# Patient Record
Sex: Male | Born: 1950 | Race: White | Hispanic: No | Smoking: Former smoker
Health system: Southern US, Community
[De-identification: ages and names within clinical notes are randomized; demographics above are authoritative.]

## PROBLEM LIST (undated history)

## (undated) DIAGNOSIS — I441 Atrioventricular block, second degree: Secondary | ICD-10-CM

## (undated) DIAGNOSIS — I779 Disorder of arteries and arterioles, unspecified: Secondary | ICD-10-CM

## (undated) DIAGNOSIS — E785 Hyperlipidemia, unspecified: Secondary | ICD-10-CM

## (undated) DIAGNOSIS — I739 Peripheral vascular disease, unspecified: Secondary | ICD-10-CM

## (undated) DIAGNOSIS — F419 Anxiety disorder, unspecified: Secondary | ICD-10-CM

## (undated) DIAGNOSIS — H269 Unspecified cataract: Secondary | ICD-10-CM

## (undated) DIAGNOSIS — R0602 Shortness of breath: Secondary | ICD-10-CM

## (undated) DIAGNOSIS — I10 Essential (primary) hypertension: Secondary | ICD-10-CM

## (undated) DIAGNOSIS — Z9289 Personal history of other medical treatment: Secondary | ICD-10-CM

## (undated) DIAGNOSIS — M545 Low back pain, unspecified: Secondary | ICD-10-CM

## (undated) DIAGNOSIS — M1990 Unspecified osteoarthritis, unspecified site: Secondary | ICD-10-CM

## (undated) DIAGNOSIS — G5712 Meralgia paresthetica, left lower limb: Secondary | ICD-10-CM

## (undated) DIAGNOSIS — I771 Stricture of artery: Secondary | ICD-10-CM

## (undated) DIAGNOSIS — K219 Gastro-esophageal reflux disease without esophagitis: Secondary | ICD-10-CM

## (undated) DIAGNOSIS — Z860100 Personal history of colon polyps, unspecified: Secondary | ICD-10-CM

## (undated) DIAGNOSIS — I2510 Atherosclerotic heart disease of native coronary artery without angina pectoris: Secondary | ICD-10-CM

## (undated) DIAGNOSIS — G4730 Sleep apnea, unspecified: Secondary | ICD-10-CM

## (undated) DIAGNOSIS — J449 Chronic obstructive pulmonary disease, unspecified: Secondary | ICD-10-CM

## (undated) HISTORY — DX: Personal history of colonic polyps: Z86.010

## (undated) HISTORY — DX: Low back pain, unspecified: M54.50

## (undated) HISTORY — DX: Low back pain: M54.5

## (undated) HISTORY — DX: Meralgia paresthetica, left lower limb: G57.12

## (undated) HISTORY — DX: Personal history of colon polyps, unspecified: Z86.0100

## (undated) HISTORY — DX: Hyperlipidemia, unspecified: E78.5

## (undated) HISTORY — DX: Atherosclerotic heart disease of native coronary artery without angina pectoris: I25.10

## (undated) HISTORY — DX: Unspecified cataract: H26.9

## (undated) HISTORY — DX: Chronic obstructive pulmonary disease, unspecified: J44.9

## (undated) HISTORY — DX: Sleep apnea, unspecified: G47.30

## (undated) HISTORY — DX: Peripheral vascular disease, unspecified: I73.9

## (undated) HISTORY — DX: Disorder of arteries and arterioles, unspecified: I77.9

## (undated) HISTORY — DX: Personal history of other medical treatment: Z92.89

## (undated) HISTORY — PX: TONSILLECTOMY: SUR1361

## (undated) HISTORY — DX: Gastro-esophageal reflux disease without esophagitis: K21.9

## (undated) HISTORY — PX: OTHER SURGICAL HISTORY: SHX169

## (undated) HISTORY — DX: Essential (primary) hypertension: I10

## (undated) HISTORY — DX: Stricture of artery: I77.1

---

## 1998-03-11 ENCOUNTER — Inpatient Hospital Stay (HOSPITAL_COMMUNITY): Admission: AD | Admit: 1998-03-11 | Discharge: 1998-03-13 | Payer: Self-pay | Admitting: *Deleted

## 1998-05-14 ENCOUNTER — Observation Stay (HOSPITAL_COMMUNITY): Admission: EM | Admit: 1998-05-14 | Discharge: 1998-05-15 | Payer: Self-pay | Admitting: *Deleted

## 1999-06-01 ENCOUNTER — Emergency Department (HOSPITAL_COMMUNITY): Admission: EM | Admit: 1999-06-01 | Discharge: 1999-06-01 | Payer: Self-pay | Admitting: Emergency Medicine

## 2000-08-15 ENCOUNTER — Encounter (INDEPENDENT_AMBULATORY_CARE_PROVIDER_SITE_OTHER): Payer: Self-pay | Admitting: Specialist

## 2000-08-15 ENCOUNTER — Other Ambulatory Visit: Admission: RE | Admit: 2000-08-15 | Discharge: 2000-08-15 | Payer: Self-pay | Admitting: Gastroenterology

## 2001-04-03 ENCOUNTER — Inpatient Hospital Stay (HOSPITAL_COMMUNITY): Admission: EM | Admit: 2001-04-03 | Discharge: 2001-04-05 | Payer: Self-pay | Admitting: Emergency Medicine

## 2001-04-03 ENCOUNTER — Encounter: Payer: Self-pay | Admitting: Emergency Medicine

## 2001-04-05 ENCOUNTER — Encounter: Payer: Self-pay | Admitting: Internal Medicine

## 2003-05-05 ENCOUNTER — Emergency Department (HOSPITAL_COMMUNITY): Admission: EM | Admit: 2003-05-05 | Discharge: 2003-05-05 | Payer: Self-pay | Admitting: Emergency Medicine

## 2003-05-09 ENCOUNTER — Ambulatory Visit (HOSPITAL_COMMUNITY): Admission: RE | Admit: 2003-05-09 | Discharge: 2003-05-09 | Payer: Self-pay

## 2004-10-29 ENCOUNTER — Ambulatory Visit: Payer: Self-pay | Admitting: Internal Medicine

## 2005-02-15 ENCOUNTER — Ambulatory Visit: Payer: Self-pay | Admitting: Internal Medicine

## 2005-09-16 ENCOUNTER — Emergency Department (HOSPITAL_COMMUNITY): Admission: EM | Admit: 2005-09-16 | Discharge: 2005-09-17 | Payer: Self-pay | Admitting: Emergency Medicine

## 2005-11-16 ENCOUNTER — Ambulatory Visit: Payer: Self-pay | Admitting: Internal Medicine

## 2006-03-04 ENCOUNTER — Emergency Department (HOSPITAL_COMMUNITY): Admission: EM | Admit: 2006-03-04 | Discharge: 2006-03-04 | Payer: Self-pay | Admitting: Emergency Medicine

## 2006-03-06 ENCOUNTER — Ambulatory Visit: Payer: Self-pay | Admitting: Internal Medicine

## 2006-04-24 ENCOUNTER — Emergency Department (HOSPITAL_COMMUNITY): Admission: EM | Admit: 2006-04-24 | Discharge: 2006-04-24 | Payer: Self-pay | Admitting: Emergency Medicine

## 2006-09-25 ENCOUNTER — Ambulatory Visit: Payer: Self-pay | Admitting: Internal Medicine

## 2006-09-25 LAB — CONVERTED CEMR LAB
ALT: 27 units/L (ref 0–40)
BUN: 13 mg/dL (ref 6–23)
Basophils Absolute: 0 10*3/uL (ref 0.0–0.1)
Basophils Relative: 0.2 % (ref 0.0–1.0)
CO2: 28 meq/L (ref 19–32)
Cholesterol: 166 mg/dL (ref 0–200)
Creatinine, Ser: 0.9 mg/dL (ref 0.4–1.5)
Epithelial cells, urine: NEGATIVE /lpf
Glomerular Filtration Rate, Af Am: 113 mL/min/{1.73_m2}
Glucose, Bld: 101 mg/dL — ABNORMAL HIGH (ref 70–99)
HCT: 51.3 % (ref 39.0–52.0)
Hemoglobin, Urine: NEGATIVE
Hemoglobin: 17.4 g/dL — ABNORMAL HIGH (ref 13.0–17.0)
LDL Cholesterol: 111 mg/dL — ABNORMAL HIGH (ref 0–99)
Lymphocytes Relative: 29.1 % (ref 12.0–46.0)
MCV: 99.9 fL (ref 78.0–100.0)
Monocytes Absolute: 0.5 10*3/uL (ref 0.2–0.7)
Mucus, UA: NEGATIVE
Neutro Abs: 4.9 10*3/uL (ref 1.4–7.7)
Potassium: 4.6 meq/L (ref 3.5–5.1)
RBC / HPF: NONE SEEN
RDW: 12.7 % (ref 11.5–14.6)
Sodium: 140 meq/L (ref 135–145)
Specific Gravity, Urine: 1.02 (ref 1.000–1.03)
TSH: 1.47 microintl units/mL (ref 0.35–5.50)
Total Bilirubin: 1.4 mg/dL — ABNORMAL HIGH (ref 0.3–1.2)
Total Protein, Urine: NEGATIVE mg/dL
Total Protein: 7.2 g/dL (ref 6.0–8.3)
Triglyceride fasting, serum: 118 mg/dL (ref 0–149)
Urobilinogen, UA: 0.2 (ref 0.0–1.0)

## 2006-09-29 ENCOUNTER — Ambulatory Visit: Payer: Self-pay | Admitting: Internal Medicine

## 2006-10-02 ENCOUNTER — Ambulatory Visit (HOSPITAL_COMMUNITY): Admission: RE | Admit: 2006-10-02 | Discharge: 2006-10-02 | Payer: Self-pay | Admitting: Internal Medicine

## 2006-12-08 ENCOUNTER — Ambulatory Visit: Payer: Self-pay | Admitting: Internal Medicine

## 2007-01-11 ENCOUNTER — Ambulatory Visit: Payer: Self-pay | Admitting: Internal Medicine

## 2007-01-17 ENCOUNTER — Ambulatory Visit: Payer: Self-pay | Admitting: Internal Medicine

## 2007-01-22 ENCOUNTER — Ambulatory Visit: Payer: Self-pay | Admitting: Internal Medicine

## 2007-01-25 ENCOUNTER — Ambulatory Visit: Payer: Self-pay | Admitting: Gastroenterology

## 2007-02-08 ENCOUNTER — Encounter (INDEPENDENT_AMBULATORY_CARE_PROVIDER_SITE_OTHER): Payer: Self-pay | Admitting: *Deleted

## 2007-02-08 ENCOUNTER — Ambulatory Visit: Payer: Self-pay | Admitting: Gastroenterology

## 2007-02-08 LAB — HM COLONOSCOPY

## 2007-02-13 ENCOUNTER — Ambulatory Visit: Payer: Self-pay | Admitting: Internal Medicine

## 2007-02-23 ENCOUNTER — Ambulatory Visit: Payer: Self-pay

## 2007-03-16 ENCOUNTER — Ambulatory Visit: Payer: Self-pay | Admitting: Cardiology

## 2007-03-16 LAB — CONVERTED CEMR LAB
BUN: 16 mg/dL (ref 6–23)
Calcium: 9.6 mg/dL (ref 8.4–10.5)
Eosinophils Absolute: 0.3 10*3/uL (ref 0.0–0.6)
Eosinophils Relative: 2.9 % (ref 0.0–5.0)
GFR calc Af Amer: 150 mL/min
GFR calc non Af Amer: 124 mL/min
Lymphocytes Relative: 33.6 % (ref 12.0–46.0)
MCHC: 35.5 g/dL (ref 30.0–36.0)
Monocytes Absolute: 0.8 10*3/uL — ABNORMAL HIGH (ref 0.2–0.7)
Monocytes Relative: 8.8 % (ref 3.0–11.0)
Neutrophils Relative %: 53.8 % (ref 43.0–77.0)
Platelets: 307 10*3/uL (ref 150–400)
Potassium: 4.1 meq/L (ref 3.5–5.1)
Prothrombin Time: 11.7 s (ref 10.0–14.0)
RBC: 4.86 M/uL (ref 4.22–5.81)
RDW: 13.8 % (ref 11.5–14.6)
Sodium: 144 meq/L (ref 135–145)
aPTT: 29.1 s (ref 26.5–36.5)

## 2007-03-21 ENCOUNTER — Ambulatory Visit: Payer: Self-pay | Admitting: Cardiology

## 2007-03-21 ENCOUNTER — Observation Stay (HOSPITAL_COMMUNITY): Admission: RE | Admit: 2007-03-21 | Discharge: 2007-03-22 | Payer: Self-pay | Admitting: Cardiology

## 2007-03-22 ENCOUNTER — Ambulatory Visit: Payer: Self-pay | Admitting: Vascular Surgery

## 2007-03-26 ENCOUNTER — Ambulatory Visit: Payer: Self-pay

## 2007-03-26 ENCOUNTER — Ambulatory Visit: Payer: Self-pay | Admitting: Cardiology

## 2007-07-03 ENCOUNTER — Encounter: Payer: Self-pay | Admitting: Internal Medicine

## 2007-07-03 DIAGNOSIS — Z860100 Personal history of colon polyps, unspecified: Secondary | ICD-10-CM | POA: Insufficient documentation

## 2007-07-03 DIAGNOSIS — F528 Other sexual dysfunction not due to a substance or known physiological condition: Secondary | ICD-10-CM | POA: Insufficient documentation

## 2007-07-03 DIAGNOSIS — R0600 Dyspnea, unspecified: Secondary | ICD-10-CM | POA: Insufficient documentation

## 2007-07-03 DIAGNOSIS — I129 Hypertensive chronic kidney disease with stage 1 through stage 4 chronic kidney disease, or unspecified chronic kidney disease: Secondary | ICD-10-CM | POA: Insufficient documentation

## 2007-07-03 DIAGNOSIS — I2510 Atherosclerotic heart disease of native coronary artery without angina pectoris: Secondary | ICD-10-CM | POA: Insufficient documentation

## 2007-07-03 DIAGNOSIS — I70219 Atherosclerosis of native arteries of extremities with intermittent claudication, unspecified extremity: Secondary | ICD-10-CM | POA: Insufficient documentation

## 2007-07-03 DIAGNOSIS — I10 Essential (primary) hypertension: Secondary | ICD-10-CM | POA: Insufficient documentation

## 2007-09-26 ENCOUNTER — Ambulatory Visit: Payer: Self-pay

## 2008-02-12 ENCOUNTER — Emergency Department (HOSPITAL_COMMUNITY): Admission: EM | Admit: 2008-02-12 | Discharge: 2008-02-12 | Payer: Self-pay | Admitting: Emergency Medicine

## 2008-03-10 ENCOUNTER — Ambulatory Visit: Payer: Self-pay | Admitting: Internal Medicine

## 2008-03-10 DIAGNOSIS — F17200 Nicotine dependence, unspecified, uncomplicated: Secondary | ICD-10-CM | POA: Insufficient documentation

## 2008-03-10 DIAGNOSIS — R5382 Chronic fatigue, unspecified: Secondary | ICD-10-CM | POA: Insufficient documentation

## 2008-03-10 DIAGNOSIS — R5383 Other fatigue: Secondary | ICD-10-CM | POA: Insufficient documentation

## 2008-04-18 ENCOUNTER — Telehealth: Payer: Self-pay | Admitting: Internal Medicine

## 2008-05-01 ENCOUNTER — Ambulatory Visit: Payer: Self-pay | Admitting: Internal Medicine

## 2008-05-01 LAB — CONVERTED CEMR LAB
ALT: 40 units/L (ref 0–53)
AST: 34 units/L (ref 0–37)
BUN: 9 mg/dL (ref 6–23)
Basophils Absolute: 0.1 10*3/uL (ref 0.0–0.1)
Basophils Relative: 0.7 % (ref 0.0–1.0)
Bilirubin Urine: NEGATIVE
CO2: 26 meq/L (ref 19–32)
Chloride: 103 meq/L (ref 96–112)
Cholesterol: 243 mg/dL (ref 0–200)
Creatinine, Ser: 0.8 mg/dL (ref 0.4–1.5)
Eosinophils Absolute: 0.3 10*3/uL (ref 0.0–0.7)
Eosinophils Relative: 3.6 % (ref 0.0–5.0)
Glucose, Bld: 89 mg/dL (ref 70–99)
HCT: 51.5 % (ref 39.0–52.0)
HDL: 29.1 mg/dL — ABNORMAL LOW (ref >39.0)
Hgb A1c MFr Bld: 6 % (ref 4.6–6.0)
Ketones, ur: NEGATIVE mg/dL
Lymphocytes Relative: 33 % (ref 12.0–46.0)
MCHC: 35 g/dL (ref 30.0–36.0)
Neutro Abs: 4.4 10*3/uL (ref 1.4–7.7)
Nitrite: NEGATIVE
Total Bilirubin: 0.9 mg/dL (ref 0.3–1.2)
Total Protein, Urine: NEGATIVE mg/dL
Total Protein: 7.2 g/dL (ref 6.0–8.3)
Urine Glucose: NEGATIVE mg/dL
Urobilinogen, UA: 0.2 (ref 0.0–1.0)
VLDL: 86 mg/dL — ABNORMAL HIGH (ref 0–40)
WBC: 8 10*3/uL (ref 4.5–10.5)
pH: 5.5 (ref 5.0–8.0)

## 2008-05-05 LAB — CONVERTED CEMR LAB
HCV Ab: NEGATIVE
Hep B S Ab: NEGATIVE
Vit D, 1,25-Dihydroxy: 24 — ABNORMAL LOW (ref 30–89)

## 2008-06-24 ENCOUNTER — Ambulatory Visit: Payer: Self-pay | Admitting: Internal Medicine

## 2008-06-24 DIAGNOSIS — B351 Tinea unguium: Secondary | ICD-10-CM | POA: Insufficient documentation

## 2008-06-24 DIAGNOSIS — E559 Vitamin D deficiency, unspecified: Secondary | ICD-10-CM | POA: Insufficient documentation

## 2008-12-08 ENCOUNTER — Telehealth: Payer: Self-pay | Admitting: Internal Medicine

## 2009-11-28 DIAGNOSIS — G5712 Meralgia paresthetica, left lower limb: Secondary | ICD-10-CM

## 2009-11-28 HISTORY — DX: Meralgia paresthetica, left lower limb: G57.12

## 2010-01-06 ENCOUNTER — Encounter (INDEPENDENT_AMBULATORY_CARE_PROVIDER_SITE_OTHER): Payer: Self-pay | Admitting: *Deleted

## 2010-01-12 ENCOUNTER — Encounter: Payer: Self-pay | Admitting: Internal Medicine

## 2010-01-12 ENCOUNTER — Telehealth: Payer: Self-pay | Admitting: Internal Medicine

## 2010-01-13 ENCOUNTER — Ambulatory Visit: Payer: Self-pay | Admitting: Internal Medicine

## 2010-01-13 DIAGNOSIS — R42 Dizziness and giddiness: Secondary | ICD-10-CM | POA: Insufficient documentation

## 2010-02-08 ENCOUNTER — Encounter: Payer: Self-pay | Admitting: Internal Medicine

## 2010-02-11 ENCOUNTER — Ambulatory Visit: Payer: Self-pay | Admitting: Internal Medicine

## 2010-02-11 LAB — CONVERTED CEMR LAB
AST: 24 units/L (ref 0–37)
Albumin: 3.8 g/dL (ref 3.5–5.2)
Basophils Relative: 0.6 % (ref 0.0–3.0)
Bilirubin Urine: NEGATIVE
Bilirubin, Direct: 0.2 mg/dL (ref 0.0–0.3)
Calcium: 9.4 mg/dL (ref 8.4–10.5)
Creatinine, Ser: 0.8 mg/dL (ref 0.4–1.5)
Direct LDL: 138.8 mg/dL
Eosinophils Absolute: 0.2 10*3/uL (ref 0.0–0.7)
Eosinophils Relative: 2.6 % (ref 0.0–5.0)
GFR calc non Af Amer: 105.18 mL/min (ref >60)
Glucose, Bld: 85 mg/dL (ref 70–99)
HCT: 46.3 % (ref 39.0–52.0)
Hemoglobin, Urine: NEGATIVE
Hemoglobin: 16.4 g/dL (ref 13.0–17.0)
Lymphs Abs: 2.7 10*3/uL (ref 0.7–4.0)
Monocytes Absolute: 0.7 10*3/uL (ref 0.1–1.0)
Monocytes Relative: 8.6 % (ref 3.0–12.0)
Nitrite: NEGATIVE
RBC: 4.6 M/uL (ref 4.22–5.81)
TSH: 2.28 microintl units/mL (ref 0.35–5.50)
Total Bilirubin: 0.6 mg/dL (ref 0.3–1.2)
Total CHOL/HDL Ratio: 5
Triglycerides: 241 mg/dL — ABNORMAL HIGH (ref 0.0–149.0)
Urine Glucose: NEGATIVE mg/dL
Urobilinogen, UA: 0.2 (ref 0.0–1.0)
VLDL: 48.2 mg/dL — ABNORMAL HIGH (ref 0.0–40.0)
WBC: 8.3 10*3/uL (ref 4.5–10.5)

## 2010-02-15 ENCOUNTER — Ambulatory Visit: Payer: Self-pay | Admitting: Internal Medicine

## 2010-02-15 DIAGNOSIS — R209 Unspecified disturbances of skin sensation: Secondary | ICD-10-CM | POA: Insufficient documentation

## 2010-05-11 ENCOUNTER — Ambulatory Visit: Payer: Self-pay | Admitting: Internal Medicine

## 2010-09-13 ENCOUNTER — Telehealth: Payer: Self-pay | Admitting: Gastroenterology

## 2010-12-15 ENCOUNTER — Ambulatory Visit
Admission: RE | Admit: 2010-12-15 | Discharge: 2010-12-15 | Payer: Self-pay | Source: Home / Self Care | Attending: Internal Medicine | Admitting: Internal Medicine

## 2010-12-15 ENCOUNTER — Other Ambulatory Visit: Payer: Self-pay | Admitting: Internal Medicine

## 2010-12-15 DIAGNOSIS — E538 Deficiency of other specified B group vitamins: Secondary | ICD-10-CM | POA: Insufficient documentation

## 2010-12-15 DIAGNOSIS — M545 Low back pain, unspecified: Secondary | ICD-10-CM | POA: Insufficient documentation

## 2010-12-15 DIAGNOSIS — G5710 Meralgia paresthetica, unspecified lower limb: Secondary | ICD-10-CM | POA: Insufficient documentation

## 2010-12-15 LAB — CBC WITH DIFFERENTIAL/PLATELET
Basophils Absolute: 0 10*3/uL (ref 0.0–0.1)
Basophils Relative: 0.4 % (ref 0.0–3.0)
Eosinophils Absolute: 0.4 10*3/uL (ref 0.0–0.7)
Eosinophils Relative: 3.6 % (ref 0.0–5.0)
HCT: 47.3 % (ref 39.0–52.0)
Hemoglobin: 16.6 g/dL (ref 13.0–17.0)
Lymphocytes Relative: 29.8 % (ref 12.0–46.0)
Lymphs Abs: 3 10*3/uL (ref 0.7–4.0)
MCHC: 35.1 g/dL (ref 30.0–36.0)
MCV: 101.4 fl — ABNORMAL HIGH (ref 78.0–100.0)
Monocytes Absolute: 0.8 10*3/uL (ref 0.1–1.0)
Monocytes Relative: 8 % (ref 3.0–12.0)
Neutro Abs: 5.9 10*3/uL (ref 1.4–7.7)
Neutrophils Relative %: 58.2 % (ref 43.0–77.0)
Platelets: 232 10*3/uL (ref 150.0–400.0)
RBC: 4.66 Mil/uL (ref 4.22–5.81)
RDW: 13.6 % (ref 11.5–14.6)
WBC: 10.2 10*3/uL (ref 4.5–10.5)

## 2010-12-15 LAB — LIPID PANEL
Cholesterol: 226 mg/dL — ABNORMAL HIGH (ref 0–200)
HDL: 37.7 mg/dL — ABNORMAL LOW (ref >39.00)
Total CHOL/HDL Ratio: 6
Triglycerides: 181 mg/dL — ABNORMAL HIGH (ref 0.0–149.0)
VLDL: 36.2 mg/dL (ref 0.0–40.0)

## 2010-12-15 LAB — HEPATIC FUNCTION PANEL
ALT: 22 U/L (ref 0–53)
AST: 24 U/L (ref 0–37)
Albumin: 3.9 g/dL (ref 3.5–5.2)
Alkaline Phosphatase: 59 U/L (ref 39–117)
Bilirubin, Direct: 0.1 mg/dL (ref 0.0–0.3)
Total Bilirubin: 0.9 mg/dL (ref 0.3–1.2)
Total Protein: 6.4 g/dL (ref 6.0–8.3)

## 2010-12-15 LAB — BASIC METABOLIC PANEL
BUN: 16 mg/dL (ref 6–23)
CO2: 28 mEq/L (ref 19–32)
Calcium: 9.6 mg/dL (ref 8.4–10.5)
Chloride: 104 mEq/L (ref 96–112)
Creatinine, Ser: 1 mg/dL (ref 0.4–1.5)
GFR: 86.01 mL/min (ref >60.00)
Glucose, Bld: 83 mg/dL (ref 70–99)
Potassium: 4.6 mEq/L (ref 3.5–5.1)
Sodium: 139 mEq/L (ref 135–145)

## 2010-12-15 LAB — URINALYSIS
Bilirubin Urine: NEGATIVE
Hemoglobin, Urine: NEGATIVE
Ketones, ur: NEGATIVE
Leukocytes, UA: NEGATIVE
Nitrite: NEGATIVE
Specific Gravity, Urine: 1.025 (ref 1.000–1.030)
Total Protein, Urine: NEGATIVE
Urine Glucose: NEGATIVE
Urobilinogen, UA: 0.2 (ref 0.0–1.0)
pH: 5.5 (ref 5.0–8.0)

## 2010-12-15 LAB — VITAMIN B12: Vitamin B-12: 232 pg/mL (ref 211–911)

## 2010-12-15 LAB — PSA: PSA: 1.02 ng/mL (ref 0.10–4.00)

## 2010-12-15 LAB — LDL CHOLESTEROL, DIRECT: Direct LDL: 160.9 mg/dL

## 2010-12-15 LAB — SEDIMENTATION RATE: Sed Rate: 9 mm/hr (ref 0–22)

## 2010-12-15 LAB — TSH: TSH: 1.81 u[IU]/mL (ref 0.35–5.50)

## 2010-12-28 NOTE — Progress Notes (Signed)
Summary: Schedule Colonoscopy   Phone Note Outgoing Call Call back at Home Phone [phone]   Call placed by: Bernita Buffy CMA Deborra Medina),  September 13, 2010 1:00 PM Call placed to: Patient Summary of Call: patients wife aware he is due and will have him call back she states that they have the number.  Initial call taken by: Bernita Buffy CMA (AAMA),  September 13, 2010 1:00 PM

## 2010-12-28 NOTE — Progress Notes (Signed)
Summary: UC TODAY  Phone Note Call from Patient   Summary of Call: Pt was seen by urgent care for elevated bp. He dropped off office note and EKG. He wants to know if he needs f/u with [facility].  Initial call taken by: Charlsie Quest, [facility],  January 12, 2010 3:04 PM  Follow-up for Phone Call        Yes pls - this wk Follow-up by: Cassandria Anger MD,  January 12, 2010 5:43 PM  Additional Follow-up for Phone Call Additional follow up Details #1::        Pt requests ov today, please let me know. Additional Follow-up by: Denice Paradise,  January 13, 2010 8:41 AM    Additional Follow-up for Phone Call Additional follow up Details #2::    Patient coming in today. Follow-up by: Ernestene Mention,  January 13, 2010 8:48 AM

## 2010-12-28 NOTE — Assessment & Plan Note (Signed)
Summary: CPX/ NWS  #     Vital Signs:  Patient profile:   60 year old male Weight:      165 pounds Temp:     98.7 degrees F oral Pulse rate:   64 / minute BP sitting:   122 / 70  (left arm)  Vitals Entered By: Doralee Albino (February 15, 2010 3:13 PM) CC: cpx Is Patient Diabetic? No   CC:  cpx.  History of Present Illness: The patient presents for a wellness examination  C/o L thigh numbness  Preventive Screening-Counseling & Management  Alcohol-Tobacco     Smoking Status: current  Current Medications (verified): 1)  Benazepril-Hydrochlorothiazide 20-25 Mg Tabs (Benazepril-Hydrochlorothiazide) .Marland Kitchen.. 1 By Mouth Qam 2)  Atenolol 50 Mg  Tabs (Atenolol) .Marland Kitchen.. 1 Once Daily 3)  Viagra 100 Mg Tabs (Sildenafil Citrate) .Marland Kitchen.. 1 By Mouth Once Daily Prn 4)  Aspirin 325 Mg Tabs (Aspirin) .... Take 1 Tab By Mouth Every Day 5)  Multivitamins  Tabs (Multiple Vitamin) .... Once Daily  Allergies (verified): No Known Drug Allergies  Past History:  Past Surgical History: Last updated: 07/03/2007 Stenting of Left superficial artery Stenting of Right superficial femoral artery  Family History: Last updated: 03/10/2008 Family History of CAD Male 1st degree relative <50 Family History Hypertension  Past Medical History: Colonic polyps, hx of COPD Coronary artery disease GERD Hyperlipidemia Hypertension Vit D def M paresthetica L 2011  Family History: Reviewed history from 03/10/2008 and no changes required. Family History of CAD Male 1st degree relative <50 Family History Hypertension  Social History: Occupation: guard and mowing yards Single Current Smoker 1/2 ppd Alcohol use-yes burbon  Review of Systems       The patient complains of dyspnea on exertion.  The patient denies anorexia, fever, weight loss, weight gain, vision loss, decreased hearing, hoarseness, chest pain, syncope, peripheral edema, prolonged cough, headaches, hemoptysis, abdominal pain, melena,  hematochezia, severe indigestion/heartburn, hematuria, incontinence, genital sores, muscle weakness, suspicious skin lesions, transient blindness, difficulty walking, depression, unusual weight change, abnormal bleeding, enlarged lymph nodes, angioedema, and testicular masses.    Physical Exam  General:  Well-developed,well-nourished,in no acute distress; alert,appropriate and cooperative throughout examination Head:  Normocephalic and atraumatic without obvious abnormalities. No apparent alopecia or balding. Eyes:  No corneal or conjunctival inflammation noted. EOMI. Perrla. Ears:  External ear exam shows no significant lesions or deformities.  Otoscopic examination reveals clear canals, tympanic membranes are intact bilaterally without bulging, retraction, inflammation or discharge. Hearing is grossly normal bilaterally. Nose:  Erythematous throat mucosa and intranasal erythema.  Mouth:  as above Neck:  No deformities, masses, or tenderness noted. Chest Wall:  No deformities, masses, tenderness or gynecomastia noted. Lungs:  Normal respiratory effort, chest expands symmetrically. Lungs are clear to auscultation, no crackles or wheezes. Heart:  Normal rate and regular rhythm. S1 and S2 normal without gallop, murmur, click, rub or other extra sounds. Abdomen:  Bowel sounds positive,abdomen soft and non-tender without masses, organomegaly or hernias noted. Rectal:  No external abnormalities noted. Normal sphincter tone. No rectal masses or tenderness. Genitalia:  Testes bilaterally descended without nodularity, tenderness or masses. No scrotal masses or lesions. No penis lesions or urethral discharge. Prostate:  Prostate gland firm and smooth, No nodularity, tenderness, mass, asymmetry or induration.1+ enlarged.   Msk:  No deformity or scoliosis noted of thoracic or lumbar spine.  Stiff Pulses:  R and L carotid,radial,femoral,dorsalis pedis and posterior tibial pulses are full and equal  bilaterally Extremities:  No clubbing, cyanosis, edema,  or deformity noted with normal full range of motion of all joints.   Neurologic:  No cranial nerve deficits noted. Station and gait are normal. Plantar reflexes are down-going bilaterally. DTRs are symmetrical throughout. Sensory, motor and coordinative functions appear intact. There is an oval 30x15 cm area of L lat. thigh w/decreased pinprick sensation Skin:  Tanned. Intact without suspicious lesions or rashes Cervical Nodes:  No lymphadenopathy noted Psych:  Cognition and judgment appear intact. Alert and cooperative with normal attention span and concentration. No apparent delusions, illusions, hallucinations   Impression & Recommendations:  Problem # 1:  WELL ADULT EXAM (ICD-V70.0) Colon is pending  Orders: EKG w/ Interpretation (93000) nl Health and age related issues were discussed. Available screening tests and vaccinations were discussed as well. Healthy life style including good diet and execise was discussed.  The labs were reviewed with the patient.   Problem # 2:  PARESTHESIA (ICD-782.0) L thigh due to M. paresthetica Assessment: New Rx discussed  Problem # 3:  TOBACCO USE DISORDER/SMOKER-SMOKING CESSATION DISCUSSED (ICD-305.1) Assessment: Improved  Encouraged smoking cessation and discussed different methods for smoking cessation.   Problem # 4:  CORONARY ARTERY DISEASE (ICD-414.00) Assessment: Unchanged  His updated medication list for this problem includes:    Benazepril-hydrochlorothiazide 20-25 Mg Tabs (Benazepril-hydrochlorothiazide) .Marland Kitchen... 1 by mouth qam    Atenolol 50 Mg Tabs (Atenolol) .Marland Kitchen... 1 once daily    Aspirin 325 Mg Tabs (Aspirin) .Marland Kitchen... Take 1 tab by mouth every day  Labs Reviewed: Chol: 214 (02/11/2010)   HDL: 39.40 (02/11/2010)   LDL: DEL (05/01/2008)   TG: 241.0 (02/11/2010)  Complete Medication List: 1)  Benazepril-hydrochlorothiazide 20-25 Mg Tabs (Benazepril-hydrochlorothiazide) .Marland Kitchen.. 1 by  mouth qam 2)  Atenolol 50 Mg Tabs (Atenolol) .Marland Kitchen.. 1 once daily 3)  Viagra 100 Mg Tabs (Sildenafil citrate) .Marland Kitchen.. 1 by mouth once daily prn 4)  Aspirin 325 Mg Tabs (Aspirin) .... Take 1 tab by mouth every day 5)  Multivitamins Tabs (Multiple vitamin) .... Once daily 6)  Lipitor 10 Mg Tabs (Atorvastatin calcium) .Marland Kitchen.. 1 by mouth once daily for cholesterol 7)  Vitamin D 1000 Unit Tabs (Cholecalciferol) .Marland Kitchen.. 1 by mouth qd  Patient Instructions: 1)  Can try Bystolic 10 or 20 mg once daily instead of Atenolol 2)  Please schedule a follow-up appointment in 3 months. 3)  BMP prior to visit, ICD-9: 4)  Hepatic Panel prior to visit, ICD-9: 5)  Lipid Panel prior to visit, ICD-9:272.0 6)  Vit B12 782.0 Prescriptions: LIPITOR 10 MG TABS (ATORVASTATIN CALCIUM) 1 by mouth once daily for cholesterol  #90 x 3   Entered and Authorized by:   Cassandria Anger MD   Signed by:   Cassandria Anger MD on 02/15/2010   Method used:   Print then Give to Patient   RxID:   [id number] LIPITOR 10 MG TABS (ATORVASTATIN CALCIUM) 1 by mouth once daily for cholesterol  #30 x 12   Entered and Authorized by:   Cassandria Anger MD   Signed by:   Cassandria Anger MD on 02/15/2010   Method used:   Print then Give to Patient   RxID:   [id number]

## 2010-12-28 NOTE — Letter (Signed)
Summary: [facility] [place] By: Phillis Knack 01/18/2010 07:22:13  _____________________________________________________________________  External Attachment:    Type:   Image     Comment:   External Document

## 2010-12-28 NOTE — Letter (Signed)
Summary: Colonoscopy Letter  [facility]  [place], [place] [address]   Phone: [phone]  Fax: [phone]      January 06, 2010 MRN: [id number]   WYLAND GANTT [address] [place], [place]  [address]   Dear Mr. SOARES,   According to your medical record, it is time for you to schedule a Colonoscopy. The American Cancer Society recommends this procedure as a method to detect early colon cancer. Patients with a family history of colon cancer, or a personal history of colon polyps or inflammatory bowel disease are at increased risk.  This letter has beeen generated based on the recommendations made at the time of your procedure. If you feel that in your particular situation this may no longer apply, please contact our office.  Please call our office at [phone] to schedule this appointment or to update your records at your earliest convenience.  Thank you for cooperating with [place] to provide you with the very best care possible.   Sincerely,  Norberto Sorenson T. Fuller Plan, M.D.  [facility] Gastroenterology Division [phone]

## 2010-12-28 NOTE — Assessment & Plan Note (Signed)
Summary: 2:15 APPT/ELEVATED BP PER MD/KB   Vital Signs:  Patient profile:   60 year old male Height:      64 inches Weight:      168 pounds BMI:     28.94 Temp:     98.5 degrees F oral Pulse rate:   111 / minute BP sitting:   116 / 80  (left arm)  Vitals Entered By: Doralee Albino (January 13, 2010 2:06 PM) CC: elevated bp Is Patient Diabetic? No Comments pt states he is not taking his meds: atenolol, plavix, and zocor.   CC:  elevated bp.  History of Present Illness: C/o HTN, dizziness; felt bad. 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    Physical Exam  General:  Well-developed,well-nourished,in no acute distress; alert,appropriate and cooperative throughout examination Mouth:  Erythematous throat mucosa and intranasal erythema.  Lungs:  Normal respiratory effort, chest expands symmetrically. Lungs are clear to auscultation, no crackles or wheezes. Heart:  Normal rate and regular rhythm. S1 and S2 normal without gallop, click, rub or other extra sounds. 2/6 systolic heart murmur  Abdomen:  Bowel sounds positive,abdomen soft and non-tender without masses, organomegaly or hernias noted. Msk:  Lumbar-sacral spine is tender to palpation over paraspinal muscles and painfull with the ROM  Neurologic:  No cranial nerve deficits noted. Station and gait are normal. Plantar reflexes are  down-going bilaterally. DTRs are symmetrical throughout. Sensory, motor and coordinative functions appear intact. Skin:  Intact without suspicious lesions or rashes Onycho all toenails Psych:  Cognition and judgment appear intact. Alert and cooperative with normal attention span and concentration. No apparent delusions, illusions, hallucinations   Impression & Recommendations:  Problem # 1:  HYPERTENSION (ICD-401.9) Assessment Deteriorated  The following medications were removed from the medication list:    Lotensin Hct 10-12.5 Mg Tabs (Benazepril-hydrochlorothiazide) ..... Once daily His updated medication list for this problem includes:    Benazepril-hydrochlorothiazide 20-25 Mg Tabs (Benazepril-hydrochlorothiazide) .Marland Kitchen... 1 by mouth qam - increased    Atenolol 50 Mg Tabs (Atenolol) .Marland Kitchen... 1 once daily - restart  Problem # 2:  CORONARY ARTERY DISEASE (ICD-414.00) Assessment: Unchanged  The following medications were removed from the medication list:    Lotensin Hct 10-12.5 Mg Tabs (Benazepril-hydrochlorothiazide) ..... Once daily    Plavix 75 Mg Tabs (Clopidogrel bisulfate) ..... Once daily His updated medication list for this problem includes:    Benazepril-hydrochlorothiazide 20-25 Mg Tabs (Benazepril-hydrochlorothiazide) .Marland Kitchen... 1 by mouth qam    Atenolol 50 Mg Tabs (Atenolol) .Marland Kitchen... 1 once daily    Aspirin 325 Mg Tabs (Aspirin) .Marland Kitchen... Take 1 tab by mouth every day  Problem # 3:  HYPERLIPIDEMIA (ICD-272.4) Assessment: Unchanged  His updated medication list for this problem includes:    Zocor 80 Mg Tabs (Simvastatin) ..... Once daily  Problem # 4:  DIZZINESS (ICD-780.4) Assessment: Improved Treat HTN  Complete Medication List: 1)  Benazepril-hydrochlorothiazide 20-25 Mg Tabs (Benazepril-hydrochlorothiazide) .Marland Kitchen.. 1 by mouth qam 2)  Atenolol 50 Mg Tabs (Atenolol) .Marland Kitchen.. 1 once daily 3)  Zocor 80 Mg Tabs (Simvastatin) .... Once daily 4)  Viagra 100 Mg Tabs (Sildenafil citrate) .Marland Kitchen..  1 by mouth once daily prn 5)  Aspirin 325 Mg Tabs (Aspirin) .... Take 1 tab by mouth every day 6)  Vitamin D3 1000 Unit Tabs (Cholecalciferol) .Marland Kitchen.. 1 by mouth daily  Patient Instructions: 1)  Please schedule a follow-up appointment in 1 month well w/labs. Prescriptions: VIAGRA 100 MG TABS (SILDENAFIL CITRATE) 1 by mouth once daily prn  #12 x 6   Entered and Authorized by:   Cassandria Anger MD   Signed by:   Cassandria Anger MD on 01/13/2010   Method used:   Print then Give to Patient   RxID:   [id number] ATENOLOL 50 MG  TABS (ATENOLOL) 1 once daily  #90 x 3   Entered and Authorized by:   Cassandria Anger MD   Signed by:   Cassandria Anger MD on 01/13/2010   Method used:   Print then Give to Patient   RxID:   [id number] BENAZEPRIL-HYDROCHLOROTHIAZIDE 20-25 MG TABS (BENAZEPRIL-HYDROCHLOROTHIAZIDE) 1 by mouth qam  #90 x 3   Entered and Authorized by:   Cassandria Anger MD   Signed by:   Cassandria Anger MD on 01/13/2010   Method used:   Print then Give to Patient   RxID:   [id number]

## 2010-12-30 NOTE — Assessment & Plan Note (Signed)
Summary: FU---STC   Vital Signs:  Patient profile:   60 year old male Height:      64 inches Weight:      170 pounds BMI:     29.29 Temp:     98.5 degrees F oral Pulse rate:   92 / minute Pulse rhythm:   regular Resp:     16 per minute BP sitting:   140 / 80  (left arm) Cuff size:   regular  Vitals Entered By: Jonathon Resides, CMA(AAMA) (December 15, 2010 9:57 AM) CC: LBP and Lt leg numbness and multiple moles to be examined Is Patient Diabetic? No   CC:  LBP and Lt leg numbness and multiple moles to be examined.  History of Present Illness: C/o LBP and numbness on L thigh C/o moles F/u HTN, CAD     Current Medications (verified): 1)  Benazepril-Hydrochlorothiazide 20-25 Mg Tabs (Benazepril-Hydrochlorothiazide) .Marland Kitchen.. 1 By Mouth Qam 2)  Atenolol 50 Mg  Tabs (Atenolol) .Marland Kitchen.. 1 Once Daily 3)  Viagra 100 Mg Tabs (Sildenafil Citrate) .Marland Kitchen.. 1 By Mouth Once Daily Prn 4)  Aspirin 325 Mg Tabs (Aspirin) .... Take 1 Tab By Mouth Every Day 5)  Multivitamins  Tabs (Multiple Vitamin) .... Once Daily 6)  Lipitor 10 Mg Tabs (Atorvastatin Calcium) .Marland Kitchen.. 1 By Mouth Once Daily For Cholesterol 7)  Vitamin D 1000 Unit Tabs (Cholecalciferol) .Marland Kitchen.. 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  The patient denies fever, chest pain, dyspnea on exertion, abdominal pain,  melena, and hematochezia.    Physical Exam  General:  Well-developed,well-nourished,in no acute distress; alert,appropriate and cooperative throughout examination Ears:  External ear exam shows no significant lesions or deformities.  Otoscopic examination reveals clear canals, tympanic membranes are intact bilaterally without bulging, retraction, inflammation or discharge. Hearing is grossly normal bilaterally. Nose:  Erythematous throat mucosa and intranasal erythema.  Mouth:  as above Lungs:  Normal respiratory effort, chest expands symmetrically. Lungs are clear to auscultation, no crackles or wheezes. Heart:  Normal rate and regular rhythm. S1 and S2 normal without gallop, murmur, click, rub or other extra sounds. Abdomen:  Bowel sounds positive,abdomen soft and non-tender without masses, organomegaly or hernias noted. Msk:  No deformity or scoliosis noted of thoracic or lumbar spine.  Stiff Neurologic:  No cranial nerve deficits noted. Station and gait are normal. Plantar reflexes are down-going bilaterally. DTRs are symmetrical throughout. Sensory, motor and coordinative functions appear intact. There is an oval 30x15 cm area of L lat. thigh w/decreased pinprick sensation Skin:  Tanned. Intact without suspicious lesions or rashes Psych:  Cognition and judgment appear intact. Alert and cooperative with normal attention span and concentration. 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 1 by mouth qam    Atenolol 50 Mg Tabs (Atenolol) .Marland Kitchen... 1 once daily    Aspirin 325 Mg Tabs (Aspirin) .Marland Kitchen... Take 1 tab by mouth every day  Problem # 4:  COPD (ICD-496) Assessment: Unchanged  Problem # 5:  LOW BACK PAIN (ICD-724.2) MSK Assessment: Deteriorated  His updated medication list for this problem includes:    Aspirin 325 Mg Tabs (Aspirin) .Marland Kitchen... Take 1 tab by mouth every day  Orders: T-Lumbar Spine 2 Views (72100TC) TLB-B12, Serum-Total ONLY [id number]) TLB-BMP (Basic Metabolic Panel-BMET) (99991111) TLB-CBC Platelet - w/Differential (85025-CBCD) TLB-Hepatic/Liver Function Pnl (80076-HEPATIC) TLB-Lipid Panel (80061-LIPID) TLB-Sedimentation Rate (ESR) (85652-ESR) TLB-TSH (Thyroid Stimulating Hormone) (84443-TSH) TLB-Udip ONLY (81003-UDIP) TLB-PSA (Prostate Specific Antigen) (84153-PSA) Assessment: New  Problem # 7:  TOBACCO USE DISORDER/SMOKER-SMOKING CESSATION DISCUSSED (ICD-305.1) Assessment: New  Encouraged smoking cessation and discussed different methods for smoking cessation.   Complete Medication List: 1)  Benazepril-hydrochlorothiazide 20-25 Mg Tabs (Benazepril-hydrochlorothiazide) .Marland Kitchen.. 1 by mouth qam 2)  Atenolol 50 Mg Tabs (Atenolol) .Marland Kitchen.. 1 once daily 3)  Viagra 100 Mg Tabs (Sildenafil citrate) .Marland Kitchen.. 1 by mouth once daily prn 4)  Aspirin 325 Mg Tabs (Aspirin) .... Take 1 tab by mouth every day 5)  Multivitamins Tabs (Multiple vitamin) .... Once daily 6)  Lipitor 10 Mg Tabs (Atorvastatin calcium) .Marland Kitchen.. 1 by mouth once daily for cholesterol 7)  Vitamin D 1000 Unit Tabs (Cholecalciferol) .Marland Kitchen.. 1 by mouth qd 8)  Prednisone 10  Mg Tabs (Prednisone) .... Take 40mg  qd for 3 days, then 20 mg qd for 3 days, then 10mg  qd for 6 days, then stop. take pc. 9)  Vitamin B-12 1000 Mcg Subl (Cyanocobalamin) .Marland Kitchen.. 1 by mouth qd  Patient Instructions: 1)  Please schedule a follow-up appointment in 3 months well. 2)  Hepatic Panel prior to visit, ICD-9:272.0  414.8 3)  Lipid Panel prior to visit, ICD-9: Prescriptions: VITAMIN B-12 1000 MCG SUBL (CYANOCOBALAMIN) 1 by mouth qd  #100 x 3   Entered and Authorized by:   Cassandria Anger MD   Signed by:   Cassandria Anger MD on 12/15/2010   Method used:   Print then Give to Patient   RxID:   [id number] PREDNISONE 10 MG TABS (PREDNISONE) Take 40mg  qd for 3 days, then 20 mg qd for 3 days, then 10mg  qd for 6 days, then stop. Take pc.  #24 x 1   Entered and Authorized by:   Cassandria Anger MD   Signed by:   Cassandria Anger MD on 12/15/2010   Method used:   Print then Give to Patient   RxID:   [id number] LIPITOR 10 MG TABS (ATORVASTATIN CALCIUM) 1 by mouth once daily for cholesterol  #90 x 3   Entered and Authorized by:   Cassandria Anger MD   Signed by:   Cassandria Anger MD on 12/15/2010   Method used:   Print then Give to Patient   RxID:   [id number] VIAGRA 100 MG TABS (SILDENAFIL CITRATE) 1 by mouth once daily prn  #12 x 6   Entered and Authorized by:   Evie Lacks Plotnikov  MD   Signed by:   Cassandria Anger MD on 12/15/2010   Method used:   Print then Give to Patient   RxID:   [id number] ATENOLOL 50 MG  TABS (ATENOLOL) 1 once daily  #90 x 3   Entered and Authorized by:   Cassandria Anger MD   Signed by:   Cassandria Anger MD on 12/15/2010   Method used:   Print then Give to Patient   RxID:   [id number] BENAZEPRIL-HYDROCHLOROTHIAZIDE 20-25 MG TABS (BENAZEPRIL-HYDROCHLOROTHIAZIDE) 1 by mouth qam  #90 x 3   Entered and Authorized by:   Cassandria Anger MD   Signed by:   Cassandria Anger MD on 12/15/2010   Method  used:   Print then Give to Patient   RxID:   [id number]    Orders Added: 1)  T-Lumbar Spine 2 Views [72100TC] 2)  TLB-B12, Serum-Total ONLY [82607-B12] 3)  TLB-BMP (Basic Metabolic Panel-BMET) 123456 4)  TLB-CBC Platelet - w/Differential [85025-CBCD] 5)  TLB-Hepatic/Liver Function Pnl [80076-HEPATIC] 6)  TLB-Lipid Panel [80061-LIPID] 7)  TLB-Sedimentation Rate (ESR) [85652-ESR] 8)  TLB-TSH (Thyroid Stimulating Hormone) [84443-TSH] 9)  TLB-Udip ONLY [81003-UDIP] 10)  TLB-PSA (Prostate Specific Antigen) [84153-PSA] 11)  Est. Patient Level V [id number]   Immunization History:  Influenza Immunization History:    Influenza:  historical (08/18/2010)   Immunization History:  Influenza Immunization History:    Influenza:  Historical (08/18/2010)

## 2011-01-24 ENCOUNTER — Telehealth: Payer: Self-pay | Admitting: Internal Medicine

## 2011-01-27 ENCOUNTER — Encounter: Payer: Self-pay | Admitting: Internal Medicine

## 2011-01-27 ENCOUNTER — Ambulatory Visit (INDEPENDENT_AMBULATORY_CARE_PROVIDER_SITE_OTHER): Payer: 59 | Admitting: Internal Medicine

## 2011-01-27 DIAGNOSIS — I739 Peripheral vascular disease, unspecified: Secondary | ICD-10-CM

## 2011-01-27 DIAGNOSIS — IMO0002 Reserved for concepts with insufficient information to code with codable children: Secondary | ICD-10-CM

## 2011-01-27 DIAGNOSIS — I10 Essential (primary) hypertension: Secondary | ICD-10-CM

## 2011-01-27 DIAGNOSIS — M5416 Radiculopathy, lumbar region: Secondary | ICD-10-CM | POA: Insufficient documentation

## 2011-02-03 ENCOUNTER — Other Ambulatory Visit: Payer: Self-pay | Admitting: Neurosurgery

## 2011-02-03 DIAGNOSIS — M545 Low back pain, unspecified: Secondary | ICD-10-CM

## 2011-02-03 NOTE — Assessment & Plan Note (Signed)
Summary: LOWER BACK PAIN/ NWS   Vital Signs:  Patient profile:   60 year old male Height:      65 inches Weight:      169.50 pounds BMI:     28.31 O2 Sat:      95 % on Room air Temp:     99.2 degrees F oral Pulse rate:   64 / minute BP sitting:   118 / 62  (left arm) Cuff size:   regular  Vitals Entered By: Shirlean Mylar Ewing CMA ([place]) (January 27, 2011 3:12 PM)  O2 Flow:  Room air CC: Back pain/RE   CC:  Back pain/RE.  History of Present Illness: here to f/u with LBP - gradually worse since last vist despite the prednisone;  no with mod to severe pain, constant, left lower back and midline, radiates to the left foot assoc with numbness, but only ? mild weakness as it seems to affect his walking; nothing seems to make better or worse such as sitting, standing;  no bowel or bladder change;  no fever, wt loss and no prior hx of similar back problems.   Recent plain films neg for acute, and prednisone course no help.  Pt denies CP, worsening sob, doe, wheezing, orthopnea, pnd, worsening LE edema, palps, dizziness or syncope  Pt denies new neuro symptoms such as headache, facial or extremity weakness  Pt denies polydipsia, polyuria   Overall good compliance with meds, trying to follow low chol diet, wt stable, little excercise however  No claudication type symptoms to LE relative to his LE vascular dz and metal stents  Preventive Screening-Counseling & Management      Drug Use:  no.    Problems Prior to Update: 1)  Lumbar Radiculopathy, Left  (ICD-724.4) 2)  Vitamin B12 Deficiency  (ICD-266.2) 3)  Low Back Pain  (ICD-724.2) 4)  Meralgia Paresthetica  (ICD-355.1) 5)  Paresthesia  (ICD-782.0) 6)  Dizziness  (ICD-780.4) 7)  Onychomycosis  (ICD-110.1) 8)  Well Adult Exam  (ICD-V70.0) 9)  Coronary Artery Disease  (ICD-414.00) 10)  Vitamin D Deficiency  (ICD-268.9) 11)  Fatigue  (ICD-780.79) 12)  Arterial Stenosis  (ICD-447.1) 13)  Erectile Dysfunction  (ICD-302.72) 14)  Hypertension   (ICD-401.9) 15)  Hyperlipidemia  (ICD-272.4) 16)  Gerd  (ICD-530.81) 17)  COPD  (ICD-496) 18)  Colonic Polyps, Hx of  (ICD-V12.72) 19)  Tobacco Use Disorder/smoker-smoking Cessation Discussed  (ICD-305.1) 20)  Contact/exposure To Other Communicable Diseases  (ICD-V01.89) 21)  Family History of Cad Male 1st Degree Relative <50  (ICD-V17.3)  Medications Prior to Update: 1)  Benazepril-Hydrochlorothiazide 20-25 Mg Tabs (Benazepril-Hydrochlorothiazide) .Marland Kitchen.. 1 By Mouth Qam 2)  Atenolol 50 Mg  Tabs (Atenolol) .Marland Kitchen.. 1 Once Daily 3)  Viagra 100 Mg Tabs (Sildenafil Citrate) .Marland Kitchen.. 1 By Mouth Once Daily Prn 4)  Aspirin 325 Mg Tabs (Aspirin) .... Take 1 Tab By Mouth Every Day 5)  Multivitamins  Tabs (Multiple Vitamin) .... Once Daily 6)  Lipitor 10 Mg Tabs (Atorvastatin Calcium) .Marland Kitchen.. 1 By Mouth Once Daily For Cholesterol 7)  Vitamin D 1000 Unit Tabs (Cholecalciferol) .Marland Kitchen.. 1 By Mouth Qd 8)  Prednisone 10 Mg Tabs (Prednisone) .... Take 40mg  Qd For 3 Days, Then 20 Mg Qd For 3 Days, Then 10mg  Qd For 6 Days, Then Stop. Take Pc. 9)  Vitamin B-12 1000 Mcg Subl (Cyanocobalamin) .Marland Kitchen.. 1 By Mouth Qd  Current Medications (verified): 1)  Benazepril-Hydrochlorothiazide 20-25 Mg Tabs (Benazepril-Hydrochlorothiazide) .Marland Kitchen.. 1 By Mouth Qam 2)  Atenolol 50 Mg  Tabs (Atenolol) .Marland Kitchen.. 1 Once Daily 3)  Viagra 100 Mg Tabs (Sildenafil Citrate) .Marland Kitchen.. 1 By Mouth Once Daily Prn 4)  Aspirin 325 Mg Tabs (Aspirin) .... Take 1 Tab By Mouth Every Day 5)  Multivitamins  Tabs (Multiple Vitamin) .... Once Daily 6)  Lipitor 10 Mg Tabs (Atorvastatin Calcium) .Marland Kitchen.. 1 By Mouth Once Daily For Cholesterol 7)  Vitamin D 1000 Unit Tabs (Cholecalciferol) .Marland Kitchen.. 1 By Mouth Qd 8)  Vitamin B-12 1000 Mcg Subl (Cyanocobalamin) .Marland Kitchen.. 1 By Mouth Qd 9)  Tramadol Hcl 100 Mg Xr24h-Tab (Tramadol Hcl) .Marland Kitchen.. 1 - 2 By Mouth Once Daily As Needed Pain  Allergies (verified): No Known Drug Allergies  Past History:  Past Surgical History: Last updated:  07/03/2007 Stenting of Left superficial artery Stenting of Right superficial femoral artery  Social History: Last updated: 01/27/2011 Occupation: guard and mowing yards Current Smoker 1/2 ppd Alcohol use-yes burbon Widow/Widower 2010 Drug use-no  Risk Factors: Smoking Status: current (02/15/2010) Packs/Day: less than 1 ppd (07/03/2007)  Past Medical History: Colonic polyps, hx of COPD Coronary artery disease GERD Hyperlipidemia Hypertension Vit D def M paresthetica L 2011 Low back pain Peripheral vascular disease  Social History: Occupation: guard and mowing yards Current Smoker 1/2 ppd Alcohol use-yes burbon Widow/Widower 2010 Drug use-no Drug Use:  no  Review of Systems       all otherwise negative per pt -    Physical Exam  General:  Well-developed,well-nourished,in no acute distress; alert,appropriate and cooperative throughout examination Head:  Normocephalic and atraumatic without obvious abnormalities. No apparent alopecia or balding. Eyes:  No corneal or conjunctival inflammation noted. EOMI. Perrla. Ears:  R ear normal and L ear normal.   Nose:  no external deformity and no nasal discharge.   Mouth:  no gingival abnormalities and pharynx pink and moist.   Neck:  supple and no masses.   Lungs:  Normal respiratory effort, chest expands symmetrically. Lungs are clear to auscultation, no crackles or wheezes. Heart:  Normal rate and regular rhythm. S1 and S2 normal without gallop, murmur, click, rub or other extra sounds. Abdomen:  soft, non-tender, and normal bowel sounds.   Msk:  no joint tenderness and no joint swelling.  and no spine tender or paravertebral tender Pulses:  1+ bilat dorsalis pedis Extremities:  No clubbing, cyanosis, edema, or deformity noted with normal full range of motion of all joints.   Neurologic:  No cranial nerve deficits noted. Station and gait are normal. Plantar reflexes are down-going bilaterally. DTRs are symmetrical  throughout. Sensory, motor and coordinative functions appear intact.   Impression & Recommendations:  Problem # 1:  LUMBAR RADICULOPATHY, LEFT (ICD-724.4)  His updated medication list for this problem includes:    Aspirin 325 Mg Tabs (Aspirin) .Marland Kitchen... Take 1 tab by mouth every day    Tramadol Hcl 100 Mg Xr24h-tab (Tramadol hcl) .Marland Kitchen... 1 - 2 by mouth once daily as needed pain  Orders: Neurosurgeon Referral (Neurosurgeon)  symptomatically worse, exam benign but pain severe and unresponsive to conservatvie tx so far;  pt with vascular metal stents - not candidate for MRI, will refer NS fo further eval and tx   Problem # 2:  HYPERTENSION (ICD-401.9)  His updated medication list for this problem includes:    Benazepril-hydrochlorothiazide 20-25 Mg Tabs (Benazepril-hydrochlorothiazide) .Marland Kitchen... 1 by mouth qam    Atenolol 50 Mg Tabs (Atenolol) .Marland Kitchen... 1 once daily  BP today: 118/62 Prior BP: 140/80 (12/15/2010)  Labs Reviewed: K+: 4.6 (12/15/2010) Creat: : 1.0 (12/15/2010)  Chol: 226 (12/15/2010)   HDL: 37.70 (12/15/2010)   LDL: DEL (05/01/2008)   TG: 181.0 (12/15/2010) stable overall by hx and exam, ok to continue meds/tx as is   Problem # 3:  PERIPHERAL VASCULAR DISEASE (ICD-443.9) stable overall by hx and exam, ok to continue meds/tx as is   Complete Medication List: 1)  Benazepril-hydrochlorothiazide 20-25 Mg Tabs (Benazepril-hydrochlorothiazide) .Marland Kitchen.. 1 by mouth qam 2)  Atenolol 50 Mg Tabs (Atenolol) .Marland Kitchen.. 1 once daily 3)  Viagra 100 Mg Tabs (Sildenafil citrate) .Marland Kitchen.. 1 by mouth once daily prn 4)  Aspirin 325 Mg Tabs (Aspirin) .... Take 1 tab by mouth every day 5)  Multivitamins Tabs (Multiple vitamin) .... Once daily 6)  Lipitor 10 Mg Tabs (Atorvastatin calcium) .Marland Kitchen.. 1 by mouth once daily for cholesterol 7)  Vitamin D 1000 Unit Tabs (Cholecalciferol) .Marland Kitchen.. 1 by mouth qd 8)  Vitamin B-12 1000 Mcg Subl (Cyanocobalamin) .Marland Kitchen.. 1 by mouth qd 9)  Tramadol Hcl 100 Mg Xr24h-tab (Tramadol hcl)  .Marland Kitchen.. 1 - 2 by mouth once daily as needed pain  Patient Instructions: 1)  Please take all new medications as prescribed - the pain medication 2)  You will be contacted about the referral(s) to: Neurosurgury asap 3)  Continue all previous medications as before this visit  4)  please follow lower cholesterol diet 5)  Please schedule an appointment with your primary doctor as needed Prescriptions: TRAMADOL HCL 100 MG XR24H-TAB (TRAMADOL HCL) 1 - 2 by mouth once daily as needed pain  #60 x 1   Entered and Authorized by:   Biagio Borg MD   Signed by:   Biagio Borg MD on 01/27/2011   Method used:   Electronically to        [facility] # [phone]* (retail)       [address]       [place], [place]  [address]       Ph: [phone]       Fax: [phone]   RxID:   [id number]    Orders Added: 1)  Neurosurgeon Referral [Neurosurgeon] 2)  Est. Patient Level IV [id number]

## 2011-02-03 NOTE — Progress Notes (Signed)
Summary: LBP  Phone Note Call from Patient Call back at Work Phone [phone]   Summary of Call: Pt was given prednisone for lbp. This and otc med have given him no relief in his pain. Patient is requesting further advisement from MD.  Initial call taken by: Charlsie Quest, [place],  January 24, 2011 9:54 AM  Follow-up for Phone Call        ov with any MD Follow-up by: Cassandria Anger MD,  January 24, 2011 6:34 PM  Additional Follow-up for Phone Call Additional follow up Details #1::        Left detailed vm on pt's hm # Additional Follow-up by: Charlsie Quest, [place],  January 24, 2011 6:39 PM

## 2011-02-09 ENCOUNTER — Ambulatory Visit
Admission: RE | Admit: 2011-02-09 | Discharge: 2011-02-09 | Disposition: A | Discharge status: Home / Self Care | Payer: 59 | Source: Ambulatory Visit | Attending: Neurosurgery | Admitting: Neurosurgery

## 2011-02-09 DIAGNOSIS — M545 Low back pain, unspecified: Secondary | ICD-10-CM

## 2011-02-18 ENCOUNTER — Other Ambulatory Visit: Payer: Self-pay | Admitting: Internal Medicine

## 2011-03-10 ENCOUNTER — Other Ambulatory Visit: Payer: Self-pay | Admitting: Internal Medicine

## 2011-03-10 ENCOUNTER — Other Ambulatory Visit: Payer: Self-pay

## 2011-03-10 DIAGNOSIS — E7800 Pure hypercholesterolemia, unspecified: Secondary | ICD-10-CM

## 2011-03-10 DIAGNOSIS — I2589 Other forms of chronic ischemic heart disease: Secondary | ICD-10-CM

## 2011-03-16 ENCOUNTER — Ambulatory Visit: Payer: Self-pay | Admitting: Internal Medicine

## 2011-04-04 ENCOUNTER — Emergency Department (HOSPITAL_COMMUNITY): Payer: 59

## 2011-04-04 ENCOUNTER — Observation Stay (HOSPITAL_COMMUNITY)
Admission: EM | Admit: 2011-04-04 | Discharge: 2011-04-05 | Disposition: A | Discharge status: Home / Self Care | Payer: 59 | Attending: Emergency Medicine | Admitting: Emergency Medicine

## 2011-04-04 DIAGNOSIS — R079 Chest pain, unspecified: Principal | ICD-10-CM | POA: Insufficient documentation

## 2011-04-04 LAB — CBC
Hemoglobin: 16.7 g/dL (ref 13.0–17.0)
Platelets: 192 10*3/uL (ref 150–400)

## 2011-04-04 LAB — BASIC METABOLIC PANEL
BUN: 23 mg/dL (ref 6–23)
Chloride: 100 mEq/L (ref 96–112)
GFR calc non Af Amer: >60 mL/min (ref >60)
Sodium: 134 mEq/L — ABNORMAL LOW (ref 135–145)

## 2011-04-04 LAB — DIFFERENTIAL
Lymphs Abs: 2.9 10*3/uL (ref 0.7–4.0)
Monocytes Absolute: 0.8 10*3/uL (ref 0.1–1.0)
Neutro Abs: 5.1 10*3/uL (ref 1.7–7.7)
Neutrophils Relative %: 56 % (ref 43–77)

## 2011-04-05 DIAGNOSIS — R079 Chest pain, unspecified: Secondary | ICD-10-CM

## 2011-04-05 DIAGNOSIS — R0602 Shortness of breath: Secondary | ICD-10-CM

## 2011-04-05 LAB — CK TOTAL AND CKMB (NOT AT ARMC)
Relative Index: INVALID (ref 0.0–2.5)
Total CK: 73 U/L (ref 7–232)

## 2011-04-05 LAB — POCT CARDIAC MARKERS
CKMB, poc: <1 ng/mL — ABNORMAL LOW (ref 1.0–8.0)
CKMB, poc: <1 ng/mL — ABNORMAL LOW (ref 1.0–8.0)
Myoglobin, poc: 42.4 ng/mL (ref 12–200)
Troponin i, poc: <0.05 ng/mL (ref 0.00–0.09)

## 2011-04-06 NOTE — Consult Note (Addendum)
[name], Douglas Christensen                   ACCOUNT NO.:  [id number]  MEDICAL RECORD NO.:  [id number]           PATIENT TYPE:  O  LOCATION:  [place]                         FACILITY:  [facility]  PHYSICIAN:  Damone Fancher C. Kaitland Lewellyn, MD, FACCDATE OF BIRTH:  1951/03/18  DATE OF CONSULTATION:  04/04/2011 DATE OF DISCHARGE:  04/05/2011                                CONSULTATION   PRIMARY CARDIOLOGIST:  New.  PRIMARY MEDICAL DOCTOR:  Biagio Borg, MD  CHIEF COMPLAINT:  Dyspnea and left shoulder pain.  HISTORY OF PRESENT ILLNESS:  Douglas Christensen is a 60 year old gentleman with history of PAD, dyslipidemia, hypertension, and tobacco abuse who presented to [facility] with acute onset of shortness of breath and dull left shoulder pain while lying down this morning.  It approximately lasted 30 minutes and was relieved shortly after arriving in the ED.  He had taken an aspirin prior to leaving home, which may have been an alleviating factor.  His blood pressure was on the lower side, so he did not get nitroglycerin in the ER.  He denies any dizziness, nausea, vomiting.  He has no history of similar symptoms and no exertional symptoms, with exception of mild dyspnea on exertion.  Cardiac enzymes were negative x3 and EKG is without acute changes.  PAST MEDICAL HISTORY: 1. Peripheral arterial disease status post stenting left common iliac     artery and right superficial femoral artery in 2008. 2. Dyslipidemia. 3. Hypertension. 4. Tobacco abuse. 5. Left lumbar radiculopathy. 6. Erectile dysfunction. 7. COPD. 8. History of syncope. 9. Vertigo. 10.Prior catheterization at [facility], unknown date, mild stenosis     per patient.  MEDICATIONS: 1. Benazepril/hydrochlorothiazide 20/25 mg daily. 2. Atenolol 50 mg daily. 3. Lipitor 10 mg daily. 4. Aspirin. 5. Multivitamin. 6. Vitamin B12 1000 mcg daily.  ALLERGIES:  No known drug allergies.  SOCIAL HISTORY:  Douglas Christensen lives in [place] by himself.  He  is a Fish farm manager.  He is single with 2 children.  He has 60-pack-year history of tobacco abuse and drinks 1-2 glasses of bourbon and coke a day.  He denies illicit drug use.  He does not exercise regularly.  FAMILY HISTORY:  No history of CAD or stroke in the siblings.  He has a history of hypertension in his mother and dementia in his father who died at 66.  REVIEW OF SYSTEMS:  Positive for night sweats, no headaches, rash, chest pain with exertion, edema, palpitations, syncope, hematuria, frequency, dysuria, nausea, vomiting, diarrhea.  He does get left leg numbness occasionally.  All other systems reviewed and otherwise negative.  LABORATORY DATA:  WBC is 9.2, hemoglobin 16.7, hematocrit 45.2, platelet count 192.  Sodium 134, potassium 3.2, chloride 100, CO2 of 20, glucose 131, BUN 23, creatinine 0.73.  Cardiac enzymes negative x3.  Chest x-ray showed stable bronchial thickening bilaterally.  EKG normal sinus rhythm with right axis deviation, no acute findings.  PHYSICAL EXAMINATION:  VITAL SIGNS:  Temperature 97.5, pulse 73, respirations 18, blood pressure 129/66, pulse ox 99% on room air. GENERAL:  This is an obese well-appearing male  in no acute distress. HEENT:  Normocephalic and atraumatic.  Extraocular movements intact. Clear sclerae.  Nares without discharge. NECK:  Supple without carotid bruit or JVD. HEART:  Auscultation of the heart reveals regular rate and rhythm with S1 and S2 without murmurs, rubs, or gallops. LUNGS:  Clear to auscultation bilaterally without wheezes, rales, or rhonchi.  He has a 1-cm nevus on his back. ABDOMEN:  Soft, nontender, nondistended.  Positive bowel sounds.  No rebound or guarding. EXTREMITIES:  Warm and dry without edema.  2+ pedal pulses bilaterally. NEUROLOGIC:  He is alert and oriented x3.  Responds to questions appropriately.  Has a normal affect.  ASSESSMENT/PLAN:  The patient was seen and examined by Dr.  Verl Blalock and myself in the ED.  This is a 60 year old gentleman with a history of peripheral artery disease, dyslipidemia, hypertension, tobacco abuse who presents with an episode of chest discomfort today, without objective evidence for ischemia at this time.  He does not get any exertional symptoms and cardiac enzymes have been negative x3.  At this time, we would like to focus on risk-factor modification, including tobacco cessation under continuing care of his primary care doctor.  He has expressed interest in Chantix and was given a prescription with instructions to make sure he takes it under the monitoring/advice of his PCP. His blood sugar is somewhat elevated in ED and he should be followed for that.  He was educated on risk-factor modification, as well as his high risk of developing CAD along with warning signs to watch out for and how to report them.  He will also be given 40 mEq of potassium prior to discharge as well.  He will follow up with Dr. Verl Blalock as needed.    Dayna Dunn, P.A.C.   ______________________________ Marijo Conception Verl Blalock, MD, [facility]   DD/MEDQ  D:  04/05/2011  T:  04/06/2011  Job:  [id number]  cc:   Biagio Borg, MD  Electronically Signed by Melina Copa  on 04/06/2011 04:26:05 PM Electronically Signed by Jenell Milliner MD [facility] on 04/19/2011 07:59:02 AM

## 2011-04-15 NOTE — Progress Notes (Signed)
[place]                        PERIPHERAL VASCULAR OFFICE NOTE   NAME:JONESCainan, Esteves                          MRN:          [id number]  DATE:03/26/2007                            DOB:          [birth year]    PRIMARY CARE PHYSICIAN:  Dr. Cathlean Cower.   HISTORY OF PRESENT ILLNESS:  Mr. Bartow is a 60 year old gentleman with  hypertension and tobacco abuse who presented with bilateral calf  claudication. I took him to angiography a week ago and found him to have  severe left common iliac artery stenosis and long segment occlusion of  the right superficial femoral artery. I treated both the left iliac and  the right SFA with stenting. He has had substantial pain in his right  thigh after the stenting procedure. We confirmed patency of the profunda  while he was in the hospital by duplex. He has continued to complain. He  has also complained of some mild edema in that leg. His claudication is  completely resolved bilaterally. I obtained a duplex ultrasound of his  leg today in our office which shows no evidence of deep vein or  superficial vein thrombus. ABIs have improved from 0.67 on the right and  0.83 on the left to 1.1 bilaterally.   CURRENT MEDICATIONS:  1. Aspirin 325 mg daily.  2. Lotensin HCT 10/12.5.  3. Atenolol 50 mg daily.  4. Zocor 80 mg daily.  5. Plavix 75 mg daily.   PHYSICAL EXAMINATION:  GENERAL:  He is generally well-appearing in no  distress.  VITAL SIGNS:  Heart rate 60, blood pressure 120/70, weight 159 pounds.  NECK:  He has no jugular venous distention, thyromegaly or  lymphadenopathy.  LUNGS:  Clear to auscultation.  CARDIAC:  He has a nondisplaced point of maximal cardiac impulse. There  is a regular rate and rhythm without murmurs, rubs or gallops.  ABDOMEN:  Soft, nondistended, nontender. There is no hepatosplenomegaly.  Bowel sounds are normal.  EXTREMITIES:  Warm without clubbing, cyanosis, edema or ulceration.  Carotid pulses 2+ bilaterally without bruit. Femoral pulses 2+  bilaterally without bruit. Bilateral DP pulses are 2+, left PT is 2+.   IMPRESSION/RECOMMENDATIONS:  1. Leg pain:  Appears to be due to some stretch of the vessel. No      evidence of arterial insufficiency and no evidence of deep venous      thrombosis. Continue nocturnal narcotics. I told him it is fine to      return to work.  2. Tobacco abuse:  Strongly advised him to go ahead and quit. He says      he aims to do so soon. He is willing to try Chantix which we have      prescribed.  3. Will need very close surveillance of the right superficial femoral      artery stent due to severe substantial recoil within the stented      segment.  4. 50% left renal artery stenosis:  Follow renal function and blood      pressure over time.   I will plan on seeing him back  in a month.     Ethelle Lyon, MD  Electronically Signed    WED/MedQ  DD: 03/26/2007  DT: 03/26/2007  Job #: [id number]   cc:   Biagio Borg, MD

## 2011-04-15 NOTE — Assessment & Plan Note (Signed)
[facility]                             PRIMARY CARE OFFICE NOTE   NAME:Douglas Christensen, Douglas Christensen                          MRN:          [id number]  DATE:09/29/2006                            DOB:          Jul 24, 1951    The patient is a 60 year old male who presents for wellness examination.   ALLERGIES:  None.   Past medical history, family history, social history as per October 17, 2003 notes.  He continues employment as a Corporate treasurer.  Current  medicines reviewed.   REVIEW OF SYSTEMS:  Occasional dizziness, very episodic and mild.  Continues  to smoke some daily, trying to quit.  Denies chest pain.  Complains of a lot  of back pain and arthritis especially after a day up on his feet.  The rest  is negative.   PHYSICAL EXAMINATION:  Blood pressure 116/74, pulse 72, temperature 98.8,  weight 113 pounds.  HEENT:  Erythematous mucosa.  Neck supple.  No thyromegaly or bruit.  LUNGS:  Clear.  No wheezes.  HEART:  S1-S2.  No gallop.  ABDOMEN:  Soft and nontender.  No organomegaly, no masses felt.  EXTREMITIES:  Lower extremities without edema.  RECTAL:  Slightly enlarged prostate.  No nodules, no masses.  Stool guaiac  negative.  NEUROLOGICAL:  He is alert, oriented and cooperative.  Denies being  depressed.   LS spine without deformities, tender with range of motion.  Labs on September 25, 2006:  CBC with hemoglobin 17.4, LDL 111.  Other labs are reviewed with  the patient.  EKG today with heart rate 69, normal sinus rhythm unchanged  from previous assessment and plan.   ASSESSMENT/PLAN:  Normal wellness examination.  Age/health-related issues  discussed.  Healthy lifestyle discussed.  Needs to stop smoking completely.  Pneumovax given.  Schedule colonoscopy with Dr. Fuller Plan to follow up on his  polyps.    ______________________________  Evie Lacks. Plotnikov, MD    AVP/MedQ  DD: 10/03/2006  DT: 10/03/2006  Job #: [id number]

## 2011-04-15 NOTE — Op Note (Signed)
[name], Christensen                   ACCOUNT NO.:  [id number]   MEDICAL RECORD NO.:  [id number]          PATIENT TYPE:  INP   LOCATION:  6529                         FACILITY:  [facility]   PHYSICIAN:  Douglas Lyon, MD  DATE OF BIRTH:  March 04, 1951   DATE OF PROCEDURE:  03/21/2007  DATE OF DISCHARGE:                               OPERATIVE REPORT   PROCEDURE:  Abdominal aortography with bilateral lower extremity runoff,  stenting of the left common iliac artery via ipsilateral approach,  stenting of the right superficial femoral artery via contralateral  approach.   INDICATION:  [name] is a 60 year old gentleman with atherosclerotic  risk factors of long standing tobacco abuse and hypertension.  He  presented with three years of lifestyle limiting claudication.  He is  limited to walking less than 200 yards of level ground.  He has never  had any tissue loss.  ABIs are 0.67 on the right and 0.83 on the left.  Duplex was suggestive of long segment occlusion of the right SFA and  severe stenosis versus occlusion of the left iliac artery.  Due to his  lifestyle limiting claudication which has failed conservative therapy,  he presents for angiography with an eye to percutaneous  revascularization.   PROCEDURE TECHNIQUE:  Informed consent was obtained.  Under 1% lidocaine  local anesthesia, a 5-French sheath was placed in the right common  femoral artery using the modified Seldinger technique.  An injection via  the sheath demonstrated a 95% stenosis in the left common iliac artery.  A Wholey wire was manipulated across this without difficulty.  I then  advanced a pigtail catheter into the suprarenal abdominal aorta.  Abdominal aortography was performed by power injection.  The catheter  was then pulled back to the infrarenal abdominal aorta.  Abdominal  aortography with bilateral lower extremity runoff was performed using  power injection and step table technique.  These images  confirmed severe  stenosis of the left common iliac artery and a long segment occlusion of  the right superficial femoral artery.  We decided proceed with  percutaneous revascularization of both.   Anticoagulation was initiated with heparin to initially achieve an ACT  of greater than 250 seconds.  We turned attention first to the left  common iliac artery stenosis.  I exchanged over wire for a 6-French  Terumo glide sheath.  I then directly stented the lesion using a an 8 x  24 mm Genesis stent deployed at 10 atmospheres.  I post dilated the  stent using a 7 x 20 mm Powerflex balloon deployed at 16 atmospheres.  Final angiography demonstrated improvement in the stenosis from 95% to  10%.  There was no dissection and normal flow distally.   Attention was then turned to the right leg.  A crossover catheter was  used to engage the right common iliac artery.  A Wholey wire was  advanced into the common femoral.  I then advance the Terumo glide  sheath over its dilator into the left common femoral artery.  I then  advanced an angled Glidewire  with an end-hole catheter behind it across  the occlusion of the proximal superficial femoral artery.  The catheter  would not advance across the full segment of occlusion and so we  exchanged for a microguide catheter.  With this in place, we were able  to traverse the segment.  After being unable to enter the true lumen  with the angled Glidewire, I used a Confienza wire.  With this, I was  able to cross into the true lumen.  I advanced the microguide catheter  over it into the proximal popliteal artery.  I confirmed intraluminal  position first by aspiration of blood and second by gentle injection of  contrast via the catheter.   We then proceeded to balloon angioplasty.  I exchanged for a Rosen wire.  With the [facility] wire positioned in the distal popliteal, I then began  dilating the segment of occlusion using a 5 x 100 mm Agiltrack balloon  for a  total of three inflations all at 6 atmospheres.  I then began  stenting in the proximal popliteal using an 8 x 120 mm Xceed stent.  I  then overlap an 8 x 100 mm Xceed stent on the proximal end and then a  third 8 x 60 mm Xceed stent in the most proximal end.  The proximal edge  of the stent ends approximately 5 mm below the origin of the SFA and did  not impinge on the profunda.  I then proceeded to post dilation of the  entirety of the stented segment using first a 7 x 20 mm Powerflex  balloon to assess size.  I then proceeded to further post dilation of  the entire segment using a 7 x 100 mm Agiltrack balloon at 10  atmospheres for multiple inflations.  He had substantial pain with these  inflations.  However, there was severe recoil, particularly in the  proximal half of the stent.  I then proceeded to additional post  dilation at higher pressure using a 6 x 40 mm Powerflex throughout the  proximal half of the stented segment for three inflations, all at 15  atmospheres.  Though these balloons expanded nicely, there was  substantial recoil.  I then removed the wire.  We performed a repeat  runoff of the leg which demonstrated residual stenosis of 30-40% in some  of the stented segments.  There was normal flow distally and no evidence  of distal embolization.  The patient was then transferred to the holding  room in stable condition having tolerated the procedure well.   COMPLICATIONS:  None.   FINDINGS:  1. Abdominal aorta:  Diffuse disease without evidence of aneurysm      formation and no stenosis.  2. Renal arteries:  The right appears normal.  The left appears to      have approximately a 50% stenosis.  3. Right leg:  The common iliac, internal iliac, external iliac, and      common femoral arteries have diffuse mild disease without      significant stenosis.  The profunda is widely patent.  The SFA was     occluded from approximately 10 mm distal to its origin to the       adductor canal.  This occlusion was successfully stented, reducing      stenosis from 100% to approximately 30%.  The popliteal is      relatively normal appearing with no significant stenosis.  There is      two vessel runoff  to the foot via the anterior tibial and peroneal.      The posterior tibial occludes proximally but then reconstitutes in      the peroneal just above the ankle.  4. Left leg:  95% stenosis of the common iliac artery extending into      the proximal external iliac.  The internal iliac is patent.  The      stenosis was stented to 10% residual.  The external iliac, common      femoral, and profunda have only minimal disease.  The SFA has      diffuse but very mild disease with a maximal stenosis of 20%.  The      profunda is without significant stenosis.  There is three vessel      runoff to the foot.   IMPRESSION/RECOMMENDATION:  Successful percutaneous revascularization of  both the left common iliac artery and the right superficial femoral  artery.  He will be maintained on aspirin indefinitely and Plavix for 30  days.  Given the substantial recoil of the right superficial femoral  artery, he will need very close surveillance with duplex  ultrasonography.  We need to watch his renal function and blood pressure  carefully given his 50% left renal artery stenosis.  As a risk factor  reduction, the critical points of smoking cessation was again stressed.      Douglas Lyon, MD  Electronically Signed     WED/MEDQ  D:  03/21/2007  T:  03/21/2007  Job:  [id number]   cc:   Biagio Borg, MD

## 2011-04-15 NOTE — Progress Notes (Signed)
[facility]                        PERIPHERAL VASCULAR OFFICE NOTE   NAME:JONESTeryn, Warzecha                          MRN:          [id number]  DATE:03/16/2007                            DOB:          10-Mar-1951    REASON FOR CONSULTATION:  Patient referred by Dr. Jenny Reichmann for consultation  regarding claudication.   HISTORY OF PRESENT ILLNESS:  Mr. Freerksen is a 60 year old gentleman with  atherosclerotic risk factors of ongoing tobacco abuse and hypertension.  For three years, he has had bilateral calf discomfort with walking  approximately 200 yards of level ground.  It is worse with walking up an  incline.  It is always relieved with rest.  If he continues to walk, he  gets numbness in his right calf and the entirety of his left leg.  He  has never had any rest pain or tissue loss.   PAST MEDICAL HISTORY:  Hypertension.   CURRENT MEDICATIONS:  1. Enteric-coated aspirin 325 mg daily.  2. Lotensin/HCTZ 10/12.5 1 daily.  3. Atenolol 50 mg daily.  4. Zocor 80 mg daily.   He has tried cilostazol with no benefit.   ALLERGIES:  NKDA.   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  He is married with two grown children.  He enjoys golf.  He  mows 28 yards.  He denies alcohol and illicit drug use.  He smokes about  a half pack of cigarettes a day, down from two packs a day.   FAMILY HISTORY:  Father is alive at 39 with dementia.  Mother is alive  at 83 with renal disease and coronary disease.  His two siblings are  alive and well.  He has two children.  A daughter had TTP and is  recovering from that.  His son is alive and well.   REVIEW OF SYSTEMS:  Occasional dizziness.  Wears reading glasses.  Occasional chest pain with prior normal evaluation, including cardiac  catheterization.  Review of systems is otherwise negative in detail  except as above.   PHYSICAL EXAMINATION:  GENERAL:  He is generally well-appearing in  no  distress.  VITAL SIGNS:  Heart rate 63, blood pressure 122/72 on the right and  114/70 on the left.  He is 5 feet 5 inches tall and weighs 160 pounds.  SKIN:  Normal.  MUSCULOSKELETAL:  Normal.  HEENT:  Normal.  NECK:  He has no jugular venous distention, thyromegaly, or  lymphadenopathy.  LUNGS:  Clear to auscultation.  Respiratory effort is normal.  CARDIAC:  He has a nondisplaced point of maximal cardiac impulse.  There  is a regular rate and rhythm without murmur, rub or gallop.  ABDOMEN:  Soft, nontender, nondistended.  There is no  hepatosplenomegaly.  Bowel sounds are normal  EXTREMITIES:  Warm without clubbing, cyanosis, edema, or ulceration.  VASCULAR: Carotid pulses are 2+ bilaterally without bruit.  Femoral  pulses are 2+ on the right and trace on the left without bruit.  Popliteal, DP/PT pulses absent bilaterally.   Lower extremity duplex evaluation performed on March 26, 2007.  Demonstrated ABI of 0.67 on the right and 0.83 on the left.  On the  right, he has triphasic flow into the common femoral.  The SFA is  occluded just beyond the ostium with reconstitution in the mid thigh.  On the left, there is broadened, biphasic flow in the common femoral,  suggesting severe proximal stenosis.  No evidence of stenosis below  that.   IMPRESSION/RECOMMENDATIONS:  1. Claudication:  Both legs are roughly equally effected from a      symptomatic viewpoint.  He appears to have a severe iliac stenosis      on the left and a moderate length superficial femoral artery      occlusion on the right.  He has already tried and failed      conservative management, including exercise therapy and cilostazol.      He finds it extremely lifestyle limiting.  We therefore discussed      revascularization options.  Will plan angiography and then probable      attempt at percutaneous revascularization.  Will plan access on the      left.  Will continue his aspirin and initiate Plavix today.   2. Tobacco abuse:  Discussed the critical importance of complete      cessation.  He is willing to try Chantix.  We will prescribe this.      I instructed him in side effects and directions for use.     Ethelle Lyon, MD  Electronically Signed    WED/MedQ  DD: 03/16/2007  DT: 03/17/2007  Job #: [id number]

## 2011-04-15 NOTE — Discharge Summary (Signed)
Douglas Christensen, GILLS NO.:  [id number]   MEDICAL RECORD NO.:  [id number]          PATIENT TYPE:  INP   LOCATION:  6529                         FACILITY:  [facility]   PHYSICIAN:  Ethelle Lyon, MD  DATE OF BIRTH:  10/26/1951   DATE OF ADMISSION:  03/21/2007  DATE OF DISCHARGE:  03/22/2007                               DISCHARGE SUMMARY   PRIMARY CARE PHYSICIAN:  Dr. Jenny Reichmann.   PRIMARY CARDIOLOGIST:  Dr. Sabino Snipes.   PROCEDURES PERFORMED DURING HOSPITALIZATION:  1. Abdominal aortography with bilateral lower extremity run off.      a.     Stenting of the left common iliac artery via ipsilateral       approach.      b.     Stenting of the left superficial artery.      c.     Stenting of the right superficial femoral artery via       contralateral approach.   DISCHARGE DIAGNOSES:  1. Peripheral arterial disease.  2. Hypertension.  3. Ongoing tobacco abuse.  4. Hypercholesterolemia.   HISTORY OF PRESENT ILLNESS:  Douglas Christensen is a 60 year old Caucasian male  with a history of hypertension and bilateral calf discomfort with  walking approximately 200 yards on level ground.  The patient complained  of worsening with walking up an incline, which is always relieved with  rest.  The patient was seen by Dr. Albertine Patricia in his office on March 16, 2007 on consultation at the request of Dr. Jenny Reichmann secondary to complaints  of claudication.  It was found, by Dr. Albertine Patricia, that the patient appeared  to have severe iliac stenosis on the left and moderate superficial  femoral artery occlusion on the right secondary to a lower extremity  duplex evaluation performed on March 26, 2007.  His ABI was demonstrated  to be 0.67 on the right and 0.83 on the left.  The patient was admitted  for above-mentioned procedure per Dr. Albertine Patricia.  The patient tolerated the  procedure well with excellent results.  The patient had several amount  of pain at the groin site after procedure and did require  morphine  secondary to this.  The patient did have a followup Doppler ultrasound  to evaluate the femoral artery site profunda for patency.  Results of  that are pending at this time of dictation.  Otherwise, the patient did  well without any further complaints of bleeding, hematoma or signs of  infection at the insertion site.   LABS ON DISCHARGE:  Hemoglobin 14.2, hematocrit 40.3, white blood cell  is 10.3, platelets 256.  PT 11.7, INR 0.9, PTT 29.1.  Sodium 140,  potassium 3.7, chloride 106, CO2 25, glucose 111, BUN 11, creatinine  0.70.   VITAL SIGNS ON DISCHARGE:  Blood pressure 138/49.  Pulse 61.  Respirations 16.  Temperature 98.4.  O2 saturation 93% on room air.   DISCHARGE MEDICATIONS:  1. Aspirin 325 daily.  2. Lotensin 10 mg daily.  3. HCTZ 12.5 mg daily.  4. Atenolol 50 mg daily.  5. Simvastatin 80 mg  daily.  6. Pletal 100 mg t.i.d.   ALLERGIES:  NO KNOWN DRUG ALLERGIES.   FOLLOWUP PLANS & APPOINTMENTS:  1. The patient is scheduled to have ankle-brachial indices on May 8 at      1 p.m.  2. The patient is to have a follow up with Dr. Albertine Patricia on May 8 at 2:45      p.m. for postprocedure followup.  3. The patient is to follow with his primary care physician, Dr. Jenny Reichmann,      for further medical management.  4. The patient has been given discharge instructions concerning      postprocedure care of the groin site, with particular emphasis      noting bleeding, hematoma, signs of infection or discharge.  5. The patient has been advised to stop smoking during a smoking      cessation evaluation.   Time spent with the patient, to include physician time, 30 minutes.   Please see addendum to this dictation for results of Doppler ultrasound.      Douglas Myron. Purcell Nails, NP      Ethelle Lyon, MD  Electronically Signed    KML/MEDQ  D:  03/22/2007  T:  03/22/2007  Job:  [id number]   cc:   Jenny Reichmann, M.D.

## 2011-04-15 NOTE — Discharge Summary (Signed)
[name], Douglas Christensen NO.:  [id number]   MEDICAL RECORD NO.:  [id number]           PATIENT TYPE:   LOCATION:                                 FACILITY:   PHYSICIAN:  Phill Myron. Purcell Nails, NP     DATE OF BIRTH:   DATE OF ADMISSION:  DATE OF DISCHARGE:                               DISCHARGE SUMMARY   ADDENDUM:  On [name], discharge summary.   The patient did undergo right groin ultrasound for evaluation for  pseudoaneurysm.  This was found to be negative.  The patient was also to  have his Pletal discontinued after phone consultation with Dr. Albertine Patricia  and has been advised not to continue this.  The patient will be placed  on Plavix 75 mg once a day added to medication regimen, as listed on  prior discharge summary.  However, Pletal will not be continued.  The  patient has been found to be stable for discharge home and will follow  up with Dr. Albertine Patricia.      Phill Myron. Purcell Nails, NP     KML/MEDQ  D:  03/22/2007  T:  03/22/2007  Job:  [id number]

## 2011-04-15 NOTE — Discharge Summary (Signed)
[place]  Patient:    Douglas Christensen, Douglas Christensen                          MRN: [id number] Adm. Date:  [id number] Disc. Date: [id number] Attending:  Tera Helper Dictator:   Helayne Seminole, P.A. CC:         [name], M.D. [facility]   Discharge Summary  DISCHARGE DIAGNOSES: 1. Viral gastroenteritis. 2. Atypical chest pain.  HISTORY OF PRESENT ILLNESS:  Douglas Christensen is a 60 year old white male who developed headache, runny nose and diarrhea after eating steak.  He took some Imodium with relief.  Later, while driving to work, he felt momentary pain in his left upper chest.  PAST MEDICAL HISTORY: 1. History of cardiac catheterization in April 1999, with 30% segmental left    main and 70-90% LAD after the diagonal.  RCA was dominant. 2. He had a treadmill in 2001, that was negative for ischemia. 3. Cardiolite in 1999, that was negative. 4. Hypertension. 5. Hyperlipidemia.  HOSPITAL COURSE:  #1 - PROBABLE VIRAL GASTROENTERITIS:  His diarrhea resolved with supportive care.  #2 - CARDIOVASCULAR:  Atypical chest pain with known mild coronary artery disease by catheterization in 1999.  The patient was scheduled for a Cardiolite, however, it could not be done on Apr 04, 2001, and was rescheduled for Apr 05, 2001.  The patients hospitalization was held until his treadmill Cardiolite was completed.  The Cardiolite was negative for ischemia with an EF of 60%.  No further cardiac workup was pursued.  DISCHARGE MEDICATIONS: 1. Atenolol 25 mg q.d. 2. Lipitor 10 mg q.d. 3. Aspirin 325 mg q.d.  FOLLOWUP:  The patient is to follow up with Dr. Alain Marion in two to three weeks.  DD:  04/11/01 TD:  04/11/01 Job: [id number]

## 2011-04-15 NOTE — Assessment & Plan Note (Signed)
[facility]                             PRIMARY CARE OFFICE NOTE   NAME:JONESJakeith, Demont                          MRN:          [id number]  DATE:09/29/2006                            DOB:          [birth year]    Need to address problems with colon polyps, low back pain, vertigo and  elevated cholesterol.   PHYSICAL EXAMINATION:  Blood pressure 116/74, pulse 72, temperature 98.8.  Copy and paste my physical from the previous page.   LABORATORY DATA:  On September 25, 2006 reviewed.   ASSESSMENT/PLAN:  1. Coronary artery disease:  He needs to stop smoking as soon as possible.      Continue current therapy.  Follow up with cardiology.  2. Dyslipidemia:  We will need to optimize his LDL level.  Increase Zocor      to 80 mg daily.  3. 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  Obtain x-ray      of the lumbosacral spine.  Darvocet-N 100 q.i.d. p.r.n., Relafen 500 mg      p.o. b.i.d. with meals.  Exercise range of motion, heating pad,      massage.  Follow up with me in three months.  4. Vertigo, likely benign positional vertigo:  Given __________ exercise      to use when needed.    ______________________________  Evie Lacks Plotnikov, MD    AVP/MedQ  DD: 10/03/2006  DT: 10/03/2006  Job #: [id number]

## 2011-06-02 ENCOUNTER — Telehealth: Payer: Self-pay | Admitting: *Deleted

## 2011-06-02 NOTE — Telephone Encounter (Signed)
Pt c/o low BP and fatigue. BP this am 146/92 before meds. After meds this afternoon BP was 95/51. Scheduled pt for f/u Monday. He is currently taking benaz/hctz 20/25 qAM and atenolol 50 mg qAM. Should he reduce dose while waiting on apt Monday?

## 2011-06-03 NOTE — Telephone Encounter (Signed)
Patient informed, Med list updated.

## 2011-06-03 NOTE — Telephone Encounter (Signed)
Take Benaz/hct in am and Atenolol in pm Thx

## 2011-06-06 ENCOUNTER — Ambulatory Visit (INDEPENDENT_AMBULATORY_CARE_PROVIDER_SITE_OTHER): Payer: 59 | Admitting: Internal Medicine

## 2011-06-06 ENCOUNTER — Other Ambulatory Visit: Payer: Self-pay | Admitting: Internal Medicine

## 2011-06-06 ENCOUNTER — Encounter: Payer: Self-pay | Admitting: Internal Medicine

## 2011-06-06 ENCOUNTER — Telehealth: Payer: Self-pay | Admitting: Internal Medicine

## 2011-06-06 ENCOUNTER — Other Ambulatory Visit (INDEPENDENT_AMBULATORY_CARE_PROVIDER_SITE_OTHER): Payer: 59

## 2011-06-06 DIAGNOSIS — R197 Diarrhea, unspecified: Secondary | ICD-10-CM

## 2011-06-06 DIAGNOSIS — E7800 Pure hypercholesterolemia, unspecified: Secondary | ICD-10-CM

## 2011-06-06 DIAGNOSIS — E559 Vitamin D deficiency, unspecified: Secondary | ICD-10-CM

## 2011-06-06 DIAGNOSIS — I10 Essential (primary) hypertension: Secondary | ICD-10-CM

## 2011-06-06 DIAGNOSIS — I2589 Other forms of chronic ischemic heart disease: Secondary | ICD-10-CM

## 2011-06-06 DIAGNOSIS — E538 Deficiency of other specified B group vitamins: Secondary | ICD-10-CM

## 2011-06-06 DIAGNOSIS — D485 Neoplasm of uncertain behavior of skin: Secondary | ICD-10-CM

## 2011-06-06 LAB — HEPATIC FUNCTION PANEL
ALT: 25 U/L (ref 0–53)
Bilirubin, Direct: 0.1 mg/dL (ref 0.0–0.3)
Total Bilirubin: 0.5 mg/dL (ref 0.3–1.2)

## 2011-06-06 LAB — LDL CHOLESTEROL, DIRECT: Direct LDL: 103.6 mg/dL

## 2011-06-06 LAB — COMPREHENSIVE METABOLIC PANEL
ALT: 25 U/L (ref 0–53)
AST: 19 U/L (ref 0–37)
Calcium: 8.9 mg/dL (ref 8.4–10.5)
Chloride: 105 mEq/L (ref 96–112)
Creatinine, Ser: 2 mg/dL — ABNORMAL HIGH (ref 0.4–1.5)
Sodium: 136 mEq/L (ref 135–145)
Total Bilirubin: 0.5 mg/dL (ref 0.3–1.2)
Total Protein: 7.1 g/dL (ref 6.0–8.3)

## 2011-06-06 LAB — LIPID PANEL
Cholesterol: 168 mg/dL (ref 0–200)
HDL: 32.5 mg/dL — ABNORMAL LOW (ref >39.00)
Total CHOL/HDL Ratio: 5
Triglycerides: 365 mg/dL — ABNORMAL HIGH (ref 0.0–149.0)

## 2011-06-06 MED ORDER — DIPHENOXYLATE-ATROPINE 2.5-0.025 MG PO TABS: 1.0000 | ORAL_TABLET | Freq: Four times a day (QID) | ORAL | Status: AC | PRN

## 2011-06-06 NOTE — Progress Notes (Signed)
  Subjective:    Patient ID: Douglas Christensen, male    DOB: 23-Apr-1951, 59 y.o.   MRN: [id number]  HPI  The patient presents for a follow-up of  chronic hypertension, chronic dyslipidemia, CAD controlled with medicines C/o low BP  episodes several times. F/u LBP. C/o loose watery stools x 1 wk 6-7 a day    Review of Systems  Constitutional: Positive for fatigue. Negative for appetite change and unexpected weight change.  HENT: Negative for nosebleeds, congestion, sore throat, sneezing, trouble swallowing and neck pain.   Eyes: Negative for itching and visual disturbance.  Respiratory: Positive for chest tightness and shortness of breath. Negative for cough.   Cardiovascular: Positive for chest pain. Negative for palpitations and leg swelling.  Gastrointestinal: Negative for nausea, diarrhea, blood in stool and abdominal distention.  Genitourinary: Negative for frequency and hematuria.  Musculoskeletal: Negative for back pain, joint swelling and gait problem.  Skin: Negative for color change and rash.  Neurological: Negative for dizziness, tremors, speech difficulty and weakness.  Psychiatric/Behavioral: Negative for sleep disturbance, dysphoric mood and agitation. The patient is not nervous/anxious.        Objective:   Physical Exam  Constitutional: He is oriented to person, place, and time. He appears well-developed. No distress.  HENT:  Mouth/Throat: Oropharynx is clear and moist.  Eyes: Conjunctivae are normal. Pupils are equal, round, and reactive to light.  Neck: Normal range of motion. No JVD present. No thyromegaly present.  Cardiovascular: Normal rate, regular rhythm, normal heart sounds and intact distal pulses.  Exam reveals no gallop and no friction rub.   No murmur heard. Pulmonary/Chest: Effort normal and breath sounds normal. No respiratory distress. He has no wheezes. He has no rales. He exhibits no tenderness.  Abdominal: Soft. Bowel sounds are normal. He exhibits no  distension and no mass. There is no tenderness. There is no rebound and no guarding.  Musculoskeletal: Normal range of motion. He exhibits no edema and no tenderness.  Lymphadenopathy:    He has no cervical adenopathy.  Neurological: He is alert and oriented to person, place, and time. He has normal reflexes. No cranial nerve deficit. He exhibits normal muscle tone. Coordination normal.  Skin: Skin is warm and dry. No rash noted.       Very tanned  Psychiatric: He has a normal mood and affect. His behavior is normal. Judgment and thought content normal.          Assessment & Plan:

## 2011-06-06 NOTE — Assessment & Plan Note (Signed)
Lotensin HCT in am Atenolol in pm

## 2011-06-06 NOTE — Assessment & Plan Note (Signed)
Take in winter

## 2011-06-06 NOTE — Assessment & Plan Note (Signed)
Will bx

## 2011-06-06 NOTE — Assessment & Plan Note (Signed)
Start Lomotil

## 2011-06-06 NOTE — Telephone Encounter (Signed)
Douglas Christensen , please, inform the patient: labs are OK   Please, keep  next office visit appointment.   Thank you !

## 2011-06-06 NOTE — Assessment & Plan Note (Signed)
On Rx 

## 2011-06-07 NOTE — Telephone Encounter (Signed)
Pt informed

## 2011-06-16 ENCOUNTER — Telehealth: Payer: Self-pay | Admitting: *Deleted

## 2011-06-16 NOTE — Telephone Encounter (Signed)
Pt c/o BP reading 150/83 and pulse range 102-105 Pt is out of town at [employer] on vacation at this time.  LMOM that if any of the following symptoms were being experienced and/or were to develop to seek medical attention for assessment & evaluation at an Urgent Care or ED nearest to his location at this time: headache, dizziness, blurred vision, nausea & vomiting, chest pain, SOB  Will call again tomorrow morning for update.

## 2011-06-17 NOTE — Telephone Encounter (Signed)
Noted Keep ROV Thx 

## 2011-07-18 ENCOUNTER — Ambulatory Visit: Payer: 59 | Admitting: Internal Medicine

## 2011-07-18 DIAGNOSIS — Z0289 Encounter for other administrative examinations: Secondary | ICD-10-CM

## 2011-08-10 ENCOUNTER — Ambulatory Visit (INDEPENDENT_AMBULATORY_CARE_PROVIDER_SITE_OTHER): Payer: 59 | Admitting: Internal Medicine

## 2011-08-10 ENCOUNTER — Encounter: Payer: Self-pay | Admitting: Internal Medicine

## 2011-08-10 DIAGNOSIS — I10 Essential (primary) hypertension: Secondary | ICD-10-CM

## 2011-08-10 DIAGNOSIS — T148XXA Other injury of unspecified body region, initial encounter: Secondary | ICD-10-CM

## 2011-08-10 DIAGNOSIS — E538 Deficiency of other specified B group vitamins: Secondary | ICD-10-CM

## 2011-08-10 DIAGNOSIS — I2510 Atherosclerotic heart disease of native coronary artery without angina pectoris: Secondary | ICD-10-CM

## 2011-08-10 MED ORDER — CYANOCOBALAMIN 1000 MCG/ML IJ SOLN
1000.0000 ug | Freq: Once | INTRAMUSCULAR | Status: AC
  Administered 2011-08-10: 1000 ug via INTRAMUSCULAR

## 2011-08-10 MED ORDER — VITAMIN B-12 1000 MCG SL SUBL: 1.0000 | SUBLINGUAL_TABLET | Freq: Every day | SUBLINGUAL | Status: DC

## 2011-08-10 MED ORDER — ASPIRIN EC 81 MG PO TBEC: 81.0000 mg | DELAYED_RELEASE_TABLET | Freq: Every day | ORAL | Status: DC

## 2011-08-10 NOTE — Assessment & Plan Note (Signed)
Vit C Reduce ASA to 81 mg

## 2011-08-10 NOTE — Assessment & Plan Note (Signed)
Give 1000 mcg IM today Cont Rx

## 2011-08-10 NOTE — Patient Instructions (Addendum)
You can try Vit C 500 mg a day to help bruising Call if problems

## 2011-08-14 NOTE — Assessment & Plan Note (Signed)
Continue with current prescription therapy as reflected on the Med list. BP Readings from Last 3 Encounters:  08/10/11 130/72  06/06/11 90/50  01/27/11 118/62

## 2011-08-14 NOTE — Assessment & Plan Note (Signed)
Chronic No angina

## 2011-08-14 NOTE — Progress Notes (Signed)
  Subjective:    Patient ID: Douglas Christensen, male    DOB: [birth year], 60 y.o.   MRN: [id number]  HPI  C/o bruising on B forearms F/u HTN, CAD  Review of Systems  Constitutional: Negative for appetite change, fatigue and unexpected weight change.  HENT: Negative for nosebleeds, congestion, sore throat, sneezing, trouble swallowing and neck pain.   Eyes: Negative for itching and visual disturbance.  Respiratory: Negative for cough.   Cardiovascular: Negative for chest pain, palpitations and leg swelling.  Gastrointestinal: Negative for nausea, vomiting, diarrhea, blood in stool and abdominal distention.  Genitourinary: Negative for frequency and hematuria.  Musculoskeletal: Negative for back pain, joint swelling and gait problem.  Skin: Negative for rash.  Neurological: Negative for dizziness, tremors, speech difficulty and weakness.  Hematological: Negative for adenopathy. Bruises/bleeds easily.  Psychiatric/Behavioral: Negative for sleep disturbance, dysphoric mood and agitation. The patient is not nervous/anxious.        Objective:   Physical Exam  Constitutional: He is oriented to person, place, and time. He appears well-developed.  HENT:  Mouth/Throat: Oropharynx is clear and moist.  Eyes: Conjunctivae are normal. Pupils are equal, round, and reactive to light.  Neck: Normal range of motion. No JVD present. No thyromegaly present.  Cardiovascular: Normal rate, regular rhythm, normal heart sounds and intact distal pulses.  Exam reveals no gallop and no friction rub.   No murmur heard. Pulmonary/Chest: Effort normal and breath sounds normal. No respiratory distress. He has no wheezes. He has no rales. He exhibits no tenderness.  Abdominal: Soft. Bowel sounds are normal. He exhibits no distension and no mass. There is no tenderness. There is no rebound and no guarding.  Musculoskeletal: Normal range of motion. He exhibits no edema and no tenderness.  Lymphadenopathy:    He has no  cervical adenopathy.  Neurological: He is alert and oriented to person, place, and time. He has normal reflexes. No cranial nerve deficit. He exhibits normal muscle tone. Coordination normal.       Bruised forearms  Skin: Skin is warm and dry. No rash noted.  Psychiatric: He has a normal mood and affect. His behavior is normal. Judgment and thought content normal.     Lab Results  Component Value Date   WBC 9.2 04/04/2011   HGB 16.7 04/04/2011   HCT 45.2 04/04/2011   PLT 192 04/04/2011   CHOL 168 06/06/2011   TRIG 365.0* 06/06/2011   HDL 32.50* 06/06/2011   LDLDIRECT 103.6 06/06/2011   ALT 25 06/06/2011   ALT 25 06/06/2011   AST 19 06/06/2011   AST 19 06/06/2011   NA 136 06/06/2011   K 3.7 06/06/2011   CL 105 06/06/2011   CREATININE 2.0* 06/06/2011   BUN 46* 06/06/2011   CO2 20 06/06/2011   TSH 1.81 12/15/2010   PSA 1.02 12/15/2010   INR 0.9 RATIO 03/16/2007   HGBA1C 6.0 05/01/2008        Assessment & Plan:

## 2011-08-22 LAB — COMPREHENSIVE METABOLIC PANEL
Albumin: 3.8
Alkaline Phosphatase: 63
BUN: 9
Creatinine, Ser: 0.77
Glucose, Bld: 104 — ABNORMAL HIGH
Potassium: 3.8
Total Bilirubin: 0.7
Total Protein: 6.6

## 2011-08-22 LAB — HEPATITIS C ANTIBODY: HCV Ab: NEGATIVE

## 2011-10-05 ENCOUNTER — Other Ambulatory Visit: Payer: Self-pay

## 2011-10-05 ENCOUNTER — Observation Stay (HOSPITAL_COMMUNITY)
Admission: EM | Admit: 2011-10-05 | Discharge: 2011-10-06 | DRG: 918 | Disposition: A | Discharge status: Home / Self Care | Payer: 59 | Attending: Family Medicine | Admitting: Family Medicine

## 2011-10-05 ENCOUNTER — Emergency Department (HOSPITAL_COMMUNITY): Payer: 59

## 2011-10-05 ENCOUNTER — Encounter (HOSPITAL_COMMUNITY): Payer: Self-pay

## 2011-10-05 DIAGNOSIS — I771 Stricture of artery: Secondary | ICD-10-CM

## 2011-10-05 DIAGNOSIS — I739 Peripheral vascular disease, unspecified: Secondary | ICD-10-CM

## 2011-10-05 DIAGNOSIS — M545 Low back pain, unspecified: Secondary | ICD-10-CM

## 2011-10-05 DIAGNOSIS — N529 Male erectile dysfunction, unspecified: Secondary | ICD-10-CM | POA: Diagnosis present

## 2011-10-05 DIAGNOSIS — J449 Chronic obstructive pulmonary disease, unspecified: Secondary | ICD-10-CM | POA: Diagnosis present

## 2011-10-05 DIAGNOSIS — R55 Syncope and collapse: Secondary | ICD-10-CM

## 2011-10-05 DIAGNOSIS — E559 Vitamin D deficiency, unspecified: Secondary | ICD-10-CM

## 2011-10-05 DIAGNOSIS — J4489 Other specified chronic obstructive pulmonary disease: Secondary | ICD-10-CM | POA: Diagnosis present

## 2011-10-05 DIAGNOSIS — E785 Hyperlipidemia, unspecified: Secondary | ICD-10-CM

## 2011-10-05 DIAGNOSIS — I9589 Other hypotension: Secondary | ICD-10-CM | POA: Diagnosis present

## 2011-10-05 DIAGNOSIS — G5710 Meralgia paresthetica, unspecified lower limb: Secondary | ICD-10-CM

## 2011-10-05 DIAGNOSIS — IMO0002 Reserved for concepts with insufficient information to code with codable children: Secondary | ICD-10-CM

## 2011-10-05 DIAGNOSIS — R197 Diarrhea, unspecified: Secondary | ICD-10-CM

## 2011-10-05 DIAGNOSIS — F17200 Nicotine dependence, unspecified, uncomplicated: Secondary | ICD-10-CM | POA: Diagnosis present

## 2011-10-05 DIAGNOSIS — F528 Other sexual dysfunction not due to a substance or known physiological condition: Secondary | ICD-10-CM

## 2011-10-05 DIAGNOSIS — R42 Dizziness and giddiness: Secondary | ICD-10-CM

## 2011-10-05 DIAGNOSIS — Z86010 Personal history of colonic polyps: Secondary | ICD-10-CM

## 2011-10-05 DIAGNOSIS — D485 Neoplasm of uncertain behavior of skin: Secondary | ICD-10-CM

## 2011-10-05 DIAGNOSIS — B351 Tinea unguium: Secondary | ICD-10-CM

## 2011-10-05 DIAGNOSIS — I10 Essential (primary) hypertension: Secondary | ICD-10-CM

## 2011-10-05 DIAGNOSIS — T448X1A Poisoning by centrally-acting and adrenergic-neuron-blocking agents, accidental (unintentional), initial encounter: Principal | ICD-10-CM | POA: Diagnosis present

## 2011-10-05 DIAGNOSIS — R5381 Other malaise: Secondary | ICD-10-CM

## 2011-10-05 DIAGNOSIS — T148XXA Other injury of unspecified body region, initial encounter: Secondary | ICD-10-CM

## 2011-10-05 DIAGNOSIS — K219 Gastro-esophageal reflux disease without esophagitis: Secondary | ICD-10-CM

## 2011-10-05 DIAGNOSIS — E538 Deficiency of other specified B group vitamins: Secondary | ICD-10-CM

## 2011-10-05 DIAGNOSIS — R209 Unspecified disturbances of skin sensation: Secondary | ICD-10-CM

## 2011-10-05 DIAGNOSIS — I2510 Atherosclerotic heart disease of native coronary artery without angina pectoris: Secondary | ICD-10-CM | POA: Diagnosis present

## 2011-10-05 LAB — DIFFERENTIAL
Basophils Relative: 0 % (ref 0–1)
Eosinophils Absolute: 0.1 10*3/uL (ref 0.0–0.7)
Eosinophils Relative: 0 % (ref 0–5)
Lymphs Abs: 2.1 10*3/uL (ref 0.7–4.0)
Monocytes Absolute: 0.9 10*3/uL (ref 0.1–1.0)
Monocytes Relative: 5 % (ref 3–12)
Neutrophils Relative %: 80 % — ABNORMAL HIGH (ref 43–77)

## 2011-10-05 LAB — BASIC METABOLIC PANEL
BUN: 17 mg/dL (ref 6–23)
CO2: 23 mEq/L (ref 19–32)
GFR calc non Af Amer: 64 mL/min — ABNORMAL LOW (ref >90)
Glucose, Bld: 110 mg/dL — ABNORMAL HIGH (ref 70–99)
Potassium: 3.8 mEq/L (ref 3.5–5.1)

## 2011-10-05 LAB — CBC
Hemoglobin: 13.8 g/dL (ref 13.0–17.0)
MCH: 35.8 pg — ABNORMAL HIGH (ref 26.0–34.0)
MCHC: 35.2 g/dL (ref 30.0–36.0)
MCV: 101.6 fL — ABNORMAL HIGH (ref 78.0–100.0)
RBC: 3.86 MIL/uL — ABNORMAL LOW (ref 4.22–5.81)

## 2011-10-05 LAB — POCT I-STAT TROPONIN I: Troponin i, poc: 0.02 ng/mL (ref 0.00–0.08)

## 2011-10-05 MED ORDER — SODIUM CHLORIDE 0.9 % IV BOLUS (SEPSIS)
500.0000 mL | Freq: Once | INTRAVENOUS | Status: AC
  Administered 2011-10-05: 1000 mL via INTRAVENOUS

## 2011-10-05 MED ORDER — SODIUM CHLORIDE 0.9 % IV BOLUS (SEPSIS)
500.0000 mL | Freq: Once | INTRAVENOUS | Status: AC
  Administered 2011-10-05: 500 mL via INTRAVENOUS

## 2011-10-05 NOTE — ED Provider Notes (Signed)
Scribed for Douglas Cable, MD, the patient was seen in room APA02/APA02. This chart was scribed by [employer]. The patient's care started at 20:16  CSN: [id number] Arrival date & time: 10/05/2011  8:15 PM   First MD Initiated Contact with Patient 10/05/11 2016      Chief Complaint  Patient presents with  . Hypotension  . Medication Reaction   Patient is a 60 y.o. male presenting with weakness.  Weakness The primary symptoms include dizziness. The symptoms began 1 to 2 hours ago. The symptoms are unchanged. The neurological symptoms are diffuse.  Dizziness also occurs with weakness.  Additional symptoms include weakness.    Douglas Christensen is a 60 y.o. male who presents to the Emergency Department complaining of Hypotension and Medical Reaction onset this evening. Patient reports that he took Viagra and his other medications as usual this morning, then took one atenolol at 6pm, just before going for dinner at a restaurant. He states that he started feeling queazzy and felt like he was going to pass out. He denies loss of consciousness or trauma during event. Patient denies shortness of breath, vomiting, diarrhea, fever, nausea blood in stool or melena. Patient reports a history of similar reactions to atenolol several years ago. He states that he usually doesn't take atenolol, but did and reacted to it this evening. Patient is currently feeling better and states he feels "a lot better" than he did a couple of hours ago.  Past Medical History  Diagnosis Date  . History of colonic polyps   . COPD (chronic obstructive pulmonary disease)   . CAD (coronary artery disease)   . GERD (gastroesophageal reflux disease)   . Hyperlipidemia   . Hypertension   . Vitamin D deficiency   . Meralgia paresthetica of left side 2011  . LBP (low back pain)   . PVD (peripheral vascular disease)     Past Surgical History  Procedure Date  . Other surgical history     Left superficial artery- Stent    . Femoral artery stent     right superficial    Family History  Problem Relation Age of Onset  . Coronary artery disease Other   . Hypertension Other   . Hypertension Mother   . Cancer Mother     ? colon ca  . Cancer Father     brain tumor  . Hypertension Father   . Mental retardation Father     alzheimer's    History  Substance Use Topics  . Smoking status: Current Everyday Smoker -- 0.5 packs/day  . Smokeless tobacco: Not on file  . Alcohol Use: Yes      Review of Systems  Neurological: Positive for dizziness and weakness.  All other systems reviewed and are negative.    Allergies  Review of patient's allergies indicates no known allergies.  Home Medications   Current Outpatient Rx  Name Route Sig Dispense Refill  . ASPIRIN EC 81 MG PO TBEC Oral Take 1 tablet (81 mg total) by mouth daily. 100 tablet 3  . ATENOLOL 50 MG PO TABS Oral Take 50 mg by mouth at bedtime.      . ATORVASTATIN CALCIUM 10 MG PO TABS Oral Take 10 mg by mouth daily.      Marland Kitchen BENAZEPRIL-HYDROCHLOROTHIAZIDE 20-25 MG PO TABS Oral Take 1 tablet by mouth every morning.      Marland Kitchen VITAMIN D 1000 UNITS PO TABS Oral Take 1,000 Units by mouth daily.      Marland Kitchen  VITAMIN B-12 1000 MCG SL SUBL Sublingual Place 1 tablet (1,000 mcg total) under the tongue daily. 100 tablet 3  . TRAMADOL HCL 100 MG PO TB24 Oral Take 100 mg by mouth daily.      Marland Kitchen VIAGRA 100 MG PO TABS  TAKE 1 TABLET BY MOUTH DAILY AS NEEDED 12 tablet 0    BP 102/55  Pulse 77  Temp(Src) 97.5 F (36.4 C) (Oral)  Resp 21  SpO2 97%  Physical Exam CONSTITUTIONAL: Well developed/well nourished HEAD AND FACE: Normocephalic/atraumatic EYES: EOMI/PERRL ENMT: Mucous membranes moist NECK: supple no meningeal signs CV: S1/S2 noted, no murmurs/rubs/gallops noted LUNGS: Lungs are clear to auscultation bilaterally, no apparent distress ABDOMEN: soft, nontender, no rebound or guarding GU:no cva tenderness NEURO: Pt is awake/alert, moves all  extremitiesx4 EXTREMITIES: pulses normal, full ROM SKIN: warm, color normal PSYCH: no abnormalities of mood noted   ED Course  Procedures   CRITICAL CARE Performed by: Douglas Christensen   Total critical care time: 40  Critical care time was exclusive of separately billable procedures and treating other patients.  Critical care was necessary to treat or prevent imminent or life-threatening deterioration.  Critical care was time spent personally by me on the following activities: development of treatment plan with patient and/or surrogate as well as nursing, discussions with consultants, evaluation of patient's response to treatment, examination of patient, obtaining history from patient or surrogate, ordering and performing treatments and interventions, ordering and review of laboratory studies, ordering and review of radiographic studies, pulse oximetry and re-evaluation of patient's condition.  DIAGNOSTIC STUDIES: Oxygen Saturation is 97% on room air, normal by my interpretation.    COORDINATION OF CARE: 20:25 - EDP examined patient at bedside and ordered the following Orders Placed This Encounter  Procedures  . ED EKG      10:36 PM Pt had felt well on arrival, and still feels well though BP has not normalized, will continue fluids and check labs  12:33 AM Pt denies any new complaints, denies cp/sob/abd pain/back pain/fever/HA No recent vomiting/diarrhea He insists this from one dose of atenolol and took all of his other meds as prescribed Continue IV fluids Repeat EKG unchanged  1:43 AM BP still in 90s Pt feels well, but still unclear cause of hypotension after 3LNS He does not think he took any other meds Will admit for obs D/w dr Shanon Brow  MDM: Nursing notes reviewed and considered in documentation All labs/vitals reviewed and considered xrays reviewed and considered     Date: 10/05/2011  Rate: 72  Rhythm: normal sinus rhythm  QRS Axis: normal  Intervals: PR  prolonged  ST/T Wave abnormalities: nonspecific ST changes  Conduction Disutrbances:first-degree A-V block   Narrative Interpretation:   Old EKG Reviewed: unchanged    Scribe Attestation I personally performed the services described in this documentation, which was scribed in my presence. The recorded information has been reviewed and considered.        Douglas Cable, MD 10/06/11 [phone]

## 2011-10-05 NOTE — ED Notes (Signed)
Just prior to going out to eat tonight, pt states he took his atenolol and also viagra, now with hypotension, weakness

## 2011-10-05 NOTE — ED Notes (Signed)
Pt's bp remains low after fluid bolus, heart dropped into 40's for approx 30 seconds, pt alert, stated he is feeling much better.  md aware, additional labs ordered.

## 2011-10-05 NOTE — ED Notes (Signed)
Woke from sleep, resp even ,stated feeling much better.

## 2011-10-05 NOTE — ED Notes (Signed)
Pt stated he was feeling much better, wife at bedside.  resp even

## 2011-10-06 ENCOUNTER — Encounter (HOSPITAL_COMMUNITY): Payer: Self-pay | Admitting: *Deleted

## 2011-10-06 LAB — BASIC METABOLIC PANEL
BUN: 15 mg/dL (ref 6–23)
Chloride: 105 mEq/L (ref 96–112)
Creatinine, Ser: 1.03 mg/dL (ref 0.50–1.35)
GFR calc Af Amer: 89 mL/min — ABNORMAL LOW (ref >90)
Glucose, Bld: 89 mg/dL (ref 70–99)
Potassium: 3.7 mEq/L (ref 3.5–5.1)

## 2011-10-06 LAB — CBC
HCT: 37.3 % — ABNORMAL LOW (ref 39.0–52.0)
Hemoglobin: 13.1 g/dL (ref 13.0–17.0)
MCH: 35.9 pg — ABNORMAL HIGH (ref 26.0–34.0)
MCHC: 35.1 g/dL (ref 30.0–36.0)
RDW: 14 % (ref 11.5–15.5)

## 2011-10-06 LAB — URINALYSIS, ROUTINE W REFLEX MICROSCOPIC
Glucose, UA: NEGATIVE mg/dL
Hgb urine dipstick: NEGATIVE
Specific Gravity, Urine: 1.025 (ref 1.005–1.030)
Urobilinogen, UA: 1 mg/dL (ref 0.0–1.0)
pH: 5.5 (ref 5.0–8.0)

## 2011-10-06 LAB — CARDIAC PANEL(CRET KIN+CKTOT+MB+TROPI)
CK, MB: 2.6 ng/mL (ref 0.3–4.0)
Troponin I: <0.3 ng/mL (ref <0.30)

## 2011-10-06 MED ORDER — SODIUM CHLORIDE 0.9 % IV BOLUS (SEPSIS)
500.0000 mL | Freq: Once | INTRAVENOUS | Status: AC
  Administered 2011-10-06: 500 mL via INTRAVENOUS

## 2011-10-06 MED ORDER — SODIUM CHLORIDE 0.9 % IV SOLN
INTRAVENOUS | Status: DC
  Administered 2011-10-06: 04:00:00 via INTRAVENOUS

## 2011-10-06 MED ORDER — SIMVASTATIN 20 MG PO TABS
20.0000 mg | ORAL_TABLET | Freq: Every day | ORAL | Status: DC
  Administered 2011-10-06: 20 mg via ORAL
  Filled 2011-10-06: qty 1

## 2011-10-06 MED ORDER — ASPIRIN EC 81 MG PO TBEC
81.0000 mg | DELAYED_RELEASE_TABLET | Freq: Every day | ORAL | Status: DC
  Administered 2011-10-06: 81 mg via ORAL
  Filled 2011-10-06: qty 1

## 2011-10-06 NOTE — Discharge Summary (Signed)
  Physician Discharge Summary  Patient ID: Douglas Christensen MRN: [id number] DOB/AGE: May 24, 1951 60 y.o.  Admit date: 10/05/2011 Discharge date: 10/06/2011  Primary Care Physician:  [name], MD, MD   Discharge Diagnoses:   1.beta blocker-induced hypotension, resolved  2.tobacco abuse 3.hypertension 4.coronary artery disease 5.COPD 6.erectile dysfunction   Current Discharge Medication List    CONTINUE these medications which have NOT CHANGED   Details  aspirin EC 81 MG tablet Take 1 tablet (81 mg total) by mouth daily. Qty: 100 tablet, Refills: 3    atorvastatin (LIPITOR) 10 MG tablet Take 10 mg by mouth every morning.     benazepril-hydrochlorthiazide (LOTENSIN HCT) 20-25 MG per tablet Take 1 tablet by mouth every morning.      cholecalciferol (VITAMIN D) 1000 UNITS tablet Take 1,000 Units by mouth daily.      Cyanocobalamin (VITAMIN B-12) 1000 MCG SUBL Place 1 tablet (1,000 mcg total) under the tongue daily. Qty: 100 tablet, Refills: 3    ranitidine (ZANTAC) 150 MG tablet Take 150 mg by mouth daily as needed. For heartburn       STOP taking these medications     atenolol (TENORMIN) 50 MG tablet      sildenafil (VIAGRA) 100 MG tablet          Disposition and Follow-up: Patient will be discharged home today in stable and improved condition. He has been instructed to secure hospital followup appointment with his primary care physician Dr. Alain Marion  in  4 weeks.  Consults:  none    Significant Diagnostic Studies:  Dg Chest Portable 1 View  10/05/2011  *RADIOLOGY REPORT*  Clinical Data: Weakness  PORTABLE CHEST - 1 VIEW  Comparison: 04/04/2011  Findings: Low lung volumes.  Bibasilar atelectasis.  Normal heart size.  No pneumothorax.  IMPRESSION: Bibasilar atelectasis.  Original Report Authenticated By: Jamas Lav, M.D.    Brief H and P: For complete details please refer to admission H and P, but in brief patient is a 60 year old man with a history of  hypertension who apparently had recently been taken off his atenolol by his primary care physician do 2 episodes of low blood pressure. He presented to the hospital after taking a dose of atenolol after he "felt like his blood pressure was high". He subsequently became very dizzy and had a presyncopal event, upon EMS arrival he was found to be very hypotensive which quickly resolved with fluids and he was brought to the hospital for further evaluation. We are asked to admit him for observation for further evaluation and management.    Hospital Course:  #1 beta blocker-induced hypotension: Patient had a presyncopal event associated with low blood pressures related to taking extra doses of beta blocker. He promises he will not do this again. I have asked him to discard his bottle of atenolol especially given the fact that his PCP had discontinued it previously. His blood pressures over the past 12 hours have been over [id number] systolic and he is not bradycardic, because of this I believe he is stable for discharge home today.  All rest of chronic conditions have been stable this hospitalization. None of his home medications haven't been altered.   Time spent on Discharge: Greater than 30 minutes.  SignedLelon Frohlich 10/06/2011, 1:59 PM

## 2011-10-06 NOTE — ED Notes (Signed)
Pt sitting up on side of bed, denies any complaints, bp remains low.  md notified.

## 2011-10-06 NOTE — Progress Notes (Signed)
UR Chart Review Completed  

## 2011-10-06 NOTE — ED Notes (Signed)
Pt repos for comfort warm blanket given

## 2011-10-06 NOTE — ED Notes (Signed)
md notified of current bp, will hang another liter of fluid.

## 2011-10-06 NOTE — H&P (Signed)
PCP:   [name], MD, MD   Chief Complaint:  dizzy  HPI: 60 yo male h/o htn who thought his bp was elevated today so he took an atenolol 50mg  pill once before going out to eat.  Went out to eat and got very dizzy and almost passed out.  Hypotensive with ems and arrival to ED.  He was previously on atenolol but his pcp recently stopped it b/c his bp was low.  He has been taking his bp at home and over the last week his sbp runs 120 range.  For some reason he "felt" his bp was high but did not check it and took the atenolol pill.  His bp has responded to ivf in ED.  O/w normal state of health before this.  No cp, or sob.  No seizure activity.  He has gotten up and moved around and does not get dizzy anymore.  Review of Systems:  O/w neg  Past Medical History: Past Medical History  Diagnosis Date  . History of colonic polyps   . COPD (chronic obstructive pulmonary disease)   . CAD (coronary artery disease)   . GERD (gastroesophageal reflux disease)   . Hyperlipidemia   . Hypertension   . Vitamin D deficiency   . Meralgia paresthetica of left side 2011  . LBP (low back pain)   . PVD (peripheral vascular disease)    Past Surgical History  Procedure Date  . Other surgical history     Left superficial artery- Stent  . Femoral artery stent     right superficial    Medications: Prior to Admission medications   Medication Sig Start Date End Date Taking? Authorizing Provider  aspirin EC 81 MG tablet Take 1 tablet (81 mg total) by mouth daily. 08/10/11 08/09/12 Yes Alex Plotnikov, MD  atenolol (TENORMIN) 50 MG tablet Take 50 mg by mouth every evening.    Yes Historical Provider, MD  atorvastatin (LIPITOR) 10 MG tablet Take 10 mg by mouth every morning.    Yes Historical Provider, MD  benazepril-hydrochlorthiazide (LOTENSIN HCT) 20-25 MG per tablet Take 1 tablet by mouth every morning.     Yes Historical Provider, MD  cholecalciferol (VITAMIN D) 1000 UNITS tablet Take 1,000 Units by  mouth daily.     Yes Historical Provider, MD  Cyanocobalamin (VITAMIN B-12) 1000 MCG SUBL Place 1 tablet (1,000 mcg total) under the tongue daily. 08/10/11  Yes Alex Plotnikov, MD  ranitidine (ZANTAC) 150 MG tablet Take 150 mg by mouth daily as needed. For heartburn    Yes Historical Provider, MD  sildenafil (VIAGRA) 100 MG tablet   02/18/11  Yes [name], MD    Allergies:  No Known Allergies  Social History:  reports that he has been smoking Cigarettes.  He has been smoking about 2 packs per day. He does not have any smokeless tobacco history on file. He reports that he drinks about 1.2 ounces of alcohol per week. He reports that he does not use illicit drugs.  Family History: Family History  Problem Relation Age of Onset  . Coronary artery disease Other   . Hypertension Other   . Hypertension Mother   . Cancer Mother     ? colon ca  . Cancer Father     brain tumor  . Hypertension Father   . Mental retardation Father     alzheimer's    Physical Exam: Filed Vitals:   10/06/11 0037 10/06/11 0109 10/06/11 0129 10/06/11 0253  BP: 83/44  104/86 90/52 75/38   Pulse: 72 75 67 63  Temp:    98 F (36.7 C)  TempSrc:    Oral  Resp:  20  18  Height:    5\' 5"  (1.651 m)  Weight:    75.8 kg (167 lb 1.7 oz)  SpO2: 100% 96%  97%   General appearance: alert and cooperative Neck: no adenopathy, no carotid bruit, no JVD, supple, symmetrical, trachea midline and thyroid not enlarged, symmetric, no tenderness/mass/nodules Resp: clear to auscultation bilaterally Cardio: regular rate and rhythm, S1, S2 normal, no murmur, click, rub or gallop GI: soft, non-tender; bowel sounds normal; no masses,  no organomegaly Extremities: extremities normal, atraumatic, no cyanosis or edema Pulses: 2+ and symmetric Skin: Skin color, texture, turgor normal. No rashes or lesions Neurologic: Grossly normal   Labs on Admission:   [facility] 10/05/11 2251  NA 135  K 3.8  CL 103  CO2 23  GLUCOSE 110*    BUN 17  CREATININE 1.20  CALCIUM 8.5  MG --  PHOS --   No results found for this basename: AST:2,ALT:2,ALKPHOS:2,BILITOT:2,PROT:2,ALBUMIN:2 in the last 72 hours No results found for this basename: LIPASE:2,AMYLASE:2 in the last 72 hours  Basename 10/05/11 2251  WBC 15.6*  NEUTROABS 12.5*  HGB 13.8  HCT 39.2  MCV 101.6*  PLT 221   No results found for this basename: CKTOTAL:3,CKMB:3,CKMBINDEX:3,TROPONINI:3 in the last 72 hours No results found for this basename: TSH,T4TOTAL,FREET3,T3FREE,THYROIDAB in the last 72 hours No results found for this basename: VITAMINB12:2,FOLATE:2,FERRITIN:2,TIBC:2,IRON:2,RETICCTPCT:2 in the last 72 hours  Radiological Exams on Admission: Dg Chest Portable 1 View  10/05/2011  *RADIOLOGY REPORT*  Clinical Data: Weakness  PORTABLE CHEST - 1 VIEW  Comparison: 04/04/2011  Findings: Low lung volumes.  Bibasilar atelectasis.  Normal heart size.  No pneumothorax.  IMPRESSION: Bibasilar atelectasis.  Original Report Authenticated By: Jamas Lav, M.D.    Assessment/Plan Present on Admission: 60 yo male hypotensive and dizzy secondary to mild bblocker overdose. 1.  bblocker overdose accidental with hypotension resolving.  Obviously hold bblocker and other bp meds.  Cont ivf.  obs overnight.  Will serial cardiac enzymes.  Ck orthostatics in the am.  Possible d/c late in the day later on. 2.  htn   Hold all meds. 3.  Presyncope due to the above.  No further w/u.    Karlina Suares A 10/06/2011, 4:05 AM

## 2011-10-06 NOTE — ED Notes (Signed)
Asleep in bed, resp even , wife at bedside.

## 2011-10-06 NOTE — ED Notes (Signed)
Pt ambulated around nursing unit, denies any complaints.

## 2011-10-10 ENCOUNTER — Ambulatory Visit (INDEPENDENT_AMBULATORY_CARE_PROVIDER_SITE_OTHER): Payer: 59 | Admitting: Cardiology

## 2011-10-10 ENCOUNTER — Encounter: Payer: Self-pay | Admitting: Cardiology

## 2011-10-10 DIAGNOSIS — M79609 Pain in unspecified limb: Secondary | ICD-10-CM

## 2011-10-10 DIAGNOSIS — I10 Essential (primary) hypertension: Secondary | ICD-10-CM

## 2011-10-10 DIAGNOSIS — E785 Hyperlipidemia, unspecified: Secondary | ICD-10-CM

## 2011-10-10 DIAGNOSIS — M79606 Pain in leg, unspecified: Secondary | ICD-10-CM

## 2011-10-10 DIAGNOSIS — F17200 Nicotine dependence, unspecified, uncomplicated: Secondary | ICD-10-CM

## 2011-10-10 DIAGNOSIS — I739 Peripheral vascular disease, unspecified: Secondary | ICD-10-CM

## 2011-10-10 MED ORDER — VARENICLINE TARTRATE 1 MG PO TABS: 1.0000 mg | ORAL_TABLET | Freq: Two times a day (BID) | ORAL | Status: DC

## 2011-10-10 NOTE — Assessment & Plan Note (Signed)
I will start with ABIs. We also discussed at length risk reduction. He will need PV referral and likely angiography.

## 2011-10-10 NOTE — Assessment & Plan Note (Signed)
His lipids are not perfect but much better than previous.  He will continue with meds as listed with particular attention to diet.

## 2011-10-10 NOTE — Patient Instructions (Signed)
Please start Chantix as directed Continue all other medications as listed  Your physician has requested that you have a lower extremity arterial duplex. This test is an ultrasound of the arteries in the legs. It looks at arterial blood flow in the legs. Allow one hour for Lower and Upper Arterial scans. There are no restrictions or special instructions  You have been referred to a PV specialists to follow up after testing.

## 2011-10-10 NOTE — Assessment & Plan Note (Signed)
(  Greater than 40 minutes reviewing all data with greater than 50% face to face with the patient).  He does want to try Chantix.  We discussed all of the potential side effects and in particular I reviewed the [employer].  He has no depression.  He understands and wishes to try this medication.

## 2011-10-10 NOTE — Assessment & Plan Note (Signed)
His blood pressure is controlled.  He will continue with meds as listed.

## 2011-10-10 NOTE — Progress Notes (Signed)
HPI The patient presents for evaluation of lower extremity pain. He has a history of nonobstructive coronary disease with catheterization years ago. He has a history of peripheral vascular disease as described below. He had stenting of both lower extremities by Dr. Albertine Patricia.  He now presents with recurrent leg pain. This has been progressive over many months. He has leg pain walking greater than 25 yards on level ground. It is a pain from his hip down to his foot. It is similar to previous claudication. He may also have this with standing. He has seen a neurosurgeon and was told this is not related to a back or spinal problem. He's not describing any discomfort when he is at rest and off his feet. He does still work as a Curator. He denies any chest pressure, neck or arm discomfort. He has no shortness of breath, PND or orthopnea.  He has no weight gain or edema.  No Known Allergies  Current Outpatient Prescriptions  Medication Sig Dispense Refill  . aspirin EC 81 MG tablet Take 1 tablet (81 mg total) by mouth daily.  100 tablet  3  . atorvastatin (LIPITOR) 10 MG tablet Take 10 mg by mouth every morning.       . benazepril-hydrochlorthiazide (LOTENSIN HCT) 20-25 MG per tablet Take 1 tablet by mouth every morning.        . cholecalciferol (VITAMIN D) 1000 UNITS tablet Take 1,000 Units by mouth daily.        . Cyanocobalamin (VITAMIN B-12) 1000 MCG SUBL Place 1 tablet (1,000 mcg total) under the tongue daily.  100 tablet  3  . ranitidine (ZANTAC) 150 MG tablet Take 150 mg by mouth daily as needed. For heartburn         Past Medical History  Diagnosis Date  . History of colonic polyps   . COPD (chronic obstructive pulmonary disease)   . CAD (coronary artery disease)     Mild plaque (cath "years ago")  . GERD (gastroesophageal reflux disease)   . Hyperlipidemia   . Hypertension   . Vitamin D deficiency   . Meralgia paresthetica of left side 2011  . LBP (low back pain)   . PVD  (peripheral vascular disease)     Stent to left common femoral and right superficial femoral.  2008.  50%  left renal     Past Surgical History  Procedure Date  . None     Family History  Problem Relation Age of Onset  . Heart disease Mother     No clear CAD  . Hypertension Mother   . Cancer Mother     ? colon ca  . Cancer Father     brain tumor  . Alzheimer's disease Father     History   Social History  . Marital Status: Married    Spouse Name: N/A    Number of Children: N/A  . Years of Education: N/A   Occupational History  . Not on file.   Social History Main Topics  . Smoking status: Current Everyday Smoker -- 2.0 packs/day for 45 years    Types: Cigarettes  . Smokeless tobacco: Not on file  . Alcohol Use: 1.2 oz/week    2 Cans of beer per week  . Drug Use: No  . Sexually Active: Yes   Other Topics Concern  . Not on file   Social History Narrative   Widower    ROS:  Positive for occasional dizziness (recent low BP and ER  visit related to beta blocker).  Otherwise as stated in the HPI and negative for all other systems.  PHYSICAL EXAM BP 142/82  Pulse 96  Ht 5\' 4"  (1.626 m)  Wt 166 lb (75.297 kg)  BMI 28.49 kg/m2 GENERAL:  Well appearing HEENT:  Pupils equal round and reactive, fundi not visualized, oral mucosa unremarkable NECK:  No jugular venous distention, waveform within normal limits, carotid upstroke brisk and symmetric, no bruits, no thyromegaly LYMPHATICS:  No cervical, inguinal adenopathy LUNGS:  Clear to auscultation bilaterally BACK:  No CVA tenderness CHEST:  Unremarkable HEART:  PMI not displaced or sustained,S1 and S2 within normal limits, no S3, no S4, no clicks, no rubs, no murmurs ABD:  Flat, positive bowel sounds normal in frequency in pitch, no bruits, no rebound, no guarding, no midline pulsatile mass, no hepatomegaly, no splenomegaly EXT:  2 plus pulses upper, absent DP/PT, no edema, no cyanosis no clubbing SKIN:  No rashes no  nodules NEURO:  Cranial nerves II through XII grossly intact, motor grossly intact throughout PSYCH:  Cognitively intact, oriented to person place and time  EKG sinus rhythm, rate 96, rightward axis, baseline artifact, no acute ST-T wave changes. 10/10/2011     ASSESSMENT AND PLAN

## 2011-11-09 ENCOUNTER — Other Ambulatory Visit: Payer: Self-pay | Admitting: Cardiology

## 2011-11-09 DIAGNOSIS — I739 Peripheral vascular disease, unspecified: Secondary | ICD-10-CM

## 2011-11-10 ENCOUNTER — Encounter: Payer: Self-pay | Admitting: Cardiovascular Disease

## 2011-11-10 ENCOUNTER — Encounter: Payer: Self-pay | Admitting: *Deleted

## 2011-11-10 ENCOUNTER — Ambulatory Visit (INDEPENDENT_AMBULATORY_CARE_PROVIDER_SITE_OTHER): Payer: 59 | Admitting: Cardiovascular Disease

## 2011-11-10 ENCOUNTER — Encounter (INDEPENDENT_AMBULATORY_CARE_PROVIDER_SITE_OTHER): Payer: 59 | Admitting: Cardiology

## 2011-11-10 VITALS — BP 120/76 | HR 96 | Resp 18 | Ht 65.0 in | Wt 167.1 lb

## 2011-11-10 DIAGNOSIS — M79606 Pain in leg, unspecified: Secondary | ICD-10-CM

## 2011-11-10 DIAGNOSIS — I70219 Atherosclerosis of native arteries of extremities with intermittent claudication, unspecified extremity: Secondary | ICD-10-CM

## 2011-11-10 DIAGNOSIS — I739 Peripheral vascular disease, unspecified: Secondary | ICD-10-CM

## 2011-11-10 MED ORDER — CLOPIDOGREL BISULFATE 75 MG PO TABS: 75.0000 mg | ORAL_TABLET | Freq: Every day | ORAL | Status: DC

## 2011-11-10 NOTE — Patient Instructions (Signed)
Your physician has requested that you have a peripheral vascular angiogram. This exam is performed at the hospital. During this exam IV contrast is used to look at arterial blood flow. Please review the information sheet given for details.

## 2011-11-12 NOTE — Assessment & Plan Note (Signed)
The patient has developed severe lifestyle limiting claudication of the left leg. He has known peripheral arterial disease and is noninvasive study is suggestive of significant inflow disease involving the left leg. Will plan on a lower extremity angiogram with possible PTA and stenting. Risks were reviewed, including but not limited to bleeding, infection, arterial injury, emergency surgery, and death. Major risks were estimated at 1% or less. The patient understands and agrees to proceed. He will be started on Plavix 75 mg daily in anticipation of intervention. He otherwise will continue his current medical program.

## 2011-11-12 NOTE — Progress Notes (Signed)
Patient ID: Douglas Christensen, male   DOB: 1951-09-13, 60 y.o.   MRN: [id number] HPI:  This is a 60 year old gentleman presenting for initial evaluation of peripheral arterial disease.  The patient has developed severe and progressive left leg pain over the past year. He describes numbness with standing and walking. He also has calf tightness and pain. He can walk less than one block without stopping. The patient also describes some dyspnea with exertion. He denies chest pain or pressure. He has no rest pain in his legs. He denies ulceration or nonhealing wounds. He does not have pain in the right leg.  He has known peripheral arterial disease. In 2008 he underwent stenting of the left common iliac artery and the right superficial femoral artery. His left common iliac was treated with an 8 x 24 mm balloon expandable stent. His right superficial femoral artery was treated with multiple long 8 mm self-expanding stents. There was significant recoil noted thereafter right SFA balloon post dilatation. He has clinically done well since his percutaneous interventions until this past year. The patient is a long-standing smoker. He is determined to quit.  Noninvasive studies were done and they show ABIs of 60% on the right at 60% on the left. There is suggestion of severe common femoral artery stenosis on the right and severe inflow disease on the left.  Outpatient Encounter Prescriptions as of 11/10/2011  Medication Sig Dispense Refill  . aspirin EC 81 MG tablet Take 1 tablet (81 mg total) by mouth daily.  100 tablet  3  . atorvastatin (LIPITOR) 10 MG tablet Take 10 mg by mouth every morning.       . benazepril-hydrochlorthiazide (LOTENSIN HCT) 20-25 MG per tablet Take 1 tablet by mouth every morning.        . cholecalciferol (VITAMIN D) 1000 UNITS tablet Take 1,000 Units by mouth daily.        . Cyanocobalamin (VITAMIN B-12) 1000 MCG SUBL Place 1 tablet (1,000 mcg total) under the tongue daily.  100 tablet  3  .  ranitidine (ZANTAC) 150 MG tablet Take 150 mg by mouth daily as needed. For heartburn       . clopidogrel (PLAVIX) 75 MG tablet Take 1 tablet (75 mg total) by mouth daily.  30 tablet  11  . varenicline (CHANTIX CONTINUING MONTH PAK) 1 MG tablet Take 1 tablet (1 mg total) by mouth 2 (two) times daily.  64 tablet  6    Review of patient's allergies indicates no known allergies.  Past Medical History  Diagnosis Date  . History of colonic polyps   . COPD (chronic obstructive pulmonary disease)   . CAD (coronary artery disease)     Mild plaque (cath "years ago")  . GERD (gastroesophageal reflux disease)   . Hyperlipidemia   . Hypertension   . Vitamin D deficiency   . Meralgia paresthetica of left side 2011  . LBP (low back pain)   . PVD (peripheral vascular disease)     Stent to left common femoral and right superficial femoral.  2008.  50%  left renal     Past Surgical History  Procedure Date  . None     History   Social History  . Marital Status: Married    Spouse Name: N/A    Number of Children: N/A  . Years of Education: N/A   Occupational History  . Not on file.   Social History Main Topics  . Smoking status: Current Everyday Smoker -- 2.0  packs/day for 45 years    Types: Cigarettes  . Smokeless tobacco: Not on file  . Alcohol Use: 1.2 oz/week    2 Cans of beer per week  . Drug Use: No  . Sexually Active: Yes   Other Topics Concern  . Not on file   Social History Narrative   Widower    Family History  Problem Relation Age of Onset  . Heart disease Mother     No clear CAD  . Hypertension Mother   . Cancer Mother     ? colon ca  . Cancer Father     brain tumor  . Alzheimer's disease Father     ROS: General: no fevers/chills/night sweats Eyes: no blurry vision, diplopia, or amaurosis ENT: no sore throat or hearing loss Resp: occasional cough, no wheezing or hemoptysis CV: no edema or palpitations GI: no abdominal pain, nausea, vomiting, diarrhea,  or constipation GU: no dysuria, frequency, or hematuria Skin: no rash Neuro: no headache, numbness, tingling, or weakness of extremities Musculoskeletal: no joint pain or swelling Heme: no bleeding, DVT, or easy bruising Endo: no polydipsia or polyuria  BP 120/76  Pulse 96  Resp 18  Ht 5\' 5"  (1.651 m)  Wt 75.805 kg (167 lb 1.9 oz)  BMI 27.81 kg/m2  PHYSICAL EXAM: Pt is alert and oriented, WD, WN, in no distress. HEENT: normal Neck: JVP normal. Carotid upstrokes normal without bruits. No thyromegaly. Lungs: equal expansion, clear bilaterally CV: Apex is discrete and nondisplaced, RRR without murmur or gallop Abd: soft, NT, +BS, no bruit, no hepatosplenomegaly Back: no CVA tenderness Ext: no C/C/E        Femoral pulses 2+ on the right and trace on the left        DP/PT pulses diminished bilaterally Skin: warm and dry without rash Neuro: CNII-XII intact             Strength intact = bilaterally  EKG:  Normal sinus rhythm 96 beats per minute, rightward axis, age indeterminate inferior infarct, age indeterminate anterior infarct.  ASSESSMENT AND PLAN:

## 2011-11-16 ENCOUNTER — Encounter (HOSPITAL_COMMUNITY)
Admission: RE | Disposition: A | Discharge status: Home / Self Care | Payer: Self-pay | Source: Ambulatory Visit | Attending: Cardiovascular Disease

## 2011-11-16 ENCOUNTER — Ambulatory Visit (HOSPITAL_COMMUNITY)
Admission: RE | Admit: 2011-11-16 | Discharge: 2011-11-16 | Disposition: A | Discharge status: Home / Self Care | Payer: 59 | Source: Ambulatory Visit | Attending: Cardiovascular Disease | Admitting: Cardiovascular Disease

## 2011-11-16 ENCOUNTER — Ambulatory Visit (HOSPITAL_COMMUNITY): Admit: 2011-11-16 | Payer: Self-pay | Admitting: Cardiovascular Disease

## 2011-11-16 ENCOUNTER — Other Ambulatory Visit: Payer: Self-pay

## 2011-11-16 DIAGNOSIS — J4489 Other specified chronic obstructive pulmonary disease: Secondary | ICD-10-CM | POA: Insufficient documentation

## 2011-11-16 DIAGNOSIS — K219 Gastro-esophageal reflux disease without esophagitis: Secondary | ICD-10-CM | POA: Insufficient documentation

## 2011-11-16 DIAGNOSIS — I2510 Atherosclerotic heart disease of native coronary artery without angina pectoris: Secondary | ICD-10-CM | POA: Insufficient documentation

## 2011-11-16 DIAGNOSIS — J449 Chronic obstructive pulmonary disease, unspecified: Secondary | ICD-10-CM | POA: Insufficient documentation

## 2011-11-16 DIAGNOSIS — I70219 Atherosclerosis of native arteries of extremities with intermittent claudication, unspecified extremity: Secondary | ICD-10-CM | POA: Insufficient documentation

## 2011-11-16 DIAGNOSIS — I10 Essential (primary) hypertension: Secondary | ICD-10-CM | POA: Insufficient documentation

## 2011-11-16 HISTORY — PX: PERCUTANEOUS STENT INTERVENTION: SHX5500

## 2011-11-16 HISTORY — PX: ABDOMINAL AORTAGRAM: SHX5454

## 2011-11-16 HISTORY — PX: LOWER EXTREMITY ANGIOGRAM: SHX5508

## 2011-11-16 LAB — PROTIME-INR
INR: 0.93 (ref 0.00–1.49)
Prothrombin Time: 12.7 seconds (ref 11.6–15.2)

## 2011-11-16 LAB — CBC
MCH: 36 pg — ABNORMAL HIGH (ref 26.0–34.0)
MCHC: 36.6 g/dL — ABNORMAL HIGH (ref 30.0–36.0)
MCV: 98 fL (ref 78.0–100.0)
Platelets: 209 10*3/uL (ref 150–400)
Platelets: 228 10*3/uL (ref 150–400)
RDW: 12.6 % (ref 11.5–15.5)
RDW: 12.4 % (ref 11.5–15.5)
WBC: 8.1 10*3/uL (ref 4.0–10.5)

## 2011-11-16 LAB — POCT I-STAT, CHEM 8
Calcium, Ion: 1.09 mmol/L — ABNORMAL LOW (ref 1.12–1.32)
HCT: 50 % (ref 39.0–52.0)
Hemoglobin: 17 g/dL (ref 13.0–17.0)
TCO2: 25 mmol/L (ref 0–100)

## 2011-11-16 SURGERY — ABDOMINAL AORTAGRAM
Anesthesia: LOCAL

## 2011-11-16 SURGERY — ANGIOGRAM, LOWER EXTREMITY
Anesthesia: LOCAL

## 2011-11-16 MED ORDER — SODIUM CHLORIDE 0.9 % IJ SOLN: 3.0000 mL | Freq: Two times a day (BID) | INTRAMUSCULAR | Status: DC

## 2011-11-16 MED ORDER — ACETAMINOPHEN 325 MG RE SUPP
325.0000 mg | RECTAL | Status: DC | PRN
  Filled 2011-11-16: qty 2

## 2011-11-16 MED ORDER — LABETALOL HCL 5 MG/ML IV SOLN: 10.0000 mg | INTRAVENOUS | Status: DC | PRN

## 2011-11-16 MED ORDER — SODIUM CHLORIDE 0.9 % IV SOLN: 500.0000 mL | INTRAVENOUS | Status: DC

## 2011-11-16 MED ORDER — ASPIRIN 81 MG PO CHEW
324.0000 mg | CHEWABLE_TABLET | ORAL | Status: AC
  Administered 2011-11-16: 324 mg via ORAL
  Filled 2011-11-16: qty 4

## 2011-11-16 MED ORDER — METOPROLOL TARTRATE 1 MG/ML IV SOLN
2.0000 mg | INTRAVENOUS | Status: DC | PRN
Start: 2011-11-16 — End: 2011-11-16

## 2011-11-16 MED ORDER — DOCUSATE SODIUM 100 MG PO CAPS: 100.0000 mg | ORAL_CAPSULE | Freq: Every day | ORAL | Status: DC

## 2011-11-16 MED ORDER — ACETAMINOPHEN 325 MG PO TABS: 325.0000 mg | ORAL_TABLET | ORAL | Status: DC | PRN

## 2011-11-16 MED ORDER — GUAIFENESIN-DM 100-10 MG/5ML PO SYRP
15.0000 mL | ORAL_SOLUTION | ORAL | Status: DC | PRN
  Filled 2011-11-16: qty 15

## 2011-11-16 MED ORDER — PHENOL 1.4 % MT LIQD: 1.0000 | OROMUCOSAL | Status: DC | PRN

## 2011-11-16 MED ORDER — MIDAZOLAM HCL 2 MG/2ML IJ SOLN
INTRAMUSCULAR | Status: AC
  Filled 2011-11-16: qty 2

## 2011-11-16 MED ORDER — FENTANYL CITRATE 0.05 MG/ML IJ SOLN
INTRAMUSCULAR | Status: AC
  Filled 2011-11-16: qty 2

## 2011-11-16 MED ORDER — HEPARIN (PORCINE) IN NACL 2-0.9 UNIT/ML-% IJ SOLN
INTRAMUSCULAR | Status: AC
  Filled 2011-11-16: qty 1000

## 2011-11-16 MED ORDER — SODIUM CHLORIDE 0.9 % IV SOLN: 250.0000 mL | INTRAVENOUS | Status: DC | PRN

## 2011-11-16 MED ORDER — HEPARIN SODIUM (PORCINE) 1000 UNIT/ML IJ SOLN
INTRAMUSCULAR | Status: AC
  Filled 2011-11-16: qty 1

## 2011-11-16 MED ORDER — SODIUM CHLORIDE 0.9 % IJ SOLN: 3.0000 mL | INTRAMUSCULAR | Status: DC | PRN

## 2011-11-16 MED ORDER — HYDRALAZINE HCL 20 MG/ML IJ SOLN
10.0000 mg | INTRAMUSCULAR | Status: DC | PRN
  Filled 2011-11-16: qty 0.5

## 2011-11-16 MED ORDER — CLOPIDOGREL BISULFATE 75 MG PO TABS: 75.0000 mg | ORAL_TABLET | ORAL | Status: DC

## 2011-11-16 MED ORDER — SODIUM CHLORIDE 0.9 % IV SOLN
INTRAVENOUS | Status: DC
  Administered 2011-11-16: 09:00:00 via INTRAVENOUS

## 2011-11-16 MED ORDER — ONDANSETRON HCL 4 MG/2ML IJ SOLN: 4.0000 mg | Freq: Four times a day (QID) | INTRAMUSCULAR | Status: DC | PRN

## 2011-11-16 MED ORDER — LIDOCAINE HCL (PF) 1 % IJ SOLN
INTRAMUSCULAR | Status: AC
  Filled 2011-11-16: qty 30

## 2011-11-16 NOTE — Interval H&P Note (Signed)
History and Physical Interval Note:  11/16/2011 10:14 AM  [name]  has presented today for surgery, with the diagnosis of chest pain  The various methods of treatment have been discussed with the patient and family. After consideration of risks, benefits and other options for treatment, the patient has consented to  Procedure(s): Abdominal Aortogram with lower extremity runoff and possible PTA/stenting as a surgical intervention .  The patients' history has been reviewed, patient examined, no change in status, stable for surgery.  I have reviewed the patients' chart and labs.  Questions were answered to the patient's satisfaction.     Sherren Mocha

## 2011-11-16 NOTE — H&P (View-Only) (Signed)
Patient ID: Douglas Christensen, male   DOB: 10-26-1951, 60 y.o.   MRN: [id number] HPI:  This is a 60 year old gentleman presenting for initial evaluation of peripheral arterial disease.  The patient has developed severe and progressive left leg pain over the past year. He describes numbness with standing and walking. He also has calf tightness and pain. He can walk less than one block without stopping. The patient also describes some dyspnea with exertion. He denies chest pain or pressure. He has no rest pain in his legs. He denies ulceration or nonhealing wounds. He does not have pain in the right leg.  He has known peripheral arterial disease. In 2008 he underwent stenting of the left common iliac artery and the right superficial femoral artery. His left common iliac was treated with an 8 x 24 mm balloon expandable stent. His right superficial femoral artery was treated with multiple long 8 mm self-expanding stents. There was significant recoil noted thereafter right SFA balloon post dilatation. He has clinically done well since his percutaneous interventions until this past year. The patient is a long-standing smoker. He is determined to quit.  Noninvasive studies were done and they show ABIs of 68% on the right at 62% on the left. There is suggestion of severe common femoral artery stenosis on the right and severe inflow disease on the left.  Outpatient Encounter Prescriptions as of 11/10/2011  Medication Sig Dispense Refill  . aspirin EC 81 MG tablet Take 1 tablet (81 mg total) by mouth daily.  100 tablet  3  . atorvastatin (LIPITOR) 10 MG tablet Take 10 mg by mouth every morning.       . benazepril-hydrochlorthiazide (LOTENSIN HCT) 20-25 MG per tablet Take 1 tablet by mouth every morning.        . cholecalciferol (VITAMIN D) 1000 UNITS tablet Take 1,000 Units by mouth daily.        . Cyanocobalamin (VITAMIN B-12) 1000 MCG SUBL Place 1 tablet (1,000 mcg total) under the tongue daily.  100 tablet  3  .  ranitidine (ZANTAC) 150 MG tablet Take 150 mg by mouth daily as needed. For heartburn       . clopidogrel (PLAVIX) 75 MG tablet Take 1 tablet (75 mg total) by mouth daily.  30 tablet  11  . varenicline (CHANTIX CONTINUING MONTH PAK) 1 MG tablet Take 1 tablet (1 mg total) by mouth 2 (two) times daily.  64 tablet  6    Review of patient's allergies indicates no known allergies.  Past Medical History  Diagnosis Date  . History of colonic polyps   . COPD (chronic obstructive pulmonary disease)   . CAD (coronary artery disease)     Mild plaque (cath "years ago")  . GERD (gastroesophageal reflux disease)   . Hyperlipidemia   . Hypertension   . Vitamin D deficiency   . Meralgia paresthetica of left side 2011  . LBP (low back pain)   . PVD (peripheral vascular disease)     Stent to left common femoral and right superficial femoral.  2008.  50%  left renal     Past Surgical History  Procedure Date  . None     History   Social History  . Marital Status: Married    Spouse Name: N/A    Number of Children: N/A  . Years of Education: N/A   Occupational History  . Not on file.   Social History Main Topics  . Smoking status: Current Everyday Smoker -- 2.0  packs/day for 45 years    Types: Cigarettes  . Smokeless tobacco: Not on file  . Alcohol Use: 1.2 oz/week    2 Cans of beer per week  . Drug Use: No  . Sexually Active: Yes   Other Topics Concern  . Not on file   Social History Narrative   Widower    Family History  Problem Relation Age of Onset  . Heart disease Mother     No clear CAD  . Hypertension Mother   . Cancer Mother     ? colon ca  . Cancer Father     brain tumor  . Alzheimer's disease Father     ROS: General: no fevers/chills/night sweats Eyes: no blurry vision, diplopia, or amaurosis ENT: no sore throat or hearing loss Resp: occasional cough, no wheezing or hemoptysis CV: no edema or palpitations GI: no abdominal pain, nausea, vomiting, diarrhea,  or constipation GU: no dysuria, frequency, or hematuria Skin: no rash Neuro: no headache, numbness, tingling, or weakness of extremities Musculoskeletal: no joint pain or swelling Heme: no bleeding, DVT, or easy bruising Endo: no polydipsia or polyuria  BP 120/76  Pulse 96  Resp 18  Ht 5\' 5"  (1.651 m)  Wt 75.805 kg (167 lb 1.9 oz)  BMI 27.81 kg/m2  PHYSICAL EXAM: Pt is alert and oriented, WD, WN, in no distress. HEENT: normal Neck: JVP normal. Carotid upstrokes normal without bruits. No thyromegaly. Lungs: equal expansion, clear bilaterally CV: Apex is discrete and nondisplaced, RRR without murmur or gallop Abd: soft, NT, +BS, no bruit, no hepatosplenomegaly Back: no CVA tenderness Ext: no C/C/E        Femoral pulses 2+ on the right and trace on the left        DP/PT pulses diminished bilaterally Skin: warm and dry without rash Neuro: CNII-XII intact             Strength intact = bilaterally  EKG:  Normal sinus rhythm 96 beats per minute, rightward axis, age indeterminate inferior infarct, age indeterminate anterior infarct.  ASSESSMENT AND PLAN:

## 2011-11-16 NOTE — Op Note (Signed)
Procedure: Abdominal aortic angiogram, left external iliac angiogram with runoff, left external iliac PTA and stenting, right external iliac angiogram with runoff.  Procedural indication: Douglas Christensen is a 60 year old gentleman with known peripheral arterial disease. He presents with a decrease in his ABIs and severe left leg claudication. He was referred for peripheral angiography with possible PTA and stenting. He has previously been treated with a left common iliac stent and multiple overlapping right SFA stents.  Procedural details: Risks and indication of the procedure were reviewed with the patient. Informed consent was obtained. The right groin was prepped, draped, and anesthetized with 1% lidocaine. Using the modified Seldinger technique a 5 French sheath was placed in the right femoral artery. A pigtail catheter was advanced into the suprarenal aorta and an abdominal aortic angiogram was performed under digital subtraction. The pigtail catheter was brought back to the distal aorta where aortoiliac angiography was performed both in the AP view and with RAO angulation. The pigtail catheter was changed out for an endhole catheter after the left common iliac artery had been selectively engaged. The endhole catheter was advanced into the external iliac artery and external iliac angiogram was performed with runoff to the left foot. There was a severe lesion in the distal left external iliac artery of at least 95% stenosis. At that point, a versacore wire was advanced across the lesion and a dock wire was placed. The 5 French sheath was changed out for a 7 [place] destination sheath which was advanced into the left common iliac artery. Weight-based heparin was administered. A therapeutic ACT was achieved. The lesion was predilated with a 5 x 40 mm balloon to 10 atmospheres. There was a long segment of disease in the area was treated in overlapping fashion with the previously placed common iliac stent. The stent  extended from the distal external iliac back to the common iliac artery. A 7 x 60 mm protg ever Flex stent was placed. The stent was then post dilated with a 7 x 40 mustang balloon which was taken to a maximum pressure of 16 atmospheres. There was an excellent angiographic result with no residual stenosis at the completion of the procedure. The sheath was then pulled back using the dilator and a wire into the right external iliac artery. A right external iliac angiogram with runoff to the right foot was performed under digital subtraction.  The patient tolerated the entire procedure well. There were no immediate procedural complications.  Procedural findings: The abdominal aorta is patent. The renal arteries are patent bilaterally. The right common, internal, and extranodal iliac arteries are patent with diffuse calcific disease. The common femoral artery on the right has moderate calcific disease. There is a long segment of overlapping stents throughout the entire superficial femoral artery and the stents are patent throughout. There are areas of focal stenosis up to 70%. The popliteal artery is patent. There is two-vessel runoff to the right foot.  Left leg: The left common iliac stent is patent. The left external iliac has diffuse plaquing with a 95% stenosis in the distal external iliac artery. The common femoral artery is patent. The superficial femoral artery is patent with diffuse calcific nonobstructive disease. The popliteal artery is patent. The deep femoral artery is patent. There is two-vessel runoff to the left foot via the posterior tibial and peroneal arteries.  Final conclusion: Successful stenting of the left external iliac artery using a self-expanding stent.  Recommendations: The patient will be discharged home later today after his bedrest  is complete. He should continue on dual antiplatelet therapy with aspirin and Plavix for at least 30 days.

## 2011-11-17 ENCOUNTER — Other Ambulatory Visit: Payer: Self-pay

## 2011-11-17 DIAGNOSIS — I739 Peripheral vascular disease, unspecified: Secondary | ICD-10-CM

## 2011-12-01 ENCOUNTER — Encounter (INDEPENDENT_AMBULATORY_CARE_PROVIDER_SITE_OTHER): Payer: 59 | Admitting: *Deleted

## 2011-12-01 DIAGNOSIS — I739 Peripheral vascular disease, unspecified: Secondary | ICD-10-CM

## 2011-12-06 ENCOUNTER — Ambulatory Visit: Payer: 59 | Admitting: Internal Medicine

## 2011-12-15 ENCOUNTER — Telehealth: Payer: Self-pay | Admitting: Cardiovascular Disease

## 2011-12-15 ENCOUNTER — Encounter: Payer: Self-pay | Admitting: Cardiovascular Disease

## 2011-12-15 ENCOUNTER — Ambulatory Visit (INDEPENDENT_AMBULATORY_CARE_PROVIDER_SITE_OTHER): Payer: 59 | Admitting: Cardiovascular Disease

## 2011-12-15 VITALS — BP 142/80 | HR 84 | Ht 65.0 in | Wt 169.8 lb

## 2011-12-15 DIAGNOSIS — I70219 Atherosclerosis of native arteries of extremities with intermittent claudication, unspecified extremity: Secondary | ICD-10-CM

## 2011-12-15 NOTE — Assessment & Plan Note (Signed)
I reinforced the importance of tobacco cessation with the patient today as it relates to his peripheral arterial disease and need for repeat interventions.

## 2011-12-15 NOTE — Assessment & Plan Note (Addendum)
I am pleased that the patient's clinical response to left iliac stenting as he now is able to walk without symptoms. He will return in 6 months with ABIs at that time. We are talking to him about the EUCLID study which randomizes patients with PAD to monotherapy with aspirin versus brilinta.

## 2011-12-15 NOTE — Telephone Encounter (Signed)
Douglas Christensen w/ cardiology research at [facility] calling wanting to inform us that pt would like ABI scheduled on Monday afternoon. Please return call to discuss further if necessary.

## 2011-12-15 NOTE — Patient Instructions (Addendum)
Your physician wants you to follow-up in: 6 MONTHS.  You will receive a reminder letter in the mail two months in advance. If you don't receive a letter, please call our office to schedule the follow-up appointment.  Your physician has requested that you have an ankle brachial index (ABI) in 6 MONTHS. During this test an ultrasound and blood pressure cuff are used to evaluate the arteries that supply the arms and legs with blood. Allow thirty minutes for this exam. There are no restrictions or special instructions.  Your physician has recommended you make the following change in your medication: STOP Plavix, Continue Aspirin 81mg  once a day

## 2011-12-15 NOTE — Progress Notes (Signed)
Addended by: Barkley Boards on: 12/15/2011 02:09 PM   Modules accepted: Orders

## 2011-12-15 NOTE — Progress Notes (Signed)
HPI:  61 year old gentleman with lower extremity peripheral arterial disease presented for followup evaluation. He presented last month with severe lifestyle limiting intermittent claudication. He underwent angiography and stenting of the left external iliac artery.  The patient reports complete resolution of his leg pain with walking. He is no longer limited and he feels like even the right leg is better because he does not have to favor it is much. He has been compliant with his medical therapy. He had ABIs done about 2 weeks ago and they showed an increase in his left ABI now up to 0.9. The right ABI was stable at 0.77.  His dyspnea is stable. He denies chest pain or other complaints.  He is going to start Chantix this weekend and is hoping to quit smoking next week.  Outpatient Encounter Prescriptions as of 12/15/2011  Medication Sig Dispense Refill  . aspirin EC 81 MG tablet Take 1 tablet (81 mg total) by mouth daily.  100 tablet  3  . atorvastatin (LIPITOR) 10 MG tablet Take 10 mg by mouth every morning.       . benazepril-hydrochlorthiazide (LOTENSIN HCT) 20-25 MG per tablet Take 1 tablet by mouth every morning.        . cholecalciferol (VITAMIN D) 1000 UNITS tablet Take 1,000 Units by mouth daily.        . clopidogrel (PLAVIX) 75 MG tablet Take 1 tablet (75 mg total) by mouth daily.  30 tablet  11  . Cyanocobalamin (VITAMIN B-12) 1000 MCG SUBL Place 1 tablet (1,000 mcg total) under the tongue daily.  100 tablet  3  . ranitidine (ZANTAC) 150 MG tablet Take 150 mg by mouth daily as needed. For heartburn       . sildenafil (VIAGRA) 50 MG tablet Take 50 mg by mouth daily as needed.      . varenicline (CHANTIX CONTINUING MONTH PAK) 1 MG tablet Take 1 tablet (1 mg total) by mouth 2 (two) times daily.  64 tablet  6    No Known Allergies  Past Medical History  Diagnosis Date  . History of colonic polyps   . COPD (chronic obstructive pulmonary disease)   . CAD (coronary artery disease)     Mild  plaque (cath "years ago")  . GERD (gastroesophageal reflux disease)   . Hyperlipidemia   . Hypertension   . Vitamin D deficiency   . Meralgia paresthetica of left side 2011  . LBP (low back pain)   . PVD (peripheral vascular disease)     Stent to left common femoral and right superficial femoral.  2008.  50%  left renal    ROS: Negative except as per HPI  Ht 5\' 5"  (1.651 m)  Wt 77.021 kg (169 lb 12.8 oz)  BMI 28.26 kg/m2  PHYSICAL EXAM: Pt is alert and oriented, NAD HEENT: normal Neck: JVP - normal, carotids 2+= without bruits Lungs: CTA bilaterally CV: RRR without murmur or gallop Abd: soft, NT, Positive BS, no hepatomegaly Ext: no C/C/E, distal pulses intact and equal Skin: warm/dry no rash  ASSESSMENT AND PLAN:

## 2011-12-15 NOTE — Telephone Encounter (Signed)
I spoke with [place] and made her aware of pt needing repeat ABI.  She will contact the patient.

## 2012-01-02 ENCOUNTER — Encounter: Payer: 59 | Admitting: Cardiology

## 2012-01-02 ENCOUNTER — Encounter (INDEPENDENT_AMBULATORY_CARE_PROVIDER_SITE_OTHER): Payer: 59 | Admitting: Cardiology

## 2012-01-02 DIAGNOSIS — R0989 Other specified symptoms and signs involving the circulatory and respiratory systems: Secondary | ICD-10-CM

## 2012-01-02 DIAGNOSIS — I70219 Atherosclerosis of native arteries of extremities with intermittent claudication, unspecified extremity: Secondary | ICD-10-CM

## 2012-01-11 ENCOUNTER — Other Ambulatory Visit: Payer: Self-pay | Admitting: *Deleted

## 2012-01-11 DIAGNOSIS — Z006 Encounter for examination for normal comparison and control in clinical research program: Secondary | ICD-10-CM | POA: Insufficient documentation

## 2012-01-23 ENCOUNTER — Other Ambulatory Visit: Payer: Self-pay | Admitting: Internal Medicine

## 2012-02-08 ENCOUNTER — Other Ambulatory Visit: Payer: Self-pay | Admitting: *Deleted

## 2012-02-08 DIAGNOSIS — Z006 Encounter for examination for normal comparison and control in clinical research program: Secondary | ICD-10-CM

## 2012-02-08 MED ORDER — AMBULATORY NON FORMULARY MEDICATION: Status: DC

## 2012-02-23 ENCOUNTER — Other Ambulatory Visit: Payer: Self-pay

## 2012-02-23 ENCOUNTER — Telehealth: Payer: Self-pay | Admitting: Cardiology

## 2012-02-23 DIAGNOSIS — F17200 Nicotine dependence, unspecified, uncomplicated: Secondary | ICD-10-CM

## 2012-02-23 MED ORDER — VARENICLINE TARTRATE 1 MG PO TABS: 1.0000 mg | ORAL_TABLET | Freq: Two times a day (BID) | ORAL | Status: AC

## 2012-02-23 NOTE — Telephone Encounter (Signed)
Pt needs a starter pack for Chantix

## 2012-02-23 NOTE — Telephone Encounter (Signed)
Chantix rx sent to pharmacy electronically

## 2012-02-27 ENCOUNTER — Encounter: Payer: Self-pay | Admitting: Gastroenterology

## 2012-02-28 ENCOUNTER — Other Ambulatory Visit: Payer: Self-pay | Admitting: Internal Medicine

## 2012-05-06 ENCOUNTER — Other Ambulatory Visit: Payer: Self-pay | Admitting: Internal Medicine

## 2012-06-06 ENCOUNTER — Other Ambulatory Visit: Payer: Self-pay | Admitting: Internal Medicine

## 2012-06-18 ENCOUNTER — Encounter (INDEPENDENT_AMBULATORY_CARE_PROVIDER_SITE_OTHER): Payer: 59

## 2012-06-18 ENCOUNTER — Encounter: Payer: Self-pay | Admitting: Cardiovascular Disease

## 2012-06-18 ENCOUNTER — Ambulatory Visit (INDEPENDENT_AMBULATORY_CARE_PROVIDER_SITE_OTHER): Payer: 59 | Admitting: Cardiovascular Disease

## 2012-06-18 VITALS — BP 115/68 | HR 57 | Ht 65.0 in | Wt 164.0 lb

## 2012-06-18 DIAGNOSIS — I70219 Atherosclerosis of native arteries of extremities with intermittent claudication, unspecified extremity: Secondary | ICD-10-CM

## 2012-06-18 DIAGNOSIS — I739 Peripheral vascular disease, unspecified: Secondary | ICD-10-CM

## 2012-06-18 DIAGNOSIS — I2510 Atherosclerotic heart disease of native coronary artery without angina pectoris: Secondary | ICD-10-CM

## 2012-06-18 NOTE — Assessment & Plan Note (Addendum)
The patient had ABIs performed today. These were reviewed and they are 64% on the right and 93% on the left. This is compared to a study from February 2013 when his right ABI was 0.66 and the left was 0.98. We will continue with his current medical management as his ABIs are stable and his symptoms are minimal. He will return in 6 months for followup ABI and duplex scan and then will be seen back in the office in 12 months. I reviewed the critical importance of tobacco cessation with the patient. The patient is receiving antiplatelet Rx through the [facility] trial (clopidogrel versus ticagrelor).

## 2012-06-18 NOTE — Patient Instructions (Signed)
Your physician wants you to follow-up in: 12 monthsYou will receive a reminder letter in the mail two months in advance. If you don't receive a letter, please call our office to schedule the follow-up appointment.  Your physician has requested that you have an ankle brachial index (ABI). During this test an ultrasound and blood pressure cuff are used to evaluate the arteries that supply the arms and legs with blood. Allow thirty minutes for this exam. There are no restrictions or special instructions.  Call backs in 6 months for ABI's.

## 2012-06-18 NOTE — Progress Notes (Signed)
   HPI:  61 year old gentleman presenting for followup of lower extremity peripheral arterial disease. The patient is followed for lower extremity peripheral arterial disease. He has had remote stenting of the right superficial femoral artery. In December 2012 he underwent stenting of the left external iliac artery. He complains of occasional numbness in the left leg with walking, but this occurs at variable distances. He denies right leg pain. He's had no rest pain or ulceration. He denies chest pain or dyspnea. He continues to smoke and he is trying to quit. He complains of low back and hip pain. This is unchanged over time the  Outpatient Encounter Prescriptions as of 06/18/2012  Medication Sig Dispense Refill  . AMBULATORY NON FORMULARY MEDICATION EUCLID RESEARCH STUDY. Plavix vs Brilinta.      Marland Kitchen atenolol (TENORMIN) 50 MG tablet TAKE 1 TABLET BY MOUTH ONCE DAILY  90 tablet  0  . atorvastatin (LIPITOR) 10 MG tablet TAKE 1 TABLET BY MOUTH ONCE DAILY FOR CHOLESTEROL  90 tablet  0  . benazepril-hydrochlorthiazide (LOTENSIN HCT) 20-25 MG per tablet TAKE 1 TABLET BY MOUTH EVERY MORNING  90 tablet  0  . cholecalciferol (VITAMIN D) 1000 UNITS tablet Take 1,000 Units by mouth daily.        . Cyanocobalamin (VITAMIN B-12) 1000 MCG SUBL Place 1 tablet (1,000 mcg total) under the tongue daily.  100 tablet  3  . ranitidine (ZANTAC) 150 MG tablet Take 150 mg by mouth daily as needed. For heartburn       . sildenafil (VIAGRA) 50 MG tablet Take 50 mg by mouth daily as needed.      Marland Kitchen VIAGRA 100 MG tablet TAKE 1 TABLET BY MOUTH ONCE DAILY AS NEEDED  12 tablet  0    No Known Allergies  Past Medical History  Diagnosis Date  . History of colonic polyps   . COPD (chronic obstructive pulmonary disease)   . CAD (coronary artery disease)     Mild plaque (cath "years ago")  . GERD (gastroesophageal reflux disease)   . Hyperlipidemia   . Hypertension   . Vitamin d deficiency   . Meralgia paresthetica of left  side 2011  . LBP (low back pain)   . PVD (peripheral vascular disease)     Stent to left common femoral and right superficial femoral.  2008.  50%  left renal     ROS: Negative except as per HPI  BP 115/68  Pulse 57  Ht 5\' 5"  (1.651 m)  Wt 74.39 kg (164 lb)  BMI 27.29 kg/m2  PHYSICAL EXAM: Pt is alert and oriented, NAD HEENT: normal Neck: JVP - normal, carotids 2+= without bruits Lungs: CTA bilaterally CV: RRR without murmur or gallop Abd: soft, NT, Positive BS, no hepatomegaly Ext: no C/C/E, 1+ on the left DP and PT pulses and trace DP and PT pulses on the right Skin: warm/dry no rash  EKG:  Sinus bradycardia 58 beats per minute, incomplete right bundle branch block, inferior infarct age undetermined.  ASSESSMENT AND PLAN:

## 2012-06-20 ENCOUNTER — Emergency Department (HOSPITAL_BASED_OUTPATIENT_CLINIC_OR_DEPARTMENT_OTHER): Payer: 59

## 2012-06-20 ENCOUNTER — Encounter (HOSPITAL_BASED_OUTPATIENT_CLINIC_OR_DEPARTMENT_OTHER): Payer: Self-pay | Admitting: *Deleted

## 2012-06-20 ENCOUNTER — Emergency Department (HOSPITAL_BASED_OUTPATIENT_CLINIC_OR_DEPARTMENT_OTHER)
Admission: EM | Admit: 2012-06-20 | Discharge: 2012-06-20 | Disposition: A | Discharge status: Home / Self Care | Payer: 59 | Attending: Emergency Medicine | Admitting: Emergency Medicine

## 2012-06-20 DIAGNOSIS — J4489 Other specified chronic obstructive pulmonary disease: Secondary | ICD-10-CM | POA: Insufficient documentation

## 2012-06-20 DIAGNOSIS — J449 Chronic obstructive pulmonary disease, unspecified: Secondary | ICD-10-CM | POA: Insufficient documentation

## 2012-06-20 DIAGNOSIS — F17200 Nicotine dependence, unspecified, uncomplicated: Secondary | ICD-10-CM | POA: Insufficient documentation

## 2012-06-20 DIAGNOSIS — K219 Gastro-esophageal reflux disease without esophagitis: Secondary | ICD-10-CM | POA: Insufficient documentation

## 2012-06-20 DIAGNOSIS — Z79899 Other long term (current) drug therapy: Secondary | ICD-10-CM | POA: Insufficient documentation

## 2012-06-20 DIAGNOSIS — M549 Dorsalgia, unspecified: Secondary | ICD-10-CM

## 2012-06-20 DIAGNOSIS — N390 Urinary tract infection, site not specified: Secondary | ICD-10-CM

## 2012-06-20 DIAGNOSIS — I10 Essential (primary) hypertension: Secondary | ICD-10-CM | POA: Insufficient documentation

## 2012-06-20 DIAGNOSIS — I2510 Atherosclerotic heart disease of native coronary artery without angina pectoris: Secondary | ICD-10-CM | POA: Insufficient documentation

## 2012-06-20 LAB — URINE MICROSCOPIC-ADD ON

## 2012-06-20 LAB — URINALYSIS, ROUTINE W REFLEX MICROSCOPIC
Glucose, UA: NEGATIVE mg/dL
Hgb urine dipstick: NEGATIVE
Specific Gravity, Urine: 1.028 (ref 1.005–1.030)
Urobilinogen, UA: 1 mg/dL (ref 0.0–1.0)

## 2012-06-20 MED ORDER — CEPHALEXIN 500 MG PO CAPS: 500.0000 mg | ORAL_CAPSULE | Freq: Four times a day (QID) | ORAL | Status: AC

## 2012-06-20 MED ORDER — KETOROLAC TROMETHAMINE 60 MG/2ML IM SOLN
60.0000 mg | Freq: Once | INTRAMUSCULAR | Status: AC
  Administered 2012-06-20: 60 mg via INTRAMUSCULAR
  Filled 2012-06-20: qty 2

## 2012-06-20 MED ORDER — CEPHALEXIN 250 MG PO CAPS
500.0000 mg | ORAL_CAPSULE | Freq: Once | ORAL | Status: AC
  Administered 2012-06-20: 500 mg via ORAL
  Filled 2012-06-20: qty 2

## 2012-06-20 MED ORDER — OXYCODONE-ACETAMINOPHEN 5-325 MG PO TABS: 2.0000 | ORAL_TABLET | ORAL | Status: AC | PRN

## 2012-06-20 NOTE — ED Notes (Signed)
Patient states he developed sudden left lower back pain yesterday at approximately 1630 pm.  No known injury.  No history.

## 2012-06-21 NOTE — ED Provider Notes (Signed)
History     CSN: [id number]  Arrival date & time 06/20/12  [id number]   First MD Initiated Contact with Patient 06/20/12 1200      Chief Complaint  Patient presents with  . Back Pain    left    (Consider location/radiation/quality/duration/timing/severity/associated sxs/prior treatment) HPI  Left lower back pain began yesterday.  No known injury.  Continues with movement.  No uti symptoms, no history of same.  Patient without redness, swelling, dyspnea, cough, or abdominal pain.  Patient taking tylenol without relief.  Past Medical History  Diagnosis Date  . History of colonic polyps   . COPD (chronic obstructive pulmonary disease)   . CAD (coronary artery disease)     Mild plaque (cath "years ago")  . GERD (gastroesophageal reflux disease)   . Hyperlipidemia   . Hypertension   . Vitamin d deficiency   . Meralgia paresthetica of left side 2011  . LBP (low back pain)   . PVD (peripheral vascular disease)     Stent to left common femoral and right superficial femoral.  2008.  50%  left renal     Past Surgical History  Procedure Date  . None   . Lower extremity stents     bilateral lower extremities    Family History  Problem Relation Age of Onset  . Heart disease Mother     No clear CAD  . Hypertension Mother   . Cancer Mother     ? colon ca  . Cancer Father     brain tumor  . Alzheimer's disease Father     History  Substance Use Topics  . Smoking status: Current Everyday Smoker -- 1.0 packs/day for 45 years    Types: Cigarettes  . Smokeless tobacco: Not on file  . Alcohol Use: 1.2 oz/week    2 Cans of beer per week      Review of Systems  Allergies  Review of patient's allergies indicates no known allergies.  Home Medications   Current Outpatient Rx  Name Route Sig Dispense Refill  . AMBULATORY NON FORMULARY MEDICATION  EUCLID RESEARCH STUDY. Plavix vs Brilinta.    . ATENOLOL 50 MG PO TABS  TAKE 1 TABLET BY MOUTH ONCE DAILY 90 tablet 0    Due for  yearly follow-up in September no additiona ...  . ATORVASTATIN CALCIUM 10 MG PO TABS  TAKE 1 TABLET BY MOUTH ONCE DAILY FOR CHOLESTEROL 90 tablet 0  . BENAZEPRIL-HYDROCHLOROTHIAZIDE 20-25 MG PO TABS  TAKE 1 TABLET BY MOUTH EVERY MORNING 90 tablet 0  . CEPHALEXIN 500 MG PO CAPS Oral Take 1 capsule (500 mg total) by mouth 4 (four) times daily. 28 capsule 0  . VITAMIN D 1000 UNITS PO TABS Oral Take 1,000 Units by mouth daily.      Marland Kitchen VITAMIN B-12 1000 MCG SL SUBL Sublingual Place 1 tablet (1,000 mcg total) under the tongue daily. 100 tablet 3  . OXYCODONE-ACETAMINOPHEN 5-325 MG PO TABS Oral Take 2 tablets by mouth every 4 (four) hours as needed for pain. 6 tablet 0  . RANITIDINE HCL 150 MG PO TABS Oral Take 150 mg by mouth daily as needed. For heartburn     . SILDENAFIL CITRATE 50 MG PO TABS Oral Take 50 mg by mouth daily as needed.    Marland Kitchen VIAGRA 100 MG PO TABS  TAKE 1 TABLET BY MOUTH ONCE DAILY AS NEEDED 12 tablet 0    Needs OV for future refills.    BP 145/51  Pulse 71  Temp 98.2 F (36.8 C) (Oral)  Resp 20  Ht 5\' 5"  (1.651 m)  Wt 164 lb 4 oz (74.503 kg)  BMI 27.33 kg/m2  SpO2 98%  Physical Exam  Nursing note and vitals reviewed. Constitutional: He is oriented to person, place, and time. He appears well-developed and well-nourished.  HENT:  Head: Normocephalic and atraumatic.  Right Ear: External ear normal.  Left Ear: External ear normal.  Nose: Nose normal.  Mouth/Throat: Oropharynx is clear and moist.  Eyes: Conjunctivae and EOM are normal. Pupils are equal, round, and reactive to light.  Neck: Normal range of motion. Neck supple.  Cardiovascular: Normal rate, regular rhythm, normal heart sounds and intact distal pulses.   Pulmonary/Chest: Effort normal and breath sounds normal.  Abdominal: Soft. Bowel sounds are normal.  Musculoskeletal: Normal range of motion.       Left paralumbar ttp. Full arom back.  Neurological: He is alert and oriented to person, place, and time. He has  normal reflexes.  Skin: Skin is warm and dry.  Psychiatric: He has a normal mood and affect. His behavior is normal. Thought content normal.    ED Course  Procedures (including critical care time)  Labs Reviewed  URINALYSIS, ROUTINE W REFLEX MICROSCOPIC - Abnormal; Notable for the following:    Color, Urine AMBER (*)  BIOCHEMICALS MAY BE AFFECTED BY COLOR   Bilirubin Urine SMALL (*)     Ketones, ur 15 (*)     Leukocytes, UA SMALL (*)     All other components within normal limits  URINE MICROSCOPIC-ADD ON - Abnormal; Notable for the following:    Bacteria, UA MANY (*)     All other components within normal limits  URINE CULTURE   Ct Abdomen Pelvis Wo Contrast  06/20/2012  *RADIOLOGY REPORT*  Clinical Data: Left back and flank pain.  Hematuria.  CT ABDOMEN AND PELVIS WITHOUT CONTRAST  Technique:  Multidetector CT imaging of the abdomen and pelvis was performed following the standard protocol without intravenous contrast.  Comparison: None.  Findings: No renal or ureteral calculi or findings to suggest renal collecting system obstruction.  Within the right kidney there are three low density structures measuring up to 3.6 cm.  The larger two are clearly cysts.  Smaller one too small to adequately characterized although statistically likely a cyst.  Mild hazy infiltration of fat planes perirenal region bilaterally may reflect of prior inflammation or obstruction.  The urinary bladder is under distended.  No gross abnormality noted.  If the patient has persistent unexplained hematuria, further evaluation will be necessary.  Coarse calcifications within normal sized slightly lobulated prostate gland.  Prominent coronary artery calcifications.  Prominent calcification and ectasia of the abdominal aorta.  Aortic bulge measures up to 2.6 cm.  Atherosclerotic type changes with narrowing of the celiac artery, superior mesenteric artery and renal arteries.  Prominent calcification with mild ectasia of the  iliac arteries and the femoral arteries.  Left common iliac artery measures up to 1.7 cm.  Right common iliac artery measures up to 1.5 cm. Calcification causes significant narrowing of the left common iliac artery, left external iliac artery and at the junction of the right external iliac artery/femoral artery.  Evaluation of solid abdominal viscera is limited by lack of IV contrast.  Taking this limitation into account no focal splenic, hepatic, pancreatic or adrenal lesion.  Dense appearance of the gallbladder may represent sludge without calcified gallstone.  Fatty containing inguinal hernias.  Small bowel / appendix  extends into the upper aspect of the right inguinal hernia.  No extraluminal bowel inflammatory process, free fluid or free air.  No bony destructive lesion.  IMPRESSION: No renal or ureteral calculi or findings to suggest renal collecting system obstruction.  Significant atherosclerotic type changes as detailed above.  Original Report Authenticated By: Doug Sou, M.D.     1. UTI (lower urinary tract infection)   2. Back pain       MDM        Shaune Pollack, MD 06/21/12 [phone]

## 2012-06-22 LAB — URINE CULTURE: Culture: NO GROWTH

## 2012-06-25 ENCOUNTER — Other Ambulatory Visit: Payer: Self-pay | Admitting: Internal Medicine

## 2012-06-29 ENCOUNTER — Telehealth: Payer: Self-pay | Admitting: *Deleted

## 2012-06-29 DIAGNOSIS — Z0389 Encounter for observation for other suspected diseases and conditions ruled out: Secondary | ICD-10-CM

## 2012-06-29 DIAGNOSIS — Z0000 Encounter for general adult medical examination without abnormal findings: Secondary | ICD-10-CM

## 2012-06-29 NOTE — Telephone Encounter (Signed)
Labs entered.

## 2012-06-29 NOTE — Telephone Encounter (Signed)
Message copied by Cresenciano Lick on Fri Jun 29, 2012  4:16 PM ------      Message from: Sherral Hammers      Created: Mon Jun 25, 2012  1:20 PM      Regarding: LABS       He couldn't come the week of aug 12 so I made him a physical appt on Oct. 4.  He should have meds to last that long.  Could you put in the labs, please?

## 2012-08-22 ENCOUNTER — Ambulatory Visit (INDEPENDENT_AMBULATORY_CARE_PROVIDER_SITE_OTHER): Payer: 59 | Admitting: Internal Medicine

## 2012-08-22 ENCOUNTER — Encounter: Payer: Self-pay | Admitting: Internal Medicine

## 2012-08-22 VITALS — BP 118/70 | HR 82 | Temp 97.9°F | Ht 65.0 in | Wt 163.1 lb

## 2012-08-22 DIAGNOSIS — I10 Essential (primary) hypertension: Secondary | ICD-10-CM

## 2012-08-22 DIAGNOSIS — J449 Chronic obstructive pulmonary disease, unspecified: Secondary | ICD-10-CM

## 2012-08-22 DIAGNOSIS — J209 Acute bronchitis, unspecified: Secondary | ICD-10-CM

## 2012-08-22 MED ORDER — AZITHROMYCIN 250 MG PO TABS: ORAL_TABLET | ORAL | Status: DC

## 2012-08-22 MED ORDER — PROMETHAZINE HCL 25 MG PO TABS: 25.0000 mg | ORAL_TABLET | Freq: Four times a day (QID) | ORAL | Status: DC | PRN

## 2012-08-22 MED ORDER — HYDROCODONE-HOMATROPINE 5-1.5 MG/5ML PO SYRP: 5.0000 mL | ORAL_SOLUTION | Freq: Four times a day (QID) | ORAL | Status: DC | PRN

## 2012-08-22 NOTE — Progress Notes (Signed)
Subjective:    Patient ID: Douglas Christensen, male    DOB: 14-Apr-1951, 61 y.o.   MRN: [id number]  HPI Here with acute onset mild to mod 2-3 days ST, HA, general weakness and malaise, with prod cough greenish sputum, but Pt denies chest pain, increased sob or doe, wheezing, orthopnea, PND, increased LE swelling, palpitations, dizziness or syncope. Has some loose stool as well, no blood, abd pain, but has had some nausea as well.  Pt denies new neurological symptoms such as new headache, or facial or extremity weakness or numbness   Pt denies polydipsia, polyuria, Girlfriend was ill with similar last wk now improved.  Past Medical History  Diagnosis Date  . History of colonic polyps   . COPD (chronic obstructive pulmonary disease)   . CAD (coronary artery disease)     Mild plaque (cath "years ago")  . GERD (gastroesophageal reflux disease)   . Hyperlipidemia   . Hypertension   . Vitamin d deficiency   . Meralgia paresthetica of left side 2011  . LBP (low back pain)   . PVD (peripheral vascular disease)     Stent to left common femoral and right superficial femoral.  2008.  50%  left renal    Past Surgical History  Procedure Date  . None   . Lower extremity stents     bilateral lower extremities    reports that he has been smoking Cigarettes.  He has a 45 pack-year smoking history. He does not have any smokeless tobacco history on file. He reports that he drinks about 1.2 ounces of alcohol per week. He reports that he does not use illicit drugs. family history includes Alzheimer's disease in his father; Cancer in his father and mother; Heart disease in his mother; and Hypertension in his mother. No Known Allergies Current Outpatient Prescriptions on File Prior to Visit  Medication Sig Dispense Refill  . AMBULATORY NON FORMULARY MEDICATION EUCLID RESEARCH STUDY. Plavix vs Brilinta.      Marland Kitchen atenolol (TENORMIN) 50 MG tablet TAKE 1 TABLET BY MOUTH ONCE DAILY  90 tablet  0  . atorvastatin (LIPITOR)  10 MG tablet TAKE 1 TABLET BY MOUTH ONCE DAILY FOR CHOLESTEROL  90 tablet  0  . benazepril-hydrochlorthiazide (LOTENSIN HCT) 20-25 MG per tablet TAKE 1 TABLET BY MOUTH EVERY MORNING  90 tablet  0  . cholecalciferol (VITAMIN D) 1000 UNITS tablet Take 1,000 Units by mouth daily.        . Cyanocobalamin (VITAMIN B-12) 1000 MCG SUBL Place 1 tablet (1,000 mcg total) under the tongue daily.  100 tablet  3  . ranitidine (ZANTAC) 150 MG tablet Take 150 mg by mouth daily as needed. For heartburn       . sildenafil (VIAGRA) 50 MG tablet Take 50 mg by mouth daily as needed.      Marland Kitchen VIAGRA 100 MG tablet TAKE 1 TABLET BY MOUTH ONCE DAILY AS NEEDED  12 tablet  0  . promethazine (PHENERGAN) 25 MG tablet Take 1 tablet (25 mg total) by mouth every 6 (six) hours as needed for nausea.  30 tablet  0   Review of Systems  Constitutional: Negative for diaphoresis and unexpected weight change.  HENT: Negative for tinnitus.   Eyes: Negative for photophobia and visual disturbance.  Respiratory: Negative for choking and stridor.   Gastrointestinal: Negative for  blood in stool; did vomit x 1 yesterday am.  Genitourinary: Negative for hematuria and decreased urine volume.  Musculoskeletal: Negative for gait problem.  Skin: Negative for color change and wound.  Neurological: Negative for tremors and numbness.  Psychiatric/Behavioral: Negative for decreased concentration. The patient is not hyperactive.       Objective:   Physical Exam BP 118/70  Pulse 82  Temp 97.9 F (36.6 C) (Oral)  Ht 5\' 5"  (1.651 m)  Wt 163 lb 2 oz (73.993 kg)  BMI 27.15 kg/m2  SpO2 97% Physical Exam  VS noted Constitutional: Pt appears well-developed and well-nourished.  HENT: Head: Normocephalic.  Right Ear: External ear normal.  Left Ear: External ear normal.  Bilat tm's mild erythema.  Sinus nontender.  Pharynx mild erythema Eyes: Conjunctivae and EOM are normal. Pupils are equal, round, and reactive to light.  Neck: Normal range of  motion. Neck supple.  Cardiovascular: Normal rate and regular rhythm.   Pulmonary/Chest: Effort normal and breath sounds mld decreased, no rales or wheezing.  Abd:  Soft, NT, non-distended, + BS Neurological: Pt is alert. Not confused  Skin: Skin is warm. No erythema.  Psychiatric: Pt behavior is normal. Thought content normal.     Assessment & Plan:

## 2012-08-22 NOTE — Assessment & Plan Note (Signed)
Mild to mod, for antibx course,  to f/u any worsening symptoms or concerns 

## 2012-08-22 NOTE — Assessment & Plan Note (Signed)
stable overall by hx and exam, most recent data reviewed with pt, and pt to continue medical treatment as before BP Readings from Last 3 Encounters:  08/22/12 118/70  06/20/12 145/51  06/18/12 115/68

## 2012-08-22 NOTE — Assessment & Plan Note (Signed)
stable overall by hx and exam, most recent data reviewed with pt, and pt to continue medical treatment as before SpO2 Readings from Last 3 Encounters:  08/22/12 97%  06/20/12 98%  11/16/11 100%

## 2012-08-22 NOTE — Patient Instructions (Addendum)
Take all new medications as prescribed Continue all other medications as before  

## 2012-08-28 ENCOUNTER — Other Ambulatory Visit (INDEPENDENT_AMBULATORY_CARE_PROVIDER_SITE_OTHER): Payer: 59

## 2012-08-28 DIAGNOSIS — Z0000 Encounter for general adult medical examination without abnormal findings: Secondary | ICD-10-CM

## 2012-08-28 DIAGNOSIS — Z0389 Encounter for observation for other suspected diseases and conditions ruled out: Secondary | ICD-10-CM

## 2012-08-28 LAB — CBC WITH DIFFERENTIAL/PLATELET
Basophils Relative: 0.7 % (ref 0.0–3.0)
Eosinophils Relative: 3.9 % (ref 0.0–5.0)
Lymphocytes Relative: 40.6 % (ref 12.0–46.0)
MCV: 102.1 fl — ABNORMAL HIGH (ref 78.0–100.0)
Monocytes Absolute: 0.6 10*3/uL (ref 0.1–1.0)
Neutrophils Relative %: 46.2 % (ref 43.0–77.0)
RBC: 5.3 Mil/uL (ref 4.22–5.81)
WBC: 6.5 10*3/uL (ref 4.5–10.5)

## 2012-08-28 LAB — LIPID PANEL
HDL: 36.5 mg/dL — ABNORMAL LOW (ref >39.00)
Total CHOL/HDL Ratio: 6
Triglycerides: 291 mg/dL — ABNORMAL HIGH (ref 0.0–149.0)
VLDL: 58.2 mg/dL — ABNORMAL HIGH (ref 0.0–40.0)

## 2012-08-28 LAB — HEPATIC FUNCTION PANEL
Alkaline Phosphatase: 61 U/L (ref 39–117)
Bilirubin, Direct: 0.2 mg/dL (ref 0.0–0.3)

## 2012-08-28 LAB — URINALYSIS, ROUTINE W REFLEX MICROSCOPIC
Nitrite: NEGATIVE
Specific Gravity, Urine: 1.01 (ref 1.000–1.030)
Total Protein, Urine: NEGATIVE
Urine Glucose: NEGATIVE
Urobilinogen, UA: 0.2 (ref 0.0–1.0)

## 2012-08-28 LAB — BASIC METABOLIC PANEL
Calcium: 9.5 mg/dL (ref 8.4–10.5)
GFR: 89.88 mL/min (ref >60.00)
Sodium: 132 mEq/L — ABNORMAL LOW (ref 135–145)

## 2012-08-31 ENCOUNTER — Encounter: Payer: Self-pay | Admitting: Internal Medicine

## 2012-08-31 ENCOUNTER — Ambulatory Visit (INDEPENDENT_AMBULATORY_CARE_PROVIDER_SITE_OTHER): Payer: 59 | Admitting: Internal Medicine

## 2012-08-31 ENCOUNTER — Other Ambulatory Visit: Payer: 59

## 2012-08-31 ENCOUNTER — Other Ambulatory Visit: Payer: Self-pay | Admitting: *Deleted

## 2012-08-31 VITALS — BP 120/74 | HR 84 | Temp 98.3°F | Resp 16 | Ht 65.0 in | Wt 164.0 lb

## 2012-08-31 DIAGNOSIS — I10 Essential (primary) hypertension: Secondary | ICD-10-CM

## 2012-08-31 DIAGNOSIS — D45 Polycythemia vera: Secondary | ICD-10-CM

## 2012-08-31 DIAGNOSIS — I739 Peripheral vascular disease, unspecified: Secondary | ICD-10-CM

## 2012-08-31 DIAGNOSIS — I2510 Atherosclerotic heart disease of native coronary artery without angina pectoris: Secondary | ICD-10-CM

## 2012-08-31 DIAGNOSIS — M545 Low back pain, unspecified: Secondary | ICD-10-CM

## 2012-08-31 DIAGNOSIS — D751 Secondary polycythemia: Secondary | ICD-10-CM

## 2012-08-31 DIAGNOSIS — E785 Hyperlipidemia, unspecified: Secondary | ICD-10-CM

## 2012-08-31 DIAGNOSIS — R5381 Other malaise: Secondary | ICD-10-CM

## 2012-08-31 DIAGNOSIS — Z0000 Encounter for general adult medical examination without abnormal findings: Secondary | ICD-10-CM

## 2012-08-31 DIAGNOSIS — E538 Deficiency of other specified B group vitamins: Secondary | ICD-10-CM

## 2012-08-31 MED ORDER — LOSARTAN POTASSIUM-HCTZ 100-25 MG PO TABS: 1.0000 | ORAL_TABLET | Freq: Every day | ORAL | Status: DC

## 2012-08-31 MED ORDER — FLUTICASONE-SALMETEROL 100-50 MCG/DOSE IN AEPB: 1.0000 | INHALATION_SPRAY | Freq: Two times a day (BID) | RESPIRATORY_TRACT | Status: DC

## 2012-08-31 MED ORDER — AMOXICILLIN-POT CLAVULANATE 875-125 MG PO TABS: 1.0000 | ORAL_TABLET | Freq: Two times a day (BID) | ORAL | Status: DC

## 2012-08-31 MED ORDER — ATORVASTATIN CALCIUM 20 MG PO TABS: 20.0000 mg | ORAL_TABLET | Freq: Every day | ORAL | Status: DC

## 2012-08-31 NOTE — Progress Notes (Signed)
Subjective:    Patient ID: Douglas Christensen, male    DOB: 1951/04/06, 61 y.o.   MRN: [id number]  HPI  The patient is here for a wellness exam. The patient has been doing well overall without major physical or psychological issues going on lately, except for a cold sx's.  The patient presents for a follow-up of  chronic hypertension, chronic dyslipidemia, CAD controlled with medicines.   F/u LBP, moles  BP Readings from Last 3 Encounters:  08/31/12 120/74  08/22/12 118/70  06/20/12 145/51    Wt Readings from Last 3 Encounters:  08/31/12 164 lb (74.39 kg)  08/22/12 163 lb 2 oz (73.993 kg)  06/20/12 164 lb 4 oz (74.503 kg)      Review of Systems  Constitutional: Positive for fatigue. Negative for appetite change and unexpected weight change.  HENT: Negative for nosebleeds, congestion, sore throat, sneezing, trouble swallowing and neck pain.   Eyes: Negative for itching and visual disturbance.  Respiratory: Positive for chest tightness and shortness of breath. Negative for cough.   Cardiovascular: Positive for chest pain. Negative for palpitations and leg swelling.  Gastrointestinal: Negative for nausea, diarrhea, blood in stool and abdominal distention.  Genitourinary: Negative for frequency and hematuria.  Musculoskeletal: Positive for back pain, arthralgias and gait problem. Negative for joint swelling.  Skin: Negative for color change and rash.  Neurological: Negative for dizziness, tremors, speech difficulty and weakness.  Psychiatric/Behavioral: Negative for suicidal ideas, confusion, disturbed wake/sleep cycle, dysphoric mood and agitation. The patient is not nervous/anxious.        Objective:   Physical Exam  Constitutional: He is oriented to person, place, and time. He appears well-developed. No distress.  HENT:  Mouth/Throat: Oropharynx is clear and moist.  Eyes: Conjunctivae normal are normal. Pupils are equal, round, and reactive to light.  Neck: Normal range of  motion. No JVD present. No thyromegaly present.  Cardiovascular: Normal rate, regular rhythm, normal heart sounds and intact distal pulses.  Exam reveals no gallop and no friction rub.   No murmur heard. Pulmonary/Chest: Effort normal and breath sounds normal. No respiratory distress. He has no wheezes. He has no rales. He exhibits no tenderness.  Abdominal: Soft. Bowel sounds are normal. He exhibits no distension and no mass. There is no tenderness. There is no rebound and no guarding.  Musculoskeletal: Normal range of motion. He exhibits no edema and no tenderness.  Lymphadenopathy:    He has no cervical adenopathy.  Neurological: He is alert and oriented to person, place, and time. He has normal reflexes. No cranial nerve deficit. He exhibits normal muscle tone. Coordination normal.  Skin: Skin is warm and dry. No rash noted.       Very tanned  Psychiatric: He has a normal mood and affect. His behavior is normal. Judgment and thought content normal.  Moles   Lab Results  Component Value Date   WBC 6.5 08/28/2012   HGB 18.5 Repeated and verified X2.* 08/28/2012   HCT 54.1* 08/28/2012   PLT 234.0 08/28/2012   GLUCOSE 86 08/28/2012   CHOL 236* 08/28/2012   TRIG 291.0* 08/28/2012   HDL 36.50* 08/28/2012   LDLDIRECT 159.6 08/28/2012   LDLCALC 111* 09/25/2006   ALT 34 08/28/2012   AST 32 08/28/2012   NA 132* 08/28/2012   K 3.6 08/28/2012   CL 94* 08/28/2012   CREATININE 0.9 08/28/2012   BUN 13 08/28/2012   CO2 27 08/28/2012   TSH 1.93 08/28/2012   PSA 0.65 08/28/2012  INR 0.93 11/16/2011   HGBA1C 6.0 05/01/2008          Assessment & Plan:

## 2012-08-31 NOTE — Assessment & Plan Note (Signed)
We discussed age appropriate health related issues, including available/recomended screening tests and vaccinations. We discussed a need for adhering to healthy diet and exercise. Labs/EKG were reviewed/ordered. All questions were answered.   

## 2012-09-02 NOTE — Assessment & Plan Note (Signed)
Continue with current prescription therapy as reflected on the Med list.  

## 2012-09-02 NOTE — Assessment & Plan Note (Signed)
Phlebotomy today.

## 2012-09-02 NOTE — Assessment & Plan Note (Signed)
Re-start Rx 

## 2012-09-02 NOTE — Assessment & Plan Note (Signed)
Labs incl B12 OSA sx's discussed

## 2012-10-08 ENCOUNTER — Other Ambulatory Visit: Payer: Self-pay | Admitting: *Deleted

## 2012-10-08 MED ORDER — ATENOLOL 50 MG PO TABS: 50.0000 mg | ORAL_TABLET | Freq: Every day | ORAL | Status: DC

## 2012-10-16 ENCOUNTER — Encounter: Payer: Self-pay | Admitting: Internal Medicine

## 2012-10-16 ENCOUNTER — Encounter: Payer: Self-pay | Admitting: Gastroenterology

## 2012-10-16 ENCOUNTER — Ambulatory Visit (INDEPENDENT_AMBULATORY_CARE_PROVIDER_SITE_OTHER): Payer: 59 | Admitting: Internal Medicine

## 2012-10-16 VITALS — BP 116/80 | HR 61 | Temp 97.4°F | Wt 166.0 lb

## 2012-10-16 DIAGNOSIS — D45 Polycythemia vera: Secondary | ICD-10-CM

## 2012-10-16 DIAGNOSIS — I739 Peripheral vascular disease, unspecified: Secondary | ICD-10-CM

## 2012-10-16 DIAGNOSIS — M545 Low back pain, unspecified: Secondary | ICD-10-CM

## 2012-10-16 DIAGNOSIS — J4489 Other specified chronic obstructive pulmonary disease: Secondary | ICD-10-CM

## 2012-10-16 DIAGNOSIS — IMO0002 Reserved for concepts with insufficient information to code with codable children: Secondary | ICD-10-CM

## 2012-10-16 DIAGNOSIS — J449 Chronic obstructive pulmonary disease, unspecified: Secondary | ICD-10-CM

## 2012-10-16 DIAGNOSIS — D751 Secondary polycythemia: Secondary | ICD-10-CM

## 2012-10-16 DIAGNOSIS — E538 Deficiency of other specified B group vitamins: Secondary | ICD-10-CM

## 2012-10-16 DIAGNOSIS — M5416 Radiculopathy, lumbar region: Secondary | ICD-10-CM | POA: Insufficient documentation

## 2012-10-16 MED ORDER — FLUTICASONE-SALMETEROL 100-50 MCG/DOSE IN AEPB: 1.0000 | INHALATION_SPRAY | Freq: Two times a day (BID) | RESPIRATORY_TRACT | Status: DC

## 2012-10-16 MED ORDER — TRAMADOL HCL 50 MG PO TABS: 50.0000 mg | ORAL_TABLET | Freq: Two times a day (BID) | ORAL | Status: DC | PRN

## 2012-10-16 MED ORDER — PREDNISONE 10 MG PO TABS: ORAL_TABLET | ORAL | Status: DC

## 2012-10-16 NOTE — Assessment & Plan Note (Signed)
Chronic, severe 2013 - trying to quit smoking Advair bid

## 2012-10-16 NOTE — Assessment & Plan Note (Signed)
Continue with current prescription therapy as reflected on the Med list.  

## 2012-10-16 NOTE — Assessment & Plan Note (Signed)
Chronic  2012 - s/p NS eval; he had a CT myelo

## 2012-10-16 NOTE — Assessment & Plan Note (Signed)
11/13 CT myelo 3/13: MYELOGRAM INJECTION  Technique: Informed consent was obtained from the patient prior to  the procedure, including potential complications of headache,  allergy, infection and pain. Specific instructions were given  regarding 24 hour bedrest post procedure to prevent post-LP  headache. A timeout procedure was performed. With the patient  prone, the lower back was prepped with Betadine. 1% Lidocaine was  used for local anesthesia. Lumbar puncture was performed by the  radiologist at the L5-S1 level using a 22 gauge needle with return  of clear CSF. 15 cc of Omnipaque 180 was injected into the  subarachnoid space .  IMPRESSION:  Successful injection of intrathecal contrast for myelography.  MYELOGRAM LUMBAR  Clinical Data: Low back pain. Left leg pain.  Comparison: None.  Findings: Good opacification lumbar subarachnoid space. Mild  effacement of both L5 nerve roots is seen with the patient prone  for myelography. There is no spinal stenosis observed. With the  patient standing, the effacement of both L5 nerve roots slightly  increases, more so on the left. 1 mm of facet mediated slip at L4-  L5 noted with the patient prone increases to 2 mm in the upright  neutral and extension views. 3 mm anterolisthesis L4 on L5 is  noted in flexion. There is advanced vascular calcification.  Fluoroscopy Time: 1.26 minutes  IMPRESSION:  As above.  CT MYELOGRAPHY LUMBAR SPINE  Technique: Multidetector CT imaging of the lumbar spine was  performed following myelography. Multiplanar CT image  reconstructions were also generated.  Findings: No prevertebral or paraspinous masses. Normal conus.  Nonaneurysmal, but fairly extensive, atherosclerotic calcification  of the aorta.  L1-2: Normal.  L2-3: Normal.  L3-4: Mild facet arthropathy. No stenosis or disc protrusion.  L4-5: Moderate facet arthropathy. Minimal 1 mm anterolisthesis  without pars defects. Mild annular bulging is  slightly greater on  the right, but does not affect the L4 or L5 nerve roots.  L5-S1: Mild annular bulging. No stenosis or disc protrusion.  The CT findings are discordant with myelography. There is  asymmetric left greater than right L5 nerve root encroachment with  the patient standing as seen on the AP erect film. There is no  significant compressive lesion identified with the patient  recumbent for CT.  IMPRESSION:  Approximately 2 mm of facet mediated slip at L4-5 with the patient  upright is associated with relatively mild myelographic defects  which are unimpressive at CT. Mild annular bulging at L4-5, but no  clear-cut lateralizing focal protrusion.  Fairly advanced atheromatous calcification of the aorta is  nonaneurysmal.  Original Report Authenticated By: Staci Righter, M.D.  Prednisone 10 mg: take 4 tabs a day x 3 days; then 3 tabs a day x 4 days; then 2 tabs a day x 4 days, then 1 tab a day x 6 days, then stop. Take pc.

## 2012-10-16 NOTE — Assessment & Plan Note (Addendum)
Continue with current prescription therapy as reflected on the Med list.  Chronic  Dr Burt Knack STENTs in B LEs

## 2012-10-16 NOTE — Assessment & Plan Note (Signed)
Better  

## 2012-10-16 NOTE — Progress Notes (Signed)
Patient ID: Douglas Christensen, male   DOB: May 30, 1951, 61 y.o.   MRN: [id number]  Subjective:    Patient ID: Douglas Christensen, male    DOB: 1951-11-07, 61 y.o.   MRN: [id number]  Leg Pain  Incident location: no accident. There was no injury mechanism. The pain is present in the right ankle, right heel, right foot, right thigh, right knee and right hip. The quality of the pain is described as shooting. The pain is at a severity of 8/10. The pain is severe. The pain has been fluctuating since onset. Associated symptoms include numbness. He has tried acetaminophen and NSAIDs for the symptoms. The treatment provided mild relief.   He saw Dr Annette Stable and had a CT myelogram a few months ago   The patient presents for a follow-up of  chronic hypertension, chronic dyslipidemia, CAD controlled with medicines.   F/u LBP, moles  BP Readings from Last 3 Encounters:  10/16/12 116/80  08/31/12 120/74  08/22/12 118/70    Wt Readings from Last 3 Encounters:  10/16/12 166 lb (75.297 kg)  08/31/12 164 lb (74.39 kg)  08/22/12 163 lb 2 oz (73.993 kg)      Review of Systems  Constitutional: Positive for fatigue. Negative for appetite change and unexpected weight change.  HENT: Negative for nosebleeds, congestion, sore throat, sneezing, trouble swallowing and neck pain.   Eyes: Negative for itching and visual disturbance.  Respiratory: Positive for shortness of breath. Negative for cough and chest tightness.   Cardiovascular: Positive for chest pain. Negative for palpitations and leg swelling.  Gastrointestinal: Negative for nausea, diarrhea, blood in stool and abdominal distention.  Genitourinary: Negative for frequency and hematuria.  Musculoskeletal: Positive for back pain, arthralgias and gait problem. Negative for joint swelling.  Skin: Negative for color change and rash.  Neurological: Positive for numbness. Negative for dizziness, tremors, speech difficulty and weakness.  Psychiatric/Behavioral: Negative for  suicidal ideas, confusion, sleep disturbance, dysphoric mood and agitation. The patient is not nervous/anxious.        Objective:   Physical Exam  Constitutional: He is oriented to person, place, and time. He appears well-developed. No distress.  HENT:  Mouth/Throat: Oropharynx is clear and moist.  Eyes: Conjunctivae normal are normal. Pupils are equal, round, and reactive to light.  Neck: Normal range of motion. No JVD present. No thyromegaly present.  Cardiovascular: Normal rate, regular rhythm, normal heart sounds and intact distal pulses.  Exam reveals no gallop and no friction rub.   No murmur heard. Pulmonary/Chest: Effort normal and breath sounds normal. No respiratory distress. He has no wheezes. He has no rales. He exhibits no tenderness.  Abdominal: Soft. Bowel sounds are normal. He exhibits no distension and no mass. There is no tenderness. There is no rebound and no guarding.  Musculoskeletal: Normal range of motion. He exhibits tenderness (R buttock, R hip, R leg, foot). He exhibits no edema.  Lymphadenopathy:    He has no cervical adenopathy.  Neurological: He is alert and oriented to person, place, and time. He has normal reflexes. No cranial nerve deficit. He exhibits normal muscle tone. Coordination normal.  Skin: Skin is warm and dry. No rash noted.       Very tanned  Psychiatric: He has a normal mood and affect. His behavior is normal. Judgment and thought content normal.  Moles   Lab Results  Component Value Date   WBC 6.5 08/28/2012   HGB 18.5 Repeated and verified X2.* 08/28/2012   HCT 54.1* 08/28/2012  PLT 234.0 08/28/2012   GLUCOSE 86 08/28/2012   CHOL 236* 08/28/2012   TRIG 291.0* 08/28/2012   HDL 36.50* 08/28/2012   LDLDIRECT 159.6 08/28/2012   LDLCALC 111* 09/25/2006   ALT 34 08/28/2012   AST 32 08/28/2012   NA 132* 08/28/2012   K 3.6 08/28/2012   CL 94* 08/28/2012   CREATININE 0.9 08/28/2012   BUN 13 08/28/2012   CO2 27 08/28/2012   TSH 1.93 08/28/2012   PSA  0.65 08/28/2012   INR 0.93 11/16/2011   HGBA1C 6.0 05/01/2008          Assessment & Plan:

## 2012-10-16 NOTE — Assessment & Plan Note (Signed)
CBC

## 2012-10-30 ENCOUNTER — Other Ambulatory Visit: Payer: Self-pay | Admitting: *Deleted

## 2012-10-30 MED ORDER — SILDENAFIL CITRATE 100 MG PO TABS: 100.0000 mg | ORAL_TABLET | Freq: Every day | ORAL | Status: DC | PRN

## 2012-11-02 ENCOUNTER — Other Ambulatory Visit (INDEPENDENT_AMBULATORY_CARE_PROVIDER_SITE_OTHER): Payer: 59

## 2012-11-02 ENCOUNTER — Encounter: Payer: Self-pay | Admitting: Internal Medicine

## 2012-11-02 ENCOUNTER — Ambulatory Visit (INDEPENDENT_AMBULATORY_CARE_PROVIDER_SITE_OTHER): Payer: 59 | Admitting: Internal Medicine

## 2012-11-02 VITALS — BP 132/60 | HR 80 | Temp 98.2°F | Resp 16 | Wt 171.0 lb

## 2012-11-02 DIAGNOSIS — J4489 Other specified chronic obstructive pulmonary disease: Secondary | ICD-10-CM

## 2012-11-02 DIAGNOSIS — J449 Chronic obstructive pulmonary disease, unspecified: Secondary | ICD-10-CM

## 2012-11-02 DIAGNOSIS — D751 Secondary polycythemia: Secondary | ICD-10-CM

## 2012-11-02 DIAGNOSIS — D45 Polycythemia vera: Secondary | ICD-10-CM

## 2012-11-02 DIAGNOSIS — I70219 Atherosclerosis of native arteries of extremities with intermittent claudication, unspecified extremity: Secondary | ICD-10-CM

## 2012-11-02 DIAGNOSIS — F17200 Nicotine dependence, unspecified, uncomplicated: Secondary | ICD-10-CM

## 2012-11-02 DIAGNOSIS — R0609 Other forms of dyspnea: Secondary | ICD-10-CM

## 2012-11-02 DIAGNOSIS — R0989 Other specified symptoms and signs involving the circulatory and respiratory systems: Secondary | ICD-10-CM

## 2012-11-02 DIAGNOSIS — M545 Low back pain, unspecified: Secondary | ICD-10-CM

## 2012-11-02 DIAGNOSIS — R0683 Snoring: Secondary | ICD-10-CM

## 2012-11-02 DIAGNOSIS — E538 Deficiency of other specified B group vitamins: Secondary | ICD-10-CM

## 2012-11-02 LAB — HEMOGLOBIN AND HEMATOCRIT, BLOOD
HCT: 41.8 % (ref 39.0–52.0)
Hemoglobin: 14.2 g/dL (ref 13.0–17.0)

## 2012-11-02 MED ORDER — SILDENAFIL CITRATE 100 MG PO TABS: 100.0000 mg | ORAL_TABLET | Freq: Every day | ORAL | Status: DC | PRN

## 2012-11-02 NOTE — Assessment & Plan Note (Signed)
Continue with current prescription therapy as reflected on the Med list.  

## 2012-11-02 NOTE — Assessment & Plan Note (Signed)
He is trying to stop

## 2012-11-02 NOTE — Assessment & Plan Note (Signed)
Worse R>L. He is on a "study blood thinner" He will need to see Dr Burt Knack - his sx's sound vascular in nature. Dr Annette Stable and Arnoldo Morale said no need for surgery

## 2012-11-02 NOTE — Progress Notes (Signed)
Subjective:    Patient ID: Douglas Christensen, male    DOB: 01/29/1951, 61 y.o.   MRN: [id number]  Leg Pain  Incident location: no accident. There was no injury mechanism. The pain is present in the right ankle, right heel, right foot, right thigh, right knee and right hip. The quality of the pain is described as shooting. The pain is at a severity of 8/10. The pain is severe. The pain has been fluctuating since onset. Associated symptoms include numbness. He has tried acetaminophen and NSAIDs for the symptoms. The treatment provided mild relief.  C/o bad leg cramps when walking R>L -n calves and hips - he has to stop after 30-40 ft He saw Dr Annette Stable and had a CT myelogram a few months ago - no need for surgery   The patient presents for a follow-up of  chronic hypertension, chronic dyslipidemia, CAD controlled with medicines. He is still smoking...   F/u LBP, moles  BP Readings from Last 3 Encounters:  11/02/12 132/60  10/16/12 116/80  08/31/12 120/74    Wt Readings from Last 3 Encounters:  11/02/12 171 lb (77.565 kg)  10/16/12 166 lb (75.297 kg)  08/31/12 164 lb (74.39 kg)      Review of Systems  Constitutional: Positive for fatigue. Negative for appetite change and unexpected weight change.  HENT: Negative for nosebleeds, congestion, sore throat, sneezing, trouble swallowing and neck pain.   Eyes: Negative for itching and visual disturbance.  Respiratory: Positive for shortness of breath. Negative for cough and chest tightness.   Cardiovascular: Positive for chest pain. Negative for palpitations and leg swelling.  Gastrointestinal: Negative for nausea, diarrhea, blood in stool and abdominal distention.  Genitourinary: Negative for frequency and hematuria.  Musculoskeletal: Positive for back pain, arthralgias and gait problem. Negative for joint swelling.  Skin: Negative for color change and rash.  Neurological: Positive for numbness. Negative for dizziness, tremors, speech difficulty  and weakness.  Psychiatric/Behavioral: Negative for suicidal ideas, confusion, sleep disturbance, dysphoric mood and agitation. The patient is not nervous/anxious.        Objective:   Physical Exam  Constitutional: He is oriented to person, place, and time. He appears well-developed. No distress.  HENT:  Mouth/Throat: Oropharynx is clear and moist.  Eyes: Conjunctivae normal are normal. Pupils are equal, round, and reactive to light.  Neck: Normal range of motion. No JVD present. No thyromegaly present.  Cardiovascular: Normal rate, regular rhythm, normal heart sounds and intact distal pulses.  Exam reveals no gallop and no friction rub.   No murmur heard. Pulmonary/Chest: Effort normal and breath sounds normal. No respiratory distress. He has no wheezes. He has no rales. He exhibits no tenderness.  Abdominal: Soft. Bowel sounds are normal. He exhibits no distension and no mass. There is no tenderness. There is no rebound and no guarding.  Musculoskeletal: Normal range of motion. He exhibits tenderness (R buttock, R hip, R leg, foot). He exhibits no edema.  Lymphadenopathy:    He has no cervical adenopathy.  Neurological: He is alert and oriented to person, place, and time. He has normal reflexes. No cranial nerve deficit. He exhibits normal muscle tone. Coordination normal.  Skin: Skin is warm and dry. No rash noted.       Very tanned  Psychiatric: He has a normal mood and affect. His behavior is normal. Judgment and thought content normal.  Moles   Lab Results  Component Value Date   WBC 6.5 08/28/2012   HGB 18.5 Repeated and  verified X2.* 08/28/2012   HCT 54.1* 08/28/2012   PLT 234.0 08/28/2012   GLUCOSE 86 08/28/2012   CHOL 236* 08/28/2012   TRIG 291.0* 08/28/2012   HDL 36.50* 08/28/2012   LDLDIRECT 159.6 08/28/2012   LDLCALC 111* 09/25/2006   ALT 34 08/28/2012   AST 32 08/28/2012   NA 132* 08/28/2012   K 3.6 08/28/2012   CL 94* 08/28/2012   CREATININE 0.9 08/28/2012   BUN 13  08/28/2012   CO2 27 08/28/2012   TSH 1.93 08/28/2012   PSA 0.65 08/28/2012   INR 0.93 11/16/2011   HGBA1C 6.0 05/01/2008          Assessment & Plan:

## 2012-11-07 ENCOUNTER — Encounter (HOSPITAL_COMMUNITY): Payer: Self-pay | Admitting: Respiratory Therapy

## 2012-11-07 ENCOUNTER — Encounter: Payer: Self-pay | Admitting: Internal Medicine

## 2012-11-07 ENCOUNTER — Encounter: Payer: Self-pay | Admitting: Cardiovascular Disease

## 2012-11-07 ENCOUNTER — Ambulatory Visit (INDEPENDENT_AMBULATORY_CARE_PROVIDER_SITE_OTHER): Payer: 59 | Admitting: Cardiovascular Disease

## 2012-11-07 VITALS — BP 120/60 | HR 62 | Ht 65.0 in | Wt 168.6 lb

## 2012-11-07 DIAGNOSIS — I70219 Atherosclerosis of native arteries of extremities with intermittent claudication, unspecified extremity: Secondary | ICD-10-CM

## 2012-11-07 DIAGNOSIS — I2510 Atherosclerotic heart disease of native coronary artery without angina pectoris: Secondary | ICD-10-CM

## 2012-11-07 LAB — CBC WITH DIFFERENTIAL/PLATELET
Basophils Absolute: 0 10*3/uL (ref 0.0–0.1)
Basophils Relative: 0.6 % (ref 0.0–3.0)
Eosinophils Absolute: 0.2 10*3/uL (ref 0.0–0.7)
Lymphocytes Relative: 31.2 % (ref 12.0–46.0)
MCHC: 34.8 g/dL (ref 30.0–36.0)
Monocytes Absolute: 0.5 10*3/uL (ref 0.1–1.0)
Neutrophils Relative %: 59.4 % (ref 43.0–77.0)
Platelets: 284 10*3/uL (ref 150.0–400.0)
RBC: 3.91 Mil/uL — ABNORMAL LOW (ref 4.22–5.81)

## 2012-11-07 LAB — BASIC METABOLIC PANEL
BUN: 22 mg/dL (ref 6–23)
CO2: 25 mEq/L (ref 19–32)
Calcium: 9.1 mg/dL (ref 8.4–10.5)
Creatinine, Ser: 1 mg/dL (ref 0.4–1.5)

## 2012-11-07 LAB — PROTIME-INR
INR: 1.1 ratio — ABNORMAL HIGH (ref 0.8–1.0)
Prothrombin Time: 11.2 s (ref 10.2–12.4)

## 2012-11-07 NOTE — Progress Notes (Signed)
HPI:  61 year old gentleman presenting for followup evaluation. He has lower extremity peripheral arterial disease. In 2008 he underwent right SFA stenting and left common iliac stenting. He presented last year with severe intermittent claudication of the left leg and underwent left external iliac stenting for critical stenosis. He was noted to have moderate in-stent restenosis in the right SFA and was managed medically.  Over the past 2 months, he reports progressive and severe intermittent claudication of the right calf. He also has left calf pain with walking but this is not as bad as the right side. Symptoms resolved after about 5 minutes but he has some residual soreness for a long time. His feet get known, especially on the right with walking. He has not had ulceration. He is still trying to quit smoking. He denies chest pain or shortness of breath.  Last ABIs in July 2013 were 0.64 on the right and greater than 0.9 on the left.  Outpatient Encounter Prescriptions as of 11/07/2012  Medication Sig Dispense Refill  . atenolol (TENORMIN) 50 MG tablet Take 1 tablet (50 mg total) by mouth daily.  90 tablet  1  . atorvastatin (LIPITOR) 20 MG tablet Take 1 tablet (20 mg total) by mouth daily.  90 tablet  3  . cholecalciferol (VITAMIN D) 1000 UNITS tablet Take 1,000 Units by mouth daily.        . Cyanocobalamin (VITAMIN B-12) 1000 MCG SUBL Place 1 tablet (1,000 mcg total) under the tongue daily.  100 tablet  3  . Fluticasone-Salmeterol (ADVAIR DISKUS) 100-50 MCG/DOSE AEPB Inhale 1 puff into the lungs 2 (two) times daily.  1 each  5  . losartan-hydrochlorothiazide (HYZAAR) 100-25 MG per tablet Take 1 tablet by mouth daily.  90 tablet  3  . sildenafil (VIAGRA) 100 MG tablet Take 1 tablet (100 mg total) by mouth daily as needed for erectile dysfunction.  12 tablet  5  . traMADol (ULTRAM) 50 MG tablet Take 1-2 tablets (50-100 mg total) by mouth 2 (two) times daily as needed for pain.  60 tablet  3  .  AMBULATORY NON FORMULARY MEDICATION EUCLID RESEARCH STUDY. Plavix vs Brilinta.      . promethazine (PHENERGAN) 25 MG tablet Take 1 tablet (25 mg total) by mouth every 6 (six) hours as needed for nausea.  30 tablet  0  . ranitidine (ZANTAC) 150 MG tablet Take 150 mg by mouth daily as needed. For heartburn         Allergies  Allergen Reactions  . Benazepril     cough    Past Medical History  Diagnosis Date  . History of colonic polyps   . COPD (chronic obstructive pulmonary disease)   . CAD (coronary artery disease)     Mild plaque (cath "years ago")  . GERD (gastroesophageal reflux disease)   . Hyperlipidemia   . Hypertension   . Vitamin D deficiency   . Meralgia paresthetica of left side 2011  . LBP (low back pain)   . PVD (peripheral vascular disease)     Stent to left common femoral and right superficial femoral.  2008.  50%  left renal     ROS: Negative except as per HPI  BP 120/60  Pulse 62  Ht 5\' 5"  (1.651 m)  Wt 76.476 kg (168 lb 9.6 oz)  BMI 28.06 kg/m2  SpO2 98%  PHYSICAL EXAM: Pt is alert and oriented, NAD HEENT: normal Neck: JVP - normal, carotids 2+= without bruits Lungs: CTA bilaterally CV:  RRR without murmur or gallop Abd: soft, NT, Positive BS, no hepatomegaly Ext: no C/C/E, femoral pulses are 1+ and equal with a left femoral bruit. There is a quiet right femoral bruit. Pedal pulses are nonpalpable bilaterally  Skin: warm/dry no rash  EKG:  Sinus rhythm with first-degree AV block 62 beats per minute, cannot rule out age indeterminate inferior infarct  ASSESSMENT AND PLAN: 1. lower extremity peripheral arterial disease with intermittent claudication. The patient has progressive and now severe right leg claudication. There's been a clear change in his symptoms over the last few months and he has known SFA disease on the right. I have recommended angiography with an eye toward PTA and stenting. I reviewed the risks, indications, and alternatives with the  patient and he understands and agrees to proceed. Will plan left femoral artery access for focus on the right leg. He also has left-sided symptoms and he may have developed progressive disease of the left leg. The patient is in the [place] trial (plavix versus brilinta) and he should continue on study drug.  2. Tobacco abuse. The importance of complete cessation was advised and he understands and he will continue to have recurrent problems with his arterial circulation with continued tobacco.  Sherren Mocha 11/07/2012 12:20 PM

## 2012-11-07 NOTE — Patient Instructions (Signed)
Your physician has requested that you have a peripheral vascular angiogram. This exam is performed at the hospital. During this exam IV contrast is used to look at arterial blood flow. Please review the information sheet given for details.  Your physician recommends that you continue on your current medications as directed. Please refer to the Current Medication list given to you today.  Your physician recommends that you schedule a follow-up appointment in: 1 MONTH with Dr Burt Knack

## 2012-11-08 ENCOUNTER — Encounter: Payer: Self-pay | Admitting: Cardiovascular Disease

## 2012-11-09 ENCOUNTER — Encounter (HOSPITAL_BASED_OUTPATIENT_CLINIC_OR_DEPARTMENT_OTHER): Payer: 59 | Admitting: Radiology

## 2012-11-11 ENCOUNTER — Other Ambulatory Visit: Payer: Self-pay | Admitting: Cardiovascular Disease

## 2012-11-14 ENCOUNTER — Encounter (HOSPITAL_COMMUNITY)
Admission: RE | Disposition: A | Discharge status: Home / Self Care | Payer: Self-pay | Source: Ambulatory Visit | Attending: Cardiovascular Disease

## 2012-11-14 ENCOUNTER — Ambulatory Visit (HOSPITAL_COMMUNITY)
Admission: RE | Admit: 2012-11-14 | Discharge: 2012-11-14 | Disposition: A | Discharge status: Home / Self Care | Payer: 59 | Source: Ambulatory Visit | Attending: Cardiovascular Disease | Admitting: Cardiovascular Disease

## 2012-11-14 DIAGNOSIS — K219 Gastro-esophageal reflux disease without esophagitis: Secondary | ICD-10-CM | POA: Insufficient documentation

## 2012-11-14 DIAGNOSIS — I70219 Atherosclerosis of native arteries of extremities with intermittent claudication, unspecified extremity: Secondary | ICD-10-CM | POA: Insufficient documentation

## 2012-11-14 DIAGNOSIS — F17200 Nicotine dependence, unspecified, uncomplicated: Secondary | ICD-10-CM | POA: Insufficient documentation

## 2012-11-14 DIAGNOSIS — E785 Hyperlipidemia, unspecified: Secondary | ICD-10-CM | POA: Insufficient documentation

## 2012-11-14 DIAGNOSIS — J449 Chronic obstructive pulmonary disease, unspecified: Secondary | ICD-10-CM | POA: Insufficient documentation

## 2012-11-14 DIAGNOSIS — I10 Essential (primary) hypertension: Secondary | ICD-10-CM | POA: Insufficient documentation

## 2012-11-14 DIAGNOSIS — Z7902 Long term (current) use of antithrombotics/antiplatelets: Secondary | ICD-10-CM | POA: Insufficient documentation

## 2012-11-14 DIAGNOSIS — J4489 Other specified chronic obstructive pulmonary disease: Secondary | ICD-10-CM | POA: Insufficient documentation

## 2012-11-14 DIAGNOSIS — Z79899 Other long term (current) drug therapy: Secondary | ICD-10-CM | POA: Insufficient documentation

## 2012-11-14 HISTORY — PX: ABDOMINAL AORTAGRAM: SHX5454

## 2012-11-14 SURGERY — ABDOMINAL AORTAGRAM
Anesthesia: LOCAL

## 2012-11-14 MED ORDER — ONDANSETRON HCL 4 MG/2ML IJ SOLN: 4.0000 mg | Freq: Four times a day (QID) | INTRAMUSCULAR | Status: DC | PRN

## 2012-11-14 MED ORDER — ONDANSETRON HCL 4 MG/2ML IJ SOLN
INTRAMUSCULAR | Status: AC
  Filled 2012-11-14: qty 2

## 2012-11-14 MED ORDER — DIAZEPAM 5 MG PO TABS
5.0000 mg | ORAL_TABLET | ORAL | Status: AC
  Administered 2012-11-14: 5 mg via ORAL
  Filled 2012-11-14: qty 1

## 2012-11-14 MED ORDER — ACETAMINOPHEN 325 MG PO TABS: 650.0000 mg | ORAL_TABLET | ORAL | Status: DC | PRN

## 2012-11-14 MED ORDER — MIDAZOLAM HCL 2 MG/2ML IJ SOLN
INTRAMUSCULAR | Status: AC
  Filled 2012-11-14: qty 2

## 2012-11-14 MED ORDER — SODIUM CHLORIDE 0.9 % IJ SOLN: 3.0000 mL | Freq: Two times a day (BID) | INTRAMUSCULAR | Status: DC

## 2012-11-14 MED ORDER — SODIUM CHLORIDE 0.9 % IV SOLN: 250.0000 mL | INTRAVENOUS | Status: DC | PRN

## 2012-11-14 MED ORDER — SODIUM CHLORIDE 0.9 % IV SOLN: 1.0000 mL/kg/h | INTRAVENOUS | Status: DC

## 2012-11-14 MED ORDER — FENTANYL CITRATE 0.05 MG/ML IJ SOLN
INTRAMUSCULAR | Status: AC
  Filled 2012-11-14: qty 2

## 2012-11-14 MED ORDER — ASPIRIN 81 MG PO CHEW
81.0000 mg | CHEWABLE_TABLET | ORAL | Status: AC
  Administered 2012-11-14: 81 mg via ORAL
  Filled 2012-11-14: qty 1

## 2012-11-14 MED ORDER — SODIUM CHLORIDE 0.9 % IJ SOLN: 3.0000 mL | INTRAMUSCULAR | Status: DC | PRN

## 2012-11-14 MED ORDER — SODIUM CHLORIDE 0.9 % IV SOLN
INTRAVENOUS | Status: DC
  Administered 2012-11-14: 1000 mL via INTRAVENOUS

## 2012-11-14 NOTE — H&P (View-Only) (Signed)
HPI:  61 year old gentleman presenting for followup evaluation. He has lower extremity peripheral arterial disease. In 2008 he underwent right SFA stenting and left common iliac stenting. He presented last year with severe intermittent claudication of the left leg and underwent left external iliac stenting for critical stenosis. He was noted to have moderate in-stent restenosis in the right SFA and was managed medically.  Over the past 2 months, he reports progressive and severe intermittent claudication of the right calf. He also has left calf pain with walking but this is not as bad as the right side. Symptoms resolved after about 5 minutes but he has some residual soreness for a long time. His feet get known, especially on the right with walking. He has not had ulceration. He is still trying to quit smoking. He denies chest pain or shortness of breath.  Last ABIs in July 2013 were 0.64 on the right and greater than 0.9 on the left.  Outpatient Encounter Prescriptions as of 11/07/2012  Medication Sig Dispense Refill  . atenolol (TENORMIN) 50 MG tablet Take 1 tablet (50 mg total) by mouth daily.  90 tablet  1  . atorvastatin (LIPITOR) 20 MG tablet Take 1 tablet (20 mg total) by mouth daily.  90 tablet  3  . cholecalciferol (VITAMIN D) 1000 UNITS tablet Take 1,000 Units by mouth daily.        . Cyanocobalamin (VITAMIN B-12) 1000 MCG SUBL Place 1 tablet (1,000 mcg total) under the tongue daily.  100 tablet  3  . Fluticasone-Salmeterol (ADVAIR DISKUS) 100-50 MCG/DOSE AEPB Inhale 1 puff into the lungs 2 (two) times daily.  1 each  5  . losartan-hydrochlorothiazide (HYZAAR) 100-25 MG per tablet Take 1 tablet by mouth daily.  90 tablet  3  . sildenafil (VIAGRA) 100 MG tablet Take 1 tablet (100 mg total) by mouth daily as needed for erectile dysfunction.  12 tablet  5  . traMADol (ULTRAM) 50 MG tablet Take 1-2 tablets (50-100 mg total) by mouth 2 (two) times daily as needed for pain.  60 tablet  3  .  AMBULATORY NON FORMULARY MEDICATION EUCLID RESEARCH STUDY. Plavix vs Brilinta.      . promethazine (PHENERGAN) 25 MG tablet Take 1 tablet (25 mg total) by mouth every 6 (six) hours as needed for nausea.  30 tablet  0  . ranitidine (ZANTAC) 150 MG tablet Take 150 mg by mouth daily as needed. For heartburn         Allergies  Allergen Reactions  . Benazepril     cough    Past Medical History  Diagnosis Date  . History of colonic polyps   . COPD (chronic obstructive pulmonary disease)   . CAD (coronary artery disease)     Mild plaque (cath "years ago")  . GERD (gastroesophageal reflux disease)   . Hyperlipidemia   . Hypertension   . Vitamin D deficiency   . Meralgia paresthetica of left side 2011  . LBP (low back pain)   . PVD (peripheral vascular disease)     Stent to left common femoral and right superficial femoral.  2008.  50%  left renal     ROS: Negative except as per HPI  BP 120/60  Pulse 62  Ht 5\' 5"  (1.651 m)  Wt 76.476 kg (168 lb 9.6 oz)  BMI 28.06 kg/m2  SpO2 98%  PHYSICAL EXAM: Pt is alert and oriented, NAD HEENT: normal Neck: JVP - normal, carotids 2+= without bruits Lungs: CTA bilaterally CV:  RRR without murmur or gallop Abd: soft, NT, Positive BS, no hepatomegaly Ext: no C/C/E, femoral pulses are 1+ and equal with a left femoral bruit. There is a quiet right femoral bruit. Pedal pulses are nonpalpable bilaterally  Skin: warm/dry no rash  EKG:  Sinus rhythm with first-degree AV block 62 beats per minute, cannot rule out age indeterminate inferior infarct  ASSESSMENT AND PLAN: 1. lower extremity peripheral arterial disease with intermittent claudication. The patient has progressive and now severe right leg claudication. There's been a clear change in his symptoms over the last few months and he has known SFA disease on the right. I have recommended angiography with an eye toward PTA and stenting. I reviewed the risks, indications, and alternatives with the  patient and he understands and agrees to proceed. Will plan left femoral artery access for focus on the right leg. He also has left-sided symptoms and he may have developed progressive disease of the left leg. The patient is in the [place] trial (plavix versus brilinta) and he should continue on study drug.  2. Tobacco abuse. The importance of complete cessation was advised and he understands and he will continue to have recurrent problems with his arterial circulation with continued tobacco.  Sherren Mocha 11/07/2012 12:20 PM

## 2012-11-14 NOTE — Progress Notes (Signed)
UP AND WALKED AND TOL WELL;LEFT GROIN WITHOUT BLEEDING OR HEMATOMA

## 2012-11-14 NOTE — CV Procedure (Signed)
   Cardiac Catheterization Procedure Note  Name: Douglas Christensen MRN: [id number] DOB: 1951/11/06  Procedure: Abdominal aortogram, right external iliac angiogram with runoff, left external iliac angiogram with runoff  Indication: Severe intermittent claudication. The patient has bilateral claudication, right greater than left. His symptoms of been progressive over the last few months.  Procedural details: The left groin was prepped, draped, and anesthetized with 1% lidocaine. Using modified Seldinger technique, a 5 French sheath was introduced into the right femoral artery. A pigtail catheter was advanced into the suprarenal abdominal aorta where digital subtraction angiography was performed. A crossover catheter was then used to access the right iliac artery and this was changed out for a straight tip catheter which was advanced into the right external iliac artery. External iliac angiography with runoff to the right foot was performed. The catheter was removed and a left external iliac angiogram with runoff to the left foot was performed through the sheath. Femoral hemostasis was achieved with a Mynx device successfully. There were no immediate procedural complications. The patient was transferred to the post catheterization recovery area for further monitoring.  Procedural findings:  Right leg: The common iliac is patent with calcified nonobstructive disease. The internal and external iliac arteries are patent with calcified nonobstructive disease. The common femoral has severe 95% stenosis. The deep femoral artery is patent. The superficial femoral artery is totally occluded just beyond its origin. It reconstitutes distally via collaterals. There is a long stented segment it is totally occluded. The popliteal artery is patent. There is 2 vessel runoff to the right foot via the anterior tibial and posterior tibial.  Left leg: The common iliac is patent. The stented segments through the left external  iliac are patent. The internal iliac is occluded. The common femoral has severe calcified 90% stenosis just distal to the sheath entry site. The SFA and deep femoral arteries are patent. The popliteal artery is patent. There is three-vessel runoff to the left foot.  Final Conclusions:   1. Severe right common femoral artery stenosis. 2. Total occlusion of the right SFA with collaterals supplied by the deep femoral artery. 3. Severe left common femoral artery stenosis.  Recommendations: Vascular surgery referral for consideration of right femoral-popliteal bypass and left femoral endarterectomy.  Sherren Mocha 11/14/2012, 3:24 PM

## 2012-11-14 NOTE — Interval H&P Note (Signed)
History and Physical Interval Note:  11/14/2012 2:22 PM  Douglas Christensen  has presented today for surgery, with the diagnosis of PVD  The various methods of treatment have been discussed with the patient and family. After consideration of risks, benefits and other options for treatment, the patient has consented to  Procedure(s) (LRB) with comments: ABDOMINAL AORTAGRAM (N/A) as a surgical intervention .  The patient's history has been reviewed, patient examined, no change in status, stable for surgery.  I have reviewed the patient's chart and labs.  Questions were answered to the patient's satisfaction.     Sherren Mocha

## 2012-11-16 ENCOUNTER — Encounter (HOSPITAL_BASED_OUTPATIENT_CLINIC_OR_DEPARTMENT_OTHER): Payer: 59

## 2012-11-22 ENCOUNTER — Ambulatory Visit (HOSPITAL_BASED_OUTPATIENT_CLINIC_OR_DEPARTMENT_OTHER): Payer: 59 | Attending: Internal Medicine | Admitting: Radiology

## 2012-11-22 DIAGNOSIS — G4733 Obstructive sleep apnea (adult) (pediatric): Secondary | ICD-10-CM | POA: Insufficient documentation

## 2012-11-22 DIAGNOSIS — D751 Secondary polycythemia: Secondary | ICD-10-CM

## 2012-11-22 DIAGNOSIS — R0683 Snoring: Secondary | ICD-10-CM

## 2012-11-23 ENCOUNTER — Encounter: Payer: Self-pay | Admitting: *Deleted

## 2012-11-23 ENCOUNTER — Encounter (HOSPITAL_BASED_OUTPATIENT_CLINIC_OR_DEPARTMENT_OTHER): Payer: 59

## 2012-11-23 ENCOUNTER — Ambulatory Visit (AMBULATORY_SURGERY_CENTER): Payer: 59

## 2012-11-23 VITALS — Ht 64.5 in | Wt 167.0 lb

## 2012-11-23 DIAGNOSIS — Z860100 Personal history of colon polyps, unspecified: Secondary | ICD-10-CM

## 2012-11-23 MED ORDER — MOVIPREP 100 G PO SOLR: 1.0000 | Freq: Once | ORAL | Status: DC

## 2012-11-29 ENCOUNTER — Encounter: Payer: Self-pay | Admitting: Surgery

## 2012-11-30 ENCOUNTER — Encounter (INDEPENDENT_AMBULATORY_CARE_PROVIDER_SITE_OTHER): Payer: 59 | Admitting: *Deleted

## 2012-11-30 ENCOUNTER — Encounter: Payer: Self-pay | Admitting: Surgery

## 2012-11-30 ENCOUNTER — Ambulatory Visit (INDEPENDENT_AMBULATORY_CARE_PROVIDER_SITE_OTHER): Payer: 59 | Admitting: Surgery

## 2012-11-30 ENCOUNTER — Other Ambulatory Visit: Payer: Self-pay | Admitting: *Deleted

## 2012-11-30 ENCOUNTER — Other Ambulatory Visit (INDEPENDENT_AMBULATORY_CARE_PROVIDER_SITE_OTHER): Payer: 59 | Admitting: *Deleted

## 2012-11-30 ENCOUNTER — Encounter: Payer: Self-pay | Admitting: *Deleted

## 2012-11-30 VITALS — BP 178/88 | HR 92 | Ht 64.5 in | Wt 178.7 lb

## 2012-11-30 DIAGNOSIS — Z01810 Encounter for preprocedural cardiovascular examination: Secondary | ICD-10-CM

## 2012-11-30 DIAGNOSIS — I70219 Atherosclerosis of native arteries of extremities with intermittent claudication, unspecified extremity: Secondary | ICD-10-CM

## 2012-11-30 DIAGNOSIS — I6529 Occlusion and stenosis of unspecified carotid artery: Secondary | ICD-10-CM

## 2012-11-30 DIAGNOSIS — I739 Peripheral vascular disease, unspecified: Secondary | ICD-10-CM

## 2012-11-30 NOTE — Progress Notes (Signed)
[facility] and [facility] Specialist of [facility]   Patient name: Douglas Christensen MRN: [id number] DOB: [birth year] Sex: male   Referred by: Dr. Burt Knack  Reason for referral:  Chief Complaint  Patient presents with  . New Evaluation    severe claudication - poss. rt fem-pop bp/ Dr. Sherren Mocha-  pt c/o of intermittent pain and claudication in both legs moreso in right    HISTORY OF PRESENT ILLNESS: This is a very pleasant 62 year old gentleman that is referred to me by Dr. Burt Knack for evaluation of surgical treatment of peripheral vascular disease. He has recently undergone angiography which reveals an occluded right superficial femoral artery as well as a high-grade right common femoral stenosis. He has a history of right superficial femoral artery stenting in 2008. These have occluded. Also, one year ago he had stenting of his left iliac system. He currently has a high-grade left common femoral stenosis. The patient states that he can walk approximately 50-100 feet before he gets cramps. His cramps are relieved with rest. He denies ulceration or rest pain. He will occasionally get numbness in both feet.  The patient has a history of hypercholesterolemia. He is currently taking a statin. He has a 45 year history of tobacco abuse. He is medically managed for his hypertension. His blood pressure usually runs in the 120s. The patient is also involved in the Euclid study.  Past Medical History  Diagnosis Date  . History of colonic polyps   . COPD (chronic obstructive pulmonary disease)   . CAD (coronary artery disease)     Mild plaque (cath "years ago")  . GERD (gastroesophageal reflux disease)   . Hyperlipidemia   . Hypertension   . Vitamin D deficiency   . Meralgia paresthetica of left side 2011  . LBP (low back pain)   . PVD (peripheral vascular disease)     Stent to left common femoral and right superficial femoral.  2008.  50%  left renal     Past Surgical History  Procedure Date  . None     . Lower extremity stents     bilateral lower extremities    History   Social History  . Marital Status: Widowed    Spouse Name: N/A    Number of Children: N/A  . Years of Education: N/A   Occupational History  . Not on file.   Social History Main Topics  . Smoking status: Current Every Day Smoker -- 1.0 packs/day for 45 years    Types: Cigarettes  . Smokeless tobacco: Never Used     Comment: pt is going to register for the you can quit classes at [facility] starting on 12/14/2012  . Alcohol Use: 3.6 - 4.2 oz/week    2 Cans of beer, 4-5 Shots of liquor per week  . Drug Use: No  . Sexually Active: Yes   Other Topics Concern  . Not on file   Social History Narrative   Widower    Family History  Problem Relation Age of Onset  . Heart disease Mother     No clear CAD  . Hypertension Mother   . Cancer Mother     ? colon ca  . Hyperlipidemia Mother   . Cancer Father     brain tumor  . Alzheimer's disease Father   . Colon cancer Neg Hx     Allergies as of 11/30/2012 - Review Complete 11/30/2012  Allergen Reaction Noted  . Benazepril  08/31/2012    Current Outpatient Prescriptions on  File Prior to Visit  Medication Sig Dispense Refill  . AMBULATORY NON FORMULARY MEDICATION EUCLID RESEARCH STUDY. Plavix vs Brilinta.      Marland Kitchen atenolol (TENORMIN) 50 MG tablet Take 1 tablet (50 mg total) by mouth daily.  90 tablet  1  . atorvastatin (LIPITOR) 20 MG tablet Take 1 tablet (20 mg total) by mouth daily.  90 tablet  3  . cholecalciferol (VITAMIN D) 1000 UNITS tablet Take 1,000 Units by mouth daily.        . Fluticasone-Salmeterol (ADVAIR DISKUS) 100-50 MCG/DOSE AEPB Inhale 1 puff into the lungs 2 (two) times daily.  1 each  5  . MOVIPREP 100 G SOLR Take 1 kit (100 g total) by mouth once.  1 kit  0  . ranitidine (ZANTAC) 150 MG tablet Take 150 mg by mouth daily as needed. For heartburn       . sildenafil (VIAGRA) 100 MG tablet Take 1 tablet (100 mg total) by mouth daily as needed for  erectile dysfunction.  12 tablet  5  . traMADol (ULTRAM) 50 MG tablet Take 1-2 tablets (50-100 mg total) by mouth 2 (two) times daily as needed for pain.  60 tablet  3  . vitamin B-12 (CYANOCOBALAMIN) 1000 MCG tablet Take 1,000 mcg by mouth daily.      Marland Kitchen losartan-hydrochlorothiazide (HYZAAR) 100-25 MG per tablet Take 1 tablet by mouth daily.  90 tablet  3     REVIEW OF SYSTEMS: Cardiovascular: No chest pain, chest pressure, palpitations, orthopnea, or dyspnea on exertion. Positive for pain in legs with walking, right greater than left. No history of DVT or phlebitis. Pulmonary: No productive cough, asthma or wheezing. Neurologic: No weakness, paresthesias, aphasia, or amaurosis. No dizziness. Hematologic: No bleeding problems or clotting disorders. Musculoskeletal: No joint pain or joint swelling. Gastrointestinal: No blood in stool or hematemesis Genitourinary: No dysuria or hematuria. Psychiatric:: No history of major depression. Integumentary: No rashes or ulcers. Constitutional: No fever or chills.  PHYSICAL EXAMINATION: General: The patient appears their stated age.  Vital signs are BP 178/88  Pulse 92  Ht 5' 4.5" (1.638 m)  Wt 178 lb 11.2 oz (81.058 kg)  BMI 30.20 kg/m2  SpO2 99% HEENT:  No gross abnormalities Pulmonary: Respirations are non-labored Abdomen: Soft and non-tender  Musculoskeletal: There are no major deformities.   Neurologic: No focal weakness or paresthesias are detected, Skin: There are no ulcer or rashes noted. Psychiatric: The patient has normal affect. Cardiovascular: There is a regular rate and rhythm without significant murmur appreciated. Faintly palpable femoral pulse on the right. Pedal pulses are not palpable. No carotid bruits.  Diagnostic Studies: Carotid duplex was ordered and reviewed today: This shows 1-39% bilateral stenosis. Lower extremity vein mapping was ordered today. This shows an excellent saphenous vein bilaterally.  Outside  Studies/Documentation Historical records were reviewed.  They showed occluded right superficial femoral artery. High-grade bilateral common femoral artery stenosis  Medication Changes: The patient will need to be off of the Euclid study protocol for 5 days prior to his operation. While he is off this medicine he will take 81 mg aspirin  Assessment:  Bilateral claudication, right greater than left  Plan: I had an extensive discussion with the patient, outlining the extent of his disease. I have strongly encouraged him to stop smoking as this will have an impact on the patency of his bypass graft. Based on his vein mapping, he is a candidate for a femoral to above-knee popliteal artery bypass graft with vein.  We discussed the risks and benefits of the operation as well as the hospital stay. He understands the need for future surveillance to prevent graft failure. He would like to get this done as soon as possible. I have scheduled his operation for Tuesday, January 14. We will address his left leg based on the significance of his symptoms once he has recovered from his right leg surgery.      Eldridge Abrahams, M.D. [facility] [place] Office: [phone] Pager:  [phone]

## 2012-12-03 ENCOUNTER — Encounter (HOSPITAL_COMMUNITY): Payer: Self-pay | Admitting: Pharmacy Technician

## 2012-12-03 ENCOUNTER — Telehealth: Payer: Self-pay | Admitting: Gastroenterology

## 2012-12-03 NOTE — Telephone Encounter (Signed)
Diet instructions reviewed with pt.

## 2012-12-05 ENCOUNTER — Encounter: Payer: Self-pay | Admitting: Gastroenterology

## 2012-12-05 ENCOUNTER — Ambulatory Visit (AMBULATORY_SURGERY_CENTER): Payer: 59 | Admitting: Gastroenterology

## 2012-12-05 VITALS — BP 145/78 | HR 60 | Temp 98.3°F | Resp 17 | Ht 64.0 in | Wt 167.0 lb

## 2012-12-05 DIAGNOSIS — Z86010 Personal history of colonic polyps: Secondary | ICD-10-CM

## 2012-12-05 DIAGNOSIS — D126 Benign neoplasm of colon, unspecified: Secondary | ICD-10-CM

## 2012-12-05 DIAGNOSIS — Z1211 Encounter for screening for malignant neoplasm of colon: Secondary | ICD-10-CM

## 2012-12-05 MED ORDER — SODIUM CHLORIDE 0.9 % IV SOLN: 500.0000 mL | INTRAVENOUS | Status: DC

## 2012-12-05 NOTE — Progress Notes (Signed)
Patient did not experience any of the following events: a burn prior to discharge; a fall within the facility; wrong site/side/patient/procedure/implant event; or a hospital transfer or hospital admission upon discharge from the facility. (G8907) Patient did not have preoperative order for IV antibiotic SSI prophylaxis. (G8918)  

## 2012-12-05 NOTE — Op Note (Signed)
[place]  [address] [place], [address]   COLONOSCOPY PROCEDURE REPORT  PATIENT: Douglas Christensen, Douglas Christensen  MR#: [id number] BIRTHDATE: March 06, 1951 , 61  yrs. old GENDER: Male ENDOSCOPIST: Ladene Artist, MD, [facility] PROCEDURE DATE:  12/05/2012 PROCEDURE:   Colonoscopy with snare polypectomy ASA CLASS:   Class II INDICATIONS:Patient's personal history of adenomatous colon polyps: 2001, 2008. MEDICATIONS: MAC sedation, administered by CRNA and propofol (Diprivan) 200mg  IV DESCRIPTION OF PROCEDURE:   After the risks benefits and alternatives of the procedure were thoroughly explained, informed consent was obtained.  A digital rectal exam revealed no abnormalities of the rectum.   The LB [id number]  endoscope was introduced through the anus and advanced to the cecum, which was identified by both the appendix and ileocecal valve. No adverse events experienced.   The quality of the prep was excellent, using MoviPrep  The instrument was then slowly withdrawn as the colon was fully examined.  COLON FINDINGS: Two sessile polyps measuring 5-6 mm in size were found in the transverse colon.  A polypectomy was performed with a cold snare.  The resection was complete and the polyp tissue was completely retrieved.   A sessile polyp measuring 5 mm in size was found in the rectum.  A polypectomy was performed with a cold snare.  The resection was complete and the polyp tissue was completely retrieved.  Moderate diverticulosis in the sigmoid colon. The colon was otherwise normal.  There was no diverticulosis, inflammation, polyps or cancers unless previously stated.  Retroflexed views revealed small internal hemorrhoids. The time to cecum=1 minutes 46 seconds.  Withdrawal time=10 minutes 22 seconds.  The scope was withdrawn and the procedure completed.  COMPLICATIONS: There were no complications.  ENDOSCOPIC IMPRESSION: 1.   Two sessile polyps measuring 5-6 mm in the transverse  colon; polypectomy performed with a cold snare 2.   Sessile polyp measuring 5 mm in the rectum; polypectomy performed with a cold snare 3.   Moderate sigmoid diverticulosis 4.   Small internal hemorrhoids  RECOMMENDATIONS: 1.  Await pathology results 2.  Repeat Colonoscopy in 5 years.   eSigned:  Ladene Artist, MD, [facility] 12/05/2012 9:29 AM

## 2012-12-05 NOTE — Progress Notes (Signed)
Lidocaine-40mg IV prior to Propofol InductionPropofol given over incremental dosages 

## 2012-12-05 NOTE — Patient Instructions (Addendum)
Impressions/recommendations:  Polyps (handout given) Diverticulosis (handout given) High Fiber Diet (handout given)  Repeat colonoscopy in 5 years.  YOU HAD AN ENDOSCOPIC PROCEDURE TODAY AT [facility]: Refer to the procedure report that was given to you for any specific questions about what was found during the examination.  If the procedure report does not answer your questions, please call your gastroenterologist to clarify.  If you requested that your care partner not be given the details of your procedure findings, then the procedure report has been included in a sealed envelope for you to review at your convenience later.  YOU SHOULD EXPECT: Some feelings of bloating in the abdomen. Passage of more gas than usual.  Walking can help get rid of the air that was put into your GI tract during the procedure and reduce the bloating. If you had a lower endoscopy (such as a colonoscopy or flexible sigmoidoscopy) you may notice spotting of blood in your stool or on the toilet paper. If you underwent a bowel prep for your procedure, then you may not have a normal bowel movement for a few days.  DIET: Your first meal following the procedure should be a light meal and then it is ok to progress to your normal diet.  A half-sandwich or bowl of soup is an example of a good first meal.  Heavy or fried foods are harder to digest and may make you feel nauseous or bloated.  Likewise meals heavy in dairy and vegetables can cause extra gas to form and this can also increase the bloating.  Drink plenty of fluids but you should avoid alcoholic beverages for 24 hours.  ACTIVITY: Your care partner should take you home directly after the procedure.  You should plan to take it easy, moving slowly for the rest of the day.  You can resume normal activity the day after the procedure however you should NOT DRIVE or use heavy machinery for 24 hours (because of the sedation medicines used during the test).     SYMPTOMS TO REPORT IMMEDIATELY: A gastroenterologist can be reached at any hour.  During normal business hours, 8:30 AM to 5:00 PM Monday through Friday, call [phone].  After hours and on weekends, please call the GI answering service at [phone] who will take a message and have the physician on call contact you.   Following lower endoscopy (colonoscopy or flexible sigmoidoscopy):  Excessive amounts of blood in the stool  Significant tenderness or worsening of abdominal pains  Swelling of the abdomen that is new, acute  Fever of 100F or higher   FOLLOW UP: If any biopsies were taken you will be contacted by phone or by letter within the next 1-3 weeks.  Call your gastroenterologist if you have not heard about the biopsies in 3 weeks.  Our staff will call the home number listed on your records the next business day following your procedure to check on you and address any questions or concerns that you may have at that time regarding the information given to you following your procedure. This is a courtesy call and so if there is no answer at the home number and we have not heard from you through the emergency physician on call, we will assume that you have returned to your regular daily activities without incident.  SIGNATURES/CONFIDENTIALITY: You and/or your care partner have signed paperwork which will be entered into your electronic medical record.  These signatures attest to the fact that that the  information above on your After Visit Summary has been reviewed and is understood.  Full responsibility of the confidentiality of this discharge information lies with you and/or your care-partner.

## 2012-12-05 NOTE — Progress Notes (Signed)
Called to room to assist during endoscopic procedure.  Patient ID and intended procedure confirmed with present staff. Received instructions for my participation in the procedure from the performing physician.Called to room to assist during endoscopic procedure.  Patient ID and intended procedure confirmed with present staff. Received instructions for my participation in the procedure from the performing physician. 

## 2012-12-06 ENCOUNTER — Telehealth: Payer: Self-pay | Admitting: *Deleted

## 2012-12-06 NOTE — Telephone Encounter (Signed)
  Follow up Call-  Call back number 12/05/2012  Post procedure Call Back phone  # [phone]  Permission to leave phone message Yes     Patient questions:  Do you have a fever, pain , or abdominal swelling? no Pain Score  0 *  Have you tolerated food without any problems? yes  Have you been able to return to your normal activities? yes  Do you have any questions about your discharge instructions: Diet   no Medications  no Follow up visit  no  Do you have questions or concerns about your Care? no  Actions: * If pain score is 4 or above: No action needed, pain <4.

## 2012-12-07 ENCOUNTER — Encounter (HOSPITAL_COMMUNITY)
Admission: RE | Admit: 2012-12-07 | Discharge: 2012-12-07 | Disposition: A | Discharge status: Home / Self Care | Payer: 59 | Source: Ambulatory Visit | Attending: Surgery | Admitting: Surgery

## 2012-12-07 ENCOUNTER — Encounter (HOSPITAL_COMMUNITY): Payer: Self-pay

## 2012-12-07 ENCOUNTER — Encounter (HOSPITAL_COMMUNITY)
Admission: RE | Admit: 2012-12-07 | Discharge: 2012-12-07 | Disposition: A | Discharge status: Home / Self Care | Payer: 59 | Source: Ambulatory Visit | Attending: Anesthesiology | Admitting: Anesthesiology

## 2012-12-07 DIAGNOSIS — Z01812 Encounter for preprocedural laboratory examination: Secondary | ICD-10-CM | POA: Insufficient documentation

## 2012-12-07 DIAGNOSIS — I517 Cardiomegaly: Secondary | ICD-10-CM | POA: Insufficient documentation

## 2012-12-07 DIAGNOSIS — I10 Essential (primary) hypertension: Secondary | ICD-10-CM | POA: Insufficient documentation

## 2012-12-07 DIAGNOSIS — Z87891 Personal history of nicotine dependence: Secondary | ICD-10-CM | POA: Insufficient documentation

## 2012-12-07 DIAGNOSIS — Z01818 Encounter for other preprocedural examination: Secondary | ICD-10-CM | POA: Insufficient documentation

## 2012-12-07 LAB — URINALYSIS, ROUTINE W REFLEX MICROSCOPIC
Glucose, UA: NEGATIVE mg/dL
Leukocytes, UA: NEGATIVE
pH: 5.5 (ref 5.0–8.0)

## 2012-12-07 LAB — COMPREHENSIVE METABOLIC PANEL
ALT: 33 U/L (ref 0–53)
AST: 22 U/L (ref 0–37)
Albumin: 3.5 g/dL (ref 3.5–5.2)
Alkaline Phosphatase: 62 U/L (ref 39–117)
GFR calc Af Amer: >90 mL/min (ref >90)
Potassium: 3.9 mEq/L (ref 3.5–5.1)
Sodium: 143 mEq/L (ref 135–145)
Total Protein: 7.2 g/dL (ref 6.0–8.3)

## 2012-12-07 LAB — CBC
Hemoglobin: 14.3 g/dL (ref 13.0–17.0)
MCH: 35 pg — ABNORMAL HIGH (ref 26.0–34.0)
MCHC: 34.7 g/dL (ref 30.0–36.0)
MCV: 100.7 fL — ABNORMAL HIGH (ref 78.0–100.0)
Platelets: 265 10*3/uL (ref 150–400)
RBC: 4.09 MIL/uL — ABNORMAL LOW (ref 4.22–5.81)

## 2012-12-07 LAB — PROTIME-INR
INR: 1 (ref 0.00–1.49)
Prothrombin Time: 13.1 seconds (ref 11.6–15.2)

## 2012-12-07 NOTE — Pre-Procedure Instructions (Addendum)
Douglas Christensen  12/07/2012   Your procedure is scheduled on:  Tuesday, January 14th  Report to [facility] at  8:30 AM.  Call this number if you have problems the morning of surgery: [phone]   Remember:   Do not eat food or drink liquids after midnight.   Take these medicines the morning of surgery with A SIP OF WATER: Atenolol (Tenormin), Rantidine (Zantac). May take Tramadol (Ultram) if needed.    Stop Euclid 5 days prior to surgery and take Aspirin 81mg .    Do not wear jewelry, make-up or nail polish.  Do not wear lotions, powders, or perfumes. You may wear deodorant.  Do not shave 48 hours prior to surgery. Men may shave face and neck.  Do not bring valuables to the hospital.  Contacts, dentures or bridgework may not be worn into surgery.  Leave suitcase in the car. After surgery it may be brought to your room.  For patients admitted to the hospital, checkout time is 11:00 AM the day of  discharge.   Patients discharged the day of surgery will not be allowed to drive  home.  Name and phone number of your driver: NA    Special Instructions: Shower using CHG 2 nights before surgery and the night before surgery.  If you shower the day of surgery use CHG.  Use special wash - you have one bottle of CHG for all showers.  You should use approximately 1/3 of the bottle for each shower.   Please read over the following fact sheets that you were given: Pain Booklet, Coughing and Deep Breathing, Blood Transfusion Information and Surgical Site Infection Prevention

## 2012-12-07 NOTE — Progress Notes (Signed)
Mr Douglas Christensen is on a Research medication that Dr Trula Slade said to stop prior to surgery- pt not sure when he is to stop it.  I reviewed Dr Stephens Shire office notes and saw that patient is to discontinue Research medication 5 days prior to surgery and t ostart taking Aspirin 81mg .  Pt took research medication today.  I notified Arbie Cookey at Dr Stephens Shire office of this and she will notify Dr Trula Slade on Monday.  Pt understands to stop Research medication as of now and to take 81mg  of Aspirin daily.

## 2012-12-08 DIAGNOSIS — G4737 Central sleep apnea in conditions classified elsewhere: Secondary | ICD-10-CM

## 2012-12-08 DIAGNOSIS — R0609 Other forms of dyspnea: Secondary | ICD-10-CM

## 2012-12-08 DIAGNOSIS — R0989 Other specified symptoms and signs involving the circulatory and respiratory systems: Secondary | ICD-10-CM

## 2012-12-08 DIAGNOSIS — G4733 Obstructive sleep apnea (adult) (pediatric): Secondary | ICD-10-CM

## 2012-12-08 NOTE — Procedures (Cosign Needed)
NAME:  [name], Douglas Christensen                   ACCOUNT NO.:  [id number]  MEDICAL RECORD NO.:  [id number]          PATIENT TYPE:  OUT  LOCATION:  SLEEP CENTER                 FACILITY:  [facility]  PHYSICIAN:  Clinton D. Annamaria Boots, MD, FCCP, FACPDATE OF BIRTH:  06[birth year]  DATE OF STUDY:  11/22/2012                           NOCTURNAL POLYSOMNOGRAM  REFERRING PHYSICIAN:  Evie Lacks. Plotnikov, MD  REFERRING PHYSICIAN:  Evie Lacks. Plotnikov, MD  INDICATION FOR STUDY:  Hypersomnia with sleep apnea.  This is an unattended home sleep study.  The data section is recorded within the notes section of Epic.  EPWORTH SLEEPINESS SCORE:  15/24.  BMI 28.5, weight 171 pounds, height 5 feet 5 inches, neck 16 inches.  HOME MEDICATION:  Charted and reviewed.  IMPRESSION: 1. 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 2. Snoring with oxygen desaturation to a nadir of 83% and a mean     saturation through the study of 92% on room air. 3. Regular cardiac rhythm with mean heart rate 69 per minute. 4. This is the 3rd attempt to complete an overnight sleep study.  Each     time the patient would remove the leads and terminate the study     early despite counseling.  This is the only study which he allowed     to progress long enough for evaluation, ending recording at 2:02     a.m.  RECOMMENDATION:  Recommend treating his obstructive apnea with CPAP, the preferred initial treatment.  Other options may be assessed individually.  The central component is less likely to respond and may indicate reduced CNS perfusion.  Consider the possibility of nocturnal confusion.     Clinton D. Annamaria Boots, MD, [facility] [facility], [place], [place] [employer]    CDY/MEDQ  D:  12/08/2012 09:52:43  T:  12/08/2012 12:19:19  Job:  [id number]

## 2012-12-10 ENCOUNTER — Encounter: Payer: Self-pay | Admitting: Gastroenterology

## 2012-12-10 ENCOUNTER — Telehealth: Payer: Self-pay

## 2012-12-10 ENCOUNTER — Other Ambulatory Visit: Payer: Self-pay

## 2012-12-10 DIAGNOSIS — Z0279 Encounter for issue of other medical certificate: Secondary | ICD-10-CM

## 2012-12-10 NOTE — Telephone Encounter (Signed)
Pt. notified to reschedule his surgery form 12/11/12 to 12/27/12, due to not stopping his Gertie Gowda Drug at least 5 days prior to surgery; per recommendation of Dr. Trula Slade.  Pt. Made aware to take last dose of his study drug on 12/21/12,and begin holding this on 12/22/12, then, start ASA 81 mg on 12/22/12, and continue the ASA, daily, until told to resume the study drug following surgery.  Pt. verb. understanding of instructions.

## 2012-12-18 ENCOUNTER — Encounter: Payer: Self-pay | Admitting: Internal Medicine

## 2012-12-18 ENCOUNTER — Other Ambulatory Visit: Payer: Self-pay

## 2012-12-18 ENCOUNTER — Encounter (HOSPITAL_COMMUNITY)
Admission: RE | Admit: 2012-12-18 | Discharge: 2012-12-18 | Disposition: A | Discharge status: Home / Self Care | Payer: 59 | Source: Ambulatory Visit | Attending: Surgery | Admitting: Surgery

## 2012-12-18 LAB — COMPREHENSIVE METABOLIC PANEL
ALT: 36 U/L (ref 0–53)
AST: 30 U/L (ref 0–37)
CO2: 22 mEq/L (ref 19–32)
Calcium: 9.5 mg/dL (ref 8.4–10.5)
Creatinine, Ser: 0.87 mg/dL (ref 0.50–1.35)
GFR calc Af Amer: >90 mL/min (ref >90)
GFR calc non Af Amer: >90 mL/min (ref >90)
Glucose, Bld: 80 mg/dL (ref 70–99)
Sodium: 139 mEq/L (ref 135–145)
Total Protein: 7 g/dL (ref 6.0–8.3)

## 2012-12-18 LAB — CBC
MCH: 35.5 pg — ABNORMAL HIGH (ref 26.0–34.0)
MCHC: 35.2 g/dL (ref 30.0–36.0)
MCV: 100.9 fL — ABNORMAL HIGH (ref 78.0–100.0)
Platelets: 213 10*3/uL (ref 150–400)
RBC: 4.45 MIL/uL (ref 4.22–5.81)

## 2012-12-18 LAB — TYPE AND SCREEN: Antibody Screen: NEGATIVE

## 2012-12-18 LAB — URINALYSIS, ROUTINE W REFLEX MICROSCOPIC
Bilirubin Urine: NEGATIVE
Ketones, ur: NEGATIVE mg/dL
Leukocytes, UA: NEGATIVE
Nitrite: NEGATIVE
Urobilinogen, UA: 0.2 mg/dL (ref 0.0–1.0)

## 2012-12-18 LAB — PROTIME-INR
INR: 0.93 (ref 0.00–1.49)
Prothrombin Time: 12.4 seconds (ref 11.6–15.2)

## 2012-12-18 NOTE — Pre-Procedure Instructions (Signed)
[facility]  12/18/2012   Your procedure is scheduled on:  Thursday December 27, 2012  Report to [facility] at [facility].  Call this number if you have problems the morning of surgery: [phone]   Remember:   Do not eat food or drink liquids after midnight.   Take these medicines the morning of surgery with A SIP OF WATER: Atenolol, Zantac, And Tramadol if needed. Bring inhaler ADVAir   Do not wear jewelry.   You may wear deodorant. No lotions or perfumes.   Men may shave face and neck.  Do not bring valuables to the hospital.  Contacts, dentures or bridgework may not be worn into surgery.  Leave suitcase in the car. After surgery it may be brought to your room.  For patients admitted to the hospital, checkout time is 11:00 AM the day of  discharge.   Patients discharged the day of surgery will not be allowed to drive  home.    Special Instructions: Shower using CHG 2 nights before surgery and the night before surgery.  If you shower the day of surgery use CHG.  Use special wash - you have one bottle of CHG for all showers.  You should use approximately 1/3 of the bottle for each shower.   Please read over the following fact sheets that you were given: Pain Booklet, Coughing and Deep Breathing, Blood Transfusion Information, MRSA Information and Surgical Site Infection Prevention

## 2012-12-19 ENCOUNTER — Encounter (HOSPITAL_COMMUNITY): Payer: Self-pay | Admitting: Anesthesiology

## 2012-12-19 NOTE — Consult Note (Signed)
Anesthesia Chart Review:  Patient is a 62 year old male scheduled for right FPBG on 12/27/12 by Dr. Trula Slade.  History includes smoking, COPD, HTN, CAD (remote history of cath with 10/10/11 notes indicating non-obstructive CAD at that time; no ischemia by 2002 stress test), PAD s/p right SFA stent '08 (now occluded) and left EIA stent '12, GERD, HLD, meralgia paresthetica of the left side '11. He was diagnosed with moderate obstructive and central sleep apnea/hypopnea syndrome, AHI 15.8 per hour by sleep study on 11/22/12.  He is involved in the Euclid study that compares CV effects of Ticagrelor and Clopidogrel in PAD patients, and was instructed by Dr. Trula Slade to hold his study drug for 5 days pre-operatively and start a baby ASA.  PCP is listed as Dr. Alain Marion.  Cardiologist is Dr. Burt Knack. He is the one who referred patient to [facility] for consideration of FPBG. Notes by Dr. Percival Spanish from 10/10/11 indicate a cath "years ago" showed non-obstructive CAD, but no report is currently available.  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  He has had findings of possible inferior infarct on EKGs dating back to at least 04/03/01.  Overall, his EKG appears stable since his last EKG with Dr. Burt Knack on 11/07/12.  He has a remote history of cardiac cath (> 10 years ago--no copy found in Epic or e-chart).  His last stress test was also > 10 years ago (04/05/01) and showed no evidence of exercise induced ischemia, submaximal achieved HR, EF 60%.  Carotid duplex on 11/30/12 showed 1-39% bilateral ICA stenosis.  CXR on 12/07/12 showed moderate cardiomegaly, no active lung disease.  Pre-operative labs noted.  Patient was referred for FPBG by his cardiologist.  No additional testing was ordered at that time.  I think he EKG is overall stable.  There is no documented history of recent chest pain.  If no significant change in his status  then would anticipate he could proceed as planned.  Myra Gianotti, PA-C 12/19/12 1416

## 2012-12-26 MED ORDER — CEFUROXIME SODIUM 1.5 G IJ SOLR
1.5000 g | INTRAMUSCULAR | Status: AC
  Administered 2012-12-27: 1.5 g via INTRAVENOUS
  Filled 2012-12-26: qty 1.5

## 2012-12-27 ENCOUNTER — Encounter (HOSPITAL_COMMUNITY)
Admission: RE | Disposition: A | Discharge status: Home / Self Care | Payer: Self-pay | Source: Ambulatory Visit | Attending: Surgery

## 2012-12-27 ENCOUNTER — Inpatient Hospital Stay (HOSPITAL_COMMUNITY): Payer: 59 | Admitting: Anesthesiology

## 2012-12-27 ENCOUNTER — Inpatient Hospital Stay (HOSPITAL_COMMUNITY)
Admission: RE | Admit: 2012-12-27 | Discharge: 2012-12-29 | DRG: 254 | Disposition: A | Discharge status: Home / Self Care | Payer: 59 | Source: Ambulatory Visit | Attending: Surgery | Admitting: Surgery

## 2012-12-27 ENCOUNTER — Encounter (HOSPITAL_COMMUNITY): Payer: Self-pay | Admitting: Anesthesiology

## 2012-12-27 ENCOUNTER — Encounter (HOSPITAL_COMMUNITY): Payer: Self-pay | Admitting: *Deleted

## 2012-12-27 DIAGNOSIS — G5710 Meralgia paresthetica, unspecified lower limb: Secondary | ICD-10-CM | POA: Diagnosis present

## 2012-12-27 DIAGNOSIS — Z01812 Encounter for preprocedural laboratory examination: Secondary | ICD-10-CM

## 2012-12-27 DIAGNOSIS — Z8711 Personal history of peptic ulcer disease: Secondary | ICD-10-CM

## 2012-12-27 DIAGNOSIS — I743 Embolism and thrombosis of arteries of the lower extremities: Secondary | ICD-10-CM

## 2012-12-27 DIAGNOSIS — I739 Peripheral vascular disease, unspecified: Secondary | ICD-10-CM

## 2012-12-27 DIAGNOSIS — J4489 Other specified chronic obstructive pulmonary disease: Secondary | ICD-10-CM | POA: Diagnosis present

## 2012-12-27 DIAGNOSIS — I70219 Atherosclerosis of native arteries of extremities with intermittent claudication, unspecified extremity: Secondary | ICD-10-CM

## 2012-12-27 DIAGNOSIS — E559 Vitamin D deficiency, unspecified: Secondary | ICD-10-CM | POA: Diagnosis present

## 2012-12-27 DIAGNOSIS — E785 Hyperlipidemia, unspecified: Secondary | ICD-10-CM | POA: Diagnosis present

## 2012-12-27 DIAGNOSIS — I2510 Atherosclerotic heart disease of native coronary artery without angina pectoris: Secondary | ICD-10-CM | POA: Diagnosis present

## 2012-12-27 DIAGNOSIS — I10 Essential (primary) hypertension: Secondary | ICD-10-CM | POA: Diagnosis present

## 2012-12-27 DIAGNOSIS — J449 Chronic obstructive pulmonary disease, unspecified: Secondary | ICD-10-CM | POA: Diagnosis present

## 2012-12-27 DIAGNOSIS — G4733 Obstructive sleep apnea (adult) (pediatric): Secondary | ICD-10-CM | POA: Diagnosis present

## 2012-12-27 DIAGNOSIS — K219 Gastro-esophageal reflux disease without esophagitis: Secondary | ICD-10-CM | POA: Diagnosis present

## 2012-12-27 HISTORY — PX: FEMORAL-POPLITEAL BYPASS GRAFT: SHX937

## 2012-12-27 SURGERY — BYPASS GRAFT FEMORAL-POPLITEAL ARTERY
Anesthesia: General | Site: Leg Upper | Laterality: Right | Wound class: Clean

## 2012-12-27 MED ORDER — ONDANSETRON HCL 4 MG/2ML IJ SOLN: 4.0000 mg | Freq: Four times a day (QID) | INTRAMUSCULAR | Status: DC | PRN

## 2012-12-27 MED ORDER — STUDY - INVESTIGATIONAL MEDICATION
1.0000 | Freq: Every day | Status: DC
  Administered 2012-12-28 – 2012-12-29 (×2): 1 via ORAL
  Filled 2012-12-27 (×8): qty 1

## 2012-12-27 MED ORDER — FENTANYL CITRATE 0.05 MG/ML IJ SOLN
INTRAMUSCULAR | Status: DC | PRN
  Administered 2012-12-27 (×2): 100 ug via INTRAVENOUS
  Administered 2012-12-27: 50 ug via INTRAVENOUS
  Administered 2012-12-27: 100 ug via INTRAVENOUS
  Administered 2012-12-27: 50 ug via INTRAVENOUS

## 2012-12-27 MED ORDER — ATENOLOL 50 MG PO TABS
50.0000 mg | ORAL_TABLET | Freq: Every day | ORAL | Status: DC
  Administered 2012-12-28 – 2012-12-29 (×2): 50 mg via ORAL
  Filled 2012-12-27 (×2): qty 1

## 2012-12-27 MED ORDER — ACETAMINOPHEN 325 MG PO TABS: 325.0000 mg | ORAL_TABLET | ORAL | Status: DC | PRN

## 2012-12-27 MED ORDER — SENNOSIDES-DOCUSATE SODIUM 8.6-50 MG PO TABS
1.0000 | ORAL_TABLET | Freq: Every evening | ORAL | Status: DC | PRN
  Filled 2012-12-27: qty 1

## 2012-12-27 MED ORDER — DEXTROSE 5 % IV SOLN
INTRAVENOUS | Status: AC
  Filled 2012-12-27: qty 50

## 2012-12-27 MED ORDER — ROCURONIUM BROMIDE 100 MG/10ML IV SOLN
INTRAVENOUS | Status: DC | PRN
  Administered 2012-12-27: 50 mg via INTRAVENOUS
  Administered 2012-12-27 (×2): 10 mg via INTRAVENOUS
  Administered 2012-12-27 (×2): 20 mg via INTRAVENOUS
  Administered 2012-12-27 (×2): 10 mg via INTRAVENOUS

## 2012-12-27 MED ORDER — EPHEDRINE SULFATE 50 MG/ML IJ SOLN
INTRAMUSCULAR | Status: DC | PRN
  Administered 2012-12-27: 10 mg via INTRAVENOUS

## 2012-12-27 MED ORDER — OXYCODONE HCL 5 MG PO TABS
5.0000 mg | ORAL_TABLET | Freq: Once | ORAL | Status: DC | PRN
Start: 2012-12-27 — End: 2012-12-27

## 2012-12-27 MED ORDER — PANTOPRAZOLE SODIUM 40 MG PO TBEC
40.0000 mg | DELAYED_RELEASE_TABLET | Freq: Every day | ORAL | Status: DC
  Administered 2012-12-27 – 2012-12-28 (×2): 40 mg via ORAL
  Filled 2012-12-27 (×2): qty 1

## 2012-12-27 MED ORDER — STUDY - INVESTIGATIONAL MEDICATION
1.0000 | Freq: Two times a day (BID) | Status: DC
  Administered 2012-12-28 – 2012-12-29 (×3): 1 via ORAL
  Filled 2012-12-27 (×6): qty 1

## 2012-12-27 MED ORDER — OXYCODONE HCL 5 MG PO TABS
ORAL_TABLET | ORAL | Status: AC
  Filled 2012-12-27: qty 2

## 2012-12-27 MED ORDER — LOSARTAN POTASSIUM-HCTZ 100-25 MG PO TABS: 1.0000 | ORAL_TABLET | Freq: Every day | ORAL | Status: DC

## 2012-12-27 MED ORDER — CEFUROXIME SODIUM 1.5 G IJ SOLR
INTRAMUSCULAR | Status: AC
  Filled 2012-12-27: qty 1.5

## 2012-12-27 MED ORDER — MORPHINE SULFATE 2 MG/ML IJ SOLN
INTRAMUSCULAR | Status: AC
  Administered 2012-12-27: 2 mg via INTRAVENOUS
  Filled 2012-12-27: qty 1

## 2012-12-27 MED ORDER — LOSARTAN POTASSIUM 50 MG PO TABS
100.0000 mg | ORAL_TABLET | Freq: Every day | ORAL | Status: DC
  Filled 2012-12-27 (×2): qty 2

## 2012-12-27 MED ORDER — HYDROMORPHONE HCL PF 1 MG/ML IJ SOLN: 0.2500 mg | INTRAMUSCULAR | Status: DC | PRN

## 2012-12-27 MED ORDER — FENTANYL CITRATE 0.05 MG/ML IJ SOLN: 50.0000 ug | Freq: Once | INTRAMUSCULAR | Status: DC

## 2012-12-27 MED ORDER — GLYCOPYRROLATE 0.2 MG/ML IJ SOLN
INTRAMUSCULAR | Status: DC | PRN
  Administered 2012-12-27: .4 mg via INTRAVENOUS
  Administered 2012-12-27: .2 mg via INTRAVENOUS

## 2012-12-27 MED ORDER — HEPARIN SODIUM (PORCINE) 1000 UNIT/ML IJ SOLN
INTRAMUSCULAR | Status: DC | PRN
  Administered 2012-12-27: 3000 [IU] via INTRAVENOUS
  Administered 2012-12-27: 6000 [IU] via INTRAVENOUS
  Administered 2012-12-27: 3000 [IU] via INTRAVENOUS

## 2012-12-27 MED ORDER — SODIUM CHLORIDE 0.9 % IR SOLN
Status: DC | PRN
  Administered 2012-12-27: 09:00:00

## 2012-12-27 MED ORDER — DOPAMINE-DEXTROSE 3.2-5 MG/ML-% IV SOLN: 3.0000 ug/kg/min | INTRAVENOUS | Status: DC

## 2012-12-27 MED ORDER — PHENYLEPHRINE HCL 10 MG/ML IJ SOLN
INTRAMUSCULAR | Status: DC | PRN
  Administered 2012-12-27: 80 ug via INTRAVENOUS
  Administered 2012-12-27 (×2): 40 ug via INTRAVENOUS
  Administered 2012-12-27 (×2): 80 ug via INTRAVENOUS
  Administered 2012-12-27: 40 ug via INTRAVENOUS
  Administered 2012-12-27: 80 ug via INTRAVENOUS
  Administered 2012-12-27 (×2): 40 ug via INTRAVENOUS

## 2012-12-27 MED ORDER — VITAMIN D3 25 MCG (1000 UNIT) PO TABS
1000.0000 [IU] | ORAL_TABLET | Freq: Every day | ORAL | Status: DC
  Administered 2012-12-27 – 2012-12-29 (×3): 1000 [IU] via ORAL
  Filled 2012-12-27 (×3): qty 1

## 2012-12-27 MED ORDER — MORPHINE SULFATE 2 MG/ML IJ SOLN
2.0000 mg | INTRAMUSCULAR | Status: DC | PRN
  Administered 2012-12-27 – 2012-12-28 (×4): 2 mg via INTRAVENOUS
  Filled 2012-12-27: qty 1
  Filled 2012-12-27: qty 2
  Filled 2012-12-27 (×3): qty 1

## 2012-12-27 MED ORDER — SODIUM CHLORIDE 0.9 % IV SOLN: INTRAVENOUS | Status: DC

## 2012-12-27 MED ORDER — PROMETHAZINE HCL 25 MG/ML IJ SOLN: 6.2500 mg | INTRAMUSCULAR | Status: DC | PRN

## 2012-12-27 MED ORDER — MIDAZOLAM HCL 2 MG/2ML IJ SOLN: 1.0000 mg | INTRAMUSCULAR | Status: DC | PRN

## 2012-12-27 MED ORDER — GUAIFENESIN-DM 100-10 MG/5ML PO SYRP: 15.0000 mL | ORAL_SOLUTION | ORAL | Status: DC | PRN

## 2012-12-27 MED ORDER — NEOSTIGMINE METHYLSULFATE 1 MG/ML IJ SOLN
INTRAMUSCULAR | Status: DC | PRN
  Administered 2012-12-27: 3 mg via INTRAVENOUS

## 2012-12-27 MED ORDER — HYDRALAZINE HCL 20 MG/ML IJ SOLN
10.0000 mg | INTRAMUSCULAR | Status: DC | PRN
  Filled 2012-12-27: qty 0.5

## 2012-12-27 MED ORDER — LACTATED RINGERS IV SOLN
INTRAVENOUS | Status: DC | PRN
  Administered 2012-12-27 (×4): via INTRAVENOUS

## 2012-12-27 MED ORDER — INFLUENZA VIRUS VACC SPLIT PF IM SUSP
0.5000 mL | INTRAMUSCULAR | Status: AC
  Administered 2012-12-28: 0.5 mL via INTRAMUSCULAR
  Filled 2012-12-27: qty 0.5

## 2012-12-27 MED ORDER — ONDANSETRON HCL 4 MG/2ML IJ SOLN
INTRAMUSCULAR | Status: DC | PRN
  Administered 2012-12-27: 4 mg via INTRAVENOUS

## 2012-12-27 MED ORDER — METOPROLOL TARTRATE 1 MG/ML IV SOLN: 2.0000 mg | INTRAVENOUS | Status: DC | PRN

## 2012-12-27 MED ORDER — LIDOCAINE HCL (CARDIAC) 20 MG/ML IV SOLN
INTRAVENOUS | Status: DC | PRN
  Administered 2012-12-27: 100 mg via INTRAVENOUS

## 2012-12-27 MED ORDER — DOCUSATE SODIUM 100 MG PO CAPS
100.0000 mg | ORAL_CAPSULE | Freq: Every day | ORAL | Status: DC
  Administered 2012-12-28 – 2012-12-29 (×2): 100 mg via ORAL
  Filled 2012-12-27 (×2): qty 1

## 2012-12-27 MED ORDER — MAGNESIUM SULFATE 40 MG/ML IJ SOLN
2.0000 g | Freq: Once | INTRAMUSCULAR | Status: AC | PRN
  Filled 2012-12-27: qty 50

## 2012-12-27 MED ORDER — HEMOSTATIC AGENTS (NO CHARGE) OPTIME
TOPICAL | Status: DC | PRN
  Administered 2012-12-27 (×2): 1 via TOPICAL

## 2012-12-27 MED ORDER — OXYCODONE HCL 5 MG PO TABS
5.0000 mg | ORAL_TABLET | ORAL | Status: DC | PRN
  Administered 2012-12-27 – 2012-12-28 (×4): 10 mg via ORAL
  Administered 2012-12-28: 5 mg via ORAL
  Administered 2012-12-28 – 2012-12-29 (×2): 10 mg via ORAL
  Filled 2012-12-27 (×6): qty 2

## 2012-12-27 MED ORDER — NICOTINE 21 MG/24HR TD PT24
21.0000 mg | MEDICATED_PATCH | Freq: Every day | TRANSDERMAL | Status: DC
  Administered 2012-12-27: 21 mg via TRANSDERMAL
  Filled 2012-12-27 (×2): qty 1

## 2012-12-27 MED ORDER — DEXAMETHASONE SODIUM PHOSPHATE 4 MG/ML IJ SOLN
INTRAMUSCULAR | Status: DC | PRN
  Administered 2012-12-27: 8 mg via INTRAVENOUS

## 2012-12-27 MED ORDER — MOMETASONE FURO-FORMOTEROL FUM 100-5 MCG/ACT IN AERO
2.0000 | INHALATION_SPRAY | Freq: Two times a day (BID) | RESPIRATORY_TRACT | Status: DC
  Administered 2012-12-27 – 2012-12-29 (×4): 2 via RESPIRATORY_TRACT
  Filled 2012-12-27: qty 8.8

## 2012-12-27 MED ORDER — LABETALOL HCL 5 MG/ML IV SOLN
10.0000 mg | INTRAVENOUS | Status: DC | PRN
  Filled 2012-12-27: qty 4

## 2012-12-27 MED ORDER — 0.9 % SODIUM CHLORIDE (POUR BTL) OPTIME
TOPICAL | Status: DC | PRN
  Administered 2012-12-27: 1000 mL

## 2012-12-27 MED ORDER — PHENOL 1.4 % MT LIQD
1.0000 | OROMUCOSAL | Status: DC | PRN
  Administered 2012-12-28: 1 via OROMUCOSAL
  Filled 2012-12-27: qty 177

## 2012-12-27 MED ORDER — ACETAMINOPHEN 650 MG RE SUPP: 325.0000 mg | RECTAL | Status: DC | PRN

## 2012-12-27 MED ORDER — ARTIFICIAL TEARS OP OINT
TOPICAL_OINTMENT | OPHTHALMIC | Status: DC | PRN
  Administered 2012-12-27: 1 via OPHTHALMIC

## 2012-12-27 MED ORDER — DEXTROSE 5 % IV SOLN
1.5000 g | Freq: Two times a day (BID) | INTRAVENOUS | Status: AC
  Administered 2012-12-27 – 2012-12-28 (×2): 1.5 g via INTRAVENOUS
  Filled 2012-12-27 (×2): qty 1.5

## 2012-12-27 MED ORDER — SODIUM CHLORIDE 0.9 % IV SOLN
INTRAVENOUS | Status: DC
  Administered 2012-12-27: 125 mL/h via INTRAVENOUS
  Administered 2012-12-27: 22:00:00 via INTRAVENOUS

## 2012-12-27 MED ORDER — BISACODYL 10 MG RE SUPP: 10.0000 mg | Freq: Every day | RECTAL | Status: DC | PRN

## 2012-12-27 MED ORDER — FAMOTIDINE 20 MG PO TABS
20.0000 mg | ORAL_TABLET | Freq: Every day | ORAL | Status: DC
  Administered 2012-12-28: 20 mg via ORAL
  Filled 2012-12-27 (×3): qty 1

## 2012-12-27 MED ORDER — OXYCODONE HCL 5 MG/5ML PO SOLN: 5.0000 mg | Freq: Once | ORAL | Status: DC | PRN

## 2012-12-27 MED ORDER — POTASSIUM CHLORIDE CRYS ER 20 MEQ PO TBCR: 20.0000 meq | EXTENDED_RELEASE_TABLET | Freq: Once | ORAL | Status: AC | PRN

## 2012-12-27 MED ORDER — MIDAZOLAM HCL 5 MG/5ML IJ SOLN
INTRAMUSCULAR | Status: DC | PRN
  Administered 2012-12-27: 2 mg via INTRAVENOUS

## 2012-12-27 MED ORDER — VITAMIN B-12 1000 MCG PO TABS
1000.0000 ug | ORAL_TABLET | Freq: Every day | ORAL | Status: DC
  Administered 2012-12-28 – 2012-12-29 (×2): 1000 ug via ORAL
  Filled 2012-12-27 (×2): qty 1

## 2012-12-27 MED ORDER — ALUM & MAG HYDROXIDE-SIMETH 200-200-20 MG/5ML PO SUSP: 15.0000 mL | ORAL | Status: DC | PRN

## 2012-12-27 MED ORDER — PROTAMINE SULFATE 10 MG/ML IV SOLN
INTRAVENOUS | Status: DC | PRN
  Administered 2012-12-27: 20 mg via INTRAVENOUS
  Administered 2012-12-27: 10 mg via INTRAVENOUS
  Administered 2012-12-27: 20 mg via INTRAVENOUS

## 2012-12-27 MED ORDER — HYDROCHLOROTHIAZIDE 25 MG PO TABS
25.0000 mg | ORAL_TABLET | Freq: Every day | ORAL | Status: DC
  Filled 2012-12-27 (×2): qty 1

## 2012-12-27 MED ORDER — PROPOFOL 10 MG/ML IV BOLUS
INTRAVENOUS | Status: DC | PRN
  Administered 2012-12-27: 50 mg via INTRAVENOUS
  Administered 2012-12-27: 150 mg via INTRAVENOUS

## 2012-12-27 MED ORDER — SODIUM CHLORIDE 0.9 % IV SOLN: 500.0000 mL | Freq: Once | INTRAVENOUS | Status: AC | PRN

## 2012-12-27 MED ORDER — MUPIROCIN 2 % EX OINT: TOPICAL_OINTMENT | Freq: Two times a day (BID) | CUTANEOUS | Status: DC

## 2012-12-27 SURGICAL SUPPLY — 70 items
BANDAGE ELASTIC 4 VELCRO ST LF (GAUZE/BANDAGES/DRESSINGS) IMPLANT
BANDAGE ESMARK 6X9 LF (GAUZE/BANDAGES/DRESSINGS) ×1 IMPLANT
BNDG ESMARK 6X9 LF (GAUZE/BANDAGES/DRESSINGS) ×2
CANISTER SUCTION 2500CC (MISCELLANEOUS) ×2 IMPLANT
CATH EMB 5FR 80CM (CATHETERS) ×4 IMPLANT
CLIP TI MEDIUM 24 (CLIP) ×2 IMPLANT
CLIP TI WIDE RED SMALL 24 (CLIP) ×4 IMPLANT
CLOTH BEACON ORANGE TIMEOUT ST (SAFETY) ×2 IMPLANT
COVER PROBE W GEL 5X96 (DRAPES) ×2 IMPLANT
COVER SURGICAL LIGHT HANDLE (MISCELLANEOUS) ×2 IMPLANT
CUFF TOURNIQUET SINGLE 24IN (TOURNIQUET CUFF) ×2 IMPLANT
CUFF TOURNIQUET SINGLE 34IN LL (TOURNIQUET CUFF) IMPLANT
CUFF TOURNIQUET SINGLE 44IN (TOURNIQUET CUFF) IMPLANT
DERMABOND ADVANCED (GAUZE/BANDAGES/DRESSINGS) ×3
DERMABOND ADVANCED .7 DNX12 (GAUZE/BANDAGES/DRESSINGS) ×3 IMPLANT
DRAIN CHANNEL 15F RND FF W/TCR (WOUND CARE) IMPLANT
DRAPE WARM FLUID 44X44 (DRAPE) ×2 IMPLANT
DRAPE X-RAY CASS 24X20 (DRAPES) IMPLANT
DRSG COVADERM 4X10 (GAUZE/BANDAGES/DRESSINGS) IMPLANT
DRSG COVADERM 4X8 (GAUZE/BANDAGES/DRESSINGS) IMPLANT
ELECT REM PT RETURN 9FT ADLT (ELECTROSURGICAL) ×2
ELECTRODE REM PT RTRN 9FT ADLT (ELECTROSURGICAL) ×1 IMPLANT
EVACUATOR SILICONE 100CC (DRAIN) IMPLANT
GLOVE BIO SURGEON STRL SZ7.5 (GLOVE) ×2 IMPLANT
GLOVE BIOGEL PI IND STRL 7.0 (GLOVE) ×1 IMPLANT
GLOVE BIOGEL PI IND STRL 7.5 (GLOVE) ×5 IMPLANT
GLOVE BIOGEL PI IND STRL 8 (GLOVE) ×1 IMPLANT
GLOVE BIOGEL PI IND STRL 8.5 (GLOVE) ×1 IMPLANT
GLOVE BIOGEL PI INDICATOR 7.0 (GLOVE) ×1
GLOVE BIOGEL PI INDICATOR 7.5 (GLOVE) ×5
GLOVE BIOGEL PI INDICATOR 8 (GLOVE) ×1
GLOVE BIOGEL PI INDICATOR 8.5 (GLOVE) ×1
GLOVE ECLIPSE 6.5 STRL STRAW (GLOVE) ×2 IMPLANT
GLOVE ECLIPSE 7.0 STRL STRAW (GLOVE) ×2 IMPLANT
GLOVE SURG SS PI 7.5 STRL IVOR (GLOVE) ×10 IMPLANT
GLOVE SURG SS PI 8.0 STRL IVOR (GLOVE) ×2 IMPLANT
GOWN PREVENTION PLUS XLARGE (GOWN DISPOSABLE) ×8 IMPLANT
GOWN PREVENTION PLUS XXLARGE (GOWN DISPOSABLE) ×2 IMPLANT
GOWN STRL NON-REIN LRG LVL3 (GOWN DISPOSABLE) ×8 IMPLANT
HEMOSTAT SNOW SURGICEL 2X4 (HEMOSTASIS) ×4 IMPLANT
HEMOSTAT SURGICEL 2X14 (HEMOSTASIS) IMPLANT
KIT BASIN OR (CUSTOM PROCEDURE TRAY) ×2 IMPLANT
KIT ROOM TURNOVER OR (KITS) ×2 IMPLANT
LOOP VESSEL MINI RED (MISCELLANEOUS) ×2 IMPLANT
MARKER GRAFT CORONARY BYPASS (MISCELLANEOUS) IMPLANT
NS IRRIG 1000ML POUR BTL (IV SOLUTION) ×4 IMPLANT
PACK PERIPHERAL VASCULAR (CUSTOM PROCEDURE TRAY) ×2 IMPLANT
PAD ARMBOARD 7.5X6 YLW CONV (MISCELLANEOUS) ×4 IMPLANT
PADDING CAST COTTON 6X4 STRL (CAST SUPPLIES) IMPLANT
SET COLLECT BLD 21X3/4 12 (NEEDLE) IMPLANT
STAPLER VISISTAT 35W (STAPLE) IMPLANT
STOPCOCK 4 WAY LG BORE MALE ST (IV SETS) ×2 IMPLANT
SUT ETHILON 3 0 PS 1 (SUTURE) IMPLANT
SUT PROLENE 5 0 C 1 24 (SUTURE) ×6 IMPLANT
SUT PROLENE 6 0 BV (SUTURE) ×10 IMPLANT
SUT PROLENE 7 0 BV 1 (SUTURE) IMPLANT
SUT SILK 3 0 (SUTURE) ×3
SUT SILK 3-0 18XBRD TIE 12 (SUTURE) ×3 IMPLANT
SUT VIC AB 2-0 CT1 27 (SUTURE) ×3
SUT VIC AB 2-0 CT1 TAPERPNT 27 (SUTURE) ×3 IMPLANT
SUT VIC AB 3-0 SH 27 (SUTURE) ×2
SUT VIC AB 3-0 SH 27X BRD (SUTURE) ×2 IMPLANT
SUT VICRYL 4-0 PS2 18IN ABS (SUTURE) ×6 IMPLANT
SYR 3ML LL SCALE MARK (SYRINGE) ×2 IMPLANT
TOWEL OR 17X24 6PK STRL BLUE (TOWEL DISPOSABLE) ×4 IMPLANT
TOWEL OR 17X26 10 PK STRL BLUE (TOWEL DISPOSABLE) ×4 IMPLANT
TRAY FOLEY CATH 14FRSI W/METER (CATHETERS) ×2 IMPLANT
TUBING EXTENTION W/L.L. (IV SETS) IMPLANT
UNDERPAD 30X30 INCONTINENT (UNDERPADS AND DIAPERS) ×2 IMPLANT
WATER STERILE IRR 1000ML POUR (IV SOLUTION) ×2 IMPLANT

## 2012-12-27 NOTE — Anesthesia Procedure Notes (Signed)
Procedure Name: Intubation Date/Time: 12/27/2012 7:38 AM Performed by: Mariea Clonts Pre-anesthesia Checklist: Patient identified, Timeout performed, Emergency Drugs available, Suction available and Patient being monitored Patient Re-evaluated:Patient Re-evaluated prior to inductionOxygen Delivery Method: Circle system utilized Preoxygenation: Pre-oxygenation with 100% oxygen Intubation Type: IV induction Ventilation: Mask ventilation without difficulty and Oral airway inserted - appropriate to patient size Laryngoscope Size: Sabra Heck and 2 Grade View: Grade III Tube type: Oral Tube size: 7.5 mm Number of attempts: 1 Placement Confirmation: ETT inserted through vocal cords under direct vision,  positive ETCO2 and breath sounds checked- equal and bilateral Secured at: 22 cm Tube secured with: Tape Dental Injury: Teeth and Oropharynx as per pre-operative assessment

## 2012-12-27 NOTE — Progress Notes (Signed)
Utilization review completed.  

## 2012-12-27 NOTE — H&P (View-Only) (Signed)
[facility] and [facility]in Specialist of [facility]   Patient name: Douglas Christensen MRN: [id number] DOB: [birth year] Sex: male   Referred by: Dr. Burt Christensen  Reason for referral:  Chief Complaint  Patient presents with  . New Evaluation    severe claudication - poss. rt fem-pop bp/ Dr. Sherren Christensen-  pt c/o of intermittent pain and claudication in both legs moreso in right    HISTORY OF PRESENT ILLNESS: This is a very pleasant 62 year old gentleman that is referred to me by Dr. Burt Christensen for evaluation of surgical treatment of peripheral vascular disease. He has recently undergone angiography which reveals an occluded right superficial femoral artery as well as a high-grade right common femoral stenosis. He has a history of right superficial femoral artery stenting in 2008. These have occluded. Also, one year ago he had stenting of his left iliac system. He currently has a high-grade left common femoral stenosis. The patient states that he can walk approximately 50-100 feet before he gets cramps. His cramps are relieved with rest. He denies ulceration or rest pain. He will occasionally get numbness in both feet.  The patient has a history of hypercholesterolemia. He is currently taking a statin. He has a 45 year history of tobacco abuse. He is medically managed for his hypertension. His blood pressure usually runs in the 120s. The patient is also involved in the Euclid study.  Past Medical History  Diagnosis Date  . History of colonic polyps   . COPD (chronic obstructive pulmonary disease)   . CAD (coronary artery disease)     Mild plaque (cath "years ago")  . GERD (gastroesophageal reflux disease)   . Hyperlipidemia   . Hypertension   . Vitamin D deficiency   . Meralgia paresthetica of left side 2011  . LBP (low back pain)   . PVD (peripheral vascular disease)     Stent to left common femoral and right superficial femoral.  2008.  50%  left renal     Past Surgical History  Procedure Date  . None    . Lower extremity stents     bilateral lower extremities    History   Social History  . Marital Status: Widowed    Spouse Name: N/A    Number of Children: N/A  . Years of Education: N/A   Occupational History  . Not on file.   Social History Main Topics  . Smoking status: Current Every Day Smoker -- 1.0 packs/day for 45 years    Types: Cigarettes  . Smokeless tobacco: Never Used     Comment: pt is going to register for the you can quit classes at [facility] starting on 12/14/2012  . Alcohol Use: 3.6 - 4.2 oz/week    2 Cans of beer, 4-5 Shots of liquor per week  . Drug Use: No  . Sexually Active: Yes   Other Topics Concern  . Not on file   Social History Narrative   Widower    Family History  Problem Relation Age of Onset  . Heart disease Mother     No clear CAD  . Hypertension Mother   . Cancer Mother     ? colon ca  . Hyperlipidemia Mother   . Cancer Father     brain tumor  . Alzheimer's disease Father   . Colon cancer Neg Hx     Allergies as of 11/30/2012 - Review Complete 11/30/2012  Allergen Reaction Noted  . Benazepril  08/31/2012    Current Outpatient Prescriptions on File  Prior to Visit  Medication Sig Dispense Refill  . AMBULATORY NON FORMULARY MEDICATION EUCLID RESEARCH STUDY. Plavix vs Brilinta.      Douglas Christensen atenolol (TENORMIN) 50 MG tablet Take 1 tablet (50 mg total) by mouth daily.  90 tablet  1  . atorvastatin (LIPITOR) 20 MG tablet Take 1 tablet (20 mg total) by mouth daily.  90 tablet  3  . cholecalciferol (VITAMIN D) 1000 UNITS tablet Take 1,000 Units by mouth daily.        . Fluticasone-Salmeterol (ADVAIR DISKUS) 100-50 MCG/DOSE AEPB Inhale 1 puff into the lungs 2 (two) times daily.  1 each  5  . MOVIPREP 100 G SOLR Take 1 kit (100 g total) by mouth once.  1 kit  0  . ranitidine (ZANTAC) 150 MG tablet Take 150 mg by mouth daily as needed. For heartburn       . sildenafil (VIAGRA) 100 MG tablet Take 1 tablet (100 mg total) by mouth daily as needed for  erectile dysfunction.  12 tablet  5  . traMADol (ULTRAM) 50 MG tablet Take 1-2 tablets (50-100 mg total) by mouth 2 (two) times daily as needed for pain.  60 tablet  3  . vitamin B-12 (CYANOCOBALAMIN) 1000 MCG tablet Take 1,000 mcg by mouth daily.      Douglas Christensen losartan-hydrochlorothiazide (HYZAAR) 100-25 MG per tablet Take 1 tablet by mouth daily.  90 tablet  3     REVIEW OF SYSTEMS: Cardiovascular: No chest pain, chest pressure, palpitations, orthopnea, or dyspnea on exertion. Positive for pain in legs with walking, right greater than left. No history of DVT or phlebitis. Pulmonary: No productive cough, asthma or wheezing. Neurologic: No weakness, paresthesias, aphasia, or amaurosis. No dizziness. Hematologic: No bleeding problems or clotting disorders. Musculoskeletal: No joint pain or joint swelling. Gastrointestinal: No blood in stool or hematemesis Genitourinary: No dysuria or hematuria. Psychiatric:: No history of major depression. Integumentary: No rashes or ulcers. Constitutional: No fever or chills.  PHYSICAL EXAMINATION: General: The patient appears their stated age.  Vital signs are BP 178/88  Pulse 92  Ht 5' 4.5" (1.638 m)  Wt 178 lb 11.2 oz (81.058 kg)  BMI 30.20 kg/m2  SpO2 99% HEENT:  No gross abnormalities Pulmonary: Respirations are non-labored Abdomen: Soft and non-tender  Musculoskeletal: There are no major deformities.   Neurologic: No focal weakness or paresthesias are detected, Skin: There are no ulcer or rashes noted. Psychiatric: The patient has normal affect. Cardiovascular: There is a regular rate and rhythm without significant murmur appreciated. Faintly palpable femoral pulse on the right. Pedal pulses are not palpable. No carotid bruits.  Diagnostic Studies: Carotid duplex was ordered and reviewed today: This shows 1-39% bilateral stenosis. Lower extremity vein mapping was ordered today. This shows an excellent saphenous vein bilaterally.  Outside  Studies/Documentation Historical records were reviewed.  They showed occluded right superficial femoral artery. High-grade bilateral common femoral artery stenosis  Medication Changes: The patient will need to be off of the Euclid study protocol for 5 days prior to his operation. While he is off this medicine he will take 81 mg aspirin  Assessment:  Bilateral claudication, right greater than left  Plan: I had an extensive discussion with the patient, outlining the extent of his disease. I have strongly encouraged him to stop smoking as this will have an impact on the patency of his bypass graft. Based on his vein mapping, he is a candidate for a femoral to above-knee popliteal artery bypass graft with vein. We  discussed the risks and benefits of the operation as well as the hospital stay. He understands the need for future surveillance to prevent graft failure. He would like to get this done as soon as possible. I have scheduled his operation for Tuesday, January 14. We will address his left leg based on the significance of his symptoms once he has recovered from his right leg surgery.      Eldridge Abrahams, M.D. [facility] [employer] Office: [phone] Pager:  [phone]

## 2012-12-27 NOTE — Anesthesia Preprocedure Evaluation (Signed)
Anesthesia Evaluation  Patient identified by MRN, date of birth, ID band Patient awake    Reviewed: Allergy & Precautions, H&P , NPO status , Patient's Chart, lab work & pertinent test results  Airway Mallampati: I TM Distance: >3 FB Neck ROM: Full    Dental   Pulmonary sleep apnea , COPDCurrent Smoker,  + rhonchi         Cardiovascular hypertension, + CAD and + Peripheral Vascular Disease Rhythm:Regular Rate:Normal     Neuro/Psych  Neuromuscular disease    GI/Hepatic GERD-  ,  Endo/Other    Renal/GU      Musculoskeletal   Abdominal   Peds  Hematology   Anesthesia Other Findings   Reproductive/Obstetrics                           Anesthesia Physical Anesthesia Plan  ASA: III  Anesthesia Plan: General   Post-op Pain Management:    Induction: Intravenous  Airway Management Planned: Oral ETT  Additional Equipment:   Intra-op Plan:   Post-operative Plan: Extubation in OR  Informed Consent: I have reviewed the patients History and Physical, chart, labs and discussed the procedure including the risks, benefits and alternatives for the proposed anesthesia with the patient or authorized representative who has indicated his/her understanding and acceptance.     Plan Discussed with: CRNA and Surgeon  Anesthesia Plan Comments:         Anesthesia Quick Evaluation

## 2012-12-27 NOTE — Anesthesia Postprocedure Evaluation (Signed)
  Anesthesia Post-op Note  Patient: Douglas Christensen  Procedure(s) Performed: Procedure(s) (LRB) with comments: BYPASS GRAFT FEMORAL-POPLITEAL ARTERY (Right) - using non-reversed sapphenous vein.  Patient Location: PACU  Anesthesia Type:General  Level of Consciousness: awake  Airway and Oxygen Therapy: Patient Spontanous Breathing  Post-op Pain: mild  Post-op Assessment: Post-op Vital signs reviewed, Patient's Cardiovascular Status Stable, Respiratory Function Stable, Patent Airway, No signs of Nausea or vomiting and Pain level controlled  Post-op Vital Signs: stable  Complications: No apparent anesthesia complications

## 2012-12-27 NOTE — Interval H&P Note (Signed)
History and Physical Interval Note:  12/27/2012 7:27 AM  Douglas Christensen  has presented today for surgery, with the diagnosis of PVD  The various methods of treatment have been discussed with the patient and family. After consideration of risks, benefits and other options for treatment, the patient has consented to  Procedure(s) (LRB) with comments: BYPASS GRAFT FEMORAL-POPLITEAL ARTERY (Right) as a surgical intervention .  The patient's history has been reviewed, patient examined, no change in status, stable for surgery.  I have reviewed the patient's chart and labs.  Questions were answered to the patient's satisfaction.     Maudie Shingledecker IV, V. WELLS

## 2012-12-27 NOTE — Progress Notes (Signed)
Went to give patient medicine at pm, while reviewing med's. Patient informed me that cozaar and HCTZ was not familiar to him. He states that he took his ASA and atenolol this morning. Patient didn't want to take the other medicine. Also he didn't take Pepcid(zantac) because he stated he only takes it as needed. Patient  Received protonix.

## 2012-12-27 NOTE — Progress Notes (Signed)
Pt admitted from PACU, oriented to unit and routines, pain management and safety discussed, pt verbalizes understanding, c/o pain 8 of 10, med given

## 2012-12-27 NOTE — Transfer of Care (Signed)
Immediate Anesthesia Transfer of Care Note  Patient: Douglas Christensen  Procedure(s) Performed: Procedure(s) (LRB) with comments: BYPASS GRAFT FEMORAL-POPLITEAL ARTERY (Right) - using non-reversed sapphenous vein.  Patient Location: PACU  Anesthesia Type:General  Level of Consciousness: awake, alert  and oriented  Airway & Oxygen Therapy: Patient Spontanous Breathing and Patient connected to nasal cannula oxygen  Post-op Assessment: Report given to PACU RN, Post -op Vital signs reviewed and stable and Patient moving all extremities X 4  Post vital signs: Reviewed and stable  Complications: No apparent anesthesia complications

## 2012-12-27 NOTE — Preoperative (Signed)
Beta Blockers   Reason not to administer Beta Blockers:Not Applicable 

## 2012-12-28 ENCOUNTER — Ambulatory Visit: Payer: 59 | Admitting: Cardiovascular Disease

## 2012-12-28 ENCOUNTER — Encounter (HOSPITAL_COMMUNITY): Payer: Self-pay | Admitting: Surgery

## 2012-12-28 LAB — CBC
HCT: 37.6 % — ABNORMAL LOW (ref 39.0–52.0)
Hemoglobin: 12.9 g/dL — ABNORMAL LOW (ref 13.0–17.0)
MCH: 34.9 pg — ABNORMAL HIGH (ref 26.0–34.0)
MCHC: 34.3 g/dL (ref 30.0–36.0)
RBC: 3.7 MIL/uL — ABNORMAL LOW (ref 4.22–5.81)

## 2012-12-28 LAB — BASIC METABOLIC PANEL
BUN: 14 mg/dL (ref 6–23)
CO2: 22 mEq/L (ref 19–32)
Calcium: 8.5 mg/dL (ref 8.4–10.5)
GFR calc non Af Amer: >90 mL/min (ref >90)
Glucose, Bld: 207 mg/dL — ABNORMAL HIGH (ref 70–99)
Potassium: 3.9 mEq/L (ref 3.5–5.1)
Sodium: 137 mEq/L (ref 135–145)

## 2012-12-28 MED ORDER — OXYCODONE HCL 5 MG PO TABS: 5.0000 mg | ORAL_TABLET | ORAL | Status: DC | PRN

## 2012-12-28 MED ORDER — NICOTINE 21 MG/24HR TD PT24
42.0000 mg | MEDICATED_PATCH | Freq: Every day | TRANSDERMAL | Status: DC
  Administered 2012-12-28 – 2012-12-29 (×2): 42 mg via TRANSDERMAL
  Filled 2012-12-28 (×2): qty 2

## 2012-12-28 NOTE — Progress Notes (Signed)
Patient has been transferred to [id number] via wheelchair after PT ambulated the patient in the hall. Phone report was called to Jeneen Rinks, Therapist, sports. Patient aware of the transfer.

## 2012-12-28 NOTE — Evaluation (Signed)
Occupational Therapy Evaluation Patient Details Name: Douglas Christensen MRN: [id number] DOB: 1951/11/24 Today's Date: 12/28/2012 Time: [id number] OT Time Calculation (min): 13 min  OT Assessment / Plan / Recommendation Clinical Impression  62 yo male admitted for #1: Right common femoral endarterectomy. #2: Right femoral to above-knee popliteal artery bypass graft with non-reversed translocated ipsilateral greater saphenous vein. Ot to follow acutely . Recommend RW and 3n 63for d/c planning.     OT Assessment  Patient needs continued OT Services    Follow Up Recommendations  No OT follow up    Barriers to Discharge      Equipment Recommendations  3 in 1 bedside comode (RW)    Recommendations for Other Services    Frequency  Min 2X/week    Precautions / Restrictions Precautions Precautions: Fall   Pertinent Vitals/Pain Reports 8 out 1o pain  Pt tolerating well Pt states "oh that's not as bad as I thought it would be"    ADL  Eating/Feeding: Independent Where Assessed - Eating/Feeding: Chair Grooming: Wash/dry hands;Wash/dry face;Supervision/safety Where Assessed - Grooming: Unsupported standing Lower Body Dressing: Supervision/safety Where Assessed - Lower Body Dressing: Supported sitting Toilet Transfer: Min Psychiatric nurse Method: Sit to Loss adjuster, chartered: Raised toilet seat with arms (or 3-in-1 over toilet) Toileting - Clothing Manipulation and Hygiene: Modified independent Where Assessed - Toileting Clothing Manipulation and Hygiene: Sit to stand from 3-in-1 or toilet Equipment Used: Gait belt (pushing W/c ) Transfers/Ambulation Related to ADLs: Pt ambulated pushing w/c for balance. Recommend RW for ambulation at d/c initially to incr independence ADL Comments: Pt is able to touch midfoot supported sitting in chair. Pt should be able to achieve LB dressing without AE within a few days . Pt will have girlfriend (A) at d/c per patient. Pt reports "that went  much better than I expected"    OT Diagnosis: Generalized weakness;Acute pain  OT Problem List: Decreased strength;Decreased activity tolerance;Impaired balance (sitting and/or standing);Decreased safety awareness;Decreased knowledge of use of DME or AE;Decreased knowledge of precautions;Pain OT Treatment Interventions: Self-care/ADL training;DME and/or AE instruction;Therapeutic activities;Patient/family education;Balance training   OT Goals Acute Rehab OT Goals OT Goal Formulation: With patient Time For Goal Achievement: 01/11/13 Potential to Achieve Goals: Good ADL Goals Pt Will Transfer to Toilet: with modified independence;Ambulation;Regular height toilet ADL Goal: Toilet Transfer - Progress: Goal set today Pt Will Perform Tub/Shower Transfer: with modified independence;with DME ADL Goal: Tub/Shower Transfer - Progress: Goal set today  Visit Information  Last OT Received On: 12/28/12 Assistance Needed: +1    Subjective Data  Subjective: my girlfriend stay there with me " Patient Stated Goal: to go home tomorrow   Prior Functioning     Home Living Lives With: Alone (girlfriend will stay temporarily ) Available Help at Discharge: Family;Friend(s) Type of Home: House Home Access: Stairs to enter [employer] of Steps: 4 Entrance Stairs-Rails: Right;Left;Can reach both Home Layout: One level Bathroom Shower/Tub: Product/process development scientist: Standard Bathroom Accessibility: Yes How Accessible: Accessible via walker [place]: Straight cane Prior Function Level of Independence: Independent Able to Take Stairs?: Yes Driving: Yes 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[employer]) Communication Communication: No difficulties Dominant Hand: Right         Vision/Perception     Cognition  Overall Cognitive Status: Appears within functional limits for tasks assessed/performed Arousal/Alertness:  Awake/alert Orientation Level: Appears intact for tasks assessed Behavior During Session: [facility] for tasks performed    Extremity/Trunk Assessment Right Upper Extremity Assessment  RUE ROM/Strength/Tone: Within functional levels RUE Sensation: WFL - Light Touch RUE Coordination: WFL - gross/fine motor Left Upper Extremity Assessment LUE ROM/Strength/Tone: Within functional levels LUE Sensation: WFL - Light Touch LUE Coordination: WFL - gross/fine motor Trunk Assessment Trunk Assessment: Normal     Mobility Bed Mobility Bed Mobility: Not assessed Transfers Transfers: Sit to Stand;Stand to Sit Sit to Stand: 4: Min guard;With upper extremity assist;From chair/3-in-1 Stand to Sit: With upper extremity assist;To chair/3-in-1;4: Min guard Details for Transfer Assistance: next session to address RW safety with transfers.     Shoulder Instructions     Exercise     Balance Static Standing Balance Static Standing - Balance Support: Bilateral upper extremity supported;During functional activity Static Standing - Level of Assistance: 5: Stand by assistance   End of Session OT - End of Session Activity Tolerance: Patient tolerated treatment well Patient left: in chair;with call bell/phone within reach Nurse Communication: Mobility status;Precautions  GO     Veneda Melter 12/28/2012, 11:47 AM Pager: [phone]

## 2012-12-28 NOTE — Progress Notes (Addendum)
[facility] [place]  Progress Note Bypass Surgery  Date of Surgery: 12/27/2012  Procedure(s): BYPASS GRAFT FEMORAL-POPLITEAL ARTERY Surgeon: Surgeon(s): Serafina Mitchell, MD Angelia Mould, MD  1 Day Post-Op  History of Present Illness  Douglas Christensen is a 62 y.o. male who is S/P Procedure(s): BYPASS GRAFT FEMORAL-POPLITEAL ARTERY right.  The patient's pre-op symptoms of pain are Improved . Patients pain is well controlled.    VASC. LAB Studies:        pending   Imaging: No results found.  Significant Diagnostic Studies: CBC Lab Results  Component Value Date   WBC 11.9* 12/28/2012   HGB 12.9* 12/28/2012   HCT 37.6* 12/28/2012   MCV 101.6* 12/28/2012   PLT 172 12/28/2012    BMET     Component Value Date/Time   NA 137 12/28/2012 0440   K 3.9 12/28/2012 0440   CL 103 12/28/2012 0440   CO2 22 12/28/2012 0440   GLUCOSE 207* 12/28/2012 0440   GLUCOSE 101* 09/25/2006 1002   BUN 14 12/28/2012 0440   CREATININE 0.70 12/28/2012 0440   CALCIUM 8.5 12/28/2012 0440   GFRNONAA >90 12/28/2012 0440   GFRAA >90 12/28/2012 0440    COAG Lab Results  Component Value Date   INR 0.93 12/18/2012   INR 1.00 12/07/2012   INR 1.1* 11/07/2012   No results found for this basename: PTT    Physical Examination  BP Readings from Last 3 Encounters:  12/28/12 116/65  12/28/12 116/65  12/18/12 172/85   Temp Readings from Last 3 Encounters:  12/28/12 97.4 F (36.3 C) Oral  12/28/12 97.4 F (36.3 C) Oral  12/18/12 97.5 F (36.4 C)    SpO2 Readings from Last 3 Encounters:  12/28/12 99%  12/28/12 99%  12/18/12 97%   Pulse Readings from Last 3 Encounters:  12/28/12 87  12/28/12 87  12/18/12 57    Pt is A&O x 3 right lower extremity: Incision/s is/are clean,dry.intact, and  healing without hematoma, erythema or drainage Limb is warm; with good color  Doppler DP/PT  Assessment/Plan: Pt. Doing well Post-op pain is controlled Wounds are healing  well PT/OT for ambulation I discussed smoking and the effects on his vascular system.  He wants to try and stop.  I have order 42 mg nicotine patches. Transfer to [facility] [id number] 12/28/2012 7:34 AM   I agree with the above. The patient has been seen and examined. All of the incisions are healing nicely. There is a small amount of serous drainage from the distal incision. There is no erythema. The patient has a palpable dorsalis pedis pulse.  I again stressed the importance of smoking cessation. The patient wishes to be discharged home tomorrow.  [name]

## 2012-12-28 NOTE — Op Note (Signed)
[facility]  Patient name: Douglas Christensen MRN: [id number] DOB: 10-01-1951 Sex: male  12/27/2012 Pre-operative Diagnosis: Right leg claudication Post-operative diagnosis:  Same Surgeon:  Eldridge Abrahams Assistants:  Gae Gallop Procedure:   #1: Right common femoral endarterectomy. #2: Right femoral to above-knee popliteal artery bypass graft with non-reversed translocated ipsilateral greater saphenous vein Anesthesia:  Gen. Blood Loss:  See anesthesia record Specimens:  None  Findings:  The patient had inadequate vein ranging from 3-3-1/2 mm. There was extensive disease within the distal right external, common femoral and profunda femoral artery. An extensive endarterectomy was required. The proximal portion of the vein was used as a patch for the optimal anastomosis. The distal anastomosis was to the above-knee popliteal artery. The above-knee popliteal artery upon exposure was somewhat ectatic. When I opened it up there was chronic thrombus as if this were a small aneurysm. A Fogarty catheter was passed and all the thrombus was removed. It was a good distal target. A completion arteriogram was not performed because of the patient's low urine output and dark appearance of his urine. There was a good anterior tibial signal at the end of the case which was Doppler dependent  Indications:  The patient has significant lifestyle limiting claudication in both lower extremities. He has previously undergone stenting of the right leg which improved his symptoms. His stents have subsequently occluded and he is having lifestyle limiting symptoms. He is a candidate for femoral-popliteal bypass grafting.  Procedure:  The patient was identified in the holding area and taken to [place] [facility]  The patient was then placed supine on the table. general anesthesia was administered.  The patient was prepped and draped in the usual sterile fashion.  A time out was called and antibiotics  were administered.  Ultrasound was used to map the course of the saphenous vein in the leg. There was a large anterior branch that appeared to be of adequate diameter in addition to the saphenous vein. A longitudinal incision was made in the groin. The distal external iliac, common femoral, superficial femoral and profunda femoral artery were all individually isolated. There was nearly circumferential calcium throughout the entire exposed artery. I then proceeded with vein harvest. There was a large anterior branch which by ultrasound appeared to be adequate down to the knee. I elected initially to dissect out the anterior branch. When it was ultimately done, I did not feel that it had enough length to be the conduit for the bypass and therefore through the same skip incisions I harvested the main saphenous vein. Through the distal vein harvest incision, the above-knee popliteal artery was exposed. This was somewhat ectatic but appeared to be an adequate distal target. Next, a subsartorial tunnel was created. The patient was fully heparinized. A Cooley J. clamp was placed at the saphenofemoral junction. The vein was transected and the venotomy was closed with running 5-0 Prolene in 2 layers. The distal limb was ligated with a 2-0 silk tie. The vein was then prepared and placed in the back table. It distended to approximately 3-3-1/2 mm and was relatively uniform in caliber The distal external iliac, profunda, and superficial femoral arteries were occluded. A #11 blade was used to make an arteriotomy which was extended longitudinally with Potts scissors. There was extensive plaque within the common femoral and proximal profunda femoral artery. An elevator was used to remove the plaque. I was able to get a good distal endpoint in the profunda femoral  artery. I then passed a #5 Fogarty into the iliac system and used a hemostat to remove as much plaque as I could. There was excellent inflow after completion of this  maneuver. I then used the vein in a non-reversed fashion. The proximal end was spatulated to fit the size of the arteriotomy in the common femoral artery which was approximately 2-1/2 cm long. A running anastomosis was performed with 6-0 Prolene. Prior to completion, the appropriate flushing maneuvers were performed. The anastomosis was completed, the clamps were released. A valvulotome was passed multiple times to lyse the valves within the vein. Ultimately there was excellent pulsatile flow through the vein. The vein was brought through the previously created tunnel, making sure that it maintained its proper orientation. A tourniquet was then placed on the upper thigh. The leg was exsanguinated with an Esmarch. The tourniquet was inflated to 250 mm of pressure. I then used a #11 blade to make an arteriotomy in the above-knee popliteal artery. There was laminated mural thrombus for I opened the artery. The artery was somewhat ectatic in this area. I was easily able to remove all the thrombus. I passed a #4 Fogarty distally without resistance and without removing any debris. The vein was then cut to the appropriate length and spatulated to fit the size of the arteriotomy. A running anastomosis was created with 6-0 Prolene. Prior to completion the tourniquet was let down. Appropriate flushing was performed. The anastomosis was completed. The patient had Doppler dependent blood flow in the anterior tibial and posterior tibial artery at the ankle. The profunda femoral artery had a multiphasic signal. Protamine was administered. I elected not to see that arteriogram because the patient had had marginal urine output which was very amber in color it once hemostasis was achieved, the vein harvest incisions were closed with 30 and 4-0 Vicryl. The above-knee incision was closed with a layer of 2-0 Vicryl on the fascia and then a 30 and 4-0 Vicryl. The groin was closed by reapproximating the femoral sheath with 2-0 Vicryl.  Ultimately is a 3-0 Vicryl to close the subcutaneous tissue and 4 was used on the skin. Dermabond placed. The patient tolerated the procedure well there no complications.   Disposition:  To PACU in stable condition.   Theotis Burrow, M.D. [facility] [place] Office: [phone] Pager:  [phone]

## 2012-12-28 NOTE — Discharge Summary (Signed)
[facility]ists Discharge Summary   Patient ID:  Douglas Christensen MRN: [id number] DOB/AGE: 01-23-1951 62 y.o.  Admit date: 12/27/2012 Discharge date:12/30/2012 Date of Surgery: 12/27/2012 Surgeon: Surgeon(s): Serafina Mitchell, MD Angelia Mould, MD  Admission Diagnosis: PVD  Discharge Diagnoses:  PVD  Secondary Diagnoses: Past Medical History  Diagnosis Date  . History of colonic polyps   . COPD (chronic obstructive pulmonary disease)   . CAD (coronary artery disease)     Mild plaque (cath "years ago")  . GERD (gastroesophageal reflux disease)   . Hyperlipidemia   . Hypertension   . Vitamin D deficiency   . Meralgia paresthetica of left side 2011  . LBP (low back pain)   . PVD (peripheral vascular disease)     Stent to left common femoral and right superficial femoral.  2008.  50%  left renal   . Sleep apnea     mod OSA, central sleep apnea/hypoapnea syndrome 11/22/12    Procedure(s): BYPASS GRAFT FEMORAL-POPLITEAL ARTERY  Discharged Condition: good  HPI:  This is a very pleasant 62 year old gentleman that is referred to me by Dr. Burt Knack for evaluation of surgical treatment of peripheral vascular disease. He has recently undergone angiography which reveals an occluded right superficial femoral artery as well as a high-grade right common femoral stenosis. He has a history of right superficial femoral artery stenting in 2008. These have occluded. Also, one year ago he had stenting of his left iliac system. He currently has a high-grade left common femoral stenosis. The patient states that he can walk approximately 50-100 feet before he gets cramps. His cramps are relieved with rest. He denies ulceration or rest pain. He will occasionally get numbness in both feet.  The patient has a history of hypercholesterolemia. He is currently taking a statin. He has a 45 year history of tobacco abuse. He is medically managed for his hypertension. His blood pressure usually  runs in the 120s. The patient is also involved in the [place] study.    Hospital Course:  Douglas Christensen is a 62 y.o. male is S/P Right Procedure(s): BYPASS GRAFT FEMORAL-POPLITEAL ARTERY Extubated: POD # 0 Post-op wounds clean, dry, intact or healing well Pt. Ambulating, voiding and taking PO diet without difficulty. Pt pain controlled with PO pain meds. Labs as below Complications: minimal drainage from thigh wounds  Consults:     Significant Diagnostic Studies: CBC Lab Results  Component Value Date   WBC 11.9* 12/28/2012   HGB 12.9* 12/28/2012   HCT 37.6* 12/28/2012   MCV 101.6* 12/28/2012   PLT 172 12/28/2012    BMET    Component Value Date/Time   NA 137 12/28/2012 0440   K 3.9 12/28/2012 0440   CL 103 12/28/2012 0440   CO2 22 12/28/2012 0440   GLUCOSE 207* 12/28/2012 0440   GLUCOSE 101* 09/25/2006 1002   BUN 14 12/28/2012 0440   CREATININE 0.70 12/28/2012 0440   CALCIUM 8.5 12/28/2012 0440   GFRNONAA >90 12/28/2012 0440   GFRAA >90 12/28/2012 0440   COAG Lab Results  Component Value Date   INR 0.93 12/18/2012   INR 1.00 12/07/2012   INR 1.1* 11/07/2012     Disposition:  Discharge to :Home Discharge Orders    Future Appointments: Provider: Department: Dept Phone: Center:   02/01/2013 2:15 PM Cassandria Anger, MD [facility] [phone] [facility]     Future Orders Please Complete By Expires   Resume previous diet  Driving Restrictions      Comments:   No driving for 2 weeks   Lifting restrictions      Comments:   No lifting for 6 weeks   Call MD for:  temperature >100.5      Call MD for:  redness, tenderness, or signs of infection (pain, swelling, bleeding, redness, odor or green/yellow discharge around incision site)      Call MD for:  severe or increased pain, loss or decreased feeling  in affected limb(s)      Increase activity slowly      Comments:   Walk with assistance use walker or cane as needed   May shower           [place], [place]  Home Medication Instructions [id number]   Printed on:12/28/12 1449  Medication Information                    cholecalciferol (VITAMIN D) 1000 UNITS tablet Take 1,000 Units by mouth daily.             ranitidine (ZANTAC) 150 MG tablet Take 150 mg by mouth daily as needed. For heartburn            losartan-hydrochlorothiazide (HYZAAR) 100-25 MG per tablet Take 1 tablet by mouth daily.           atenolol (TENORMIN) 50 MG tablet Take 1 tablet (50 mg total) by mouth daily.           Fluticasone-Salmeterol (ADVAIR DISKUS) 100-50 MCG/DOSE AEPB Inhale 1 puff into the lungs 2 (two) times daily.           vitamin B-12 (CYANOCOBALAMIN) 1000 MCG tablet Take 1,000 mcg by mouth daily.           STUDY MEDICATION Take 1 tablet by mouth daily. "[place] Research Blind Study"           STUDY MEDICATION Take 1 tablet by mouth 2 (two) times daily. "[place] Research Blind Study"           traMADol (ULTRAM) 50 MG tablet Take 50-100 mg by mouth 2 (two) times daily as needed. For pain           sildenafil (VIAGRA) 100 MG tablet Take 100 mg by mouth daily as needed. For erectile dysfunction           oxyCODONE (OXY IR/ROXICODONE) 5 MG immediate release tablet Take 1-2 tablets (5-10 mg total) by mouth every 4 (four) hours as needed.            Verbal and written Discharge instructions given to the patient. Wound care per Discharge AVS  F/U Dr Trula Slade 2-3 weeks Signed: Laurence Slate [facility] 12/28/2012, 2:49 PM

## 2012-12-28 NOTE — Progress Notes (Signed)
Inpatient Diabetes Program Recommendations  AACE/ADA: New Consensus Statement on Inpatient Glycemic Control  Target Ranges:  Prepandial:   less than 140 mg/dL      Peak postprandial:   less than 180 mg/dL (1-2 hours)      Critically ill patients:  140 - 180 mg/dL  Pager:  [phone] Hours:  8 am-10pm   Reason for Visit: Elevated glucose: 207 mg/dL  Inpatient Diabetes Program Recommendations Correction (SSI): Re-check glucose, if remains elevated add Novolog correction HgbA1C: Check HgbA1C to assess glycemic control  Courtney Heys PhD, RN, BC-ADM Diabetes Coordinator  Office:  [phone] Team Pager:  [phone]

## 2012-12-28 NOTE — Evaluation (Signed)
Physical Therapy Evaluation Patient Details Name: Douglas Christensen MRN: [id number] DOB: 1951-02-17 Today's Date: 12/28/2012 Time: [id number] PT Time Calculation (min): 16 min  PT Assessment / Plan / Recommendation Clinical Impression  62 yo male admitted for #1: Right common femoral endarterectomy. #2: Right femoral to above-knee popliteal artery bypass graft with non-reversed translocated ipsilateral greater saphenous vein.  Pt will benefit from acute PT services to improve overall mobility, gait, stair training and DME education to prepare for safe d/c home.  Recommend pt use RW next session and perform stair negotiation.    PT Assessment  Patient needs continued PT services    Follow Up Recommendations  Home health PT;Supervision/Assistance - 24 hour    Barriers to Discharge None      Equipment Recommendations  Rolling walker with 5" wheels    Frequency Min 3X/week    Precautions / Restrictions Precautions Precautions: Fall   Pertinent Vitals/Pain C/o 8/10 right LE pain however tolerating well with ambulation      Mobility  Bed Mobility Bed Mobility: Not assessed Transfers Transfers: Sit to Stand;Stand to Sit Sit to Stand: 4: Min guard;With upper extremity assist;From chair/3-in-1 Stand to Sit: With upper extremity assist;To chair/3-in-1;4: Min guard Details for Transfer Assistance: next session to address RW safety with transfers. Ambulation/Gait Ambulation/Gait Assistance: 4: Min guard Ambulation Distance (Feet): 100 Feet Assistive device:  (used w/c ) Ambulation/Gait Assistance Details: minguard for safety with cues for step sequence.  Pt needs further education on DME needs next session. Gait Pattern: Step-to pattern;Decreased stance time - right;Shuffle;Trunk flexed Gait velocity: decreased Stairs: No     PT Diagnosis: Difficulty walking;Acute pain  PT Problem List: Decreased strength;Decreased range of motion;Decreased activity tolerance;Decreased balance;Decreased  mobility;Decreased knowledge of use of DME;Pain PT Treatment Interventions: DME instruction;Gait training;Stair training;Functional mobility training;Therapeutic activities;Therapeutic exercise;Patient/family education   PT Goals Acute Rehab PT Goals PT Goal Formulation: With patient Time For Goal Achievement: 01/04/13 Potential to Achieve Goals: Good Pt will go Supine/Side to Sit: with modified independence PT Goal: Supine/Side to Sit - Progress: Goal set today Pt will go Sit to Supine/Side: with modified independence PT Goal: Sit to Supine/Side - Progress: Goal set today Pt will go Sit to Stand: with modified independence PT Goal: Sit to Stand - Progress: Goal set today Pt will Transfer Bed to Chair/Chair to Bed: with modified independence PT Transfer Goal: Bed to Chair/Chair to Bed - Progress: Goal set today Pt will Ambulate: >150 feet;with modified independence;with rolling walker PT Goal: Ambulate - Progress: Goal set today Pt will Go Up / Down Stairs: 3-5 stairs;with modified independence;with least restrictive assistive device PT Goal: Up/Down Stairs - Progress: Goal set today  Visit Information  Last PT Received On: 12/28/12 Assistance Needed: +1 PT/OT Co-Evaluation/Treatment: Yes    Subjective Data  Subjective: "Its not as bad as I thought it was going to be." (pt re: walking) Patient Stated Goal: To go home   Prior Functioning  Home Living Lives With: Alone (girlfriend will stay temporarily ) Available Help at Discharge: Family;Friend(s) Type of Home: House Home Access: Stairs to enter [employer] of Steps: 4 Entrance Stairs-Rails: Right;Left;Can reach both Home Layout: One level Bathroom Shower/Tub: Product/process development scientist: Standard Bathroom Accessibility: Yes How Accessible: Accessible via walker Home Adaptive Equipment: Straight cane Prior Function Level of Independence: Independent Able to Take Stairs?: Yes Driving:  Yes 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[employer]) Communication Communication: No difficulties Dominant Hand: Right    Cognition  Overall Cognitive  Status: Appears within functional limits for tasks assessed/performed Arousal/Alertness: Awake/alert Orientation Level: Appears intact for tasks assessed Behavior During Session: [facility] for tasks performed    Extremity/Trunk Assessment Right Upper Extremity Assessment RUE ROM/Strength/Tone: Within functional levels RUE Sensation: WFL - Light Touch RUE Coordination: WFL - gross/fine motor Left Upper Extremity Assessment LUE ROM/Strength/Tone: Within functional levels LUE Sensation: WFL - Light Touch LUE Coordination: WFL - gross/fine motor Right Lower Extremity Assessment RLE ROM/Strength/Tone: Unable to fully assess;Due to pain (At least 3/5 ) Left Lower Extremity Assessment LLE ROM/Strength/Tone: Within functional levels Trunk Assessment Trunk Assessment: Normal   Balance Balance Balance Assessed: Yes Static Standing Balance Static Standing - Balance Support: Bilateral upper extremity supported;During functional activity Static Standing - Level of Assistance: 5: Stand by assistance  End of Session PT - End of Session Equipment Utilized During Treatment: Gait belt Activity Tolerance: Patient tolerated treatment well Patient left:  (w/c to transfer floor) Nurse Communication: Mobility status  GP     Bobie Kistler 12/28/2012, 12:33 PM Antoine Poche, PT DPT [phone]

## 2012-12-29 NOTE — Progress Notes (Addendum)
[facility] [place]  Progress Note Bypass Surgery  Date of Surgery: 12/27/2012  Procedure(s): Right BYPASS GRAFT FEMORAL-POPLITEAL ARTERY Surgeon: Surgeon(s): Serafina Mitchell, MD Angelia Mould, MD  2 Days Post-Op  History of Present Illness  Douglas Christensen is a 62 y.o. male who is S/P Procedure(s): BYPASS GRAFT FEMORAL-POPLITEAL ARTERY right.  The patient's pre-op symptoms are Improved . Patients pain is well controlled.    VASC. LAB Studies:        ABI: pending   Physical Examination  BP Readings from Last 3 Encounters:  12/29/12 135/76  12/29/12 135/76  12/18/12 172/85   Temp Readings from Last 3 Encounters:  12/29/12 98.1 F (36.7 C) Oral  12/29/12 98.1 F (36.7 C) Oral  12/18/12 97.5 F (36.4 C)    SpO2 Readings from Last 3 Encounters:  12/29/12 97%  12/29/12 97%  12/18/12 97%   Pulse Readings from Last 3 Encounters:  12/29/12 67  12/29/12 67  12/18/12 57    Pt is A&O x 3 right lower extremity: Incision/s is/are clean,dry.intact, and  healing without hematoma, erythema. Has minimal serous drainage from thigh wounds Limb is warm; with good color  Assessment/Plan: Pt. Doing well Post-op pain is controlled Wounds are healing well Continue wound care as ordered Home today  Douglas Christensen [id number] 12/29/2012 11:24 AM        I have examined the patient, reviewed and agree with above.  Dio Giller, MD 12/29/2012 12:16 PM

## 2012-12-29 NOTE — Progress Notes (Signed)
Pt ambulated 450 ft with no SOB or other complications/concerns. Pt back in bed and resting. Will continue to monitor.

## 2012-12-29 NOTE — Progress Notes (Signed)
Physical Therapy Treatment and D/C Patient Details Name: Douglas Christensen MRN: [id number] DOB: 04/30/1951 Today's Date: 12/29/2012 Time: [id number] PT Time Calculation (min): 10 min  PT Assessment / Plan / Recommendation Comments on Treatment Session  Pt s/p Right bypass graft and endarterectomy with decr mobility secondary to surgery.  Pt functioning well and all goals met at this time.  Should not need any f/u or equipment.      Follow Up Recommendations  No PT follow up                 Equipment Recommendations  None recommended by PT        Frequency     Plan Discharge plan needs to be updated    Precautions / Restrictions Precautions Precautions: None Restrictions Weight Bearing Restrictions: No   Pertinent Vitals/Pain VSS, No pain    Mobility  Bed Mobility Bed Mobility: Not assessed Transfers Transfers: Sit to Stand;Stand to Sit Sit to Stand: 7: Independent Stand to Sit: 7: Independent Ambulation/Gait Ambulation/Gait Assistance: 7: Independent Ambulation Distance (Feet): 300 Feet Assistive device: None Ambulation/Gait Assistance Details: Pt ambulating well with good cadence without device.  States his leg is sore but much better than yesterday. Gait Pattern: Step-through pattern;Decreased stride length;Trunk flexed Gait velocity: WFL Stairs: Yes Stairs Assistance: 5: Supervision Stairs Assistance Details (indicate cue type and reason): No assist needed Stair Management Technique: One rail Right;Forwards Number of Stairs: 5  Wheelchair Mobility Wheelchair Mobility: No    PT Goals Acute Rehab PT Goals PT Goal: Supine/Side to Sit - Progress: Met PT Goal: Sit to Supine/Side - Progress: Met PT Goal: Sit to Stand - Progress: Met PT Transfer Goal: Bed to Chair/Chair to Bed - Progress: Met PT Goal: Ambulate - Progress: Met PT Goal: Up/Down Stairs - Progress: Met  Visit Information  Last PT Received On: 12/29/12 Assistance Needed: +1    Subjective Data  Subjective: "I am leaving today."   Cognition  Overall Cognitive Status: Appears within functional limits for tasks assessed/performed Arousal/Alertness: Awake/alert Orientation Level: Appears intact for tasks assessed Behavior During Session: [facility] for tasks performed    Balance  Static Sitting Balance Static Sitting - Level of Assistance: Not tested (comment) Static Standing Balance Static Standing - Balance Support: No upper extremity supported;During functional activity Static Standing - Level of Assistance: 7: Independent High Level Balance High Level Balance Activites: Sudden stops;Turns;Direction changes High Level Balance Comments: Independent without device with above challenges in controlled environment.  End of Session PT - End of Session Equipment Utilized During Treatment: Gait belt Activity Tolerance: Patient tolerated treatment well Patient left: in chair;with call bell/phone within reach Nurse Communication: Mobility status        INGOLD,[name] 12/29/2012, 1:09 PM [facility] Acute Rehabilitation [phone] (pager)

## 2012-12-29 NOTE — Progress Notes (Signed)
Discharged to home with family office visits in place teaching done  

## 2012-12-31 ENCOUNTER — Telehealth: Payer: Self-pay | Admitting: Surgery

## 2012-12-31 ENCOUNTER — Other Ambulatory Visit: Payer: Self-pay | Admitting: *Deleted

## 2012-12-31 DIAGNOSIS — Z48812 Encounter for surgical aftercare following surgery on the circulatory system: Secondary | ICD-10-CM

## 2012-12-31 DIAGNOSIS — I739 Peripheral vascular disease, unspecified: Secondary | ICD-10-CM

## 2012-12-31 NOTE — Telephone Encounter (Addendum)
Message copied by Lujean Amel on Mon Dec 31, 2012 12:24 PM ------      Message from: Alfonso Patten      Created: Mon Dec 31, 2012  9:53 AM      Regarding: FW: 2 things - different pt.                   ----- Message -----         From: Richrd Prime, PA         Sent: 12/29/2012  11:28 AM           To: Alfonso Patten, RN      Subject: 2 things - different pt.                                  Dorthula Nettles 2-3 week F/U S/P fem -pop will need ABI's - [facility]            ______________________________________________________         I scheduled an appt for the above pt on 01/21/13 at 1pm for abi's and to see VWB at 1:30pm. I left a voicemail message for the pt and also mailed an appt letter.awt

## 2013-01-05 ENCOUNTER — Telehealth: Payer: Self-pay | Admitting: Vascular Surgery

## 2013-01-05 NOTE — Telephone Encounter (Signed)
This patient called about swelling in his right leg. He is approximately 1 week status post right femoropopliteal bypass grafting by Dr. Trula Slade. I encouraged him to elevate his leg and instructed him on the proper positioning for this. If the swelling persists she will call the office Monday and would need a venous duplex scan. Thank you.

## 2013-01-11 ENCOUNTER — Encounter: Payer: Self-pay | Admitting: Surgery

## 2013-01-14 ENCOUNTER — Ambulatory Visit (INDEPENDENT_AMBULATORY_CARE_PROVIDER_SITE_OTHER): Payer: 59 | Admitting: Vascular Surgery

## 2013-01-14 ENCOUNTER — Ambulatory Visit (INDEPENDENT_AMBULATORY_CARE_PROVIDER_SITE_OTHER): Payer: 59 | Admitting: Surgery

## 2013-01-14 ENCOUNTER — Encounter: Payer: Self-pay | Admitting: Surgery

## 2013-01-14 VITALS — BP 164/83 | HR 62 | Ht 65.0 in | Wt 173.0 lb

## 2013-01-14 DIAGNOSIS — R062 Wheezing: Secondary | ICD-10-CM

## 2013-01-14 DIAGNOSIS — I739 Peripheral vascular disease, unspecified: Secondary | ICD-10-CM

## 2013-01-14 DIAGNOSIS — I70219 Atherosclerosis of native arteries of extremities with intermittent claudication, unspecified extremity: Secondary | ICD-10-CM

## 2013-01-14 DIAGNOSIS — Z48812 Encounter for surgical aftercare following surgery on the circulatory system: Secondary | ICD-10-CM

## 2013-01-14 MED ORDER — ALBUTEROL SULFATE HFA 108 (90 BASE) MCG/ACT IN AERS: 2.0000 | INHALATION_SPRAY | Freq: Four times a day (QID) | RESPIRATORY_TRACT | Status: DC | PRN

## 2013-01-14 NOTE — Progress Notes (Signed)
Ankle brachial index performed @ [facility] 01/14/2013

## 2013-01-14 NOTE — Progress Notes (Signed)
The patient is back today for followup. On 11/28/2012 he underwent right femoral endarterectomy with right femoral to above-knee popliteal artery bypass graft with non-reversed translocated ipsilateral greater saphenous vein. His postoperative course was uncomplicated. He is back today for followup. He has quit smoking. He does complain of shortness of breath as well as right leg swelling. He still takes pain pills at night.  On examination he has 1-2+ edema in the right leg. His incisions have all healed. He has a palpable posterior tibial pulse.  Overall, I think he is doing very well status post bypass surgery. All his complaints are somewhat expected. I have recommended that he get compression stockings, 20-30 mm grade for his swelling. I think his swelling will subside over the next 3-6 months.  5 congratulated him on quitting smoking. He does complain of shortness of breath. He has Advair but this is difficult for him to afford. Given him a prescription for albuterol as a rescue inhaler. I've also made an appointment for him to see pulmonology.  He was given 30 Percocet today. He will come back in 3 months to see me with a duplex ultrasound

## 2013-01-17 ENCOUNTER — Institutional Professional Consult (permissible substitution): Payer: 59 | Admitting: Internal Medicine

## 2013-01-18 ENCOUNTER — Telehealth: Payer: Self-pay

## 2013-01-18 NOTE — Telephone Encounter (Signed)
Message copied by Denman George on Fri Jan 18, 2013 11:30 AM ------      Message from: Serafina Mitchell      Created: Thu Jan 17, 2013  8:34 PM      Regarding: RE: question about RTW       If he feels like he can go back to work, i would be ok withthat as long as he does not do any heavy lifting over 10 lbs for 6 weeks      ----- Message -----         From: Sherrye Payor, RN         Sent: 01/16/2013   3:58 PM           To: Serafina Mitchell, MD      Subject: question about RTW                                       Pt. is s/p right fem. Endart., and right fem-AK pop BP of 12/28/12.  He is asking about your recommendation to return to work.  He works for the [employer].  He is asking about going back to work with "light duty".  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  Should he wait at least 1 mo. Post-op?  His FMLA paperwork said he could rtn. In "6-8 weeks".  Any restrictions?         ------

## 2013-01-18 NOTE — Telephone Encounter (Signed)
Attempted to call pt. To inform of Dr. Stephens Shire instructions re: return to work.  Left msg. To call.

## 2013-01-21 ENCOUNTER — Ambulatory Visit: Payer: 59 | Admitting: Surgery

## 2013-01-22 NOTE — Telephone Encounter (Signed)
Contacted pt. and gave instructions per Dr. Trula Slade re: return to work; do not lift > 10 # for 6 weeks.   States he has "a golf ball sized knot on the top of middle incision of right leg."   States "it's not soft or firm", when questioned.  Denies any increase in redness or warmth at site of knot.  Denies any drainage.  Advised pt. will schedule appt. To check this area before he returns to work.  Appt. Given for 9:20 AM w/ nurse practitioner on 01/28/13.  Agrees w/ plan.

## 2013-01-25 ENCOUNTER — Encounter: Payer: Self-pay | Admitting: Neurosurgery

## 2013-01-28 ENCOUNTER — Ambulatory Visit (INDEPENDENT_AMBULATORY_CARE_PROVIDER_SITE_OTHER): Payer: 59 | Admitting: Neurosurgery

## 2013-01-28 ENCOUNTER — Encounter: Payer: Self-pay | Admitting: Neurosurgery

## 2013-01-28 VITALS — BP 122/75 | HR 67 | Temp 98.7°F | Resp 16 | Ht 65.0 in | Wt 172.0 lb

## 2013-01-28 DIAGNOSIS — I70219 Atherosclerosis of native arteries of extremities with intermittent claudication, unspecified extremity: Secondary | ICD-10-CM

## 2013-01-28 DIAGNOSIS — M79609 Pain in unspecified limb: Secondary | ICD-10-CM | POA: Insufficient documentation

## 2013-01-28 NOTE — Progress Notes (Signed)
Subjective:     Patient ID: Douglas Christensen, male   DOB: 09/18/1951, 62 y.o.   MRN: [id number]  HPI: 62 year old male patient of Dr. Trula Slade who underwent a right common femoral endarterectomy and right, femoral to above-the-knee popliteal bypass graft December 28 2012. The patient called office due to to a fluid collection in his femoral incision that does enlarge during the day. The patient states he does have some mild tenderness over this area. There is no redness or drainage. The patient does want to return to work.   Review of Systems: 12 point review of systems is notable for the difficulties described above otherwise unremarkable     Objective:   Physical Exam: Afebrile, vital signs are stable, both right lower extremity incisions are well-healed. Dr. Trula Slade spoke with the patient about a seroma and the lymphatic fluid collection. He explained to the patient he would prefer for this to resolve on its own and the patient agrees. The patient has no other complaints.     Assessment:     Lymphatic fluid collection in right medial thigh incision    Plan:     Dr. Trula Slade again explained to the patient that he would rather not take him to the operating room to fix this. The patient agrees and has asked for return to work note. Dr. Trula Slade to the patient he could return to work if he didn't have to do any lifting and the patient states he would be doing nothing but driving. The patient will return to work on 01/30/2013. The patient will followup with Dr. Trula Slade as scheduled in approximately 3 months. Dr. Trula Slade explained to the patient he should return to the office sooner if the area does not resolve or becomes red and or causes more pain. The patient's questions were encouraged and answered, he is in agreement with this plan.  Beatris Ship ANP  Clinic M.D.: Trula Slade

## 2013-01-31 ENCOUNTER — Ambulatory Visit (INDEPENDENT_AMBULATORY_CARE_PROVIDER_SITE_OTHER): Payer: 59 | Admitting: Internal Medicine

## 2013-01-31 ENCOUNTER — Encounter: Payer: Self-pay | Admitting: Internal Medicine

## 2013-01-31 VITALS — BP 124/76 | HR 65 | Temp 97.9°F | Ht 65.0 in | Wt 175.0 lb

## 2013-01-31 DIAGNOSIS — J449 Chronic obstructive pulmonary disease, unspecified: Secondary | ICD-10-CM

## 2013-01-31 DIAGNOSIS — R0600 Dyspnea, unspecified: Secondary | ICD-10-CM

## 2013-01-31 DIAGNOSIS — R0609 Other forms of dyspnea: Secondary | ICD-10-CM

## 2013-01-31 DIAGNOSIS — R0989 Other specified symptoms and signs involving the circulatory and respiratory systems: Secondary | ICD-10-CM

## 2013-01-31 MED ORDER — BUDESONIDE-FORMOTEROL FUMARATE 160-4.5 MCG/ACT IN AERO: INHALATION_SPRAY | RESPIRATORY_TRACT | Status: DC

## 2013-01-31 NOTE — Assessment & Plan Note (Signed)
Symptoms are markedly disproportionate to objective findings and not clear this is a lung problem but pt does appear to have difficult airway management issues. DDX of  difficult airways managment all start with A and  include Adherence, Ace Inhibitors, Acid Reflux, Active Sinus Disease, Alpha 1 Antitripsin deficiency, Anxiety masquerading as Airways dz,  ABPA,  allergy(esp in young), Aspiration (esp in elderly), Adverse effects of DPI,  Active smokers, plus two Bs  = Bronchiectasis and Beta blocker use..and one C= CHF  Adherence is always the initial "prime suspect" and is a multilayered concern that requires a "trust but verify" approach in every patient - starting with knowing how to use medications, especially inhalers, correctly, keeping up with refills and understanding the fundamental difference between maintenance and prns vs those medications only taken for a very short course and then stopped and not refilled. The proper method of use, as well as anticipated side effects, of a metered-dose inhaler are discussed and demonstrated to the patient. Improved effectiveness after extensive coaching during this visit to a level of approximately  75% so try symbicort 2bid   ? 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

## 2013-01-31 NOTE — Progress Notes (Signed)
Subjective:     Patient ID: Douglas Christensen, male   DOB: [birth year] MRN: [id number]  Shortness of Breath    61 yowm quit smoking 12/27/12 with onset of doe 2011 worse since fall 2013 referred to Pulmonary clinic by [facility] for sob  01/31/2013 1st pulmonary eval cc variable doe x sev years with exertion, now sob at rest 2-3 times per week  and last few minutes and take a few breaths, sit up straight and it goes away.  No better with albuterol or advair. Also doe bending over and with exertion "like wrestling with inmates"  - could not actually give any other specific examples of doe.  No obvious pattern to  daytime variabilty or assoc chronic cough or cp or chest tightness, subjective wheeze overt sinus or hb symptoms. No unusual exp hx or h/o childhood pna/ asthma or premature birth to his knowledge.   Sleeping ok without nocturnal  or early am exacerbation  of respiratory  c/o's or need for noct saba. Also denies any obvious fluctuation of symptoms with weather or environmental changes or other aggravating or alleviating factors except as outlined above.  ROS  The following are not active complaints unless bolded sore throat, dysphagia, dental problems, itching, sneezing,  nasal congestion or excess/ purulent secretions, ear ache,   fever, chills, sweats, unintended wt loss, pleuritic or exertional cp, hemoptysis,  orthopnea pnd or leg swelling, presyncope, palpitations, heartburn, abdominal pain, anorexia, nausea, vomiting, diarrhea  or change in bowel or urinary habits, change in stools or urine, dysuria,hematuria,  rash, arthralgias, visual complaints, headache, numbness weakness or ataxia or problems with walking or coordination,  change in mood/affect or memory.        Review of Systems  Respiratory: Positive for shortness of breath.        Objective:   Physical Exam   Wt Readings from Last 3 Encounters:  01/31/13 175 lb (79.379 kg)  01/28/13 172 lb (78.019 kg)  01/14/13 173 lb (78.472  kg)   HEENT mild turbinate edema.  Oropharynx no thrush or excess pnd or cobblestoning.  No JVD or cervical adenopathy. Mild accessory muscle hypertrophy. Trachea midline, nl thryroid. Chest was hyperinflated by percussion with diminished breath sounds and moderate increased exp time without wheeze. Hoover sign positive at mid inspiration. Regular rate and rhythm without murmur gallop or rub or increase P2 or edema.  Abd: no hsm, nl excursion. Ext warm without cyanosis or clubbing.    cxr 12/07/12 Moderate cardiomegaly. No active lung disease.     Assessment:           Plan:

## 2013-01-31 NOTE — Patient Instructions (Addendum)
symbicort 160 Take 2 puffs first thing in am and then another 2 puffs about 12 hours later only use when you're having  Breathing difficulty  Work on inhaler technique:  relax and gently blow all the way out then take a nice smooth deep breath back in, triggering the inhaler at same time you start breathing in.  Hold for up to 5 seconds if you can.  Rinse and gargle with water when done   If your mouth or throat starts to bother you,   I suggest you time the inhaler to your dental care and after using the inhaler(s) brush teeth and tongue with a baking soda containing toothpaste and when you rinse this out, gargle with it first to see if this helps your mouth and throat.    Please schedule a follow up office visit in 4 weeks, sooner if needed pfts

## 2013-02-01 ENCOUNTER — Ambulatory Visit: Payer: 59 | Admitting: Internal Medicine

## 2013-02-28 ENCOUNTER — Ambulatory Visit (INDEPENDENT_AMBULATORY_CARE_PROVIDER_SITE_OTHER): Payer: 59 | Admitting: Internal Medicine

## 2013-02-28 ENCOUNTER — Encounter: Payer: Self-pay | Admitting: Internal Medicine

## 2013-02-28 VITALS — BP 104/70 | HR 72 | Temp 97.6°F | Ht 63.0 in | Wt 174.0 lb

## 2013-02-28 DIAGNOSIS — R0609 Other forms of dyspnea: Secondary | ICD-10-CM

## 2013-02-28 DIAGNOSIS — R0989 Other specified symptoms and signs involving the circulatory and respiratory systems: Secondary | ICD-10-CM

## 2013-02-28 DIAGNOSIS — J449 Chronic obstructive pulmonary disease, unspecified: Secondary | ICD-10-CM

## 2013-02-28 DIAGNOSIS — R0600 Dyspnea, unspecified: Secondary | ICD-10-CM

## 2013-02-28 LAB — PULMONARY FUNCTION TEST

## 2013-02-28 MED ORDER — BUDESONIDE-FORMOTEROL FUMARATE 160-4.5 MCG/ACT IN AERO: INHALATION_SPRAY | RESPIRATORY_TRACT | Status: DC

## 2013-02-28 NOTE — Progress Notes (Signed)
Subjective:     Patient ID: Douglas Christensen, male   DOB: 1951-11-13 MRN: [id number]  Shortness of Breath    61 yowm quit smoking 12/27/12 with onset of doe 2011 worse since fall 2013 referred to Pulmonary clinic by [facility] for sob  01/31/2013 1st pulmonary eval cc variable doe x sev years with exertion, now sob at rest 2-3 times per week  and lasts a few minutes and take a few breaths, sit up straight and it goes away.  No better with albuterol or advair. Also doe bending over and with exertion "like wrestling with inmates"  - could not actually give any other specific examples of doe. rec symbicort 160 Take 2 puffs first thing in am and then another 2 puffs about 12 hours later only use when you're having  Breathing difficulty Work on inhaler technique:   02/28/2013 f/u ov/Shariq Puig  Chief Complaint  Patient presents with  . Follow-up    Pt states breathing is unchanged since last visit     No obvious pattern to  daytime variabilty or assoc chronic cough or cp or chest tightness, subjective wheeze overt sinus or hb symptoms. No unusual exp hx or h/o childhood pna/ asthma or premature birth to his knowledge.   Sleeping ok without nocturnal  or early am exacerbation  of respiratory  c/o's or need for noct saba. Also denies any obvious fluctuation of symptoms with weather or environmental changes or other aggravating or alleviating factors except as outlined above.  ROS  The following are not active complaints unless bolded sore throat, dysphagia, dental problems, itching, sneezing,  nasal congestion or excess/ purulent secretions, ear ache,   fever, chills, sweats, unintended wt loss, pleuritic or exertional cp, hemoptysis,  orthopnea pnd or leg swelling, presyncope, palpitations, heartburn, abdominal pain, anorexia, nausea, vomiting, diarrhea  or change in bowel or urinary habits, change in stools or urine, dysuria,hematuria,  rash, arthralgias, visual complaints, headache, numbness weakness or ataxia or  problems with walking or coordination,  change in mood/affect or memory.              Objective:   Physical Exam  02/28/2013   174  Wt Readings from Last 3 Encounters:  01/31/13 175 lb (79.379 kg)  01/28/13 172 lb (78.019 kg)  01/14/13 173 lb (78.472 kg)  HEENT: nl dentition, turbinates, and orophanx. Nl external ear canals without cough reflex   NECK :  without JVD/Nodes/TM/ nl carotid upstrokes bilaterally   LUNGS: no acc muscle use, clear to A and P bilaterally without cough on insp or exp maneuvers   CV:  RRR  no s3 or murmur or increase in P2, no edema   ABD:  soft and nontender with nl excursion in the supine position. No bruits or organomegaly, bowel sounds nl  MS:  warm without deformities, calf tenderness, cyanosis or clubbing  SKIN: warm and dry without lesions    NEURO:  alert, approp, no deficits    cxr 12/07/12 Moderate cardiomegaly. No active lung disease.     Assessment:           Plan:

## 2013-02-28 NOTE — Progress Notes (Signed)
PFT done today. 

## 2013-02-28 NOTE — Patient Instructions (Addendum)
Symbicort 160 Take 2 puffs first thing in am and then another 2 puffs about 12 hours later only use when you're having  Breathing difficulty  Work on inhaler technique:  relax and gently blow all the way out then take a nice smooth deep breath back in, triggering the inhaler at same time you start breathing in.  Hold for up to 5 seconds if you can.  Rinse and gargle with water when done   If your mouth or throat starts to bother you,   I suggest you time the inhaler to your dental care and after using the inhaler(s) brush teeth and tongue with a baking soda containing toothpaste and when you rinse this out, gargle with it first to see if this helps your mouth and throat.    You do not have bad lungs and you never will unless you start smoking again.   If you are satisfied with your treatment plan let your doctor know and he/she can either refill your medications or you can return here when your prescription runs out.     If in any way you are not 100% satisfied,  please tell us.  If 100% better, tell your friends!

## 2013-03-03 NOTE — Assessment & Plan Note (Addendum)
Despite poor baseline technique with spirometry his pft's are nl so he does not have copd but could still have asthma and I aslo worry about cardiac asthma or cardiac related sob.  Rec he use symbicort 160 2 puffs q 12 h prn and if continues to have sporadic dyspnea then consider cardiac w/u or cpst.  Chart review shows he is followed by Dr Alain Marion for CAD so will defer w/u to him

## 2013-03-04 ENCOUNTER — Telehealth: Payer: Self-pay | Admitting: Internal Medicine

## 2013-03-04 DIAGNOSIS — R0600 Dyspnea, unspecified: Secondary | ICD-10-CM

## 2013-03-04 NOTE — Telephone Encounter (Signed)
Will ref to Douglas Christensen per Dr Gustavus Bryant recomendation. Pls inform the pt Thx

## 2013-03-04 NOTE — Telephone Encounter (Signed)
Message copied by Cassandria Anger on Mon Mar 04, 2013  7:52 AM ------      Message from: Christinia Gully B      Created: Sun Mar 03, 2013  6:51 AM       Despite poor baseline technique with spirometry his pft's are nl so he does not have copd but could still have asthma and I aslo worry about cardiac asthma or cardiac related sob.            Rec he use symbicort 160 2 puffs q 12 h prn and if continues to have sporadic dyspnea then consider cardiac w/u or we could do a full cardiopulmonary stress test if needed [place] in our office schedules these)            Since you follow him (I don't see any cards in the list) will defer to you            Thanks! ------

## 2013-03-20 ENCOUNTER — Encounter (HOSPITAL_COMMUNITY): Payer: Self-pay | Admitting: *Deleted

## 2013-03-20 ENCOUNTER — Emergency Department (HOSPITAL_COMMUNITY)
Admission: EM | Admit: 2013-03-20 | Discharge: 2013-03-20 | Disposition: A | Discharge status: Home / Self Care | Payer: 59 | Attending: Emergency Medicine | Admitting: Emergency Medicine

## 2013-03-20 DIAGNOSIS — Z8669 Personal history of other diseases of the nervous system and sense organs: Secondary | ICD-10-CM | POA: Insufficient documentation

## 2013-03-20 DIAGNOSIS — E559 Vitamin D deficiency, unspecified: Secondary | ICD-10-CM | POA: Insufficient documentation

## 2013-03-20 DIAGNOSIS — Z9861 Coronary angioplasty status: Secondary | ICD-10-CM | POA: Insufficient documentation

## 2013-03-20 DIAGNOSIS — Z860100 Personal history of colon polyps, unspecified: Secondary | ICD-10-CM | POA: Insufficient documentation

## 2013-03-20 DIAGNOSIS — J449 Chronic obstructive pulmonary disease, unspecified: Secondary | ICD-10-CM | POA: Insufficient documentation

## 2013-03-20 DIAGNOSIS — K219 Gastro-esophageal reflux disease without esophagitis: Secondary | ICD-10-CM | POA: Insufficient documentation

## 2013-03-20 DIAGNOSIS — Z8679 Personal history of other diseases of the circulatory system: Secondary | ICD-10-CM | POA: Insufficient documentation

## 2013-03-20 DIAGNOSIS — I2510 Atherosclerotic heart disease of native coronary artery without angina pectoris: Secondary | ICD-10-CM | POA: Insufficient documentation

## 2013-03-20 DIAGNOSIS — Z79899 Other long term (current) drug therapy: Secondary | ICD-10-CM | POA: Insufficient documentation

## 2013-03-20 DIAGNOSIS — I10 Essential (primary) hypertension: Secondary | ICD-10-CM

## 2013-03-20 DIAGNOSIS — Z87891 Personal history of nicotine dependence: Secondary | ICD-10-CM | POA: Insufficient documentation

## 2013-03-20 DIAGNOSIS — E785 Hyperlipidemia, unspecified: Secondary | ICD-10-CM | POA: Insufficient documentation

## 2013-03-20 DIAGNOSIS — J4489 Other specified chronic obstructive pulmonary disease: Secondary | ICD-10-CM | POA: Insufficient documentation

## 2013-03-20 NOTE — ED Provider Notes (Signed)
History     CSN: [id number]  Arrival date & time 03/20/13  [id number]   First MD Initiated Contact with Patient 03/20/13 0920      Chief Complaint  Patient presents with  . Hypertension    (Consider location/radiation/quality/duration/timing/severity/associated sxs/prior treatment) Patient is a 62 y.o. male presenting with hypertension. The history is provided by the patient (pt states he ran out of his bp med.  3 days ago and his bp has been high.  pt states he has a prescription at the pharmacy waiting). No language interpreter was used.  Hypertension This is a chronic problem. The current episode started more than 1 week ago. The problem occurs constantly. The problem has not changed since onset.Pertinent negatives include no chest pain, no abdominal pain and no headaches. Nothing aggravates the symptoms. Nothing relieves the symptoms.    Past Medical History  Diagnosis Date  . History of colonic polyps   . COPD (chronic obstructive pulmonary disease)   . CAD (coronary artery disease)     Mild plaque (cath "years ago")  . GERD (gastroesophageal reflux disease)   . Hyperlipidemia   . Hypertension   . Vitamin D deficiency   . Meralgia paresthetica of left side 2011  . LBP (low back pain)   . PVD (peripheral vascular disease)     Stent to left common femoral and right superficial femoral.  2008.  50%  left renal   . Sleep apnea     mod OSA, central sleep apnea/hypoapnea syndrome 11/22/12    Past Surgical History  Procedure Laterality Date  . None    . Lower extremity stents      bilateral lower extremities x 2  . Cardiac catheterization    . Femoral-popliteal bypass graft  12/27/2012    Procedure: BYPASS GRAFT FEMORAL-POPLITEAL ARTERY;  Surgeon: Serafina Mitchell, MD;  Location: [facility] OR;  Service: Vascular;  Laterality: Right;  using non-reversed sapphenous vein.    Family History  Problem Relation Age of Onset  . Heart disease Mother     No clear CAD  . Hypertension Mother    . Cancer Mother     ? colon ca  . Hyperlipidemia Mother   . Cancer Father     brain tumor  . Alzheimer's disease Father   . Colon cancer Neg Hx     History  Substance Use Topics  . Smoking status: Former Smoker -- 2.00 packs/day for 47 years    Types: Cigarettes    Quit date: 12/27/2012  . Smokeless tobacco: Never Used  . Alcohol Use: 7.2 oz/week    2 Cans of beer, 10 Shots of liquor per week      Review of Systems  Constitutional: Negative for appetite change and fatigue.  HENT: Negative for congestion, sinus pressure and ear discharge.   Eyes: Negative for discharge.  Respiratory: Negative for cough.   Cardiovascular: Negative for chest pain.  Gastrointestinal: Negative for abdominal pain and diarrhea.  Genitourinary: Negative for frequency and hematuria.  Musculoskeletal: Negative for back pain.  Skin: Negative for rash.  Neurological: Negative for seizures and headaches.  Psychiatric/Behavioral: Negative for hallucinations.    Allergies  Benazepril  Home Medications   Current Outpatient Rx  Name  Route  Sig  Dispense  Refill  . albuterol (PROVENTIL HFA;VENTOLIN HFA) 108 (90 BASE) MCG/ACT inhaler   Inhalation   Inhale 2 puffs into the lungs every 6 (six) hours as needed for wheezing.   1 Inhaler   2   .  atenolol (TENORMIN) 50 MG tablet   Oral   Take 1 tablet (50 mg total) by mouth daily.   90 tablet   1     Due for yearly follow-up in September no additiona ...   . atorvastatin (LIPITOR) 20 MG tablet   Oral   Take 1 tablet by mouth daily.         . budesonide-formoterol (SYMBICORT) 160-4.5 MCG/ACT inhaler      Take 2 puffs first thing in am and then another 2 puffs about 12 hours later.   1 Inhaler   11   . cholecalciferol (VITAMIN D) 1000 UNITS tablet   Oral   Take 1,000 Units by mouth daily.           Marland Kitchen losartan-hydrochlorothiazide (HYZAAR) 100-25 MG per tablet   Oral   Take 1 tablet by mouth daily.   90 tablet   3   . ranitidine  (ZANTAC) 150 MG tablet   Oral   Take 150 mg by mouth daily as needed. For heartburn          . sildenafil (VIAGRA) 100 MG tablet   Oral   Take 100 mg by mouth daily as needed. For erectile dysfunction         . STUDY MEDICATION   Oral   Take 1 tablet by mouth daily. "[employer]         . STUDY MEDICATION   Oral   Take 1 tablet by mouth 2 (two) times daily. "[employer]         . traMADol (ULTRAM) 50 MG tablet   Oral   Take 50-100 mg by mouth 2 (two) times daily as needed. For pain         . vitamin B-12 (CYANOCOBALAMIN) 1000 MCG tablet   Oral   Take 1,000 mcg by mouth daily.           BP 183/106  Pulse 65  Temp(Src) 97.6 F (36.4 C) (Oral)  Resp 18  SpO2 97%  Physical Exam  Constitutional: He is oriented to person, place, and time. He appears well-developed.  HENT:  Head: Normocephalic.  Eyes: Conjunctivae and EOM are normal. No scleral icterus.  Neck: Neck supple. No thyromegaly present.  Cardiovascular: Normal rate and regular rhythm.  Exam reveals no gallop and no friction rub.   No murmur heard. Pulmonary/Chest: No stridor. He has no wheezes. He has no rales. He exhibits no tenderness.  Abdominal: He exhibits no distension. There is no tenderness. There is no rebound.  Musculoskeletal: Normal range of motion. He exhibits no edema.  Lymphadenopathy:    He has no cervical adenopathy.  Neurological: He is oriented to person, place, and time. Coordination normal.  Skin: No rash noted. No erythema.  Psychiatric: He has a normal mood and affect. His behavior is normal.    ED Course  Procedures (including critical care time)  Labs Reviewed - No data to display No results found.   1. Hypertension       MDM          Maudry Diego, MD 03/20/13 [phone]

## 2013-03-20 NOTE — ED Notes (Signed)
Pt. Denies any pain or discomfort, alert and oriented X4.  Will continue to monitor.

## 2013-03-20 NOTE — ED Notes (Signed)
Pt reports hypertension since Friday. States out of medication last week, but began back on Atenelol on Friday. States BP in 190's this am and he felt dizzy. Denies pain. No blurred vision.

## 2013-04-08 ENCOUNTER — Ambulatory Visit (INDEPENDENT_AMBULATORY_CARE_PROVIDER_SITE_OTHER): Payer: 59 | Admitting: Cardiovascular Disease

## 2013-04-08 ENCOUNTER — Encounter: Payer: Self-pay | Admitting: Cardiovascular Disease

## 2013-04-08 VITALS — BP 120/64 | HR 58 | Ht 65.0 in | Wt 170.0 lb

## 2013-04-08 DIAGNOSIS — R0602 Shortness of breath: Secondary | ICD-10-CM

## 2013-04-08 NOTE — Patient Instructions (Addendum)
Your physician has requested that you have an exercise stress myoview. For further information please visit [email or web address]. Please follow instruction sheet, as given.  Your physician wants you to follow-up in: 1 YEAR with Dr Burt Knack. You will receive a reminder letter in the mail two months in advance. If you don't receive a letter, please call our office to schedule the follow-up appointment.  Your physician recommends that you continue on your current medications as directed. Please refer to the Current Medication list given to you today.

## 2013-04-08 NOTE — Progress Notes (Signed)
HPI:  62 year old gentleman presenting for followup evaluation. The patient is followed for lower extremity peripheral arterial disease. He underwent right SFA stenting  and left common iliac stenting in 2008. He also underwent left external iliac stenting in 2012. He developed severe intermittent claudication of the right leg and ultimately required right femoral endarterectomy and femoropopliteal bypass.  The patient is able to do much more since surgery. His right leg feels much better. He complains of some numbness and discomfort in the left upper leg, but no radiation to the calf or foot. He complains of shortness of breath with activity. He denies chest pain or pressure. Yesterday he had an episode of dizziness and diaphoresis. He has quit smoking since January. He's using vaporize cigarettes and now is down to a point where there is no nicotine.  Outpatient Encounter Prescriptions as of 04/08/2013  Medication Sig Dispense Refill  . atenolol (TENORMIN) 50 MG tablet Take 1 tablet (50 mg total) by mouth daily.  90 tablet  1  . atorvastatin (LIPITOR) 20 MG tablet Take 1 tablet by mouth daily.      . budesonide-formoterol (SYMBICORT) 160-4.5 MCG/ACT inhaler Take 2 puffs first thing in am and then another 2 puffs about 12 hours later.  1 Inhaler  11  . cholecalciferol (VITAMIN D) 1000 UNITS tablet Take 1,000 Units by mouth daily.        Marland Kitchen losartan-hydrochlorothiazide (HYZAAR) 100-25 MG per tablet Take 1 tablet by mouth daily.  90 tablet  3  . ranitidine (ZANTAC) 150 MG tablet Take 150 mg by mouth daily as needed. For heartburn       . sildenafil (VIAGRA) 100 MG tablet Take 100 mg by mouth daily as needed. For erectile dysfunction      . STUDY MEDICATION Take 1 tablet by mouth daily. "[employer]      . STUDY MEDICATION Take 1 tablet by mouth 2 (two) times daily. "[employer]      . traMADol (ULTRAM) 50 MG tablet Take 50-100 mg by mouth 2 (two) times daily as  needed. For pain      . vitamin B-12 (CYANOCOBALAMIN) 1000 MCG tablet Take 1,000 mcg by mouth daily.      . [DISCONTINUED] albuterol (PROVENTIL HFA;VENTOLIN HFA) 108 (90 BASE) MCG/ACT inhaler Inhale 2 puffs into the lungs every 6 (six) hours as needed for wheezing.  1 Inhaler  2   No facility-administered encounter medications on file as of 04/08/2013.    Allergies  Allergen Reactions  . Benazepril     cough    Past Medical History  Diagnosis Date  . History of colonic polyps   . COPD (chronic obstructive pulmonary disease)   . CAD (coronary artery disease)     Mild plaque (cath "years ago")  . GERD (gastroesophageal reflux disease)   . Hyperlipidemia   . Hypertension   . Vitamin D deficiency   . Meralgia paresthetica of left side 2011  . LBP (low back pain)   . PVD (peripheral vascular disease)     Stent to left common femoral and right superficial femoral.  2008.  50%  left renal   . Sleep apnea     mod OSA, central sleep apnea/hypoapnea syndrome 11/22/12    ROS: Negative except as per HPI  BP 120/64  Pulse 58  Ht 5\' 5"  (1.651 m)  Wt 77.111 kg (170 lb)  BMI 28.29 kg/m2  SpO2 98%  PHYSICAL EXAM: Pt is alert and oriented,  NAD HEENT: normal Neck: JVP - normal, carotids 2+= without bruits Lungs: CTA bilaterally CV: RRR without murmur or gallop Abd: soft, NT, Positive BS, no hepatomegaly Ext: no C/C/E Skin: warm/dry no rash  ASSESSMENT AND PLAN: 1. Shortness of breath. Unclear etiology. He was evaluated by pulmonary and he does not have COPD. He is a long-standing smoker and this is the most likely etiology, but unusual that his PFTs are normal. Considering his known peripheral arterial disease, will check an exercise Myoview scan to rule out an ischemic equivalent. He is at high risk for coronary disease.  2. The peripheral arterial disease. He appears stable. I'm not sure that his left leg symptoms are related to occlusive arterial disease. He has followup next week  with Dr. Trula Slade and arterial studies are scheduled for that visit.  3. Hyperlipidemia. The patient is on atorvastatin. Lipids from October 2013 showed cholesterol of 236, LDL 160, HDL 37. I think he was off of his statin drug at that time.  4. Hypertension. Blood pressure is well controlled on losartan, atenolol, and hydrochlorothiazide.  Will review his stress test when available. Otherwise will see him back next year. He is also followed through the [facility] protocol.  Sherren Mocha 04/08/2013 12:50 PM

## 2013-04-12 ENCOUNTER — Encounter: Payer: Self-pay | Admitting: Surgery

## 2013-04-15 ENCOUNTER — Other Ambulatory Visit: Payer: Self-pay | Admitting: *Deleted

## 2013-04-15 ENCOUNTER — Ambulatory Visit (INDEPENDENT_AMBULATORY_CARE_PROVIDER_SITE_OTHER): Payer: 59 | Admitting: Surgery

## 2013-04-15 ENCOUNTER — Encounter: Payer: Self-pay | Admitting: Surgery

## 2013-04-15 ENCOUNTER — Encounter (INDEPENDENT_AMBULATORY_CARE_PROVIDER_SITE_OTHER): Payer: 59 | Admitting: *Deleted

## 2013-04-15 VITALS — BP 121/68 | HR 61 | Ht 65.0 in | Wt 177.0 lb

## 2013-04-15 DIAGNOSIS — Z48812 Encounter for surgical aftercare following surgery on the circulatory system: Secondary | ICD-10-CM

## 2013-04-15 DIAGNOSIS — I739 Peripheral vascular disease, unspecified: Secondary | ICD-10-CM

## 2013-04-15 NOTE — Progress Notes (Signed)
[facility] [place]  Postoperative Visit Bypass Surgery Date of Surgery: 12/28/12 Surgeon: Eudelia Bunch, MD  History of Present Illness  Douglas Christensen is a 62 y.o. male who presents for postoperative follow-up for: right fem-pop bypass with GSV surgery.  The patient's wounds are healed.   Patients pain is well controlled.  The patient's pre-operative symptoms of claudication are Improved. There is no more fullness in vein harvest sight  VASC. LAB Studies: 04/15/2013        ABI: Right 1.01;  Left 0.74 (0.85 on 01/14/13);        Bypass is open  Physical Examination  Filed Vitals:   04/15/13 1602  BP: 121/68  Pulse: 61    Pt is A&O x 3 Gait is normal right lower extremity: Incision/s is/are clean,dry.intact, and  healed. RLE is without  Edema, with no erythema; with no hematoma  Dorsalis Pedis pulse is palpable Posterior tibial pulse is  palpable    Medical Decision Making  Douglas Christensen is a 62 y.o. year old male who presents s/p right lower extremity bypass surgery with palpable pulses and ABI of 1.01 . He denies symptoms of claudication in left leg - angiogram showed non-occlusive narrowing in left CFA with intact circulation from SFA/profunda with 3 vessel runoff. We will reassess left leg with duplex in 6 months Pt will call if he has symptoms in his left leg   The patient's right bypass incisions are healed with good resolution of pre-operative symptoms. The patient's surveillance will include ABI and bypass duplex studies which will be  in: 6 months.    Clinic MD: Eudelia Bunch, MD   The patient has been seen and evaluated. His bypass graft is widely patent as evidence by physical examination and duplex ultrasound. He is now back to work and doing very well. He is somewhat concerned about his left leg. Angiographically he has a high-grade left common femoral artery stenosis. The patient remains asymptomatic. A total address his left leg in 6 months. If it  does cause him problems I would consider left femoral endarterectomy. If he remains asymptomatic, I recommend close observation  Douglas Christensen

## 2013-04-16 NOTE — Addendum Note (Signed)
Addended by: Mena Goes on: 04/16/2013 09:15 AM   Modules accepted: Orders

## 2013-04-18 ENCOUNTER — Ambulatory Visit (HOSPITAL_COMMUNITY): Payer: 59 | Attending: Cardiology | Admitting: Radiology

## 2013-04-18 VITALS — BP 119/67 | HR 77 | Ht 65.0 in | Wt 172.0 lb

## 2013-04-18 DIAGNOSIS — R0602 Shortness of breath: Secondary | ICD-10-CM | POA: Insufficient documentation

## 2013-04-18 DIAGNOSIS — R110 Nausea: Secondary | ICD-10-CM | POA: Insufficient documentation

## 2013-04-18 DIAGNOSIS — R5381 Other malaise: Secondary | ICD-10-CM | POA: Insufficient documentation

## 2013-04-18 DIAGNOSIS — I739 Peripheral vascular disease, unspecified: Secondary | ICD-10-CM | POA: Insufficient documentation

## 2013-04-18 DIAGNOSIS — I10 Essential (primary) hypertension: Secondary | ICD-10-CM | POA: Insufficient documentation

## 2013-04-18 DIAGNOSIS — R61 Generalized hyperhidrosis: Secondary | ICD-10-CM | POA: Insufficient documentation

## 2013-04-18 DIAGNOSIS — Z8249 Family history of ischemic heart disease and other diseases of the circulatory system: Secondary | ICD-10-CM | POA: Insufficient documentation

## 2013-04-18 DIAGNOSIS — E785 Hyperlipidemia, unspecified: Secondary | ICD-10-CM | POA: Insufficient documentation

## 2013-04-18 DIAGNOSIS — R42 Dizziness and giddiness: Secondary | ICD-10-CM | POA: Insufficient documentation

## 2013-04-18 DIAGNOSIS — R5383 Other fatigue: Secondary | ICD-10-CM | POA: Insufficient documentation

## 2013-04-18 DIAGNOSIS — R0609 Other forms of dyspnea: Secondary | ICD-10-CM | POA: Insufficient documentation

## 2013-04-18 DIAGNOSIS — Z87891 Personal history of nicotine dependence: Secondary | ICD-10-CM | POA: Insufficient documentation

## 2013-04-18 DIAGNOSIS — I779 Disorder of arteries and arterioles, unspecified: Secondary | ICD-10-CM | POA: Insufficient documentation

## 2013-04-18 DIAGNOSIS — J45909 Unspecified asthma, uncomplicated: Secondary | ICD-10-CM | POA: Insufficient documentation

## 2013-04-18 DIAGNOSIS — R0989 Other specified symptoms and signs involving the circulatory and respiratory systems: Secondary | ICD-10-CM | POA: Insufficient documentation

## 2013-04-18 DIAGNOSIS — I4949 Other premature depolarization: Secondary | ICD-10-CM

## 2013-04-18 MED ORDER — TECHNETIUM TC 99M SESTAMIBI GENERIC - CARDIOLITE
11.0000 | Freq: Once | INTRAVENOUS | Status: AC | PRN
  Administered 2013-04-18: 11 via INTRAVENOUS

## 2013-04-18 MED ORDER — TECHNETIUM TC 99M SESTAMIBI GENERIC - CARDIOLITE
33.0000 | Freq: Once | INTRAVENOUS | Status: AC | PRN
  Administered 2013-04-18: 33 via INTRAVENOUS

## 2013-04-18 MED ORDER — REGADENOSON 0.4 MG/5ML IV SOLN
0.4000 mg | Freq: Once | INTRAVENOUS | Status: AC
  Administered 2013-04-18: 0.4 mg via INTRAVENOUS

## 2013-04-18 NOTE — Progress Notes (Signed)
[facility] SITE [address] NUCLEAR MED [address] [place], [place] [address] [phone]    Cardiology Nuclear Med Study  Douglas Christensen is a 62 y.o. male     MRN : [id number]     DOB: May 06, 1951  Procedure Date: 04/18/2013  Nuclear Med Background Indication for Stress Test:  Evaluation for Ischemia History:  Asthmatic Bronchitis and (?) COPD Cardiac Risk Factors: Carotid Disease, Claudication, Family History - CAD, History of Smoking, Hypertension, Lipids and PVD  Symptoms:  Diaphoresis, Dizziness, DOE, Fatigue, Nausea and SOB   Nuclear Pre-Procedure Caffeine/Decaff Intake:  None NPO After: 6:30am   Lungs:  clear O2 Sat: 95% on room air. IV 0.9% NS with Angio Cath:  20g  IV Site: R Antecubital  IV Started by:  Eliezer Lofts, EMT-P  Chest Size (in):  42 Cup Size: n/a  Height: 5\' 5"  (1.651 m)  Weight:  172 lb (78.019 kg)  BMI:  Body mass index is 28.62 kg/(m^2). Tech Comments:  Atenolol held > 24 hours, per patient.    Nuclear Med Study 1 or 2 day study: 1 day  Stress Test Type:  Treadmill/Lexiscan  Reading MD: Dorris Carnes, MD  Order Authorizing Provider:  Sherren Mocha, MD  Resting Radionuclide: Technetium 8m Sestamibi  Resting Radionuclide Dose: 11.0 mCi   Stress Radionuclide:  Technetium 62m Sestamibi  Stress Radionuclide Dose: 33.0 mCi           Stress Protocol Rest HR: 77 Stress HR: 117  Rest BP: 119/67 Stress BP: 201/79  Exercise Time (min): 4:45, then decreased treadmill to 1 mph and 2% grade. Pt then administered Lexiscan. METS: 6.2   Predicted Max HR: 158 bpm % Max HR: 74.05 bpm Rate Pressure Product: 23517   Dose of Adenosine (mg):  n/a Dose of Lexiscan: 0.4 mg  Dose of Atropine (mg): n/a Dose of Dobutamine: n/a mcg/kg/min (at max HR)  Stress Test Technologist: Eliezer Lofts, EMT-P  Nuclear Technologist:  Charlton Amor, CNMT     Rest Procedure:  Myocardial perfusion imaging was performed at rest 45 minutes following the intravenous administration of  Technetium 18m Sestamibi. Rest ECG:  NSR  77 bpm    Stress Procedure:  The patient exercised on the treadmill utilizing the Bruce Protocol for 4:45 minutes. The patient stopped due to DOE and bilateral leg pain and denied any chest pain prior to reaching target HR. Due to Pt's RPE= 17, the treadmill was reduced to 1 mph and 2% grade. The patient received IV Lexiscan 0.4 mg over 15-seconds with concurrent low level exercise and then Technetium 30m Sestamibi was injected at 30-seconds while the patient continued walking one more minute.  Quantitative spect images were obtained after a 45-minute delay. Stress ECG: 1 mm ST depression in I, AVL in recovery perior.    QPS Raw Data Images: Extensive motion.  Soft tissue (diaphragm, bowel activity, subcutaneous fat) surround heart. Stress Images:  Moderate defect in the inferior  (base,mid, distal) and inferolateral (minimally base, mid) Rest Images:  Incomplete normalization of perfusion. Subtraction (SDS):  Ischemia Transient Ischemic Dilatation (Normal <1.22):  1.52 Lung/Heart Ratio (Normal <0.45):  0.49  Quantitative Gated Spect Images QGS EDV:  n/a ml QGS ESV:  n/a ml  Impression Exercise Capacity:  Lexiscan with low level exercise. BP Response:  Normal blood pressure response. Clinical Symptoms:  No chest pain. ECG Impression:  Significant ST abnormalities consistent with ischemia. Comparison with Prior Nuclear Study: Previous scan was normal  Inferior/inferolateral changes are new.  Overall Impression: Scan limited by motion.  Moderate region of inferior/inferolateral ischemia and scar described above.  LV Ejection Fraction: Study not gated.  Dorris Carnes

## 2013-04-19 ENCOUNTER — Telehealth: Payer: Self-pay | Admitting: Cardiovascular Disease

## 2013-04-25 ENCOUNTER — Other Ambulatory Visit: Payer: Self-pay

## 2013-04-25 DIAGNOSIS — R9439 Abnormal result of other cardiovascular function study: Secondary | ICD-10-CM

## 2013-04-25 DIAGNOSIS — R0602 Shortness of breath: Secondary | ICD-10-CM

## 2013-04-29 ENCOUNTER — Other Ambulatory Visit (INDEPENDENT_AMBULATORY_CARE_PROVIDER_SITE_OTHER): Payer: 59

## 2013-04-29 DIAGNOSIS — R9439 Abnormal result of other cardiovascular function study: Secondary | ICD-10-CM

## 2013-04-29 DIAGNOSIS — R0602 Shortness of breath: Secondary | ICD-10-CM

## 2013-04-29 LAB — PROTIME-INR
INR: 1 ratio (ref 0.8–1.0)
Prothrombin Time: 10.9 s (ref 10.2–12.4)

## 2013-04-29 LAB — CBC WITH DIFFERENTIAL/PLATELET
Basophils Absolute: 0.1 10*3/uL (ref 0.0–0.1)
Eosinophils Absolute: 0.2 10*3/uL (ref 0.0–0.7)
Eosinophils Relative: 2 % (ref 0.0–5.0)
MCHC: 34.3 g/dL (ref 30.0–36.0)
MCV: 97.2 fl (ref 78.0–100.0)
Monocytes Absolute: 0.7 10*3/uL (ref 0.1–1.0)
Neutrophils Relative %: 67 % (ref 43.0–77.0)
Platelets: 244 10*3/uL (ref 150.0–400.0)
WBC: 9.3 10*3/uL (ref 4.5–10.5)

## 2013-04-29 LAB — BASIC METABOLIC PANEL
BUN: 18 mg/dL (ref 6–23)
Chloride: 109 mEq/L (ref 96–112)
Creatinine, Ser: 1.4 mg/dL (ref 0.4–1.5)

## 2013-05-02 ENCOUNTER — Encounter (HOSPITAL_BASED_OUTPATIENT_CLINIC_OR_DEPARTMENT_OTHER)
Admission: RE | Disposition: A | Discharge status: Home / Self Care | Payer: Self-pay | Source: Ambulatory Visit | Attending: Cardiovascular Disease

## 2013-05-02 ENCOUNTER — Inpatient Hospital Stay (HOSPITAL_BASED_OUTPATIENT_CLINIC_OR_DEPARTMENT_OTHER)
Admission: RE | Admit: 2013-05-02 | Discharge: 2013-05-02 | Disposition: A | Discharge status: Home / Self Care | Payer: 59 | Source: Ambulatory Visit | Attending: Cardiovascular Disease | Admitting: Cardiovascular Disease

## 2013-05-02 DIAGNOSIS — I2510 Atherosclerotic heart disease of native coronary artery without angina pectoris: Secondary | ICD-10-CM

## 2013-05-02 HISTORY — PX: CARDIAC CATHETERIZATION: SHX172

## 2013-05-02 SURGERY — JV LEFT HEART CATHETERIZATION WITH CORONARY ANGIOGRAM

## 2013-05-02 MED ORDER — DIAZEPAM 5 MG PO TABS
5.0000 mg | ORAL_TABLET | ORAL | Status: AC
  Administered 2013-05-02: 5 mg via ORAL

## 2013-05-02 MED ORDER — SODIUM CHLORIDE 0.9 % IV SOLN: 250.0000 mL | INTRAVENOUS | Status: DC | PRN

## 2013-05-02 MED ORDER — SODIUM CHLORIDE 0.9 % IJ SOLN: 3.0000 mL | Freq: Two times a day (BID) | INTRAMUSCULAR | Status: DC

## 2013-05-02 MED ORDER — SODIUM CHLORIDE 0.9 % IJ SOLN: 3.0000 mL | INTRAMUSCULAR | Status: DC | PRN

## 2013-05-02 MED ORDER — ACETAMINOPHEN 325 MG PO TABS: 650.0000 mg | ORAL_TABLET | ORAL | Status: DC | PRN

## 2013-05-02 MED ORDER — SODIUM CHLORIDE 0.9 % IV SOLN: 1.0000 mL/kg/h | INTRAVENOUS | Status: DC

## 2013-05-02 MED ORDER — ONDANSETRON HCL 4 MG/2ML IJ SOLN: 4.0000 mg | Freq: Four times a day (QID) | INTRAMUSCULAR | Status: DC | PRN

## 2013-05-02 MED ORDER — SODIUM CHLORIDE 0.9 % IV SOLN: INTRAVENOUS | Status: DC

## 2013-05-02 NOTE — OR Nursing (Signed)
Dr Cooper at bedside to discuss results and treatment plan with pt and family 

## 2013-05-02 NOTE — CV Procedure (Signed)
   Cardiac Catheterization Procedure Note  Name: Douglas Christensen MRN: [id number] DOB: 1951-04-17  Procedure: Left Heart Cath, Selective Coronary Angiography, LV angiography  Indication: Abnormal Myoview, moderate risk study with inferior scar and ischemia, progressive exertional dyspnea.   Procedural Details: The right wrist was prepped, draped, and anesthetized with 1% lidocaine. Using the modified Seldinger technique, a 5 French sheath was introduced into the right radial artery. 3 mg of verapamil was administered through the sheath, weight-based unfractionated heparin was administered intravenously. Standard Judkins catheters were used for selective coronary angiography and left ventriculography. Catheter exchanges were performed over an exchange length guidewire. There were no immediate procedural complications. A TR band was used for radial hemostasis at the completion of the procedure.  The patient was transferred to the post catheterization recovery area for further monitoring.  Procedural Findings: Hemodynamics: AO 87/45 mean 62 LV 90/19  Coronary angiography: Coronary dominance: right  Left mainstem: The left main is calcified. There is 40-50% stenosis of the proximal and mid body of the left main.  Left anterior descending (LAD): The LAD is heavily calcified. The proximal LAD has diffuse 70% stenosis before the bifurcation of the LAD and first diagonal. There is severe bifurcational stenosis with heavy calcification. The diagonal branch has a 75% ostial stenosis. The mid LAD immediately beyond the diagonal origin has severe 90-95% stenosis. There is diffuse segmental disease throughout the mid LAD. The distal vessel is patent with minor luminal irregularity. The diagonal branch and LAD appear to be adequate targets for bypass.  Left circumflex (LCx): The left circumflex is mildly calcified. There is diffuse 60-70% stenosis throughout the proximal circumflex. The obtuse marginal branches  are relatively small in caliber without significant stenosis. The second OM is very small.  Right coronary artery (RCA): The right coronary artery is severely calcified. The vessel is severely diseased through the proximal aspect. The mid segment is totally occluded. The PDA and PLA branches fill from left to right collaterals.  Left ventriculography: Left ventricular systolic function is normal, LVEF is estimated at 55-65%, there is no significant mitral regurgitation   Final Conclusions:   1. Multivessel coronary artery disease with total occlusion of the right coronary artery and left to right collaterals, severe calcific bifurcational stenosis of the proximal LAD, and mild to moderate stenosis of the left main and left circumflex 2. Normal left ventricular systolic function  Recommendations: The patient has severe multivessel disease with moderate ischemia on stress testing. He is right coronary artery is not approachable percutaneously. His LAD involves the bifurcation with heavy calcification. He's had previous femoropopliteal bypass on the right, but hopefully has suitable vein on the left for grafting. I'm going to refer him to cardiac surgery for consideration of CABG. He has been enrolled in the [facility] Trial which randomizes him to antiplatelet monotherapy with either brilinta or clopidogrel. I will take him off of study drug and start him on aspirin 81 mg daily in anticipation of cardiac surgery.  Sherren Mocha 05/02/2013, 9:13 AM

## 2013-05-02 NOTE — H&P (View-Only) (Signed)
HPI:  62 year old gentleman presenting for followup evaluation. The patient is followed for lower extremity peripheral arterial disease. He underwent right SFA stenting  and left common iliac stenting in 2008. He also underwent left external iliac stenting in 2012. He developed severe intermittent claudication of the right leg and ultimately required right femoral endarterectomy and femoropopliteal bypass.  The patient is able to do much more since surgery. His right leg feels much better. He complains of some numbness and discomfort in the left upper leg, but no radiation to the calf or foot. He complains of shortness of breath with activity. He denies chest pain or pressure. Yesterday he had an episode of dizziness and diaphoresis. He has quit smoking since January. He's using vaporize cigarettes and now is down to a point where there is no nicotine.  Outpatient Encounter Prescriptions as of 04/08/2013  Medication Sig Dispense Refill  . atenolol (TENORMIN) 50 MG tablet Take 1 tablet (50 mg total) by mouth daily.  90 tablet  1  . atorvastatin (LIPITOR) 20 MG tablet Take 1 tablet by mouth daily.      . budesonide-formoterol (SYMBICORT) 160-4.5 MCG/ACT inhaler Take 2 puffs first thing in am and then another 2 puffs about 12 hours later.  1 Inhaler  11  . cholecalciferol (VITAMIN D) 1000 UNITS tablet Take 1,000 Units by mouth daily.        Marland Kitchen losartan-hydrochlorothiazide (HYZAAR) 100-25 MG per tablet Take 1 tablet by mouth daily.  90 tablet  3  . ranitidine (ZANTAC) 150 MG tablet Take 150 mg by mouth daily as needed. For heartburn       . sildenafil (VIAGRA) 100 MG tablet Take 100 mg by mouth daily as needed. For erectile dysfunction      . STUDY MEDICATION Take 1 tablet by mouth daily. "[employer]      . STUDY MEDICATION Take 1 tablet by mouth 2 (two) times daily. "[employer]      . traMADol (ULTRAM) 50 MG tablet Take 50-100 mg by mouth 2 (two) times daily as  needed. For pain      . vitamin B-12 (CYANOCOBALAMIN) 1000 MCG tablet Take 1,000 mcg by mouth daily.      . [DISCONTINUED] albuterol (PROVENTIL HFA;VENTOLIN HFA) 108 (90 BASE) MCG/ACT inhaler Inhale 2 puffs into the lungs every 6 (six) hours as needed for wheezing.  1 Inhaler  2   No facility-administered encounter medications on file as of 04/08/2013.    Allergies  Allergen Reactions  . Benazepril     cough    Past Medical History  Diagnosis Date  . History of colonic polyps   . COPD (chronic obstructive pulmonary disease)   . CAD (coronary artery disease)     Mild plaque (cath "years ago")  . GERD (gastroesophageal reflux disease)   . Hyperlipidemia   . Hypertension   . Vitamin D deficiency   . Meralgia paresthetica of left side 2011  . LBP (low back pain)   . PVD (peripheral vascular disease)     Stent to left common femoral and right superficial femoral.  2008.  50%  left renal   . Sleep apnea     mod OSA, central sleep apnea/hypoapnea syndrome 11/22/12    ROS: Negative except as per HPI  BP 120/64  Pulse 58  Ht 5\' 5"  (1.651 m)  Wt 77.111 kg (170 lb)  BMI 28.29 kg/m2  SpO2 98%  PHYSICAL EXAM: Pt is alert and oriented,  NAD HEENT: normal Neck: JVP - normal, carotids 2+= without bruits Lungs: CTA bilaterally CV: RRR without murmur or gallop Abd: soft, NT, Positive BS, no hepatomegaly Ext: no C/C/E Skin: warm/dry no rash  ASSESSMENT AND PLAN: 1. Shortness of breath. Unclear etiology. He was evaluated by pulmonary and he does not have COPD. He is a long-standing smoker and this is the most likely etiology, but unusual that his PFTs are normal. Considering his known peripheral arterial disease, will check an exercise Myoview scan to rule out an ischemic equivalent. He is at high risk for coronary disease.  2. The peripheral arterial disease. He appears stable. I'm not sure that his left leg symptoms are related to occlusive arterial disease. He has followup next week  with Dr. Trula Slade and arterial studies are scheduled for that visit.  3. Hyperlipidemia. The patient is on atorvastatin. Lipids from October 2013 showed cholesterol of 236, LDL 160, HDL 37. I think he was off of his statin drug at that time.  4. Hypertension. Blood pressure is well controlled on losartan, atenolol, and hydrochlorothiazide.  Will review his stress test when available. Otherwise will see him back next year. He is also followed through the [facility] protocol.  Sherren Mocha 04/08/2013 12:50 PM

## 2013-05-02 NOTE — OR Nursing (Signed)
+  Allen's test right hand 

## 2013-05-02 NOTE — Interval H&P Note (Signed)
History and Physical Interval Note:  05/02/2013 8:30 AM  Douglas Christensen  has presented today for surgery, with the diagnosis of cp  The various methods of treatment have been discussed with the patient and family. After consideration of risks, benefits and other options for treatment, the patient has consented to  Procedure(s): JV LEFT HEART CATHETERIZATION WITH CORONARY ANGIOGRAM (N/A) as a surgical intervention .  The patient's history has been reviewed, patient examined, no change in status, stable for surgery.  I have reviewed the patient's chart and labs.  Questions were answered to the patient's satisfaction.    The patient has known peripheral arterial disease. He has had worsening chest pain and shortness of breath. He had a moderate risk nuclear scan. Plans for cardiac catheterization and possible PCI as above.   Sherren Mocha

## 2013-05-02 NOTE — OR Nursing (Signed)
Discharge instructions reviewed and signed, pt stated understanding, ambulated in hall without difficulty, transported to friend's car via wheelchair

## 2013-05-02 NOTE — OR Nursing (Signed)
Meal served 

## 2013-05-03 ENCOUNTER — Other Ambulatory Visit: Payer: Self-pay | Admitting: *Deleted

## 2013-05-03 ENCOUNTER — Encounter (HOSPITAL_COMMUNITY): Payer: Self-pay | Admitting: Pharmacy Technician

## 2013-05-03 ENCOUNTER — Encounter (HOSPITAL_COMMUNITY)
Admission: RE | Admit: 2013-05-03 | Discharge: 2013-05-03 | Disposition: A | Discharge status: Home / Self Care | Payer: 59 | Source: Ambulatory Visit | Attending: Thoracic Surgery (Cardiothoracic Vascular Surgery) | Admitting: Thoracic Surgery (Cardiothoracic Vascular Surgery)

## 2013-05-03 ENCOUNTER — Other Ambulatory Visit: Payer: Self-pay | Admitting: Internal Medicine

## 2013-05-03 ENCOUNTER — Institutional Professional Consult (permissible substitution) (INDEPENDENT_AMBULATORY_CARE_PROVIDER_SITE_OTHER): Payer: 59 | Admitting: Thoracic Surgery (Cardiothoracic Vascular Surgery)

## 2013-05-03 ENCOUNTER — Encounter (HOSPITAL_COMMUNITY): Payer: Self-pay

## 2013-05-03 ENCOUNTER — Ambulatory Visit (HOSPITAL_COMMUNITY)
Admission: RE | Admit: 2013-05-03 | Discharge: 2013-05-03 | Disposition: A | Discharge status: Home / Self Care | Payer: 59 | Source: Ambulatory Visit | Attending: Thoracic Surgery (Cardiothoracic Vascular Surgery) | Admitting: Thoracic Surgery (Cardiothoracic Vascular Surgery)

## 2013-05-03 ENCOUNTER — Encounter: Payer: Self-pay | Admitting: Thoracic Surgery (Cardiothoracic Vascular Surgery)

## 2013-05-03 VITALS — BP 150/83 | HR 102 | Temp 98.1°F | Resp 20 | Ht 65.0 in | Wt 176.8 lb

## 2013-05-03 VITALS — BP 136/88 | HR 100 | Resp 20 | Ht 65.0 in | Wt 165.0 lb

## 2013-05-03 DIAGNOSIS — I2510 Atherosclerotic heart disease of native coronary artery without angina pectoris: Secondary | ICD-10-CM

## 2013-05-03 DIAGNOSIS — R0602 Shortness of breath: Secondary | ICD-10-CM | POA: Insufficient documentation

## 2013-05-03 DIAGNOSIS — Z01812 Encounter for preprocedural laboratory examination: Secondary | ICD-10-CM | POA: Insufficient documentation

## 2013-05-03 DIAGNOSIS — Z01810 Encounter for preprocedural cardiovascular examination: Secondary | ICD-10-CM | POA: Insufficient documentation

## 2013-05-03 DIAGNOSIS — Z01818 Encounter for other preprocedural examination: Secondary | ICD-10-CM | POA: Insufficient documentation

## 2013-05-03 DIAGNOSIS — I739 Peripheral vascular disease, unspecified: Secondary | ICD-10-CM

## 2013-05-03 HISTORY — DX: Shortness of breath: R06.02

## 2013-05-03 LAB — COMPREHENSIVE METABOLIC PANEL
ALT: 35 U/L (ref 0–53)
AST: 33 U/L (ref 0–37)
Alkaline Phosphatase: 69 U/L (ref 39–117)
CO2: 22 mEq/L (ref 19–32)
Chloride: 106 mEq/L (ref 96–112)
GFR calc Af Amer: >90 mL/min (ref >90)
GFR calc non Af Amer: 78 mL/min — ABNORMAL LOW (ref >90)
Glucose, Bld: 96 mg/dL (ref 70–99)
Potassium: 4 mEq/L (ref 3.5–5.1)
Sodium: 137 mEq/L (ref 135–145)

## 2013-05-03 LAB — TYPE AND SCREEN: Antibody Screen: NEGATIVE

## 2013-05-03 LAB — URINALYSIS, ROUTINE W REFLEX MICROSCOPIC
Bilirubin Urine: NEGATIVE
Ketones, ur: NEGATIVE mg/dL
Leukocytes, UA: NEGATIVE
Nitrite: NEGATIVE
Specific Gravity, Urine: 1.016 (ref 1.005–1.030)
Urobilinogen, UA: 0.2 mg/dL (ref 0.0–1.0)
pH: 5.5 (ref 5.0–8.0)

## 2013-05-03 LAB — CBC
Hemoglobin: 13.9 g/dL (ref 13.0–17.0)
MCH: 32.9 pg (ref 26.0–34.0)
Platelets: 241 10*3/uL (ref 150–400)
RBC: 4.22 MIL/uL (ref 4.22–5.81)
WBC: 9 10*3/uL (ref 4.0–10.5)

## 2013-05-03 LAB — PROTIME-INR: Prothrombin Time: 12.2 seconds (ref 11.6–15.2)

## 2013-05-03 LAB — SURGICAL PCR SCREEN: Staphylococcus aureus: NEGATIVE

## 2013-05-03 LAB — APTT: aPTT: 28 seconds (ref 24–37)

## 2013-05-03 NOTE — Pre-Procedure Instructions (Signed)
Douglas Christensen  05/03/2013   Your procedure is scheduled on:  Monday, June 9th. Report to [facility] through the Main Entrance , go to the [facility] , go to 3rd Floor, Short Stay.   Call this number if you have problems the morning of surgery: [phone]   Remember:   Do not eat food or drink liquids after midnight.   Take these medicines the morning of surgery with A SIP OF WATER: atenolol (TENORMIN).               Use :budesonide-formoterol (SYMBICORT) inhaler.               May take: ranitidine (ZANTAC) if needed   Do not wear jewelry, make-up or nail polish.  Do not wear lotions, powders, or perfumes. You may wear deodorant.   Men may shave face and neck.  Do not bring valuables to the hospital.  [facility] is not responsible  for any belongings or valuables.  Contacts, dentures or bridgework may not be worn into surgery.  Leave suitcase in the car. After surgery it may be brought to your room.  For patients admitted to the hospital, checkout time is 11:00 AM the day of discharge.    Special Instructions: Shower using CHG 2 nights before surgery and the night before surgery.  If you shower the day of surgery use CHG.  Use special wash - you have one bottle of CHG for all showers.  You should use approximately 1/3 of the bottle for each shower.   Please read over the following fact sheets that you were given: Pain Booklet, Coughing and Deep Breathing, Blood Transfusion Information and Surgical Site Infection Prevention

## 2013-05-03 NOTE — Progress Notes (Signed)
PCP is Douglas Kehr, MD Referring Provider is Douglas Mocha, MD  Chief Complaint  Patient presents with  . Coronary Artery Disease    surgical eval, cardiac cath 05/02/13     HPI: 62 year old presents with a chief complaint of shortness of breath with exertion.  Douglas Christensen is a 62 year old gentleman with a history of mild coronary disease, heavy tobacco abuse, peripheral arterial disease, hypertension, and hyperlipidemia. He has been having problems with shortness of breath with exertion for several years now. For a long time he would only have it with heavy exertion. However, over the last several months he has been having it about 2-3 times a week at rest. He says he has woken up in the middle the night a couple of times feeling that he was going to die. He denies any chest, neck or arm pain associated with the shortness of breath.  He's had extensive workup and was seen by Dr. Melvyn Novas. Pulmonary function testing showed FEV1 and FEC in the 75% range and diffusion capacity in the 80% range. Cardiac workup was done. An exercise Myoview was a moderate risk study with inferior ischemia. Cardiac catheterization was done yesterday and revealed severe three-vessel coronary disease not amenable to percutaneous intervention.   Past Medical History  Diagnosis Date  . History of colonic polyps   . COPD (chronic obstructive pulmonary disease)   . CAD (coronary artery disease)     Mild plaque (cath "years ago")  . GERD (gastroesophageal reflux disease)   . Hyperlipidemia   . Hypertension   . Vitamin D deficiency   . Meralgia paresthetica of left side 2011  . LBP (low back pain)   . PVD (peripheral vascular disease)     Stent to left common femoral and right superficial femoral.  2008.  50%  left renal   . Sleep apnea     mod OSA, central sleep apnea/hypoapnea syndrome 11/22/12    Past Surgical History  Procedure Laterality Date  . None    . Lower extremity stents      bilateral lower  extremities x 2  . Cardiac catheterization    . Femoral-popliteal bypass graft  12/27/2012    Procedure: BYPASS GRAFT FEMORAL-POPLITEAL ARTERY;  Surgeon: Serafina Mitchell, MD;  Location: [facility] OR;  Service: Vascular;  Laterality: Right;  using non-reversed sapphenous vein.    Family History  Problem Relation Age of Onset  . Heart disease Mother     No clear CAD  . Hypertension Mother   . Cancer Mother     ? colon ca  . Hyperlipidemia Mother   . Cancer Father     brain tumor  . Alzheimer's disease Father   . Colon cancer Neg Hx     Social History History  Substance Use Topics  . Smoking status: Former Smoker -- 2.00 packs/day for 47 years    Types: Cigarettes    Quit date: 12/27/2012  . Smokeless tobacco: Never Used  . Alcohol Use: 7.2 oz/week    2 Cans of beer, 10 Shots of liquor per week    Current Outpatient Prescriptions  Medication Sig Dispense Refill  . aspirin 81 MG tablet Take 81 mg by mouth daily.      Marland Kitchen atenolol (TENORMIN) 50 MG tablet Take 1 tablet (50 mg total) by mouth daily.  90 tablet  1  . atorvastatin (LIPITOR) 20 MG tablet Take 1 tablet by mouth daily.      . budesonide-formoterol (SYMBICORT) 160-4.5 MCG/ACT inhaler Take 2  puffs first thing in am and then another 2 puffs about 12 hours later.  1 Inhaler  11  . cholecalciferol (VITAMIN D) 1000 UNITS tablet Take 1,000 Units by mouth daily.        Marland Kitchen losartan-hydrochlorothiazide (HYZAAR) 100-25 MG per tablet Take 1 tablet by mouth daily.  90 tablet  3  . ranitidine (ZANTAC) 150 MG tablet Take 150 mg by mouth daily as needed. For heartburn       . sildenafil (VIAGRA) 100 MG tablet Take 100 mg by mouth daily as needed. For erectile dysfunction      . STUDY MEDICATION Take 1 tablet by mouth daily. "[employer]      . STUDY MEDICATION Take 1 tablet by mouth 2 (two) times daily. "[employer]      . vitamin B-12 (CYANOCOBALAMIN) 1000 MCG tablet Take 1,000 mcg by mouth daily.       No  current facility-administered medications for this visit.    Allergies  Allergen Reactions  . Benazepril     cough    Review of Systems  Constitutional: Positive for activity change and fatigue.  Respiratory: Positive for cough and shortness of breath. Negative for chest tightness.   Cardiovascular: Negative for chest pain and leg swelling.       Claudication improved after femoropopliteal bypass in January  Neurological: Negative.   Hematological: Does not bruise/bleed easily.  All other systems reviewed and are negative.    BP 136/88  Pulse 100  Resp 20  Ht 5\' 5"  (1.651 m)  Wt 165 lb (74.844 kg)  BMI 27.46 kg/m2  SpO2 98% Physical Exam  Vitals reviewed. Constitutional: He is oriented to person, place, and time. He appears well-developed and well-nourished. No distress.  HENT:  Head: Normocephalic and atraumatic.  Eyes: EOM are normal. Pupils are equal, round, and reactive to light.  Neck: Neck supple. No thyromegaly present.  No carotid bruits  Cardiovascular: Normal rate, regular rhythm and normal heart sounds.  Exam reveals no gallop and no friction rub.   No murmur heard. Unable to palpate lower extremity pulses  Pulmonary/Chest: Effort normal and breath sounds normal. He has no wheezes. He has no rales.  Abdominal: Soft. There is no tenderness.  Musculoskeletal: He exhibits no edema.  Healed surgical scars right leg  Lymphadenopathy:    He has no cervical adenopathy.  Neurological: He is alert and oriented to person, place, and time. No cranial nerve deficit.  Skin: Skin is warm and dry.   normal Allen's test left arm   Diagnostic Tests: Cardiac catheterization Procedural Findings:  Hemodynamics:  AO 87/45 mean 62  LV 90/19  Coronary angiography:  Coronary dominance: right  Left mainstem: The left main is calcified. There is 40-50% stenosis of the proximal and mid body of the left main.  Left anterior descending (LAD): The LAD is heavily calcified. The  proximal LAD has diffuse 70% stenosis before the bifurcation of the LAD and first diagonal. There is severe bifurcational stenosis with heavy calcification. The diagonal branch has a 75% ostial stenosis. The mid LAD immediately beyond the diagonal origin has severe 90-95% stenosis. There is diffuse segmental disease throughout the mid LAD. The distal vessel is patent with minor luminal irregularity. The diagonal branch and LAD appear to be adequate targets for bypass.  Left circumflex (LCx): The left circumflex is mildly calcified. There is diffuse 60-70% stenosis throughout the proximal circumflex. The obtuse marginal branches are relatively small in caliber without  significant stenosis. The second OM is very small.  Right coronary artery (RCA): The right coronary artery is severely calcified. The vessel is severely diseased through the proximal aspect. The mid segment is totally occluded. The PDA and PLA branches fill from left to right collaterals.  Left ventriculography: Left ventricular systolic function is normal, LVEF is estimated at 55-65%, there is no significant mitral regurgitation  Final Conclusions:  1. Multivessel coronary artery disease with total occlusion of the right coronary artery and left to right collaterals, severe calcific bifurcational stenosis of the proximal LAD, and mild to moderate stenosis of the left main and left circumflex  2. Normal left ventricular systolic function  Recommendations: The patient has severe multivessel disease with moderate ischemia on stress testing. He is right coronary artery is not approachable percutaneously. His LAD involves the bifurcation with heavy calcification. He's had previous femoropopliteal bypass on the right, but hopefully has suitable vein on the left for grafting. I'm going to refer him to cardiac surgery for consideration of CABG. He has been enrolled in the [facility] Trial which randomizes him to antiplatelet monotherapy with either brilinta  or clopidogrel. I will take him off of study drug and start him on aspirin 81 mg daily in anticipation of cardiac surgery.   Impression: 62 year old gentleman with multiple cardiac risk factors and known atherosclerotic cardiovascular disease. He presents with progressive shortness of breath with exertion and now is having symptoms at rest as well. A long smoking history however his pulmonary function testing is certainly not severe enough to account for his dyspnea. This most likely is due to his coronary disease. His cardiac workup has revealed severe three-vessel coronary disease with preserved left ventricular function. Coronary bypass grafting is indicated for survival benefit. I suspect he will also have relief of symptoms as well.  It was noted the time of his femoral-popliteal bypass that is vena from his right leg was suboptimal in quality. We'll plan to use his left mammary. We will also likely use the left radial artery as well as he does have a normal Allen's test. We'll also plan to use vein from the left leg if it is acceptable.  He had a carotid duplex in January which showed no significant stenoses.  Pulmonary function testing showed an FEV1 of 2.55 (74%) and an FVC of 3.53 (73% ), DLCO 82%  I discussed with him the general nature of the procedure, the need for general anesthesia, and the incisions to be used. We we reviewed the indications, risks, benefits, and alternatives. We discussed the expected hospital stay, overall recovery and short and long term outcomes. He understands the risks include but are not limited to death, stroke, MI, DVT/PE, bleeding, possible need for transfusion, infections, cardiac arrhythmias, and other organ system dysfunction including respiratory, renal, or GI complications. He accepts these risks and agrees to proceed.  Plan: Coronary artery bypass grafting with possible left radial artery harvest on Monday, 05/06/2013

## 2013-05-03 NOTE — Progress Notes (Signed)
05/03/13 1709  OBSTRUCTIVE SLEEP APNEA  Have you ever been diagnosed with sleep apnea through a sleep study? No  Do you snore loudly (loud enough to be heard through closed doors)?  1  Do you often feel tired, fatigued, or sleepy during the daytime? 1  Has anyone observed you stop breathing during your sleep? 0  Do you have, or are you being treated for high blood pressure? 1  BMI more than 35 kg/m2? 0  Age over 62 years old? 1  Neck circumference greater than 40 cm/18 inches? 0  Gender: 1  Obstructive Sleep Apnea Score 5

## 2013-05-05 MED ORDER — SODIUM CHLORIDE 0.9 % IV SOLN
INTRAVENOUS | Status: AC
  Administered 2013-05-06: 70 mL/h via INTRAVENOUS
  Filled 2013-05-05: qty 40

## 2013-05-05 MED ORDER — DEXTROSE 5 % IV SOLN
1.5000 g | INTRAVENOUS | Status: AC
  Administered 2013-05-06: 1.5 g via INTRAVENOUS
  Administered 2013-05-06: .75 g via INTRAVENOUS
  Filled 2013-05-05 (×2): qty 1.5

## 2013-05-05 MED ORDER — VANCOMYCIN HCL 10 G IV SOLR
1250.0000 mg | INTRAVENOUS | Status: AC
  Administered 2013-05-06: 1250 mg via INTRAVENOUS
  Filled 2013-05-05 (×2): qty 1250

## 2013-05-05 MED ORDER — SODIUM CHLORIDE 0.9 % IV SOLN
INTRAVENOUS | Status: AC
  Administered 2013-05-06: 1 [IU]/h via INTRAVENOUS
  Filled 2013-05-05: qty 1

## 2013-05-05 MED ORDER — NITROGLYCERIN IN D5W 200-5 MCG/ML-% IV SOLN
2.0000 ug/min | INTRAVENOUS | Status: AC
  Administered 2013-05-06: 5 ug/min via INTRAVENOUS
  Filled 2013-05-05: qty 250

## 2013-05-05 MED ORDER — MAGNESIUM SULFATE 50 % IJ SOLN
40.0000 meq | INTRAMUSCULAR | Status: DC
  Filled 2013-05-05: qty 10

## 2013-05-05 MED ORDER — DEXTROSE 5 % IV SOLN
750.0000 mg | INTRAVENOUS | Status: DC
  Filled 2013-05-05: qty 750

## 2013-05-05 MED ORDER — DEXMEDETOMIDINE HCL IN NACL 400 MCG/100ML IV SOLN
0.1000 ug/kg/h | INTRAVENOUS | Status: AC
  Administered 2013-05-06: 0.2 ug/kg/h via INTRAVENOUS
  Filled 2013-05-05: qty 100

## 2013-05-05 MED ORDER — POTASSIUM CHLORIDE 2 MEQ/ML IV SOLN
80.0000 meq | INTRAVENOUS | Status: DC
  Filled 2013-05-05: qty 40

## 2013-05-05 MED ORDER — HEPARIN SODIUM (PORCINE) 1000 UNIT/ML IJ SOLN
INTRAMUSCULAR | Status: DC
  Filled 2013-05-05: qty 30

## 2013-05-05 MED ORDER — DOPAMINE-DEXTROSE 3.2-5 MG/ML-% IV SOLN
2.0000 ug/kg/min | INTRAVENOUS | Status: DC
  Filled 2013-05-05: qty 250

## 2013-05-05 MED ORDER — EPINEPHRINE HCL 1 MG/ML IJ SOLN
0.5000 ug/min | INTRAVENOUS | Status: DC
  Filled 2013-05-05: qty 4

## 2013-05-05 MED ORDER — PHENYLEPHRINE HCL 10 MG/ML IJ SOLN
30.0000 ug/min | INTRAVENOUS | Status: AC
  Administered 2013-05-06: 15 ug/min via INTRAVENOUS
  Filled 2013-05-05: qty 2

## 2013-05-05 MED ORDER — METOPROLOL TARTRATE 12.5 MG HALF TABLET: 12.5000 mg | ORAL_TABLET | Freq: Once | ORAL | Status: DC

## 2013-05-05 MED ORDER — PLASMA-LYTE 148 IV SOLN
INTRAVENOUS | Status: AC
  Administered 2013-05-06: 09:00:00
  Filled 2013-05-05: qty 2.5

## 2013-05-06 ENCOUNTER — Inpatient Hospital Stay (HOSPITAL_COMMUNITY): Payer: 59

## 2013-05-06 ENCOUNTER — Encounter (HOSPITAL_COMMUNITY): Payer: Self-pay | Admitting: *Deleted

## 2013-05-06 ENCOUNTER — Encounter (HOSPITAL_COMMUNITY): Payer: Self-pay | Admitting: Anesthesiology

## 2013-05-06 ENCOUNTER — Inpatient Hospital Stay (HOSPITAL_COMMUNITY)
Admission: RE | Admit: 2013-05-06 | Discharge: 2013-05-12 | DRG: 236 | Disposition: A | Discharge status: Home / Self Care | Payer: 59 | Source: Ambulatory Visit | Attending: Thoracic Surgery (Cardiothoracic Vascular Surgery) | Admitting: Thoracic Surgery (Cardiothoracic Vascular Surgery)

## 2013-05-06 ENCOUNTER — Encounter (HOSPITAL_COMMUNITY)
Admission: RE | Disposition: A | Discharge status: Home / Self Care | Payer: Self-pay | Source: Ambulatory Visit | Attending: Thoracic Surgery (Cardiothoracic Vascular Surgery)

## 2013-05-06 ENCOUNTER — Ambulatory Visit (HOSPITAL_COMMUNITY): Payer: 59 | Admitting: Anesthesiology

## 2013-05-06 DIAGNOSIS — I498 Other specified cardiac arrhythmias: Secondary | ICD-10-CM | POA: Diagnosis not present

## 2013-05-06 DIAGNOSIS — I739 Peripheral vascular disease, unspecified: Secondary | ICD-10-CM | POA: Diagnosis present

## 2013-05-06 DIAGNOSIS — J449 Chronic obstructive pulmonary disease, unspecified: Secondary | ICD-10-CM | POA: Diagnosis present

## 2013-05-06 DIAGNOSIS — D62 Acute posthemorrhagic anemia: Secondary | ICD-10-CM | POA: Diagnosis not present

## 2013-05-06 DIAGNOSIS — K219 Gastro-esophageal reflux disease without esophagitis: Secondary | ICD-10-CM | POA: Diagnosis present

## 2013-05-06 DIAGNOSIS — E785 Hyperlipidemia, unspecified: Secondary | ICD-10-CM | POA: Diagnosis present

## 2013-05-06 DIAGNOSIS — G5710 Meralgia paresthetica, unspecified lower limb: Secondary | ICD-10-CM | POA: Diagnosis present

## 2013-05-06 DIAGNOSIS — E559 Vitamin D deficiency, unspecified: Secondary | ICD-10-CM | POA: Diagnosis present

## 2013-05-06 DIAGNOSIS — J4489 Other specified chronic obstructive pulmonary disease: Secondary | ICD-10-CM | POA: Diagnosis present

## 2013-05-06 DIAGNOSIS — I2510 Atherosclerotic heart disease of native coronary artery without angina pectoris: Secondary | ICD-10-CM

## 2013-05-06 DIAGNOSIS — Z79899 Other long term (current) drug therapy: Secondary | ICD-10-CM

## 2013-05-06 DIAGNOSIS — Z87891 Personal history of nicotine dependence: Secondary | ICD-10-CM

## 2013-05-06 DIAGNOSIS — G4733 Obstructive sleep apnea (adult) (pediatric): Secondary | ICD-10-CM | POA: Diagnosis present

## 2013-05-06 DIAGNOSIS — Z860100 Personal history of colon polyps, unspecified: Secondary | ICD-10-CM

## 2013-05-06 DIAGNOSIS — I459 Conduction disorder, unspecified: Secondary | ICD-10-CM | POA: Diagnosis not present

## 2013-05-06 DIAGNOSIS — I10 Essential (primary) hypertension: Secondary | ICD-10-CM | POA: Diagnosis present

## 2013-05-06 DIAGNOSIS — F29 Unspecified psychosis not due to a substance or known physiological condition: Secondary | ICD-10-CM | POA: Diagnosis not present

## 2013-05-06 DIAGNOSIS — Z7982 Long term (current) use of aspirin: Secondary | ICD-10-CM

## 2013-05-06 DIAGNOSIS — R404 Transient alteration of awareness: Secondary | ICD-10-CM | POA: Diagnosis not present

## 2013-05-06 DIAGNOSIS — R031 Nonspecific low blood-pressure reading: Secondary | ICD-10-CM | POA: Diagnosis not present

## 2013-05-06 DIAGNOSIS — E8779 Other fluid overload: Secondary | ICD-10-CM | POA: Diagnosis not present

## 2013-05-06 HISTORY — PX: CORONARY ARTERY BYPASS GRAFT: SHX141

## 2013-05-06 LAB — POCT I-STAT 3, ART BLOOD GAS (G3+)
Acid-base deficit: 7 mmol/L — ABNORMAL HIGH (ref 0.0–2.0)
Acid-base deficit: 4 mmol/L — ABNORMAL HIGH (ref 0.0–2.0)
Acid-base deficit: 5 mmol/L — ABNORMAL HIGH (ref 0.0–2.0)
Acid-base deficit: 3 mmol/L — ABNORMAL HIGH (ref 0.0–2.0)
Bicarbonate: 24.3 mEq/L — ABNORMAL HIGH (ref 20.0–24.0)
O2 Saturation: 100 %
O2 Saturation: 92 %
O2 Saturation: 96 %
Patient temperature: 35.6
TCO2: 22 mmol/L (ref 0–100)
TCO2: 26 mmol/L (ref 0–100)
pH, Arterial: 7.262 — ABNORMAL LOW (ref 7.350–7.450)
pO2, Arterial: 195 mmHg — ABNORMAL HIGH (ref 80.0–100.0)
pO2, Arterial: 66 mmHg — ABNORMAL LOW (ref 80.0–100.0)
pO2, Arterial: 94 mmHg (ref 80.0–100.0)

## 2013-05-06 LAB — BLOOD GAS, ARTERIAL
Acid-base deficit: 6.8 mmol/L — ABNORMAL HIGH (ref 0.0–2.0)
Drawn by: 181601
O2 Saturation: 95.8 %
Patient temperature: 98.6

## 2013-05-06 LAB — CBC
HCT: 29.7 % — ABNORMAL LOW (ref 39.0–52.0)
Hemoglobin: 11 g/dL — ABNORMAL LOW (ref 13.0–17.0)
Hemoglobin: 10.5 g/dL — ABNORMAL LOW (ref 13.0–17.0)
MCH: 33.2 pg (ref 26.0–34.0)
MCH: 33.1 pg (ref 26.0–34.0)
MCHC: 35.6 g/dL (ref 30.0–36.0)
MCHC: 35.4 g/dL (ref 30.0–36.0)
Platelets: 145 10*3/uL — ABNORMAL LOW (ref 150–400)
RDW: 13.1 % (ref 11.5–15.5)
RDW: 13.1 % (ref 11.5–15.5)

## 2013-05-06 LAB — MAGNESIUM: Magnesium: 2.8 mg/dL — ABNORMAL HIGH (ref 1.5–2.5)

## 2013-05-06 LAB — GLUCOSE, CAPILLARY
Glucose-Capillary: 118 mg/dL — ABNORMAL HIGH (ref 70–99)
Glucose-Capillary: 121 mg/dL — ABNORMAL HIGH (ref 70–99)
Glucose-Capillary: 134 mg/dL — ABNORMAL HIGH (ref 70–99)

## 2013-05-06 LAB — POCT I-STAT, CHEM 8
BUN: 21 mg/dL (ref 6–23)
Calcium, Ion: 1.1 mmol/L — ABNORMAL LOW (ref 1.13–1.30)
Chloride: 107 mEq/L (ref 96–112)
Creatinine, Ser: 1.2 mg/dL (ref 0.50–1.35)
Glucose, Bld: 111 mg/dL — ABNORMAL HIGH (ref 70–99)

## 2013-05-06 LAB — PROTIME-INR
INR: 1.34 (ref 0.00–1.49)
Prothrombin Time: 16.3 seconds — ABNORMAL HIGH (ref 11.6–15.2)

## 2013-05-06 LAB — POCT I-STAT 4, (NA,K, GLUC, HGB,HCT)
Glucose, Bld: 85 mg/dL (ref 70–99)
Glucose, Bld: 122 mg/dL — ABNORMAL HIGH (ref 70–99)
Glucose, Bld: 102 mg/dL — ABNORMAL HIGH (ref 70–99)
HCT: 35 % — ABNORMAL LOW (ref 39.0–52.0)
HCT: 25 % — ABNORMAL LOW (ref 39.0–52.0)
HCT: 31 % — ABNORMAL LOW (ref 39.0–52.0)
Hemoglobin: 8.2 g/dL — ABNORMAL LOW (ref 13.0–17.0)
Hemoglobin: 7.8 g/dL — ABNORMAL LOW (ref 13.0–17.0)
Hemoglobin: 10.5 g/dL — ABNORMAL LOW (ref 13.0–17.0)
Potassium: 4.4 mEq/L (ref 3.5–5.1)
Potassium: 6.3 mEq/L (ref 3.5–5.1)
Sodium: 133 mEq/L — ABNORMAL LOW (ref 135–145)
Sodium: 131 mEq/L — ABNORMAL LOW (ref 135–145)
Sodium: 133 mEq/L — ABNORMAL LOW (ref 135–145)
Sodium: 135 mEq/L (ref 135–145)

## 2013-05-06 SURGERY — CORONARY ARTERY BYPASS GRAFTING (CABG)
Anesthesia: General | Site: Chest | Wound class: Clean

## 2013-05-06 MED ORDER — LEVALBUTEROL HCL 0.63 MG/3ML IN NEBU
0.6300 mg | INHALATION_SOLUTION | Freq: Four times a day (QID) | RESPIRATORY_TRACT | Status: DC
  Administered 2013-05-06 – 2013-05-07 (×5): 0.63 mg via RESPIRATORY_TRACT
  Filled 2013-05-06 (×13): qty 3

## 2013-05-06 MED ORDER — MIDAZOLAM HCL 5 MG/5ML IJ SOLN
INTRAMUSCULAR | Status: DC | PRN
  Administered 2013-05-06 (×2): 2 mg via INTRAVENOUS
  Administered 2013-05-06: 6 mg via INTRAVENOUS
  Administered 2013-05-06 (×2): 2 mg via INTRAVENOUS

## 2013-05-06 MED ORDER — DEXTROSE 5 % IV SOLN
1.5000 g | Freq: Two times a day (BID) | INTRAVENOUS | Status: AC
  Administered 2013-05-06 – 2013-05-08 (×4): 1.5 g via INTRAVENOUS
  Filled 2013-05-06 (×4): qty 1.5

## 2013-05-06 MED ORDER — 0.9 % SODIUM CHLORIDE (POUR BTL) OPTIME
TOPICAL | Status: DC | PRN
  Administered 2013-05-06: 6000 mL

## 2013-05-06 MED ORDER — LACTATED RINGERS IV SOLN
INTRAVENOUS | Status: DC | PRN
  Administered 2013-05-06 (×2): via INTRAVENOUS

## 2013-05-06 MED ORDER — LACTATED RINGERS IV SOLN: INTRAVENOUS | Status: DC

## 2013-05-06 MED ORDER — METOPROLOL TARTRATE 1 MG/ML IV SOLN: 2.5000 mg | INTRAVENOUS | Status: DC | PRN

## 2013-05-06 MED ORDER — SODIUM CHLORIDE 0.9 % IJ SOLN
3.0000 mL | Freq: Two times a day (BID) | INTRAMUSCULAR | Status: DC
  Administered 2013-05-07 – 2013-05-08 (×3): 3 mL via INTRAVENOUS

## 2013-05-06 MED ORDER — BISACODYL 10 MG RE SUPP
10.0000 mg | Freq: Every day | RECTAL | Status: DC
  Filled 2013-05-06: qty 1

## 2013-05-06 MED ORDER — CHLORHEXIDINE GLUCONATE 4 % EX LIQD: 30.0000 mL | CUTANEOUS | Status: DC

## 2013-05-06 MED ORDER — ALBUMIN HUMAN 5 % IV SOLN
INTRAVENOUS | Status: DC | PRN
  Administered 2013-05-06: 13:00:00 via INTRAVENOUS

## 2013-05-06 MED ORDER — MORPHINE SULFATE 2 MG/ML IJ SOLN
1.0000 mg | INTRAMUSCULAR | Status: AC | PRN
  Administered 2013-05-06 (×3): 2 mg via INTRAVENOUS
  Filled 2013-05-06 (×2): qty 1

## 2013-05-06 MED ORDER — METOPROLOL TARTRATE 25 MG/10 ML ORAL SUSPENSION
12.5000 mg | Freq: Two times a day (BID) | ORAL | Status: DC
  Filled 2013-05-06 (×5): qty 5

## 2013-05-06 MED ORDER — FENTANYL CITRATE 0.05 MG/ML IJ SOLN
INTRAMUSCULAR | Status: DC | PRN
  Administered 2013-05-06 (×3): 250 ug via INTRAVENOUS
  Administered 2013-05-06: 500 ug via INTRAVENOUS
  Administered 2013-05-06: 250 ug via INTRAVENOUS

## 2013-05-06 MED ORDER — PROTAMINE SULFATE 10 MG/ML IV SOLN
INTRAVENOUS | Status: DC | PRN
  Administered 2013-05-06 (×6): 30 mg via INTRAVENOUS
  Administered 2013-05-06: 10 mg via INTRAVENOUS
  Administered 2013-05-06: 30 mg via INTRAVENOUS

## 2013-05-06 MED ORDER — INSULIN ASPART 100 UNIT/ML ~~LOC~~ SOLN
0.0000 [IU] | SUBCUTANEOUS | Status: AC
  Administered 2013-05-06 – 2013-05-07 (×3): 2 [IU] via SUBCUTANEOUS

## 2013-05-06 MED ORDER — ACETAMINOPHEN 160 MG/5ML PO SOLN
975.0000 mg | Freq: Four times a day (QID) | ORAL | Status: AC
  Filled 2013-05-06: qty 40.6

## 2013-05-06 MED ORDER — PANTOPRAZOLE SODIUM 40 MG PO TBEC
40.0000 mg | DELAYED_RELEASE_TABLET | Freq: Every day | ORAL | Status: DC
  Administered 2013-05-08 – 2013-05-12 (×4): 40 mg via ORAL
  Filled 2013-05-06 (×4): qty 1

## 2013-05-06 MED ORDER — ALBUMIN HUMAN 5 % IV SOLN
250.0000 mL | INTRAVENOUS | Status: AC | PRN
  Administered 2013-05-06: 250 mL via INTRAVENOUS
  Filled 2013-05-06: qty 250

## 2013-05-06 MED ORDER — LEVALBUTEROL HCL 0.63 MG/3ML IN NEBU
0.6300 mg | INHALATION_SOLUTION | RESPIRATORY_TRACT | Status: DC | PRN
  Administered 2013-05-06: 0.63 mg via RESPIRATORY_TRACT
  Filled 2013-05-06: qty 3

## 2013-05-06 MED ORDER — DEXMEDETOMIDINE HCL IN NACL 200 MCG/50ML IV SOLN
0.1000 ug/kg/h | INTRAVENOUS | Status: DC
  Administered 2013-05-06 (×2): 0.5 ug/kg/h via INTRAVENOUS
  Filled 2013-05-06 (×2): qty 50

## 2013-05-06 MED ORDER — INSULIN ASPART 100 UNIT/ML ~~LOC~~ SOLN
0.0000 [IU] | SUBCUTANEOUS | Status: DC
  Administered 2013-05-07 – 2013-05-08 (×2): 2 [IU] via SUBCUTANEOUS

## 2013-05-06 MED ORDER — SODIUM CHLORIDE 0.9 % IJ SOLN: 3.0000 mL | INTRAMUSCULAR | Status: DC | PRN

## 2013-05-06 MED ORDER — MAGNESIUM SULFATE 40 MG/ML IJ SOLN
4.0000 g | Freq: Once | INTRAMUSCULAR | Status: AC
  Administered 2013-05-06: 4 g via INTRAVENOUS

## 2013-05-06 MED ORDER — ONDANSETRON HCL 4 MG/2ML IJ SOLN
4.0000 mg | Freq: Four times a day (QID) | INTRAMUSCULAR | Status: DC | PRN
  Administered 2013-05-07 – 2013-05-08 (×2): 4 mg via INTRAVENOUS
  Filled 2013-05-06 (×5): qty 2

## 2013-05-06 MED ORDER — ASPIRIN 81 MG PO CHEW: 324.0000 mg | CHEWABLE_TABLET | Freq: Every day | ORAL | Status: DC

## 2013-05-06 MED ORDER — ACETAMINOPHEN 10 MG/ML IV SOLN
1000.0000 mg | Freq: Once | INTRAVENOUS | Status: AC
  Administered 2013-05-06: 1000 mg via INTRAVENOUS

## 2013-05-06 MED ORDER — PHENYLEPHRINE HCL 10 MG/ML IJ SOLN
0.0000 ug/min | INTRAVENOUS | Status: DC
  Filled 2013-05-06: qty 2

## 2013-05-06 MED ORDER — ACETAMINOPHEN 500 MG PO TABS
1000.0000 mg | ORAL_TABLET | Freq: Four times a day (QID) | ORAL | Status: AC
  Administered 2013-05-07 – 2013-05-11 (×13): 1000 mg via ORAL
  Filled 2013-05-06 (×20): qty 2

## 2013-05-06 MED ORDER — SODIUM CHLORIDE 0.9 % IV SOLN
1.0000 g/h | INTRAVENOUS | Status: DC
  Filled 2013-05-06: qty 20

## 2013-05-06 MED ORDER — FENTANYL CITRATE 0.05 MG/ML IJ SOLN
INTRAMUSCULAR | Status: AC
  Filled 2013-05-06: qty 2

## 2013-05-06 MED ORDER — LACTATED RINGERS IV SOLN: 500.0000 mL | Freq: Once | INTRAVENOUS | Status: AC | PRN

## 2013-05-06 MED ORDER — GLYCOPYRROLATE 0.2 MG/ML IJ SOLN
INTRAMUSCULAR | Status: DC | PRN
  Administered 2013-05-06: 0.2 mg via INTRAVENOUS

## 2013-05-06 MED ORDER — METHYLPREDNISOLONE SODIUM SUCC 125 MG IJ SOLR
60.0000 mg | Freq: Once | INTRAMUSCULAR | Status: AC
  Administered 2013-05-06: 60 mg via INTRAVENOUS
  Filled 2013-05-06: qty 0.96

## 2013-05-06 MED ORDER — MIDAZOLAM HCL 2 MG/2ML IJ SOLN
INTRAMUSCULAR | Status: AC
  Filled 2013-05-06: qty 2

## 2013-05-06 MED ORDER — VANCOMYCIN HCL IN DEXTROSE 1-5 GM/200ML-% IV SOLN
1000.0000 mg | Freq: Once | INTRAVENOUS | Status: AC
  Administered 2013-05-06: 1000 mg via INTRAVENOUS
  Filled 2013-05-06: qty 200

## 2013-05-06 MED ORDER — ROCURONIUM BROMIDE 100 MG/10ML IV SOLN
INTRAVENOUS | Status: DC | PRN
  Administered 2013-05-06: 50 mg via INTRAVENOUS

## 2013-05-06 MED ORDER — HEPARIN SODIUM (PORCINE) 1000 UNIT/ML IJ SOLN
INTRAMUSCULAR | Status: DC | PRN
  Administered 2013-05-06: 25000 [IU] via INTRAVENOUS
  Administered 2013-05-06: 2000 [IU] via INTRAVENOUS

## 2013-05-06 MED ORDER — FAMOTIDINE IN NACL 20-0.9 MG/50ML-% IV SOLN
20.0000 mg | Freq: Two times a day (BID) | INTRAVENOUS | Status: AC
  Administered 2013-05-06 (×2): 20 mg via INTRAVENOUS
  Filled 2013-05-06: qty 50

## 2013-05-06 MED ORDER — POTASSIUM CHLORIDE 10 MEQ/50ML IV SOLN: 10.0000 meq | INTRAVENOUS | Status: AC

## 2013-05-06 MED ORDER — PHENYLEPHRINE HCL 10 MG/ML IJ SOLN
30.0000 ug/min | INTRAVENOUS | Status: DC
  Filled 2013-05-06: qty 2

## 2013-05-06 MED ORDER — FENTANYL CITRATE 0.05 MG/ML IJ SOLN
INTRAMUSCULAR | Status: DC | PRN
  Administered 2013-05-06 (×2): 50 ug via INTRAVENOUS

## 2013-05-06 MED ORDER — MORPHINE SULFATE 2 MG/ML IJ SOLN
2.0000 mg | INTRAMUSCULAR | Status: DC | PRN
  Administered 2013-05-06 (×2): 2 mg via INTRAVENOUS
  Administered 2013-05-06: 4 mg via INTRAVENOUS
  Administered 2013-05-07: 2 mg via INTRAVENOUS
  Filled 2013-05-06 (×2): qty 2
  Filled 2013-05-06 (×2): qty 1
  Filled 2013-05-06: qty 2

## 2013-05-06 MED ORDER — MIDAZOLAM HCL 2 MG/2ML IJ SOLN: 2.0000 mg | INTRAMUSCULAR | Status: DC | PRN

## 2013-05-06 MED ORDER — BUDESONIDE-FORMOTEROL FUMARATE 160-4.5 MCG/ACT IN AERO
2.0000 | INHALATION_SPRAY | Freq: Two times a day (BID) | RESPIRATORY_TRACT | Status: DC | PRN
  Filled 2013-05-06: qty 6

## 2013-05-06 MED ORDER — VECURONIUM BROMIDE 10 MG IV SOLR
INTRAVENOUS | Status: DC | PRN
  Administered 2013-05-06: 4 mg via INTRAVENOUS
  Administered 2013-05-06 (×5): 2 mg via INTRAVENOUS
  Administered 2013-05-06: 4 mg via INTRAVENOUS

## 2013-05-06 MED ORDER — IPRATROPIUM BROMIDE 0.02 % IN SOLN
0.5000 mg | Freq: Four times a day (QID) | RESPIRATORY_TRACT | Status: DC
  Administered 2013-05-06 – 2013-05-07 (×5): 0.5 mg via RESPIRATORY_TRACT
  Filled 2013-05-06 (×7): qty 2.5

## 2013-05-06 MED ORDER — SODIUM CHLORIDE 0.9 % IV SOLN: 250.0000 mL | INTRAVENOUS | Status: DC

## 2013-05-06 MED ORDER — INSULIN REGULAR BOLUS VIA INFUSION
0.0000 [IU] | Freq: Three times a day (TID) | INTRAVENOUS | Status: DC
  Filled 2013-05-06: qty 10

## 2013-05-06 MED ORDER — SODIUM BICARBONATE 8.4 % IV SOLN
INTRAVENOUS | Status: AC
  Administered 2013-05-06: 50 meq
  Filled 2013-05-06: qty 50

## 2013-05-06 MED ORDER — ASPIRIN EC 325 MG PO TBEC
325.0000 mg | DELAYED_RELEASE_TABLET | Freq: Every day | ORAL | Status: DC
  Administered 2013-05-07 – 2013-05-12 (×6): 325 mg via ORAL
  Filled 2013-05-06 (×6): qty 1

## 2013-05-06 MED ORDER — SODIUM CHLORIDE 0.9 % IV SOLN
INTRAVENOUS | Status: DC
  Administered 2013-05-07: 1.2 [IU]/h via INTRAVENOUS
  Filled 2013-05-06: qty 1

## 2013-05-06 MED ORDER — HEMOSTATIC AGENTS (NO CHARGE) OPTIME
TOPICAL | Status: DC | PRN
  Administered 2013-05-06: 1 via TOPICAL

## 2013-05-06 MED ORDER — OXYCODONE HCL 5 MG PO TABS
5.0000 mg | ORAL_TABLET | ORAL | Status: DC | PRN
  Administered 2013-05-07 (×2): 10 mg via ORAL
  Filled 2013-05-06 (×2): qty 2

## 2013-05-06 MED ORDER — SODIUM CHLORIDE 0.9 % IV SOLN: INTRAVENOUS | Status: DC

## 2013-05-06 MED ORDER — NITROGLYCERIN IN D5W 200-5 MCG/ML-% IV SOLN: 0.0000 ug/min | INTRAVENOUS | Status: DC

## 2013-05-06 MED ORDER — BISACODYL 5 MG PO TBEC
10.0000 mg | DELAYED_RELEASE_TABLET | Freq: Every day | ORAL | Status: DC
  Administered 2013-05-07 – 2013-05-08 (×2): 10 mg via ORAL
  Filled 2013-05-06: qty 2

## 2013-05-06 MED ORDER — METOPROLOL TARTRATE 12.5 MG HALF TABLET
12.5000 mg | ORAL_TABLET | Freq: Two times a day (BID) | ORAL | Status: DC
  Administered 2013-05-07: 12.5 mg via ORAL
  Filled 2013-05-06 (×5): qty 1

## 2013-05-06 MED ORDER — DOCUSATE SODIUM 100 MG PO CAPS
200.0000 mg | ORAL_CAPSULE | Freq: Every day | ORAL | Status: DC
  Administered 2013-05-07 – 2013-05-12 (×4): 200 mg via ORAL
  Filled 2013-05-06 (×6): qty 2

## 2013-05-06 MED ORDER — SODIUM CHLORIDE 0.45 % IV SOLN
INTRAVENOUS | Status: DC
  Administered 2013-05-06: 14:00:00 via INTRAVENOUS

## 2013-05-06 MED ORDER — SODIUM CHLORIDE 0.9 % IJ SOLN
OROMUCOSAL | Status: DC | PRN
  Administered 2013-05-06: 09:00:00 via TOPICAL

## 2013-05-06 MED ORDER — ATORVASTATIN CALCIUM 20 MG PO TABS
20.0000 mg | ORAL_TABLET | Freq: Every day | ORAL | Status: DC
  Administered 2013-05-07 – 2013-05-11 (×5): 20 mg via ORAL
  Filled 2013-05-06 (×6): qty 1

## 2013-05-06 MED ORDER — MAGNESIUM SULFATE 40 MG/ML IJ SOLN
INTRAMUSCULAR | Status: AC
  Filled 2013-05-06: qty 100

## 2013-05-06 SURGICAL SUPPLY — 96 items
APPLIER CLIP 9.375 SM OPEN (CLIP)
ATTRACTOMAT 16X20 MAGNETIC DRP (DRAPES) ×3 IMPLANT
BAG DECANTER FOR FLEXI CONT (MISCELLANEOUS) ×3 IMPLANT
BANDAGE ELASTIC 4 VELCRO ST LF (GAUZE/BANDAGES/DRESSINGS) ×3 IMPLANT
BANDAGE ELASTIC 6 VELCRO ST LF (GAUZE/BANDAGES/DRESSINGS) ×3 IMPLANT
BANDAGE GAUZE ELAST BULKY 4 IN (GAUZE/BANDAGES/DRESSINGS) ×3 IMPLANT
BASKET HEART (ORDER IN 25'S) (MISCELLANEOUS) ×1
BASKET HEART (ORDER IN 25S) (MISCELLANEOUS) ×2 IMPLANT
BLADE STERNUM SYSTEM 6 (BLADE) ×3 IMPLANT
BLADE SURG 15 STRL LF DISP TIS (BLADE) IMPLANT
BLADE SURG 15 STRL SS (BLADE)
BNDG ELASTIC 6X10 VLCR STRL LF (GAUZE/BANDAGES/DRESSINGS) ×3 IMPLANT
CANISTER SUCTION 2500CC (MISCELLANEOUS) ×3 IMPLANT
CANNULA EZ GLIDE AORTIC 21FR (CANNULA) ×3 IMPLANT
CANNULA VENOUS LOW PROF 34X46 (CANNULA) ×3 IMPLANT
CATH CPB KIT HENDRICKSON (MISCELLANEOUS) ×3 IMPLANT
CATH ROBINSON RED A/P 18FR (CATHETERS) ×3 IMPLANT
CATH THORACIC 36FR (CATHETERS) ×3 IMPLANT
CATH THORACIC 36FR RT ANG (CATHETERS) ×3 IMPLANT
CLIP APPLIE 9.375 SM OPEN (CLIP) IMPLANT
CLIP FOGARTY SPRING 6M (CLIP) ×3 IMPLANT
CLIP TI MEDIUM 24 (CLIP) IMPLANT
CLIP TI WIDE RED SMALL 24 (CLIP) IMPLANT
CLOTH BEACON ORANGE TIMEOUT ST (SAFETY) ×6 IMPLANT
COVER MAYO STAND STRL (DRAPES) ×3 IMPLANT
COVER SURGICAL LIGHT HANDLE (MISCELLANEOUS) ×3 IMPLANT
CRADLE DONUT ADULT HEAD (MISCELLANEOUS) ×3 IMPLANT
DRAPE CARDIOVASCULAR INCISE (DRAPES) ×1
DRAPE EXTREMITY T 121X128X90 (DRAPE) IMPLANT
DRAPE PROXIMA HALF (DRAPES) IMPLANT
DRAPE SLUSH/WARMER DISC (DRAPES) ×3 IMPLANT
DRAPE SRG 135X102X78XABS (DRAPES) ×2 IMPLANT
DRSG COVADERM 4X14 (GAUZE/BANDAGES/DRESSINGS) ×3 IMPLANT
ELECT REM PT RETURN 9FT ADLT (ELECTROSURGICAL) ×6
ELECTRODE REM PT RTRN 9FT ADLT (ELECTROSURGICAL) ×4 IMPLANT
GAUZE SPONGE 4X4 16PLY XRAY LF (GAUZE/BANDAGES/DRESSINGS) ×3 IMPLANT
GEL ULTRASOUND 20GR AQUASONIC (MISCELLANEOUS) IMPLANT
GLOVE EUDERMIC 7 POWDERFREE (GLOVE) ×9 IMPLANT
GOWN PREVENTION PLUS XLARGE (GOWN DISPOSABLE) ×6 IMPLANT
GOWN STRL NON-REIN LRG LVL3 (GOWN DISPOSABLE) ×12 IMPLANT
HARMONIC SHEARS 14CM COAG (MISCELLANEOUS) ×3 IMPLANT
HEMOSTAT POWDER SURGIFOAM 1G (HEMOSTASIS) ×9 IMPLANT
HEMOSTAT SURGICEL 2X14 (HEMOSTASIS) ×3 IMPLANT
INSERT FOGARTY XLG (MISCELLANEOUS) IMPLANT
KIT BASIN OR (CUSTOM PROCEDURE TRAY) ×3 IMPLANT
KIT ROOM TURNOVER OR (KITS) ×6 IMPLANT
KIT SUCTION CATH 14FR (SUCTIONS) ×6 IMPLANT
KIT VASOVIEW W/TROCAR VH 2000 (KITS) ×3 IMPLANT
MARKER GRAFT CORONARY BYPASS (MISCELLANEOUS) ×9 IMPLANT
MARKER SKIN DUAL TIP RULER LAB (MISCELLANEOUS) ×3 IMPLANT
NEEDLE 27GAX1X1/2 (NEEDLE) ×3 IMPLANT
NS IRRIG 1000ML POUR BTL (IV SOLUTION) ×15 IMPLANT
PACK OPEN HEART (CUSTOM PROCEDURE TRAY) ×3 IMPLANT
PAD ARMBOARD 7.5X6 YLW CONV (MISCELLANEOUS) ×12 IMPLANT
PAD ELECT DEFIB RADIOL ZOLL (MISCELLANEOUS) ×3 IMPLANT
PENCIL BUTTON HOLSTER BLD 10FT (ELECTRODE) ×3 IMPLANT
PUNCH AORTIC ROTATE 4.0MM (MISCELLANEOUS) IMPLANT
PUNCH AORTIC ROTATE 4.5MM 8IN (MISCELLANEOUS) ×3 IMPLANT
PUNCH AORTIC ROTATE 5MM 8IN (MISCELLANEOUS) IMPLANT
SPONGE GAUZE 4X4 12PLY (GAUZE/BANDAGES/DRESSINGS) ×3 IMPLANT
SUT BONE WAX W31G (SUTURE) ×3 IMPLANT
SUT MNCRL AB 4-0 PS2 18 (SUTURE) IMPLANT
SUT PROLENE 3 0 SH DA (SUTURE) ×3 IMPLANT
SUT PROLENE 4 0 RB 1 (SUTURE)
SUT PROLENE 4 0 SH DA (SUTURE) IMPLANT
SUT PROLENE 4-0 RB1 .5 CRCL 36 (SUTURE) IMPLANT
SUT PROLENE 6 0 C 1 30 (SUTURE) ×15 IMPLANT
SUT PROLENE 7 0 BV 1 (SUTURE) ×6 IMPLANT
SUT PROLENE 7 0 BV1 MDA (SUTURE) ×9 IMPLANT
SUT PROLENE 8 0 BV175 6 (SUTURE) IMPLANT
SUT SILK  1 MH (SUTURE)
SUT SILK 1 MH (SUTURE) IMPLANT
SUT STEEL 6MS V (SUTURE) ×3 IMPLANT
SUT STEEL STERNAL CCS#1 18IN (SUTURE) IMPLANT
SUT STEEL SZ 6 DBL 3X14 BALL (SUTURE) ×3 IMPLANT
SUT TEM PAC WIRE 2 0 SH (SUTURE) ×3 IMPLANT
SUT VIC AB 1 CTX 36 (SUTURE) ×2
SUT VIC AB 1 CTX36XBRD ANBCTR (SUTURE) ×4 IMPLANT
SUT VIC AB 2-0 CT1 27 (SUTURE) ×1
SUT VIC AB 2-0 CT1 TAPERPNT 27 (SUTURE) ×2 IMPLANT
SUT VIC AB 2-0 CTX 27 (SUTURE) IMPLANT
SUT VIC AB 3-0 SH 27 (SUTURE)
SUT VIC AB 3-0 SH 27X BRD (SUTURE) IMPLANT
SUT VIC AB 3-0 X1 27 (SUTURE) ×3 IMPLANT
SUT VICRYL 4-0 PS2 18IN ABS (SUTURE) IMPLANT
SUTURE E-PAK OPEN HEART (SUTURE) ×3 IMPLANT
SYR 50ML SLIP (SYRINGE) IMPLANT
SYSTEM SAHARA CHEST DRAIN ATS (WOUND CARE) ×3 IMPLANT
TAPE CLOTH SOFT 2X10 (GAUZE/BANDAGES/DRESSINGS) ×3 IMPLANT
TOWEL OR 17X24 6PK STRL BLUE (TOWEL DISPOSABLE) ×6 IMPLANT
TOWEL OR 17X26 10 PK STRL BLUE (TOWEL DISPOSABLE) ×6 IMPLANT
TRAY FOLEY IC TEMP SENS 14FR (CATHETERS) ×3 IMPLANT
TUBE FEEDING 8FR 16IN STR KANG (MISCELLANEOUS) ×3 IMPLANT
TUBING INSUFFLATION 10FT LAP (TUBING) ×3 IMPLANT
UNDERPAD 30X30 INCONTINENT (UNDERPADS AND DIAPERS) ×3 IMPLANT
WATER STERILE IRR 1000ML POUR (IV SOLUTION) ×6 IMPLANT

## 2013-05-06 NOTE — Progress Notes (Signed)
Respiratory therapy note- high PIP's and PAD's, BBS diminished. Xopenex 0.63mg  ordered q6 and given. Improved PIP's and BBS. PAD's continue to be elevated, RN aware.

## 2013-05-06 NOTE — Progress Notes (Signed)
S/p CABG  Intubated, sedated  BP 108/65  Pulse 79  Temp(Src) 99.1 F (37.3 C) (Oral)  Resp 15  Wt 176 lb 12.9 oz (80.2 kg)  BMI 29.42 kg/m2  SpO2 100%  Ci= 1.9   Intake/Output Summary (Last 24 hours) at 05/06/13 1917 Last data filed at 05/06/13 1900  Gross per 24 hour  Intake 4578.92 ml  Output   3260 ml  Net 1318.92 ml   Minimal CT output  Some rhonchi on exam- better after nebs Will give solumedrol x 1- hopefully can extubated this evening

## 2013-05-06 NOTE — Brief Op Note (Addendum)
05/06/2013  12:00 PM  PATIENT:  Douglas Christensen  62 y.o. male  PRE-OPERATIVE DIAGNOSIS:  cad  POST-OPERATIVE DIAGNOSIS:  Coronary artery disease  PROCEDURE:  Procedure(s): CORONARY ARTERY BYPASS GRAFTING (CABG)X 5 LIMA-LAD; SEQ SVG-PD-PL; SVG-DIAG; SVG-OM EVH LEFT LEG  SURGEON:  Surgeon(s): Melrose Nakayama, MD  PHYSICIAN ASSISTANT: WAYNE GOLD PA-C  ANESTHESIA:   general  PATIENT CONDITION:  ICU - intubated and hemodynamically stable.  PRE-OPERATIVE WEIGHT: 99991111  COMPLICATIONS: NO KNOWN  XC= 103 min PT 150 min  Fair target vessels, good conduits

## 2013-05-06 NOTE — Preoperative (Signed)
Beta Blockers   Reason not to administer Beta Blockers:Not Applicable 

## 2013-05-06 NOTE — OR Nursing (Signed)
Volunteer call for patient off pump at 12:52.  SICU called for 1st call at 12:54

## 2013-05-06 NOTE — Anesthesia Preprocedure Evaluation (Signed)
Anesthesia Evaluation  Patient identified by MRN, date of birth, ID band Patient awake    Reviewed: Allergy & Precautions, H&P , NPO status , Patient's Chart, lab work & pertinent test results  Airway Mallampati: I TM Distance: >3 FB Neck ROM: Full    Dental   Pulmonary shortness of breath, sleep apnea , COPDCurrent Smoker,  + rhonchi         Cardiovascular hypertension, + CAD and + Peripheral Vascular Disease Rhythm:Regular Rate:Normal     Neuro/Psych  Neuromuscular disease    GI/Hepatic GERD-  ,  Endo/Other    Renal/GU      Musculoskeletal   Abdominal   Peds  Hematology   Anesthesia Other Findings   Reproductive/Obstetrics                           Anesthesia Physical Anesthesia Plan  ASA: III  Anesthesia Plan: General   Post-op Pain Management:    Induction: Intravenous  Airway Management Planned: Oral ETT  Additional Equipment: PA Cath, TEE, Arterial line and 3D TEE  Intra-op Plan:   Post-operative Plan: Post-operative intubation/ventilation  Informed Consent: I have reviewed the patients History and Physical, chart, labs and discussed the procedure including the risks, benefits and alternatives for the proposed anesthesia with the patient or authorized representative who has indicated his/her understanding and acceptance.     Plan Discussed with: CRNA and Surgeon  Anesthesia Plan Comments:         Anesthesia Quick Evaluation

## 2013-05-06 NOTE — Progress Notes (Signed)
Utilization Review Completed.Donne Anon T6/07/2013

## 2013-05-06 NOTE — H&P (View-Only) (Signed)
PCP is [name], MD Referring Provider is Sherren Mocha, MD  Chief Complaint  Patient presents with  . Coronary Artery Disease    surgical eval, cardiac cath 05/02/13     HPI: 62 year old presents with a chief complaint of shortness of breath with exertion.  Douglas Christensen is a 62 year old gentleman with a history of mild coronary disease, heavy tobacco abuse, peripheral arterial disease, hypertension, and hyperlipidemia. He has been having problems with shortness of breath with exertion for several years now. For a long time he would only have it with heavy exertion. However, over the last several months he has been having it about 2-3 times a week at rest. He says he has woken up in the middle the night a couple of times feeling that he was going to die. He denies any chest, neck or arm pain associated with the shortness of breath.  He's had extensive workup and was seen by Dr. Melvyn Novas. Pulmonary function testing showed FEV1 and FEC in the 75% range and diffusion capacity in the 80% range. Cardiac workup was done. An exercise Myoview was a moderate risk study with inferior ischemia. Cardiac catheterization was done yesterday and revealed severe three-vessel coronary disease not amenable to percutaneous intervention.   Past Medical History  Diagnosis Date  . History of colonic polyps   . COPD (chronic obstructive pulmonary disease)   . CAD (coronary artery disease)     Mild plaque (cath "years ago")  . GERD (gastroesophageal reflux disease)   . Hyperlipidemia   . Hypertension   . Vitamin D deficiency   . Meralgia paresthetica of left side 2011  . LBP (low back pain)   . PVD (peripheral vascular disease)     Stent to left common femoral and right superficial femoral.  2008.  50%  left renal   . Sleep apnea     mod OSA, central sleep apnea/hypoapnea syndrome 11/22/12    Past Surgical History  Procedure Laterality Date  . None    . Lower extremity stents      bilateral lower  extremities x 2  . Cardiac catheterization    . Femoral-popliteal bypass graft  12/27/2012    Procedure: BYPASS GRAFT FEMORAL-POPLITEAL ARTERY;  Surgeon: Serafina Mitchell, MD;  Location: [facility] OR;  Service: Vascular;  Laterality: Right;  using non-reversed sapphenous vein.    Family History  Problem Relation Age of Onset  . Heart disease Mother     No clear CAD  . Hypertension Mother   . Cancer Mother     ? colon ca  . Hyperlipidemia Mother   . Cancer Father     brain tumor  . Alzheimer's disease Father   . Colon cancer Neg Hx     Social History History  Substance Use Topics  . Smoking status: Former Smoker -- 2.00 packs/day for 47 years    Types: Cigarettes    Quit date: 12/27/2012  . Smokeless tobacco: Never Used  . Alcohol Use: 7.2 oz/week    2 Cans of beer, 10 Shots of liquor per week    Current Outpatient Prescriptions  Medication Sig Dispense Refill  . aspirin 81 MG tablet Take 81 mg by mouth daily.      Marland Kitchen atenolol (TENORMIN) 50 MG tablet Take 1 tablet (50 mg total) by mouth daily.  90 tablet  1  . atorvastatin (LIPITOR) 20 MG tablet Take 1 tablet by mouth daily.      . budesonide-formoterol (SYMBICORT) 160-4.5 MCG/ACT inhaler Take 2  puffs first thing in am and then another 2 puffs about 12 hours later.  1 Inhaler  11  . cholecalciferol (VITAMIN D) 1000 UNITS tablet Take 1,000 Units by mouth daily.        Marland Kitchen losartan-hydrochlorothiazide (HYZAAR) 100-25 MG per tablet Take 1 tablet by mouth daily.  90 tablet  3  . ranitidine (ZANTAC) 150 MG tablet Take 150 mg by mouth daily as needed. For heartburn       . sildenafil (VIAGRA) 100 MG tablet Take 100 mg by mouth daily as needed. For erectile dysfunction      . STUDY MEDICATION Take 1 tablet by mouth daily. "[employer]      . STUDY MEDICATION Take 1 tablet by mouth 2 (two) times daily. "[employer]      . vitamin B-12 (CYANOCOBALAMIN) 1000 MCG tablet Take 1,000 mcg by mouth daily.       No  current facility-administered medications for this visit.    Allergies  Allergen Reactions  . Benazepril     cough    Review of Systems  Constitutional: Positive for activity change and fatigue.  Respiratory: Positive for cough and shortness of breath. Negative for chest tightness.   Cardiovascular: Negative for chest pain and leg swelling.       Claudication improved after femoropopliteal bypass in January  Neurological: Negative.   Hematological: Does not bruise/bleed easily.  All other systems reviewed and are negative.    BP 136/88  Pulse 100  Resp 20  Ht 5\' 5"  (1.651 m)  Wt 165 lb (74.844 kg)  BMI 27.46 kg/m2  SpO2 98% Physical Exam  Vitals reviewed. Constitutional: He is oriented to person, place, and time. He appears well-developed and well-nourished. No distress.  HENT:  Head: Normocephalic and atraumatic.  Eyes: EOM are normal. Pupils are equal, round, and reactive to light.  Neck: Neck supple. No thyromegaly present.  No carotid bruits  Cardiovascular: Normal rate, regular rhythm and normal heart sounds.  Exam reveals no gallop and no friction rub.   No murmur heard. Unable to palpate lower extremity pulses  Pulmonary/Chest: Effort normal and breath sounds normal. He has no wheezes. He has no rales.  Abdominal: Soft. There is no tenderness.  Musculoskeletal: He exhibits no edema.  Healed surgical scars right leg  Lymphadenopathy:    He has no cervical adenopathy.  Neurological: He is alert and oriented to person, place, and time. No cranial nerve deficit.  Skin: Skin is warm and dry.   normal Allen's test left arm   Diagnostic Tests: Cardiac catheterization Procedural Findings:  Hemodynamics:  AO 87/45 mean 62  LV 90/19  Coronary angiography:  Coronary dominance: right  Left mainstem: The left main is calcified. There is 40-50% stenosis of the proximal and mid body of the left main.  Left anterior descending (LAD): The LAD is heavily calcified. The  proximal LAD has diffuse 70% stenosis before the bifurcation of the LAD and first diagonal. There is severe bifurcational stenosis with heavy calcification. The diagonal branch has a 75% ostial stenosis. The mid LAD immediately beyond the diagonal origin has severe 90-95% stenosis. There is diffuse segmental disease throughout the mid LAD. The distal vessel is patent with minor luminal irregularity. The diagonal branch and LAD appear to be adequate targets for bypass.  Left circumflex (LCx): The left circumflex is mildly calcified. There is diffuse 60-70% stenosis throughout the proximal circumflex. The obtuse marginal branches are relatively small in caliber without  significant stenosis. The second OM is very small.  Right coronary artery (RCA): The right coronary artery is severely calcified. The vessel is severely diseased through the proximal aspect. The mid segment is totally occluded. The PDA and PLA branches fill from left to right collaterals.  Left ventriculography: Left ventricular systolic function is normal, LVEF is estimated at 55-65%, there is no significant mitral regurgitation  Final Conclusions:  1. Multivessel coronary artery disease with total occlusion of the right coronary artery and left to right collaterals, severe calcific bifurcational stenosis of the proximal LAD, and mild to moderate stenosis of the left main and left circumflex  2. Normal left ventricular systolic function  Recommendations: The patient has severe multivessel disease with moderate ischemia on stress testing. He is right coronary artery is not approachable percutaneously. His LAD involves the bifurcation with heavy calcification. He's had previous femoropopliteal bypass on the right, but hopefully has suitable vein on the left for grafting. I'm going to refer him to cardiac surgery for consideration of CABG. He has been enrolled in the [facility] Trial which randomizes him to antiplatelet monotherapy with either brilinta  or clopidogrel. I will take him off of study drug and start him on aspirin 81 mg daily in anticipation of cardiac surgery.   Impression: 62 year old gentleman with multiple cardiac risk factors and known atherosclerotic cardiovascular disease. He presents with progressive shortness of breath with exertion and now is having symptoms at rest as well. A long smoking history however his pulmonary function testing is certainly not severe enough to account for his dyspnea. This most likely is due to his coronary disease. His cardiac workup has revealed severe three-vessel coronary disease with preserved left ventricular function. Coronary bypass grafting is indicated for survival benefit. I suspect he will also have relief of symptoms as well.  It was noted the time of his femoral-popliteal bypass that is vena from his right leg was suboptimal in quality. We'll plan to use his left mammary. We will also likely use the left radial artery as well as he does have a normal Allen's test. We'll also plan to use vein from the left leg if it is acceptable.  He had a carotid duplex in January which showed no significant stenoses.  Pulmonary function testing showed an FEV1 of 2.55 (74%) and an FVC of 3.53 (73% ), DLCO 82%  I discussed with him the general nature of the procedure, the need for general anesthesia, and the incisions to be used. We we reviewed the indications, risks, benefits, and alternatives. We discussed the expected hospital stay, overall recovery and short and long term outcomes. He understands the risks include but are not limited to death, stroke, MI, DVT/PE, bleeding, possible need for transfusion, infections, cardiac arrhythmias, and other organ system dysfunction including respiratory, renal, or GI complications. He accepts these risks and agrees to proceed.  Plan: Coronary artery bypass grafting with possible left radial artery harvest on Monday, 05/06/2013

## 2013-05-06 NOTE — Progress Notes (Signed)
ABG never resulted from 05/03/13 draw.  ABG redrawn in short stay.

## 2013-05-06 NOTE — Interval H&P Note (Signed)
History and Physical Interval Note:  05/06/2013 7:31 AM  Douglas Christensen  has presented today for surgery, with the diagnosis of cad  The various methods of treatment have been discussed with the patient and family. After consideration of risks, benefits and other options for treatment, the patient has consented to  Procedure(s): CORONARY ARTERY BYPASS GRAFTING (CABG) (N/A) RADIAL ARTERY HARVEST (Left) as a surgical intervention .  The patient's history has been reviewed, patient examined, no change in status, stable for surgery.  I have reviewed the patient's chart and labs.  Questions were answered to the patient's satisfaction.     Kalyn Dimattia C

## 2013-05-06 NOTE — Transfer of Care (Signed)
Immediate Anesthesia Transfer of Care Note  Patient: Douglas Christensen  Procedure(s) Performed: Procedure(s) with comments: CORONARY ARTERY BYPASS GRAFTING (CABG) (N/A) - Coronary artery bypass graft times five using left internal mammary artery and left greater saphenous vein via endovein harvest.  Patient Location: PACU and SICU  Anesthesia Type:General  Level of Consciousness: sedated  Airway & Oxygen Therapy: Patient remains intubated per anesthesia plan and Patient placed on Ventilator (see vital sign flow sheet for setting)  Post-op Assessment: Report given to PACU RN and Post -op Vital signs reviewed and stable  Post vital signs: Reviewed and stable  Complications: No apparent anesthesia complications

## 2013-05-06 NOTE — Anesthesia Postprocedure Evaluation (Signed)
  Anesthesia Post-op Note  Patient: Douglas Christensen  Procedure(s) Performed: Procedure(s) with comments: CORONARY ARTERY BYPASS GRAFTING (CABG) (N/A) - Coronary artery bypass graft times five using left internal mammary artery and left greater saphenous vein via endovein harvest.  Patient Location: PACU and ICU  Anesthesia Type:General  Level of Consciousness: Patient remains intubated per anesthesia plan  Airway and Oxygen Therapy: Patient remains intubated per anesthesia plan and Patient placed on Ventilator (see vital sign flow sheet for setting)  Post-op Pain: none  Post-op Assessment: Post-op Vital signs reviewed, Patient's Cardiovascular Status Stable, Respiratory Function Stable, Patent Airway, No signs of Nausea or vomiting and Pain level controlled  Post-op Vital Signs: stable  Complications: No apparent anesthesia complications

## 2013-05-06 NOTE — Progress Notes (Signed)
Respiratory therapy note- Placed to full support post ABG for weaning, acidotic.

## 2013-05-07 ENCOUNTER — Encounter (HOSPITAL_COMMUNITY): Payer: Self-pay | Admitting: Thoracic Surgery (Cardiothoracic Vascular Surgery)

## 2013-05-07 ENCOUNTER — Inpatient Hospital Stay (HOSPITAL_COMMUNITY): Payer: 59

## 2013-05-07 LAB — CBC
HCT: 30 % — ABNORMAL LOW (ref 39.0–52.0)
MCH: 33.1 pg (ref 26.0–34.0)
MCH: 33.7 pg (ref 26.0–34.0)
MCV: 93.8 fL (ref 78.0–100.0)
Platelets: 149 10*3/uL — ABNORMAL LOW (ref 150–400)
RBC: 3.2 MIL/uL — ABNORMAL LOW (ref 4.22–5.81)
RBC: 2.85 MIL/uL — ABNORMAL LOW (ref 4.22–5.81)
WBC: 9.9 10*3/uL (ref 4.0–10.5)

## 2013-05-07 LAB — BASIC METABOLIC PANEL
BUN: 22 mg/dL (ref 6–23)
CO2: 22 mEq/L (ref 19–32)
Calcium: 7.7 mg/dL — ABNORMAL LOW (ref 8.4–10.5)
Chloride: 107 mEq/L (ref 96–112)
Creatinine, Ser: 1.35 mg/dL (ref 0.50–1.35)

## 2013-05-07 LAB — POCT I-STAT, CHEM 8
Creatinine, Ser: 1.3 mg/dL (ref 0.50–1.35)
HCT: 29 % — ABNORMAL LOW (ref 39.0–52.0)
Hemoglobin: 9.9 g/dL — ABNORMAL LOW (ref 13.0–17.0)
Potassium: 4.3 mEq/L (ref 3.5–5.1)
Sodium: 138 mEq/L (ref 135–145)

## 2013-05-07 LAB — POCT I-STAT 3, ART BLOOD GAS (G3+)
Acid-base deficit: 3 mmol/L — ABNORMAL HIGH (ref 0.0–2.0)
Acid-base deficit: 2 mmol/L (ref 0.0–2.0)
O2 Saturation: 96 %
Patient temperature: 37.5
TCO2: 24 mmol/L (ref 0–100)
pH, Arterial: 7.372 (ref 7.350–7.450)

## 2013-05-07 LAB — BLOOD GAS, ARTERIAL
Bicarbonate: 19 mEq/L — ABNORMAL LOW (ref 20.0–24.0)
Drawn by: 344381
FIO2: 0.21 %
O2 Saturation: 97.8 %
pCO2 arterial: 27.8 mmHg — ABNORMAL LOW (ref 35.0–45.0)
pO2, Arterial: 96.6 mmHg (ref 80.0–100.0)

## 2013-05-07 LAB — GLUCOSE, CAPILLARY
Glucose-Capillary: 141 mg/dL — ABNORMAL HIGH (ref 70–99)
Glucose-Capillary: 165 mg/dL — ABNORMAL HIGH (ref 70–99)
Glucose-Capillary: 144 mg/dL — ABNORMAL HIGH (ref 70–99)
Glucose-Capillary: 136 mg/dL — ABNORMAL HIGH (ref 70–99)

## 2013-05-07 LAB — CREATININE, SERUM: Creatinine, Ser: 1.38 mg/dL — ABNORMAL HIGH (ref 0.50–1.35)

## 2013-05-07 LAB — MAGNESIUM: Magnesium: 2 mg/dL (ref 1.5–2.5)

## 2013-05-07 MED ORDER — ALBUMIN HUMAN 5 % IV SOLN
12.5000 g | Freq: Once | INTRAVENOUS | Status: AC
  Administered 2013-05-07: 12.5 g via INTRAVENOUS

## 2013-05-07 MED ORDER — CHLORDIAZEPOXIDE HCL 25 MG PO CAPS
25.0000 mg | ORAL_CAPSULE | Freq: Once | ORAL | Status: DC
  Filled 2013-05-07: qty 1

## 2013-05-07 MED ORDER — INSULIN DETEMIR 100 UNIT/ML ~~LOC~~ SOLN
25.0000 [IU] | Freq: Every day | SUBCUTANEOUS | Status: DC
  Administered 2013-05-07: 25 [IU] via SUBCUTANEOUS
  Filled 2013-05-07 (×2): qty 0.25

## 2013-05-07 MED ORDER — ONDANSETRON 4 MG PO TBDP
4.0000 mg | ORAL_TABLET | Freq: Four times a day (QID) | ORAL | Status: AC | PRN
  Filled 2013-05-07 (×2): qty 1

## 2013-05-07 MED ORDER — CHLORDIAZEPOXIDE HCL 25 MG PO CAPS
25.0000 mg | ORAL_CAPSULE | Freq: Three times a day (TID) | ORAL | Status: AC
  Administered 2013-05-09 (×2): 25 mg via ORAL
  Filled 2013-05-07 (×2): qty 1

## 2013-05-07 MED ORDER — POTASSIUM CHLORIDE 10 MEQ/50ML IV SOLN
10.0000 meq | INTRAVENOUS | Status: AC
  Administered 2013-05-07 (×3): 10 meq via INTRAVENOUS

## 2013-05-07 MED ORDER — LOPERAMIDE HCL 2 MG PO CAPS
2.0000 mg | ORAL_CAPSULE | ORAL | Status: AC | PRN
  Filled 2013-05-07: qty 2

## 2013-05-07 MED ORDER — ENOXAPARIN SODIUM 40 MG/0.4ML ~~LOC~~ SOLN
40.0000 mg | Freq: Every day | SUBCUTANEOUS | Status: DC
  Administered 2013-05-07 – 2013-05-11 (×5): 40 mg via SUBCUTANEOUS
  Filled 2013-05-07 (×6): qty 0.4

## 2013-05-07 MED ORDER — ENOXAPARIN SODIUM 30 MG/0.3ML ~~LOC~~ SOLN: 40.0000 mg | SUBCUTANEOUS | Status: DC

## 2013-05-07 MED ORDER — CHLORDIAZEPOXIDE HCL 25 MG PO CAPS
25.0000 mg | ORAL_CAPSULE | Freq: Four times a day (QID) | ORAL | Status: AC
  Administered 2013-05-07 – 2013-05-08 (×4): 25 mg via ORAL
  Filled 2013-05-07 (×2): qty 1

## 2013-05-07 MED ORDER — TRAMADOL HCL 50 MG PO TABS
50.0000 mg | ORAL_TABLET | Freq: Four times a day (QID) | ORAL | Status: DC | PRN
  Administered 2013-05-07 (×2): 50 mg via ORAL
  Administered 2013-05-08: 100 mg via ORAL
  Filled 2013-05-07: qty 2
  Filled 2013-05-07 (×2): qty 1

## 2013-05-07 MED ORDER — CHLORDIAZEPOXIDE HCL 25 MG PO CAPS
25.0000 mg | ORAL_CAPSULE | Freq: Four times a day (QID) | ORAL | Status: DC | PRN
  Administered 2013-05-08: 25 mg via ORAL
  Filled 2013-05-07 (×3): qty 1

## 2013-05-07 MED ORDER — CHLORDIAZEPOXIDE HCL 25 MG PO CAPS
25.0000 mg | ORAL_CAPSULE | ORAL | Status: AC
  Administered 2013-05-09 – 2013-05-10 (×2): 25 mg via ORAL
  Filled 2013-05-07: qty 1

## 2013-05-07 MED ORDER — FUROSEMIDE 10 MG/ML IJ SOLN
20.0000 mg | Freq: Two times a day (BID) | INTRAMUSCULAR | Status: AC
  Administered 2013-05-07 (×2): 20 mg via INTRAVENOUS
  Filled 2013-05-07 (×2): qty 2

## 2013-05-07 MED ORDER — CHLORDIAZEPOXIDE HCL 25 MG PO CAPS: 25.0000 mg | ORAL_CAPSULE | Freq: Every day | ORAL | Status: DC

## 2013-05-07 MED ORDER — ADULT MULTIVITAMIN W/MINERALS CH
1.0000 | ORAL_TABLET | Freq: Every day | ORAL | Status: DC
  Administered 2013-05-08 – 2013-05-12 (×4): 1 via ORAL
  Filled 2013-05-07 (×6): qty 1

## 2013-05-07 MED ORDER — INSULIN ASPART 100 UNIT/ML ~~LOC~~ SOLN
0.0000 [IU] | SUBCUTANEOUS | Status: DC
  Administered 2013-05-07 – 2013-05-08 (×4): 2 [IU] via SUBCUTANEOUS

## 2013-05-07 MED ORDER — HYDROXYZINE HCL 25 MG PO TABS
25.0000 mg | ORAL_TABLET | Freq: Four times a day (QID) | ORAL | Status: AC | PRN
  Filled 2013-05-07: qty 1

## 2013-05-07 MED ORDER — THIAMINE HCL 100 MG/ML IJ SOLN
100.0000 mg | Freq: Once | INTRAMUSCULAR | Status: AC
  Administered 2013-05-07: 22:00:00 via INTRAMUSCULAR
  Filled 2013-05-07: qty 1

## 2013-05-07 MED ORDER — VITAMIN B-1 100 MG PO TABS
100.0000 mg | ORAL_TABLET | Freq: Every day | ORAL | Status: DC
  Administered 2013-05-08 – 2013-05-10 (×3): 100 mg via ORAL
  Filled 2013-05-07 (×3): qty 1

## 2013-05-07 MED FILL — Heparin Sodium (Porcine) Inj 1000 Unit/ML: INTRAMUSCULAR | Qty: 10 | Status: AC

## 2013-05-07 MED FILL — Sodium Chloride Irrigation Soln 0.9%: Qty: 3000 | Status: AC

## 2013-05-07 MED FILL — Heparin Sodium (Porcine) Inj 1000 Unit/ML: INTRAMUSCULAR | Qty: 30 | Status: AC

## 2013-05-07 MED FILL — Lidocaine HCl IV Inj 20 MG/ML: INTRAVENOUS | Qty: 5 | Status: AC

## 2013-05-07 MED FILL — Potassium Chloride Inj 2 mEq/ML: INTRAVENOUS | Qty: 40 | Status: AC

## 2013-05-07 MED FILL — Mannitol IV Soln 20%: INTRAVENOUS | Qty: 500 | Status: AC

## 2013-05-07 MED FILL — Magnesium Sulfate Inj 50%: INTRAMUSCULAR | Qty: 10 | Status: AC

## 2013-05-07 MED FILL — Sodium Chloride IV Soln 0.9%: INTRAVENOUS | Qty: 1000 | Status: AC

## 2013-05-07 MED FILL — Electrolyte-R (PH 7.4) Solution: INTRAVENOUS | Qty: 3000 | Status: AC

## 2013-05-07 MED FILL — Sodium Bicarbonate IV Soln 8.4%: INTRAVENOUS | Qty: 100 | Status: AC

## 2013-05-07 NOTE — Progress Notes (Signed)
1 Day Post-Op Procedure(s) (LRB): CORONARY ARTERY BYPASS GRAFTING (CABG) (N/A) Subjective: No complaints, denies pain  Objective: Vital signs in last 24 hours: Temp:  [96.1 F (35.6 C)-99.7 F (37.6 C)] 99 F (37.2 C) (06/10 0700) Pulse Rate:  [78-83] 81 (06/10 0700) Cardiac Rhythm:  [-] Atrial paced (06/10 0700) Resp:  [11-18] 12 (06/10 0700) BP: (96-124)/(51-79) 109/60 mmHg (06/10 0700) SpO2:  [92 %-100 %] 92 % (06/10 0700) Arterial Line BP: (61-147)/(37-77) 100/52 mmHg (06/10 0700) FiO2 (%):  [40 %-60 %] 40 % (06/10 0159) Weight:  [186 lb 6.4 oz (84.55 kg)] 186 lb 6.4 oz (84.55 kg) (06/10 0500)  Hemodynamic parameters for last 24 hours: PAP: (23-59)/(14-39) 33/22 mmHg CO:  [3.2 L/min-4 L/min] 3.4 L/min CI:  [1.9 L/min/m2-2.4 L/min/m2] 2 L/min/m2  Intake/Output from previous day: 06/09 0701 - 06/10 0700 In: 5553.7 [I.V.:4123.7; Blood:350; NG/GT:30; IV Piggyback:1050] Out: [id number] [Urine:3960; Chest Tube:620] Intake/Output this shift:    General appearance: alert and no distress Neurologic: intact Heart: regular rate and rhythm Lungs: diminished breath sounds bibasilar Abdomen: normal findings: soft, non-tender  Lab Results:  Recent Labs  05/06/13 2015 05/07/13 0450  WBC 11.4* 9.9  HGB 10.5* 10.6*  HCT 29.7* 30.0*  PLT 144* 142*   BMET:  Recent Labs  05/06/13 2004 05/06/13 2015 05/07/13 0450  NA 138  --  140  K 4.6  --  3.9  CL 107  --  107  CO2  --   --  22  GLUCOSE 111*  --  166*  BUN 21  --  22  CREATININE 1.20 1.24 1.35  CALCIUM  --   --  7.7*    PT/INR:  Recent Labs  05/06/13 1432  LABPROT 16.3*  INR 1.34   ABG    Component Value Date/Time   PHART 7.372 05/07/2013 0430   HCO3 22.5 05/07/2013 0430   TCO2 24 05/07/2013 0430   ACIDBASEDEF 2.0 05/07/2013 0430   O2SAT 92.0 05/07/2013 0430   CBG (last 3)   Recent Labs  05/07/13 0110 05/07/13 0405 05/07/13 0734  GLUCAP 141* 160* 181*    Assessment/Plan: S/P Procedure(s)  (LRB): CORONARY ARTERY BYPASS GRAFTING (CABG) (N/A) POD # 1 CABG CV- good index, SR with 1st degree block  BP relatively low- wean neo as BP allows, albumin this AM  RESP- IS, nebs  RENAL- UO & creatinine OK, diurese as BP allows  ENDO- not diabetic but CBG elevated- insulin gtt restarted, transition to levemir  Anemia- secondary to ABL- mild, follow  DC CT  OOB, ambulate   LOS: 1 day    HENDRICKSON,STEVEN C 05/07/2013

## 2013-05-07 NOTE — Op Note (Signed)
[name], Douglas Christensen NO.:  [id number]  MEDICAL RECORD NO.:  [id number]  LOCATION:  2307                         FACILITY:  [facility]  PHYSICIAN:  Revonda Standard. Roxan Hockey, M.D.DATE OF BIRTH:  Apr 20, 1951  DATE OF PROCEDURE: DATE OF DISCHARGE:                              OPERATIVE REPORT   PREOPERATIVE DIAGNOSIS:  Severe three-vessel coronary disease.  POSTOPERATIVE DIAGNOSIS:  Severe three-vessel coronary disease.  PROCEDURE:  Median sternotomy, extracorporeal circulation, coronary artery bypass grafting x5 (left internal mammary artery to LAD, saphenous vein graft to first diagonal, saphenous vein graft to obtuse marginal 1, sequential saphenous vein graft to posterior descending and posterior lateral), endoscopic vein harvest, left leg.  SURGEON:  Revonda Standard. Roxan Hockey, MD  ASSISTANT:  John Giovanni, PA-C  ANESTHESIA:  General.  FINDINGS:  Good quality conduits, fair quality targets. Left ventricular hypertrophy and generalized cardiomegaly. Preserved left ventricular systolic function.  CLINICAL NOTE:  Douglas Christensen is a 62 year old gentleman with multiple cardiac risk factors and known atherosclerotic cardiovascular disease. He presents with progressive shortness of breath on exertion to the point now he is having symptoms at rest.  Evaluation revealed severe three-vessel coronary disease, and it was felt that the shortness of breath represented anginal equivalent.  He would be class 4 based on that.  He was advised to undergo coronary artery bypass grafting. Indications, risks, benefits, and alternatives were discussed in detail with the patient.  He understood and accepted the risks of the procedure and wished to proceed.  OPERATIVE NOTE:  Douglas Christensen was brought to the preoperative holding area on May 06, 2013.  Anesthesia placed a Swan-Ganz catheter and arterial blood pressure monitoring line.  Intravenous antibiotics were administered.  He was taken to  the operating room, anesthetized, and intubated.  A Foley catheter was placed.  The chest, abdomen, and legs were prepped and draped in usual sterile fashion.  Incision was made in the medial aspect of the left leg at the level of the knee.  The greater saphenous vein was identified and harvested from midcalf to groin, the saphenous vein was of good quality.  Simultaneously with this, a median sternotomy was performed.  The left internal mammary artery was harvested using standard technique.  This was difficult due to the patient's body habitus, but the mammary artery was a good quality vessel.  2000 units of Heparin was administered during the vessel harvest.  The remainder of full heparin dose was given prior to opening the pericardium.  The pericardium was opened.  The ascending aorta was inspected.  There was no atherosclerotic disease at the site of cannulation.  The aorta was cannulated via concentric 2-0 Ethibond pledgeted purse-string sutures after confirming adequate anticoagulation with ACT measurement. A dual-stage venous cannula was placed via purse-string suture in the right atrial appendage.  Cardiopulmonary bypass was instituted.  The patient was cooled to 32 degrees Celsius.  The coronary arteries were inspected and anastomotic sites were chosen.  The conduits were inspected and cut to length.  A foam pad was placed in the pericardium to insulate the heart and protect the left phrenic nerve.  A temperature probe was placed in myocardial  septum and a cardioplegic cannula was placed in the ascending aorta.  The aorta was crossclamped.  The left ventricle was emptied via the aortic root vent.  Cardiac arrest then was achieved with combination of cold antegrade blood cardioplegia and topical iced saline.  After achieving a complete diastolic arrest and adequate myocardial septal Cooling, the following distal anastomoses were performed.  First, a reversed saphenous vein  graft was placed sequentially to the posterior descending and posterior lateral vessels.  All the targets were fair in quality.  The saphenous vein was good quality.  A side-to-side anastomosis was performed to the posterior descending and end-to-side to the posterior lateral.  All anastomoses were probed proximally and distally at their completion to ensure patency.  Both these vessels accepted a 1.5-mm probe through the anastomosis.  At the completion of the anastomosis cardioplegia was administered. There was good flow and good hemostasis.  Next, a reversed saphenous vein graft was placed end-to-side from the first diagonal branch to the LAD.  This vessel was of fair quality, diffusely diseased.  The vein was good quality.  The anastomosis was performed end-to-side with running 7-0 Prolene suture.  Next, a reversed saphenous vein graft was placed end-to-side to OM1, this vessel accepted a 1.5-mm probe proximally, but only a 1-mm probe distally.  It was a fair quality.  The vein was anastomosed end-to-side with a running 7-0 Prolene suture.  Again, there was good flow and good hemostasis.  Additional cardioplegia was administered down the aortic root.  The left internal mammary artery then was brought through a window in the pericardium.  The distal end was beveled.  It was then anastomosed end- to-side to the distal LAD.  The LAD was a fair quality target with some lateral plaquing at the site of the anastomosis, but a 1.5-mm probe did pass easily to the apex.  The mammary was a 2-mm good quality conduit and an end-to-side anastomosis was performed with a running 8-0 Prolene suture.  At completion of the mammary to LAD anastomosis, the bulldog clamp was briefly removed.  Immediate and rapid septal rewarming was noted. The bulldog clamp was replaced and the mammary pedicle was tacked to the epicardial surface of the heart with 6-0 Prolene sutures.  Additional cardioplegia was  administered.  The vein grafts were cut to length.  The proximal vein graft anastomoses were performed to 4.5 mm punch aortotomies with running 6-0 Prolene sutures.  At the completion of the final proximal anastomosis, the patient was placed in Trendelenburg position.  Lidocaine was administered.  The aortic root was de-aired and the aortic crossclamp was removed.  Total crossclamp time was 103 minutes.  The patient was initially in heart block, and subsequently resumed a bradycardic rhythm and required pacing.  While rewarming was completed, all proximal and distal anastomoses were inspected for hemostasis.  Epicardial pacing wires were placed on the right ventricle and right atrium, and DOO pacing was initiated at 80 beats per minute.  When the patient had rewarmed to a core temperature of 37 degrees Celsius, he was weaned from cardiopulmonary bypass on the first attempt without inotropic support.  Total bypass time was 115 minutes.  Initial cardiac index was greater than 2 liters/minute/meter. sq, and he remained hemodynamically stable throughout the postbypass period.  A test dose of protamine was administered and was well tolerated.  The atrial and aortic cannulae were removed.  The remainder of the protamine was administered.  There was some transient hypotension which responded to  volume administration.  The chest was copiously irrigated with the warm saline.  Hemostasis was achieved.  The pericardium could not be reapproximated.  The left pleural and mediastinal chest tubes were placed via separate subcostal incisions.  The sternum was closed with a combination of single double heavy gauge stainless steel wires.  The pectoralis fascia, subcutaneous tissue, and skin were closed in standard fashion as well as the leg incision.  All sponge, needle, and instrument counts were correct at the end of the procedure. The patient was taken from the operating room to the Surgical  Intensive Care Unit, intubated and in good condition.     Revonda Standard Roxan Hockey, M.D.     SCH/MEDQ  D:  05/06/2013  T:  05/07/2013  Job:  [id number]

## 2013-05-07 NOTE — Progress Notes (Signed)
TCTS BRIEF SICU PROGRESS NOTE  1 Day Post-Op  S/P Procedure(s) (LRB): CORONARY ARTERY BYPASS GRAFTING (CABG) (N/A)   Stable day Somewhat fidgety but appropriate NSR w/ stable BP O2 sats 92-95% on 2 L/min via Speed Diuresing some  Plan: Continue current plan.  Watch for signs of EtOH withdrawal  OWEN,CLARENCE H 05/07/2013 7:17 PM

## 2013-05-07 NOTE — Procedures (Signed)
Extubation Procedure Note  Patient Details:   Name: Douglas Christensen DOB: 1951/01/28 MRN: [id number]   Airway Documentation:   pt extubated to 4lpm [facility], cuff leak was present, incentive spirometer 550'ccs good strong productive cough.  Evaluation  O2 sats: stable throughout Complications: No apparent complications Patient did tolerate procedure well. Bilateral Breath Sounds: Clear Suctioning: Airway Yes [id number]  Donella Stade 05/07/2013, 3:33 AM

## 2013-05-07 NOTE — Progress Notes (Signed)
Sleeve totally kinked off and leaking at site, sleeve removed

## 2013-05-07 NOTE — Progress Notes (Signed)
TCTS DAILY ICU PROGRESS NOTE                   [facility]            [place] [address]          [phone]   1 Day Post-Op Procedure(s) (LRB): CORONARY ARTERY BYPASS GRAFTING (CABG) (N/A)  Total Length of Stay:  LOS: 1 day   Subjective: Feels hungry, no pain, no SOB  Objective: Vital signs in last 24 hours: Temp:  [96.1 F (35.6 C)-99.7 F (37.6 C)] 99.1 F (37.3 C) (06/10 0600) Pulse Rate:  [78-83] 81 (06/10 0600) Cardiac Rhythm:  [-] Atrial paced (06/10 0500) Resp:  [11-18] 12 (06/10 0600) BP: (96-124)/(51-79) 111/59 mmHg (06/10 0600) SpO2:  [94 %-100 %] 96 % (06/10 0600) Arterial Line BP: (61-147)/(37-77) 104/54 mmHg (06/10 0600) FiO2 (%):  [40 %-60 %] 40 % (06/10 0159) Weight:  [186 lb 6.4 oz (84.55 kg)] 186 lb 6.4 oz (84.55 kg) (06/10 0500)  Filed Weights   05/05/13 1300 05/07/13 0500  Weight: 176 lb 12.9 oz (80.2 kg) 186 lb 6.4 oz (84.55 kg)    Weight change: 9 lb 9.5 oz (4.351 kg)   Hemodynamic parameters for last 24 hours: PAP: (23-59)/(14-39) 34/23 mmHg CO:  [3.2 L/min-4 L/min] 3.4 L/min CI:  [1.9 L/min/m2-2.4 L/min/m2] 2 L/min/m2  Intake/Output from previous day: 06/09 0701 - 06/10 0700 In: 5438.7 [I.V.:4008.7; Blood:350; NG/GT:30; IV Piggyback:1050] Out: [id number] [Urine:3800; Chest Tube:570] Weight change: 9 lb 9.5 oz (4.351 kg) Intake/Output this shift:    Current Meds: Scheduled Meds: . acetaminophen  1,000 mg Oral Q6H   Or  . acetaminophen (TYLENOL) oral liquid 160 mg/5 mL  975 mg Per Tube Q6H  . aspirin EC  325 mg Oral Daily   Or  . aspirin  324 mg Per Tube Daily  . atorvastatin  20 mg Oral Daily  . bisacodyl  10 mg Oral Daily   Or  . bisacodyl  10 mg Rectal Daily  . cefUROXime (ZINACEF)  IV  1.5 g Intravenous Q12H  . docusate sodium  200 mg Oral Daily  . insulin aspart  0-24 Units Subcutaneous Q4H  . insulin regular  0-10 Units Intravenous TID WC  . ipratropium  0.5 mg Nebulization Q6H  . levalbuterol  0.63 mg  Nebulization Q6H  . metoprolol tartrate  12.5 mg Oral BID   Or  . metoprolol tartrate  12.5 mg Per Tube BID  . [START ON 05/08/2013] pantoprazole  40 mg Oral Daily  . sodium chloride  3 mL Intravenous Q12H   Continuous Infusions: . sodium chloride 20 mL/hr at 05/06/13 2200  . sodium chloride 20 mL/hr at 05/06/13 1445  . sodium chloride    . dexmedetomidine Stopped (05/07/13 0244)  . insulin (NOVOLIN-R) infusion Stopped (05/06/13 1800)  . lactated ringers 20 mL/hr at 05/06/13 1415  . nitroGLYCERIN Stopped (05/06/13 1448)  . phenylephrine (NEO-SYNEPHRINE) Adult infusion 10 mcg/min (05/07/13 0500)   PRN Meds:.albumin human, budesonide-formoterol, levalbuterol, metoprolol, midazolam, morphine injection, ondansetron (ZOFRAN) IV, oxyCODONE, sodium chloride  General appearance: alert, cooperative and no distress Heart: regular rate and rhythm and minor rub with tubes in Lungs: clear anteriorly Abdomen: soft, nontender Extremities: minor edema Wound: dressings CDI  Lab Results: CBC: Recent Labs  05/06/13 2015 05/07/13 0450  WBC 11.4* 9.9  HGB 10.5* 10.6*  HCT 29.7* 30.0*  PLT 144* 142*   BMET:  Recent Labs  05/06/13 2004 05/06/13 2015 05/07/13 0450  NA 138  --  140  K 4.6  --  3.9  CL 107  --  107  CO2  --   --  22  GLUCOSE 111*  --  166*  BUN 21  --  22  CREATININE 1.20 1.24 1.35  CALCIUM  --   --  7.7*    ABG    Component Value Date/Time   PHART 7.372 05/07/2013 0430   PCO2ART 38.9 05/07/2013 0430   PO2ART 67.0* 05/07/2013 0430   HCO3 22.5 05/07/2013 0430   TCO2 24 05/07/2013 0430   ACIDBASEDEF 2.0 05/07/2013 0430   O2SAT 92.0 05/07/2013 0430   Results for Douglas Christensen, Douglas Christensen (MRN [id number]) as of 05/07/2013 07:23  Ref. Range 05/06/2013 21:30 05/07/2013 01:10 05/07/2013 03:23 05/07/2013 04:05 05/07/2013 04:30 05/07/2013 04:50 05/07/2013 06:12 05/07/2013 07:17  Glucose-Capillary Latest Range: 70-99 mg/dL 134 (H) 141 (H)  160 (H)         PT/INR:  Recent Labs  05/06/13 1432    LABPROT 16.3*  INR 1.34   Radiology: Dg Chest Portable 1 View  05/06/2013   *RADIOLOGY REPORT*  Clinical Data: Post CABG  PORTABLE CHEST - 1 VIEW  Comparison: Portable exam 1512 hours compared to 05/03/2013  Findings: Tip of endotracheal tube projects 3.3 cm above carina. Nasogastric tube extends into stomach. Right jugular Swan-Ganz catheter tip projects over right pulmonary artery. Mediastinal drain and left thoracostomy tube present. Enlargement of cardiac silhouette post CABG. Calcified tortuous aorta. Pulmonary vascular congestion. Perihilar infiltrates bilaterally likely mild edema. Minimal bibasilar atelectasis. No gross pleural effusion or pneumothorax.  IMPRESSION: Postoperative changes as above.   Original Report Authenticated By: Lavonia Dana, M.D.     Assessment/Plan: S/P Procedure(s) (LRB): CORONARY ARTERY BYPASS GRAFTING (CABG) (N/A)   1 doing well, wean off neo as able, sinus with 1 degree AVB under the pacer. Can prob turn off if BP tolerates 2 expected ABL anemia- mild stable, mod ctube drainage, prob d/c later today 3 sugars fairly well controlled, not a diabetic, follow 4 routine pulm toilet, former heavy smoker 5 creat sl.  Up-  follow, gentle diuresis    GOLD,WAYNE E 05/07/2013 7:22 AM

## 2013-05-08 ENCOUNTER — Inpatient Hospital Stay (HOSPITAL_COMMUNITY): Payer: 59

## 2013-05-08 LAB — GLUCOSE, CAPILLARY
Glucose-Capillary: 112 mg/dL — ABNORMAL HIGH (ref 70–99)
Glucose-Capillary: 116 mg/dL — ABNORMAL HIGH (ref 70–99)
Glucose-Capillary: 130 mg/dL — ABNORMAL HIGH (ref 70–99)
Glucose-Capillary: 141 mg/dL — ABNORMAL HIGH (ref 70–99)
Glucose-Capillary: 145 mg/dL — ABNORMAL HIGH (ref 70–99)

## 2013-05-08 LAB — BASIC METABOLIC PANEL
BUN: 30 mg/dL — ABNORMAL HIGH (ref 6–23)
GFR calc Af Amer: 69 mL/min — ABNORMAL LOW (ref >90)
GFR calc non Af Amer: 59 mL/min — ABNORMAL LOW (ref >90)
Potassium: 3.9 mEq/L (ref 3.5–5.1)
Sodium: 136 mEq/L (ref 135–145)

## 2013-05-08 LAB — CBC
Hemoglobin: 9.2 g/dL — ABNORMAL LOW (ref 13.0–17.0)
MCHC: 34.7 g/dL (ref 30.0–36.0)

## 2013-05-08 MED ORDER — BUDESONIDE-FORMOTEROL FUMARATE 160-4.5 MCG/ACT IN AERO
2.0000 | INHALATION_SPRAY | Freq: Two times a day (BID) | RESPIRATORY_TRACT | Status: DC
  Administered 2013-05-08 – 2013-05-12 (×8): 2 via RESPIRATORY_TRACT
  Filled 2013-05-08: qty 6

## 2013-05-08 MED ORDER — HYDROCHLOROTHIAZIDE 25 MG PO TABS
25.0000 mg | ORAL_TABLET | Freq: Every day | ORAL | Status: DC
  Administered 2013-05-08 – 2013-05-12 (×5): 25 mg via ORAL
  Filled 2013-05-08 (×5): qty 1

## 2013-05-08 MED ORDER — LOSARTAN POTASSIUM 50 MG PO TABS
100.0000 mg | ORAL_TABLET | Freq: Every day | ORAL | Status: DC
  Administered 2013-05-08 – 2013-05-09 (×2): 100 mg via ORAL
  Administered 2013-05-10: 50 mg via ORAL
  Administered 2013-05-11 – 2013-05-12 (×2): 100 mg via ORAL
  Filled 2013-05-08 (×5): qty 2

## 2013-05-08 MED ORDER — GUAIFENESIN-DM 100-10 MG/5ML PO SYRP
15.0000 mL | ORAL_SOLUTION | ORAL | Status: DC | PRN
  Administered 2013-05-10: 15 mL via ORAL
  Filled 2013-05-08: qty 15

## 2013-05-08 MED ORDER — ALUM & MAG HYDROXIDE-SIMETH 200-200-20 MG/5ML PO SUSP: 15.0000 mL | ORAL | Status: DC | PRN

## 2013-05-08 MED ORDER — LEVALBUTEROL HCL 0.63 MG/3ML IN NEBU: 0.6300 mg | INHALATION_SOLUTION | Freq: Four times a day (QID) | RESPIRATORY_TRACT | Status: DC | PRN

## 2013-05-08 MED ORDER — FUROSEMIDE 40 MG PO TABS
40.0000 mg | ORAL_TABLET | Freq: Every day | ORAL | Status: DC
  Administered 2013-05-08 – 2013-05-12 (×5): 40 mg via ORAL
  Filled 2013-05-08 (×5): qty 1

## 2013-05-08 MED ORDER — POTASSIUM CHLORIDE CRYS ER 20 MEQ PO TBCR
40.0000 meq | EXTENDED_RELEASE_TABLET | Freq: Two times a day (BID) | ORAL | Status: AC
  Administered 2013-05-08 (×2): 40 meq via ORAL
  Filled 2013-05-08: qty 2
  Filled 2013-05-08: qty 1

## 2013-05-08 MED ORDER — LEVALBUTEROL HCL 0.63 MG/3ML IN NEBU: 0.6300 mg | INHALATION_SOLUTION | Freq: Four times a day (QID) | RESPIRATORY_TRACT | Status: DC

## 2013-05-08 MED ORDER — OXYCODONE HCL 5 MG PO TABS
5.0000 mg | ORAL_TABLET | ORAL | Status: DC | PRN
  Administered 2013-05-08: 10 mg via ORAL
  Administered 2013-05-09 (×2): 5 mg via ORAL
  Administered 2013-05-10: 10 mg via ORAL
  Filled 2013-05-08: qty 1
  Filled 2013-05-08: qty 2
  Filled 2013-05-08: qty 1
  Filled 2013-05-08: qty 2
  Filled 2013-05-08: qty 1

## 2013-05-08 MED ORDER — SODIUM CHLORIDE 0.9 % IJ SOLN
3.0000 mL | Freq: Two times a day (BID) | INTRAMUSCULAR | Status: DC
  Administered 2013-05-08 – 2013-05-11 (×6): 3 mL via INTRAVENOUS

## 2013-05-08 MED ORDER — ATENOLOL 25 MG PO TABS
25.0000 mg | ORAL_TABLET | Freq: Every day | ORAL | Status: DC
  Administered 2013-05-08 – 2013-05-09 (×2): 25 mg via ORAL
  Filled 2013-05-08 (×3): qty 1

## 2013-05-08 MED ORDER — MOVING RIGHT ALONG BOOK
Freq: Once | Status: AC
  Administered 2013-05-08: 11:00:00
  Filled 2013-05-08: qty 1

## 2013-05-08 MED ORDER — IPRATROPIUM BROMIDE 0.02 % IN SOLN: 0.5000 mg | Freq: Four times a day (QID) | RESPIRATORY_TRACT | Status: DC

## 2013-05-08 MED ORDER — IPRATROPIUM BROMIDE 0.02 % IN SOLN: 0.5000 mg | Freq: Four times a day (QID) | RESPIRATORY_TRACT | Status: DC | PRN

## 2013-05-08 MED ORDER — POTASSIUM CHLORIDE CRYS ER 20 MEQ PO TBCR
20.0000 meq | EXTENDED_RELEASE_TABLET | Freq: Two times a day (BID) | ORAL | Status: DC
  Administered 2013-05-09 – 2013-05-12 (×6): 20 meq via ORAL
  Filled 2013-05-08 (×9): qty 1

## 2013-05-08 MED ORDER — MAGNESIUM HYDROXIDE 400 MG/5ML PO SUSP: 30.0000 mL | Freq: Every day | ORAL | Status: DC | PRN

## 2013-05-08 MED ORDER — SODIUM CHLORIDE 0.9 % IJ SOLN: 3.0000 mL | INTRAMUSCULAR | Status: DC | PRN

## 2013-05-08 MED ORDER — INSULIN ASPART 100 UNIT/ML ~~LOC~~ SOLN
0.0000 [IU] | Freq: Three times a day (TID) | SUBCUTANEOUS | Status: DC
  Administered 2013-05-08: 2 [IU] via SUBCUTANEOUS

## 2013-05-08 MED ORDER — LOSARTAN POTASSIUM-HCTZ 100-25 MG PO TABS: 1.0000 | ORAL_TABLET | Freq: Every day | ORAL | Status: DC

## 2013-05-08 MED ORDER — SODIUM CHLORIDE 0.9 % IV SOLN: 250.0000 mL | INTRAVENOUS | Status: DC | PRN

## 2013-05-08 NOTE — Progress Notes (Signed)
Pt t/x to 2023 in wheelchair on monitor without event. Pt's son in law in company during t/x. Hope RN given bedside report and was present for telemetry hookup. Pt's cellphone in his persons during t/x as well as glasses  Kathyrn Drown, Allene Dillon

## 2013-05-08 NOTE — Progress Notes (Signed)
2 Days Post-Op Procedure(s) (LRB): CORONARY ARTERY BYPASS GRAFTING (CABG) (N/A) Subjective: Feels well this AM Denies pain Has already been fro a walk Says nervousness is improved  Objective: Vital signs in last 24 hours: Temp:  [97.4 F (36.3 C)-98.4 F (36.9 C)] 97.6 F (36.4 C) (06/11 0749) Pulse Rate:  [64-89] 68 (06/11 0700) Cardiac Rhythm:  [-] Normal sinus rhythm (06/11 0400) Resp:  [9-21] 9 (06/11 0700) BP: (97-119)/(52-97) 102/84 mmHg (06/11 0700) SpO2:  [90 %-100 %] 100 % (06/11 0700) Arterial Line BP: (83-111)/(47-56) 105/52 mmHg (06/10 1115) Weight:  [187 lb 6.3 oz (85 kg)] 187 lb 6.3 oz (85 kg) (06/11 0500)  Hemodynamic parameters for last 24 hours: PAP: (34-45)/(21-31) 41/21 mmHg  Intake/Output from previous day: 06/10 0701 - 06/11 0700 In: 2318.7 [P.O.:1500; I.V.:318.7; IV Piggyback:500] Out: 2005 [Urine:2005] Intake/Output this shift:    General appearance: alert and no distress Neurologic: intact Heart: regular rate and rhythm Lungs: diminished breath sounds left base Abdomen: normal findings: soft, non-tender  Lab Results:  Recent Labs  05/07/13 1705 05/07/13 1707 05/08/13 0315  WBC 17.7*  --  18.6*  HGB 9.6* 9.9* 9.2*  HCT 27.0* 29.0* 26.5*  PLT 149*  --  144*   BMET:  Recent Labs  05/07/13 0450  05/07/13 1707 05/08/13 0315  NA 140  --  138 136  K 3.9  --  4.3 3.9  CL 107  --  101 99  CO2 22  --   --  24  GLUCOSE 166*  --  145* 101*  BUN 22  --  26* 30*  CREATININE 1.35  < > 1.30 1.26  CALCIUM 7.7*  --   --  8.1*  < > = values in this interval not displayed.  PT/INR:  Recent Labs  05/06/13 1432  LABPROT 16.3*  INR 1.34   ABG    Component Value Date/Time   PHART 7.372 05/07/2013 0430   HCO3 22.5 05/07/2013 0430   TCO2 24 05/07/2013 1707   ACIDBASEDEF 2.0 05/07/2013 0430   O2SAT 92.0 05/07/2013 0430   CBG (last 3)   Recent Labs  05/08/13 0004 05/08/13 0407 05/08/13 0751  GLUCAP 141* 130* 116*     Assessment/Plan: S/P Procedure(s) (LRB): CORONARY ARTERY BYPASS GRAFTING (CABG) (N/A) Plan for transfer to step-down: see transfer orders CV- s/p CABG x 5, in SR, HR/BP OK  ASA, betablocker, statin  Resume losartan tomorrow  RESP- pulmonary status improved  Hx tobacco abuse, mild COPD- FEV1 80% of normal  Nebs PRN, IS  RENAL- lytes, creatinine OK  Volume overloaded- diurese  Dc foley  ENDO- CBG improved- change to AC/ HS  Anemia secondary to ABL- mild, follow  Etoh use- was very anxious yesterday- improved with librium taper   LOS: 2 days    Charonda Hefter C 05/08/2013

## 2013-05-08 NOTE — Progress Notes (Signed)
Received pt from 2300. Pt is stable and resting in bed. Placed pt on 2L [place], sats were 85-88% on room air. Will re-assess the need for O2 again. Per report, pt has ambulated twice on 2300 this morning.

## 2013-05-08 NOTE — Progress Notes (Signed)
CARDIAC REHAB PHASE I   PRE:  Rate/Rhythm: 76SR  BP:  Supine:   Sitting: 98/62,    Standing:    SaO2: 91% 2L  MODE:  Ambulation:  bathroom ft POST:  Rate/Rhythm:   BP:  Supine:   Sitting: 92/54  Standing: 111/69   SaO2:  1430-1508 Came to walk with pt. He needed to go to bathroom. Took vital signs prior to getting pt up. He was concerned about low BP.  C/o needing to have BM. Pt wanted to sit and rest after trip to bathroom as he said he was dizzy. BP was 92/54 sitting and 111/69 standing. Pt said he felt nauseated and wanted to wait to walk. Stated will walk later when he feels better. Gave pt gingerale and turned temperature down in room.    Douglas Good, RN BSN  05/08/2013 3:02 PM

## 2013-05-08 NOTE — Progress Notes (Signed)
Pt's sats 95%on room air now. Will continue to monitor.

## 2013-05-09 ENCOUNTER — Inpatient Hospital Stay (HOSPITAL_COMMUNITY): Payer: 59

## 2013-05-09 LAB — CBC
HCT: 26 % — ABNORMAL LOW (ref 39.0–52.0)
MCHC: 34.6 g/dL (ref 30.0–36.0)
MCV: 96.3 fL (ref 78.0–100.0)
RDW: 13.8 % (ref 11.5–15.5)

## 2013-05-09 LAB — BASIC METABOLIC PANEL
BUN: 33 mg/dL — ABNORMAL HIGH (ref 6–23)
Creatinine, Ser: 1.15 mg/dL (ref 0.50–1.35)
GFR calc Af Amer: 77 mL/min — ABNORMAL LOW (ref >90)
GFR calc non Af Amer: 66 mL/min — ABNORMAL LOW (ref >90)
Glucose, Bld: 99 mg/dL (ref 70–99)

## 2013-05-09 NOTE — Progress Notes (Signed)
CARDIAC REHAB PHASE I   PRE:  Rate/Rhythm: 80 SR    BP: sitting right 76/40, left 96/50    SaO2: 94 RA  MODE:  Ambulation: 300 ft   POST:  Rate/Rhythm: 80 SR (after several min)    BP: sitting 120/50     SaO2: 87-93 RA (after several minutes of not registering)  Pt was nauseated earlier but feeling better now. Right arm BP very low, left slightly low. Denied dizziness. Some SOB walking although pt denies. C/o getting very tired at half way so returned to room. SaO2 would not register for several minutes after walk then 87-93 RA. Probably low while walking. Return to recliner. Will f/u. Encouraged IS (doing 750 cc max) and x2 more walks. [id number]  Josephina Shih [place] CES, ACSM 05/09/2013 10:46 AM

## 2013-05-09 NOTE — Progress Notes (Addendum)
      [address]       [place], [address]             [phone]    3 Days Post-Op Procedure(s) (LRB): CORONARY ARTERY BYPASS GRAFTING (CABG) (N/A)  Subjective:  Mr. Jellison states he is very nauseated this morning.  He denies vomiting.  He states he had a BM this morning.  Objective: Vital signs in last 24 hours: Temp:  [98.4 F (36.9 C)-98.8 F (37.1 C)] 98.7 F (37.1 C) (06/12 0423) Pulse Rate:  [77-92] 85 (06/12 0423) Cardiac Rhythm:  [-] Heart block (06/11 1945) Resp:  [12-20] 20 (06/12 0423) BP: (91-146)/(59-76) 146/65 mmHg (06/12 0423) SpO2:  [88 %-96 %] 92 % (06/12 0423) FiO2 (%):  [28 %] 28 % (06/11 1043) Weight:  [185 lb 6.5 oz (84.1 kg)] 185 lb 6.5 oz (84.1 kg) (06/12 0423)  Intake/Output from previous day: 06/11 0701 - 06/12 0700 In: 340 [P.O.:240; IV Piggyback:100] Out: 115 [Urine:115]  General appearance: alert, cooperative and no distress Heart: regular rate and rhythm Lungs: diminished breath sounds bibasilar Abdomen: soft, non-tender; bowel sounds normal; no masses,  no organomegaly Extremities: edema 1-2+ Wound: clean and dry  Lab Results:  Recent Labs  05/08/13 0315 05/09/13 0430  WBC 18.6* 14.5*  HGB 9.2* 9.0*  HCT 26.5* 26.0*  PLT 144* 166   BMET:  Recent Labs  05/08/13 0315 05/09/13 0430  NA 136 135  K 3.9 4.2  CL 99 100  CO2 24 27  GLUCOSE 101* 99  BUN 30* 33*  CREATININE 1.26 1.15  CALCIUM 8.1* 8.6    PT/INR:  Recent Labs  05/06/13 1432  LABPROT 16.3*  INR 1.34   ABG    Component Value Date/Time   PHART 7.372 05/07/2013 0430   HCO3 22.5 05/07/2013 0430   TCO2 24 05/07/2013 1707   ACIDBASEDEF 2.0 05/07/2013 0430   O2SAT 92.0 05/07/2013 0430   CBG (last 3)   Recent Labs  05/08/13 1628 05/08/13 2123 05/09/13 0629  GLUCAP 135* 88 102*    Assessment/Plan: S/P Procedure(s) (LRB): CORONARY ARTERY BYPASS GRAFTING (CABG) (N/A)  1. CV- NSR, rate controlled, mildly hypertensive this morning- will  continue atenolol and Cozaar 2. Pulm- no acute issues, continue IS 3. Renal- creatinine is at baseline, mildly volume overloaded, continue diuresis 4. Nausea- zofran prn 5. CBGs- controlled, patient not a diabetic will d/c glucose checks 6. Dispo- patient with nausea today, will monitor pressure and adjust medication if necessary   LOS: 3 days    Ahmed Prima, Junie Panning 05/09/2013

## 2013-05-09 NOTE — Care Management Note (Addendum)
    Page 1 of 1   05/10/2013     3:32:18 PM   CARE MANAGEMENT NOTE 05/10/2013  Patient:  Douglas Christensen, Douglas Christensen   Account Number:  [id number]  Date Initiated:  05/09/2013  Documentation initiated by:  Jerrick Farve  Subjective/Objective Assessment:   PT S/P CABG X 5 ON 05/06/13.  PTA, PT INDEPENDENT, LIVES WITH SPOUSE.     Action/Plan:   WIFE TO PROVIDE 24HR CARE AT DC.  DENIES ANY NEEDS FOR HOME.   Anticipated DC Date:  05/09/2013   Anticipated DC Plan:  [place]  CM consult      Choice offered to / List presented to:             Status of service:  In process, will continue to follow Medicare Important Message given?   (If response is "NO", the following Medicare IM given date fields will be blank) Date Medicare IM given:   Date Additional Medicare IM given:    Discharge Disposition:  HOME/SELF CARE  Per UR Regulation:  Reviewed for med. necessity/level of care/duration of stay  If discussed at [place] of Stay Meetings, dates discussed:    Comments:  05/10/13 Kaija Kovacevic,RN,BSN [id number] PT [place], POSSIBLY FROM REACTION TO PAIN MEDS.  MAY REQUIRE PT/OT CONSULTS IF CONFUSION CONTINUES. WILL FOLLOW PROGRESS.

## 2013-05-10 MED ORDER — ATENOLOL 50 MG PO TABS
50.0000 mg | ORAL_TABLET | Freq: Every day | ORAL | Status: DC
  Administered 2013-05-10 – 2013-05-12 (×3): 50 mg via ORAL
  Filled 2013-05-10 (×3): qty 1

## 2013-05-10 MED ORDER — FOLIC ACID 1 MG PO TABS
1.0000 mg | ORAL_TABLET | Freq: Every day | ORAL | Status: DC
  Administered 2013-05-11 – 2013-05-12 (×2): 1 mg via ORAL
  Filled 2013-05-10 (×3): qty 1

## 2013-05-10 MED ORDER — LORAZEPAM 2 MG/ML IJ SOLN: 1.0000 mg | Freq: Four times a day (QID) | INTRAMUSCULAR | Status: DC | PRN

## 2013-05-10 MED ORDER — OXYCODONE HCL 5 MG PO TABS: 5.0000 mg | ORAL_TABLET | ORAL | Status: DC | PRN

## 2013-05-10 MED ORDER — LORAZEPAM 1 MG PO TABS
1.0000 mg | ORAL_TABLET | Freq: Four times a day (QID) | ORAL | Status: DC | PRN
  Administered 2013-05-10: 1 mg via ORAL
  Filled 2013-05-10: qty 1

## 2013-05-10 MED ORDER — ADULT MULTIVITAMIN W/MINERALS CH
1.0000 | ORAL_TABLET | Freq: Every day | ORAL | Status: DC
  Filled 2013-05-10: qty 1

## 2013-05-10 MED ORDER — THIAMINE HCL 100 MG/ML IJ SOLN
100.0000 mg | Freq: Every day | INTRAMUSCULAR | Status: DC
  Filled 2013-05-10 (×2): qty 1

## 2013-05-10 MED ORDER — VITAMIN B-1 100 MG PO TABS
100.0000 mg | ORAL_TABLET | Freq: Every day | ORAL | Status: DC
  Administered 2013-05-12: 100 mg via ORAL
  Filled 2013-05-10 (×3): qty 1

## 2013-05-10 NOTE — Progress Notes (Signed)
Pt c/o CP 8/10.  EKG per protocol, no changes.  VS remain stable, NAD.  Pain medicine given, will continue to monitor.

## 2013-05-10 NOTE — Discharge Summary (Signed)
Physician Discharge Summary       [address]       ,[place] [address]             [phone]    Patient ID: NAIL SEETON MRN: [id number] DOB/AGE: 1951/01/07 62 y.o.  Admit date: 05/06/2013 Discharge date: 05/12/2013  Admission Diagnoses: 1. History of CAD 2.History of hyperlipidemia 3.History of hypertension 4.History of PVD 5.History of COPD 6.History of OSA 7.History of tobacco abuse  Discharge Diagnoses:  1. History of CAD 2.History of hyperlipidemia 3.History of hypertension 4.History of PVD 5.History of COPD 6.History of OSA 7.History of tobacco abuse   Procedure (s):  Median sternotomy, extracorporeal circulation, coronary  artery bypass grafting x5 (left internal mammary artery to LAD,  saphenous vein graft to first diagonal, saphenous vein graft to obtuse  marginal 1, sequential saphenous vein graft to posterior descending and  posterior lateral), endoscopic vein harvest, left leg by Dr. Roxan Hockey on 05/07/2013.  History of Presenting Illness: This a 62 year old gentleman with a history of mild coronary disease, heavy tobacco abuse, peripheral arterial disease, hypertension, and hyperlipidemia. He has been having problems with shortness of breath with exertion for several years now. For a long time, he would only have it with heavy exertion. However, over the last several months, he has been having it about 2-3 times a week at rest. He says he has woken up in the middle the night a couple of times feeling that he was going to die. He denies any chest, neck or arm pain associated with the shortness of breath. He's had extensive workup and was seen by Dr. Melvyn Novas. Pulmonary function testing showed FEV1 and FEC in the 75% range and diffusion capacity in the 80% range. A cardiac workup was then done. An exercise Myoview showed a moderate risk study with inferior ischemia. Cardiac catheterization was done on 05/02/2013 and revealed severe three-vessel coronary  disease not amenable to percutaneous intervention. The patient was seen in the office by Dr. Roxan Hockey for consideration of coronary artery bypass grafting surgery. Potential risks, complications, and benefits were discussed with the patient and he agreed to proceed. He underwent a CABG x 5 on 05/07/2013.   Brief Hospital Course:  The patient was extubated early the morning of surgery without difficulty. He remained afebrile and hemodynamically stable. Gordy Councilman, a line, chest tubes, and foley were removed early in the post operative course. Lopressor was started and titrated accordingly. He was volume over loaded and diuresed. He was closely monitored for alcohol withdrawal. He was placed on a Librium taper.He was weaned off the insulin drip. The patient's HGA1C pre op was  5.8. He will need further surveillance of HGA1C as an outpatient.The patient was felt surgically stable for transfer from the ICU to PCTU for further convalescence on 05/08/2013. He did have nausea on post op day three. This did resolve. He continues to progress with cardiac rehab. He was ambulating on room air. He has been tolerating a diet and has had a bowel movement. Epicardial pacing wires and chest tube sutures will be removed prior to discharge. Provided the patient remains afebrile, hemodynamically stable, and pending morning round evaluation, He will be surgically stable for discharge on 05/12/2013.    Latest Vital Signs: Blood pressure 140/77, pulse 101, temperature 98 F (36.7 C), temperature source Oral, resp. rate 20, height 5' 4.96" (1.65 m), weight 84.1 kg (185 lb 6.5 oz), SpO2 93.00%.  Physical Exam: General appearance: cooperative and no distress  Heart: regular rate and rhythm  Lungs: diminished breath sounds bilateral  Abdomen: soft, non-tender; bowel sounds normal; no masses, no organomegaly  Extremities: edema trace  Wound: clean and dry   Discharge Condition:Stable  Recent laboratory studies:    Lab Results  Component Value Date   WBC 14.5* 05/09/2013   HGB 9.0* 05/09/2013   HCT 26.0* 05/09/2013   MCV 96.3 05/09/2013   PLT 166 05/09/2013   Lab Results  Component Value Date   NA 135 05/09/2013   K 4.2 05/09/2013   CL 100 05/09/2013   CO2 27 05/09/2013   CREATININE 1.15 05/09/2013   GLUCOSE 99 05/09/2013      Diagnostic Studies: Dg Chest 2 View  05/09/2013   *RADIOLOGY REPORT*  Clinical Data: CABG, follow-up .  CHEST - 2 VIEW  Comparison: Portable chest x-ray of 07/08/2013  Findings: There is little change in aeration with bibasilar linear atelectasis and tiny effusions remaining.  Cardiomegaly is stable. No pneumothorax is seen  IMPRESSION: Persistent bibasilar linear atelectasis with small effusions.   Original Report Authenticated By: Ivar Drape, M.D.    Dg Chest Port 1 View  05/08/2013   *RADIOLOGY REPORT*  Clinical Data: Status post CABG.  PORTABLE CHEST - 1 VIEW  Comparison: Chest x-ray 05/07/2013.  Findings: Previously noted right internal jugular Cordis, Swan-Ganz catheter, left sided chest tube and mediastinal / pericardial drains have all been removed.  Epicardial pacing wires remain in position.  Lung volumes are low, and there are persistent bibasilar opacities most compatible with resolving areas of postoperative subsegmental atelectasis.  Probable trace bilateral pleural effusions.  No appreciable pneumothorax is identified.  No evidence of pulmonary edema.  Mild enlargement of the cardiopericardial silhouette is within normal limits for this postoperative patient. Upper mediastinal contours are slightly distorted by lordotic positioning and mild patient rotation to the left.  Atherosclerosis in the thoracic aorta.  Status post median sternotomy for CABG.  IMPRESSION: 1.  Postoperative changes and support apparatus, as above. 2.  Low lung volumes with resolving bibasilar postoperative subsegmental atelectasis and small bilateral pleural effusions. 3.  Atherosclerosis.   Original  Report Authenticated By: Vinnie Langton, M.D.      Future Appointments Provider Department Dept Phone   06/04/2013 11:30 AM Melrose Nakayama, MD [facility] [phone]   10/21/2013 1:00 PM [facility] [phone]   10/21/2013 1:30 PM [facility] [phone]   10/21/2013 2:45 PM Serafina Mitchell, MD [facility] [phone]      Discharge Medications:    Medication List    STOP taking these medications       aspirin 81 MG tablet      TAKE these medications       aspirin 325 MG EC tablet  Take 1 tablet (325 mg total) by mouth daily.     atenolol 50 MG tablet  Commonly known as:  TENORMIN  TAKE 1 TABLET BY MOUTH DAILY     atorvastatin 20 MG tablet  Commonly known as:  LIPITOR  Take 1 tablet by mouth daily.     budesonide-formoterol 160-4.5 MCG/ACT inhaler  Commonly known as:  SYMBICORT  Inhale 2 puffs into the lungs 2 (two) times daily as needed (for shortness of breath).     cholecalciferol 1000 UNITS tablet  Commonly known as:  VITAMIN D  Take 1,000 Units by mouth daily.     DSS 100  MG Caps  Take 200 mg by mouth 2 (two) times daily as needed for constipation.     furosemide 40 MG tablet  Commonly known as:  LASIX  Take 1 tablet (40 mg total) by mouth daily. For 7 Days     losartan-hydrochlorothiazide 100-25 MG per tablet  Commonly known as:  HYZAAR  Take 1 tablet by mouth daily.     potassium chloride SA 20 MEQ tablet  Commonly known as:  K-DUR,KLOR-CON  Take 1 tablet (20 mEq total) by mouth 2 (two) times daily. For 7 Days     ranitidine 150 MG tablet  Commonly known as:  ZANTAC  Take 150 mg by mouth daily as needed. For heartburn     sildenafil 100 MG tablet  Commonly known as:  VIAGRA  Take 100 mg by mouth daily as needed. For erectile dysfunction     STUDY MEDICATION  Take 1 tablet by  mouth daily. "[employer] Blind Study"     STUDY MEDICATION  Take 1 tablet by mouth 2 (two) times daily. "[employer] Blind Study"     vitamin B-12 1000 MCG tablet  Commonly known as:  CYANOCOBALAMIN  Take 1,000 mcg by mouth daily.         The patient has been discharged on:   1.Beta Blocker:  Yes [ x  ]                              No   [   ]                              If No, reason:  2.Ace Inhibitor/ARB: Yes [ x  ]                                     No  [    ]                                     If No, reason:  3.Statin:   Yes [ x  ]                  No  [   ]                  If No, reason:  4.Ecasa:  Yes  [ x  ]                  No   [   ]                  If No, reason:  Follow Up Appointments:     Follow-up Information   Follow up with Sherren Mocha, MD. (Call for a follow up for 2 weeks)    Contact information:   [address] [place] [address] [phone]       Follow up with Melrose Nakayama, MD. (PA/LAT CXR to be taken (at [place] which is in the same building as Dr. Leonarda Salon office ) on 06/04/2013 at 10:30 am;Appointment with Dr. Roxan Hockey is on 06/04/2013 at 11:30 am)    Contact information:   [address] [place] [address] [phone]  Follow up with [name], MD. (Call for follow up regarding further surveillance of pre op HGA1C 5.8)    Contact information:   [address] [place] [address] [phone]       Signed: Lars Pinks MPA-C 05/10/2013, 9:16 AM

## 2013-05-10 NOTE — Progress Notes (Addendum)
      [address]       [place] [address]             [phone]    4 Days Post-Op Procedure(s) (LRB): CORONARY ARTERY BYPASS GRAFTING (CABG) (N/A)  Subjective:  Douglas Christensen is sleeping on evaluation this morning.  He was arousable but did not stay awake very long.  This is most likely due to pain medication give at 0600.  He denies further nausea and chest pain +BM  Objective: Vital signs in last 24 hours: Temp:  [98 F (36.7 C)-98.9 F (37.2 C)] 98 F (36.7 C) (06/13 0300) Pulse Rate:  [74-101] 101 (06/13 0300) Cardiac Rhythm:  [-] Normal sinus rhythm (06/12 1945) Resp:  [18-20] 20 (06/13 0300) BP: (93-140)/(51-77) 140/77 mmHg (06/13 0559) SpO2:  [90 %-99 %] 94 % (06/13 0300)  Intake/Output from previous day: 06/12 0701 - 06/13 0700 In: 683 [P.O.:680; I.V.:3] Out: 1151 [Urine:1150; Stool:1]  General appearance: cooperative and no distress Heart: regular rate and rhythm Lungs: diminished breath sounds bilateral Abdomen: soft, non-tender; bowel sounds normal; no masses,  no organomegaly Extremities: edema trace Wound: clean and dry  Lab Results:  Recent Labs  05/08/13 0315 05/09/13 0430  WBC 18.6* 14.5*  HGB 9.2* 9.0*  HCT 26.5* 26.0*  PLT 144* 166   BMET:  Recent Labs  05/08/13 0315 05/09/13 0430  NA 136 135  K 3.9 4.2  CL 99 100  CO2 24 27  GLUCOSE 101* 99  BUN 30* 33*  CREATININE 1.26 1.15  CALCIUM 8.1* 8.6    PT/INR: No results found for this basename: LABPROT, INR,  in the last 72 hours ABG    Component Value Date/Time   PHART 7.372 05/07/2013 0430   HCO3 22.5 05/07/2013 0430   TCO2 24 05/07/2013 1707   ACIDBASEDEF 2.0 05/07/2013 0430   O2SAT 92.0 05/07/2013 0430   CBG (last 3)   Recent Labs  05/08/13 1628 05/08/13 2123 05/09/13 0629  GLUCAP 135* 88 102*    Assessment/Plan: S/P Procedure(s) (LRB): CORONARY ARTERY BYPASS GRAFTING (CABG) (N/A)  1. CV- NSR tachy, hypertensive- will increase Atenolol to 50 mg BID 2.  Pulm- remains on oxygen, will wean as tolerated, encouraged use of IS 3. Renal- volume overloaded- continue diuresis 4. Somnolence- will decrease Oxy to 5mg  and try Tramadol for additional pain management 5. Dispo- Will d/c EPW today, wean oxygen as tolerated, likely d/c home this weekend   LOS: 4 days    BARRETT, ERIN 05/10/2013  I have seen and examined the patient and agree with the assessment and plan as outlined.  Douglas Christensen is confused this morning, reportedly quite different than yesterday.  He is oriented x2 and denies any pain or SOB.  He is not agitated and reportedly he got some oxycodone earlier.  He remains on a low dose librium taper because of h/o alcohol abuse.  Will monitor closely and stop oxycodone.  Douglas Christensen H 05/10/2013 10:40 AM

## 2013-05-10 NOTE — Progress Notes (Signed)
CARDIAC REHAB PHASE I   PRE:  Rate/Rhythm: 88 SR  BP:  Supine:   Sitting: 110/60  Standing:    SaO2: 92 RA  MODE:  Ambulation: 150 ft   POST:  Rate/Rhythm: 93  BP:  Supine:   Sitting: 92/50  Standing:    SaO2: 93 RA 1425-1455 On arrival nurse with pt giving him medications. Pt drowsy and disoriented. Pt very stiff with getting out of bed. Assisted X 2 used walker and gait belt to ambulate. Pt moving very slowly and barely picking up feet. Questioned pt states that he just does not feel like himself. Only able to get pt 150 feet, he had difficulty steering walker. Ra sat after walk 93%. Pt to recliner after walk with call light in reach and chair alarm on.  Rodney Langton RN 05/10/2013 2:56 PM

## 2013-05-10 NOTE — Progress Notes (Addendum)
Removed epicardial wires (4 intact). Met resistance with left side wires and called Erin Barrett to remove.  Pt resting in bed for one hour.  Frequent vital signs taken and documented. Pt has been confused throughout the day and not able to stay in one bed.  Spoke with Junie Panning about medication and exhaustion possibly causing confusion.  Will continue to monitor. Payton Emerald

## 2013-05-11 LAB — GLUCOSE, CAPILLARY: Glucose-Capillary: 87 mg/dL (ref 70–99)

## 2013-05-11 NOTE — Progress Notes (Addendum)
      [address]       [place] [address]             [phone]    5 Days Post-Op Procedure(s) (LRB): CORONARY ARTERY BYPASS GRAFTING (CABG) (N/A)  Subjective:  [name] continues to not sleep at night.  He is also getting interupted throughout the day.  When he is awake is he lucid.    Objective: Vital signs in last 24 hours: Temp:  [98.1 F (36.7 C)-99.3 F (37.4 C)] 99.2 F (37.3 C) (06/14 0426) Pulse Rate:  [81-92] 92 (06/14 0426) Cardiac Rhythm:  [-] Normal sinus rhythm (06/13 2154) Resp:  [18-20] 19 (06/14 0426) BP: (95-137)/(50-79) 117/59 mmHg (06/14 0426) SpO2:  [93 %-100 %] 95 % (06/14 0426) Weight:  [171 lb 8.3 oz (77.8 kg)] 171 lb 8.3 oz (77.8 kg) (06/14 0426)  Intake/Output from previous day: 06/13 0701 - 06/14 0700 In: 3 [I.V.:3] Out: 1525 [Urine:1525]  General appearance: alert, cooperative and no distress Heart: regular rate and rhythm Lungs: clear to auscultation bilaterally Abdomen: soft, non-tender; bowel sounds normal; no masses,  no organomegaly Extremities: edema trace Wound: clean and dry  Lab Results:  Recent Labs  05/09/13 0430  WBC 14.5*  HGB 9.0*  HCT 26.0*  PLT 166   BMET:  Recent Labs  05/09/13 0430  NA 135  K 4.2  CL 100  CO2 27  GLUCOSE 99  BUN 33*  CREATININE 1.15  CALCIUM 8.6    PT/INR: No results found for this basename: LABPROT, INR,  in the last 72 hours ABG    Component Value Date/Time   PHART 7.372 05/07/2013 0430   HCO3 22.5 05/07/2013 0430   TCO2 24 05/07/2013 1707   ACIDBASEDEF 2.0 05/07/2013 0430   O2SAT 92.0 05/07/2013 0430   CBG (last 3)   Recent Labs  05/08/13 1628 05/08/13 2123 05/09/13 0629  GLUCAP 135* 88 102*    Assessment/Plan: S/P Procedure(s) (LRB): CORONARY ARTERY BYPASS GRAFTING (CABG) (N/A)  1. CV- NSR, HR, pressure improved- continue Atenolol 2. Pulm- no acute issues, continue IS 3. Confusion- appears worse with patient not sleeping and on first awakening, if  patient is awake is lucid 4. Dispo- patient with continued confusion at times.  Per wife at bedside patient was awake all night and finally fell asleep at 4 AM.  Not currently taking Narcotics, off Vibrium.  Will need to assess patient when fully awake.  He appears stable from surgery standpoint and may do better at home in natural environment   LOS: 5 days    Ahmed Prima, ERIN 05/11/2013  I have seen and examined the patient and agree with the assessment and plan as outlined.  Doing much better today.  Possibly ready for d/c home tomorrow or Monday  OWEN,CLARENCE H 05/11/2013 10:16 AM

## 2013-05-11 NOTE — Progress Notes (Addendum)
CARDIAC REHAB PHASE I   PRE:  Rate/Rhythm: 73 sinus rhythm first degree AV   BP:  Supine:   Sitting: 108/60  Standing:    SaO2: 97% ra  MODE:  Ambulation: 150 ft   POST:  Rate/Rhythem: 68 sinus  BP:  Supine:   Sitting: 102/56  Standing:    SaO2: 97%  1400-1430 Wobbly gait. Pt has difficulty using walker, gait strongly veers to left. Pt unable to correct with 2 person assist.  Pt states he does not have a walker at home. Pt lives alone.  Pt states he can stay with his girlfriend who works part time.  Pt is drowsy. Pt states he does not feel he is ready to take care of himself at home.  Pt abilities reviewed with RN.  unalbe to complete education as pt fell asleep immediately upon returning to chair.  Pt sister present with him in room. Call light in reach. Aleasha Fregeau, [place]

## 2013-05-12 LAB — BASIC METABOLIC PANEL
BUN: 30 mg/dL — ABNORMAL HIGH (ref 6–23)
Calcium: 9.3 mg/dL (ref 8.4–10.5)
Creatinine, Ser: 1.4 mg/dL — ABNORMAL HIGH (ref 0.50–1.35)
GFR calc Af Amer: 61 mL/min — ABNORMAL LOW (ref >90)
GFR calc non Af Amer: 52 mL/min — ABNORMAL LOW (ref >90)

## 2013-05-12 LAB — CBC
HCT: 29.8 % — ABNORMAL LOW (ref 39.0–52.0)
MCHC: 33.9 g/dL (ref 30.0–36.0)
Platelets: 284 10*3/uL (ref 150–400)
RDW: 14.2 % (ref 11.5–15.5)

## 2013-05-12 MED ORDER — DSS 100 MG PO CAPS: 200.0000 mg | ORAL_CAPSULE | Freq: Two times a day (BID) | ORAL | Status: DC | PRN

## 2013-05-12 MED ORDER — POTASSIUM CHLORIDE CRYS ER 20 MEQ PO TBCR: 20.0000 meq | EXTENDED_RELEASE_TABLET | Freq: Two times a day (BID) | ORAL | Status: DC

## 2013-05-12 MED ORDER — FUROSEMIDE 40 MG PO TABS: 40.0000 mg | ORAL_TABLET | Freq: Every day | ORAL | Status: DC

## 2013-05-12 MED ORDER — ASPIRIN 325 MG PO TBEC: 325.0000 mg | DELAYED_RELEASE_TABLET | Freq: Every day | ORAL | Status: DC

## 2013-05-12 NOTE — Progress Notes (Addendum)
      [address]       [place] [address]             [phone]     6 Days Post-Op Procedure(s) (LRB): CORONARY ARTERY BYPASS GRAFTING (CABG) (N/A)  Subjective:  Douglas Christensen is awake and alert this morning.  States he was able to sleep last night and feels ready to go home.  +BM  Objective: Vital signs in last 24 hours: Temp:  [98 F (36.7 C)-98.3 F (36.8 C)] 98.3 F (36.8 C) (06/15 0502) Pulse Rate:  [67-94] 70 (06/15 0502) Cardiac Rhythm:  [-] Heart block (06/14 2000) Resp:  [18-20] 20 (06/15 0502) BP: (83-104)/(56-66) 90/58 mmHg (06/15 0502) SpO2:  [92 %-96 %] 92 % (06/15 0502)  General appearance: alert, cooperative and no distress Neurologic: intact Heart: regular rate and rhythm Lungs: clear to auscultation bilaterally Abdomen: soft, non-tender; bowel sounds normal; no masses,  no organomegaly Extremities: edema trace Wound: clean and dry  Lab Results:  Recent Labs  05/12/13 0450  WBC 12.2*  HGB 10.1*  HCT 29.8*  PLT 284   BMET:  Recent Labs  05/12/13 0450  NA 141  K 3.5  CL 101  CO2 28  GLUCOSE 124*  BUN 30*  CREATININE 1.40*  CALCIUM 9.3    PT/INR: No results found for this basename: LABPROT, INR,  in the last 72 hours ABG    Component Value Date/Time   PHART 7.372 05/07/2013 0430   HCO3 22.5 05/07/2013 0430   TCO2 24 05/07/2013 1707   ACIDBASEDEF 2.0 05/07/2013 0430   O2SAT 92.0 05/07/2013 0430   CBG (last 3)   Recent Labs  05/11/13 0935  GLUCAP 87    Assessment/Plan: S/P Procedure(s) (LRB): CORONARY ARTERY BYPASS GRAFTING (CABG) (N/A)  1. CV- NSR good reate and pressure control- continue Atenolol 2. Pulm- no acute issues, encouraged continued use of IS 3. Confusion- improved, patient alert and oriented this morning 4. Dispo- patient looks great this morning, wishes to go home.  Sister and girlfriend to provide care will d/c home today    LOS: 6 days    Douglas Christensen, Douglas Christensen 05/12/2013  I have seen and examined  the patient and agree with the assessment and plan as outlined.  Douglas Christensen 05/12/2013 10:31 AM

## 2013-05-12 NOTE — Progress Notes (Signed)
Pt/family given discharge instructions, medication lists, follow up appointments, and when to call the doctor.  Pt/family verbalizes understanding. Pt given signs and symptoms of infection. Patient ambulating without assistance in room.  Alert and oriented x 4. RW given to pt at discharge. Pt given wheelchair ride to 6700 to visit his mother-in-law prior to leaving hospital.  Pt asked about getting narcotic pain medication at discharge.  I explained that we had not been given this to him the last few days due to confusion.  He will take acetamenophen if needed.  If needs anything other than this will call MD. Educated pt on exercise, lifting restrictions, incentive spirometry, bathing, and follow up appts. Pt/girlfriend watched post surgery video. Pt and girlfriend Kieth Brightly) verbalized understanding. Payton Emerald

## 2013-05-12 NOTE — Progress Notes (Signed)
   CARE MANAGEMENT NOTE 05/12/2013  Patient:  Douglas Christensen, Douglas Christensen   Account Number:  [id number]  Date Initiated:  05/09/2013  Documentation initiated by:  AMERSON,JULIE  Subjective/Objective Assessment:   PT S/P CABG X 5 ON 05/06/13.  PTA, PT INDEPENDENT, LIVES WITH SPOUSE.     Action/Plan:   WIFE TO PROVIDE 24HR CARE AT DC.  DENIES ANY NEEDS FOR HOME.   Anticipated DC Date:  05/09/2013   Anticipated DC Plan:  [place]  CM consult      [facility] Choice  HOME HEALTH   Choice offered to / List presented to:  C-1 Patient        [place] arranged  HH-1 RN  [place].   Status of service:  Completed, signed off Medicare Important Message given?   (If response is "NO", the following Medicare IM given date fields will be blank) Date Medicare IM given:   Date Additional Medicare IM given:    Discharge Disposition:  HOME/SELF CARE  Per UR Regulation:  Reviewed for med. necessity/level of care/duration of stay  If discussed at [place] of Stay Meetings, dates discussed:    Comments:  05/12/2013 1300 Offered choice and pt agreeable to [facility] for [facility]. Faxed referral to [facility] for [facility] for scheduled dc today. Contacted DME rep for RW. Jonnie Finner RN CCM Case Mgmt phone [phone]  05/10/13 JULIE AMERSON,RN,BSN [id number] PT QUITE CONFUSED TODAY, POSSIBLY FROM REACTION TO PAIN MEDS.  MAY REQUIRE PT/OT CONSULTS IF CONFUSION CONTINUES. WILL FOLLOW PROGRESS.

## 2013-05-15 ENCOUNTER — Telehealth: Payer: Self-pay | Admitting: *Deleted

## 2013-05-15 DIAGNOSIS — G8918 Other acute postprocedural pain: Secondary | ICD-10-CM

## 2013-05-15 MED ORDER — TRAMADOL HCL 50 MG PO TABS: 50.0000 mg | ORAL_TABLET | Freq: Four times a day (QID) | ORAL | Status: DC | PRN

## 2013-05-15 NOTE — Telephone Encounter (Signed)
Home health had faxed our office a note that they had been unable to find Douglas Christensen to begin start of care.  I called hom yesterday and asked him to call me so that I could help with this issue.  He called back this morning to let me know that they were coming this morning to see him.  He also said that the Tylenol that he was told to take for pain on discharge wasn't helping.  I told him I had reviewed his post op course and that due to his confusion from the apparent narcotics, that was why only Tylenol was prescribed.  He said that he has been able to take Tramadol in the past with no adverse side effects.  I told Tramadol would be prescribed.

## 2013-05-17 ENCOUNTER — Other Ambulatory Visit: Payer: Self-pay | Admitting: Physician Assistant

## 2013-05-20 ENCOUNTER — Other Ambulatory Visit: Payer: Self-pay | Admitting: Physician Assistant

## 2013-05-24 ENCOUNTER — Ambulatory Visit (INDEPENDENT_AMBULATORY_CARE_PROVIDER_SITE_OTHER): Payer: 59 | Admitting: Nurse Practitioner

## 2013-05-24 ENCOUNTER — Encounter: Payer: Self-pay | Admitting: Nurse Practitioner

## 2013-05-24 ENCOUNTER — Other Ambulatory Visit: Payer: Self-pay | Admitting: Physician Assistant

## 2013-05-24 VITALS — BP 110/62 | HR 67 | Ht 65.0 in | Wt 161.8 lb

## 2013-05-24 DIAGNOSIS — I70219 Atherosclerosis of native arteries of extremities with intermittent claudication, unspecified extremity: Secondary | ICD-10-CM

## 2013-05-24 DIAGNOSIS — Z951 Presence of aortocoronary bypass graft: Secondary | ICD-10-CM

## 2013-05-24 LAB — BASIC METABOLIC PANEL
BUN: 74 mg/dL — ABNORMAL HIGH (ref 6–23)
CO2: 19 mEq/L (ref 19–32)
Calcium: 9.9 mg/dL (ref 8.4–10.5)
Chloride: 98 mEq/L (ref 96–112)
Creatinine, Ser: 2.1 mg/dL — ABNORMAL HIGH (ref 0.4–1.5)
GFR: 33.97 mL/min — ABNORMAL LOW (ref >60.00)
Glucose, Bld: 102 mg/dL — ABNORMAL HIGH (ref 70–99)
Potassium: 4.2 mEq/L (ref 3.5–5.1)
Sodium: 134 mEq/L — ABNORMAL LOW (ref 135–145)

## 2013-05-24 LAB — CBC WITH DIFFERENTIAL/PLATELET
Basophils Absolute: 0.1 10*3/uL (ref 0.0–0.1)
Basophils Relative: 1 % (ref 0.0–3.0)
Eosinophils Absolute: 0.2 10*3/uL (ref 0.0–0.7)
Eosinophils Relative: 2.6 % (ref 0.0–5.0)
HCT: 33.8 % — ABNORMAL LOW (ref 39.0–52.0)
Hemoglobin: 11.3 g/dL — ABNORMAL LOW (ref 13.0–17.0)
Lymphocytes Relative: 23.5 % (ref 12.0–46.0)
Lymphs Abs: 1.9 10*3/uL (ref 0.7–4.0)
MCHC: 33.5 g/dL (ref 30.0–36.0)
MCV: 98 fl (ref 78.0–100.0)
Monocytes Absolute: 0.7 10*3/uL (ref 0.1–1.0)
Monocytes Relative: 8.8 % (ref 3.0–12.0)
Neutro Abs: 5.2 10*3/uL (ref 1.4–7.7)
Neutrophils Relative %: 64.1 % (ref 43.0–77.0)
Platelets: 583 10*3/uL — ABNORMAL HIGH (ref 150.0–400.0)
RBC: 3.45 Mil/uL — ABNORMAL LOW (ref 4.22–5.81)
RDW: 14 % (ref 11.5–14.6)
WBC: 8.2 10*3/uL (ref 4.5–10.5)

## 2013-05-24 NOTE — Patient Instructions (Addendum)
Stop taking the Phenergan for nausea - this is why you are too sleepy  Stop the Lasix and potassium  I will see you in a month  We need to check labs today  Try to not drink alcohol  You should get a phone call from cardiac rehab (to exercise at the hospital)  Call the [facility] office at [phone] if you have any questions, problems or concerns.

## 2013-05-24 NOTE — Progress Notes (Signed)
Douglas Christensen Date of Birth: Aug 19, 1951 Medical Record [id number]  History of Present Illness: Douglas Christensen is seen back today for a post hospital visit. Seen for Dr. Burt Knack. He has known CAD, HLD, HTN, PVD with prior right SFA stenting and left common iliac stenting in 2008/left external iliac stenting in 2012, COPD, OSA and tobacco abuse.   Was seen back in May and due to complaints of dyspnea (with surprisingly normal PFTs) referred for Myoview - this was abnormal with inferior ischemia - cath led to CABG x 5 per Dr. Roxan Hockey.    He comes in today. He is here alone. Doing well for the most part. Looks like he was given the protocol to prevent alcohol withdrawal. Back drinking since he has been home. Hard to quantify. Says his breathing is ok. Not dizzy. Mostly with poor appetite and feeling nauseated. Not vomiting. No belly pain. Bowels are ok - no diarrhea or constipation.  Wounds are ok. No fever or chills. He is taking what sounds like Phenergan for the nausea. He says he is just staying sleepy and can't stay awake.   Current Outpatient Prescriptions  Medication Sig Dispense Refill  . aspirin EC 325 MG EC tablet Take 1 tablet (325 mg total) by mouth daily.  30 tablet  0  . atenolol (TENORMIN) 50 MG tablet TAKE 1 TABLET BY MOUTH DAILY  90 tablet  0  . atorvastatin (LIPITOR) 20 MG tablet Take 1 tablet by mouth daily.      . budesonide-formoterol (SYMBICORT) 160-4.5 MCG/ACT inhaler Inhale 2 puffs into the lungs 2 (two) times daily as needed (for shortness of breath).      . cholecalciferol (VITAMIN D) 1000 UNITS tablet Take 1,000 Units by mouth daily.        Marland Kitchen docusate sodium 100 MG CAPS Take 200 mg by mouth 2 (two) times daily as needed for constipation.  10 capsule  0  . furosemide (LASIX) 40 MG tablet Take 1 tablet (40 mg total) by mouth daily. For 7 Days  7 tablet  0  . losartan-hydrochlorothiazide (HYZAAR) 100-25 MG per tablet Take 1 tablet by mouth daily.      . potassium chloride SA  (K-DUR,KLOR-CON) 20 MEQ tablet Take 1 tablet (20 mEq total) by mouth 2 (two) times daily. For 7 Days  7 tablet  0  . ranitidine (ZANTAC) 150 MG tablet Take 150 mg by mouth daily as needed. For heartburn       . sildenafil (VIAGRA) 100 MG tablet Take 100 mg by mouth daily as needed. For erectile dysfunction      . STUDY MEDICATION Take 1 tablet by mouth daily. "[employer]      . STUDY MEDICATION Take 1 tablet by mouth 2 (two) times daily. "[employer]      . traMADol (ULTRAM) 50 MG tablet Take 1-2 tablets (50-100 mg total) by mouth every 6 (six) hours as needed for pain.  40 tablet  0  . vitamin B-12 (CYANOCOBALAMIN) 1000 MCG tablet Take 1,000 mcg by mouth daily.       No current facility-administered medications for this visit.    Allergies  Allergen Reactions  . Benazepril     cough    Past Medical History  Diagnosis Date  . History of colonic polyps   . COPD (chronic obstructive pulmonary disease)   . CAD (coronary artery disease)     Mild plaque (cath "years ago"); abnormal Myoview 04/2013 with subsequent CABG  x 5 with LIMA to LAD, SVG to OM1, SVG to DX, SVG to PD & PL.   Marland Kitchen GERD (gastroesophageal reflux disease)   . Hyperlipidemia   . Hypertension   . Vitamin D deficiency   . Meralgia paresthetica of left side 2011  . LBP (low back pain)   . PVD (peripheral vascular disease)     Stent to left common femoral and right superficial femoral.  2008.  50%  left renal   . Sleep apnea     mod OSA, central sleep apnea/hypoapnea syndrome 11/22/12  . Shortness of breath     Past Surgical History  Procedure Laterality Date  . None    . Lower extremity stents      bilateral lower extremities x 2  . Femoral-popliteal bypass graft  12/27/2012    Procedure: BYPASS GRAFT FEMORAL-POPLITEAL ARTERY;  Surgeon: Serafina Mitchell, MD;  Location: [facility] OR;  Service: Vascular;  Laterality: Right;  using non-reversed sapphenous vein.  . Cardiac catheterization  05/02/13      x2   . Coronary artery bypass graft N/A 05/06/2013    Procedure: CORONARY ARTERY BYPASS GRAFTING (CABG);  Surgeon: Melrose Nakayama, MD;  Location: [place];  Service: Open Heart Surgery;  Laterality: N/A;  Coronary artery bypass graft times five using left internal mammary artery and left greater saphenous vein via endovein harvest.    History  Smoking status  . Former Smoker -- 2.00 packs/day for 47 years  . Types: Cigarettes  . Quit date: 12/27/2012  Smokeless tobacco  . Never Used    History  Alcohol Use  . 2.0 oz/week  . 4 drink(s) per week    Family History  Problem Relation Age of Onset  . Heart disease Mother     No clear CAD  . Hypertension Mother   . Cancer Mother     ? colon ca  . Hyperlipidemia Mother   . Cancer Father     brain tumor  . Alzheimer's disease Father   . Colon cancer Neg Hx     Review of Systems: The review of systems is per the HPI.  All other systems were reviewed and are negative.  Physical Exam: BP 110/62  Pulse 67  Ht 5\' 5"  (1.651 m)  Wt 161 lb 12.8 oz (73.392 kg)  BMI 26.92 kg/m2 Patient is pleasant and in no acute distress. Skin is warm and dry. Color is normal.  HEENT is unremarkable. Normocephalic/atraumatic. PERRL. Sclera are nonicteric. Neck is supple. No masses. No JVD. Lungs are clear. Cardiac exam shows a regular rate and rhythm. Sternum looks ok. Abdomen is soft. Extremities are without any edema. Gait and ROM are intact. No gross neurologic deficits noted.  LABORATORY DATA: EKG today shows sinus rhythm. Nonspecific ST/T wave changes.   Lab Results  Component Value Date   WBC 12.2* 05/12/2013   HGB 10.1* 05/12/2013   HCT 29.8* 05/12/2013   PLT 284 05/12/2013   GLUCOSE 124* 05/12/2013   CHOL 236* 08/28/2012   TRIG 291.0* 08/28/2012   HDL 36.50* 08/28/2012   LDLDIRECT 159.6 08/28/2012   LDLCALC 111* 09/25/2006   ALT 35 05/03/2013   AST 33 05/03/2013   NA 141 05/12/2013   K 3.5 05/12/2013   CL 101 05/12/2013   CREATININE 1.40*  05/12/2013   BUN 30* 05/12/2013   CO2 28 05/12/2013   TSH 1.93 08/28/2012   PSA 0.65 08/28/2012   INR 1.34 05/06/2013   HGBA1C 5.8* 05/03/2013  Assessment / Plan: 1. CAD - s/p CABG x 5 with LIMA to LAD, SVG to 1st DX, SVG to OM 1, SVT to PD & PL. - needs baseline labs today. I think overall he is doing ok. He is back drinking unfortunately, this may be why he is having GI issues - I told him the Phenergan would make him sleepy and I would not recommend taking. I have stopped the potassium and his Lasix today. I will see him back in a month. I have sent a message to cardiac rehab - maybe he will go.   2. HTN - blood pressure looks ok.   3. HLD - needs lipids checked on return visit - will check on return when he is fasting.    4. PVD   5. Tobacco abuse - not smoking  6. Alcohol abuse - he admits that he is back drinking - not able to really quantify.   Patient is agreeable to this plan and will call if any problems develop in the interim.   [name], RN, ANP-C [place] [address] [place], [place]  [address]

## 2013-05-27 ENCOUNTER — Other Ambulatory Visit: Payer: Self-pay | Admitting: *Deleted

## 2013-05-27 ENCOUNTER — Encounter: Payer: Self-pay | Admitting: *Deleted

## 2013-05-27 DIAGNOSIS — N189 Chronic kidney disease, unspecified: Secondary | ICD-10-CM

## 2013-05-28 ENCOUNTER — Other Ambulatory Visit: Payer: 59

## 2013-05-29 ENCOUNTER — Telehealth: Payer: Self-pay | Admitting: *Deleted

## 2013-05-29 ENCOUNTER — Other Ambulatory Visit: Payer: Self-pay | Admitting: *Deleted

## 2013-05-29 ENCOUNTER — Other Ambulatory Visit (INDEPENDENT_AMBULATORY_CARE_PROVIDER_SITE_OTHER): Payer: 59

## 2013-05-29 DIAGNOSIS — I2510 Atherosclerotic heart disease of native coronary artery without angina pectoris: Secondary | ICD-10-CM

## 2013-05-29 DIAGNOSIS — N189 Chronic kidney disease, unspecified: Secondary | ICD-10-CM

## 2013-05-29 LAB — BASIC METABOLIC PANEL
BUN: 57 mg/dL — ABNORMAL HIGH (ref 6–23)
CO2: 22 mEq/L (ref 19–32)
Calcium: 9.3 mg/dL (ref 8.4–10.5)
Chloride: 99 mEq/L (ref 96–112)
Creatinine, Ser: 3 mg/dL — ABNORMAL HIGH (ref 0.4–1.5)
GFR: 22.29 mL/min — ABNORMAL LOW (ref >60.00)
Glucose, Bld: 121 mg/dL — ABNORMAL HIGH (ref 70–99)
Potassium: 3.1 mEq/L — ABNORMAL LOW (ref 3.5–5.1)
Sodium: 133 mEq/L — ABNORMAL LOW (ref 135–145)

## 2013-05-29 MED ORDER — LOSARTAN POTASSIUM-HCTZ 100-25 MG PO TABS: 0.5000 | ORAL_TABLET | Freq: Every day | ORAL | Status: DC

## 2013-05-29 MED ORDER — ATENOLOL 50 MG PO TABS: 25.0000 mg | ORAL_TABLET | Freq: Every day | ORAL | Status: DC

## 2013-05-29 NOTE — Telephone Encounter (Signed)
Patient comes in today for labs work.  Tells the lab tech that his bp was low yesterday.  I checked bp today whic is 82/50, hr 68.  Pt states that his bp yesterday was 70/50 and he was dizzy and nauseated.  Per Truitt Merle, have pt cut his dose of atenolol and hyzaar in half and come back next week for a bp check.  Pt agreed to plan.

## 2013-05-30 ENCOUNTER — Encounter: Payer: Self-pay | Admitting: *Deleted

## 2013-06-03 ENCOUNTER — Other Ambulatory Visit: Payer: Self-pay

## 2013-06-03 ENCOUNTER — Telehealth: Payer: Self-pay

## 2013-06-03 DIAGNOSIS — I10 Essential (primary) hypertension: Secondary | ICD-10-CM

## 2013-06-03 NOTE — Telephone Encounter (Signed)
Patient called 05/29/13 lab results given.Unable to document on 05/29/13 lab.Patient was told to stop losartan/hct.Repeat bmet and B/P check 06/05/13.Restart potassium 20 meq daily.Advised no alcohol.

## 2013-06-04 ENCOUNTER — Encounter: Payer: Self-pay | Admitting: Thoracic Surgery (Cardiothoracic Vascular Surgery)

## 2013-06-04 ENCOUNTER — Ambulatory Visit (INDEPENDENT_AMBULATORY_CARE_PROVIDER_SITE_OTHER): Payer: Self-pay | Admitting: Thoracic Surgery (Cardiothoracic Vascular Surgery)

## 2013-06-04 ENCOUNTER — Ambulatory Visit
Admission: RE | Admit: 2013-06-04 | Discharge: 2013-06-04 | Disposition: A | Discharge status: Home / Self Care | Payer: 59 | Source: Ambulatory Visit | Attending: Thoracic Surgery (Cardiothoracic Vascular Surgery) | Admitting: Thoracic Surgery (Cardiothoracic Vascular Surgery)

## 2013-06-04 VITALS — BP 98/64 | HR 61 | Resp 16 | Ht 65.0 in | Wt 161.0 lb

## 2013-06-04 DIAGNOSIS — Z951 Presence of aortocoronary bypass graft: Secondary | ICD-10-CM

## 2013-06-04 DIAGNOSIS — I2510 Atherosclerotic heart disease of native coronary artery without angina pectoris: Secondary | ICD-10-CM

## 2013-06-04 DIAGNOSIS — Z09 Encounter for follow-up examination after completed treatment for conditions other than malignant neoplasm: Secondary | ICD-10-CM

## 2013-06-04 NOTE — Progress Notes (Signed)
HPI:  Douglas Christensen returns today for a scheduled postoperative followup visit. He had coronary bypass grafting x5 on June 10. He has a history of heavy ethanol use and was having some issues with agitation early on. He was treated with Librium taper. He did not experience any DTs. Other than that he did quite well.  He says that he feels well. He has been driving a limited basis. He's really not having any significant pain. He has not had a drink in 3 weeks. He says he does take an Ultram at night when he is uncomfortable to help him rest. He's not had any anginal discomfort. He did have to stop his losartan due to a combination of hypotension and rising creatinine. He has an appointment with Dr. Burt Knack tomorrow to have lab tests done to check on his renal function  Past Medical History  Diagnosis Date  . History of colonic polyps   . COPD (chronic obstructive pulmonary disease)   . CAD (coronary artery disease)     Mild plaque (cath "years ago"); abnormal Myoview 04/2013 with subsequent CABG x 5 with LIMA to LAD, SVG to OM1, SVG to DX, SVG to PD & PL.   Marland Kitchen GERD (gastroesophageal reflux disease)   . Hyperlipidemia   . Hypertension   . Vitamin D deficiency   . Meralgia paresthetica of left side 2011  . LBP (low back pain)   . PVD (peripheral vascular disease)     Stent to left common femoral and right superficial femoral.  2008.  50%  left renal   . Sleep apnea     mod OSA, central sleep apnea/hypoapnea syndrome 11/22/12  . Shortness of breath   '   Current Outpatient Prescriptions  Medication Sig Dispense Refill  . aspirin EC 325 MG EC tablet Take 1 tablet (325 mg total) by mouth daily.  30 tablet  0  . atenolol (TENORMIN) 50 MG tablet Take 0.5 tablets (25 mg total) by mouth daily.  90 tablet  0  . atorvastatin (LIPITOR) 20 MG tablet Take 1 tablet by mouth daily.      . budesonide-formoterol (SYMBICORT) 160-4.5 MCG/ACT inhaler Inhale 2 puffs into the lungs 2 (two) times daily as needed  (for shortness of breath).      . cholecalciferol (VITAMIN D) 1000 UNITS tablet Take 1,000 Units by mouth daily.        Marland Kitchen docusate sodium 100 MG CAPS Take 200 mg by mouth 2 (two) times daily as needed for constipation.  10 capsule  0  . potassium chloride SA (K-DUR,KLOR-CON) 20 MEQ tablet Take 1 tablet (20 mEq total) by mouth daily.  30 tablet  6  . ranitidine (ZANTAC) 150 MG tablet Take 150 mg by mouth daily as needed. For heartburn       . sildenafil (VIAGRA) 100 MG tablet Take 100 mg by mouth daily as needed. For erectile dysfunction      . STUDY MEDICATION Take 1 tablet by mouth daily. "[employer]      . STUDY MEDICATION Take 1 tablet by mouth 2 (two) times daily. "[employer]      . traMADol (ULTRAM) 50 MG tablet Take 1-2 tablets (50-100 mg total) by mouth every 6 (six) hours as needed for pain.  40 tablet  0  . vitamin B-12 (CYANOCOBALAMIN) 1000 MCG tablet Take 1,000 mcg by mouth daily.       No current facility-administered medications for this visit.    Physical  Exam BP 98/64  Pulse 61  Resp 16  Ht 5\' 5"  (1.651 m)  Wt 161 lb (73.029 kg)  BMI 26.79 kg/m2  SpO71 34% 62 year old male in no acute distress Neurologic alert and oriented x3 Lungs clear with equal breath sounds bilaterally Cardiac regular rate and rhythm normal S1 and S2 Sternum stable, incision clean dry and intact Leg incisions well healed, no peripheral edema  Diagnostic Tests: Chest x-ray 06/04/2013 *RADIOLOGY REPORT*  Clinical Data: Status post heart surgery in June 2014. Short of  breath.  CHEST - 2 VIEW  Comparison: 05/09/2013  Findings: There is a minimal left pleural effusion. The lungs are  mildly hyperexpanded. No infiltrate or edema. Lung base  atelectasis seen previously has resolved. No pneumothorax.  The cardiac silhouette is normal in size and configuration. No  mediastinal or hilar masses. The aorta is mildly uncoiled and  tortuous.  The bony thorax is  demineralized but intact.  IMPRESSION:  No acute cardiopulmonary disease. Lung base atelectasis seen on  the prior study has resolved. There is a minimal residual left  pleural effusion.  Original Report Authenticated By: Lajean Manes, M.D.   Impression: 62 year old gentleman now about a month out from coronary bypass grafting. He is doing very well at this point in time. He says that he has not been drinking for almost 3 weeks. He is having minimal discomfort and is only using Ultram on rare occasions. He's not had any recurrent angina.  He questions about whether he would be able to return to work. He needs to be out for 12 weeks postoperatively.  He may begin driving, appropriate precautions were discussed.  He's not to lift anything over 10 pounds for another 2 weeks.  He has an appointment with Dr. Burt Knack later this week to recheck his renal function.  Plan: I will be happy to see Douglas Christensen back any time the future if I can be of any further assistance with his care.

## 2013-06-05 ENCOUNTER — Other Ambulatory Visit (INDEPENDENT_AMBULATORY_CARE_PROVIDER_SITE_OTHER): Payer: 59

## 2013-06-05 ENCOUNTER — Ambulatory Visit (INDEPENDENT_AMBULATORY_CARE_PROVIDER_SITE_OTHER): Payer: 59 | Admitting: Nurse Practitioner

## 2013-06-05 VITALS — BP 110/70 | HR 66 | Resp 18

## 2013-06-05 DIAGNOSIS — R42 Dizziness and giddiness: Secondary | ICD-10-CM

## 2013-06-05 DIAGNOSIS — I10 Essential (primary) hypertension: Secondary | ICD-10-CM

## 2013-06-05 DIAGNOSIS — E875 Hyperkalemia: Secondary | ICD-10-CM

## 2013-06-05 LAB — BASIC METABOLIC PANEL
BUN: 22 mg/dL (ref 6–23)
CO2: 24 mEq/L (ref 19–32)
Calcium: 9.6 mg/dL (ref 8.4–10.5)
Chloride: 108 mEq/L (ref 96–112)
Creatinine, Ser: 1.2 mg/dL (ref 0.4–1.5)
GFR: 68.43 mL/min (ref >60.00)
Glucose, Bld: 90 mg/dL (ref 70–99)
Potassium: 3.4 mEq/L — ABNORMAL LOW (ref 3.5–5.1)
Sodium: 141 mEq/L (ref 135–145)

## 2013-06-05 NOTE — Progress Notes (Signed)
Patient presents ambulatory from the lab where he got a repeat BMET per order due to abnormal lab work 6/27 and 7/2.  Patient is here for BP recheck to evaluate his status since stopping Losartan/HCT and hypotension reported by patient on 7/2.  Patient ambulates without difficulty; is in no acute distress; denies dizziness, light-headedness or any other complaints since stopping the Losartan/HCT.  Patient is alert & oriented x 4; states he has not had any ETOH in 3 weeks.  BP right arm 110/70, HR 66; BP left arm 108/65.  Patient advised to remain off the medication and that we will f/u with him once lab work is complete.  Patient verbalized understanding and agreement with plan of care.  Patient ambulated to discharge in no acute distress.

## 2013-06-06 ENCOUNTER — Encounter (HOSPITAL_COMMUNITY)
Admission: RE | Admit: 2013-06-06 | Discharge: 2013-06-06 | Disposition: A | Discharge status: Home / Self Care | Payer: 59 | Source: Ambulatory Visit | Attending: Cardiovascular Disease | Admitting: Cardiovascular Disease

## 2013-06-06 DIAGNOSIS — Z5189 Encounter for other specified aftercare: Secondary | ICD-10-CM | POA: Insufficient documentation

## 2013-06-06 DIAGNOSIS — G4733 Obstructive sleep apnea (adult) (pediatric): Secondary | ICD-10-CM | POA: Insufficient documentation

## 2013-06-06 DIAGNOSIS — J449 Chronic obstructive pulmonary disease, unspecified: Secondary | ICD-10-CM | POA: Insufficient documentation

## 2013-06-06 DIAGNOSIS — I2510 Atherosclerotic heart disease of native coronary artery without angina pectoris: Secondary | ICD-10-CM | POA: Insufficient documentation

## 2013-06-06 DIAGNOSIS — I10 Essential (primary) hypertension: Secondary | ICD-10-CM | POA: Insufficient documentation

## 2013-06-06 DIAGNOSIS — J4489 Other specified chronic obstructive pulmonary disease: Secondary | ICD-10-CM | POA: Insufficient documentation

## 2013-06-06 NOTE — Progress Notes (Signed)
Cardiac Rehab Medication Review by a Pharmacist  Does the patient  feel that his/her medications are working for him/her?  yes  Has the patient been experiencing any side effects to the medications prescribed?  yes  Does the patient measure his/her own blood pressure or blood glucose at home?  yes   Does the patient have any problems obtaining medications due to transportation or finances?   no  Understanding of regimen: good Understanding of indications: good Potential of compliance: good    Pharmacist comments: Patient has a good understanding of his medications. He did not have any questions at this time.     Ace Gins 06/06/2013 8:44 AM

## 2013-06-07 ENCOUNTER — Other Ambulatory Visit: Payer: Self-pay | Admitting: *Deleted

## 2013-06-07 ENCOUNTER — Telehealth: Payer: Self-pay | Admitting: Nurse Practitioner

## 2013-06-07 DIAGNOSIS — E876 Hypokalemia: Secondary | ICD-10-CM

## 2013-06-07 NOTE — Telephone Encounter (Signed)
Pt is aware of lab results. Pt states had his BP check the same day of lab work 7/9: Right arm 110/70, left arm 108/65 pulse 66 beats/minute. Pt has an appointment labs (BMET) on July 25 th 2014.

## 2013-06-07 NOTE — Telephone Encounter (Signed)
Follow up  Pt is returning a call regarding his lab work.

## 2013-06-10 ENCOUNTER — Encounter (HOSPITAL_COMMUNITY)
Admission: RE | Admit: 2013-06-10 | Discharge: 2013-06-10 | Disposition: A | Discharge status: Home / Self Care | Payer: 59 | Source: Ambulatory Visit | Attending: Cardiovascular Disease | Admitting: Cardiovascular Disease

## 2013-06-11 ENCOUNTER — Ambulatory Visit (INDEPENDENT_AMBULATORY_CARE_PROVIDER_SITE_OTHER): Payer: 59 | Admitting: Internal Medicine

## 2013-06-11 ENCOUNTER — Encounter: Payer: Self-pay | Admitting: Internal Medicine

## 2013-06-11 VITALS — BP 118/72 | HR 72 | Temp 98.2°F | Resp 16 | Wt 164.0 lb

## 2013-06-11 DIAGNOSIS — I2510 Atherosclerotic heart disease of native coronary artery without angina pectoris: Secondary | ICD-10-CM

## 2013-06-11 DIAGNOSIS — D45 Polycythemia vera: Secondary | ICD-10-CM

## 2013-06-11 DIAGNOSIS — I70219 Atherosclerosis of native arteries of extremities with intermittent claudication, unspecified extremity: Secondary | ICD-10-CM

## 2013-06-11 DIAGNOSIS — D751 Secondary polycythemia: Secondary | ICD-10-CM

## 2013-06-11 DIAGNOSIS — Z0000 Encounter for general adult medical examination without abnormal findings: Secondary | ICD-10-CM

## 2013-06-11 DIAGNOSIS — E538 Deficiency of other specified B group vitamins: Secondary | ICD-10-CM

## 2013-06-11 DIAGNOSIS — I10 Essential (primary) hypertension: Secondary | ICD-10-CM

## 2013-06-11 NOTE — Assessment & Plan Note (Signed)
Continue with current prescription therapy as reflected on the Med list. BP Readings from Last 3 Encounters:  06/11/13 118/72  06/06/13 132/64  06/05/13 110/70

## 2013-06-11 NOTE — Assessment & Plan Note (Signed)
Continue with current prescription therapy as reflected on the Med list.  

## 2013-06-11 NOTE — Assessment & Plan Note (Signed)
Check CBC 

## 2013-06-11 NOTE — Assessment & Plan Note (Signed)
He had coronary bypass grafting x5 on June 10 Better Continue with current prescription therapy as reflected on the Med list.

## 2013-06-11 NOTE — Progress Notes (Signed)
Subjective:     HPI F/u LBP: he saw Dr Annette Stable and had a CT myelogram a few months ago - no need for surgery (2013)   The patient presents for a follow-up of  Chronic PVD hypertension, chronic dyslipidemia, CAD. He is not smoking...  S/p R bypass 2014, s/p CABG - doing better  BP Readings from Last 3 Encounters:  06/11/13 118/72  06/06/13 132/64  06/05/13 110/70    Wt Readings from Last 3 Encounters:  06/11/13 164 lb (74.39 kg)  06/06/13 162 lb 4.1 oz (73.6 kg)  06/04/13 161 lb (73.029 kg)      Review of Systems  Constitutional: Positive for fatigue. Negative for appetite change and unexpected weight change.  HENT: Negative for nosebleeds, congestion, sore throat, sneezing, trouble swallowing and neck pain.   Eyes: Negative for itching and visual disturbance.  Respiratory: Positive for shortness of breath. Negative for cough and chest tightness.   Cardiovascular: Positive for chest pain. Negative for palpitations and leg swelling.  Gastrointestinal: Negative for nausea, diarrhea, blood in stool and abdominal distention.  Genitourinary: Negative for frequency and hematuria.  Musculoskeletal: Positive for back pain, arthralgias and gait problem. Negative for joint swelling.  Skin: Negative for color change and rash.  Neurological: Negative for dizziness, tremors, speech difficulty and weakness.  Psychiatric/Behavioral: Negative for suicidal ideas, confusion, sleep disturbance, dysphoric mood and agitation. The patient is not nervous/anxious.        Objective:   Physical Exam  Constitutional: He is oriented to person, place, and time. He appears well-developed. No distress.  HENT:  Mouth/Throat: Oropharynx is clear and moist.  Eyes: Conjunctivae are normal. Pupils are equal, round, and reactive to light.  Neck: Normal range of motion. No JVD present. No thyromegaly present.  Cardiovascular: Normal rate, regular rhythm, normal heart sounds and intact distal pulses.  Exam  reveals no gallop and no friction rub.   No murmur heard. Pulmonary/Chest: Effort normal and breath sounds normal. No respiratory distress. He has no wheezes. He has no rales. He exhibits no tenderness.  Abdominal: Soft. Bowel sounds are normal. He exhibits no distension and no mass. There is no tenderness. There is no rebound and no guarding.  Musculoskeletal: Normal range of motion. He exhibits tenderness (R buttock, R hip, R leg, foot). He exhibits no edema.  Lymphadenopathy:    He has no cervical adenopathy.  Neurological: He is alert and oriented to person, place, and time. He has normal reflexes. No cranial nerve deficit. He exhibits normal muscle tone. Coordination normal.  Skin: Skin is warm and dry. No rash noted.  Very tanned  Psychiatric: He has a normal mood and affect. His behavior is normal. Judgment and thought content normal.  chest and R groin scar  Lab Results  Component Value Date   WBC 8.2 05/24/2013   HGB 11.3* 05/24/2013   HCT 33.8* 05/24/2013   PLT 583.0 Repeated and verified X2.* 05/24/2013   GLUCOSE 90 06/05/2013   CHOL 236* 08/28/2012   TRIG 291.0* 08/28/2012   HDL 36.50* 08/28/2012   LDLDIRECT 159.6 08/28/2012   LDLCALC 111* 09/25/2006   ALT 35 05/03/2013   AST 33 05/03/2013   NA 141 06/05/2013   K 3.4* 06/05/2013   CL 108 06/05/2013   CREATININE 1.2 06/05/2013   BUN 22 06/05/2013   CO2 24 06/05/2013   TSH 1.93 08/28/2012   PSA 0.65 08/28/2012   INR 1.34 05/06/2013   HGBA1C 5.8* 05/03/2013  Assessment & Plan:

## 2013-06-11 NOTE — Assessment & Plan Note (Signed)
S/p R bypass 2014 Continue with current prescription therapy as reflected on the Med list.

## 2013-06-11 NOTE — Progress Notes (Signed)
Pt started cardiac rehab yesterday. Pt tolerated light exercise without difficulty. Telemetry Sinus rhythm with a first degree heart block. Vital signs stable.  Will fax yesterday ECG tracing for Dr Burt Knack to review.Douglas Christensen PR interval at his office visit was measured at .20. Will continue to monitor the patient throughout  the program.

## 2013-06-12 ENCOUNTER — Encounter (HOSPITAL_COMMUNITY)
Admission: RE | Admit: 2013-06-12 | Discharge: 2013-06-12 | Disposition: A | Discharge status: Home / Self Care | Payer: 59 | Source: Ambulatory Visit | Attending: Cardiovascular Disease | Admitting: Cardiovascular Disease

## 2013-06-14 ENCOUNTER — Encounter (HOSPITAL_COMMUNITY)
Admission: RE | Admit: 2013-06-14 | Discharge: 2013-06-14 | Disposition: A | Discharge status: Home / Self Care | Payer: 59 | Source: Ambulatory Visit | Attending: Cardiovascular Disease | Admitting: Cardiovascular Disease

## 2013-06-17 ENCOUNTER — Encounter (HOSPITAL_COMMUNITY): Payer: 59

## 2013-06-19 ENCOUNTER — Telehealth (HOSPITAL_COMMUNITY): Payer: Self-pay | Admitting: Internal Medicine

## 2013-06-19 ENCOUNTER — Encounter (HOSPITAL_COMMUNITY): Payer: 59

## 2013-06-21 ENCOUNTER — Ambulatory Visit (INDEPENDENT_AMBULATORY_CARE_PROVIDER_SITE_OTHER): Payer: 59 | Admitting: Nurse Practitioner

## 2013-06-21 ENCOUNTER — Encounter (HOSPITAL_COMMUNITY): Payer: 59

## 2013-06-21 ENCOUNTER — Encounter: Payer: Self-pay | Admitting: Nurse Practitioner

## 2013-06-21 ENCOUNTER — Other Ambulatory Visit (INDEPENDENT_AMBULATORY_CARE_PROVIDER_SITE_OTHER): Payer: 59

## 2013-06-21 VITALS — BP 150/86 | HR 56 | Ht 65.0 in | Wt 164.8 lb

## 2013-06-21 DIAGNOSIS — N189 Chronic kidney disease, unspecified: Secondary | ICD-10-CM

## 2013-06-21 DIAGNOSIS — E876 Hypokalemia: Secondary | ICD-10-CM

## 2013-06-21 DIAGNOSIS — I10 Essential (primary) hypertension: Secondary | ICD-10-CM

## 2013-06-21 DIAGNOSIS — Z951 Presence of aortocoronary bypass graft: Secondary | ICD-10-CM

## 2013-06-21 LAB — BASIC METABOLIC PANEL WITH GFR
BUN: 8 mg/dL (ref 6–23)
CO2: 26 meq/L (ref 19–32)
Calcium: 9 mg/dL (ref 8.4–10.5)
Chloride: 109 meq/L (ref 96–112)
Creatinine, Ser: 1 mg/dL (ref 0.4–1.5)
GFR: 83.27 mL/min
Glucose, Bld: 83 mg/dL (ref 70–99)
Potassium: 3.5 meq/L (ref 3.5–5.1)
Sodium: 140 meq/L (ref 135–145)

## 2013-06-21 NOTE — Patient Instructions (Addendum)
We need to check lab today  See Dr. Burt Knack in 3 months for an office visit and fasting labs  Keep walking   Stay on your current medicines  Call the [place] office at [phone] if you have any questions, problems or concerns.
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
## 2013-06-24 ENCOUNTER — Encounter: Payer: Self-pay | Admitting: Internal Medicine

## 2013-06-24 ENCOUNTER — Encounter (HOSPITAL_COMMUNITY): Payer: 59

## 2013-06-24 ENCOUNTER — Ambulatory Visit (INDEPENDENT_AMBULATORY_CARE_PROVIDER_SITE_OTHER)
Admission: RE | Admit: 2013-06-24 | Discharge: 2013-06-24 | Disposition: A | Discharge status: Home / Self Care | Payer: 59 | Source: Ambulatory Visit | Attending: Internal Medicine | Admitting: Internal Medicine

## 2013-06-24 ENCOUNTER — Ambulatory Visit (INDEPENDENT_AMBULATORY_CARE_PROVIDER_SITE_OTHER): Payer: 59 | Admitting: Internal Medicine

## 2013-06-24 DIAGNOSIS — M545 Low back pain, unspecified: Secondary | ICD-10-CM

## 2013-06-24 DIAGNOSIS — S139XXA Sprain of joints and ligaments of unspecified parts of neck, initial encounter: Secondary | ICD-10-CM

## 2013-06-24 DIAGNOSIS — S161XXA Strain of muscle, fascia and tendon at neck level, initial encounter: Secondary | ICD-10-CM | POA: Insufficient documentation

## 2013-06-24 DIAGNOSIS — G8918 Other acute postprocedural pain: Secondary | ICD-10-CM

## 2013-06-24 MED ORDER — TRAMADOL HCL 50 MG PO TABS: 50.0000 mg | ORAL_TABLET | Freq: Four times a day (QID) | ORAL | Status: DC | PRN

## 2013-06-24 MED ORDER — CICLOPIROX 8 % EX SOLN: Freq: Every day | CUTANEOUS | Status: DC
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
## 2013-06-24 NOTE — Progress Notes (Signed)
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 The current episode started in the past 7 days (06/17/13 5 pm). Associated symptoms include arthralgias, chest pain, fatigue, neck pain and vertigo. Pertinent negatives include no abdominal pain, congestion, coughing, fever, joint swelling, nausea, rash, sore throat, vomiting or weakness. Associated symptoms comments: LBP, HA. The symptoms are aggravated by bending. He has tried rest (tramadol) for the symptoms. The treatment provided mild relief.   F/u LBP: he saw Dr Annette Stable and had a CT myelogram a few months ago - no need for surgery (2013)   The patient presents for a follow-up of  Chronic PVD hypertension, chronic dyslipidemia, CAD. He is not smoking...  S/p R bypass 2014, s/p CABG - doing better  BP Readings from Last 3 Encounters:  06/24/13 140/76  06/21/13 150/86  06/11/13 118/72    Wt Readings from Last 3 Encounters:  06/24/13 166 lb (75.297 kg)  06/21/13 164 lb 12.8 oz (74.753 kg)  06/11/13 164 lb (74.39 kg)      Review of Systems  Constitutional: Positive for fatigue. Negative for fever, appetite change and unexpected weight change.  HENT: Positive for neck pain. Negative for nosebleeds, congestion, sore throat, sneezing and trouble swallowing.   Eyes: Negative for itching and visual disturbance.  Respiratory: Positive for shortness of breath. Negative for cough and chest tightness.   Cardiovascular: Positive for chest pain. Negative for palpitations and leg swelling.  Gastrointestinal: Negative for nausea, vomiting, abdominal pain, diarrhea, blood in stool and abdominal distention.  Genitourinary: Negative for frequency and hematuria.  Musculoskeletal: Positive for back pain, arthralgias and gait problem. Negative for joint swelling.  Skin: Negative for color change and rash.  Neurological: Positive for vertigo. Negative for dizziness, tremors, speech difficulty and weakness.  Psychiatric/Behavioral: Negative  for suicidal ideas, confusion, sleep disturbance, dysphoric mood and agitation. The patient is not nervous/anxious.        Objective:   Physical Exam  Constitutional: He is oriented to person, place, and time. He appears well-developed. No distress.  HENT:  Mouth/Throat: Oropharynx is clear and moist.  Eyes: Conjunctivae are normal. Pupils are equal, round, and reactive to light.  Neck: Normal range of motion. No JVD present. No thyromegaly present.  Cardiovascular: Normal rate, regular rhythm, normal heart sounds and intact distal pulses.  Exam reveals no gallop and no friction rub.   No murmur heard. Pulmonary/Chest: Effort normal and breath sounds normal. No respiratory distress. He has no wheezes. He has no rales. He exhibits no tenderness.  Abdominal: Soft. Bowel sounds are normal. He exhibits no distension and no mass. There is no tenderness. There is no rebound and no guarding.  Musculoskeletal: Normal range of motion. He exhibits tenderness (R buttock, R hip, R leg, foot). He exhibits no edema.  Lymphadenopathy:    He has no cervical adenopathy.  Neurological: He is alert and oriented to person, place, and time. He has normal reflexes. No cranial nerve deficit. He exhibits normal muscle tone. Coordination normal.  Skin: Skin is warm and dry. No rash noted.  Very tanned  Psychiatric: He has a normal mood and affect. His behavior is normal. Judgment and thought content normal.  chest and R groin scar  Lab Results  Component Value Date   WBC 8.2 05/24/2013   HGB 11.3* 05/24/2013   HCT 33.8* 05/24/2013   PLT 583.0 Repeated and verified X2.* 05/24/2013   GLUCOSE 83 06/21/2013   CHOL 236* 08/28/2012   TRIG 291.0*  08/28/2012   HDL 36.50* 08/28/2012   LDLDIRECT 159.6 08/28/2012   LDLCALC 111* 09/25/2006   ALT 35 05/03/2013   AST 33 05/03/2013   NA 140 06/21/2013   K 3.5 06/21/2013   CL 109 06/21/2013   CREATININE 1.0 06/21/2013   BUN 8 06/21/2013   CO2 26 06/21/2013   TSH 1.93 08/28/2012    PSA 0.65 08/28/2012   INR 1.34 05/06/2013   HGBA1C 5.8* 05/03/2013   A complex case       Assessment & Plan:
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
## 2013-06-25 ENCOUNTER — Encounter: Payer: Self-pay | Admitting: Cardiovascular Disease
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
## 2013-06-26 ENCOUNTER — Encounter (HOSPITAL_COMMUNITY): Payer: 59

## 2013-06-28 ENCOUNTER — Encounter (HOSPITAL_COMMUNITY): Payer: 59

## 2013-07-01 ENCOUNTER — Encounter (HOSPITAL_COMMUNITY): Payer: 59

## 2013-07-02 ENCOUNTER — Telehealth (HOSPITAL_COMMUNITY): Payer: Self-pay | Admitting: *Deleted

## 2013-07-02 ENCOUNTER — Telehealth: Payer: Self-pay | Admitting: *Deleted

## 2013-07-02 NOTE — Telephone Encounter (Signed)
Pt called requesting whether Dr Alain Marion had ordered a chest Xray on him.  In chart review there is no Chest Xray ordered by Dr Alain Marion.  Unable to reach pt or leave VM.

## 2013-07-03 ENCOUNTER — Telehealth: Payer: Self-pay | Admitting: *Deleted

## 2013-07-03 ENCOUNTER — Encounter (HOSPITAL_COMMUNITY): Payer: 59

## 2013-07-03 ENCOUNTER — Telehealth: Payer: Self-pay | Admitting: Internal Medicine

## 2013-07-03 NOTE — Telephone Encounter (Signed)
Pt came in advised no chest Xray ordered by Dr Alain Marion.

## 2013-07-03 NOTE — Telephone Encounter (Signed)
error 
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
## 2013-07-04 ENCOUNTER — Encounter: Payer: Self-pay | Admitting: Internal Medicine

## 2013-07-04 ENCOUNTER — Ambulatory Visit (INDEPENDENT_AMBULATORY_CARE_PROVIDER_SITE_OTHER): Payer: 59 | Admitting: Internal Medicine

## 2013-07-04 ENCOUNTER — Ambulatory Visit (INDEPENDENT_AMBULATORY_CARE_PROVIDER_SITE_OTHER)
Admission: RE | Admit: 2013-07-04 | Discharge: 2013-07-04 | Disposition: A | Discharge status: Home / Self Care | Payer: 59 | Source: Ambulatory Visit | Attending: Internal Medicine | Admitting: Internal Medicine

## 2013-07-04 VITALS — BP 128/78 | HR 72 | Temp 98.5°F | Resp 16 | Wt 166.0 lb

## 2013-07-04 DIAGNOSIS — R071 Chest pain on breathing: Secondary | ICD-10-CM

## 2013-07-04 DIAGNOSIS — M545 Low back pain, unspecified: Secondary | ICD-10-CM

## 2013-07-04 DIAGNOSIS — Z5189 Encounter for other specified aftercare: Secondary | ICD-10-CM

## 2013-07-04 DIAGNOSIS — F17200 Nicotine dependence, unspecified, uncomplicated: Secondary | ICD-10-CM

## 2013-07-04 DIAGNOSIS — R0789 Other chest pain: Secondary | ICD-10-CM | POA: Insufficient documentation

## 2013-07-04 DIAGNOSIS — S161XXD Strain of muscle, fascia and tendon at neck level, subsequent encounter: Secondary | ICD-10-CM

## 2013-07-04 DIAGNOSIS — R079 Chest pain, unspecified: Secondary | ICD-10-CM

## 2013-07-04 DIAGNOSIS — G8918 Other acute postprocedural pain: Secondary | ICD-10-CM

## 2013-07-04 MED ORDER — CICLOPIROX 8 % EX SOLN: Freq: Every day | CUTANEOUS | Status: DC

## 2013-07-04 MED ORDER — LIDOCAINE 5 % EX PTCH: 1.0000 | MEDICATED_PATCH | CUTANEOUS | Status: DC

## 2013-07-04 MED ORDER — TRAMADOL HCL 50 MG PO TABS: 25.0000 mg | ORAL_TABLET | Freq: Four times a day (QID) | ORAL | Status: DC | PRN

## 2013-07-04 NOTE — Addendum Note (Signed)
Addended by: Cassandria Anger on: 07/04/2013 11:22 PM   Modules accepted: Level of Service

## 2013-07-04 NOTE — Assessment & Plan Note (Signed)
Quit
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
## 2013-07-04 NOTE — Assessment & Plan Note (Signed)
He continues to have pains Continue with current prescription therapy as reflected on the Med list.

## 2013-07-04 NOTE — Progress Notes (Signed)
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 The current episode started 1 to 4 weeks ago (06/17/13 5 pm). Associated symptoms include arthralgias, chest pain, fatigue, neck pain and vertigo. Pertinent negatives include no abdominal pain, congestion, coughing, fever, joint swelling, nausea, rash, sore throat, vomiting or weakness. Associated symptoms comments: LBP, HA. The symptoms are aggravated by bending, exertion and coughing (breathing). He has tried rest (tramadol) for the symptoms. The treatment provided no relief.  His CP, neck pain and LBP are worse: can't sleep. Pain is 6/10  F/u LBP: he saw Dr Annette Stable and had a CT myelogram a few months ago - no need for surgery (2013)   The patient presents for a follow-up of  Chronic PVD hypertension, chronic dyslipidemia, CAD. He is not smoking...  S/p R bypass 2014, s/p CABG - doing better  BP Readings from Last 3 Encounters:  07/04/13 128/78  06/24/13 140/76  06/21/13 150/86    Wt Readings from Last 3 Encounters:  07/04/13 166 lb (75.297 kg)  06/24/13 166 lb (75.297 kg)  06/21/13 164 lb 12.8 oz (74.753 kg)      Review of Systems  Constitutional: Positive for fatigue. Negative for fever, appetite change and unexpected weight change.  HENT: Positive for neck pain. Negative for nosebleeds, congestion, sore throat, sneezing and trouble swallowing.   Eyes: Negative for itching and visual disturbance.  Respiratory: Positive for shortness of breath. Negative for cough and chest tightness.   Cardiovascular: Positive for chest pain. Negative for palpitations and leg swelling.  Gastrointestinal: Negative for nausea, vomiting, abdominal pain, diarrhea, blood in stool and abdominal distention.  Genitourinary: Negative for frequency and hematuria.  Musculoskeletal: Positive for back pain, arthralgias and gait problem. Negative for joint swelling.  Skin: Negative for color change and rash.  Neurological: Positive for vertigo.  Negative for dizziness, tremors, speech difficulty and weakness.  Psychiatric/Behavioral: Negative for suicidal ideas, confusion, sleep disturbance, dysphoric mood and agitation. The patient is not nervous/anxious.        Objective:   Physical Exam  Constitutional: He is oriented to person, place, and time. He appears well-developed. No distress.  HENT:  Mouth/Throat: Oropharynx is clear and moist.  Eyes: Conjunctivae are normal. Pupils are equal, round, and reactive to light.  Neck: Normal range of motion. No JVD present. No thyromegaly present.  Cardiovascular: Normal rate, regular rhythm, normal heart sounds and intact distal pulses.  Exam reveals no gallop and no friction rub.   No murmur heard. Pulmonary/Chest: Effort normal and breath sounds normal. No respiratory distress. He has no wheezes. He has no rales. He exhibits no tenderness.  Abdominal: Soft. Bowel sounds are normal. He exhibits no distension and no mass. There is no tenderness. There is no rebound and no guarding.  Musculoskeletal: Normal range of motion. He exhibits tenderness (R buttock, R hip, R leg, foot). He exhibits no edema.  Lymphadenopathy:    He has no cervical adenopathy.  Neurological: He is alert and oriented to person, place, and time. He has normal reflexes. No cranial nerve deficit. He exhibits normal muscle tone. Coordination normal.  Skin: Skin is warm and dry. No rash noted.  Very tanned  Psychiatric: He has a normal mood and affect. His behavior is normal. Judgment and thought content normal.  chest and R groin scar  Lab Results  Component Value Date   WBC 8.2 05/24/2013   HGB 11.3* 05/24/2013   HCT 33.8* 05/24/2013   PLT 583.0 Repeated and  verified X2.* 05/24/2013   GLUCOSE 83 06/21/2013   CHOL 236* 08/28/2012   TRIG 291.0* 08/28/2012   HDL 36.50* 08/28/2012   LDLDIRECT 159.6 08/28/2012   LDLCALC 111* 09/25/2006   ALT 35 05/03/2013   AST 33 05/03/2013   NA 140 06/21/2013   K 3.5 06/21/2013   CL 109  06/21/2013   CREATININE 1.0 06/21/2013   BUN 8 06/21/2013   CO2 26 06/21/2013   TSH 1.93 08/28/2012   PSA 0.65 08/28/2012   INR 1.34 05/06/2013   HGBA1C 5.8* 05/03/2013          Assessment & Plan:

## 2013-07-05 ENCOUNTER — Encounter (HOSPITAL_COMMUNITY): Payer: 59

## 2013-07-08 ENCOUNTER — Encounter (HOSPITAL_COMMUNITY): Payer: 59

## 2013-07-10 ENCOUNTER — Encounter (HOSPITAL_COMMUNITY): Payer: 59

## 2013-07-12 ENCOUNTER — Encounter (HOSPITAL_COMMUNITY): Payer: 59

## 2013-07-15 ENCOUNTER — Encounter (HOSPITAL_COMMUNITY): Payer: 59

## 2013-07-17 ENCOUNTER — Encounter (HOSPITAL_COMMUNITY): Payer: 59

## 2013-07-19 ENCOUNTER — Encounter (HOSPITAL_COMMUNITY): Payer: 59

## 2013-07-22 ENCOUNTER — Encounter (HOSPITAL_COMMUNITY): Payer: 59

## 2013-07-24 ENCOUNTER — Encounter (HOSPITAL_COMMUNITY): Payer: 59

## 2013-07-26 ENCOUNTER — Encounter (HOSPITAL_COMMUNITY): Payer: 59

## 2013-07-26 ENCOUNTER — Telehealth: Payer: Self-pay | Admitting: *Deleted

## 2013-07-26 NOTE — Telephone Encounter (Signed)
Pt called requesting a return to work letter.  States at last OV MD advised pt could not return to work on 9.10.2014.  Letter will be picked up by pt once complete.  Please advise

## 2013-07-27 NOTE — Telephone Encounter (Signed)
Could or could not? Thx

## 2013-07-29 ENCOUNTER — Encounter (HOSPITAL_COMMUNITY): Payer: 59

## 2013-07-30 NOTE — Telephone Encounter (Signed)
OK +1 mo Thx

## 2013-07-30 NOTE — Telephone Encounter (Signed)
Pt stated that at his last OV, MD told him that he would not be able to return to work on 9.10.2014 as originally stated on work note.  He is requesting another note with new return to work date.  Please advise

## 2013-07-31 ENCOUNTER — Encounter (HOSPITAL_COMMUNITY): Payer: 59

## 2013-07-31 ENCOUNTER — Encounter: Payer: Self-pay | Admitting: *Deleted

## 2013-07-31 NOTE — Telephone Encounter (Signed)
Left detailed message on VM advising note adjusted/awaiting MD signature.

## 2013-08-02 ENCOUNTER — Other Ambulatory Visit: Payer: Self-pay | Admitting: Internal Medicine

## 2013-08-02 ENCOUNTER — Encounter (HOSPITAL_COMMUNITY): Payer: 59

## 2013-08-05 ENCOUNTER — Encounter (HOSPITAL_COMMUNITY): Payer: 59

## 2013-08-07 ENCOUNTER — Encounter (HOSPITAL_COMMUNITY): Payer: 59

## 2013-08-09 ENCOUNTER — Encounter (HOSPITAL_COMMUNITY): Payer: 59

## 2013-08-12 ENCOUNTER — Encounter (HOSPITAL_COMMUNITY): Payer: 59

## 2013-08-14 ENCOUNTER — Encounter (HOSPITAL_COMMUNITY): Payer: 59

## 2013-08-16 ENCOUNTER — Encounter (HOSPITAL_COMMUNITY): Payer: 59

## 2013-08-19 ENCOUNTER — Encounter (HOSPITAL_COMMUNITY): Payer: 59

## 2013-08-21 ENCOUNTER — Encounter (HOSPITAL_COMMUNITY): Payer: 59

## 2013-08-23 ENCOUNTER — Encounter (HOSPITAL_COMMUNITY): Payer: 59

## 2013-08-26 ENCOUNTER — Encounter (HOSPITAL_COMMUNITY): Payer: Self-pay | Admitting: Emergency Medicine

## 2013-08-26 ENCOUNTER — Encounter (HOSPITAL_COMMUNITY): Payer: 59

## 2013-08-26 ENCOUNTER — Emergency Department (HOSPITAL_COMMUNITY): Payer: 59

## 2013-08-26 ENCOUNTER — Observation Stay (HOSPITAL_COMMUNITY)
Admission: EM | Admit: 2013-08-26 | Discharge: 2013-08-27 | Disposition: A | Discharge status: Home / Self Care | Payer: 59 | Attending: Internal Medicine | Admitting: Internal Medicine

## 2013-08-26 DIAGNOSIS — Z860100 Personal history of colon polyps, unspecified: Secondary | ICD-10-CM

## 2013-08-26 DIAGNOSIS — E559 Vitamin D deficiency, unspecified: Secondary | ICD-10-CM

## 2013-08-26 DIAGNOSIS — G4731 Primary central sleep apnea: Secondary | ICD-10-CM | POA: Diagnosis present

## 2013-08-26 DIAGNOSIS — T148XXA Other injury of unspecified body region, initial encounter: Secondary | ICD-10-CM

## 2013-08-26 DIAGNOSIS — I10 Essential (primary) hypertension: Secondary | ICD-10-CM

## 2013-08-26 DIAGNOSIS — G4733 Obstructive sleep apnea (adult) (pediatric): Secondary | ICD-10-CM

## 2013-08-26 DIAGNOSIS — R209 Unspecified disturbances of skin sensation: Secondary | ICD-10-CM

## 2013-08-26 DIAGNOSIS — J209 Acute bronchitis, unspecified: Secondary | ICD-10-CM

## 2013-08-26 DIAGNOSIS — I70219 Atherosclerosis of native arteries of extremities with intermittent claudication, unspecified extremity: Secondary | ICD-10-CM

## 2013-08-26 DIAGNOSIS — M545 Low back pain, unspecified: Secondary | ICD-10-CM

## 2013-08-26 DIAGNOSIS — I160 Hypertensive urgency: Secondary | ICD-10-CM

## 2013-08-26 DIAGNOSIS — B351 Tinea unguium: Secondary | ICD-10-CM

## 2013-08-26 DIAGNOSIS — M549 Dorsalgia, unspecified: Secondary | ICD-10-CM | POA: Diagnosis present

## 2013-08-26 DIAGNOSIS — I2510 Atherosclerotic heart disease of native coronary artery without angina pectoris: Secondary | ICD-10-CM

## 2013-08-26 DIAGNOSIS — IMO0002 Reserved for concepts with insufficient information to code with codable children: Secondary | ICD-10-CM

## 2013-08-26 DIAGNOSIS — G4739 Other sleep apnea: Secondary | ICD-10-CM | POA: Diagnosis present

## 2013-08-26 DIAGNOSIS — R0600 Dyspnea, unspecified: Secondary | ICD-10-CM

## 2013-08-26 DIAGNOSIS — K219 Gastro-esophageal reflux disease without esophagitis: Secondary | ICD-10-CM

## 2013-08-26 DIAGNOSIS — M79609 Pain in unspecified limb: Secondary | ICD-10-CM

## 2013-08-26 DIAGNOSIS — Z951 Presence of aortocoronary bypass graft: Secondary | ICD-10-CM

## 2013-08-26 DIAGNOSIS — R5381 Other malaise: Secondary | ICD-10-CM

## 2013-08-26 DIAGNOSIS — R0789 Other chest pain: Secondary | ICD-10-CM

## 2013-08-26 DIAGNOSIS — M5416 Radiculopathy, lumbar region: Secondary | ICD-10-CM

## 2013-08-26 DIAGNOSIS — R197 Diarrhea, unspecified: Secondary | ICD-10-CM

## 2013-08-26 DIAGNOSIS — E538 Deficiency of other specified B group vitamins: Secondary | ICD-10-CM

## 2013-08-26 DIAGNOSIS — E785 Hyperlipidemia, unspecified: Secondary | ICD-10-CM

## 2013-08-26 DIAGNOSIS — I739 Peripheral vascular disease, unspecified: Secondary | ICD-10-CM

## 2013-08-26 DIAGNOSIS — R079 Chest pain, unspecified: Principal | ICD-10-CM

## 2013-08-26 DIAGNOSIS — D485 Neoplasm of uncertain behavior of skin: Secondary | ICD-10-CM

## 2013-08-26 DIAGNOSIS — R0602 Shortness of breath: Secondary | ICD-10-CM | POA: Insufficient documentation

## 2013-08-26 DIAGNOSIS — R42 Dizziness and giddiness: Secondary | ICD-10-CM

## 2013-08-26 DIAGNOSIS — Z0000 Encounter for general adult medical examination without abnormal findings: Secondary | ICD-10-CM

## 2013-08-26 DIAGNOSIS — D751 Secondary polycythemia: Secondary | ICD-10-CM

## 2013-08-26 DIAGNOSIS — F528 Other sexual dysfunction not due to a substance or known physiological condition: Secondary | ICD-10-CM

## 2013-08-26 DIAGNOSIS — F17200 Nicotine dependence, unspecified, uncomplicated: Secondary | ICD-10-CM

## 2013-08-26 LAB — BASIC METABOLIC PANEL
CO2: 20 mEq/L (ref 19–32)
Calcium: 8.8 mg/dL (ref 8.4–10.5)
GFR calc Af Amer: >90 mL/min (ref >90)
GFR calc non Af Amer: >90 mL/min (ref >90)
Glucose, Bld: 101 mg/dL — ABNORMAL HIGH (ref 70–99)
Potassium: 3.3 mEq/L — ABNORMAL LOW (ref 3.5–5.1)
Sodium: 140 mEq/L (ref 135–145)

## 2013-08-26 LAB — CBC
Hemoglobin: 13.9 g/dL (ref 13.0–17.0)
Hemoglobin: 14.2 g/dL (ref 13.0–17.0)
MCH: 32.1 pg (ref 26.0–34.0)
MCH: 31.7 pg (ref 26.0–34.0)
MCHC: 35.4 g/dL (ref 30.0–36.0)
Platelets: 192 10*3/uL (ref 150–400)
Platelets: 190 10*3/uL (ref 150–400)
RBC: 4.33 MIL/uL (ref 4.22–5.81)
RBC: 4.48 MIL/uL (ref 4.22–5.81)
RDW: 14.6 % (ref 11.5–15.5)
WBC: 5.7 10*3/uL (ref 4.0–10.5)
WBC: 7.4 10*3/uL (ref 4.0–10.5)

## 2013-08-26 LAB — LIPID PANEL
Cholesterol: 152 mg/dL (ref 0–200)
VLDL: 25 mg/dL (ref 0–40)

## 2013-08-26 LAB — TROPONIN I
Troponin I: <0.3 ng/mL (ref <0.30)
Troponin I: <0.3 ng/mL (ref <0.30)
Troponin I: <0.3 ng/mL (ref <0.30)

## 2013-08-26 LAB — CREATININE, SERUM: GFR calc non Af Amer: >90 mL/min (ref >90)

## 2013-08-26 MED ORDER — BUDESONIDE-FORMOTEROL FUMARATE 160-4.5 MCG/ACT IN AERO: 2.0000 | INHALATION_SPRAY | Freq: Two times a day (BID) | RESPIRATORY_TRACT | Status: DC | PRN

## 2013-08-26 MED ORDER — VITAMIN D3 25 MCG (1000 UNIT) PO TABS
1000.0000 [IU] | ORAL_TABLET | Freq: Every day | ORAL | Status: DC
  Administered 2013-08-27: 1000 [IU] via ORAL
  Filled 2013-08-26 (×2): qty 1

## 2013-08-26 MED ORDER — LIDOCAINE 5 % EX PTCH
1.0000 | MEDICATED_PATCH | CUTANEOUS | Status: DC
  Filled 2013-08-26 (×2): qty 2

## 2013-08-26 MED ORDER — TRAMADOL HCL 50 MG PO TABS
25.0000 mg | ORAL_TABLET | Freq: Four times a day (QID) | ORAL | Status: DC | PRN
  Administered 2013-08-26: 50 mg via ORAL
  Filled 2013-08-26 (×2): qty 1

## 2013-08-26 MED ORDER — ONDANSETRON HCL 4 MG/2ML IJ SOLN: 4.0000 mg | Freq: Four times a day (QID) | INTRAMUSCULAR | Status: DC | PRN

## 2013-08-26 MED ORDER — SODIUM CHLORIDE 0.9 % IJ SOLN: 3.0000 mL | Freq: Two times a day (BID) | INTRAMUSCULAR | Status: DC

## 2013-08-26 MED ORDER — MORPHINE SULFATE 2 MG/ML IJ SOLN: 2.0000 mg | INTRAMUSCULAR | Status: DC | PRN

## 2013-08-26 MED ORDER — ATORVASTATIN CALCIUM 20 MG PO TABS
20.0000 mg | ORAL_TABLET | Freq: Every day | ORAL | Status: DC
  Administered 2013-08-27: 20 mg via ORAL
  Filled 2013-08-26: qty 1

## 2013-08-26 MED ORDER — HEPARIN SODIUM (PORCINE) 5000 UNIT/ML IJ SOLN
5000.0000 [IU] | Freq: Three times a day (TID) | INTRAMUSCULAR | Status: DC
  Administered 2013-08-26 – 2013-08-27 (×2): 5000 [IU] via SUBCUTANEOUS
  Filled 2013-08-26 (×6): qty 1

## 2013-08-26 MED ORDER — SODIUM CHLORIDE 0.9 % IV SOLN: 250.0000 mL | INTRAVENOUS | Status: DC | PRN

## 2013-08-26 MED ORDER — ASPIRIN EC 325 MG PO TBEC
325.0000 mg | DELAYED_RELEASE_TABLET | Freq: Every day | ORAL | Status: DC
  Administered 2013-08-27: 325 mg via ORAL
  Filled 2013-08-26: qty 1

## 2013-08-26 MED ORDER — VITAMIN B-12 1000 MCG PO TABS
1000.0000 ug | ORAL_TABLET | Freq: Every day | ORAL | Status: DC
  Administered 2013-08-27: 1000 ug via ORAL
  Filled 2013-08-26: qty 1

## 2013-08-26 MED ORDER — ONDANSETRON HCL 4 MG PO TABS: 4.0000 mg | ORAL_TABLET | Freq: Four times a day (QID) | ORAL | Status: DC | PRN

## 2013-08-26 MED ORDER — HYDRALAZINE HCL 20 MG/ML IJ SOLN
5.0000 mg | INTRAMUSCULAR | Status: DC | PRN
  Administered 2013-08-27: 5 mg via INTRAVENOUS
  Filled 2013-08-26: qty 1

## 2013-08-26 MED ORDER — SODIUM CHLORIDE 0.9 % IJ SOLN: 3.0000 mL | INTRAMUSCULAR | Status: DC | PRN

## 2013-08-26 MED ORDER — SODIUM CHLORIDE 0.9 % IJ SOLN
3.0000 mL | Freq: Two times a day (BID) | INTRAMUSCULAR | Status: DC
  Administered 2013-08-26: 3 mL via INTRAVENOUS

## 2013-08-26 MED ORDER — ATENOLOL 50 MG PO TABS
50.0000 mg | ORAL_TABLET | Freq: Every day | ORAL | Status: DC
  Administered 2013-08-27: 50 mg via ORAL
  Filled 2013-08-26: qty 1

## 2013-08-26 MED ORDER — AMLODIPINE BESYLATE 5 MG PO TABS
5.0000 mg | ORAL_TABLET | Freq: Every day | ORAL | Status: DC
  Administered 2013-08-27: 5 mg via ORAL
  Filled 2013-08-26 (×2): qty 1
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
## 2013-08-26 NOTE — ED Notes (Signed)
Per EMS - pt c/o feeling like his BP was elevated around 10am, was reading very high on his home monitor, pt thought it wasn't reading it correctly so he went to [facility] to have it checked, BP was reading in 200s. Called ems, upon arrival pt's BP was 200/110. EMS started a 20G in left anterior hand, administered 324 ASA and 2 Nitro. BP decreased to 128/90. 12 lead unremarkable. Pt reports he was having some chest tightness right before EMS arrived, it has resolved at this time. Pt also sts initially he felt lightheaded but that is now gone also. Pt in nad, skin warm and dry, resp e/u.

## 2013-08-26 NOTE — ED Notes (Signed)
Family at bedside. 

## 2013-08-26 NOTE — ED Notes (Signed)
Pt returned from radiology.

## 2013-08-26 NOTE — ED Provider Notes (Signed)
Medical screening examination/treatment/procedure(s) were performed by non-physician practitioner and as supervising physician I was immediately available for consultation/collaboration.  [place], DO 08/26/13 [phone]

## 2013-08-27 ENCOUNTER — Ambulatory Visit: Payer: 59 | Admitting: Internal Medicine

## 2013-08-27 DIAGNOSIS — J209 Acute bronchitis, unspecified: Secondary | ICD-10-CM

## 2013-08-27 DIAGNOSIS — I10 Essential (primary) hypertension: Secondary | ICD-10-CM

## 2013-08-27 DIAGNOSIS — K219 Gastro-esophageal reflux disease without esophagitis: Secondary | ICD-10-CM

## 2013-08-27 DIAGNOSIS — I160 Hypertensive urgency: Secondary | ICD-10-CM

## 2013-08-27 DIAGNOSIS — Z0279 Encounter for issue of other medical certificate: Secondary | ICD-10-CM

## 2013-08-27 LAB — COMPREHENSIVE METABOLIC PANEL
Albumin: 3.7 g/dL (ref 3.5–5.2)
Alkaline Phosphatase: 70 U/L (ref 39–117)
BUN: 11 mg/dL (ref 6–23)
CO2: 21 mEq/L (ref 19–32)
Chloride: 104 mEq/L (ref 96–112)
GFR calc Af Amer: >90 mL/min (ref >90)
GFR calc non Af Amer: >90 mL/min (ref >90)
Glucose, Bld: 85 mg/dL (ref 70–99)
Potassium: 3.1 mEq/L — ABNORMAL LOW (ref 3.5–5.1)
Total Bilirubin: 0.6 mg/dL (ref 0.3–1.2)

## 2013-08-27 LAB — PROTIME-INR: Prothrombin Time: 13 seconds (ref 11.6–15.2)

## 2013-08-27 LAB — TROPONIN I: Troponin I: <0.3 ng/mL (ref <0.30)

## 2013-08-27 LAB — APTT: aPTT: 30 seconds (ref 24–37)

## 2013-08-27 MED ORDER — ISOSORBIDE MONONITRATE 15 MG HALF TABLET: 15.0000 mg | ORAL_TABLET | Freq: Every day | ORAL | Status: DC

## 2013-08-27 MED ORDER — ISOSORBIDE MONONITRATE 15 MG HALF TABLET
15.0000 mg | ORAL_TABLET | Freq: Every day | ORAL | Status: DC
  Filled 2013-08-27: qty 1

## 2013-08-27 MED ORDER — AMLODIPINE BESYLATE 10 MG PO TABS: 10.0000 mg | ORAL_TABLET | Freq: Every day | ORAL | Status: DC

## 2013-08-27 MED ORDER — AMLODIPINE BESYLATE 5 MG PO TABS
5.0000 mg | ORAL_TABLET | Freq: Once | ORAL | Status: AC
  Administered 2013-08-27: 5 mg via ORAL
  Filled 2013-08-27: qty 1

## 2013-08-27 MED ORDER — AMLODIPINE BESYLATE 5 MG PO TABS: 5.0000 mg | ORAL_TABLET | Freq: Every day | ORAL | Status: DC

## 2013-08-27 NOTE — Progress Notes (Signed)
    Subjective:  No chest pain or dyspnea. Feeling better today.  Objective:  Vital Signs in the last 24 hours: Temp:  [98 F (36.7 C)-98.1 F (36.7 C)] 98 F (36.7 C) (09/30 0500) Pulse Rate:  [59-72] 72 (09/30 0942) Resp:  [17-18] 18 (09/30 0500) BP: (144-190)/(54-90) 190/88 mmHg (09/30 1400) SpO2:  [98 %-100 %] 99 % (09/30 0500) Weight:  [77.111 kg (170 lb)] 77.111 kg (170 lb) (09/30 0743)  Intake/Output from previous day:    Physical Exam: Pt is alert and oriented, NAD HEENT: normal Neck: JVP - normal Lungs: CTA bilaterally CV: RRR without murmur or gallop Abd: soft, NT, Positive BS, no hepatomegaly Ext: no C/C/E Skin: warm/dry no rash   Lab Results:  Recent Labs  08/26/13 1229 08/26/13 1745  WBC 5.7 7.4  HGB 13.9 14.2  PLT 192 190    Recent Labs  08/26/13 1229 08/26/13 1745 08/27/13 0430  NA 140  --  139  K 3.3*  --  3.1*  CL 106  --  104  CO2 20  --  21  GLUCOSE 101*  --  85  BUN 8  --  11  CREATININE 0.72 0.85 0.74    Recent Labs  08/26/13 2247 08/27/13 0430  TROPONINI <0.30 <0.30   Assessment/Plan:  1. 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 Continue atenolol. Agree with norvasc and would increase dose to 10 mg daily.  2. CAD s/p CABG. Continue current Rx as above. Pt on ASA 325 mg daily.  Remains off cigarettes. Has follow-up with Truitt Merle scheduled in a few weeks. Discussed need for alcohol reduction.  Sherren Mocha, M.D. 08/27/2013, 2:17 PM
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
## 2013-08-28 ENCOUNTER — Encounter (HOSPITAL_COMMUNITY): Payer: 59

## 2013-08-30 ENCOUNTER — Encounter (HOSPITAL_COMMUNITY): Payer: 59

## 2013-09-02 ENCOUNTER — Encounter (HOSPITAL_COMMUNITY): Payer: 59

## 2013-09-02 ENCOUNTER — Telehealth: Payer: Self-pay | Admitting: *Deleted

## 2013-09-02 NOTE — Telephone Encounter (Signed)
Douglas Christensen called requesting last office visit notes.  Please fax to [phone]

## 2013-09-03 ENCOUNTER — Ambulatory Visit (INDEPENDENT_AMBULATORY_CARE_PROVIDER_SITE_OTHER): Payer: 59 | Admitting: Internal Medicine

## 2013-09-03 ENCOUNTER — Encounter: Payer: Self-pay | Admitting: Internal Medicine

## 2013-09-03 VITALS — BP 148/80 | HR 64 | Temp 97.6°F | Resp 16 | Wt 177.0 lb

## 2013-09-03 DIAGNOSIS — I2510 Atherosclerotic heart disease of native coronary artery without angina pectoris: Secondary | ICD-10-CM

## 2013-09-03 DIAGNOSIS — I10 Essential (primary) hypertension: Secondary | ICD-10-CM

## 2013-09-03 DIAGNOSIS — S161XXS Strain of muscle, fascia and tendon at neck level, sequela: Secondary | ICD-10-CM

## 2013-09-03 DIAGNOSIS — E538 Deficiency of other specified B group vitamins: Secondary | ICD-10-CM

## 2013-09-03 DIAGNOSIS — M545 Low back pain, unspecified: Secondary | ICD-10-CM

## 2013-09-03 DIAGNOSIS — IMO0002 Reserved for concepts with insufficient information to code with codable children: Secondary | ICD-10-CM
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
## 2013-09-03 NOTE — Assessment & Plan Note (Addendum)
Continue with current prescription therapy as reflected on the Med list.  BP is better - unable to work yet

## 2013-09-03 NOTE — Assessment & Plan Note (Signed)
Continue with current prescription therapy as reflected on the Med list.  

## 2013-09-03 NOTE — Assessment & Plan Note (Signed)
slightly better - unable to work yet

## 2013-09-04 ENCOUNTER — Encounter (HOSPITAL_COMMUNITY): Payer: 59

## 2013-09-05 ENCOUNTER — Encounter: Payer: Self-pay | Admitting: Cardiovascular Disease

## 2013-09-05 NOTE — Telephone Encounter (Signed)
This encounter was created in error - please disregard.

## 2013-09-05 NOTE — Telephone Encounter (Signed)
New Problem:  Pt states someone from our office called him. Pt states he does not know who. Pt would like to know if it was his nurse.

## 2013-09-06 ENCOUNTER — Encounter (HOSPITAL_COMMUNITY): Payer: 59

## 2013-09-09 ENCOUNTER — Encounter (HOSPITAL_COMMUNITY): Payer: 59

## 2013-09-10 ENCOUNTER — Other Ambulatory Visit: Payer: Self-pay | Admitting: Internal Medicine

## 2013-09-10 ENCOUNTER — Ambulatory Visit (INDEPENDENT_AMBULATORY_CARE_PROVIDER_SITE_OTHER): Payer: 59 | Admitting: Nurse Practitioner

## 2013-09-10 ENCOUNTER — Encounter: Payer: Self-pay | Admitting: Nurse Practitioner

## 2013-09-10 ENCOUNTER — Encounter: Payer: 59 | Admitting: Nurse Practitioner

## 2013-09-10 VITALS — BP 140/74 | HR 60 | Ht 65.0 in | Wt 175.1 lb

## 2013-09-10 DIAGNOSIS — I10 Essential (primary) hypertension: Secondary | ICD-10-CM

## 2013-09-10 DIAGNOSIS — I2510 Atherosclerotic heart disease of native coronary artery without angina pectoris: Secondary | ICD-10-CM

## 2013-09-10 MED ORDER — AMLODIPINE BESYLATE 10 MG PO TABS: 5.0000 mg | ORAL_TABLET | Freq: Every day | ORAL | Status: DC

## 2013-09-10 MED ORDER — HYDRALAZINE HCL 25 MG PO TABS: 25.0000 mg | ORAL_TABLET | Freq: Three times a day (TID) | ORAL | Status: DC

## 2013-09-10 NOTE — Patient Instructions (Addendum)
Stay on your current medicines but we are cutting the Norvasc in half to 5 mg  I am adding Hydralazine 25 mg to take 3 times a day - this is at the drug store  See me in one month (day that Dr. Burt Knack is here)  Call the [place] office at [phone] if you have any questions, problems or concerns.

## 2013-09-10 NOTE — Progress Notes (Signed)
[name] Date of Birth: Apr 28, 1951 Medical Record [id number]  History of Present Illness: Mr. Kammerdiener is seen back today for a post hospital visit. Seen for Dr. Burt Knack. Has CAD with recent CABG in June of 2014, PVD with prior right SFA stenting and left common iliac stenting in 2008/left external iliac stenting in 2012, GERD, HLD, HTN and OSA. He had coronary bypass grafting x5 on June 10. He has a history of heavy ethanol use and had some issues with agitation early on. He was treated with Librium taper.   Recently back in the hospital at the end of September - presented with chest heaviness and SOB, left arm numbness relieved by NTG. EKG and troponin negative. BP was up. Amlodipine was added.   Comes back today. Here alone. Says he feels better. No more chest pain. Not short of breath. Now with swelling in his legs - this just started after starting the Norvasc. BP is running up to the 170 to 180's at home. Not using too much salt. No longer on ARB/Diuretic due to worsening kidney function. He is drinking - sounds like he binges - especially at the beach.   Current Outpatient Prescriptions  Medication Sig Dispense Refill  . amLODipine (NORVASC) 10 MG tablet Take 1 tablet (10 mg total) by mouth daily.  30 tablet  3  . aspirin 325 MG EC tablet Take 325 mg by mouth daily.      Marland Kitchen atenolol (TENORMIN) 50 MG tablet Take 50 mg by mouth daily.      Marland Kitchen atorvastatin (LIPITOR) 20 MG tablet Take 1 tablet by mouth daily.      . budesonide-formoterol (SYMBICORT) 160-4.5 MCG/ACT inhaler Inhale 2 puffs into the lungs 2 (two) times daily as needed (for shortness of breath).      . cholecalciferol (VITAMIN D) 1000 UNITS tablet Take 1,000 Units by mouth daily.        . ciclopirox (PENLAC) 8 % solution Apply topically at bedtime. Apply over nail and surrounding skin. Apply daily over previous coat. After seven (7) days, may remove with alcohol and continue cycle.  6.6 mL  0  . OVER THE COUNTER MEDICATION 550 mg.       . sildenafil (VIAGRA) 100 MG tablet Take 100 mg by mouth daily as needed. For erectile dysfunction      . traMADol (ULTRAM) 50 MG tablet Take 0.5-1 tablets (25-50 mg total) by mouth every 6 (six) hours as needed for pain.  60 tablet  1  . vitamin B-12 (CYANOCOBALAMIN) 1000 MCG tablet Take 1,000 mcg by mouth daily.       No current facility-administered medications for this visit.    Allergies  Allergen Reactions  . Benazepril Cough    Past Medical History  Diagnosis Date  . History of colonic polyps   . COPD (chronic obstructive pulmonary disease)   . CAD (coronary artery disease)     Mild plaque (cath "years ago"); abnormal Myoview 04/2013 with subsequent CABG x 5 with LIMA to LAD, SVG to OM1, SVG to DX, SVG to PD & PL.   Marland Kitchen GERD (gastroesophageal reflux disease)   . Hyperlipidemia   . Hypertension   . Vitamin D deficiency   . Meralgia paresthetica of left side 2011  . LBP (low back pain)   . PVD (peripheral vascular disease)     Stent to left common femoral and right superficial femoral.  2008.  50%  left renal   . Sleep apnea  mod OSA, central sleep apnea/hypoapnea syndrome 11/22/12  . Shortness of breath     "once in awhile; can happen at anytime" (08/26/2013)    Past Surgical History  Procedure Laterality Date  . Lower extremity stents      bilateral lower extremities x 2  . Femoral-popliteal bypass graft  12/27/2012    Procedure: BYPASS GRAFT FEMORAL-POPLITEAL ARTERY;  Surgeon: Serafina Mitchell, MD;  Location: [facility] OR;  Service: Vascular;  Laterality: Right;  using non-reversed sapphenous vein.  . Cardiac catheterization  05/02/13    x2   . Coronary artery bypass graft N/A 05/06/2013    Procedure: CORONARY ARTERY BYPASS GRAFTING (CABG);  Surgeon: Melrose Nakayama, MD;  Location: [facility];  Service: Open Heart Surgery;  Laterality: N/A;  Coronary artery bypass graft times five using left internal mammary artery and left greater saphenous vein via endovein harvest.     History  Smoking status  . Former Smoker -- 2.00 packs/day for 47 years  . Types: Cigarettes  . Quit date: 12/17/2012  Smokeless tobacco  . Never Used    History  Alcohol Use  . 2.4 oz/week  . 4 Shots of liquor per week    Comment: 08/26/2013 "3-4 mixed drinks/wk"    Family History  Problem Relation Age of Onset  . Heart disease Mother     No clear CAD  . Hypertension Mother   . Cancer Mother     ? colon ca  . Hyperlipidemia Mother   . Cancer Father     brain tumor  . Alzheimer's disease Father   . Colon cancer Neg Hx     Review of Systems: The review of systems is per the HPI.  All other systems were reviewed and are negative.  Physical Exam: BP 140/74  Pulse 60  Ht 5\' 5"  (1.651 m)  Wt 175 lb 1.9 oz (79.434 kg)  BMI 29.14 kg/m2 Patient is very pleasant and in no acute distress. Skin is warm and dry. Color is normal. Very tanned.Marland Kitchen  HEENT is unremarkable. Normocephalic/atraumatic. PERRL. Sclera are nonicteric. Neck is supple. No masses. No JVD. Lungs are clear. Cardiac exam shows a regular rate and rhythm. Abdomen is soft. Extremities are with 1+ edema. Gait and ROM are intact. No gross neurologic deficits noted.  LABORATORY DATA: Lab Results  Component Value Date   WBC 7.4 08/26/2013   HGB 14.2 08/26/2013   HCT 41.3 08/26/2013   PLT 190 08/26/2013   GLUCOSE 85 08/27/2013   CHOL 152 08/26/2013   TRIG 125 08/26/2013   HDL 53 08/26/2013   LDLDIRECT 159.6 08/28/2012   LDLCALC 74 08/26/2013   ALT 28 08/27/2013   AST 33 08/27/2013   NA 139 08/27/2013   K 3.1* 08/27/2013   CL 104 08/27/2013   CREATININE 0.74 08/27/2013   BUN 11 08/27/2013   CO2 21 08/27/2013   TSH 1.93 08/28/2012   PSA 0.65 08/28/2012   INR 1.00 08/27/2013   HGBA1C 5.8* 05/03/2013    Lab Results  Component Value Date   CKTOTAL 58 10/06/2011   CKMB 2.6 10/06/2011   TROPONINI <0.30 08/27/2013     Assessment / Plan: 1. Chest pain - resolved.   2. HTN - BP not controlled - not tolerating the CCB due to  swelling - I have cut the Norvasc to just 5 mg and am adding Hydralazine 25 mg TID. Will see him back in a month. Was taken off of his ARB/HCTZ combo back in the summer due  to CKD.   3. Recent CABG x 5 from June 2014 - no further dyspnea. EKG ok today with 1st degree AV block.   I will see him in a month.   Patient is agreeable to this plan and will call if any problems develop in the interim.   [name], RN, [facility] [address] [place], [place]  [address]

## 2013-09-11 ENCOUNTER — Ambulatory Visit: Payer: 59 | Admitting: Physical Therapy

## 2013-09-11 ENCOUNTER — Encounter (HOSPITAL_COMMUNITY): Payer: 59

## 2013-09-12 ENCOUNTER — Ambulatory Visit: Payer: 59 | Attending: Internal Medicine | Admitting: Physical Therapy

## 2013-09-12 DIAGNOSIS — M545 Low back pain, unspecified: Secondary | ICD-10-CM | POA: Insufficient documentation

## 2013-09-12 DIAGNOSIS — IMO0001 Reserved for inherently not codable concepts without codable children: Secondary | ICD-10-CM | POA: Insufficient documentation

## 2013-09-12 DIAGNOSIS — R209 Unspecified disturbances of skin sensation: Secondary | ICD-10-CM | POA: Insufficient documentation

## 2013-09-12 DIAGNOSIS — R293 Abnormal posture: Secondary | ICD-10-CM | POA: Insufficient documentation

## 2013-09-12 DIAGNOSIS — M542 Cervicalgia: Secondary | ICD-10-CM | POA: Insufficient documentation

## 2013-09-13 ENCOUNTER — Encounter (HOSPITAL_COMMUNITY): Payer: 59

## 2013-09-17 ENCOUNTER — Ambulatory Visit: Payer: 59

## 2013-09-24 ENCOUNTER — Telehealth: Payer: Self-pay | Admitting: Cardiovascular Disease

## 2013-09-24 ENCOUNTER — Ambulatory Visit: Payer: 59 | Admitting: Physical Therapy

## 2013-09-24 NOTE — Telephone Encounter (Signed)
New Problem:  Pt states we should be receiving some paper work in the near future from his insurance/[employer].. and the pt would like to be notified when his doctor receives the paperwork and when it's filled out...  Also, the Pt states his BP has still been running high. Pt gave these two examples..165/70 and 175/80... Pt would like to be advised

## 2013-09-25 MED ORDER — HYDRALAZINE HCL 50 MG PO TABS: 50.0000 mg | ORAL_TABLET | Freq: Three times a day (TID) | ORAL | Status: DC
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
## 2013-09-26 ENCOUNTER — Ambulatory Visit: Payer: 59 | Admitting: Physical Therapy

## 2013-09-30 ENCOUNTER — Ambulatory Visit: Payer: 59 | Attending: Internal Medicine | Admitting: Physical Therapy

## 2013-09-30 DIAGNOSIS — M542 Cervicalgia: Secondary | ICD-10-CM | POA: Insufficient documentation

## 2013-09-30 DIAGNOSIS — M545 Low back pain, unspecified: Secondary | ICD-10-CM | POA: Insufficient documentation

## 2013-09-30 DIAGNOSIS — R209 Unspecified disturbances of skin sensation: Secondary | ICD-10-CM | POA: Insufficient documentation

## 2013-09-30 DIAGNOSIS — R293 Abnormal posture: Secondary | ICD-10-CM | POA: Insufficient documentation

## 2013-09-30 DIAGNOSIS — IMO0001 Reserved for inherently not codable concepts without codable children: Secondary | ICD-10-CM | POA: Insufficient documentation

## 2013-10-02 ENCOUNTER — Ambulatory Visit: Payer: 59 | Admitting: Physical Therapy

## 2013-10-02 NOTE — Telephone Encounter (Signed)
error 

## 2013-10-02 NOTE — Telephone Encounter (Signed)
New Problem  Pt states he is need of a Physicians statement and have it sent to the [employer] giving info on his physical condition and why he cannot work.   He states you have the information if not he will give it to you again when you call back// Declined giving scheduler the information// Please assist.

## 2013-10-02 NOTE — Telephone Encounter (Signed)
LMTCB 11/5@4 :50pm/PE Routed to Dr. Rozell Searing, RN for follow up.

## 2013-10-03 ENCOUNTER — Other Ambulatory Visit: Payer: Self-pay

## 2013-10-04 NOTE — Telephone Encounter (Signed)
I spoke with [employer] and the pt's paperwork was sent back to our office for completion yesterday. I spoke with our medical records department and they have not received these forms at this time.

## 2013-10-07 ENCOUNTER — Ambulatory Visit: Payer: 59 | Admitting: Physical Therapy

## 2013-10-08 ENCOUNTER — Ambulatory Visit (INDEPENDENT_AMBULATORY_CARE_PROVIDER_SITE_OTHER): Payer: 59 | Admitting: Nurse Practitioner

## 2013-10-08 ENCOUNTER — Encounter: Payer: Self-pay | Admitting: Nurse Practitioner

## 2013-10-08 VITALS — BP 130/78 | HR 68 | Ht 65.0 in | Wt 178.8 lb

## 2013-10-08 DIAGNOSIS — I10 Essential (primary) hypertension: Secondary | ICD-10-CM

## 2013-10-08 NOTE — Telephone Encounter (Signed)
Form completed by Dr Burt Knack and given to Maudie Mercury in Medical Records.  The pt is scheduled to see Truitt Merle NP today and Maudie Mercury will provide the pt with a copy of paperwork at that appointment.

## 2013-10-08 NOTE — Progress Notes (Signed)
Douglas Christensen Date of Birth: 07-06-1951 Medical Record [id number]  History of Present Illness: Douglas Christensen is seen back today for a one month check. Seen for Dr. Burt Knack. Has CAD with recent CABG in June of 2014, PVD with prior right SFA stenting and left common iliac stenting in 2008/left external iliac stenting in 2012, GERD, HLD, HTN and OSA. He had coronary bypass grafting x5 on June 10. He has a history of heavy ethanol use and had some issues with agitation early on. He was treated with Librium taper.   Recently back in the hospital at the end of September - presented with chest heaviness and SOB, left arm numbness relieved by NTG. EKG and troponin negative. BP was up. Amlodipine was added.   Seen a month ago for his post hospital visit - was having swelling in his legs - this just started after starting the Norvasc. BP was running up to the 170 to 180's at home.  No longer on ARB/Diuretic due to worsening kidney function. He was drinking - sounded like he binges - especially at the beach. I cut his Norvasc back and added Hydralazine. He called a few days after the visit - BP still up - Dr. Burt Knack increased his Hydralazine dose.   Comes back today. Here alone. Doing ok. Says that with the increased Hydralazine he has bad dreams, not sleeping. Remains tired. No regular exercise. Some fleeting discomfort in his chest. Does not quantify his alcohol use. BP still up at home but great here today - ?his cuff correlates. Swelling has resolved with cutting back the Norvasc. Does endorse snoring - says past sleep study was negative. He is wanting to do cardiac rehab - Dr. Burt Knack has suggested the 12 week program.    Current Outpatient Prescriptions  Medication Sig Dispense Refill  . amLODipine (NORVASC) 10 MG tablet Take 0.5 tablets (5 mg total) by mouth daily.  30 tablet  3  . aspirin 325 MG EC tablet Take 325 mg by mouth daily.      Marland Kitchen atenolol (TENORMIN) 50 MG tablet Take 50 mg by mouth daily.      Marland Kitchen  atorvastatin (LIPITOR) 20 MG tablet TAKE 1 TABLET BY MOUTH EVERY DAY  90 tablet  2  . cholecalciferol (VITAMIN D) 1000 UNITS tablet Take 1,000 Units by mouth daily.        . ciclopirox (PENLAC) 8 % solution Apply topically at bedtime. Apply over nail and surrounding skin. Apply daily over previous coat. After seven (7) days, may remove with alcohol and continue cycle.  6.6 mL  0  . hydrALAZINE (APRESOLINE) 50 MG tablet Take 1 tablet (50 mg total) by mouth 3 (three) times daily.  90 tablet  3  . OVER THE COUNTER MEDICATION 550 mg. POTASSIUM      . sildenafil (VIAGRA) 100 MG tablet Take 100 mg by mouth daily as needed. For erectile dysfunction      . traMADol (ULTRAM) 50 MG tablet Take 0.5-1 tablets (25-50 mg total) by mouth every 6 (six) hours as needed for pain.  60 tablet  1  . vitamin B-12 (CYANOCOBALAMIN) 1000 MCG tablet Take 1,000 mcg by mouth daily.      . budesonide-formoterol (SYMBICORT) 160-4.5 MCG/ACT inhaler Inhale 2 puffs into the lungs 2 (two) times daily as needed (for shortness of breath).       No current facility-administered medications for this visit.    Allergies  Allergen Reactions  . Benazepril Cough    Past  Medical History  Diagnosis Date  . History of colonic polyps   . COPD (chronic obstructive pulmonary disease)   . CAD (coronary artery disease)     Mild plaque (cath "years ago"); abnormal Myoview 04/2013 with subsequent CABG x 5 with LIMA to LAD, SVG to OM1, SVG to DX, SVG to PD & PL.   Marland Kitchen GERD (gastroesophageal reflux disease)   . Hyperlipidemia   . Hypertension   . Vitamin D deficiency   . Meralgia paresthetica of left side 2011  . LBP (low back pain)   . PVD (peripheral vascular disease)     Stent to left common femoral and right superficial femoral.  2008.  50%  left renal   . Sleep apnea     mod OSA, central sleep apnea/hypoapnea syndrome 11/22/12  . Shortness of breath     "once in awhile; can happen at anytime" (08/26/2013)    Past Surgical History    Procedure Laterality Date  . Lower extremity stents      bilateral lower extremities x 2  . Femoral-popliteal bypass graft  12/27/2012    Procedure: BYPASS GRAFT FEMORAL-POPLITEAL ARTERY;  Surgeon: Serafina Mitchell, MD;  Location: [facility];  Service: Vascular;  Laterality: Right;  using non-reversed sapphenous vein.  . Cardiac catheterization  05/02/13    x2   . Coronary artery bypass graft N/A 05/06/2013    Procedure: CORONARY ARTERY BYPASS GRAFTING (CABG);  Surgeon: Melrose Nakayama, MD;  Location: [place];  Service: Open Heart Surgery;  Laterality: N/A;  Coronary artery bypass graft times five using left internal mammary artery and left greater saphenous vein via endovein harvest.    History  Smoking status  . Former Smoker -- 2.00 packs/day for 47 years  . Types: Cigarettes  . Quit date: 12/17/2012  Smokeless tobacco  . Never Used    History  Alcohol Use  . 2.4 oz/week  . 4 Shots of liquor per week    Comment: 08/26/2013 "3-4 mixed drinks/wk"    Family History  Problem Relation Age of Onset  . Heart disease Mother     No clear CAD  . Hypertension Mother   . Cancer Mother     ? colon ca  . Hyperlipidemia Mother   . Cancer Father     brain tumor  . Alzheimer's disease Father   . Colon cancer Neg Hx     Review of Systems: The review of systems is per the HPI.  All other systems were reviewed and are negative.  Physical Exam: BP 130/78  Pulse 68  Ht 5\' 5"  (1.651 m)  Wt 178 lb 12.8 oz (81.103 kg)  BMI 29.75 kg/m2 Patient is very pleasant and in no acute distress. Skin is warm and dry. Color is normal.  Face is ruddy. HEENT is unremarkable. Normocephalic/atraumatic. PERRL. Sclera are nonicteric. Neck is supple. No masses. No JVD. Lungs are coarse. Cardiac exam shows a regular rate and rhythm. Abdomen is soft. Extremities are without edema. Gait and ROM are intact. No gross neurologic deficits noted.  LABORATORY DATA: Lab Results  Component Value Date   WBC 7.4 08/26/2013    HGB 14.2 08/26/2013   HCT 41.3 08/26/2013   PLT 190 08/26/2013   GLUCOSE 85 08/27/2013   CHOL 152 08/26/2013   TRIG 125 08/26/2013   HDL 53 08/26/2013   LDLDIRECT 159.6 08/28/2012   LDLCALC 74 08/26/2013   ALT 28 08/27/2013   AST 33 08/27/2013   NA 139 08/27/2013  K 3.1* 08/27/2013   CL 104 08/27/2013   CREATININE 0.74 08/27/2013   BUN 11 08/27/2013   CO2 21 08/27/2013   TSH 1.93 08/28/2012   PSA 0.65 08/28/2012   INR 1.00 08/27/2013   HGBA1C 5.8* 05/03/2013     Assessment / Plan: 1. HTN - BP by me is 118/70. Have checked his cuff - it does correlates - he is willing to stay on his current regimen. See Dr. Burt Knack back in 6 month.   2. CAD - s/p CABG - almost 6 months out. Will send to cardiac rehab. This may help get him moving.   3. Fatigue - most likely multifactorial - needs to really work on CV risk factor modification.   4. Substance abuse - always difficult to quantify.   See Dr. Burt Knack back in 6 months.   Patient is agreeable to this plan and will call if any problems develop in the interim.   [name], RN, [facility] [address] [place], [place]  [address]

## 2013-10-08 NOTE — Patient Instructions (Addendum)
Stay on your current medicines  See Dr. Burt Knack in 6 months  I will make sure that a referral to cardiac rehab has been sent  Walk every day!  Call the [place] office at [phone] if you have any questions, problems or concerns.

## 2013-10-09 ENCOUNTER — Ambulatory Visit: Payer: 59 | Admitting: Physical Therapy

## 2013-10-14 ENCOUNTER — Telehealth (HOSPITAL_COMMUNITY): Payer: Self-pay | Admitting: Cardiac Rehabilitation

## 2013-10-14 ENCOUNTER — Encounter: Payer: 59 | Admitting: Physical Therapy

## 2013-10-18 ENCOUNTER — Encounter: Payer: Self-pay | Admitting: Surgery

## 2013-10-21 ENCOUNTER — Ambulatory Visit (HOSPITAL_COMMUNITY)
Admission: RE | Admit: 2013-10-21 | Discharge: 2013-10-21 | Disposition: A | Discharge status: Home / Self Care | Payer: 59 | Source: Ambulatory Visit | Attending: Surgery | Admitting: Surgery

## 2013-10-21 ENCOUNTER — Ambulatory Visit (INDEPENDENT_AMBULATORY_CARE_PROVIDER_SITE_OTHER)
Admission: RE | Admit: 2013-10-21 | Discharge: 2013-10-21 | Disposition: A | Discharge status: Home / Self Care | Payer: 59 | Source: Ambulatory Visit | Attending: Surgery | Admitting: Surgery

## 2013-10-21 ENCOUNTER — Ambulatory Visit (INDEPENDENT_AMBULATORY_CARE_PROVIDER_SITE_OTHER): Payer: 59 | Admitting: Surgery

## 2013-10-21 ENCOUNTER — Encounter: Payer: Self-pay | Admitting: Surgery

## 2013-10-21 VITALS — BP 136/65 | HR 87 | Ht 65.0 in | Wt 174.0 lb

## 2013-10-21 DIAGNOSIS — Z48812 Encounter for surgical aftercare following surgery on the circulatory system: Secondary | ICD-10-CM

## 2013-10-21 DIAGNOSIS — I739 Peripheral vascular disease, unspecified: Secondary | ICD-10-CM
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
## 2013-10-23 NOTE — Addendum Note (Signed)
Addended by: Thresa Ross C on: 10/23/2013 09:00 AM   Modules accepted: Orders

## 2013-11-07 ENCOUNTER — Encounter (HOSPITAL_COMMUNITY)
Admission: RE | Admit: 2013-11-07 | Discharge: 2013-11-07 | Disposition: A | Discharge status: Home / Self Care | Payer: 59 | Source: Ambulatory Visit | Attending: Cardiovascular Disease | Admitting: Cardiovascular Disease

## 2013-11-07 DIAGNOSIS — Z951 Presence of aortocoronary bypass graft: Secondary | ICD-10-CM | POA: Insufficient documentation

## 2013-11-07 DIAGNOSIS — Z87891 Personal history of nicotine dependence: Secondary | ICD-10-CM | POA: Insufficient documentation

## 2013-11-07 DIAGNOSIS — I498 Other specified cardiac arrhythmias: Secondary | ICD-10-CM | POA: Insufficient documentation

## 2013-11-07 DIAGNOSIS — I739 Peripheral vascular disease, unspecified: Secondary | ICD-10-CM | POA: Insufficient documentation

## 2013-11-07 DIAGNOSIS — Z7982 Long term (current) use of aspirin: Secondary | ICD-10-CM | POA: Insufficient documentation

## 2013-11-07 DIAGNOSIS — Z79899 Other long term (current) drug therapy: Secondary | ICD-10-CM | POA: Insufficient documentation

## 2013-11-07 DIAGNOSIS — I2510 Atherosclerotic heart disease of native coronary artery without angina pectoris: Secondary | ICD-10-CM | POA: Insufficient documentation

## 2013-11-07 DIAGNOSIS — I10 Essential (primary) hypertension: Secondary | ICD-10-CM | POA: Insufficient documentation

## 2013-11-07 DIAGNOSIS — I70219 Atherosclerosis of native arteries of extremities with intermittent claudication, unspecified extremity: Secondary | ICD-10-CM | POA: Insufficient documentation

## 2013-11-07 DIAGNOSIS — Z5189 Encounter for other specified aftercare: Secondary | ICD-10-CM | POA: Insufficient documentation

## 2013-11-07 DIAGNOSIS — J449 Chronic obstructive pulmonary disease, unspecified: Secondary | ICD-10-CM | POA: Insufficient documentation

## 2013-11-07 DIAGNOSIS — G4733 Obstructive sleep apnea (adult) (pediatric): Secondary | ICD-10-CM | POA: Insufficient documentation

## 2013-11-07 DIAGNOSIS — J4489 Other specified chronic obstructive pulmonary disease: Secondary | ICD-10-CM | POA: Insufficient documentation

## 2013-11-07 NOTE — Progress Notes (Signed)
Cardiac Rehab Medication Review by a Pharmacist  Does the patient  feel that his/her medications are working for him/her?  yes  Has the patient been experiencing any side effects to the medications prescribed?  no  Does the patient measure his/her own blood pressure or blood glucose at home?  yes   Does the patient have any problems obtaining medications due to transportation or finances?   no  Understanding of regimen: excellent Understanding of indications: good Potential of compliance: good    Pharmacist comments: Spoke with Mr. Goelz regarding his medications.  He understands his regimen and takes his medications appropriately.  He states he occasionally misses the one that is three times daily (Hydralazine) but has had problems with BP control and feels this regimen is working for him.  He understands the importance of medication compliance.  Rober Minion, PharmD., MS Clinical Pharmacist Pager:  [phone] Thank you for allowing pharmacy to be part of this patients care team.   11/07/2013 8:16 AM    Subjective:    Patient ID: [name], male    DOB: 11/25/1951, 62 y.o.   MRN: [id number]  HPI    Review of Systems     Objective:   Physical Exam

## 2013-11-11 ENCOUNTER — Telehealth: Payer: Self-pay | Admitting: Nurse Practitioner

## 2013-11-11 ENCOUNTER — Other Ambulatory Visit (INDEPENDENT_AMBULATORY_CARE_PROVIDER_SITE_OTHER): Payer: 59

## 2013-11-11 ENCOUNTER — Encounter (HOSPITAL_COMMUNITY)
Admission: RE | Admit: 2013-11-11 | Discharge: 2013-11-11 | Disposition: A | Discharge status: Home / Self Care | Payer: 59 | Source: Ambulatory Visit | Attending: Cardiovascular Disease | Admitting: Cardiovascular Disease

## 2013-11-11 ENCOUNTER — Other Ambulatory Visit: Payer: Self-pay | Admitting: Nurse Practitioner

## 2013-11-11 DIAGNOSIS — I10 Essential (primary) hypertension: Secondary | ICD-10-CM

## 2013-11-11 LAB — BASIC METABOLIC PANEL
BUN: 13 mg/dL (ref 6–23)
CO2: 25 mEq/L (ref 19–32)
Calcium: 8.9 mg/dL (ref 8.4–10.5)
Chloride: 105 mEq/L (ref 96–112)
Creatinine, Ser: 1 mg/dL (ref 0.4–1.5)
GFR: 81.23 mL/min (ref >60.00)
Glucose, Bld: 86 mg/dL (ref 70–99)
Sodium: 138 mEq/L (ref 135–145)

## 2013-11-11 NOTE — Telephone Encounter (Signed)
Received call from Verdis Frederickson, [place] in Cardiac Rehab that patient is scheduled to begin exercising today but she is concerned that patient's K+ could be low as patient is on diuretic and last labs on 9/30 show that potassium level was 3.1.  Verdis Frederickson would like to know if she should withhold exercise until Wednesday and have BMET checked.  I spoke with Dr. Burt Knack via telephone and he advised patient be allowed to exercise and then come to office for labs.  I verbalized Dr. Antionette Char advice to [facility] who verbalized understanding. Order for bmet in epic

## 2013-11-11 NOTE — Progress Notes (Deleted)
Reviewed home exercise with pt today.  Pt plans to walk at home and to return to [facility] for exercise.  Reviewed THR, pulse, RPE, sign and symptoms, and when to call 911 or MD.  Pt voiced understanding. Alberteen Sam, MA, ACSM RCEP

## 2013-11-11 NOTE — Progress Notes (Signed)
Pt started cardiac rehab today.  Pt tolerated light exercise without difficulty. Telemetry rhythm Sinus with a first degree heart block. Vital signs stable. Will continue to monitor the patient throughout  the program. Douglas Christensen  instructed to go Dr Antionette Char office after exercise for blood draw per Dr Burt Knack.

## 2013-11-13 ENCOUNTER — Encounter (HOSPITAL_COMMUNITY)
Admission: RE | Admit: 2013-11-13 | Discharge: 2013-11-13 | Disposition: A | Discharge status: Home / Self Care | Payer: 59 | Source: Ambulatory Visit | Attending: Cardiovascular Disease | Admitting: Cardiovascular Disease

## 2013-11-13 ENCOUNTER — Telehealth: Payer: Self-pay

## 2013-11-13 DIAGNOSIS — E876 Hypokalemia: Secondary | ICD-10-CM

## 2013-11-13 MED ORDER — POTASSIUM CHLORIDE CRYS ER 20 MEQ PO TBCR: 20.0000 meq | EXTENDED_RELEASE_TABLET | Freq: Every day | ORAL | Status: DC

## 2013-11-13 NOTE — Telephone Encounter (Signed)
I spoke with the pt and made him aware of BMP results. The pt will start potassium supplement and stop OTC potassium.  BMP will be rechecked on 12/16/2013. Rx sent to pharmacy.

## 2013-11-15 ENCOUNTER — Telehealth: Payer: Self-pay | Admitting: Cardiovascular Disease

## 2013-11-15 ENCOUNTER — Encounter (HOSPITAL_COMMUNITY)
Admission: RE | Admit: 2013-11-15 | Discharge: 2013-11-15 | Disposition: A | Discharge status: Home / Self Care | Payer: 59 | Source: Ambulatory Visit | Attending: Cardiovascular Disease | Admitting: Cardiovascular Disease

## 2013-11-15 NOTE — Telephone Encounter (Signed)
New problem    Pt needs a note for work saying pt has 11 more weeks of rehab and by they pt should be able to return to work.  Pt will pick it up please call when ready this is to hold his position at work.

## 2013-11-15 NOTE — Telephone Encounter (Signed)
The pt's last scheduled appointment for cardiac rehab in [facility] is 02/14/14.  Letter completed and the pt will pick up at the front desk.

## 2013-11-18 ENCOUNTER — Encounter (HOSPITAL_COMMUNITY)
Admission: RE | Admit: 2013-11-18 | Discharge: 2013-11-18 | Disposition: A | Discharge status: Home / Self Care | Payer: 59 | Source: Ambulatory Visit | Attending: Cardiovascular Disease | Admitting: Cardiovascular Disease

## 2013-11-20 ENCOUNTER — Encounter (HOSPITAL_COMMUNITY)
Admission: RE | Admit: 2013-11-20 | Discharge: 2013-11-20 | Disposition: A | Discharge status: Home / Self Care | Payer: 59 | Source: Ambulatory Visit | Attending: Cardiovascular Disease | Admitting: Cardiovascular Disease

## 2013-11-22 ENCOUNTER — Encounter (HOSPITAL_COMMUNITY): Payer: 59

## 2013-11-25 ENCOUNTER — Encounter (HOSPITAL_COMMUNITY): Payer: 59

## 2013-11-26 ENCOUNTER — Ambulatory Visit (INDEPENDENT_AMBULATORY_CARE_PROVIDER_SITE_OTHER): Payer: 59 | Admitting: Internal Medicine

## 2013-11-26 ENCOUNTER — Encounter: Payer: Self-pay | Admitting: Internal Medicine

## 2013-11-26 VITALS — BP 150/70 | HR 72 | Temp 97.0°F | Resp 16 | Wt 175.0 lb

## 2013-11-26 DIAGNOSIS — I2510 Atherosclerotic heart disease of native coronary artery without angina pectoris: Secondary | ICD-10-CM

## 2013-11-26 DIAGNOSIS — E876 Hypokalemia: Secondary | ICD-10-CM

## 2013-11-26 DIAGNOSIS — J209 Acute bronchitis, unspecified: Secondary | ICD-10-CM

## 2013-11-26 MED ORDER — METHYLPREDNISOLONE ACETATE 80 MG/ML IJ SUSP
80.0000 mg | Freq: Once | INTRAMUSCULAR | Status: AC
  Administered 2013-11-26: 80 mg via INTRAMUSCULAR

## 2013-11-26 MED ORDER — HYDRALAZINE HCL 50 MG PO TABS: 50.0000 mg | ORAL_TABLET | Freq: Three times a day (TID) | ORAL | Status: DC

## 2013-11-26 MED ORDER — ATORVASTATIN CALCIUM 20 MG PO TABS: ORAL_TABLET | ORAL | Status: DC

## 2013-11-26 MED ORDER — POTASSIUM CHLORIDE CRYS ER 20 MEQ PO TBCR: 20.0000 meq | EXTENDED_RELEASE_TABLET | Freq: Every day | ORAL | Status: DC

## 2013-11-26 MED ORDER — AZITHROMYCIN 250 MG PO TABS: ORAL_TABLET | ORAL | Status: DC

## 2013-11-26 MED ORDER — ATENOLOL 50 MG PO TABS: 50.0000 mg | ORAL_TABLET | Freq: Every day | ORAL | Status: DC

## 2013-11-26 MED ORDER — AMLODIPINE BESYLATE 10 MG PO TABS: 5.0000 mg | ORAL_TABLET | Freq: Every day | ORAL | Status: DC

## 2013-11-26 NOTE — Assessment & Plan Note (Signed)
Continue with current prescription therapy as reflected on the Med list.  

## 2013-11-26 NOTE — Progress Notes (Signed)
Pre visit review using our clinic review tool, if applicable. No additional management support is needed unless otherwise documented below in the visit note. 
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
## 2013-11-26 NOTE — Assessment & Plan Note (Signed)
Zpac Depomedrol IM 80 mg

## 2013-11-26 NOTE — Patient Instructions (Signed)
Use over-the-counter  "cold" medicines  such as  "Afrin" nasal spray for nasal congestion as directed instead. Use" Delsym" or" Robitussin" cough syrup varietis for cough.  You can use plain "Tylenol" or "Advil" for fever, chills and achyness.  Please, make an appointment if you are not better or if you're worse.  

## 2013-11-27 ENCOUNTER — Encounter (HOSPITAL_COMMUNITY): Payer: 59

## 2013-11-29 ENCOUNTER — Encounter (HOSPITAL_COMMUNITY): Payer: 59

## 2013-12-02 ENCOUNTER — Encounter (HOSPITAL_COMMUNITY)
Admission: RE | Admit: 2013-12-02 | Discharge: 2013-12-02 | Disposition: A | Discharge status: Home / Self Care | Payer: 59 | Source: Ambulatory Visit | Attending: Cardiovascular Disease | Admitting: Cardiovascular Disease

## 2013-12-02 DIAGNOSIS — Z79899 Other long term (current) drug therapy: Secondary | ICD-10-CM | POA: Insufficient documentation

## 2013-12-02 DIAGNOSIS — Z87891 Personal history of nicotine dependence: Secondary | ICD-10-CM | POA: Insufficient documentation

## 2013-12-02 DIAGNOSIS — I498 Other specified cardiac arrhythmias: Secondary | ICD-10-CM | POA: Insufficient documentation

## 2013-12-02 DIAGNOSIS — J449 Chronic obstructive pulmonary disease, unspecified: Secondary | ICD-10-CM | POA: Insufficient documentation

## 2013-12-02 DIAGNOSIS — Z7982 Long term (current) use of aspirin: Secondary | ICD-10-CM | POA: Insufficient documentation

## 2013-12-02 DIAGNOSIS — Z951 Presence of aortocoronary bypass graft: Secondary | ICD-10-CM | POA: Insufficient documentation

## 2013-12-02 DIAGNOSIS — I70219 Atherosclerosis of native arteries of extremities with intermittent claudication, unspecified extremity: Secondary | ICD-10-CM | POA: Insufficient documentation

## 2013-12-02 DIAGNOSIS — I739 Peripheral vascular disease, unspecified: Secondary | ICD-10-CM | POA: Insufficient documentation

## 2013-12-02 DIAGNOSIS — I2510 Atherosclerotic heart disease of native coronary artery without angina pectoris: Secondary | ICD-10-CM | POA: Insufficient documentation

## 2013-12-02 DIAGNOSIS — Z5189 Encounter for other specified aftercare: Secondary | ICD-10-CM | POA: Insufficient documentation

## 2013-12-02 DIAGNOSIS — J4489 Other specified chronic obstructive pulmonary disease: Secondary | ICD-10-CM | POA: Insufficient documentation

## 2013-12-02 DIAGNOSIS — I10 Essential (primary) hypertension: Secondary | ICD-10-CM | POA: Insufficient documentation

## 2013-12-02 DIAGNOSIS — G4733 Obstructive sleep apnea (adult) (pediatric): Secondary | ICD-10-CM | POA: Insufficient documentation

## 2013-12-02 NOTE — Progress Notes (Signed)
Reviewed home exercise with pt today.  Pt plans to walk at home and go to gym at work for exercise.  Reviewed THR, pulse, RPE, sign and symptoms, and when to call 911 or MD.  Pt voiced understanding. Alberteen Sam, MA, ACSM RCEP

## 2013-12-04 ENCOUNTER — Encounter (HOSPITAL_COMMUNITY)
Admission: RE | Admit: 2013-12-04 | Discharge: 2013-12-04 | Disposition: A | Discharge status: Home / Self Care | Payer: 59 | Source: Ambulatory Visit | Attending: Cardiovascular Disease | Admitting: Cardiovascular Disease

## 2013-12-06 ENCOUNTER — Encounter (HOSPITAL_COMMUNITY)
Admission: RE | Admit: 2013-12-06 | Discharge: 2013-12-06 | Disposition: A | Discharge status: Home / Self Care | Payer: 59 | Source: Ambulatory Visit | Attending: Cardiovascular Disease | Admitting: Cardiovascular Disease

## 2013-12-09 ENCOUNTER — Encounter (HOSPITAL_COMMUNITY)
Admission: RE | Admit: 2013-12-09 | Discharge: 2013-12-09 | Disposition: A | Discharge status: Home / Self Care | Payer: 59 | Source: Ambulatory Visit | Attending: Cardiovascular Disease | Admitting: Cardiovascular Disease

## 2013-12-10 ENCOUNTER — Telehealth: Payer: Self-pay | Admitting: Cardiovascular Disease

## 2013-12-10 NOTE — Telephone Encounter (Signed)
Walk In pt form " [place] [employer] Evaluation Form" Dropped Off gave to [employer]

## 2013-12-11 ENCOUNTER — Encounter (HOSPITAL_COMMUNITY)
Admission: RE | Admit: 2013-12-11 | Discharge: 2013-12-11 | Disposition: A | Discharge status: Home / Self Care | Payer: 59 | Source: Ambulatory Visit | Attending: Cardiovascular Disease | Admitting: Cardiovascular Disease

## 2013-12-13 ENCOUNTER — Encounter (HOSPITAL_COMMUNITY): Payer: 59

## 2013-12-13 DIAGNOSIS — Z0279 Encounter for issue of other medical certificate: Secondary | ICD-10-CM

## 2013-12-16 ENCOUNTER — Encounter (HOSPITAL_COMMUNITY)
Admission: RE | Admit: 2013-12-16 | Discharge: 2013-12-16 | Disposition: A | Discharge status: Home / Self Care | Payer: 59 | Source: Ambulatory Visit | Attending: Cardiovascular Disease | Admitting: Cardiovascular Disease

## 2013-12-16 ENCOUNTER — Other Ambulatory Visit (INDEPENDENT_AMBULATORY_CARE_PROVIDER_SITE_OTHER): Payer: 59

## 2013-12-16 DIAGNOSIS — E876 Hypokalemia: Secondary | ICD-10-CM

## 2013-12-16 LAB — BASIC METABOLIC PANEL
BUN: 16 mg/dL (ref 6–23)
CALCIUM: 9 mg/dL (ref 8.4–10.5)
CO2: 23 mEq/L (ref 19–32)
Chloride: 107 mEq/L (ref 96–112)
Creatinine, Ser: 0.9 mg/dL (ref 0.4–1.5)
GFR: 93.02 mL/min (ref >60.00)
Glucose, Bld: 98 mg/dL (ref 70–99)
POTASSIUM: 3.7 meq/L (ref 3.5–5.1)
Sodium: 139 mEq/L (ref 135–145)

## 2013-12-16 NOTE — Progress Notes (Signed)
Douglas Christensen 62 y.o. male Nutrition Note Spoke with pt.  Nutrition Plan and Nutrition Survey goals reviewed with pt. Pt is following Step 1 of the Therapeutic Lifestyle Changes diet. Pt wants to lose wt. Pt has not been actively trying to lose wt. Wt loss tips reviewed. Pt expressed understanding of the information reviewed. Pt aware of nutrition education classes offered.  Nutrition Diagnosis   Food-and nutrition-related knowledge deficit related to lack of exposure to information as related to diagnosis of: ? CVD ? Pre-DM (A1c 5.8) ?   Obesity related to excessive energy intake as evidenced by a BMI of 30.2  Nutrition RX/ Estimated Daily Nutrition Needs for: wt loss  1300-1800 Kcal, 30-45 gm fat, 8-12 gm sat fat, 1.2-1.8 gm trans-fat, <1500 mg sodium   Nutrition Intervention   Pt's individual nutrition plan including cholesterol goals reviewed with pt.   Benefits of adopting Therapeutic Lifestyle Changes discussed when Medficts reviewed.   Pt to attend the Portion Distortion class   Pt to attend the  ? Nutrition I class                     ? Nutrition II class   Pt given handouts for: ? Nutrition I class ? Nutrition II class    Continue client-centered nutrition education by RD, as part of interdisciplinary care.  Goal(s)   Pt to identify and limit food sources of saturated fat, trans fat, and cholesterol   Pt to identify food quantities necessary to achieve: ? wt loss to a goal wt of 151-169 lb (68.8-77 kg) at graduation from cardiac rehab.   Monitor and Evaluate progress toward nutrition goal with team. Nutrition Risk:  Low   Derek Mound, M.Ed, RD, LDN, CDE 12/16/2013 11:25 AM

## 2013-12-18 ENCOUNTER — Encounter (HOSPITAL_COMMUNITY)
Admission: RE | Admit: 2013-12-18 | Discharge: 2013-12-18 | Disposition: A | Discharge status: Home / Self Care | Payer: 59 | Source: Ambulatory Visit | Attending: Cardiovascular Disease | Admitting: Cardiovascular Disease

## 2013-12-20 ENCOUNTER — Encounter (HOSPITAL_COMMUNITY)
Admission: RE | Admit: 2013-12-20 | Discharge: 2013-12-20 | Disposition: A | Discharge status: Home / Self Care | Payer: 59 | Source: Ambulatory Visit | Attending: Cardiovascular Disease | Admitting: Cardiovascular Disease

## 2013-12-23 ENCOUNTER — Encounter (HOSPITAL_COMMUNITY)
Admission: RE | Admit: 2013-12-23 | Discharge: 2013-12-23 | Disposition: A | Discharge status: Home / Self Care | Payer: 59 | Source: Ambulatory Visit | Attending: Cardiovascular Disease | Admitting: Cardiovascular Disease

## 2013-12-25 ENCOUNTER — Encounter (HOSPITAL_COMMUNITY)
Admission: RE | Admit: 2013-12-25 | Discharge: 2013-12-25 | Disposition: A | Discharge status: Home / Self Care | Payer: 59 | Source: Ambulatory Visit | Attending: Cardiovascular Disease | Admitting: Cardiovascular Disease

## 2013-12-26 ENCOUNTER — Other Ambulatory Visit: Payer: Self-pay | Admitting: Internal Medicine

## 2013-12-27 ENCOUNTER — Encounter (HOSPITAL_COMMUNITY)
Admission: RE | Admit: 2013-12-27 | Discharge: 2013-12-27 | Disposition: A | Discharge status: Home / Self Care | Payer: 59 | Source: Ambulatory Visit | Attending: Cardiovascular Disease | Admitting: Cardiovascular Disease

## 2013-12-30 ENCOUNTER — Telehealth: Payer: Self-pay | Admitting: Cardiology

## 2013-12-30 ENCOUNTER — Encounter (HOSPITAL_COMMUNITY)
Admission: RE | Admit: 2013-12-30 | Discharge: 2013-12-30 | Disposition: A | Discharge status: Home / Self Care | Payer: 59 | Source: Ambulatory Visit | Attending: Cardiovascular Disease | Admitting: Cardiovascular Disease

## 2013-12-30 ENCOUNTER — Encounter: Payer: Self-pay | Admitting: Internal Medicine

## 2013-12-30 ENCOUNTER — Ambulatory Visit (INDEPENDENT_AMBULATORY_CARE_PROVIDER_SITE_OTHER): Payer: 59 | Admitting: Internal Medicine

## 2013-12-30 VITALS — BP 138/70 | HR 76 | Temp 98.0°F | Resp 16 | Wt 174.0 lb

## 2013-12-30 DIAGNOSIS — Z87891 Personal history of nicotine dependence: Secondary | ICD-10-CM | POA: Insufficient documentation

## 2013-12-30 DIAGNOSIS — G4733 Obstructive sleep apnea (adult) (pediatric): Secondary | ICD-10-CM | POA: Insufficient documentation

## 2013-12-30 DIAGNOSIS — I498 Other specified cardiac arrhythmias: Secondary | ICD-10-CM | POA: Insufficient documentation

## 2013-12-30 DIAGNOSIS — I10 Essential (primary) hypertension: Secondary | ICD-10-CM | POA: Insufficient documentation

## 2013-12-30 DIAGNOSIS — Z79899 Other long term (current) drug therapy: Secondary | ICD-10-CM | POA: Insufficient documentation

## 2013-12-30 DIAGNOSIS — I2510 Atherosclerotic heart disease of native coronary artery without angina pectoris: Secondary | ICD-10-CM | POA: Insufficient documentation

## 2013-12-30 DIAGNOSIS — Z5189 Encounter for other specified aftercare: Secondary | ICD-10-CM | POA: Insufficient documentation

## 2013-12-30 DIAGNOSIS — J449 Chronic obstructive pulmonary disease, unspecified: Secondary | ICD-10-CM | POA: Insufficient documentation

## 2013-12-30 DIAGNOSIS — Z7982 Long term (current) use of aspirin: Secondary | ICD-10-CM | POA: Insufficient documentation

## 2013-12-30 DIAGNOSIS — F411 Generalized anxiety disorder: Secondary | ICD-10-CM

## 2013-12-30 DIAGNOSIS — R42 Dizziness and giddiness: Secondary | ICD-10-CM

## 2013-12-30 DIAGNOSIS — I70219 Atherosclerosis of native arteries of extremities with intermittent claudication, unspecified extremity: Secondary | ICD-10-CM | POA: Insufficient documentation

## 2013-12-30 DIAGNOSIS — Z951 Presence of aortocoronary bypass graft: Secondary | ICD-10-CM | POA: Insufficient documentation

## 2013-12-30 DIAGNOSIS — I739 Peripheral vascular disease, unspecified: Secondary | ICD-10-CM | POA: Insufficient documentation

## 2013-12-30 DIAGNOSIS — F419 Anxiety disorder, unspecified: Secondary | ICD-10-CM | POA: Insufficient documentation

## 2013-12-30 DIAGNOSIS — J4489 Other specified chronic obstructive pulmonary disease: Secondary | ICD-10-CM | POA: Insufficient documentation

## 2013-12-30 MED ORDER — CLONAZEPAM 0.25 MG PO TBDP: 0.2500 mg | ORAL_TABLET | Freq: Two times a day (BID) | ORAL | Status: DC | PRN

## 2013-12-30 NOTE — Progress Notes (Signed)
Pre visit review using our clinic review tool, if applicable. No additional management support is needed unless otherwise documented below in the visit note. 

## 2013-12-30 NOTE — Assessment & Plan Note (Signed)
2/15 ?etiol - Rx related Eat before meds Take these meds at hs: Atenolol, lipitor, Norvasc

## 2013-12-30 NOTE — Progress Notes (Signed)
Patient reports feeling dizzy after driving to cardiac rehab this morning. Mr Douglas Christensen [place]. Telemetry rhythm Sinus with occasional PAC.  Blood pressure 120/70 sitting 120/60 sitting. Heart rate 66. Oxygen saturation 98% on room air.  Cecilie Kicks FNP-c called and notifed. Advised that patient follow up with his primary care physician. Dr Plotnikov's office called. Appointment made for Mr Douglas Christensen to follow up with his primary care physician this afternoon at 2;45.  Patient signed medical release form and given his exercise flow sheets for Dr Alain Marion for review. Douglas Christensen is having a friend to pick him up from exercise today but reports feeling better upon exit from cardiac rehab.

## 2013-12-30 NOTE — Progress Notes (Signed)
Subjective:   C/o dizziness, lightheadedness x 2 wks  F/u Coronary artery disease with history of CABG, patient remained stable, cardiac enzymes x3 were less than 0.3. Patient had no further episodes of chest heaviness.        HPIHis CP, neck pain and LBP are better F/u LBP: he saw Dr Annette Stable and had a CT myelogram a few months ago - no need for surgery (2013)   The patient presents for a follow-up of  Chronic PVD hypertension, chronic dyslipidemia, CAD. He is not smoking...  S/p R bypass 2014, s/p CABG - doing better  BP Readings from Last 3 Encounters:  12/30/13 138/70  11/26/13 150/70  11/07/13 122/56    Wt Readings from Last 3 Encounters:  12/30/13 174 lb (78.926 kg)  11/26/13 175 lb (79.379 kg)  11/07/13 175 lb 11.3 oz (79.7 kg)      Review of Systems  Constitutional: Negative for appetite change and unexpected weight change.  HENT: Negative for nosebleeds and trouble swallowing.   Eyes: Negative for itching and visual disturbance.  Respiratory: Positive for shortness of breath. Negative for chest tightness.   Cardiovascular: Negative for palpitations and leg swelling.  Gastrointestinal: Negative for blood in stool and abdominal distention.  Genitourinary: Negative for frequency and hematuria.  Musculoskeletal: Positive for back pain and gait problem.  Skin: Negative for color change.  Neurological: Negative for dizziness, tremors and speech difficulty.  Psychiatric/Behavioral: Negative for suicidal ideas, confusion, sleep disturbance, dysphoric mood and agitation. The patient is not nervous/anxious.        Objective:   Physical Exam  Constitutional: He is oriented to person, place, and time. He appears well-developed. No distress.  HENT:  Mouth/Throat: Oropharynx is clear and moist.  Eyes: Conjunctivae are normal. Pupils are equal, round, and reactive to light.  Neck: Normal range of motion. No JVD present. No thyromegaly present.  Cardiovascular: Normal  rate, regular rhythm, normal heart sounds and intact distal pulses.  Exam reveals no gallop and no friction rub.   No murmur heard. Pulmonary/Chest: Effort normal and breath sounds normal. No respiratory distress. He has no wheezes. He has no rales. He exhibits no tenderness.  Abdominal: Soft. Bowel sounds are normal. He exhibits no distension and no mass. There is no tenderness. There is no rebound and no guarding.  Musculoskeletal: Normal range of motion. He exhibits tenderness (R buttock, R hip, R leg, foot). He exhibits no edema.  Lymphadenopathy:    He has no cervical adenopathy.  Neurological: He is alert and oriented to person, place, and time. He has normal reflexes. No cranial nerve deficit. He exhibits normal muscle tone. Coordination normal.  Skin: Skin is warm and dry. No rash noted.  Very tanned  Psychiatric: He has a normal mood and affect. His behavior is normal. Judgment and thought content normal.  chest and R groin scar  Lab Results  Component Value Date   WBC 7.4 08/26/2013   HGB 14.2 08/26/2013   HCT 41.3 08/26/2013   PLT 190 08/26/2013   GLUCOSE 98 12/16/2013   CHOL 152 08/26/2013   TRIG 125 08/26/2013   HDL 53 08/26/2013   LDLDIRECT 159.6 08/28/2012   LDLCALC 74 08/26/2013   ALT 28 08/27/2013   AST 33 08/27/2013   NA 139 12/16/2013   K 3.7 12/16/2013   CL 107 12/16/2013   CREATININE 0.9 12/16/2013   BUN 16 12/16/2013   CO2 23 12/16/2013   TSH 1.93 08/28/2012   PSA 0.65 08/28/2012  INR 1.00 08/27/2013   HGBA1C 5.8* 05/03/2013          Assessment & Plan:

## 2013-12-30 NOTE — Telephone Encounter (Signed)
Called by cardiac rehab for pt complaining of dizziness.  No orthostatic changes in BP.  120/60 at both.  SR on monitor.  I advised them to call PCP for appt.  This has been going on for several days, could be viral.

## 2014-01-01 ENCOUNTER — Encounter (HOSPITAL_COMMUNITY)
Admission: RE | Admit: 2014-01-01 | Discharge: 2014-01-01 | Disposition: A | Discharge status: Home / Self Care | Payer: 59 | Source: Ambulatory Visit | Attending: Cardiovascular Disease | Admitting: Cardiovascular Disease

## 2014-01-01 NOTE — Progress Notes (Signed)
Atley returned to exercise and had no complaints today. Will continue to monitor the patient throughout  the program.

## 2014-01-03 ENCOUNTER — Encounter (HOSPITAL_COMMUNITY)
Admission: RE | Admit: 2014-01-03 | Discharge: 2014-01-03 | Disposition: A | Discharge status: Home / Self Care | Payer: 59 | Source: Ambulatory Visit | Attending: Cardiovascular Disease | Admitting: Cardiovascular Disease

## 2014-01-06 ENCOUNTER — Encounter (HOSPITAL_COMMUNITY)
Admission: RE | Admit: 2014-01-06 | Discharge: 2014-01-06 | Disposition: A | Discharge status: Home / Self Care | Payer: 59 | Source: Ambulatory Visit | Attending: Cardiovascular Disease | Admitting: Cardiovascular Disease

## 2014-01-07 ENCOUNTER — Encounter (HOSPITAL_COMMUNITY): Payer: Self-pay | Admitting: Emergency Medicine

## 2014-01-07 ENCOUNTER — Emergency Department (HOSPITAL_COMMUNITY)
Admission: EM | Admit: 2014-01-07 | Discharge: 2014-01-07 | Disposition: A | Discharge status: Home / Self Care | Payer: 59 | Attending: Emergency Medicine | Admitting: Emergency Medicine

## 2014-01-07 DIAGNOSIS — Z951 Presence of aortocoronary bypass graft: Secondary | ICD-10-CM | POA: Insufficient documentation

## 2014-01-07 DIAGNOSIS — E559 Vitamin D deficiency, unspecified: Secondary | ICD-10-CM | POA: Insufficient documentation

## 2014-01-07 DIAGNOSIS — Z860100 Personal history of colon polyps, unspecified: Secondary | ICD-10-CM | POA: Insufficient documentation

## 2014-01-07 DIAGNOSIS — Z79899 Other long term (current) drug therapy: Secondary | ICD-10-CM | POA: Insufficient documentation

## 2014-01-07 DIAGNOSIS — Z7982 Long term (current) use of aspirin: Secondary | ICD-10-CM | POA: Insufficient documentation

## 2014-01-07 DIAGNOSIS — Z87891 Personal history of nicotine dependence: Secondary | ICD-10-CM | POA: Insufficient documentation

## 2014-01-07 DIAGNOSIS — Z8669 Personal history of other diseases of the nervous system and sense organs: Secondary | ICD-10-CM | POA: Insufficient documentation

## 2014-01-07 DIAGNOSIS — E785 Hyperlipidemia, unspecified: Secondary | ICD-10-CM | POA: Insufficient documentation

## 2014-01-07 DIAGNOSIS — Z8719 Personal history of other diseases of the digestive system: Secondary | ICD-10-CM | POA: Insufficient documentation

## 2014-01-07 DIAGNOSIS — I10 Essential (primary) hypertension: Secondary | ICD-10-CM

## 2014-01-07 DIAGNOSIS — R5381 Other malaise: Secondary | ICD-10-CM | POA: Insufficient documentation

## 2014-01-07 DIAGNOSIS — I2510 Atherosclerotic heart disease of native coronary artery without angina pectoris: Secondary | ICD-10-CM | POA: Insufficient documentation

## 2014-01-07 DIAGNOSIS — J441 Chronic obstructive pulmonary disease with (acute) exacerbation: Secondary | ICD-10-CM | POA: Insufficient documentation

## 2014-01-07 DIAGNOSIS — Z8739 Personal history of other diseases of the musculoskeletal system and connective tissue: Secondary | ICD-10-CM | POA: Insufficient documentation

## 2014-01-07 DIAGNOSIS — R5383 Other fatigue: Secondary | ICD-10-CM

## 2014-01-07 DIAGNOSIS — R110 Nausea: Secondary | ICD-10-CM | POA: Insufficient documentation

## 2014-01-07 LAB — CBC WITH DIFFERENTIAL/PLATELET
BASOS ABS: 0 10*3/uL (ref 0.0–0.1)
Basophils Relative: 0 % (ref 0–1)
Eosinophils Absolute: 0.1 10*3/uL (ref 0.0–0.7)
Eosinophils Relative: 1 % (ref 0–5)
HCT: 44.1 % (ref 39.0–52.0)
Hemoglobin: 15.9 g/dL (ref 13.0–17.0)
Lymphocytes Relative: 16 % (ref 12–46)
Lymphs Abs: 1.5 10*3/uL (ref 0.7–4.0)
MCH: 34.3 pg — ABNORMAL HIGH (ref 26.0–34.0)
MCHC: 36.1 g/dL — AB (ref 30.0–36.0)
MCV: 95 fL (ref 78.0–100.0)
Monocytes Absolute: 0.7 10*3/uL (ref 0.1–1.0)
Monocytes Relative: 8 % (ref 3–12)
NEUTROS ABS: 6.7 10*3/uL (ref 1.7–7.7)
NEUTROS PCT: 75 % (ref 43–77)
Platelets: 211 10*3/uL (ref 150–400)
RBC: 4.64 MIL/uL (ref 4.22–5.81)
RDW: 13.6 % (ref 11.5–15.5)
WBC: 8.9 10*3/uL (ref 4.0–10.5)

## 2014-01-07 LAB — POCT I-STAT TROPONIN I: Troponin i, poc: 0.01 ng/mL (ref 0.00–0.08)

## 2014-01-07 LAB — BASIC METABOLIC PANEL
BUN: 13 mg/dL (ref 6–23)
CHLORIDE: 105 meq/L (ref 96–112)
CO2: 23 mEq/L (ref 19–32)
CREATININE: 0.8 mg/dL (ref 0.50–1.35)
Calcium: 9.2 mg/dL (ref 8.4–10.5)
Glucose, Bld: 106 mg/dL — ABNORMAL HIGH (ref 70–99)
Potassium: 4.4 mEq/L (ref 3.7–5.3)
Sodium: 142 mEq/L (ref 137–147)

## 2014-01-07 MED ORDER — HYDRALAZINE HCL 50 MG PO TABS
50.0000 mg | ORAL_TABLET | Freq: Once | ORAL | Status: AC
  Administered 2014-01-07: 50 mg via ORAL
  Filled 2014-01-07: qty 1

## 2014-01-07 NOTE — ED Provider Notes (Signed)
CSN: [id number]     Arrival date & time 01/07/14  1413 History   First MD Initiated Contact with Patient 01/07/14 1413     Chief Complaint  Patient presents with  . Hypertension    HPI Patient presents to the emergency room with complaints of hypertension. Patient states his at home feeling that his blood pressure was elevated. Patient states he can generally tell when his blood pressure is elevated. He started to have trouble with generalized weakness some nausea as well as shortness of breath.  Patient also started to feel anxious and felt like his vision was blurred. He called 911 after checking his blood pressure at home and noted to be 214/121.  Patient states EMS arrived  his blood pressure was decreased to about 180/100.  Patient states the symptoms have mostly resolved at this point. He was not given any medications during transport. He does have history of coronary artery disease but denies any chest pain. He feels much better at this point. He's not having any chest pain or shortness of breath. He does not feel lightheaded or weak. Patient has been taking his medications regularly but has not taken his midday dose of hydralazine.  Past Medical History  Diagnosis Date  . History of colonic polyps   . COPD (chronic obstructive pulmonary disease)   . CAD (coronary artery disease)     Mild plaque (cath "years ago"); abnormal Myoview 04/2013 with subsequent CABG x 5 with LIMA to LAD, SVG to OM1, SVG to DX, SVG to PD & PL.   Marland Kitchen GERD (gastroesophageal reflux disease)   . Hyperlipidemia   . Hypertension   . Vitamin D deficiency   . Meralgia paresthetica of left side 2011  . LBP (low back pain)   . PVD (peripheral vascular disease)     Stent to left common femoral and right superficial femoral.  2008.  50%  left renal   . Sleep apnea     mod OSA, central sleep apnea/hypoapnea syndrome 11/22/12  . Shortness of breath     "once in awhile; can happen at anytime" (08/26/2013)   Past Surgical  History  Procedure Laterality Date  . Lower extremity stents      bilateral lower extremities x 2  . Femoral-popliteal bypass graft  12/27/2012    Procedure: BYPASS GRAFT FEMORAL-POPLITEAL ARTERY;  Surgeon: Serafina Mitchell, MD;  Location: [facility] OR;  Service: Vascular;  Laterality: Right;  using non-reversed sapphenous vein.  . Cardiac catheterization  05/02/13    x2   . Coronary artery bypass graft N/A 05/06/2013    Procedure: CORONARY ARTERY BYPASS GRAFTING (CABG);  Surgeon: Melrose Nakayama, MD;  Location: [place];  Service: Open Heart Surgery;  Laterality: N/A;  Coronary artery bypass graft times five using left internal mammary artery and left greater saphenous vein via endovein harvest.   Family History  Problem Relation Age of Onset  . Heart disease Mother     No clear CAD  . Hypertension Mother   . Cancer Mother     ? colon ca  . Hyperlipidemia Mother   . Cancer Father     brain tumor  . Alzheimer's disease Father   . Colon cancer Neg Hx    History  Substance Use Topics  . Smoking status: Former Smoker -- 2.00 packs/day for 47 years    Types: Cigarettes    Quit date: 12/17/2012  . Smokeless tobacco: Never Used  . Alcohol Use: 2.4 oz/week  4 Shots of liquor per week     Comment: 08/26/2013 "3-4 mixed drinks/wk"    Review of Systems  All other systems reviewed and are negative.      Allergies  Benazepril  Home Medications   Current Outpatient Rx  Name  Route  Sig  Dispense  Refill  . amLODipine (NORVASC) 10 MG tablet   Oral   Take 0.5 tablets (5 mg total) by mouth daily.   90 tablet   3   . aspirin 325 MG EC tablet   Oral   Take 325 mg by mouth daily.         Marland Kitchen atenolol (TENORMIN) 50 MG tablet   Oral   Take 1 tablet (50 mg total) by mouth daily.   90 tablet   3   . atorvastatin (LIPITOR) 20 MG tablet   Oral   Take 20 mg by mouth daily at 6 PM.         . cholecalciferol (VITAMIN D) 1000 UNITS tablet   Oral   Take 1,000 Units by mouth daily.            . clonazePAM (KLONOPIN) 0.25 MG disintegrating tablet   Oral   Take 0.25 mg by mouth 2 (two) times daily as needed (anxiety).         . hydrALAZINE (APRESOLINE) 50 MG tablet   Oral   Take 1 tablet (50 mg total) by mouth 3 (three) times daily.   270 tablet   3   . ibuprofen (ADVIL,MOTRIN) 200 MG tablet   Oral   Take 200 mg by mouth daily as needed for headache.         . potassium chloride SA (K-DUR,KLOR-CON) 20 MEQ tablet   Oral   Take 1 tablet (20 mEq total) by mouth daily.   90 tablet   3   . sildenafil (VIAGRA) 100 MG tablet   Oral   Take 100 mg by mouth daily as needed. For erectile dysfunction         . vitamin B-12 (CYANOCOBALAMIN) 1000 MCG tablet   Oral   Take 1,000 mcg by mouth daily.          BP 127/90  Pulse 110  Temp(Src) 98.4 F (36.9 C) (Oral)  Resp 18  Ht 5\' 5"  (1.651 m)  Wt 173 lb (78.472 kg)  BMI 28.79 kg/m2  SpO2 95% Physical Exam  Nursing note and vitals reviewed. Constitutional: He appears well-developed and well-nourished. No distress.  HENT:  Head: Normocephalic and atraumatic.  Right Ear: External ear normal.  Left Ear: External ear normal.  Eyes: Conjunctivae are normal. Right eye exhibits no discharge. Left eye exhibits no discharge. No scleral icterus.  Neck: Neck supple. No tracheal deviation present.  Cardiovascular: Normal rate, regular rhythm and intact distal pulses.   Pulmonary/Chest: Effort normal and breath sounds normal. No stridor. No respiratory distress. He has no wheezes. He has no rales.  Abdominal: Soft. Bowel sounds are normal. He exhibits no distension. There is no tenderness. There is no rebound and no guarding.  Musculoskeletal: He exhibits no edema and no tenderness.  Neurological: He is alert. He has normal strength. No cranial nerve deficit (no facial droop, extraocular movements intact, no slurred speech) or sensory deficit. He exhibits normal muscle tone. He displays no seizure activity.  Coordination normal.  Skin: Skin is warm and dry. No rash noted.  Psychiatric: He has a normal mood and affect.    ED Course  Procedures (including  critical care time) Labs Review Labs Reviewed  CBC WITH DIFFERENTIAL - Abnormal; Notable for the following:    MCH 34.3 (*)    MCHC 36.1 (*)    All other components within normal limits  BASIC METABOLIC PANEL - Abnormal; Notable for the following:    Glucose, Bld 106 (*)    All other components within normal limits  POCT I-STAT TROPONIN I   Imaging Review No results found.  EKG Interpretation    Date/Time:  Tuesday January 07 2014 14:32:46 EST Ventricular Rate:  112 PR Interval:  208 QRS Duration: 92 QT Interval:  312 QTC Calculation: 426 R Axis:   76 Text Interpretation:  Sinus or ectopic atrial tachycardia Multiple ventricular premature complexes Borderline prolonged PR interval Borderline T abnormalities, inferior leads Since last tracing rate faster Confirmed by Nekoda Chock  MD-J, Davion Meara ([id number]) on 01/07/2014 2:42:41 PM            MDM   Final diagnoses:  HTN (hypertension)    Pt 's symptoms resolved by the time he came to the ED.  BP in the ED is normal after his normal mid day dose of medications.  Doubt ACS.  Could be a component of anxiety.  At this time there does not appear to be any evidence of an acute emergency medical condition and the patient appears stable for discharge with appropriate outpatient follow up.     Kathalene Frames, MD 01/07/14 [phone]

## 2014-01-07 NOTE — Discharge Instructions (Signed)
Arterial Hypertension °Arterial hypertension (high blood pressure) is a condition of elevated pressure in your blood vessels. Hypertension over a long period of time is a risk factor for strokes, heart attacks, and heart failure. It is also the leading cause of kidney (renal) failure.  °CAUSES  °· In Adults -- Over 90% of all hypertension has no known cause. This is called essential or primary hypertension. In the other 10% of people with hypertension, the increase in blood pressure is caused by another disorder. This is called secondary hypertension. Important causes of secondary hypertension are: °· Heavy alcohol use. °· Obstructive sleep apnea. °· Hyperaldosterosim (Conn's syndrome). °· Steroid use. °· Chronic kidney failure. °· Hyperparathyroidism. °· Medications. °· Renal artery stenosis. °· Pheochromocytoma. °· Cushing's disease. °· Coarctation of the aorta. °· Scleroderma renal crisis. °· Licorice (in excessive amounts). °· Drugs (cocaine, methamphetamine). °Your caregiver can explain any items above that apply to you. °· In Children -- Secondary hypertension is more common and should always be considered. °· Pregnancy -- Few women of childbearing age have high blood pressure. However, up to 10% of them develop hypertension of pregnancy. Generally, this will not harm the woman. It may be a sign of 3 complications of pregnancy: preeclampsia, HELLP syndrome, and eclampsia. Follow up and control with medication is necessary. °SYMPTOMS  °· This condition normally does not produce any noticeable symptoms. It is usually found during a routine exam. °· Malignant hypertension is a late problem of high blood pressure. It may have the following symptoms: °· Headaches. °· Blurred vision. °· End-organ damage (this means your kidneys, heart, lungs, and other organs are being damaged). °· Stressful situations can increase the blood pressure. If a person with normal blood pressure has their blood pressure go up while being  seen by their caregiver, this is often termed "white coat hypertension." Its importance is not known. It may be related with eventually developing hypertension or complications of hypertension. °· Hypertension is often confused with mental tension, stress, and anxiety. °DIAGNOSIS  °The diagnosis is made by 3 separate blood pressure measurements. They are taken at least 1 week apart from each other. If there is organ damage from hypertension, the diagnosis may be made without repeat measurements. °Hypertension is usually identified by having blood pressure readings: °· Above 140/90 mmHg measured in both arms, at 3 separate times, over a couple weeks. °· Over 130/80 mmHg should be considered a risk factor and may require treatment in patients with diabetes. °Blood pressure readings over 120/80 mmHg are called "pre-hypertension" even in non-diabetic patients. °To get a true blood pressure measurement, use the following guidelines. Be aware of the factors that can alter blood pressure readings. °· Take measurements at least 1 hour after caffeine. °· Take measurements 30 minutes after smoking and without any stress. This is another reason to quit smoking  it raises your blood pressure. °· Use a proper cuff size. Ask your caregiver if you are not sure about your cuff size. °· Most home blood pressure cuffs are automatic. They will measure systolic and diastolic pressures. The systolic pressure is the pressure reading at the start of sounds. Diastolic pressure is the pressure at which the sounds disappear. If you are elderly, measure pressures in multiple postures. Try sitting, lying or standing. °· Sit at rest for a minimum of 5 minutes before taking measurements. °· You should not be on any medications like decongestants. These are found in many cold medications. °· Record your blood pressure readings and review   them with your caregiver. °If you have hypertension: °· Your caregiver may do tests to be sure you do not have  secondary hypertension (see "causes" above). °· Your caregiver may also look for signs of metabolic syndrome. This is also called Syndrome X or Insulin Resistance Syndrome. You may have this syndrome if you have type 2 diabetes, abdominal obesity, and abnormal blood lipids in addition to hypertension. °· Your caregiver will take your medical and family history and perform a physical exam. °· Diagnostic tests may include blood tests (for glucose, cholesterol, potassium, and kidney function), a urinalysis, or an EKG. Other tests may also be necessary depending on your condition. °PREVENTION  °There are important lifestyle issues that you can adopt to reduce your chance of developing hypertension: °· Maintain a normal weight. °· Limit the amount of salt (sodium) in your diet. °· Exercise often. °· Limit alcohol intake. °· Get enough potassium in your diet. Discuss specific advice with your caregiver. °· Follow a DASH diet (dietary approaches to stop hypertension). This diet is rich in fruits, vegetables, and low-fat dairy products, and avoids certain fats. °PROGNOSIS  °Essential hypertension cannot be cured. Lifestyle changes and medical treatment can lower blood pressure and reduce complications. The prognosis of secondary hypertension depends on the underlying cause. Many people whose hypertension is controlled with medicine or lifestyle changes can live a normal, healthy life.  °RISKS AND COMPLICATIONS  °While high blood pressure alone is not an illness, it often requires treatment due to its short- and long-term effects on many organs. Hypertension increases your risk for: °· CVAs or strokes (cerebrovascular accident). °· Heart failure due to chronically high blood pressure (hypertensive cardiomyopathy). °· Heart attack (myocardial infarction). °· Damage to the retina (hypertensive retinopathy). °· Kidney failure (hypertensive nephropathy). °Your caregiver can explain list items above that apply to you. Treatment  of hypertension can significantly reduce the risk of complications. °TREATMENT  °· For overweight patients, weight loss and regular exercise are recommended. Physical fitness lowers blood pressure. °· Mild hypertension is usually treated with diet and exercise. A diet rich in fruits and vegetables, fat-free dairy products, and foods low in fat and salt (sodium) can help lower blood pressure. Decreasing salt intake decreases blood pressure in a 1/3 of people. °· Stop smoking if you are a smoker. °The steps above are highly effective in reducing blood pressure. While these actions are easy to suggest, they are difficult to achieve. Most patients with moderate or severe hypertension end up requiring medications to bring their blood pressure down to a normal level. There are several classes of medications for treatment. Blood pressure pills (antihypertensives) will lower blood pressure by their different actions. Lowering the blood pressure by 10 mmHg may decrease the risk of complications by as much as 25%. °The goal of treatment is effective blood pressure control. This will reduce your risk for complications. Your caregiver will help you determine the best treatment for you according to your lifestyle. What is excellent treatment for one person, may not be for you. °HOME CARE INSTRUCTIONS  °· Do not smoke. °· Follow the lifestyle changes outlined in the "Prevention" section. °· If you are on medications, follow the directions carefully. Blood pressure medications must be taken as prescribed. Skipping doses reduces their benefit. It also puts you at risk for problems. °· Follow up with your caregiver, as directed. °· If you are asked to monitor your blood pressure at home, follow the guidelines in the "Diagnosis" section above. °SEEK MEDICAL CARE   IF:  °· You think you are having medication side effects. °· You have recurrent headaches or lightheadedness. °· You have swelling in your ankles. °· You have trouble with  your vision. °SEEK IMMEDIATE MEDICAL CARE IF:  °· You have sudden onset of chest pain or pressure, difficulty breathing, or other symptoms of a heart attack. °· You have a severe headache. °· You have symptoms of a stroke (such as sudden weakness, difficulty speaking, difficulty walking). °MAKE SURE YOU:  °· Understand these instructions. °· Will watch your condition. °· Will get help right away if you are not doing well or get worse. °Document Released: 11/14/2005 Document Revised: 02/06/2012 Document Reviewed: 06/14/2007 °[employer]® Patient Information ©2014 [employer]. ° °

## 2014-01-07 NOTE — ED Notes (Signed)
Pt arrived by [facility] from home and they report that pt took BP at home and got a reading of 214/121 and pt c/o generalized weakness, blurred vision, nausea, and SHOB. Pt verbalizes he felt weak when he woke up and took his BP at 1300 and called EMS d/t high reading. Pt denies Chest pain put reports a HX of CABG last year and HTN. Pt takes medications at home for HTN

## 2014-01-07 NOTE — ED Notes (Signed)
MD at bedside. 

## 2014-01-08 ENCOUNTER — Encounter (HOSPITAL_COMMUNITY): Payer: 59

## 2014-01-09 ENCOUNTER — Telehealth (HOSPITAL_COMMUNITY): Payer: Self-pay | Admitting: *Deleted

## 2014-01-09 ENCOUNTER — Telehealth: Payer: Self-pay | Admitting: Cardiology

## 2014-01-09 NOTE — Telephone Encounter (Signed)
Pt was seen in ER for HTN 01/07/14.  His BP was improved in ER, the next day he came to rehab and BP was 138/70 he did not participate that day.  I have cleared pt to return to rehab on 01/10/14.

## 2014-01-09 NOTE — Progress Notes (Signed)
Late entry. Patient reported that he went to the ED on Tuesday with Hypertension and was not admitted. Blood pressure 138/70. I advised Douglas Christensen not to exercise. Our computers were down and I was not able to access EPIC when the patient came by. I talked with Cecilie Kicks FNP-C who reviewed the patients ED note and said Douglas Christensen can return to exercise tomorrow.

## 2014-01-10 ENCOUNTER — Encounter (HOSPITAL_COMMUNITY)
Admission: RE | Admit: 2014-01-10 | Discharge: 2014-01-10 | Disposition: A | Discharge status: Home / Self Care | Payer: 59 | Source: Ambulatory Visit | Attending: Cardiovascular Disease | Admitting: Cardiovascular Disease

## 2014-01-13 ENCOUNTER — Encounter (HOSPITAL_COMMUNITY)
Admission: RE | Admit: 2014-01-13 | Discharge: 2014-01-13 | Disposition: A | Discharge status: Home / Self Care | Payer: 59 | Source: Ambulatory Visit | Attending: Cardiovascular Disease | Admitting: Cardiovascular Disease

## 2014-01-14 ENCOUNTER — Ambulatory Visit: Payer: 59 | Admitting: Internal Medicine

## 2014-01-15 ENCOUNTER — Encounter (HOSPITAL_COMMUNITY)
Admission: RE | Admit: 2014-01-15 | Discharge: 2014-01-15 | Disposition: A | Discharge status: Home / Self Care | Payer: 59 | Source: Ambulatory Visit | Attending: Cardiovascular Disease | Admitting: Cardiovascular Disease

## 2014-01-17 ENCOUNTER — Encounter (HOSPITAL_COMMUNITY)
Admission: RE | Admit: 2014-01-17 | Discharge: 2014-01-17 | Disposition: A | Discharge status: Home / Self Care | Payer: 59 | Source: Ambulatory Visit | Attending: Cardiovascular Disease | Admitting: Cardiovascular Disease

## 2014-01-19 ENCOUNTER — Other Ambulatory Visit: Payer: Self-pay | Admitting: Cardiovascular Disease

## 2014-01-20 ENCOUNTER — Encounter (HOSPITAL_COMMUNITY)
Admission: RE | Admit: 2014-01-20 | Discharge: 2014-01-20 | Disposition: A | Discharge status: Home / Self Care | Payer: 59 | Source: Ambulatory Visit | Attending: Cardiovascular Disease | Admitting: Cardiovascular Disease

## 2014-01-22 ENCOUNTER — Encounter (HOSPITAL_COMMUNITY)
Admission: RE | Admit: 2014-01-22 | Discharge: 2014-01-22 | Disposition: A | Discharge status: Home / Self Care | Payer: 59 | Source: Ambulatory Visit | Attending: Cardiovascular Disease | Admitting: Cardiovascular Disease

## 2014-01-24 ENCOUNTER — Encounter (HOSPITAL_COMMUNITY)
Admission: RE | Admit: 2014-01-24 | Discharge: 2014-01-24 | Disposition: A | Discharge status: Home / Self Care | Payer: 59 | Source: Ambulatory Visit | Attending: Cardiovascular Disease | Admitting: Cardiovascular Disease

## 2014-01-27 ENCOUNTER — Ambulatory Visit (INDEPENDENT_AMBULATORY_CARE_PROVIDER_SITE_OTHER): Payer: 59 | Admitting: Internal Medicine

## 2014-01-27 ENCOUNTER — Encounter (HOSPITAL_COMMUNITY)
Admission: RE | Admit: 2014-01-27 | Discharge: 2014-01-27 | Disposition: A | Discharge status: Home / Self Care | Payer: 59 | Source: Ambulatory Visit | Attending: Cardiovascular Disease | Admitting: Cardiovascular Disease

## 2014-01-27 ENCOUNTER — Encounter: Payer: Self-pay | Admitting: Internal Medicine

## 2014-01-27 VITALS — BP 140/62 | HR 94 | Temp 98.6°F

## 2014-01-27 DIAGNOSIS — F528 Other sexual dysfunction not due to a substance or known physiological condition: Secondary | ICD-10-CM

## 2014-01-27 DIAGNOSIS — Z79899 Other long term (current) drug therapy: Secondary | ICD-10-CM | POA: Insufficient documentation

## 2014-01-27 DIAGNOSIS — Z87891 Personal history of nicotine dependence: Secondary | ICD-10-CM | POA: Insufficient documentation

## 2014-01-27 DIAGNOSIS — I70219 Atherosclerosis of native arteries of extremities with intermittent claudication, unspecified extremity: Secondary | ICD-10-CM | POA: Insufficient documentation

## 2014-01-27 DIAGNOSIS — I739 Peripheral vascular disease, unspecified: Secondary | ICD-10-CM | POA: Insufficient documentation

## 2014-01-27 DIAGNOSIS — E538 Deficiency of other specified B group vitamins: Secondary | ICD-10-CM

## 2014-01-27 DIAGNOSIS — I498 Other specified cardiac arrhythmias: Secondary | ICD-10-CM | POA: Insufficient documentation

## 2014-01-27 DIAGNOSIS — I2510 Atherosclerotic heart disease of native coronary artery without angina pectoris: Secondary | ICD-10-CM | POA: Insufficient documentation

## 2014-01-27 DIAGNOSIS — Z7982 Long term (current) use of aspirin: Secondary | ICD-10-CM | POA: Insufficient documentation

## 2014-01-27 DIAGNOSIS — J449 Chronic obstructive pulmonary disease, unspecified: Secondary | ICD-10-CM | POA: Insufficient documentation

## 2014-01-27 DIAGNOSIS — M549 Dorsalgia, unspecified: Secondary | ICD-10-CM

## 2014-01-27 DIAGNOSIS — R0609 Other forms of dyspnea: Secondary | ICD-10-CM

## 2014-01-27 DIAGNOSIS — J4489 Other specified chronic obstructive pulmonary disease: Secondary | ICD-10-CM | POA: Insufficient documentation

## 2014-01-27 DIAGNOSIS — Z23 Encounter for immunization: Secondary | ICD-10-CM

## 2014-01-27 DIAGNOSIS — I10 Essential (primary) hypertension: Secondary | ICD-10-CM | POA: Insufficient documentation

## 2014-01-27 DIAGNOSIS — IMO0002 Reserved for concepts with insufficient information to code with codable children: Secondary | ICD-10-CM

## 2014-01-27 DIAGNOSIS — G4733 Obstructive sleep apnea (adult) (pediatric): Secondary | ICD-10-CM | POA: Insufficient documentation

## 2014-01-27 DIAGNOSIS — R0683 Snoring: Secondary | ICD-10-CM

## 2014-01-27 DIAGNOSIS — Z951 Presence of aortocoronary bypass graft: Secondary | ICD-10-CM | POA: Insufficient documentation

## 2014-01-27 DIAGNOSIS — R0989 Other specified symptoms and signs involving the circulatory and respiratory systems: Secondary | ICD-10-CM

## 2014-01-27 DIAGNOSIS — Z5189 Encounter for other specified aftercare: Secondary | ICD-10-CM | POA: Insufficient documentation

## 2014-01-27 DIAGNOSIS — M5416 Radiculopathy, lumbar region: Secondary | ICD-10-CM

## 2014-01-27 NOTE — Progress Notes (Signed)
Patient ID: Douglas Christensen, male   DOB: [birth year], 63 y.o.   MRN: [id number]   Subjective:  F/u elev BP,  dizziness, lightheadedness x 2 wks  F/u Coronary artery disease with history of CABG - in card rehab Patient had no further episodes of chest heaviness.        HPIHis CP, neck pain and LBP are better F/u LBP: he saw Dr Annette Stable and had a CT myelogram a few months ago - no need for surgery (2013)   The patient presents for a follow-up of  Chronic PVD hypertension, chronic dyslipidemia, CAD. He is not smoking...  S/p R bypass 2014, s/p CABG - doing better  BP Readings from Last 3 Encounters:  01/27/14 140/62  01/07/14 141/93  12/30/13 138/70    Wt Readings from Last 3 Encounters:  01/07/14 173 lb (78.472 kg)  12/30/13 174 lb (78.926 kg)  11/26/13 175 lb (79.379 kg)      Review of Systems  Constitutional: Negative for appetite change and unexpected weight change.  HENT: Negative for nosebleeds and trouble swallowing.   Eyes: Negative for itching and visual disturbance.  Respiratory: Positive for shortness of breath. Negative for chest tightness.   Cardiovascular: Negative for palpitations and leg swelling.  Gastrointestinal: Negative for blood in stool and abdominal distention.  Genitourinary: Negative for frequency and hematuria.  Musculoskeletal: Positive for back pain and gait problem.  Skin: Negative for color change.  Neurological: Negative for dizziness, tremors and speech difficulty.  Psychiatric/Behavioral: Negative for suicidal ideas, confusion, sleep disturbance, dysphoric mood and agitation. The patient is not nervous/anxious.        Objective:   Physical Exam  Constitutional: He is oriented to person, place, and time. He appears well-developed. No distress.  HENT:  Mouth/Throat: Oropharynx is clear and moist.  Eyes: Conjunctivae are normal. Pupils are equal, round, and reactive to light.  Neck: Normal range of motion. No JVD present. No thyromegaly present.   Cardiovascular: Normal rate, regular rhythm, normal heart sounds and intact distal pulses.  Exam reveals no gallop and no friction rub.   No murmur heard. Pulmonary/Chest: Effort normal and breath sounds normal. No respiratory distress. He has no wheezes. He has no rales. He exhibits no tenderness.  Abdominal: Soft. Bowel sounds are normal. He exhibits no distension and no mass. There is no tenderness. There is no rebound and no guarding.  Musculoskeletal: Normal range of motion. He exhibits tenderness (R buttock, R hip, R leg, foot). He exhibits no edema.  Lymphadenopathy:    He has no cervical adenopathy.  Neurological: He is alert and oriented to person, place, and time. He has normal reflexes. No cranial nerve deficit. He exhibits normal muscle tone. Coordination normal.  Skin: Skin is warm and dry. No rash noted.  Very tanned  Psychiatric: He has a normal mood and affect. His behavior is normal. Judgment and thought content normal.  chest and R groin scar moles  Lab Results  Component Value Date   WBC 8.9 01/07/2014   HGB 15.9 01/07/2014   HCT 44.1 01/07/2014   PLT 211 01/07/2014   GLUCOSE 106* 01/07/2014   CHOL 152 08/26/2013   TRIG 125 08/26/2013   HDL 53 08/26/2013   LDLDIRECT 159.6 08/28/2012   LDLCALC 74 08/26/2013   ALT 28 08/27/2013   AST 33 08/27/2013   NA 142 01/07/2014   K 4.4 01/07/2014   CL 105 01/07/2014   CREATININE 0.80 01/07/2014   BUN 13 01/07/2014   CO2 23  01/07/2014   TSH 1.93 08/28/2012   PSA 0.65 08/28/2012   INR 1.00 08/27/2013   HGBA1C 5.8* 05/03/2013          Assessment & Plan:

## 2014-01-27 NOTE — Assessment & Plan Note (Signed)
1/14 - moderate OSA Appt w/Dr Melvyn Novas

## 2014-01-27 NOTE — Assessment & Plan Note (Signed)
He had a home OSA test w/Dr Melvyn Novas 1-2 years ago

## 2014-01-27 NOTE — Assessment & Plan Note (Signed)
Continue with current prescription therapy as reflected on the Med list.  

## 2014-01-27 NOTE — Assessment & Plan Note (Signed)
Better Continue with current prescription therapy as reflected on the Med list.  

## 2014-01-27 NOTE — Assessment & Plan Note (Signed)
Continue with current prn prescription therapy as reflected on the Med list.  

## 2014-01-27 NOTE — Assessment & Plan Note (Signed)
In card rehab Continue with current prescription therapy as reflected on the Med list.

## 2014-01-27 NOTE — Progress Notes (Deleted)
Pre visit review using our clinic review tool, if applicable. No additional management support is needed unless otherwise documented below in the visit note. 

## 2014-01-27 NOTE — Assessment & Plan Note (Signed)
Doing better.   

## 2014-01-29 ENCOUNTER — Encounter (HOSPITAL_COMMUNITY)
Admission: RE | Admit: 2014-01-29 | Discharge: 2014-01-29 | Disposition: A | Discharge status: Home / Self Care | Payer: 59 | Source: Ambulatory Visit | Attending: Cardiovascular Disease | Admitting: Cardiovascular Disease

## 2014-01-31 ENCOUNTER — Encounter (HOSPITAL_COMMUNITY)
Admission: RE | Admit: 2014-01-31 | Discharge: 2014-01-31 | Disposition: A | Discharge status: Home / Self Care | Payer: 59 | Source: Ambulatory Visit | Attending: Cardiovascular Disease | Admitting: Cardiovascular Disease

## 2014-02-03 ENCOUNTER — Encounter (HOSPITAL_COMMUNITY)
Admission: RE | Admit: 2014-02-03 | Discharge: 2014-02-03 | Disposition: A | Discharge status: Home / Self Care | Payer: 59 | Source: Ambulatory Visit | Attending: Cardiovascular Disease | Admitting: Cardiovascular Disease

## 2014-02-03 ENCOUNTER — Institutional Professional Consult (permissible substitution): Payer: 59 | Admitting: Internal Medicine

## 2014-02-03 ENCOUNTER — Encounter: Payer: Self-pay | Admitting: Pulmonary Disease

## 2014-02-03 ENCOUNTER — Ambulatory Visit (INDEPENDENT_AMBULATORY_CARE_PROVIDER_SITE_OTHER): Payer: 59 | Admitting: Pulmonary Disease

## 2014-02-03 VITALS — BP 116/70 | HR 77 | Ht 65.0 in | Wt 182.0 lb

## 2014-02-03 DIAGNOSIS — G4733 Obstructive sleep apnea (adult) (pediatric): Secondary | ICD-10-CM

## 2014-02-03 DIAGNOSIS — G4731 Primary central sleep apnea: Secondary | ICD-10-CM

## 2014-02-03 DIAGNOSIS — G4730 Sleep apnea, unspecified: Secondary | ICD-10-CM

## 2014-02-03 NOTE — Assessment & Plan Note (Signed)
He has snoring, sleep disruption, witnessed apnea and daytime sleepiness.  He has hx of COPD, CAD, and HTN.  His sleep study from 2013 showed moderate complex sleep apnea with both obstructive and central events.  I have reviewed the recent sleep study results with the patient.  We discussed how sleep apnea can affect various health problems including risks for hypertension, cardiovascular disease, and diabetes.  We also discussed how sleep disruption can increase risks for accident, such as while driving.  Weight loss as a means of improving sleep apnea was also reviewed.  Additional treatment options discussed were CPAP therapy, oral appliance, and surgical intervention.  Will arrange for in lab CPAP titration study.

## 2014-02-03 NOTE — Progress Notes (Deleted)
   Subjective:    Patient ID: Douglas Christensen, male    DOB: 12/20/1950, 63 y.o.   MRN: [id number]  HPI    Review of Systems  Constitutional: Negative for fever, chills, diaphoresis, activity change, appetite change, fatigue and unexpected weight change.  HENT: Negative for congestion, dental problem, ear discharge, ear pain, facial swelling, hearing loss, mouth sores, nosebleeds, postnasal drip, rhinorrhea, sinus pressure, sneezing, sore throat, tinnitus, trouble swallowing and voice change.   Eyes: Negative for photophobia, discharge, itching and visual disturbance.  Respiratory: Positive for shortness of breath. Negative for apnea, cough, choking, chest tightness, wheezing and stridor.   Cardiovascular: Negative for chest pain, palpitations and leg swelling.  Gastrointestinal: Negative for nausea, vomiting, abdominal pain, constipation, blood in stool and abdominal distention.  Genitourinary: Negative for dysuria, urgency, frequency, hematuria, flank pain, decreased urine volume and difficulty urinating.  Musculoskeletal: Negative for arthralgias, back pain, gait problem, joint swelling, myalgias, neck pain and neck stiffness.  Skin: Negative for color change, pallor and rash.  Neurological: Negative for dizziness, tremors, seizures, syncope, speech difficulty, weakness, light-headedness, numbness and headaches.  Hematological: Negative for adenopathy. Does not bruise/bleed easily.  Psychiatric/Behavioral: Negative for confusion, sleep disturbance and agitation. The patient is not nervous/anxious.        Objective:   Physical Exam        Assessment & Plan:

## 2014-02-03 NOTE — Patient Instructions (Signed)
Will schedule CPAP titration study Will call to schedule follow up after CPAP study reviewed
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
## 2014-02-05 ENCOUNTER — Encounter (HOSPITAL_COMMUNITY)
Admission: RE | Admit: 2014-02-05 | Discharge: 2014-02-05 | Disposition: A | Discharge status: Home / Self Care | Payer: 59 | Source: Ambulatory Visit | Attending: Cardiovascular Disease | Admitting: Cardiovascular Disease

## 2014-02-05 NOTE — Progress Notes (Signed)
Douglas Christensen reported having tendonitis and does not feel like exercising today. [name] will return to exercise on Friday.

## 2014-02-06 ENCOUNTER — Ambulatory Visit (INDEPENDENT_AMBULATORY_CARE_PROVIDER_SITE_OTHER): Payer: 59 | Admitting: Internal Medicine

## 2014-02-06 ENCOUNTER — Encounter: Payer: Self-pay | Admitting: Internal Medicine

## 2014-02-06 VITALS — BP 120/70 | HR 63 | Temp 98.4°F | Resp 15 | Wt 180.1 lb

## 2014-02-06 DIAGNOSIS — M25461 Effusion, right knee: Secondary | ICD-10-CM

## 2014-02-06 DIAGNOSIS — M25569 Pain in unspecified knee: Secondary | ICD-10-CM

## 2014-02-06 DIAGNOSIS — M25469 Effusion, unspecified knee: Secondary | ICD-10-CM

## 2014-02-06 NOTE — Progress Notes (Signed)
   Subjective:    Patient ID: Douglas Christensen, male    DOB: July 14, 1951, 63 y.o.   MRN: [id number]  HPI  Symptoms began 02/04/14 as acute pain in the right knee described as sharp up to level X. It started while he was driving; but it increased in intensity after he got out of his truck. There was no injury or predisposing factor prior to the onset of the pain. It does last as long as weight bearing. Ice does not help; heat has been of only partial benefit  The joint has been stiff , swollen and associated with redness   Remotely he had tendinitis in one of his knees. He cannot remember which knee and whether it was similar to the present illness.  He has no history of gout  Review of Systems  He specifically denies fever, chills, or sweats  He has had no chest pain, palpitations, or shortness of breath.    Objective:   Physical Exam  He appears adequately nourished in no acute distress  Is of no lymphadenopathy about the neck or axilla  Chest reveals scattered mild rhonchi  Heart rate is slow without significant murmur or gallop  The right knee is visibly swollen and mildly erythematous. There is decreased range of motion due to pain. The kneecap is ballotable. There is increased warmth to touch over the right knee  There was no pain in the calf prior to the onset of the knee pain.       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 Also rule out infectious process. The absence of fever mitigates against the second possibility  Plan: Stat orthopedic consult for diagnostic tap.

## 2014-02-06 NOTE — Progress Notes (Signed)
Pre visit review using our clinic review tool, if applicable. No additional management support is needed unless otherwise documented below in the visit note. 

## 2014-02-06 NOTE — Patient Instructions (Signed)
I recommend an Orthopedic consultation to determine optimal therapy

## 2014-02-07 ENCOUNTER — Encounter (HOSPITAL_COMMUNITY)
Admission: RE | Admit: 2014-02-07 | Discharge: 2014-02-07 | Disposition: A | Discharge status: Home / Self Care | Payer: 59 | Source: Ambulatory Visit | Attending: Cardiovascular Disease | Admitting: Cardiovascular Disease

## 2014-02-07 ENCOUNTER — Ambulatory Visit: Payer: 59 | Admitting: Internal Medicine

## 2014-02-07 NOTE — Progress Notes (Addendum)
Douglas Christensen returned to exercise today and exercised on the arm ergometer only so he wouldn't put any pressure on his knee. No complaints voiced today with exercise.

## 2014-02-10 ENCOUNTER — Encounter (HOSPITAL_COMMUNITY)
Admission: RE | Admit: 2014-02-10 | Discharge: 2014-02-10 | Disposition: A | Discharge status: Home / Self Care | Payer: 59 | Source: Ambulatory Visit | Attending: Cardiovascular Disease | Admitting: Cardiovascular Disease

## 2014-02-12 ENCOUNTER — Encounter (HOSPITAL_COMMUNITY)
Admission: RE | Admit: 2014-02-12 | Discharge: 2014-02-12 | Disposition: A | Discharge status: Home / Self Care | Payer: 59 | Source: Ambulatory Visit | Attending: Cardiovascular Disease | Admitting: Cardiovascular Disease

## 2014-02-14 ENCOUNTER — Encounter (HOSPITAL_COMMUNITY): Payer: 59

## 2014-02-18 ENCOUNTER — Ambulatory Visit: Payer: 59 | Admitting: Internal Medicine

## 2014-02-21 ENCOUNTER — Ambulatory Visit (HOSPITAL_BASED_OUTPATIENT_CLINIC_OR_DEPARTMENT_OTHER): Payer: 59 | Attending: Pulmonary Disease

## 2014-02-21 VITALS — Ht 65.0 in | Wt 178.0 lb

## 2014-02-21 DIAGNOSIS — R0609 Other forms of dyspnea: Secondary | ICD-10-CM | POA: Insufficient documentation

## 2014-02-21 DIAGNOSIS — Z9989 Dependence on other enabling machines and devices: Secondary | ICD-10-CM

## 2014-02-21 DIAGNOSIS — J449 Chronic obstructive pulmonary disease, unspecified: Secondary | ICD-10-CM | POA: Insufficient documentation

## 2014-02-21 DIAGNOSIS — R0989 Other specified symptoms and signs involving the circulatory and respiratory systems: Secondary | ICD-10-CM | POA: Insufficient documentation

## 2014-02-21 DIAGNOSIS — G4739 Other sleep apnea: Secondary | ICD-10-CM

## 2014-02-21 DIAGNOSIS — G4733 Obstructive sleep apnea (adult) (pediatric): Secondary | ICD-10-CM

## 2014-02-21 DIAGNOSIS — J4489 Other specified chronic obstructive pulmonary disease: Secondary | ICD-10-CM | POA: Insufficient documentation

## 2014-02-21 DIAGNOSIS — G4731 Primary central sleep apnea: Secondary | ICD-10-CM

## 2014-02-24 ENCOUNTER — Encounter: Payer: Self-pay | Admitting: Internal Medicine

## 2014-02-24 ENCOUNTER — Ambulatory Visit (INDEPENDENT_AMBULATORY_CARE_PROVIDER_SITE_OTHER): Payer: 59 | Admitting: Internal Medicine

## 2014-02-24 VITALS — BP 152/66 | HR 72 | Temp 98.0°F | Resp 16 | Wt 179.0 lb

## 2014-02-24 DIAGNOSIS — D489 Neoplasm of uncertain behavior, unspecified: Secondary | ICD-10-CM

## 2014-02-24 DIAGNOSIS — D485 Neoplasm of uncertain behavior of skin: Secondary | ICD-10-CM

## 2014-02-24 NOTE — Assessment & Plan Note (Signed)
See procedure note.

## 2014-02-24 NOTE — Patient Instructions (Signed)
Postprocedure instructions :    A Band-Aid should be  changed twice daily. You can take a shower tomorrow.  Keep the wounds clean. You can wash them with liquid soap and water. Pat dry with gauze or a Kleenex tissue  Before applying antibiotic ointment and a Band-Aid.   You need to report immediately  if fever, chills or any signs of infection develop.    The biopsy results should be available in 1 -2 weeks. 

## 2014-02-24 NOTE — Progress Notes (Signed)
Procedure Note :     Procedure :  Skin biopsy   Indication:  Changing mole (s ),  Suspicious lesion(s)   Risks including unsuccessful procedure , bleeding, infection, bruising, scar, a need for another complete procedure and others were explained to the patient in detail as well as the benefits. Informed consent was obtained and signed.   The patient was placed in a decubitus position.  Lesion #1 on  R neck   measuring  6x4   mm   Skin over lesion #1  was prepped with Betadine and alcohol  and anesthetized with 1 cc of 2% lidocaine and epinephrine, using a 25-gauge 1 inch needle.  Shave biopsy with a sterile Dermablade was carried out in the usual fashion. Hyfrecator was used to destroy the rest of the lesion potentially left behind and for hemostasis. Band-Aid was applied with antibiotic ointment.    Lesion #2 on R neck distal from #1    measuring 3x3  mm   Skin over lesion #2  was prepped with Betadine and alcohol  and anesthetized with 1 cc of 2% lidocaine and epinephrine, using a 25-gauge 1 inch needle.  Shave biopsy with a sterile Dermablade was carried out in the usual fashion. Hyfrecator was used to destroy the rest of the lesion potentially left behind and for hemostasis. Band-Aid was applied with antibiotic ointment.  Lesion #3 on  R lat chest  measuring 11x6  mm   Skin over lesion #3  was prepped with Betadine and alcohol  and anesthetized with 1 cc of 2% lidocaine and epinephrine, using a 25-gauge 1 inch needle.  Shave biopsy with a sterile Dermablade was carried out in the usual fashion. Hyfrecator was used to destroy the rest of the lesion potentially left behind and for hemostasis. Band-Aid was applied with antibiotic ointment.   Lesion #4 on  R lat chest below #3  measuring 11x6  mm   Skin over lesion #4  was prepped with Betadine and alcohol  and anesthetized with 1 cc of 2% lidocaine and epinephrine, using a 25-gauge 1 inch needle.  Shave biopsy with a sterile Dermablade  was carried out in the usual fashion. Hyfrecator was used to destroy the rest of the lesion potentially left behind and for hemostasis. Band-Aid was applied with antibiotic ointment.   Lesion #5 on  R lat chest below #4  measuring 10x4  mm   Skin over lesion #5  was prepped with Betadine and alcohol  and anesthetized with 1 cc of 2% lidocaine and epinephrine, using a 25-gauge 1 inch needle.  Shave biopsy with a sterile Dermablade was carried out in the usual fashion. Hyfrecator was used to destroy the rest of the lesion potentially left behind and for hemostasis. Band-Aid was applied with antibiotic ointment.  Lesion #6 on  L postero-lat chest measuring 9x4  mm   Skin over lesion #6  was prepped with Betadine and alcohol  and anesthetized with 1 cc of 2% lidocaine and epinephrine, using a 25-gauge 1 inch needle.  Shave biopsy with a sterile Dermablade was carried out in the usual fashion. Hyfrecator was used to destroy the rest of the lesion potentially left behind and for hemostasis. Band-Aid was applied with antibiotic ointment.   Tolerated well. Complications none.   #1,2,3 were sent to path Lab; others were discarded

## 2014-02-24 NOTE — Progress Notes (Signed)
Pre visit review using our clinic review tool, if applicable. No additional management support is needed unless otherwise documented below in the visit note. 

## 2014-03-04 ENCOUNTER — Telehealth: Payer: Self-pay | Admitting: Pulmonary Disease

## 2014-03-04 DIAGNOSIS — G4733 Obstructive sleep apnea (adult) (pediatric): Secondary | ICD-10-CM

## 2014-03-04 NOTE — Telephone Encounter (Signed)
BiPAP 02/21/14 >> BiPAP 17/13 cm H2O >> AHI 0, + R.  Centrals with lower pressures.  Will have my nurse inform pt that he did well with BiPAP during sleep study.  Please arrange for BiPAP 17/13 cm H2O with heated humidity and mask of choice.  Please arrange for ROV 2 months after BiPAP set up.

## 2014-03-04 NOTE — Telephone Encounter (Signed)
Pt is aware of BiPAP results. Order has been placed.  Recall will be placed for 2 month ROV.

## 2014-03-04 NOTE — Sleep Study (Signed)
[place]  NAME: Douglas Christensen DATE OF BIRTH:  Apr 27, 1951 MEDICAL RECORD NUMBER [id number]  LOCATION: [facility]  PHYSICIAN: Chesley Mires, M.D. DATE OF STUDY: 02/21/2014  SLEEP STUDY TYPE: Titration study.               REFERRING PHYSICIAN: Chesley Mires, MD  INDICATION FOR STUDY:  Douglas Christensen is a 63 y.o. male who has history of snoring, sleep disruption, and daytime sleepiness.  He also has history of CAD, HTN, and COPD.  He had home sleep study from 11/23/12 which showed complex sleep apnea with an AHI of 15.8 and SaO2 low of 83%.  He is referred to the sleep lab for a titration study..  EPWORTH SLEEPINESS SCORE: 11. HEIGHT: 5\' 5"  (165.1 cm)  WEIGHT: 80.74 kg (178 lb)    Body mass index is 29.62 kg/(m^2).  NECK SIZE: 17 in.  MEDICATIONS:  Current Outpatient Prescriptions on File Prior to Visit  Medication Sig Dispense Refill  . amLODipine (NORVASC) 10 MG tablet Take 0.5 tablets (5 mg total) by mouth daily.  90 tablet  3  . aspirin 325 MG EC tablet Take 325 mg by mouth daily.      Marland Kitchen atenolol (TENORMIN) 50 MG tablet Take 1 tablet (50 mg total) by mouth daily.  90 tablet  3  . atorvastatin (LIPITOR) 20 MG tablet Take 20 mg by mouth daily at 6 PM.      . cholecalciferol (VITAMIN D) 1000 UNITS tablet Take 1,000 Units by mouth daily.        . clonazePAM (KLONOPIN) 0.25 MG disintegrating tablet Take 0.25 mg by mouth 2 (two) times daily as needed (anxiety).      . hydrALAZINE (APRESOLINE) 50 MG tablet Take 1 tablet (50 mg total) by mouth 3 (three) times daily.  270 tablet  3  . ibuprofen (ADVIL,MOTRIN) 200 MG tablet Take 200 mg by mouth daily as needed for headache.      . potassium chloride SA (K-DUR,KLOR-CON) 20 MEQ tablet Take 1 tablet (20 mEq total) by mouth daily.  90 tablet  3  . sildenafil (VIAGRA) 100 MG tablet Take 100 mg by mouth daily as needed. For erectile dysfunction      . vitamin B-12 (CYANOCOBALAMIN) 1000 MCG tablet Take 1,000 mcg  by mouth daily.       No current facility-administered medications on file prior to visit.    SLEEP ARCHITECTURE:  Total recording time:  360 minutes.  Total sleep time was: 316.5 minutes.  Sleep efficiency: 87.9%.  Sleep latency: 8 minutes.  REM latency: 91.5 minutes.  Stage N1: 3.3%.  Stage N2: 76.3%.  Stage N3: 2.1%.  Stage R:  18.3%.  Supine sleep: 75.5 minutes.  Non-supine sleep: 241 minutes.  CARDIAC DATA:  Average heart rate: 49 beats per minute. Rhythm strip: Sinus rhythm with PVC's and PAC's.  RESPIRATORY DATA: Average respiratory rate: 15. Snoring: Mild.  He was started on CPAP 5 and increased to 14 cm H2O.  This controlled his obstructive events, but he had persistent central apneas.  He was then transitioned to BiPAP starting at 15/11 and increased to 17/13 cm H2O.  With BiPAP at 17/13 cm H2O his AHI was reduced to 0, and he was observed in REM sleep.  MOVEMENT/PARASOMNIA:  Periodic limb movement: 0.  Period limb movements with arousals: 0. Restroom trips: None.  OXYGEN DATA:  Baseline oxygenation: 94%. Lowest SaO2: 87%. Time spent below SaO2 90%: 3.6 minutes.  Supplemental oxygen used: None.  IMPRESSION/ RECOMMENDATION:   This was a successful titration study.  He did well with BiPAP 17/13 cm H2O.  He was fitted with a small size Fisher Paykel Simplus mask.   Of note is that he had persistent central apneas in spite of high CPAP settings, and as a result required BiPAP therapy.   Additional therapies include weight loss, CPAP, oral appliance, or surgical evaluation.   Chesley Mires, M.D. Diplomate, Tax adviser of Sleep Medicine  ELECTRONICALLY SIGNED ON:  03/04/2014, 1:07 AM [place] PH: [phone]   FX: [phone] [place]

## 2014-03-05 ENCOUNTER — Other Ambulatory Visit: Payer: Self-pay | Admitting: Internal Medicine

## 2014-03-05 ENCOUNTER — Telehealth: Payer: Self-pay | Admitting: Pulmonary Disease

## 2014-03-05 NOTE — Telephone Encounter (Signed)
Ok to refill? Last OV 3.30.15

## 2014-03-05 NOTE — Telephone Encounter (Signed)
I spoke with Douglas Christensen who states the order for Douglas Christensen never came to their box as the phone note the order come from was created in E-Link. Order will be sent to Douglas Christensen and can take up to 72 hours for the patient to hear from Douglas Christensen providing that his insurance does not require a pre cert for BiPAP.   I explained to the patient about the order being sent to Douglas Christensen and up to 72 hours for order providing his insurance does not require a pre cert. Pt understood and will await a call from Douglas Christensen. Nothing further needed at this time; will sign off on message.

## 2014-03-07 ENCOUNTER — Encounter (HOSPITAL_BASED_OUTPATIENT_CLINIC_OR_DEPARTMENT_OTHER): Payer: 59

## 2014-03-13 ENCOUNTER — Ambulatory Visit (INDEPENDENT_AMBULATORY_CARE_PROVIDER_SITE_OTHER): Payer: 59 | Admitting: Nurse Practitioner

## 2014-03-13 ENCOUNTER — Encounter: Payer: Self-pay | Admitting: Nurse Practitioner

## 2014-03-13 VITALS — BP 120/60 | HR 76 | Ht 65.0 in | Wt 178.8 lb

## 2014-03-13 DIAGNOSIS — I10 Essential (primary) hypertension: Secondary | ICD-10-CM

## 2014-03-13 DIAGNOSIS — Z951 Presence of aortocoronary bypass graft: Secondary | ICD-10-CM

## 2014-03-13 DIAGNOSIS — E785 Hyperlipidemia, unspecified: Secondary | ICD-10-CM

## 2014-03-13 LAB — HEPATIC FUNCTION PANEL
ALT: 51 U/L (ref 0–53)
AST: 38 U/L — ABNORMAL HIGH (ref 0–37)
Albumin: 3.6 g/dL (ref 3.5–5.2)
Alkaline Phosphatase: 64 U/L (ref 39–117)
Bilirubin, Direct: 0 mg/dL (ref 0.0–0.3)
Total Bilirubin: 0.6 mg/dL (ref 0.3–1.2)
Total Protein: 7 g/dL (ref 6.0–8.3)

## 2014-03-13 LAB — BASIC METABOLIC PANEL
BUN: 10 mg/dL (ref 6–23)
CO2: 24 mEq/L (ref 19–32)
Calcium: 8.8 mg/dL (ref 8.4–10.5)
Chloride: 108 mEq/L (ref 96–112)
Creatinine, Ser: 0.9 mg/dL (ref 0.4–1.5)
GFR: 95.45 mL/min (ref >60.00)
Glucose, Bld: 109 mg/dL — ABNORMAL HIGH (ref 70–99)
Potassium: 3.3 mEq/L — ABNORMAL LOW (ref 3.5–5.1)
Sodium: 140 mEq/L (ref 135–145)

## 2014-03-13 LAB — LIPID PANEL
Cholesterol: 154 mg/dL (ref 0–200)
HDL: 45.6 mg/dL (ref >39.00)
LDL Cholesterol: 75 mg/dL (ref 0–99)
Total CHOL/HDL Ratio: 3
Triglycerides: 169 mg/dL — ABNORMAL HIGH (ref 0.0–149.0)
VLDL: 33.8 mg/dL (ref 0.0–40.0)

## 2014-03-13 NOTE — Progress Notes (Signed)
Douglas Christensen Date of Birth: 19-Feb-1951 Medical Record [id number]  History of Present Illness: Douglas Christensen is seen back today for a follow up check. Seen for Dr. Burt Knack. This is a 5 month check. Has CAD with recent CABG x 5 in June of 2014, PVD with prior right SFA stenting and left common iliac stenting in 2008/left external iliac stenting in 2012, GERD, HLD, HTN and OSA. He has a history of heavy ethanol use and had some issues with agitation early on with his bypass surgery. He was treated with Librium taper. His Ef was normal at time of his cath at 55 to 60% in June of 2014.   Last seen here in November by me in 2014 after trying to get his BP under control. Has since seen primary care several times, has been to Dr. Halford Chessman for OSA - on Bipap.  Comes in today. Here alone. He is here to get approval to go back to work. He tells me that he has gotten social security disability. Tells me that he needs a note saying that he cannot work. 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 No chest pain. No palpitations. Does have dyspnea with exertion. Has balance issues. Still drinking - does not really quantify. BP ok. Tries to minimize his salt. No smoking.   Current Outpatient Prescriptions  Medication Sig Dispense Refill  . amLODipine (NORVASC) 10 MG tablet Take 0.5 tablets (5 mg total) by mouth daily.  90 tablet  3  . aspirin 325 MG EC tablet Take 325 mg by mouth daily.      Marland Kitchen atenolol (TENORMIN) 50 MG tablet Take 1 tablet (50 mg total) by mouth daily.  90 tablet  3  . atorvastatin (LIPITOR) 20 MG tablet Take 20 mg by mouth daily at 6 PM.      . cholecalciferol (VITAMIN D) 1000 UNITS tablet Take 1,000 Units by mouth daily.        . clonazePAM (KLONOPIN) 0.25 MG disintegrating tablet DISSOLVE 1 TABLET BY MOUTH TWICE DAILY AS NEEDED  60 tablet  3  . hydrALAZINE (APRESOLINE) 50 MG tablet Take 1 tablet (50 mg total) by mouth 3 (three) times daily.   270 tablet  3  . ibuprofen (ADVIL,MOTRIN) 200 MG tablet Take 200 mg by mouth daily as needed for headache.      . potassium chloride SA (K-DUR,KLOR-CON) 20 MEQ tablet Take 1 tablet (20 mEq total) by mouth daily.  90 tablet  3  . sildenafil (VIAGRA) 100 MG tablet Take 100 mg by mouth daily as needed. For erectile dysfunction      . vitamin B-12 (CYANOCOBALAMIN) 1000 MCG tablet Take 1,000 mcg by mouth daily.       No current facility-administered medications for this visit.    Allergies  Allergen Reactions  . Benazepril Cough    Past Medical History  Diagnosis Date  . History of colonic polyps   . COPD (chronic obstructive pulmonary disease)   . CAD (coronary artery disease)     Mild plaque (cath "years ago"); abnormal Myoview 04/2013 with subsequent CABG x 5 with LIMA to LAD, SVG to OM1, SVG to DX, SVG to PD & PL.   Marland Kitchen GERD (gastroesophageal reflux disease)   . Hyperlipidemia   . Hypertension   . Vitamin D deficiency   . Meralgia paresthetica of left side 2011  . LBP (low back pain)   . PVD (peripheral vascular  disease)     Stent to left common femoral and right superficial femoral.  2008.  50%  left renal   . Sleep apnea     mod OSA, central sleep apnea/hypoapnea syndrome 11/22/12  . Shortness of breath     "once in awhile; can happen at anytime" (08/26/2013)    Past Surgical History  Procedure Laterality Date  . Lower extremity stents      bilateral lower extremities x 2  . Femoral-popliteal bypass graft  12/27/2012    Procedure: BYPASS GRAFT FEMORAL-POPLITEAL ARTERY;  Surgeon: Serafina Mitchell, MD;  Location: [facility] OR;  Service: Vascular;  Laterality: Right;  using non-reversed sapphenous vein.  . Cardiac catheterization  05/02/13    x2   . Coronary artery bypass graft N/A 05/06/2013    Procedure: CORONARY ARTERY BYPASS GRAFTING (CABG);  Surgeon: Melrose Nakayama, MD;  Location: [facility];  Service: Open Heart Surgery;  Laterality: N/A;  Coronary artery bypass graft times five using  left internal mammary artery and left greater saphenous vein via endovein harvest.    History  Smoking status  . Former Smoker -- 2.00 packs/day for 47 years  . Types: Cigarettes  . Quit date: 12/17/2012  Smokeless tobacco  . Never Used    History  Alcohol Use  . 2.4 oz/week  . 4 Shots of liquor per week    Comment: 08/26/2013 "3-4 mixed drinks/wk"    Family History  Problem Relation Age of Onset  . Heart disease Mother     No clear CAD  . Hypertension Mother   . Cancer Mother     ? colon ca  . Hyperlipidemia Mother   . Cancer Father     brain tumor  . Alzheimer's disease Father   . Colon cancer Neg Hx     Review of Systems: The review of systems is per the HPI.  All other systems were reviewed and are negative.  Physical Exam: BP 120/60  Pulse 76  Ht 5\' 5"  (1.651 m)  Wt 178 lb 12.8 oz (81.103 kg)  BMI 29.75 kg/m2  SpO2 98% Patient is very pleasant and in no acute distress. Weight is stable. Skin is warm and dry. Quite tanned. Color is normal.  HEENT is unremarkable. Normocephalic/atraumatic. PERRL. Sclera are nonicteric. Neck is supple. No masses. No JVD. Lungs are clear. Cardiac exam shows a regular rate and rhythm. Abdomen is soft. Extremities are without edema. Gait and ROM are intact. No gross neurologic deficits noted.  Wt Readings from Last 3 Encounters:  03/13/14 178 lb 12.8 oz (81.103 kg)  02/24/14 179 lb (81.194 kg)  02/21/14 178 lb (80.74 kg)     LABORATORY DATA: PENDING  Lab Results  Component Value Date   WBC 8.9 01/07/2014   HGB 15.9 01/07/2014   HCT 44.1 01/07/2014   PLT 211 01/07/2014   GLUCOSE 106* 01/07/2014   CHOL 152 08/26/2013   TRIG 125 08/26/2013   HDL 53 08/26/2013   LDLDIRECT 159.6 08/28/2012   LDLCALC 74 08/26/2013   ALT 28 08/27/2013   AST 33 08/27/2013   NA 142 01/07/2014   K 4.4 01/07/2014   CL 105 01/07/2014   CREATININE 0.80 01/07/2014   BUN 13 01/07/2014   CO2 23 01/07/2014   TSH 1.93 08/28/2012   PSA 0.65 08/28/2012   INR 1.00  08/27/2013   HGBA1C 5.8* 05/03/2013     Assessment / Plan: 1. CAD - prior CABG in June of 2014 - has normal EF -  no current chest pain. I have explained to him that from our standpoint, he is not restricted to return to work. He certainly has other medical conditions that are probably limiting him - but from a sole cardiac standpoint, he is felt to be stable. I have suggested he touch base with PCP.   2. HTN - BP is great. No change in therapy.   3. HLD - recheck his lipids today.   4. Dyspnea - long history of tobacco abuse - normal EF. Sees pulmonary. Now on bipap.   5. Alcohol  - continues to drink. Encouraged to cut back/abstain.   See back in 4 months. No change in current therapy.   Patient is agreeable to this plan and will call if any problems develop in the interim.   [name], RN, [facility] [address] [place], [place]  [phone]

## 2014-03-13 NOTE — Patient Instructions (Signed)
Change recall visit with Dr. Burt Knack for 4 months  We will check lab today  I think you are doing well  Stay on your current medicines  Call the [place] office at [phone] if you have any questions, problems or concerns.

## 2014-03-14 ENCOUNTER — Other Ambulatory Visit: Payer: Self-pay | Admitting: *Deleted

## 2014-03-14 ENCOUNTER — Telehealth: Payer: Self-pay | Admitting: Pulmonary Disease

## 2014-03-14 DIAGNOSIS — E876 Hypokalemia: Secondary | ICD-10-CM

## 2014-03-14 MED ORDER — POTASSIUM CHLORIDE CRYS ER 20 MEQ PO TBCR: 20.0000 meq | EXTENDED_RELEASE_TABLET | Freq: Two times a day (BID) | ORAL | Status: DC

## 2014-03-14 NOTE — Telephone Encounter (Signed)
Spoke with the pt  He reports that he did get a call from [facility] on 03/07/14 stating that they received an order for BIPAP  However, he has not heard from them since and still has no machine  I called [facility] and spoke with Raquel Sarna and explained the situation  She states that the machine has been ordered and the RT is going to contact the pt now

## 2014-03-24 ENCOUNTER — Encounter: Payer: Self-pay | Admitting: Internal Medicine

## 2014-03-24 ENCOUNTER — Ambulatory Visit (INDEPENDENT_AMBULATORY_CARE_PROVIDER_SITE_OTHER): Payer: 59 | Admitting: Internal Medicine

## 2014-03-24 VITALS — BP 142/82 | HR 66 | Temp 97.3°F | Resp 16 | Ht 65.0 in | Wt 178.2 lb

## 2014-03-24 DIAGNOSIS — E785 Hyperlipidemia, unspecified: Secondary | ICD-10-CM

## 2014-03-24 DIAGNOSIS — I10 Essential (primary) hypertension: Secondary | ICD-10-CM

## 2014-03-24 DIAGNOSIS — M5416 Radiculopathy, lumbar region: Secondary | ICD-10-CM

## 2014-03-24 DIAGNOSIS — J309 Allergic rhinitis, unspecified: Secondary | ICD-10-CM | POA: Insufficient documentation

## 2014-03-24 DIAGNOSIS — E538 Deficiency of other specified B group vitamins: Secondary | ICD-10-CM

## 2014-03-24 DIAGNOSIS — D45 Polycythemia vera: Secondary | ICD-10-CM

## 2014-03-24 DIAGNOSIS — D751 Secondary polycythemia: Secondary | ICD-10-CM

## 2014-03-24 DIAGNOSIS — IMO0002 Reserved for concepts with insufficient information to code with codable children: Secondary | ICD-10-CM

## 2014-03-24 MED ORDER — METHYLPREDNISOLONE ACETATE 80 MG/ML IJ SUSP
80.0000 mg | Freq: Once | INTRAMUSCULAR | Status: AC
  Administered 2014-03-24: 80 mg via INTRAMUSCULAR

## 2014-03-24 NOTE — Assessment & Plan Note (Signed)
Continue with current prescription therapy as reflected on the Med list.  

## 2014-03-24 NOTE — Assessment & Plan Note (Signed)
On phlebotomies prior - nl HCT lately

## 2014-03-24 NOTE — Assessment & Plan Note (Signed)
Netty pot  Zyrtec

## 2014-03-24 NOTE — Patient Instructions (Signed)
Netty pot  Zyrtec

## 2014-03-24 NOTE — Progress Notes (Signed)
Pre visit review using our clinic review tool, if applicable. No additional management support is needed unless otherwise documented below in the visit note/SLS  

## 2014-03-25 ENCOUNTER — Telehealth: Payer: Self-pay | Admitting: Internal Medicine

## 2014-03-25 NOTE — Telephone Encounter (Signed)
Relevant patient education assigned to patient using Emmi. ° °

## 2014-03-28 ENCOUNTER — Other Ambulatory Visit: Payer: 59

## 2014-04-04 ENCOUNTER — Telehealth: Payer: Self-pay

## 2014-04-04 NOTE — Telephone Encounter (Signed)
Patient called stating that he was seen two weeks ago and advised by MD that he would submit paperwork to remain out of work. Pt would also like an extended handicap placard as well. Placard pending

## 2014-04-04 NOTE — Progress Notes (Signed)
Subjective:  F/u LBP and OA  F/u elev BP,  dizziness, lightheadedness - resolved  F/u Coronary artery disease with history of CABG - in card rehab  Patient had no further episodes of chest heaviness.        HPI  His CP, neck pain and LBP are better F/u LBP: he saw Dr Annette Stable and had a CT myelogram a few months ago - no need for surgery (2013)   The patient presents for a follow-up of  Chronic PVD hypertension, chronic dyslipidemia, CAD. He is not smoking...  S/p R bypass 2014, s/p CABG - doing better  BP Readings from Last 3 Encounters:  03/24/14 142/82  03/13/14 120/60  02/24/14 152/66    Wt Readings from Last 3 Encounters:  03/24/14 178 lb 4 oz (80.854 kg)  03/13/14 178 lb 12.8 oz (81.103 kg)  02/24/14 179 lb (81.194 kg)      Review of Systems  Constitutional: Negative for appetite change and unexpected weight change.  HENT: Negative for nosebleeds and trouble swallowing.   Eyes: Negative for itching and visual disturbance.  Respiratory: Positive for shortness of breath. Negative for chest tightness.   Cardiovascular: Negative for palpitations and leg swelling.  Gastrointestinal: Negative for blood in stool and abdominal distention.  Genitourinary: Negative for frequency and hematuria.  Musculoskeletal: Positive for back pain and gait problem.  Skin: Negative for color change.  Neurological: Negative for dizziness, tremors and speech difficulty.  Psychiatric/Behavioral: Negative for suicidal ideas, confusion, sleep disturbance, dysphoric mood and agitation. The patient is not nervous/anxious.        Objective:   Physical Exam  Constitutional: He is oriented to person, place, and time. He appears well-developed. No distress.  HENT:  Mouth/Throat: Oropharynx is clear and moist.  Eyes: Conjunctivae are normal. Pupils are equal, round, and reactive to light.  Neck: Normal range of motion. No JVD present. No thyromegaly present.  Cardiovascular: Normal rate,  regular rhythm, normal heart sounds and intact distal pulses.  Exam reveals no gallop and no friction rub.   No murmur heard. Pulmonary/Chest: Effort normal and breath sounds normal. No respiratory distress. He has no wheezes. He has no rales. He exhibits no tenderness.  Abdominal: Soft. Bowel sounds are normal. He exhibits no distension and no mass. There is no tenderness. There is no rebound and no guarding.  Musculoskeletal: Normal range of motion. He exhibits tenderness (R buttock, R hip, R leg, foot). He exhibits no edema.  Lymphadenopathy:    He has no cervical adenopathy.  Neurological: He is alert and oriented to person, place, and time. He has normal reflexes. No cranial nerve deficit. He exhibits normal muscle tone. Coordination normal.  Skin: Skin is warm and dry. No rash noted.  Very tanned  Psychiatric: He has a normal mood and affect. His behavior is normal. Judgment and thought content normal.  chest and R groin scar moles  Lab Results  Component Value Date   WBC 8.9 01/07/2014   HGB 15.9 01/07/2014   HCT 44.1 01/07/2014   PLT 211 01/07/2014   GLUCOSE 109* 03/13/2014   CHOL 154 03/13/2014   TRIG 169.0* 03/13/2014   HDL 45.60 03/13/2014   LDLDIRECT 159.6 08/28/2012   LDLCALC 75 03/13/2014   ALT 51 03/13/2014   AST 38* 03/13/2014   NA 140 03/13/2014   K 3.3* 03/13/2014   CL 108 03/13/2014   CREATININE 0.9 03/13/2014   BUN 10 03/13/2014   CO2 24 03/13/2014   TSH 1.93 08/28/2012  PSA 0.65 08/28/2012   INR 1.00 08/27/2013   HGBA1C 5.8* 05/03/2013          Assessment & Plan:

## 2014-04-08 ENCOUNTER — Telehealth: Payer: Self-pay | Admitting: *Deleted

## 2014-04-08 NOTE — Telephone Encounter (Signed)
Pt called states at his last OV he was told the MD was going to write a work excuse letter.  Pt also states MD was to extend his Handicap Placard.  Please advise

## 2014-04-10 NOTE — Telephone Encounter (Signed)
Handicap placard form ready. Pt informed. Please advise on work restrictions letter.

## 2014-04-11 ENCOUNTER — Encounter: Payer: Self-pay | Admitting: Family

## 2014-04-14 ENCOUNTER — Encounter: Payer: Self-pay | Admitting: Family

## 2014-04-14 ENCOUNTER — Ambulatory Visit (INDEPENDENT_AMBULATORY_CARE_PROVIDER_SITE_OTHER)
Admission: RE | Admit: 2014-04-14 | Discharge: 2014-04-14 | Disposition: A | Discharge status: Home / Self Care | Payer: 59 | Source: Ambulatory Visit | Attending: Family | Admitting: Family

## 2014-04-14 ENCOUNTER — Ambulatory Visit (HOSPITAL_COMMUNITY)
Admission: RE | Admit: 2014-04-14 | Discharge: 2014-04-14 | Disposition: A | Discharge status: Home / Self Care | Payer: 59 | Source: Ambulatory Visit | Attending: Family | Admitting: Family

## 2014-04-14 ENCOUNTER — Ambulatory Visit (INDEPENDENT_AMBULATORY_CARE_PROVIDER_SITE_OTHER): Payer: 59 | Admitting: Family

## 2014-04-14 VITALS — BP 139/81 | HR 77 | Resp 16 | Ht 65.0 in | Wt 179.0 lb

## 2014-04-14 DIAGNOSIS — Z9889 Other specified postprocedural states: Secondary | ICD-10-CM | POA: Insufficient documentation

## 2014-04-14 DIAGNOSIS — Z48812 Encounter for surgical aftercare following surgery on the circulatory system: Secondary | ICD-10-CM | POA: Insufficient documentation

## 2014-04-14 DIAGNOSIS — Z87891 Personal history of nicotine dependence: Secondary | ICD-10-CM | POA: Insufficient documentation

## 2014-04-14 DIAGNOSIS — I70219 Atherosclerosis of native arteries of extremities with intermittent claudication, unspecified extremity: Secondary | ICD-10-CM | POA: Insufficient documentation

## 2014-04-14 DIAGNOSIS — I739 Peripheral vascular disease, unspecified: Secondary | ICD-10-CM

## 2014-04-14 DIAGNOSIS — I10 Essential (primary) hypertension: Secondary | ICD-10-CM | POA: Insufficient documentation

## 2014-04-14 DIAGNOSIS — E785 Hyperlipidemia, unspecified: Secondary | ICD-10-CM | POA: Insufficient documentation

## 2014-04-15 ENCOUNTER — Ambulatory Visit (INDEPENDENT_AMBULATORY_CARE_PROVIDER_SITE_OTHER): Payer: 59 | Admitting: Family

## 2014-04-15 ENCOUNTER — Telehealth: Payer: Self-pay | Admitting: Internal Medicine

## 2014-04-15 ENCOUNTER — Encounter: Payer: Self-pay | Admitting: Family

## 2014-04-15 DIAGNOSIS — Z48812 Encounter for surgical aftercare following surgery on the circulatory system: Secondary | ICD-10-CM

## 2014-04-15 DIAGNOSIS — I739 Peripheral vascular disease, unspecified: Secondary | ICD-10-CM

## 2014-04-15 NOTE — Patient Instructions (Signed)
Peripheral Vascular Disease Peripheral Vascular Disease (PVD), also called Peripheral Arterial Disease (PAD), is a circulation problem caused by cholesterol (atherosclerotic plaque) deposits in the arteries. PVD commonly occurs in the lower extremities (legs) but it can occur in other areas of the body, such as your arms. The cholesterol buildup in the arteries reduces blood flow which can cause pain and other serious problems. The presence of PVD can place a person at risk for Coronary Artery Disease (CAD).  CAUSES  Causes of PVD can be many. It is usually associated with more than one risk factor such as:   High Cholesterol.  Smoking.  Diabetes.  Lack of exercise or inactivity.  High blood pressure (hypertension).  Obesity.  Family history. SYMPTOMS   When the lower extremities are affected, patients with PVD may experience:  Leg pain with exertion or physical activity. This is called INTERMITTENT CLAUDICATION. This may present as cramping or numbness with physical activity. The location of the pain is associated with the level of blockage. For example, blockage at the abdominal level (distal abdominal aorta) may result in buttock or hip pain. Lower leg arterial blockage may result in calf pain.  As PVD becomes more severe, pain can develop with less physical activity.  In people with severe PVD, leg pain may occur at rest.  Other PVD signs and symptoms:  Leg numbness or weakness.  Coldness in the affected leg or foot, especially when compared to the other leg.  A change in leg color.  Patients with significant PVD are more prone to ulcers or sores on toes, feet or legs. These may take longer to heal or may reoccur. The ulcers or sores can become infected.  If signs and symptoms of PVD are ignored, gangrene may occur. This can result in the loss of toes or loss of an entire limb.  Not all leg pain is related to PVD. Other medical conditions can cause leg pain such  as:  Blood clots (embolism) or Deep Vein Thrombosis.  Inflammation of the blood vessels (vasculitis).  Spinal stenosis. DIAGNOSIS  Diagnosis of PVD can involve several different types of tests. These can include:  Pulse Volume Recording Method (PVR). This test is simple, painless and does not involve the use of X-rays. PVR involves measuring and comparing the blood pressure in the arms and legs. An ABI (Ankle-Brachial Index) is calculated. The normal ratio of blood pressures is 1. As this number becomes smaller, it indicates more severe disease.  < 0.95  indicates significant narrowing in one or more leg vessels.  <0.8 there will usually be pain in the foot, leg or buttock with exercise.  <0.4 will usually have pain in the legs at rest.  <0.25  usually indicates limb threatening PVD.  Doppler detection of pulses in the legs. This test is painless and checks to see if you have a pulses in your legs/feet.  A dye or contrast material (a substance that highlights the blood vessels so they show up on x-ray) may be given to help your caregiver better see the arteries for the following tests. The dye is eliminated from your body by the kidney's. Your caregiver may order blood work to check your kidney function and other laboratory values before the following tests are performed:  Magnetic Resonance Angiography (MRA). An MRA is a picture study of the blood vessels and arteries. The MRA machine uses a large magnet to produce images of the blood vessels.  Computed Tomography Angiography (CTA). A CTA is a   specialized x-ray that looks at how the blood flows in your blood vessels. An IV may be inserted into your arm so contrast dye can be injected.  Angiogram. Is a procedure that uses x-rays to look at your blood vessels. This procedure is minimally invasive, meaning a small incision (cut) is made in your groin. A small tube (catheter) is then inserted into the artery of your groin. The catheter is  guided to the blood vessel or artery your caregiver wants to examine. Contrast dye is injected into the catheter. X-rays are then taken of the blood vessel or artery. After the images are obtained, the catheter is taken out. TREATMENT  Treatment of PVD involves many interventions which may include:  Lifestyle changes:  Quitting smoking.  Exercise.  Following a low fat, low cholesterol diet.  Control of diabetes.  Foot care is very important to the PVD patient. Good foot care can help prevent infection.  Medication:  Cholesterol-lowering medicine.  Blood pressure medicine.  Anti-platelet drugs.  Certain medicines may reduce symptoms of Intermittent Claudication.  Interventional/Surgical options:  Angioplasty. An Angioplasty is a procedure that inflates a balloon in the blocked artery. This opens the blocked artery to improve blood flow.  Stent Implant. A wire mesh tube (stent) is placed in the artery. The stent expands and stays in place, allowing the artery to remain open.  Peripheral Bypass Surgery. This is a surgical procedure that reroutes the blood around a blocked artery to help improve blood flow. This type of procedure may be performed if Angioplasty or stent implants are not an option. SEEK IMMEDIATE MEDICAL CARE IF:   You develop pain or numbness in your arms or legs.  Your arm or leg turns cold, becomes blue in color.  You develop redness, warmth, swelling and pain in your arms or legs. MAKE SURE YOU:   Understand these instructions.  Will watch your condition.  Will get help right away if you are not doing well or get worse. Document Released: 12/22/2004 Document Revised: 02/06/2012 Document Reviewed: 11/18/2008 [employer] Patient Information 2014 [employer].  

## 2014-04-15 NOTE — Telephone Encounter (Signed)
Done. Thx.

## 2014-04-15 NOTE — Telephone Encounter (Signed)
04/15/2014 PT CAME BY TO PICK UP PAPERWORK AND STATES A LETTER WAS MISSING FROM HIS DOCUMENT REQUEST. PT STATES DR PLOTNIKOV WAS SUPPOSE TO PREPARE A LETTER ABOUT DISABILITY SO THAT THE PT DOES NOT HAVE TO RETURN TO WORK. PT WOULD LIKE TO BE CONTACTED AS SOON AS THIS REQUEST IS COMPLETED.

## 2014-04-15 NOTE — Progress Notes (Signed)
Lab only 
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
## 2014-04-16 NOTE — Telephone Encounter (Signed)
There is already a letter generated in media tab for the patient. It was generated yesterday.  Did he not pick this up?

## 2014-04-18 ENCOUNTER — Telehealth: Payer: Self-pay | Admitting: Surgery

## 2014-04-18 NOTE — Telephone Encounter (Addendum)
Message copied by Gena Fray on Fri Apr 18, 2014  4:16 PM ------      Message from: Viann Fish      Created: Fri Apr 18, 2014 12:42 PM      Regarding: FW: plan ok?       Please call pt and schedule him to return in 3 months instead of 6 months for ABI's and right LE arterial Duplex, and to see Dr. Trula Slade instead of me.      Thank you,      Vinnie Level            ----- Message -----         From: Serafina Mitchell, MD         Sent: 04/17/2014   9:34 PM           To: Sharmon Leyden Nickel, NP      Subject: RE: plan ok?                                             With velocities that high, I would have him return to see me in 3 months. Thanks      ----- Message -----         From: Viann Fish, NP         Sent: 04/15/2014   5:48 PM           To: Serafina Mitchell, MD      Subject: plan ok?                                                 Dr. Trula Slade,            ASSESSMENT:      Douglas Christensen is a 63 y.o. male who is status post right femoral-popliteal bypass graft on 12/28/2012 with saphenous vein. This was done for lifestyle limiting claudication. The patient reports a significant improvement in his right leg symptoms. He has known left common femoral stenosis.      He has cramping in the left calf after walking 6 minutes on the treadmill, relieved by rest, denies non healing wounds.      He walks a mile daily.      Fortunately he does not have diabetes and has no history of TIA or stroke.      His atherosclerotic risk factors are recent former smoker and significant CAD with CABG x 5 vessels in 2014.      Elevated velocities present suggestive of greater than 50% stenosis with calcific plaque present involving the left common femoral and proximal/mid/distal superficial femoral arteries.      Patent right femoral to popliteal artery bypass graft, no hemodynamically significant plaque or thrombus evident.      Stable ankle and toe brachial indices since study on 10/21/2013: normal in  the right leg, evidence of moderate arterial occlusive disease in the left leg which is now his symptomatic leg.      Based on the patient's vascular studies and examination, pt will return to clinic in 6 months for ABI's and right LE arterial Duplex.      He was counseled re smoking cessation.      Gradual walking  program.      He has a 411 velocity with biphasic waveforms in his left common femoral artery.      Is the above plan ok?            Thank you,      Vinnie Level             ------  04/18/14: spoke with pt, mailed pw, dpm

## 2014-05-09 ENCOUNTER — Telehealth: Payer: Self-pay | Admitting: Pulmonary Disease

## 2014-05-09 NOTE — Telephone Encounter (Signed)
Form is in VS look at. Please advise thanks

## 2014-05-09 NOTE — Telephone Encounter (Signed)
lmomtcb x1 for pt 

## 2014-05-09 NOTE — Telephone Encounter (Signed)
He needs to have ROV to assess compliance/benefit from therapy for OSA before form can be completed.  He can get scheduled with Tammy Parrett if I do not have ROV opening early enough for him.

## 2014-05-12 NOTE — Telephone Encounter (Signed)
appt set for Thursday with TP. Pt aware. Amaya Bing, CMA

## 2014-05-13 ENCOUNTER — Telehealth: Payer: Self-pay | Admitting: Cardiovascular Disease

## 2014-05-13 NOTE — Telephone Encounter (Signed)
Walk In pt Form " [employer]" Form Dropped Off gave to [facility]  6.16.15/km

## 2014-05-15 ENCOUNTER — Encounter: Payer: Self-pay | Admitting: Adult Health

## 2014-05-15 ENCOUNTER — Ambulatory Visit: Payer: 59 | Admitting: Adult Health

## 2014-05-15 VITALS — BP 128/64 | HR 65 | Temp 98.5°F | Ht 65.0 in | Wt 183.6 lb

## 2014-05-15 DIAGNOSIS — G4731 Primary central sleep apnea: Secondary | ICD-10-CM

## 2014-05-15 NOTE — Progress Notes (Signed)
   Subjective:    Patient ID: Douglas Christensen, male    DOB: [birth year], 63 y.o.   MRN: [id number]  HPI 63 year old male with known history of obstructive and central sleep apnea on BiPAP  Tests: HST 11/23/12 >> AHI 15.8, SpO2 low 83%, both obstructive and central events  05/15/2014 Followup. Sleep apnea Patient returns for followup of his sleep apnea, and DOT compliance, form. Compliance, report from May 20 2 June 18, shows 87%, compliance, over a 30 day period. Patient wore on average BiPAP for 4 hours each night  We discussed importance of daily. Use with a goal of trying the wears 6-8 hours each night. Patient denies any daytime sleepiness. Has rare naps. And does not feel sleepy driving. Patient denies any chest pain, palpitations, orthopnea, PND, syncope, or leg swelling.   Review of Systems Constitutional:   No  weight loss, night sweats,  Fevers, chills,  +fatigue, or  lassitude.  HEENT:   No headaches,  Difficulty swallowing,  Tooth/dental problems, or  Sore throat,                No sneezing, itching, ear ache, nasal congestion, post nasal drip,   CV:  No chest pain,  Orthopnea, PND, swelling in lower extremities, anasarca, dizziness, palpitations, syncope.   GI  No heartburn, indigestion, abdominal pain, nausea, vomiting, diarrhea, change in bowel habits, loss of appetite, bloody stools.   Resp:    No chest wall deformity  Skin: no rash or lesions.  GU: no dysuria, change in color of urine, no urgency or frequency.  No flank pain, no hematuria   MS:  No joint pain or swelling.  No decreased range of motion.  No back pain.  Psych:  No change in mood or affect. No depression or anxiety.  No memory loss.         Objective:   Physical Exam GEN: A/Ox3; pleasant , NAD, well nourished   HEENT:  [place]/AT,  EACs-clear, TMs-wnl, NOSE-clear, THROAT-clear, no lesions, no postnasal drip or exudate noted.   NECK:  Supple w/ fair ROM; no JVD; normal carotid impulses w/o bruits; no  thyromegaly or nodules palpated; no lymphadenopathy.  RESP  Clear  P & A; w/o, wheezes/ rales/ or rhonchi.no accessory muscle use, no dullness to percussion  CARD:  RRR, no m/r/g  , no peripheral edema, pulses intact, no cyanosis or clubbing.  GI:   Soft & nt; nml bowel sounds; no organomegaly or masses detected.  Musco: Warm bil, no deformities or joint swelling noted.   Neuro: alert, no focal deficits noted.    Skin: Warm, no lesions or rashes         Assessment & Plan:

## 2014-05-15 NOTE — Assessment & Plan Note (Signed)
Sleep apnea on nocturnal BIPAP  Compliance reviewed   Plan   Wear BIPAP every night , try to wear at least 6-8hrs  Do not drive if sleepy.  Follow up Dr. Halford Chessman  In 4 months and .As needed

## 2014-05-15 NOTE — Patient Instructions (Signed)
Wear BIPAP every night , try to wear at least 6-8hrs  Do not drive if sleepy.  Follow up Dr. Halford Chessman  In 4 months and .As needed

## 2014-05-16 ENCOUNTER — Telehealth: Payer: Self-pay | Admitting: *Deleted

## 2014-05-16 ENCOUNTER — Telehealth: Payer: Self-pay | Admitting: Internal Medicine

## 2014-05-16 NOTE — Telephone Encounter (Signed)
lvm for pt to pick up paperwork will route to Theodosia Quay, RN

## 2014-05-16 NOTE — Telephone Encounter (Signed)
Spoke with pt on 05/15/2014. Pt states that he did not receive the letter but disability was submitted and approved successfully with the documents he had previously received from Dr Alain Marion. Pt states letter is no longer needed.

## 2014-05-16 NOTE — Progress Notes (Signed)
Reviewed and agree with assessment/plan. 

## 2014-05-20 NOTE — Telephone Encounter (Signed)
Needs a letter

## 2014-06-05 ENCOUNTER — Telehealth: Payer: Self-pay | Admitting: *Deleted

## 2014-06-05 NOTE — Telephone Encounter (Signed)
Left detailed mess informing pt his Physician clearance form is ready for p/u. Sent copy of form to be scanned.
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
## 2014-06-11 IMAGING — CR DG LUMBAR SPINE COMPLETE 4+V
5 series · 5 of 5 positions shown · non-contrast
Comparison: CT 06/20/2012

CLINICAL DATA: Low back pain.

LUMBAR SPINE - COMPLETE 4+ VIEW

[view not recorded (1 of 5)]
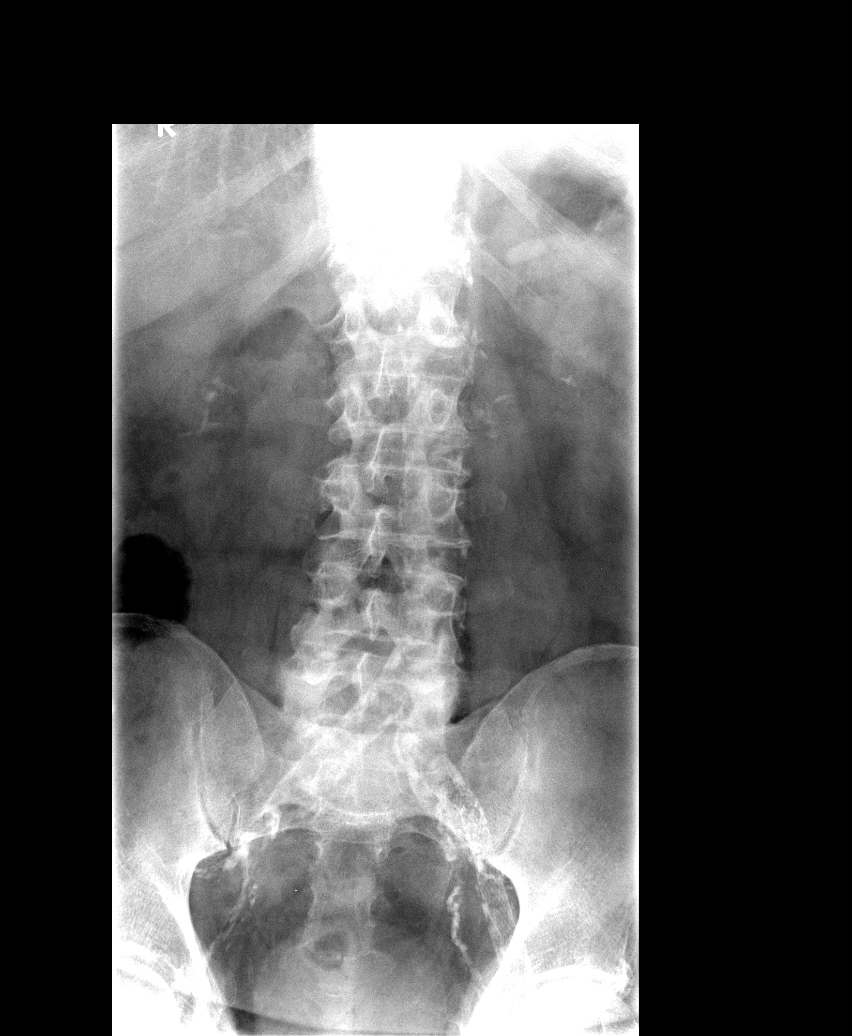

[view not recorded (2 of 5)]
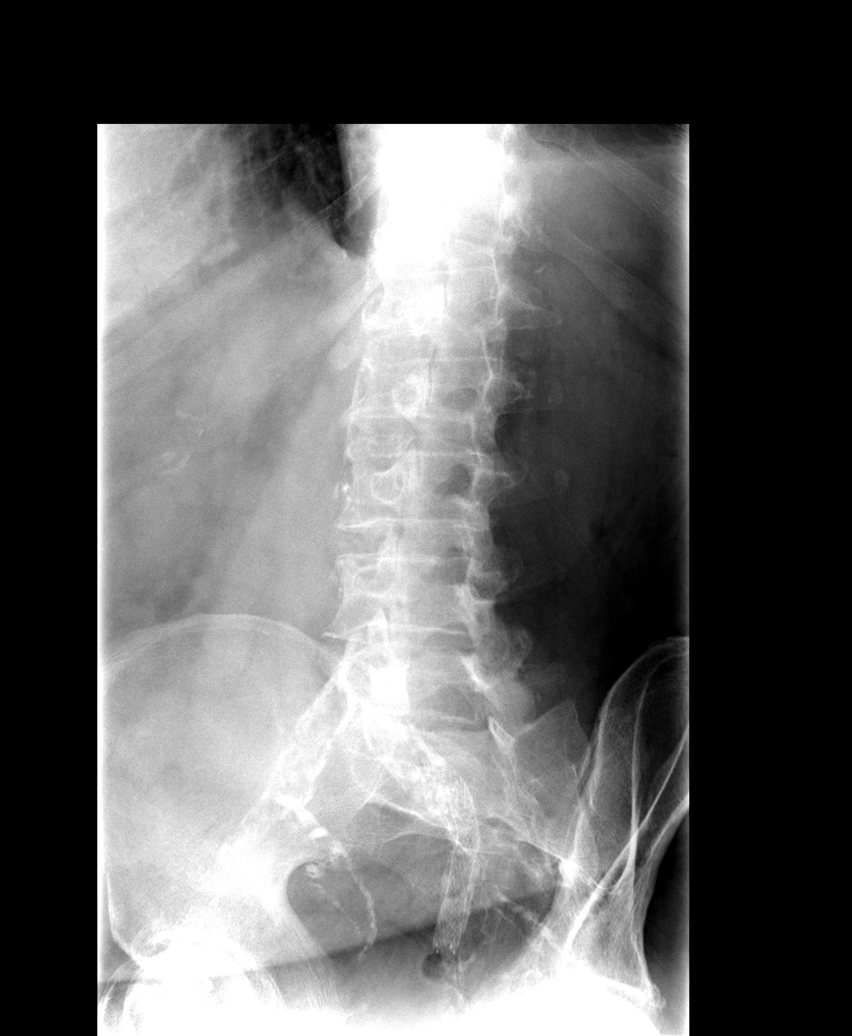

[view not recorded (3 of 5)]
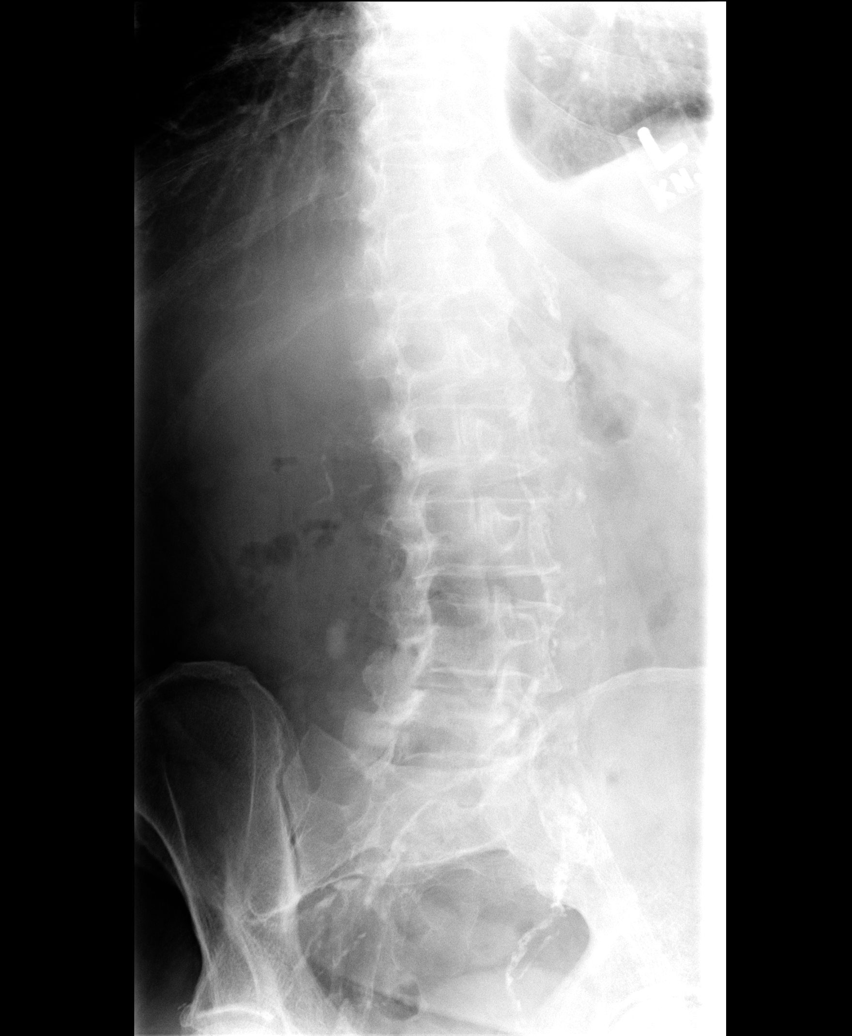

[view not recorded (4 of 5)]
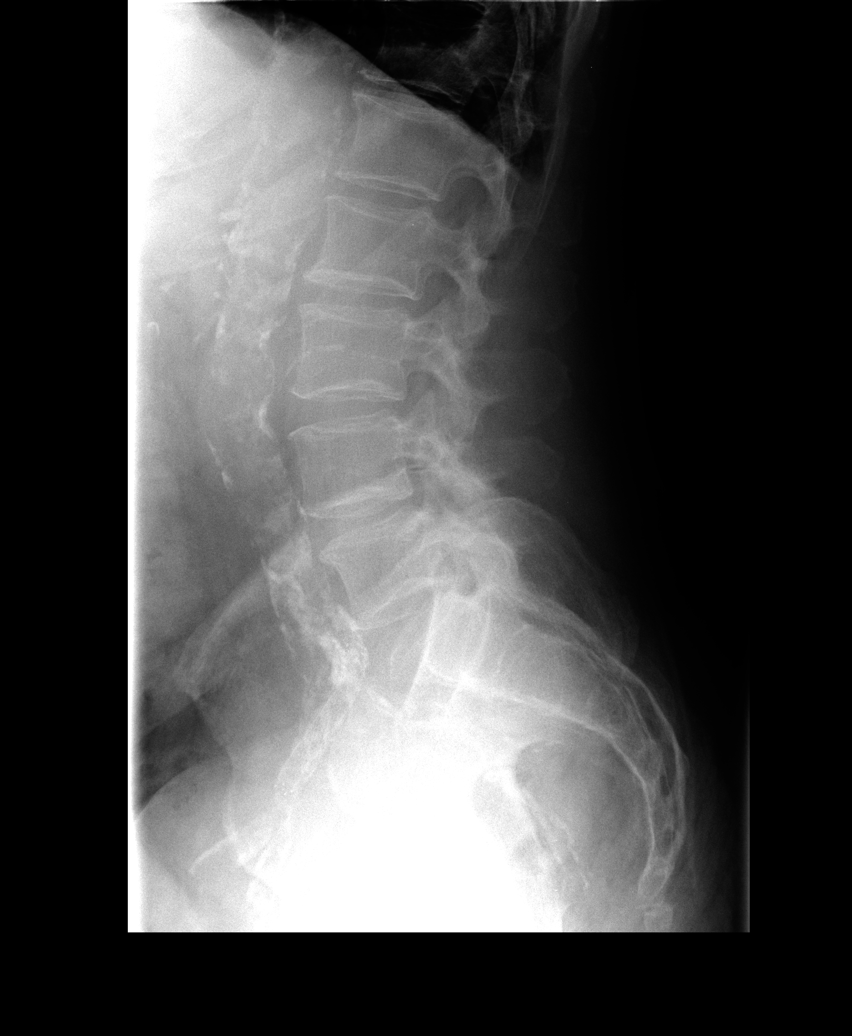

[view not recorded (5 of 5)]
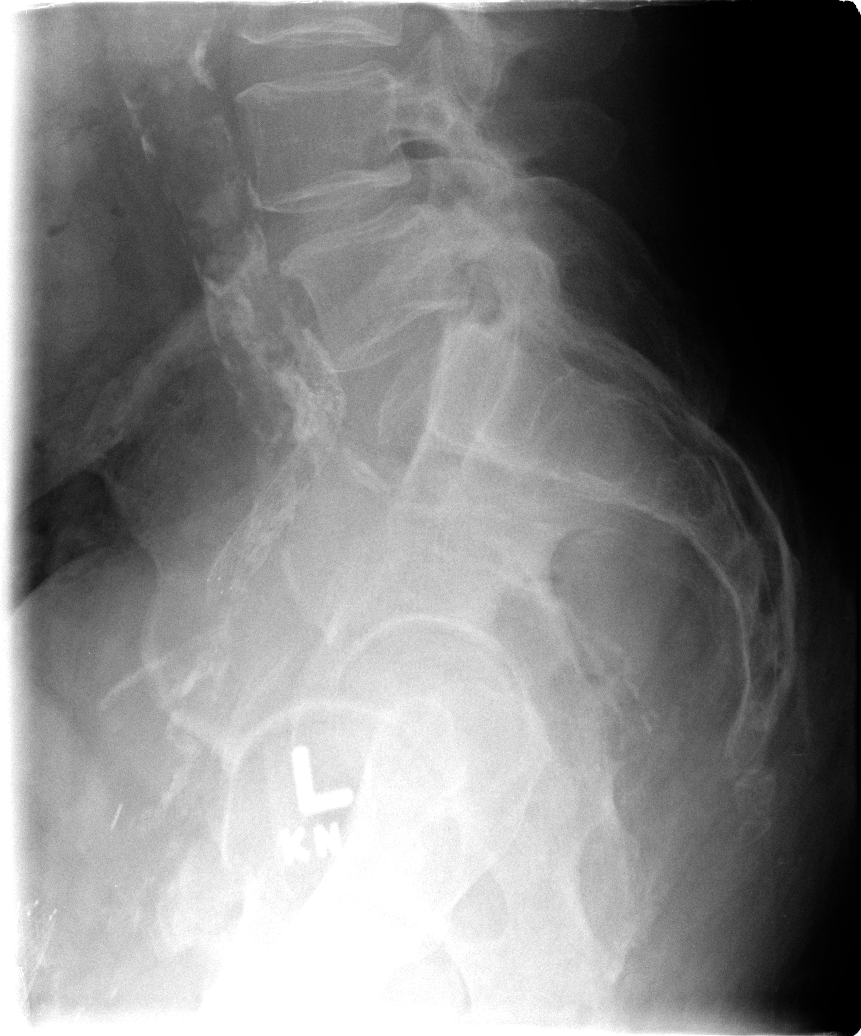

[5 of 5 positions shown; findings below may reference images not displayed]

FINDINGS: Alignment is normal.  Disc spaces are maintained.  No
fracture.  No subluxation.  SI joints are symmetric and
unremarkable.

Dense vascular calcifications in the aorta and iliac vessels.
Stent within the left external iliac artery.
IMPRESSION: No acute bony abnormality.

## 2014-06-13 ENCOUNTER — Other Ambulatory Visit: Payer: Self-pay | Admitting: Internal Medicine

## 2014-06-29 ENCOUNTER — Emergency Department (HOSPITAL_COMMUNITY): Payer: BC Managed Care – PPO

## 2014-06-29 DIAGNOSIS — Z87891 Personal history of nicotine dependence: Secondary | ICD-10-CM | POA: Insufficient documentation

## 2014-06-29 DIAGNOSIS — Z9889 Other specified postprocedural states: Secondary | ICD-10-CM | POA: Insufficient documentation

## 2014-06-29 DIAGNOSIS — Z79899 Other long term (current) drug therapy: Secondary | ICD-10-CM | POA: Insufficient documentation

## 2014-06-29 DIAGNOSIS — Z8719 Personal history of other diseases of the digestive system: Secondary | ICD-10-CM | POA: Insufficient documentation

## 2014-06-29 DIAGNOSIS — Z860100 Personal history of colon polyps, unspecified: Secondary | ICD-10-CM | POA: Insufficient documentation

## 2014-06-29 DIAGNOSIS — E785 Hyperlipidemia, unspecified: Secondary | ICD-10-CM | POA: Insufficient documentation

## 2014-06-29 DIAGNOSIS — Z7982 Long term (current) use of aspirin: Secondary | ICD-10-CM | POA: Insufficient documentation

## 2014-06-29 DIAGNOSIS — R109 Unspecified abdominal pain: Secondary | ICD-10-CM | POA: Insufficient documentation

## 2014-06-29 DIAGNOSIS — M545 Low back pain, unspecified: Secondary | ICD-10-CM | POA: Insufficient documentation

## 2014-06-29 DIAGNOSIS — J4489 Other specified chronic obstructive pulmonary disease: Secondary | ICD-10-CM | POA: Insufficient documentation

## 2014-06-29 DIAGNOSIS — N390 Urinary tract infection, site not specified: Secondary | ICD-10-CM | POA: Insufficient documentation

## 2014-06-29 DIAGNOSIS — I2510 Atherosclerotic heart disease of native coronary artery without angina pectoris: Secondary | ICD-10-CM | POA: Insufficient documentation

## 2014-06-29 DIAGNOSIS — J449 Chronic obstructive pulmonary disease, unspecified: Secondary | ICD-10-CM | POA: Insufficient documentation

## 2014-06-29 DIAGNOSIS — R110 Nausea: Secondary | ICD-10-CM | POA: Insufficient documentation

## 2014-06-29 DIAGNOSIS — IMO0002 Reserved for concepts with insufficient information to code with codable children: Secondary | ICD-10-CM | POA: Insufficient documentation

## 2014-06-29 DIAGNOSIS — E559 Vitamin D deficiency, unspecified: Secondary | ICD-10-CM | POA: Insufficient documentation

## 2014-06-29 DIAGNOSIS — Z9861 Coronary angioplasty status: Secondary | ICD-10-CM | POA: Insufficient documentation

## 2014-06-29 DIAGNOSIS — I10 Essential (primary) hypertension: Secondary | ICD-10-CM | POA: Insufficient documentation

## 2014-06-29 LAB — CBC WITH DIFFERENTIAL/PLATELET
BASOS PCT: 0 % (ref 0–1)
Basophils Absolute: 0.1 10*3/uL (ref 0.0–0.1)
EOS ABS: 0.2 10*3/uL (ref 0.0–0.7)
Eosinophils Relative: 2 % (ref 0–5)
HCT: 43.6 % (ref 39.0–52.0)
Hemoglobin: 15.6 g/dL (ref 13.0–17.0)
LYMPHS ABS: 2.6 10*3/uL (ref 0.7–4.0)
Lymphocytes Relative: 19 % (ref 12–46)
MCH: 34.3 pg — AB (ref 26.0–34.0)
MCHC: 35.8 g/dL (ref 30.0–36.0)
MCV: 95.8 fL (ref 78.0–100.0)
MONO ABS: 1.2 10*3/uL — AB (ref 0.1–1.0)
Monocytes Relative: 9 % (ref 3–12)
NEUTROS PCT: 70 % (ref 43–77)
Neutro Abs: 9.6 10*3/uL — ABNORMAL HIGH (ref 1.7–7.7)
Platelets: 251 10*3/uL (ref 150–400)
RBC: 4.55 MIL/uL (ref 4.22–5.81)
RDW: 13.2 % (ref 11.5–15.5)
WBC: 13.7 10*3/uL — ABNORMAL HIGH (ref 4.0–10.5)

## 2014-06-29 LAB — BASIC METABOLIC PANEL
Anion gap: 17 — ABNORMAL HIGH (ref 5–15)
BUN: 15 mg/dL (ref 6–23)
CO2: 21 mEq/L (ref 19–32)
CREATININE: 0.91 mg/dL (ref 0.50–1.35)
Calcium: 9.4 mg/dL (ref 8.4–10.5)
Chloride: 100 mEq/L (ref 96–112)
GFR calc non Af Amer: 88 mL/min — ABNORMAL LOW (ref >90)
GLUCOSE: 103 mg/dL — AB (ref 70–99)
Potassium: 4.3 mEq/L (ref 3.7–5.3)
Sodium: 138 mEq/L (ref 137–147)

## 2014-06-29 LAB — URINALYSIS W MICROSCOPIC (NOT AT ARMC)
GLUCOSE, UA: NEGATIVE mg/dL
HGB URINE DIPSTICK: NEGATIVE
KETONES UR: 40 mg/dL — AB
Nitrite: POSITIVE — AB
Protein, ur: NEGATIVE mg/dL
Specific Gravity, Urine: 1.018 (ref 1.005–1.030)
UROBILINOGEN UA: 4 mg/dL — AB (ref 0.0–1.0)
pH: 5 (ref 5.0–8.0)

## 2014-06-29 MED ORDER — OXYCODONE-ACETAMINOPHEN 5-325 MG PO TABS
1.0000 | ORAL_TABLET | Freq: Once | ORAL | Status: AC
  Administered 2014-06-29: 1 via ORAL
  Filled 2014-06-29: qty 1

## 2014-06-29 MED ORDER — IBUPROFEN 200 MG PO TABS
400.0000 mg | ORAL_TABLET | Freq: Once | ORAL | Status: AC
  Administered 2014-06-29: 400 mg via ORAL
  Filled 2014-06-29: qty 2

## 2014-06-29 MED ORDER — ONDANSETRON 4 MG PO TBDP
8.0000 mg | ORAL_TABLET | Freq: Once | ORAL | Status: AC
  Administered 2014-06-29: 8 mg via ORAL
  Filled 2014-06-29: qty 2

## 2014-06-29 NOTE — ED Notes (Addendum)
C/o L flank low back pain, mentions "difficulty breathing", sob and nausea. Gradually progressively worse. (denies: fever,vd or urinary sx). Tried AZO w/o relief.  LS CTA.

## 2014-06-30 ENCOUNTER — Emergency Department (HOSPITAL_COMMUNITY)
Admission: EM | Admit: 2014-06-30 | Discharge: 2014-06-30 | Disposition: A | Discharge status: Home / Self Care | Payer: BC Managed Care – PPO | Attending: Emergency Medicine | Admitting: Emergency Medicine

## 2014-06-30 ENCOUNTER — Encounter (HOSPITAL_COMMUNITY): Payer: Self-pay | Admitting: *Deleted

## 2014-06-30 ENCOUNTER — Emergency Department (HOSPITAL_COMMUNITY): Payer: BC Managed Care – PPO

## 2014-06-30 DIAGNOSIS — N390 Urinary tract infection, site not specified: Secondary | ICD-10-CM

## 2014-06-30 DIAGNOSIS — R109 Unspecified abdominal pain: Secondary | ICD-10-CM

## 2014-06-30 MED ORDER — ONDANSETRON HCL 4 MG/2ML IJ SOLN
4.0000 mg | Freq: Once | INTRAMUSCULAR | Status: AC
  Administered 2014-06-30: 4 mg via INTRAVENOUS
  Filled 2014-06-30: qty 2

## 2014-06-30 MED ORDER — OXYCODONE-ACETAMINOPHEN 5-325 MG PO TABS: 2.0000 | ORAL_TABLET | ORAL | Status: DC | PRN

## 2014-06-30 MED ORDER — CIPROFLOXACIN HCL 500 MG PO TABS
500.0000 mg | ORAL_TABLET | Freq: Once | ORAL | Status: AC
  Administered 2014-06-30: 500 mg via ORAL
  Filled 2014-06-30: qty 1

## 2014-06-30 MED ORDER — CIPROFLOXACIN HCL 500 MG PO TABS: 500.0000 mg | ORAL_TABLET | Freq: Two times a day (BID) | ORAL | Status: DC

## 2014-06-30 MED ORDER — HYDROMORPHONE HCL PF 1 MG/ML IJ SOLN
1.0000 mg | Freq: Once | INTRAMUSCULAR | Status: AC
  Administered 2014-06-30: 1 mg via INTRAVENOUS
  Filled 2014-06-30: qty 1

## 2014-06-30 NOTE — ED Notes (Signed)
Patient transported to CT 

## 2014-06-30 NOTE — Discharge Instructions (Signed)
Please take medications as prescribed.  Followup with your doctor for recheck in 2-3 days.  Return emergency room for worsening pain, fevers, nausea, vomiting or other new concerning symptoms.   Flank Pain Flank pain refers to pain that is located on the side of the body between the upper abdomen and the back. The pain may occur over a short period of time (acute) or may be long-term or reoccurring (chronic). It may be mild or severe. Flank pain can be caused by many things. CAUSES  Some of the more common causes of flank pain include:  Muscle strains.   Muscle spasms.   A disease of your spine (vertebral disk disease).   A lung infection (pneumonia).   Fluid around your lungs (pulmonary edema).   A kidney infection.   Kidney stones.   A very painful skin rash caused by the chickenpox virus (shingles).   Gallbladder disease.  [place] care will depend on the cause of your pain. In general,  Rest as directed by your caregiver.  Drink enough fluids to keep your urine clear or pale yellow.  Only take over-the-counter or prescription medicines as directed by your caregiver. Some medicines may help relieve the pain.  Tell your caregiver about any changes in your pain.  Follow up with your caregiver as directed. SEEK IMMEDIATE MEDICAL CARE IF:   Your pain is not controlled with medicine.   You have new or worsening symptoms.  Your pain increases.   You have abdominal pain.   You have shortness of breath.   You have persistent nausea or vomiting.   You have swelling in your abdomen.   You feel faint or pass out.   You have blood in your urine.  You have a fever or persistent symptoms for more than 2-3 days.  You have a fever and your symptoms suddenly get worse. MAKE SURE YOU:   Understand these instructions.  Will watch your condition.  Will get help right away if you are not doing well or get worse. Document Released:  01/05/2006 Document Revised: 08/08/2012 Document Reviewed: 06/28/2012 [facility] Patient Information 2015 [place], [place]. This information is not intended to replace advice given to you by your health care provider. Make sure you discuss any questions you have with your health care provider.  Urinary Tract Infection Urinary tract infections (UTIs) can develop anywhere along your urinary tract. Your urinary tract is your body's drainage system for removing wastes and extra water. Your urinary tract includes two kidneys, two ureters, a bladder, and a urethra. Your kidneys are a pair of bean-shaped organs. Each kidney is about the size of your fist. They are located below your ribs, one on each side of your spine. CAUSES Infections are caused by microbes, which are microscopic organisms, including fungi, viruses, and bacteria. These organisms are so small that they can only be seen through a microscope. Bacteria are the microbes that most commonly cause UTIs. SYMPTOMS  Symptoms of UTIs may vary by age and gender of the patient and by the location of the infection. Symptoms in young women typically include a frequent and intense urge to urinate and a painful, burning feeling in the bladder or urethra during urination. Older women and men are more likely to be tired, shaky, and weak and have muscle aches and abdominal pain. A fever may mean the infection is in your kidneys. Other symptoms of a kidney infection include pain in your back or sides below the ribs, nausea,  and vomiting. DIAGNOSIS To diagnose a UTI, your caregiver will ask you about your symptoms. Your caregiver also will ask to provide a urine sample. The urine sample will be tested for bacteria and white blood cells. White blood cells are made by your body to help fight infection. TREATMENT  Typically, UTIs can be treated with medication. Because most UTIs are caused by a bacterial infection, they usually can be treated with the use of  antibiotics. The choice of antibiotic and length of treatment depend on your symptoms and the type of bacteria causing your infection. HOME CARE INSTRUCTIONS  If you were prescribed antibiotics, take them exactly as your caregiver instructs you. Finish the medication even if you feel better after you have only taken some of the medication.  Drink enough water and fluids to keep your urine clear or pale yellow.  Avoid caffeine, tea, and carbonated beverages. They tend to irritate your bladder.  Empty your bladder often. Avoid holding urine for long periods of time.  Empty your bladder before and after sexual intercourse.  After a bowel movement, women should cleanse from front to back. Use each tissue only once. SEEK MEDICAL CARE IF:   You have back pain.  You develop a fever.  Your symptoms do not begin to resolve within 3 days. SEEK IMMEDIATE MEDICAL CARE IF:   You have severe back pain or lower abdominal pain.  You develop chills.  You have nausea or vomiting.  You have continued burning or discomfort with urination. MAKE SURE YOU:   Understand these instructions.  Will watch your condition.  Will get help right away if you are not doing well or get worse. Document Released: 08/24/2005 Document Revised: 05/15/2012 Document Reviewed: 12/23/2011 [facility] Patient Information 2015 [place], [place]. This information is not intended to replace advice given to you by your health care provider. Make sure you discuss any questions you have with your health care provider.

## 2014-06-30 NOTE — ED Provider Notes (Signed)
CSN: [id number]     Arrival date & time 06/29/14  1943 History   First MD Initiated Contact with Patient 06/30/14 0019     Chief Complaint  Patient presents with  . Back Pain  . Flank Pain  . Nausea     (Consider location/radiation/quality/duration/timing/severity/associated sxs/prior Treatment) HPI 63 year old male presents to emergency room with complaint of left flank pain.  Pain started this morning and has worsened throughout the day.  Patient reports that he had difficulties getting comfortable earlier.  He denies any fever chills, vomiting or diarrhea.  Patient has had nausea.  He denies any dysuria.  No prior history of kidney stone.  He reports that the pain is worse with any deep breath at times.  He feels like his abdomen slightly more distended than usual. Past Medical History  Diagnosis Date  . History of colonic polyps   . COPD (chronic obstructive pulmonary disease)   . CAD (coronary artery disease)     Mild plaque (cath "years ago"); abnormal Myoview 04/2013 with subsequent CABG x 5 with LIMA to LAD, SVG to OM1, SVG to DX, SVG to PD & PL.   Marland Kitchen GERD (gastroesophageal reflux disease)   . Hyperlipidemia   . Hypertension   . Vitamin D deficiency   . Meralgia paresthetica of left side 2011  . LBP (low back pain)   . PVD (peripheral vascular disease)     Stent to left common femoral and right superficial femoral.  2008.  50%  left renal   . Sleep apnea     mod OSA, central sleep apnea/hypoapnea syndrome 11/22/12  . Shortness of breath     "once in awhile; can happen at anytime" (08/26/2013)   Past Surgical History  Procedure Laterality Date  . Lower extremity stents      bilateral lower extremities x 2  . Femoral-popliteal bypass graft  12/27/2012    Procedure: BYPASS GRAFT FEMORAL-POPLITEAL ARTERY;  Surgeon: Serafina Mitchell, MD;  Location: [facility];  Service: Vascular;  Laterality: Right;  using non-reversed sapphenous vein.  . Cardiac catheterization  05/02/13    x2   .  Coronary artery bypass graft N/A 05/06/2013    Procedure: CORONARY ARTERY BYPASS GRAFTING (CABG);  Surgeon: Melrose Nakayama, MD;  Location: [facility];  Service: Open Heart Surgery;  Laterality: N/A;  Coronary artery bypass graft times five using left internal mammary artery and left greater saphenous vein via endovein harvest.   Family History  Problem Relation Age of Onset  . Heart disease Mother     No clear CAD and Heart Disease before age 39  . Hypertension Mother   . Cancer Mother     ? colon ca  . Hyperlipidemia Mother   . Cancer Father     brain tumor  . Alzheimer's disease Father   . Colon cancer Neg Hx    History  Substance Use Topics  . Smoking status: Former Smoker -- 2.00 packs/day for 47 years    Types: Cigarettes    Quit date: 12/17/2012  . Smokeless tobacco: Never Used  . Alcohol Use: 2.4 oz/week    4 Shots of liquor per week     Comment: 08/26/2013 "3-4 mixed drinks/wk"    Review of Systems   See History of Present Illness; otherwise all other systems are reviewed and negative  Allergies  Benazepril  Home Medications   Prior to Admission medications   Medication Sig Start Date End Date Taking? Authorizing Provider  amLODipine (NORVASC)  10 MG tablet Take 0.5 tablets (5 mg total) by mouth daily. 11/26/13  Yes Aleksei Plotnikov V, MD  aspirin 325 MG EC tablet Take 325 mg by mouth daily.   Yes Historical Provider, MD  atenolol (TENORMIN) 50 MG tablet Take 50 mg by mouth at bedtime.   Yes Historical Provider, MD  atorvastatin (LIPITOR) 20 MG tablet Take 20 mg by mouth daily at 6 PM.   Yes Historical Provider, MD  cholecalciferol (VITAMIN D) 1000 UNITS tablet Take 1,000 Units by mouth daily.     Yes Historical Provider, MD  hydrALAZINE (APRESOLINE) 50 MG tablet Take 1 tablet (50 mg total) by mouth 3 (three) times daily. 11/26/13  Yes Aleksei Plotnikov V, MD  potassium chloride SA (K-DUR,KLOR-CON) 20 MEQ tablet Take 1 tablet (20 mEq total) by mouth 2 (two) times  daily. 03/14/14  Yes [name], NP  vitamin B-12 (CYANOCOBALAMIN) 1000 MCG tablet Take 1,000 mcg by mouth daily.   Yes Historical Provider, MD  sildenafil (VIAGRA) 100 MG tablet Take 100 mg by mouth daily as needed. For erectile dysfunction 11/02/12   Lew Dawes V, MD   BP 123/83  Pulse 69  Temp(Src) 97.7 F (36.5 C) (Oral)  Resp 17  Ht 5\' 5"  (1.651 m)  Wt 180 lb (81.647 kg)  BMI 29.95 kg/m2  SpO2 93% Physical Exam  Nursing note and vitals reviewed. Constitutional: He is oriented to person, place, and time. He appears well-developed and well-nourished.  HENT:  Head: Normocephalic and atraumatic.  Nose: Nose normal.  Mouth/Throat: Oropharynx is clear and moist.  Eyes: Conjunctivae and EOM are normal. Pupils are equal, round, and reactive to light.  Neck: Normal range of motion. Neck supple. No JVD present. No tracheal deviation present. No thyromegaly present.  Cardiovascular: Normal rate, regular rhythm, normal heart sounds and intact distal pulses.  Exam reveals no gallop and no friction rub.   No murmur heard. Pulmonary/Chest: Effort normal and breath sounds normal. No stridor. No respiratory distress. He has no wheezes. He has no rales. He exhibits no tenderness.  Abdominal: Soft. Bowel sounds are normal. He exhibits no distension and no mass. There is no tenderness. There is no rebound and no guarding.  Musculoskeletal: Normal range of motion. He exhibits tenderness (patient has tenderness to palpation to left upper back just below the costal margin.  He has positive CVA tenderness.). He exhibits no edema.  Lymphadenopathy:    He has no cervical adenopathy.  Neurological: He is alert and oriented to person, place, and time. No cranial nerve deficit. He exhibits normal muscle tone. Coordination normal.  Skin: Skin is warm and dry. No rash noted. No erythema. No pallor.  Psychiatric: He has a normal mood and affect. His behavior is normal. Judgment and thought content  normal.    ED Course  Procedures (including critical care time) Labs Review Labs Reviewed  BASIC METABOLIC PANEL - Abnormal; Notable for the following:    Glucose, Bld 103 (*)    GFR calc non Af Amer 88 (*)    Anion gap 17 (*)    All other components within normal limits  CBC WITH DIFFERENTIAL - Abnormal; Notable for the following:    WBC 13.7 (*)    MCH 34.3 (*)    Neutro Abs 9.6 (*)    Monocytes Absolute 1.2 (*)    All other components within normal limits  URINALYSIS W MICROSCOPIC - Abnormal; Notable for the following:    Color, Urine RED (*)  APPearance CLOUDY (*)    Bilirubin Urine MODERATE (*)    Ketones, ur 40 (*)    Urobilinogen, UA 4.0 (*)    Nitrite POSITIVE (*)    Leukocytes, UA MODERATE (*)    Casts GRANULAR CAST (*)    All other components within normal limits  URINE CULTURE    Imaging Review Ct Abdomen Pelvis Wo Contrast  06/30/2014   CLINICAL DATA:  Left flank pain  EXAM: CT ABDOMEN AND PELVIS WITHOUT CONTRAST  TECHNIQUE: Multidetector CT imaging of the abdomen and pelvis was performed following the standard protocol without IV contrast.  COMPARISON:  CT abdomen pelvis 06/20/2012  FINDINGS: Negative for renal obstruction. No ureteral stone. Bladder wall mildly thickened. No bladder calculus. Extensive renal artery calcification bilaterally. 4 cm right renal cyst is unchanged.  Lung bases are clear.  Coronary artery calcification.  Liver gallbladder and bile ducts are normal. Pancreas and spleen are normal.  Negative for bowel obstruction or bowel thickening. Sigmoid diverticulosis. Appendix is normal. No free fluid. No mass or adenopathy.  Umbilical hernia containing abdominal fat. No acute bony abnormality. Diffuse atherosclerotic disease in the aorta and iliac arteries which are heavily calcified but without aneurysm. Bilateral inguinal hernias containing fat.  IMPRESSION: No acute abnormality.  No renal obstruction.  Normal appendix.   Electronically Signed   By:  Franchot Gallo M.D.   On: 06/30/2014 01:57   Dg Chest 2 View  06/29/2014   CLINICAL DATA:  Back pain and nausea  EXAM: CHEST  2 VIEW  COMPARISON:  08/26/2013  FINDINGS: Stable mild cardiomegaly and aortic tortuosity. Status post CABG. Coarse mitral annular calcification again noted.  There is no edema, consolidation, effusion, or pneumothorax.  IMPRESSION: No active cardiopulmonary disease.   Electronically Signed   By: Jorje Guild M.D.   On: 06/29/2014 21:59     EKG Interpretation None      MDM   Final diagnoses:  Left flank pain  UTI (lower urinary tract infection)    63 year old male with left flank pain, red urine with positive nitrates and leukocyte esterase.  Maybe kidney stone versus pyelonephritis versus musculoskeletal, plan for CT abdomen pelvis without contrast.    Kalman Drape, MD 06/30/14 [phone]

## 2014-06-30 NOTE — ED Notes (Signed)
MD Otter at bedside.

## 2014-07-01 LAB — URINE CULTURE
COLONY COUNT: NO GROWTH
Culture: NO GROWTH

## 2014-07-07 DIAGNOSIS — Z0279 Encounter for issue of other medical certificate: Secondary | ICD-10-CM

## 2014-07-10 ENCOUNTER — Telehealth: Payer: Self-pay | Admitting: Internal Medicine

## 2014-07-10 NOTE — Telephone Encounter (Signed)
Filled out and placed in providers folder, waiting to receive back from provider.

## 2014-07-10 NOTE — Telephone Encounter (Signed)
Patient wanted to follow up on DMV papers that he dropped off for Dr. Alain Marion to sign.  Patient states the forms have to be turned in by tomorrow.  Thanks!

## 2014-07-11 NOTE — Telephone Encounter (Signed)
Patient came by to pick up forms. Copy sent to san/charge.

## 2014-07-18 ENCOUNTER — Ambulatory Visit: Payer: 59 | Admitting: Cardiovascular Disease

## 2014-07-18 ENCOUNTER — Encounter: Payer: Self-pay | Admitting: Surgery

## 2014-07-21 ENCOUNTER — Ambulatory Visit (INDEPENDENT_AMBULATORY_CARE_PROVIDER_SITE_OTHER): Payer: BC Managed Care – PPO | Admitting: Surgery

## 2014-07-21 ENCOUNTER — Ambulatory Visit (INDEPENDENT_AMBULATORY_CARE_PROVIDER_SITE_OTHER)
Admission: RE | Admit: 2014-07-21 | Discharge: 2014-07-21 | Disposition: A | Discharge status: Home / Self Care | Payer: BC Managed Care – PPO | Source: Ambulatory Visit | Attending: Family | Admitting: Family

## 2014-07-21 ENCOUNTER — Ambulatory Visit (HOSPITAL_COMMUNITY)
Admission: RE | Admit: 2014-07-21 | Discharge: 2014-07-21 | Disposition: A | Discharge status: Home / Self Care | Payer: BC Managed Care – PPO | Source: Ambulatory Visit | Attending: Family | Admitting: Family

## 2014-07-21 ENCOUNTER — Encounter: Payer: Self-pay | Admitting: Surgery

## 2014-07-21 VITALS — BP 129/68 | HR 60 | Temp 98.4°F | Resp 16 | Ht 65.0 in | Wt 184.0 lb

## 2014-07-21 DIAGNOSIS — Z48812 Encounter for surgical aftercare following surgery on the circulatory system: Secondary | ICD-10-CM | POA: Diagnosis present

## 2014-07-21 DIAGNOSIS — I739 Peripheral vascular disease, unspecified: Secondary | ICD-10-CM | POA: Diagnosis not present

## 2014-07-21 NOTE — Progress Notes (Signed)
HISTORY AND PHYSICAL     CC:  F/u for right femoral to popliteal bypass grafting 12/10/12  Plotnikov, Evie Lacks, MD  HPI: This is a 63 y.o. male who underwent right femoral to popliteal bypass grafting 12/10/12 and has done well from that standpoint.  He states that he has some swelling at times, but this does improve.  His complaints today are claudication in his left leg.  He walks on the treadmill 10-15 minutes today, but is limited by his left leg pain.  He states that this is the reason he can't walk any longer than 10-15 minutes.  He states that it is still not as bad as the right leg was.    He is on a statin for his cholesterol.  He is on a beta blocker and CCB for his HTN.     Past Medical History  Diagnosis Date  . History of colonic polyps   . COPD (chronic obstructive pulmonary disease)   . CAD (coronary artery disease)     Mild plaque (cath "years ago"); abnormal Myoview 04/2013 with subsequent CABG x 5 with LIMA to LAD, SVG to OM1, SVG to DX, SVG to PD & PL.   Marland Kitchen GERD (gastroesophageal reflux disease)   . Hyperlipidemia   . Hypertension   . Vitamin D deficiency   . Meralgia paresthetica of left side 2011  . LBP (low back pain)   . PVD (peripheral vascular disease)     Stent to left common femoral and right superficial femoral.  2008.  50%  left renal   . Sleep apnea     mod OSA, central sleep apnea/hypoapnea syndrome 11/22/12  . Shortness of breath     "once in awhile; can happen at anytime" (08/26/2013)   Past Surgical History  Procedure Laterality Date  . Lower extremity stents      bilateral lower extremities x 2  . Femoral-popliteal bypass graft  12/27/2012    Procedure: BYPASS GRAFT FEMORAL-POPLITEAL ARTERY;  Surgeon: Serafina Mitchell, MD;  Location: [facility];  Service: Vascular;  Laterality: Right;  using non-reversed sapphenous vein.  . Cardiac catheterization  05/02/13    x2   . Coronary artery bypass graft N/A 05/06/2013    Procedure: CORONARY ARTERY BYPASS GRAFTING  (CABG);  Surgeon: Melrose Nakayama, MD;  Location: [facility];  Service: Open Heart Surgery;  Laterality: N/A;  Coronary artery bypass graft times five using left internal mammary artery and left greater saphenous vein via endovein harvest.    Allergies  Allergen Reactions  . Benazepril Cough    Current Outpatient Prescriptions  Medication Sig Dispense Refill  . amLODipine (NORVASC) 10 MG tablet Take 0.5 tablets (5 mg total) by mouth daily.  90 tablet  3  . aspirin 325 MG EC tablet Take 325 mg by mouth daily.      Marland Kitchen atenolol (TENORMIN) 50 MG tablet Take 50 mg by mouth at bedtime.      Marland Kitchen atorvastatin (LIPITOR) 20 MG tablet Take 20 mg by mouth daily at 6 PM.      . cholecalciferol (VITAMIN D) 1000 UNITS tablet Take 1,000 Units by mouth daily.        . ciprofloxacin (CIPRO) 500 MG tablet Take 1 tablet (500 mg total) by mouth 2 (two) times daily.  14 tablet  0  . hydrALAZINE (APRESOLINE) 50 MG tablet Take 1 tablet (50 mg total) by mouth 3 (three) times daily.  270 tablet  3  . potassium chloride SA (K-DUR,KLOR-CON)  20 MEQ tablet Take 1 tablet (20 mEq total) by mouth 2 (two) times daily.  90 tablet  3  . sildenafil (VIAGRA) 100 MG tablet Take 100 mg by mouth daily as needed. For erectile dysfunction      . vitamin B-12 (CYANOCOBALAMIN) 1000 MCG tablet Take 1,000 mcg by mouth daily.      Marland Kitchen oxyCODONE-acetaminophen (PERCOCET/ROXICET) 5-325 MG per tablet Take 2 tablets by mouth every 4 (four) hours as needed for severe pain.  20 tablet  0   No current facility-administered medications for this visit.    Family History  Problem Relation Age of Onset  . Heart disease Mother     No clear CAD and Heart Disease before age 78  . Hypertension Mother   . Cancer Mother     ? colon ca  . Hyperlipidemia Mother   . Cancer Father     brain tumor  . Alzheimer's disease Father   . Colon cancer Neg Hx     History   Social History  . Marital Status: Widowed    Spouse Name: N/A    Number of Children:  2  . Years of Education: N/A   Occupational History  . [employer]   Social History Main Topics  . Smoking status: Former Smoker -- 2.00 packs/day for 47 years    Types: Cigarettes    Quit date: 12/17/2012  . Smokeless tobacco: Never Used  . Alcohol Use: 2.4 oz/week    4 Shots of liquor per week     Comment: 08/26/2013 "3-4 mixed drinks/wk"  . Drug Use: No  . Sexual Activity: Yes   Other Topics Concern  . Not on file   Social History Narrative   Widower     ROS: [x]  Positive   [ ]  Negative   [ ]  All sytems reviewed and are negative  Cardiovascular: []  chest pain/pressure []  palpitations []  SOB lying flat []  DOE [x]  pain in left leg while walking []  pain in feet when lying flat []  hx of DVT []  hx of phlebitis [x]  swelling in right leg []  varicose veins  Pulmonary: []  productive cough []  asthma []  wheezing  Neurologic: []  weakness in []  arms []  legs []  numbness in []  arms []  legs [] difficulty speaking or slurred speech []  temporary loss of vision in one eye []  dizziness  Hematologic: []  bleeding problems []  problems with blood clotting easily  GI []  vomiting blood []  blood in stool  GU: []  burning with urination []  blood in urine  Psychiatric: []  hx of major depression  Integumentary: []  rashes []  ulcers  Constitutional: []  fever []  chills   PHYSICAL EXAMINATION:  Filed Vitals:   07/21/14 1359  BP: 129/68  Pulse: 60  Temp: 98.4 F (36.9 C)  Resp: 16   Body mass index is 30.62 kg/(m^2).  General:  WDWN in NAD Gait: Not observed HENT: WNL, normocephalic Eyes: Pupils equal Pulmonary: normal non-labored breathing , without Rales, rhonchi,  wheezing Cardiac: RRR, without  Murmurs, rubs or gallops; without carotid bruits Abdomen: soft, NT, no masses Skin: without rashes, without ulcers  Vascular Exam/Pulses:  Right Left  Radial 2+ (normal) 2+ (normal)  Ulnar 2+ (normal) 2+ (normal)  Femoral 3+ (hyperdynamic)  Difficulty palpating  Popliteal 2+ (normal) Unable to palpate  DP 3+ (hyperdynamic) Unable to palpate  PT Unable to palpate Unable to palpate   Extremities: without ischemic changes, without Gangrene , without cellulitis; without open wounds;  Musculoskeletal: no muscle wasting or atrophy  Neurologic: A&O X 3; Appropriate Affect ; SENSATION: normal; MOTOR FUNCTION:  moving all extremities equally. Speech is fluent/normal   Non-Invasive Vascular Imaging:    Elevated velocities in right CFA stent 07/21/14  ABI's 07/21/14: Right:  1.12 Left:  0.77  ABI's 10/21/13: Right:  1.0 Left:  0.7   Pt meds includes: Statin:  Yes.   Beta Blocker:  Yes.   Aspirin:  Yes.   ACEI:  No. ARB:  No. Other Antiplatelet/Anticoagulant:  No.   ASSESSMENT/PLAN:: 63 y.o. male who has hx of right femoral to popliteal bypass grafting with saphenous vein 12/10/12.  He has done well since then and not having any problems in right leg with the exception of some mild swelling.  He has been advised to elevate legs when not ambulating and to wear compression stockings.  -he continues to have left leg claudication.  He states that even though he does have pain, it is still not anywhere near the severity of the right leg when the bypass was performed.  Dr. Trula Slade spoke with the pt and it is determined that he will continue with his treadmill regimen and follow up in 6 months.  He does have an appt with Dr. Burt Knack on September 8.  He will f/u with Dr. Trula Slade in 6 months with repeat ABI's and arterial duplex of the right leg.   Leontine Locket, PA-C [facility] [phone]  Clinic MD:  Pt seen and examined in conjunction with Dr. Trula Slade  I agree with the above.  I have seen and examined the patient.  I will need to pay close attention to the velocity elevation within the right iliac stent.  The patient has symptoms in his left leg.  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  I do  not think his symptoms warrant intervention at this time.  We will continue with surveillance.

## 2014-07-22 ENCOUNTER — Telehealth: Payer: Self-pay | Admitting: Pulmonary Disease

## 2014-07-22 DIAGNOSIS — G4733 Obstructive sleep apnea (adult) (pediatric): Secondary | ICD-10-CM

## 2014-07-22 NOTE — Telephone Encounter (Signed)
Called and spoke with pt and he stated that he is only sleeping with his cpap on about 2-3 hours per night.  He is waking up in the mornings with the cpap off and he does not realize during the night that he has taken this off.  Pt stated that he has tried everything that he knows of.  VS any further recs for this pt.  Thanks  Allergies  Allergen Reactions  . Benazepril Cough    Current Outpatient Prescriptions on File Prior to Visit  Medication Sig Dispense Refill  . amLODipine (NORVASC) 10 MG tablet Take 0.5 tablets (5 mg total) by mouth daily.  90 tablet  3  . aspirin 325 MG EC tablet Take 325 mg by mouth daily.      Marland Kitchen atenolol (TENORMIN) 50 MG tablet Take 50 mg by mouth at bedtime.      Marland Kitchen atorvastatin (LIPITOR) 20 MG tablet Take 20 mg by mouth daily at 6 PM.      . cholecalciferol (VITAMIN D) 1000 UNITS tablet Take 1,000 Units by mouth daily.        . ciprofloxacin (CIPRO) 500 MG tablet Take 1 tablet (500 mg total) by mouth 2 (two) times daily.  14 tablet  0  . hydrALAZINE (APRESOLINE) 50 MG tablet Take 1 tablet (50 mg total) by mouth 3 (three) times daily.  270 tablet  3  . oxyCODONE-acetaminophen (PERCOCET/ROXICET) 5-325 MG per tablet Take 2 tablets by mouth every 4 (four) hours as needed for severe pain.  20 tablet  0  . potassium chloride SA (K-DUR,KLOR-CON) 20 MEQ tablet Take 1 tablet (20 mEq total) by mouth 2 (two) times daily.  90 tablet  3  . sildenafil (VIAGRA) 100 MG tablet Take 100 mg by mouth daily as needed. For erectile dysfunction      . vitamin B-12 (CYANOCOBALAMIN) 1000 MCG tablet Take 1,000 mcg by mouth daily.       No current facility-administered medications on file prior to visit.

## 2014-07-23 NOTE — Telephone Encounter (Signed)
[id number] x1 

## 2014-07-23 NOTE — Telephone Encounter (Signed)
Download printed from [facility] and placed in your look-at. [place] Bing, CMA

## 2014-07-23 NOTE — Telephone Encounter (Signed)
I spoke with the pt and he states he has already been in contact with his DME and they have tried different masks and worked on mask fit and he is still having the issues. Please advise. [place] Bing, CMA

## 2014-07-23 NOTE — Telephone Encounter (Signed)
BiPAP 06/23/14 to 07/22/14 >> used on 30 of 30 nights with average 3 hrs and 20 min.  Average AHI is 4.3 with BiPAP 17/13 cm H2O.  Please inform pt that BiPAP report shows good control of apnea.  Will try to decrease pressure setting to 14/10 cm H2O to see if this helps him use BiPAP for entire night.  He should try this pressure change for 2 weeks, and then call back if no improvement.  I have sent order to change pressure to DME.

## 2014-07-23 NOTE — Telephone Encounter (Signed)
Please get download from his BiPAP machine.  Will call him with results, and discuss whether he needs adjustment to pressure setting of his machine.

## 2014-07-23 NOTE — Telephone Encounter (Signed)
He should start with contacting his DME to assess his mask fit.  He should call back if this does not fix problem.

## 2014-07-24 NOTE — Telephone Encounter (Signed)
Pt advised. Miklo Aken, CMA  

## 2014-08-05 ENCOUNTER — Ambulatory Visit (INDEPENDENT_AMBULATORY_CARE_PROVIDER_SITE_OTHER): Payer: BC Managed Care – PPO | Admitting: Physician Assistant

## 2014-08-05 ENCOUNTER — Encounter: Payer: Self-pay | Admitting: Physician Assistant

## 2014-08-05 VITALS — BP 150/68 | HR 63 | Ht 65.0 in | Wt 185.0 lb

## 2014-08-05 DIAGNOSIS — I739 Peripheral vascular disease, unspecified: Secondary | ICD-10-CM

## 2014-08-05 DIAGNOSIS — I10 Essential (primary) hypertension: Secondary | ICD-10-CM

## 2014-08-05 DIAGNOSIS — E785 Hyperlipidemia, unspecified: Secondary | ICD-10-CM

## 2014-08-05 DIAGNOSIS — I2510 Atherosclerotic heart disease of native coronary artery without angina pectoris: Secondary | ICD-10-CM

## 2014-08-05 NOTE — Patient Instructions (Addendum)
Your physician has requested that you have an echocardiogram. Echocardiography is a painless test that uses sound waves to create images of your heart. It provides your doctor with information about the size and shape of your heart and how well your heart's chambers and valves are working. This procedure takes approximately one hour. There are no restrictions for this procedure.  Your physician recommends that you continue on your current medications as directed. Please refer to the Current Medication list given to you today.  Your physician wants you to follow-up in: 6 months with Dr. Burt Knack. You will receive a reminder letter in the mail two months in advance. If you don't receive a letter, please call our office to schedule the follow-up appointment.

## 2014-08-05 NOTE — Progress Notes (Signed)
Cardiology Office Note    Date:  08/05/2014   ID:  Douglas Christensen, DOB [birth year], MRN [id number]  PCP:  [name], MD  Cardiologist:  Dr. Sherren Mocha     History of Present Illness: Douglas Christensen is a 63 y.o. male with a hx of CAD s/p CABG in 04/2013, PAD s/p R SFA stent and L CIA stent in 2008 and L EIA stent 2012, HTN, HL, OSA, GERD, prior ETOH abuse.  Last seen here by Truitt Merle, NP in 02/2014.  He returns for FU. He is doing well.  He has chronic dyspnea. He is NYHA 2-2b.  This is unchanged.  He denies chest pain.  He denies orthopnea, PND.  He sleeps with CPAP.  He has some chronic RLE edema that is mild and resolves with lying supine.  He denies syncope or palpitations.  He brings in a form today form the [employer].  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  He needs an ECG, echo and letter from me.     Studies:  - LHC (6/14):  prox and mid LM 40-50%, prox LAD 70%, ostial Dx 75%, mid LAD 90-95%, prox CFX 60-70%, mid RCA occluded, EF 55-65% >>> CABG  - Carotid US (1/14):  Bilateral ICA 1-39%   Recent Labs/Images: 03/13/2014: ALT 51; HDL Cholesterol by NMR 45.60; LDL (calc) 75  06/29/2014: Creatinine 0.91; Hemoglobin 15.6; Potassium 4.3   Wt Readings from Last 3 Encounters:  08/05/14 185 lb (83.915 kg)  07/21/14 184 lb (83.462 kg)  06/29/14 180 lb (81.647 kg)     Past Medical History  Diagnosis Date  . History of colonic polyps   . COPD (chronic obstructive pulmonary disease)   . CAD (coronary artery disease)     Mild plaque (cath "years ago"); abnormal Myoview 04/2013 with subsequent CABG x 5 with LIMA to LAD, SVG to OM1, SVG to DX, SVG to PD & PL.   Marland Kitchen GERD (gastroesophageal reflux disease)   . Hyperlipidemia   . Hypertension   . Vitamin D deficiency   . Meralgia paresthetica of left side 2011  . LBP (low back pain)   . PVD (peripheral vascular disease)     Stent to left common femoral and right superficial femoral.  2008.  50%  left renal   . Sleep apnea     mod OSA, central sleep  apnea/hypoapnea syndrome 11/22/12  . Shortness of breath     "once in awhile; can happen at anytime" (08/26/2013)    Current Outpatient Prescriptions  Medication Sig Dispense Refill  . amLODipine (NORVASC) 10 MG tablet Take 0.5 tablets (5 mg total) by mouth daily.  90 tablet  3  . aspirin 325 MG EC tablet Take 325 mg by mouth daily.      Marland Kitchen atenolol (TENORMIN) 50 MG tablet Take 50 mg by mouth at bedtime.      Marland Kitchen atorvastatin (LIPITOR) 20 MG tablet Take 20 mg by mouth daily at 6 PM.      . cholecalciferol (VITAMIN D) 1000 UNITS tablet Take 1,000 Units by mouth daily.        . hydrALAZINE (APRESOLINE) 50 MG tablet Take 1 tablet (50 mg total) by mouth 3 (three) times daily.  270 tablet  3  . potassium chloride SA (K-DUR,KLOR-CON) 20 MEQ tablet Take 1 tablet (20 mEq total) by mouth 2 (two) times daily.  90 tablet  3  . sildenafil (VIAGRA) 100 MG tablet Take 100 mg by mouth daily as needed. For  erectile dysfunction      . vitamin B-12 (CYANOCOBALAMIN) 1000 MCG tablet Take 1,000 mcg by mouth daily.       No current facility-administered medications for this visit.     Allergies:   Benazepril   Social History:  The patient  reports that he quit smoking about 19 months ago. His smoking use included Cigarettes. He has a 94 pack-year smoking history. He has never used smokeless tobacco. He reports that he drinks about 2.4 ounces of alcohol per week. He reports that he does not use illicit drugs.   Family History:  The patient's family history includes Alzheimer's disease in his father; Cancer in his father and mother; Heart disease in his mother; Hyperlipidemia in his mother; Hypertension in his mother. There is no history of Colon cancer or Heart attack.   ROS:  Please see the history of present illness.   He has had increasing claudication on the L.  He is followed by [facility].   All other systems reviewed and negative.   PHYSICAL EXAM: VS:  BP 150/68  Pulse 63  Ht 5\' 5"  (1.651 m)  Wt 185 lb (83.915  kg)  BMI 30.79 kg/m2 Well nourished, well developed, in no acute distress HEENT: normal Neck: no JVD Cardiac:  normal S1, S2; RRR; no murmur Lungs:  clear to auscultation bilaterally, no wheezing, rhonchi or rales Abd: soft, nontender, no hepatomegaly Ext: trace RLE; no LLE edema Skin: warm and dry Neuro:  CNs 2-12 intact, no focal abnormalities noted  EKG:  NSR, HR 63, no change from prior tracing     ASSESSMENT AND PLAN:  1. Atherosclerosis of native coronary artery of native heart without angina pectoris:  Stable without angina.  Continue ASA, beta blocker, statin.  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  He needs an echocardiogram.  This will be scheduled. 2. HYPERTENSION:  BP elevated today.  BPs usually are well controlled.  Monitor.  3. HYPERLIPIDEMIA:  Continue statin.   4. PERIPHERAL VASCULAR DISEASE:  FU with [facility]. 5. Edema:  Likely dependent edema.  No further intervention at this time.  We discussed keeping his legs elevated, etc.   Disposition:  FU with Dr. Sherren Mocha in 6 mos.    Signed, Versie Starks, MHS 08/05/2014 12:12 PM    [address], [place], [place]  [address] Phone: [phone]; Fax: [phone]

## 2014-08-11 ENCOUNTER — Ambulatory Visit (HOSPITAL_COMMUNITY): Payer: BC Managed Care – PPO | Attending: Internal Medicine

## 2014-08-11 DIAGNOSIS — M545 Low back pain, unspecified: Secondary | ICD-10-CM | POA: Diagnosis not present

## 2014-08-11 DIAGNOSIS — R0989 Other specified symptoms and signs involving the circulatory and respiratory systems: Secondary | ICD-10-CM | POA: Insufficient documentation

## 2014-08-11 DIAGNOSIS — J449 Chronic obstructive pulmonary disease, unspecified: Secondary | ICD-10-CM | POA: Insufficient documentation

## 2014-08-11 DIAGNOSIS — I359 Nonrheumatic aortic valve disorder, unspecified: Secondary | ICD-10-CM | POA: Diagnosis not present

## 2014-08-11 DIAGNOSIS — E785 Hyperlipidemia, unspecified: Secondary | ICD-10-CM | POA: Insufficient documentation

## 2014-08-11 DIAGNOSIS — I2510 Atherosclerotic heart disease of native coronary artery without angina pectoris: Secondary | ICD-10-CM | POA: Insufficient documentation

## 2014-08-11 DIAGNOSIS — G4730 Sleep apnea, unspecified: Secondary | ICD-10-CM | POA: Diagnosis not present

## 2014-08-11 DIAGNOSIS — I517 Cardiomegaly: Secondary | ICD-10-CM | POA: Diagnosis not present

## 2014-08-11 DIAGNOSIS — E559 Vitamin D deficiency, unspecified: Secondary | ICD-10-CM | POA: Diagnosis not present

## 2014-08-11 DIAGNOSIS — Z87891 Personal history of nicotine dependence: Secondary | ICD-10-CM | POA: Diagnosis not present

## 2014-08-11 DIAGNOSIS — K219 Gastro-esophageal reflux disease without esophagitis: Secondary | ICD-10-CM | POA: Diagnosis not present

## 2014-08-11 DIAGNOSIS — I739 Peripheral vascular disease, unspecified: Secondary | ICD-10-CM | POA: Insufficient documentation

## 2014-08-11 DIAGNOSIS — R0609 Other forms of dyspnea: Secondary | ICD-10-CM | POA: Diagnosis not present

## 2014-08-11 DIAGNOSIS — I059 Rheumatic mitral valve disease, unspecified: Secondary | ICD-10-CM | POA: Diagnosis not present

## 2014-08-11 DIAGNOSIS — J4489 Other specified chronic obstructive pulmonary disease: Secondary | ICD-10-CM | POA: Insufficient documentation

## 2014-08-11 NOTE — Progress Notes (Signed)
2D Echo completed. 08/11/2014

## 2014-08-12 ENCOUNTER — Encounter: Payer: Self-pay | Admitting: Physician Assistant

## 2014-08-13 ENCOUNTER — Telehealth: Payer: Self-pay | Admitting: Physician Assistant

## 2014-08-13 NOTE — Telephone Encounter (Signed)
pt notified about echo results with verbal understanding. A copy of echo report will be left at the front desk for pt to pick up.

## 2014-08-13 NOTE — Telephone Encounter (Signed)
New message ° ° ° ° ° °Want echo results °

## 2014-09-03 ENCOUNTER — Telehealth: Payer: Self-pay | Admitting: Pulmonary Disease
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
## 2014-09-04 NOTE — Telephone Encounter (Signed)
Called [facility] and spoke with [place]. She reports pt pressure was changed on 07/27/14.  I made her aware of pt complaint as well. She reports they don't always have RT in the office, so they prefer pt call before just showing up to make sure someone there can help. Will forward to Dr. Halford Chessman as an [name]

## 2014-09-08 ENCOUNTER — Telehealth: Payer: Self-pay | Admitting: Internal Medicine

## 2014-09-08 ENCOUNTER — Encounter: Payer: Self-pay | Admitting: Gastroenterology

## 2014-09-08 NOTE — Telephone Encounter (Signed)
Pt request to speak to the assistant concern about letter the Dr. Camila Li wrote him out from work and record from 11/2013-08/2014 for insurance comp, pt only want to speak to the assistant (we refer him to speak to HIM, but he wants to talk to the assistant first). Please call pt

## 2014-09-08 NOTE — Telephone Encounter (Signed)
Updated letter upfront for p/u. Pt informed

## 2014-09-24 ENCOUNTER — Other Ambulatory Visit: Payer: Self-pay | Admitting: Internal Medicine

## 2014-09-29 ENCOUNTER — Other Ambulatory Visit: Payer: Self-pay | Admitting: Internal Medicine

## 2014-10-20 ENCOUNTER — Encounter (HOSPITAL_COMMUNITY): Payer: 59

## 2014-10-20 ENCOUNTER — Ambulatory Visit (INDEPENDENT_AMBULATORY_CARE_PROVIDER_SITE_OTHER): Payer: BC Managed Care – PPO | Admitting: Pulmonary Disease

## 2014-10-20 ENCOUNTER — Other Ambulatory Visit (HOSPITAL_COMMUNITY): Payer: 59

## 2014-10-20 ENCOUNTER — Ambulatory Visit: Payer: 59 | Admitting: Family

## 2014-10-20 ENCOUNTER — Encounter: Payer: Self-pay | Admitting: Pulmonary Disease

## 2014-10-20 VITALS — BP 128/62 | HR 60 | Ht 65.0 in | Wt 197.2 lb

## 2014-10-20 DIAGNOSIS — G4731 Primary central sleep apnea: Secondary | ICD-10-CM

## 2014-10-20 DIAGNOSIS — G4737 Central sleep apnea in conditions classified elsewhere: Secondary | ICD-10-CM

## 2014-10-20 DIAGNOSIS — E669 Obesity, unspecified: Secondary | ICD-10-CM

## 2014-10-20 DIAGNOSIS — G4733 Obstructive sleep apnea (adult) (pediatric): Secondary | ICD-10-CM

## 2014-10-20 DIAGNOSIS — R0600 Dyspnea, unspecified: Secondary | ICD-10-CM

## 2014-10-20 NOTE — Patient Instructions (Signed)
Will get copy of BiPAP report Follow up in 1 year 

## 2014-10-20 NOTE — Progress Notes (Signed)
Chief Complaint  Patient presents with  . Follow-up    Pt reports good tolerance with CPAP. No complaints with mask/pressure.  Pt c/o increased SOB with weight gain.- 15lb in 8 months    History of Present Illness: Douglas Christensen is a 63 y.o. male former smoker with complex sleep apnea.  He has been using BiPAP.  He feels this helps.  He does not have trouble with daytime sleepiness.  He does not have any difficulty with his mask fit.  He has gained weight.  He notices that he gets more short of breath when he bends over to tie his shoes.  He can walk on a treadmill, and does not feel too much difficulty with his breathing during this type of activity.  Tests: HST 11/23/12 >> AHI 15.8, SpO2 low 83%, both obstructive and central events BiPAP 02/21/14 >> BiPAP 17/13 cm H2O >> AHI 0, + R. Centrals with lower pressures. BiPAP 09/20/14 to 10/19/14 >> used on 30 of 30 nights with average 6 hrs 9 min.  Average AHI 3.3 with BiPAP 14/10 cm H2O  PMHx >> COPD, CAD, PVD, HLD, HTN, GERD  Outpatient Encounter Prescriptions as of 10/20/2014  Medication Sig  . amLODipine (NORVASC) 10 MG tablet Take 0.5 tablets (5 mg total) by mouth daily.  Marland Kitchen aspirin 325 MG EC tablet Take 325 mg by mouth daily.  Marland Kitchen atenolol (TENORMIN) 50 MG tablet Take 50 mg by mouth at bedtime.  Marland Kitchen atorvastatin (LIPITOR) 20 MG tablet Take 20 mg by mouth daily at 6 PM.  . atorvastatin (LIPITOR) 20 MG tablet TAKE 1 TABLET BY MOUTH DAILY  . cholecalciferol (VITAMIN D) 1000 UNITS tablet Take 1,000 Units by mouth daily.    . clonazePAM (KLONOPIN) 0.25 MG disintegrating tablet DISSOLVE 1 TABLET BY MOUTH TWICE DAILY AS NEEDED.  . hydrALAZINE (APRESOLINE) 50 MG tablet Take 1 tablet (50 mg total) by mouth 3 (three) times daily.  . potassium chloride SA (K-DUR,KLOR-CON) 20 MEQ tablet Take 1 tablet (20 mEq total) by mouth 2 (two) times daily.  . sildenafil (VIAGRA) 100 MG tablet Take 100 mg by mouth daily as needed. For erectile dysfunction  .  vitamin B-12 (CYANOCOBALAMIN) 1000 MCG tablet Take 1,000 mcg by mouth daily.    PSHx, Allergies, Fhx, Shx reviewed.   Physical Exam: Blood pressure 128/62, pulse 60, height 5\' 5"  (1.651 m), weight 197 lb 3.2 oz (89.449 kg), SpO2 96 %.  General - No distress ENT - No sinus tenderness, no oral exudate, no LAN, no thyromegaly, TM clear, pupils equal/reactive, MP 4, scalloped tongue Cardiac - s1s2 regular, no murmur, pulses symmetric Chest - No wheeze/rales/dullness, good air entry, normal respiratory excursion Back - No focal tenderness Abd - Soft, non-tender, no organomegaly, + bowel sounds Ext - No edema Neuro - Normal strength, cranial nerves intact Skin - No rashes Psych - Normal mood, and behavior  Impression:  Complex sleep apnea >> he reports compliance with therapy, and benefit from BiPAP. Plan: - continue BiPAP 14/10 cm H2O  Dyspnea >> currently symptoms likely related to obesity and abdominal girth causing compression on diaphragm when he bends over. Plan: - discussed importance of weight - reviewed options for dietary modification and meal planning to assist with weight loss   Chesley Mires, MD [place] Pager:  [phone]

## 2014-11-04 ENCOUNTER — Ambulatory Visit (INDEPENDENT_AMBULATORY_CARE_PROVIDER_SITE_OTHER): Payer: BC Managed Care – PPO | Admitting: Internal Medicine

## 2014-11-04 ENCOUNTER — Encounter: Payer: Self-pay | Admitting: Internal Medicine

## 2014-11-04 VITALS — BP 152/72 | HR 64 | Temp 97.9°F | Ht 65.0 in | Wt 191.2 lb

## 2014-11-04 DIAGNOSIS — M5416 Radiculopathy, lumbar region: Secondary | ICD-10-CM

## 2014-11-04 DIAGNOSIS — I739 Peripheral vascular disease, unspecified: Secondary | ICD-10-CM

## 2014-11-04 DIAGNOSIS — K2100 Gastro-esophageal reflux disease with esophagitis, without bleeding: Secondary | ICD-10-CM

## 2014-11-04 DIAGNOSIS — I10 Essential (primary) hypertension: Secondary | ICD-10-CM

## 2014-11-04 DIAGNOSIS — E538 Deficiency of other specified B group vitamins: Secondary | ICD-10-CM

## 2014-11-04 DIAGNOSIS — B351 Tinea unguium: Secondary | ICD-10-CM

## 2014-11-04 DIAGNOSIS — E559 Vitamin D deficiency, unspecified: Secondary | ICD-10-CM

## 2014-11-04 DIAGNOSIS — Z0000 Encounter for general adult medical examination without abnormal findings: Secondary | ICD-10-CM

## 2014-11-04 DIAGNOSIS — I2510 Atherosclerotic heart disease of native coronary artery without angina pectoris: Secondary | ICD-10-CM

## 2014-11-04 MED ORDER — CLONAZEPAM 0.25 MG PO TBDP: ORAL_TABLET | ORAL | Status: DC

## 2014-11-04 NOTE — Progress Notes (Signed)
Pre visit review using our clinic review tool, if applicable. No additional management support is needed unless otherwise documented below in the visit note. 

## 2014-11-04 NOTE — Assessment & Plan Note (Signed)
Podiatry ref 

## 2014-11-04 NOTE — Assessment & Plan Note (Signed)
We discussed age appropriate health related issues, including available/recomended screening tests and vaccinations. We discussed a need for adhering to healthy diet and exercise. Labs/EKG were reviewed/ordered. All questions were answered.   
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
## 2014-11-05 ENCOUNTER — Other Ambulatory Visit (INDEPENDENT_AMBULATORY_CARE_PROVIDER_SITE_OTHER): Payer: BC Managed Care – PPO

## 2014-11-05 ENCOUNTER — Encounter (HOSPITAL_COMMUNITY): Payer: Self-pay | Admitting: Cardiovascular Disease

## 2014-11-05 DIAGNOSIS — E785 Hyperlipidemia, unspecified: Secondary | ICD-10-CM

## 2014-11-05 DIAGNOSIS — Z0000 Encounter for general adult medical examination without abnormal findings: Secondary | ICD-10-CM

## 2014-11-05 LAB — CBC WITH DIFFERENTIAL/PLATELET
BASOS PCT: 0.6 % (ref 0.0–3.0)
Basophils Absolute: 0 10*3/uL (ref 0.0–0.1)
EOS PCT: 3.8 % (ref 0.0–5.0)
Eosinophils Absolute: 0.3 10*3/uL (ref 0.0–0.7)
HEMATOCRIT: 47.6 % (ref 39.0–52.0)
HEMOGLOBIN: 15.9 g/dL (ref 13.0–17.0)
LYMPHS ABS: 2.4 10*3/uL (ref 0.7–4.0)
Lymphocytes Relative: 33.7 % (ref 12.0–46.0)
MCHC: 33.4 g/dL (ref 30.0–36.0)
MCV: 96.9 fl (ref 78.0–100.0)
MONOS PCT: 9 % (ref 3.0–12.0)
Monocytes Absolute: 0.6 10*3/uL (ref 0.1–1.0)
Neutro Abs: 3.7 10*3/uL (ref 1.4–7.7)
Neutrophils Relative %: 52.9 % (ref 43.0–77.0)
Platelets: 243 10*3/uL (ref 150.0–400.0)
RBC: 4.92 Mil/uL (ref 4.22–5.81)
RDW: 14 % (ref 11.5–15.5)
WBC: 7 10*3/uL (ref 4.0–10.5)

## 2014-11-05 LAB — PSA: PSA: 0.86 ng/mL (ref 0.10–4.00)

## 2014-11-05 LAB — BASIC METABOLIC PANEL
BUN: 14 mg/dL (ref 6–23)
CO2: 25 mEq/L (ref 19–32)
Calcium: 9.2 mg/dL (ref 8.4–10.5)
Chloride: 102 mEq/L (ref 96–112)
Creatinine, Ser: 0.9 mg/dL (ref 0.4–1.5)
GFR: 91.56 mL/min (ref >60.00)
Glucose, Bld: 92 mg/dL (ref 70–99)
POTASSIUM: 3.8 meq/L (ref 3.5–5.1)
Sodium: 136 mEq/L (ref 135–145)

## 2014-11-05 LAB — HEPATIC FUNCTION PANEL
ALT: 42 U/L (ref 0–53)
AST: 44 U/L — ABNORMAL HIGH (ref 0–37)
Albumin: 3.9 g/dL (ref 3.5–5.2)
Alkaline Phosphatase: 67 U/L (ref 39–117)
BILIRUBIN TOTAL: 0.9 mg/dL (ref 0.2–1.2)
Bilirubin, Direct: 0.2 mg/dL (ref 0.0–0.3)
TOTAL PROTEIN: 7 g/dL (ref 6.0–8.3)

## 2014-11-05 LAB — URINALYSIS
BILIRUBIN URINE: NEGATIVE
Hgb urine dipstick: NEGATIVE
KETONES UR: NEGATIVE
LEUKOCYTES UA: NEGATIVE
Nitrite: NEGATIVE
SPECIFIC GRAVITY, URINE: 1.02 (ref 1.000–1.030)
URINE GLUCOSE: NEGATIVE
UROBILINOGEN UA: 0.2 (ref 0.0–1.0)
pH: 6 (ref 5.0–8.0)

## 2014-11-05 LAB — LIPID PANEL
CHOL/HDL RATIO: 5
CHOLESTEROL: 207 mg/dL — AB (ref 0–200)
HDL: 44 mg/dL (ref >39.00)
NonHDL: 163
Triglycerides: 224 mg/dL — ABNORMAL HIGH (ref 0.0–149.0)
VLDL: 44.8 mg/dL — AB (ref 0.0–40.0)

## 2014-11-05 LAB — LDL CHOLESTEROL, DIRECT: Direct LDL: 138.6 mg/dL

## 2014-11-05 LAB — TSH: TSH: 3.78 u[IU]/mL (ref 0.35–4.50)

## 2014-11-06 ENCOUNTER — Encounter (HOSPITAL_COMMUNITY): Payer: Self-pay | Admitting: Cardiovascular Disease

## 2014-11-18 ENCOUNTER — Encounter: Payer: Self-pay | Admitting: Internal Medicine

## 2014-11-18 ENCOUNTER — Ambulatory Visit (INDEPENDENT_AMBULATORY_CARE_PROVIDER_SITE_OTHER): Payer: BC Managed Care – PPO

## 2014-11-18 VITALS — BP 171/80 | HR 63 | Resp 15 | Ht 65.0 in | Wt 190.0 lb

## 2014-11-18 DIAGNOSIS — I739 Peripheral vascular disease, unspecified: Secondary | ICD-10-CM

## 2014-11-18 DIAGNOSIS — M79676 Pain in unspecified toe(s): Secondary | ICD-10-CM

## 2014-11-18 DIAGNOSIS — B351 Tinea unguium: Secondary | ICD-10-CM

## 2014-11-18 NOTE — Assessment & Plan Note (Signed)
Chronic  Dr Burt Knack STENTs in B LEs

## 2014-11-18 NOTE — Assessment & Plan Note (Signed)
Chronic  He had coronary bypass grafting x5 on June 10

## 2014-11-18 NOTE — Assessment & Plan Note (Signed)
Continue with current OTC Vit D3

## 2014-11-18 NOTE — Assessment & Plan Note (Signed)
B sciatica, intermittent

## 2014-11-18 NOTE — Progress Notes (Signed)
   Subjective:    Patient ID: Douglas Christensen, male    DOB: Sep 18, 1951, 63 y.o.   MRN: [id number]  HPI Comments: N toenail fungus L left 1st toenail D years O worse in the summer C white and thickened A  T Dr. Alain Marion prescribed oral medication and lacquers     Review of Systems  Constitutional: Positive for unexpected weight change.       Objective:   Physical Exam 63 year old white male presents this time on for from Dr. plan a cup. Patient has a long-standing history of his coloration white friable discoloration and brittleness of the left great toe has been on Friday treatments including oral antifungal Lamisil topical antifungals debridement self-care little or no success. Lower extremity objective findings as follows pedal pulses are palpable DP +2 over 4 on the right PT nonpalpable bilateral DP nonpalpable on the left patient's had bypass surgery on right leg has also had coronary bypass surgery 5. Patient is scheduled her status is mostly follow-up for vascular reconstruction on the left foot and leg some point in near future is encouraged to continue to do so is pulses are nonpalpable temperature is warm to cool turgor somewhat diminished minimal edema noted there is decreased sensation Semmes Weinstein the forefoot digits and arch otherwise normal plantar response and DTRs are noted. Dermatologic the skin color pigment normal hair growth absent       Assessment & Plan:  Assessment this time is suspect onychomycosis left hallux nail plate distal two thirds of the nails involved centrally with white discoloration separation of the nailbed and the nail plate however is not is any medication last month or 2 however suggested nail culture before we consider any treatment resistant nail clipping or deep debridement of nail for culture and KOH and staining for fungus. At this time nail clippings are obtained as the most proximal point of the lytic nail deformity. These clippings are  obtained with some slight discomfort to the patient submitted at this time for pathology analysis with a collapse will do a PAS a fungal culture and DNA identification of the fungus. And will be followed up within the next month for results of fungal cultures and stains and to initiate appropriate oral or topical antifungal therapy in the interim keep the area clean dried and dressed suggested some soap and water and daily cleansing. Follow-up in one month for culture results  Harriet Masson DPM

## 2014-11-18 NOTE — Assessment & Plan Note (Signed)
Continue with current prescription therapy as reflected on the Med list.  

## 2014-11-18 NOTE — Patient Instructions (Signed)

## 2014-11-19 NOTE — Addendum Note (Signed)
Addended by: Lolita Rieger on: 11/19/2014 10:52 AM   Modules accepted: Orders

## 2014-12-01 ENCOUNTER — Telehealth: Payer: Self-pay | Admitting: Pulmonary Disease

## 2014-12-01 NOTE — Telephone Encounter (Signed)
Based on these numbers he is compliant with BiPAP therapy.  Okay to give him copy of BiPAP download.

## 2014-12-01 NOTE — Telephone Encounter (Signed)
Spoke with pt.  Discussed below.  He verbalized understanding.  Copy placed at front for pick up.  Pt aware.  Download placed in Dr. Juanetta Gosling folder for "offical" look at when he returns to office.  Will sign off and route to VS as FYI.

## 2014-12-01 NOTE — Telephone Encounter (Signed)
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 I have called and requested this from [facility]. The pt needs this download ASAP, it has to be to the [facility] in 10 days. Please advsie if I can give the pt a copy of this download or do you want to review it first? I have listed some info from the download below. Paper copy of the download is in triage.   Usage days: 86/90 days >= 4 hours: 59 days Average usage 5 hours 23 minutes AHI 4.1 Settings Ipap 14, EPAP 10

## 2014-12-04 ENCOUNTER — Telehealth: Payer: Self-pay | Admitting: Internal Medicine

## 2014-12-04 NOTE — Telephone Encounter (Signed)
[place]  Letter faxed on 23rd of dec, asking if Dr is disabling this pt?   Requesting response back  [phone] Ext [phone]

## 2014-12-05 NOTE — Telephone Encounter (Signed)
Have you seen the letter? If not, please advise on disability and I will call them.

## 2014-12-05 NOTE — Telephone Encounter (Signed)
Yes, he is disabled Thx

## 2014-12-05 NOTE — Telephone Encounter (Signed)
Left detailed mess at number below advising of MD response.

## 2014-12-11 ENCOUNTER — Encounter (HOSPITAL_COMMUNITY): Payer: Self-pay | Admitting: Cardiovascular Disease

## 2014-12-15 ENCOUNTER — Telehealth: Payer: Self-pay

## 2014-12-15 NOTE — Telephone Encounter (Signed)
Spoke with pt to schedule. He is aware that he is on NP schedule, dpm

## 2014-12-15 NOTE — Telephone Encounter (Signed)
Phone call from pt.  Reported he has increased difficulty walking due to pain left leg.  Reported there is pain with standing and with walking on left leg; stated the pain occurs in either the hip, the thigh, or the calf.  Stated it improves with rest.  Denies swelling.  Denies any rest pain.  Denies open sores or tissue loss.  Stated "he knew that I would eventually need treatment on my left leg."  Advised will call pt. Back with an earlier appt.  Encouraged to call back if sx's worsen, prior to new appt. Verb. Understanding.

## 2014-12-16 ENCOUNTER — Ambulatory Visit (INDEPENDENT_AMBULATORY_CARE_PROVIDER_SITE_OTHER): Payer: BLUE CROSS/BLUE SHIELD

## 2014-12-16 VITALS — BP 118/58 | HR 86 | Resp 18

## 2014-12-16 DIAGNOSIS — M79676 Pain in unspecified toe(s): Secondary | ICD-10-CM

## 2014-12-16 DIAGNOSIS — I739 Peripheral vascular disease, unspecified: Secondary | ICD-10-CM | POA: Diagnosis not present

## 2014-12-16 DIAGNOSIS — B351 Tinea unguium: Secondary | ICD-10-CM | POA: Diagnosis not present

## 2014-12-16 MED ORDER — ITRACONAZOLE 100 MG PO CAPS: ORAL_CAPSULE | ORAL | Status: DC

## 2014-12-16 NOTE — Patient Instructions (Signed)
While taking the antifungal medication generic for Sporanox. Discontinue taking your cholesterol medicine for those 7 days. This may help reduce any of the drug interaction issues. Contact us immediately if there is any muscle ache or muscle pains GI problems.

## 2014-12-16 NOTE — Progress Notes (Signed)
   Subjective:    Patient ID: Douglas Christensen, male    DOB: 1951/06/14, 64 y.o.   MRN: [id number]  HPI I AM HERE TO GET THE RESULTS OF MY TOENAIL CULTURE    Review of Systems no new findings or systemic changes noted     Objective:   Physical Exam Neurovascular status is intact and unchanged patient does have discoloration friability of nails the nail cultures have come back positive for characteristics of saprophytic mold either them species racker moaning species or Aspergillus based on the fact that + nontraumatic fight and is athletic mold my recommendation is time is Sporanox or generic itch, so rather than terbinafine or Lamisil which does not work on saprophytic infections. Standard there is some interaction with his cholesterol medication which was temporary discontinue his cholesterol medicine for the weakest taking the pulse dose of Sporanox.       Assessment & Plan:  Assessment onychomycosis with a nontender gratified saprophytic infection of the nails cut at this time prescription for itraconazole 100 mg 2 tablets twice a day 7 days with 3 weeks off and repeat those pulses 2 more times. A 3 month treatment she one week per month for 3 months is carried out at this time. Also lab request to be done for hepatic function studies 3 weeks from now. Contact us immediately if there is any side effects GI upset or any muscle aches or pains develop.'s of any rashes develop. Recheck in 3-6 months for follow-up and nail check next  Harriet Masson DPM

## 2014-12-17 ENCOUNTER — Telehealth: Payer: Self-pay | Admitting: *Deleted

## 2014-12-17 NOTE — Telephone Encounter (Signed)
Pt states the insurance put a hold on his medication and he was instructed to let Dr. Blenda Mounts if there was a problem.

## 2014-12-18 ENCOUNTER — Telehealth: Payer: Self-pay | Admitting: *Deleted

## 2014-12-18 ENCOUNTER — Encounter: Payer: Self-pay | Admitting: Family

## 2014-12-18 NOTE — Telephone Encounter (Signed)
Need to contact his insurance and given override. Patient's cultures indicate a nontraumatic fight saprophytic mold infection. Lamisil or terbinafine will not work for this infection. The only medication indicated for this infection is Sporanox or itraconazole. Need to get an override on his behalf  Harriet Masson DPM

## 2014-12-18 NOTE — Telephone Encounter (Signed)
Prior authorization

## 2014-12-18 NOTE — Telephone Encounter (Signed)
Pt called request status of the medication his insurance won't cover.  I told pt that Dr. Blenda Mounts is aware of the problem and has his assistant in [place] looking into pre-certification.

## 2014-12-22 ENCOUNTER — Ambulatory Visit (HOSPITAL_COMMUNITY)
Admission: RE | Admit: 2014-12-22 | Discharge: 2014-12-22 | Disposition: A | Discharge status: Home / Self Care | Payer: BLUE CROSS/BLUE SHIELD | Source: Ambulatory Visit | Attending: Family | Admitting: Family

## 2014-12-22 ENCOUNTER — Ambulatory Visit (INDEPENDENT_AMBULATORY_CARE_PROVIDER_SITE_OTHER): Payer: BLUE CROSS/BLUE SHIELD | Admitting: Family

## 2014-12-22 ENCOUNTER — Ambulatory Visit (INDEPENDENT_AMBULATORY_CARE_PROVIDER_SITE_OTHER)
Admission: RE | Admit: 2014-12-22 | Discharge: 2014-12-22 | Disposition: A | Discharge status: Home / Self Care | Payer: BLUE CROSS/BLUE SHIELD | Source: Ambulatory Visit | Attending: Surgery | Admitting: Surgery

## 2014-12-22 ENCOUNTER — Other Ambulatory Visit: Payer: Self-pay

## 2014-12-22 ENCOUNTER — Other Ambulatory Visit: Payer: Self-pay | Admitting: Surgery

## 2014-12-22 ENCOUNTER — Encounter: Payer: Self-pay | Admitting: Family

## 2014-12-22 VITALS — BP 128/73 | HR 84 | Resp 16 | Ht 65.0 in | Wt 190.0 lb

## 2014-12-22 DIAGNOSIS — I739 Peripheral vascular disease, unspecified: Secondary | ICD-10-CM

## 2014-12-22 DIAGNOSIS — Z48812 Encounter for surgical aftercare following surgery on the circulatory system: Secondary | ICD-10-CM

## 2014-12-22 DIAGNOSIS — Z95828 Presence of other vascular implants and grafts: Secondary | ICD-10-CM

## 2014-12-22 DIAGNOSIS — Z9889 Other specified postprocedural states: Secondary | ICD-10-CM

## 2014-12-22 DIAGNOSIS — Z87891 Personal history of nicotine dependence: Secondary | ICD-10-CM | POA: Diagnosis not present

## 2014-12-22 NOTE — Telephone Encounter (Signed)
Mateo Flow the pre-certain on this patient cannot be done by Lattie Haw. She does not have access to his lab results from fungal cultures that were done in [place]. This precertified has to be done by somebody in [place] copies of the man's lab results to the appropriate insurance. This needs to be done by you or Delydia in the [place] office lab results are not available on the computer next  Harriet Masson DPM
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
## 2014-12-22 NOTE — Patient Instructions (Signed)
Peripheral Vascular Disease Peripheral Vascular Disease (PVD), also called Peripheral Arterial Disease (PAD), is a circulation problem caused by cholesterol (atherosclerotic plaque) deposits in the arteries. PVD commonly occurs in the lower extremities (legs) but it can occur in other areas of the body, such as your arms. The cholesterol buildup in the arteries reduces blood flow which can cause pain and other serious problems. The presence of PVD can place a person at risk for Coronary Artery Disease (CAD).  CAUSES  Causes of PVD can be many. It is usually associated with more than one risk factor such as:   High Cholesterol.  Smoking.  Diabetes.  Lack of exercise or inactivity.  High blood pressure (hypertension).  Obesity.  Family history. SYMPTOMS   When the lower extremities are affected, patients with PVD may experience:  Leg pain with exertion or physical activity. This is called INTERMITTENT CLAUDICATION. This may present as cramping or numbness with physical activity. The location of the pain is associated with the level of blockage. For example, blockage at the abdominal level (distal abdominal aorta) may result in buttock or hip pain. Lower leg arterial blockage may result in calf pain.  As PVD becomes more severe, pain can develop with less physical activity.  In people with severe PVD, leg pain may occur at rest.  Other PVD signs and symptoms:  Leg numbness or weakness.  Coldness in the affected leg or foot, especially when compared to the other leg.  A change in leg color.  Patients with significant PVD are more prone to ulcers or sores on toes, feet or legs. These may take longer to heal or may reoccur. The ulcers or sores can become infected.  If signs and symptoms of PVD are ignored, gangrene may occur. This can result in the loss of toes or loss of an entire limb.  Not all leg pain is related to PVD. Other medical conditions can cause leg pain such  as:  Blood clots (embolism) or Deep Vein Thrombosis.  Inflammation of the blood vessels (vasculitis).  Spinal stenosis. DIAGNOSIS  Diagnosis of PVD can involve several different types of tests. These can include:  Pulse Volume Recording Method (PVR). This test is simple, painless and does not involve the use of X-rays. PVR involves measuring and comparing the blood pressure in the arms and legs. An ABI (Ankle-Brachial Index) is calculated. The normal ratio of blood pressures is 1. As this number becomes smaller, it indicates more severe disease.  < 0.95 - indicates significant narrowing in one or more leg vessels.  <0.8 - there will usually be pain in the foot, leg or buttock with exercise.  <0.4 - will usually have pain in the legs at rest.  <0.25 - usually indicates limb threatening PVD.  Doppler detection of pulses in the legs. This test is painless and checks to see if you have a pulses in your legs/feet.  A dye or contrast material (a substance that highlights the blood vessels so they show up on x-ray) may be given to help your caregiver better see the arteries for the following tests. The dye is eliminated from your body by the kidney's. Your caregiver may order blood work to check your kidney function and other laboratory values before the following tests are performed:  Magnetic Resonance Angiography (MRA). An MRA is a picture study of the blood vessels and arteries. The MRA machine uses a large magnet to produce images of the blood vessels.  Computed Tomography Angiography (CTA). A CTA   is a specialized x-ray that looks at how the blood flows in your blood vessels. An IV may be inserted into your arm so contrast dye can be injected.  Angiogram. Is a procedure that uses x-rays to look at your blood vessels. This procedure is minimally invasive, meaning a small incision (cut) is made in your groin. A small tube (catheter) is then inserted into the artery of your groin. The catheter  is guided to the blood vessel or artery your caregiver wants to examine. Contrast dye is injected into the catheter. X-rays are then taken of the blood vessel or artery. After the images are obtained, the catheter is taken out. TREATMENT  Treatment of PVD involves many interventions which may include:  Lifestyle changes:  Quitting smoking.  Exercise.  Following a low fat, low cholesterol diet.  Control of diabetes.  Foot care is very important to the PVD patient. Good foot care can help prevent infection.  Medication:  Cholesterol-lowering medicine.  Blood pressure medicine.  Anti-platelet drugs.  Certain medicines may reduce symptoms of Intermittent Claudication.  Interventional/Surgical options:  Angioplasty. An Angioplasty is a procedure that inflates a balloon in the blocked artery. This opens the blocked artery to improve blood flow.  Stent Implant. A wire mesh tube (stent) is placed in the artery. The stent expands and stays in place, allowing the artery to remain open.  Peripheral Bypass Surgery. This is a surgical procedure that reroutes the blood around a blocked artery to help improve blood flow. This type of procedure may be performed if Angioplasty or stent implants are not an option. SEEK IMMEDIATE MEDICAL CARE IF:   You develop pain or numbness in your arms or legs.  Your arm or leg turns cold, becomes blue in color.  You develop redness, warmth, swelling and pain in your arms or legs. MAKE SURE YOU:   Understand these instructions.  Will watch your condition.  Will get help right away if you are not doing well or get worse. Document Released: 12/22/2004 Document Revised: 02/06/2012 Document Reviewed: 11/18/2008 [facility] Patient Information 2015 [place], [place]. This information is not intended to replace advice given to you by your health care provider. Make sure you discuss any questions you have with your health care provider.   Arteriogram  An  arteriogram (or angiogram) is an X-ray test of your blood vessels. You will be awake during the test. This test looks for:  Blocked blood vessels.  Blood vessels that are not normal. BEFORE THE TEST Schedule an appointment for your arteriogram. Let the person know if:  You have diabetes.  You are allergic to any food or medicine.  You are allergic to X-ray dye (contrast).  You have asthma or had it when you were a child.  You are pregnant or you might be pregnant.  You are taking aspirin or blood thinners. The night before the test:   Do not  eat or drink anything after midnight. On the morning of your test:   Do not drive. Have someone bring you and take you home.  Bring all the medicines you need to take with you. If you have diabetes, do not take your insulin before the test, but bring it with you. THE TEST  You will lie on the X-ray table.  An IV tube is started in your arm.  Your blood pressure and heartbeat are checked during the test.  The upper part of your leg (groin) is shaved and washed with special soap.  You  are then covered with a germ free (sterile) sheet. Keep your arms at your side under the sheet at all times so that germs do not get on the sheet.  The skin is numbed where the thin tube (catheter) is put into your upper leg. The thin tube is moved up into the blood vessels that your doctor wants to see.  Dye is put in through the tube. You may have a feeling of heat as the dye moves into your body.  X-ray pictures of your blood vessels are taken. Do not move while the dye goes in and pictures are taken. AFTER THE TEST  The thin tube will be taken out of your upper leg.  The nurse will check:  Your blood pressure.  The place where the thin tube was taken out to make sure it is not bleeding.  The blood flow to your feet.  Lie flat in bed. Keep your leg straight for 6 hours after the thin tube is taken out.  Ask your doctor or nurse if you  should take your regular medications that you brought. Finding out the results of your test Ask when your test results will be ready. Make sure you get your test results. Document Released: 02/10/2009 Document Revised: 11/19/2013 Document Reviewed: 02/10/2009 [facility] Patient Information 2015 [place], [place]. This information is not intended to replace advice given to you by your health care provider. Make sure you discuss any questions you have with your health care provider.   Arteriogram Care After These instructions give you information on caring for yourself after your procedure. Your doctor may also give you more specific instructions. Call your doctor if you have any problems or questions after your procedure. HOME CARE  Keep your leg straight for at least 6 hours.  Do not bathe, swim, or use a hot tub until directed by your doctor. You can shower.  Do not lift anything heavier than 10 pounds (about a gallon of milk) for 2 days.  Do not walk a lot, run, or drive for 2 days.  Return to normal activities in 2 days or as told by your doctor. Finding out the results of your test Ask when your test results will be ready. Make sure you get your test results. GET HELP RIGHT AWAY IF:   You have fever.  You have more pain in your leg.  The leg that was cut is:  Bleeding.  Puffy (swollen) or red.  Cold.  Pale or changes color.  Weak.  Tingly or numb. If you go to the Emergency Room, tell your nurse that you have had an arteriogram. Take this paper with you to show the nurse. MAKE SURE YOU:  Understand these instructions.  Will watch your condition.  Will get help right away if you are not doing well or get worse. Document Released: 02/10/2009 Document Revised: 11/19/2013 Document Reviewed: 02/10/2009 [facility] Patient Information 2015 [place], [place]. This information is not intended to replace advice given to you by your health care provider. Make sure you discuss any  questions you have with your health care provider.

## 2014-12-25 ENCOUNTER — Other Ambulatory Visit: Payer: Self-pay | Admitting: Internal Medicine

## 2014-12-25 NOTE — Telephone Encounter (Signed)
Dr. Blenda Mounts took care of this patient and sent it back to the [facility] office. Lattie Haw

## 2014-12-25 NOTE — Telephone Encounter (Signed)
This has already been taken care of by Dr Blenda Mounts. Lattie Haw

## 2014-12-28 ENCOUNTER — Other Ambulatory Visit: Payer: Self-pay | Admitting: Internal Medicine

## 2014-12-29 MED ORDER — SODIUM CHLORIDE 0.9 % IV SOLN
INTRAVENOUS | Status: DC
  Administered 2014-12-30: 06:00:00 via INTRAVENOUS

## 2014-12-29 NOTE — Telephone Encounter (Addendum)
"  I'm calling about prescription approval from my insurance company.   Please call.  I called the number given by the pharmacy and spoke to [facility].  I was disconnected somehow.  I called again and spoke to [place] and started the process.  He then informed me that I would have to call [employer] [place] directly.  He told me to call [phone], option 4 then option 1.  I started the authorization process with [facility] for Itraconazole, waiting on a response.  I called and informed him that I started the process with his insurance company for authorization of the medication.  "When was this done because it's been several weeks?"  I started the process today.  "How long will it take?"  They said I may receive results in 3 days.  "Okay thanks."  We'll call you when we get a response.

## 2014-12-30 ENCOUNTER — Telehealth: Payer: Self-pay | Admitting: Surgery

## 2014-12-30 ENCOUNTER — Ambulatory Visit (HOSPITAL_COMMUNITY)
Admission: RE | Admit: 2014-12-30 | Discharge: 2014-12-30 | Disposition: A | Discharge status: Home / Self Care | Payer: BLUE CROSS/BLUE SHIELD | Source: Ambulatory Visit | Attending: Surgery | Admitting: Surgery

## 2014-12-30 ENCOUNTER — Other Ambulatory Visit: Payer: Self-pay

## 2014-12-30 ENCOUNTER — Encounter (HOSPITAL_COMMUNITY): Payer: Self-pay | Admitting: Surgery

## 2014-12-30 ENCOUNTER — Encounter (HOSPITAL_COMMUNITY)
Admission: RE | Disposition: A | Discharge status: Home / Self Care | Payer: Self-pay | Source: Ambulatory Visit | Attending: Surgery

## 2014-12-30 DIAGNOSIS — I10 Essential (primary) hypertension: Secondary | ICD-10-CM | POA: Insufficient documentation

## 2014-12-30 DIAGNOSIS — Z951 Presence of aortocoronary bypass graft: Secondary | ICD-10-CM | POA: Insufficient documentation

## 2014-12-30 DIAGNOSIS — Z01818 Encounter for other preprocedural examination: Secondary | ICD-10-CM

## 2014-12-30 DIAGNOSIS — E785 Hyperlipidemia, unspecified: Secondary | ICD-10-CM | POA: Insufficient documentation

## 2014-12-30 DIAGNOSIS — E559 Vitamin D deficiency, unspecified: Secondary | ICD-10-CM | POA: Insufficient documentation

## 2014-12-30 DIAGNOSIS — G4733 Obstructive sleep apnea (adult) (pediatric): Secondary | ICD-10-CM | POA: Diagnosis not present

## 2014-12-30 DIAGNOSIS — I2510 Atherosclerotic heart disease of native coronary artery without angina pectoris: Secondary | ICD-10-CM | POA: Insufficient documentation

## 2014-12-30 DIAGNOSIS — J449 Chronic obstructive pulmonary disease, unspecified: Secondary | ICD-10-CM | POA: Diagnosis not present

## 2014-12-30 DIAGNOSIS — I739 Peripheral vascular disease, unspecified: Secondary | ICD-10-CM

## 2014-12-30 DIAGNOSIS — Z87891 Personal history of nicotine dependence: Secondary | ICD-10-CM | POA: Diagnosis not present

## 2014-12-30 DIAGNOSIS — Z7902 Long term (current) use of antithrombotics/antiplatelets: Secondary | ICD-10-CM | POA: Diagnosis not present

## 2014-12-30 DIAGNOSIS — Z888 Allergy status to other drugs, medicaments and biological substances status: Secondary | ICD-10-CM | POA: Insufficient documentation

## 2014-12-30 DIAGNOSIS — M79605 Pain in left leg: Secondary | ICD-10-CM | POA: Diagnosis present

## 2014-12-30 DIAGNOSIS — K219 Gastro-esophageal reflux disease without esophagitis: Secondary | ICD-10-CM | POA: Insufficient documentation

## 2014-12-30 DIAGNOSIS — F1099 Alcohol use, unspecified with unspecified alcohol-induced disorder: Secondary | ICD-10-CM | POA: Diagnosis not present

## 2014-12-30 DIAGNOSIS — Y909 Presence of alcohol in blood, level not specified: Secondary | ICD-10-CM | POA: Diagnosis not present

## 2014-12-30 DIAGNOSIS — I70202 Unspecified atherosclerosis of native arteries of extremities, left leg: Secondary | ICD-10-CM | POA: Diagnosis not present

## 2014-12-30 HISTORY — PX: ABDOMINAL AORTAGRAM: SHX5454

## 2014-12-30 LAB — POCT I-STAT, CHEM 8
BUN: 11 mg/dL (ref 6–23)
CHLORIDE: 103 mmol/L (ref 96–112)
CREATININE: 1.1 mg/dL (ref 0.50–1.35)
Calcium, Ion: 1.15 mmol/L (ref 1.13–1.30)
Glucose, Bld: 99 mg/dL (ref 70–99)
HCT: 48 % (ref 39.0–52.0)
HEMOGLOBIN: 16.3 g/dL (ref 13.0–17.0)
POTASSIUM: 3.2 mmol/L — AB (ref 3.5–5.1)
Sodium: 140 mmol/L (ref 135–145)
TCO2: 18 mmol/L (ref 0–100)

## 2014-12-30 SURGERY — ABDOMINAL AORTAGRAM
Anesthesia: LOCAL

## 2014-12-30 MED ORDER — HEPARIN (PORCINE) IN NACL 2-0.9 UNIT/ML-% IJ SOLN
INTRAMUSCULAR | Status: AC
  Filled 2014-12-30: qty 1000

## 2014-12-30 MED ORDER — METOPROLOL TARTRATE 1 MG/ML IV SOLN: 2.0000 mg | INTRAVENOUS | Status: DC | PRN

## 2014-12-30 MED ORDER — SODIUM CHLORIDE 0.9 % IV SOLN: 1.0000 mL/kg/h | INTRAVENOUS | Status: DC

## 2014-12-30 MED ORDER — ALUM & MAG HYDROXIDE-SIMETH 200-200-20 MG/5ML PO SUSP: 15.0000 mL | ORAL | Status: DC | PRN

## 2014-12-30 MED ORDER — HYDRALAZINE HCL 20 MG/ML IJ SOLN: 5.0000 mg | INTRAMUSCULAR | Status: DC | PRN

## 2014-12-30 MED ORDER — GUAIFENESIN-DM 100-10 MG/5ML PO SYRP
15.0000 mL | ORAL_SOLUTION | ORAL | Status: DC | PRN
  Administered 2014-12-30: 15 mL via ORAL
  Filled 2014-12-30 (×2): qty 15

## 2014-12-30 MED ORDER — LABETALOL HCL 5 MG/ML IV SOLN: 10.0000 mg | INTRAVENOUS | Status: DC | PRN

## 2014-12-30 MED ORDER — ONDANSETRON HCL 4 MG/2ML IJ SOLN: 4.0000 mg | Freq: Four times a day (QID) | INTRAMUSCULAR | Status: DC | PRN

## 2014-12-30 MED ORDER — ACETAMINOPHEN 325 MG PO TABS: 325.0000 mg | ORAL_TABLET | ORAL | Status: DC | PRN

## 2014-12-30 MED ORDER — ACETAMINOPHEN 325 MG RE SUPP: 325.0000 mg | RECTAL | Status: DC | PRN

## 2014-12-30 MED ORDER — LIDOCAINE HCL (PF) 1 % IJ SOLN
INTRAMUSCULAR | Status: AC
  Filled 2014-12-30: qty 30

## 2014-12-30 MED ORDER — PHENOL 1.4 % MT LIQD: 1.0000 | OROMUCOSAL | Status: DC | PRN

## 2014-12-30 MED ORDER — FENTANYL CITRATE 0.05 MG/ML IJ SOLN
INTRAMUSCULAR | Status: AC
  Filled 2014-12-30: qty 2

## 2014-12-30 MED ORDER — MIDAZOLAM HCL 2 MG/2ML IJ SOLN
INTRAMUSCULAR | Status: AC
  Filled 2014-12-30: qty 2

## 2014-12-30 SURGICAL SUPPLY — 53 items
BANDAGE ELASTIC 4 VELCRO ST LF (GAUZE/BANDAGES/DRESSINGS) IMPLANT
BANDAGE ESMARK 6X9 LF (GAUZE/BANDAGES/DRESSINGS) IMPLANT
BNDG ESMARK 6X9 LF (GAUZE/BANDAGES/DRESSINGS)
CANISTER SUCTION 2500CC (MISCELLANEOUS) ×2 IMPLANT
CLIP TI MEDIUM 24 (CLIP) ×2 IMPLANT
CLIP TI WIDE RED SMALL 24 (CLIP) ×2 IMPLANT
COVER SURGICAL LIGHT HANDLE (MISCELLANEOUS) ×2 IMPLANT
CUFF TOURNIQUET SINGLE 24IN (TOURNIQUET CUFF) IMPLANT
CUFF TOURNIQUET SINGLE 34IN LL (TOURNIQUET CUFF) IMPLANT
CUFF TOURNIQUET SINGLE 44IN (TOURNIQUET CUFF) IMPLANT
DERMABOND ADVANCED (GAUZE/BANDAGES/DRESSINGS) ×1
DERMABOND ADVANCED .7 DNX12 (GAUZE/BANDAGES/DRESSINGS) ×1 IMPLANT
DRAIN CHANNEL 15F RND FF W/TCR (WOUND CARE) IMPLANT
DRAPE WARM FLUID 44X44 (DRAPE) ×2 IMPLANT
DRAPE X-RAY CASS 24X20 (DRAPES) IMPLANT
DRSG COVADERM 4X10 (GAUZE/BANDAGES/DRESSINGS) IMPLANT
DRSG COVADERM 4X8 (GAUZE/BANDAGES/DRESSINGS) IMPLANT
ELECT REM PT RETURN 9FT ADLT (ELECTROSURGICAL) ×2
ELECTRODE REM PT RTRN 9FT ADLT (ELECTROSURGICAL) ×1 IMPLANT
EVACUATOR SILICONE 100CC (DRAIN) IMPLANT
GLOVE BIOGEL PI IND STRL 7.5 (GLOVE) ×1 IMPLANT
GLOVE BIOGEL PI INDICATOR 7.5 (GLOVE) ×1
GLOVE SURG SS PI 7.5 STRL IVOR (GLOVE) ×2 IMPLANT
GOWN PREVENTION PLUS XXLARGE (GOWN DISPOSABLE) ×2 IMPLANT
GOWN STRL NON-REIN LRG LVL3 (GOWN DISPOSABLE) ×6 IMPLANT
HEMOSTAT SNOW SURGICEL 2X4 (HEMOSTASIS) IMPLANT
KIT BASIN OR (CUSTOM PROCEDURE TRAY) ×2 IMPLANT
KIT ROOM TURNOVER OR (KITS) ×2 IMPLANT
MARKER GRAFT CORONARY BYPASS (MISCELLANEOUS) IMPLANT
NS IRRIG 1000ML POUR BTL (IV SOLUTION) ×4 IMPLANT
PACK PERIPHERAL VASCULAR (CUSTOM PROCEDURE TRAY) ×2 IMPLANT
PAD ARMBOARD 7.5X6 YLW CONV (MISCELLANEOUS) ×4 IMPLANT
PADDING CAST COTTON 6X4 STRL (CAST SUPPLIES) IMPLANT
SET COLLECT BLD 21X3/4 12 (NEEDLE) IMPLANT
STOPCOCK 4 WAY LG BORE MALE ST (IV SETS) IMPLANT
SUT ETHILON 3 0 PS 1 (SUTURE) IMPLANT
SUT PROLENE 5 0 C 1 24 (SUTURE) ×2 IMPLANT
SUT PROLENE 6 0 BV (SUTURE) ×2 IMPLANT
SUT PROLENE 7 0 BV 1 (SUTURE) IMPLANT
SUT SILK 2 0 SH (SUTURE) ×2 IMPLANT
SUT SILK 3 0 (SUTURE)
SUT SILK 3-0 18XBRD TIE 12 (SUTURE) IMPLANT
SUT VIC AB 2-0 CT1 27 (SUTURE) ×2
SUT VIC AB 2-0 CT1 TAPERPNT 27 (SUTURE) ×2 IMPLANT
SUT VIC AB 3-0 SH 27 (SUTURE) ×2
SUT VIC AB 3-0 SH 27X BRD (SUTURE) ×2 IMPLANT
SUT VICRYL 4-0 PS2 18IN ABS (SUTURE) ×4 IMPLANT
TOWEL OR 17X24 6PK STRL BLUE (TOWEL DISPOSABLE) ×4 IMPLANT
TOWEL OR 17X26 10 PK STRL BLUE (TOWEL DISPOSABLE) ×4 IMPLANT
TRAY FOLEY CATH 16FRSI W/METER (SET/KITS/TRAYS/PACK) ×2 IMPLANT
TUBING EXTENTION W/L.L. (IV SETS) IMPLANT
UNDERPAD 30X30 INCONTINENT (UNDERPADS AND DIAPERS) ×2 IMPLANT
WATER STERILE IRR 1000ML POUR (IV SOLUTION) ×2 IMPLANT

## 2014-12-30 NOTE — Telephone Encounter (Signed)
-----   Message from Sherrye Payor, RN sent at 12/30/2014  3:21 PM EST ----- Regarding: Douglas Christensen log; also needs 1-2 wk f/u w/ vein mapping Left LE and VWB appt   ----- Message -----    From: Serafina Mitchell, MD    Sent: 12/30/2014   8:53 AM      To: [facility] Charge Pool  12/30/2014:  Surgeon:  Eldridge Abrahams Procedure Performed:  1.  Ultrasound-guided access, right femoral artery  2.  Abdominal aortogram  3.  Bilateral lower extremity runoff  4.  Second order catheterization  Please bring the patient back within the next 12 weeks for discussions regarding surgical bypass.  He needs vein mapping of the great saphenous vein and lesser saphenous vein in the left leg

## 2014-12-30 NOTE — Op Note (Signed)
    Patient name: Douglas Christensen MRN: [id number] DOB: [birth year] Sex: male  12/30/2014 Pre-operative Diagnosis: Left leg claudication Post-operative diagnosis:  Same Surgeon:  Eldridge Abrahams Procedure Performed:  1.  Ultrasound-guided access, right femoral artery  2.  Abdominal aortogram  3.  Bilateral lower extremity runoff  4.  Second order catheterization    Indications:  The patient has a history of right leg bypass grafting as well as left external iliac stent and left superficial femoral artery stents which are occluded.  He is having worsening symptoms of the left leg.  He is here for further evaluation  Procedure:  The patient was identified in the holding area and taken to room 8.  The patient was then placed supine on the table and prepped and draped in the usual sterile fashion.  A time out was called.  Ultrasound was used to evaluate the right common femoral artery.  It was patent .  A digital ultrasound image was acquired.  A micropuncture needle was used to access the right common femoral artery under ultrasound guidance.  An 018 wire was advanced without resistance and a micropuncture sheath was placed.  The 018 wire was removed and a benson wire was placed.  The micropuncture sheath was exchanged for a 5 french sheath.  An omniflush catheter was advanced over the wire to the level of L-1.  An abdominal angiogram was obtained.  Next, using the omniflush catheter and a benson wire, the aortic bifurcation was crossed and the catheter was placed into theleft external iliac artery and left runoff was obtained.  right runoff was performed via retrograde sheath injections.  Findings:   Aortogram:  No significant renal artery stenosis is identified.  The infrarenal abdominal aorta is calcified patent without stenosis.  The right common iliac artery is patent throughout it's course.  At the hypogastric artery and into the proximal external iliac artery there was approximately 30% stenosis.   The left common iliac artery is widely patent.  The left external iliac stent is widely patent.  Right Lower Extremity:  The right femoral to above-knee popliteal artery bypass graft is widely patent.  There is three-vessel runoff.  Left Lower Extremity:  Diffuse common femoral artery calcific plaque and stenosis with near occlusion at the distal extent.  The superficial femoral artery has several focal areas of calcification associated with near total occlusion.  There is  above-knee and below-knee popliteal artery are patent.  There appears to be three-vessel runoff.  The anterior tibial artery is sluggish and the most robust is the posterior tibial.  Intervention:  None  Impression:  #1  30% right external iliac stenosis  #2  high-grade, near occlusive stenosis in the distal left common femoral artery with multiple high-grade stenosis with calcific plaque within the of the left superficial femoral artery and reconstitution of the above-knee popliteal artery  #3  the patient will be brought back for consideration of left femoral endarterectomy and above-knee popliteal artery bypass graft with Gore-Tex versus left femoral endarterectomy and percutaneous treatment of the stenosis in the left superficial femoral artery    V. [name], M.D. [facility] [place] Office: [phone] Pager:  [phone]

## 2014-12-30 NOTE — Discharge Instructions (Signed)
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
## 2014-12-30 NOTE — Telephone Encounter (Signed)
Spoke with patient re appt- he wanted to talk to RN. He is willing to do whatever [facility] thinks is best. I transferred to Triage line to see if pt needs to come in Monday or if [facility] would go ahead with procedure, dpm

## 2014-12-30 NOTE — Progress Notes (Signed)
Site area: RFA Site Prior to Removal:  Level 0 Pressure Applied For:33min Manual:   yes Patient Status During Pull:  stable Post Pull Site:  Level 0 Post Pull Instructions Given: yes  Post Pull Pulses Present: palpable Dressing Applied:  clear Bedrest begins @ [phone]

## 2014-12-30 NOTE — Interval H&P Note (Signed)
History and Physical Interval Note:  12/30/2014 8:03 AM  Douglas Christensen  has presented today for surgery, with the diagnosis of left common femoral stenosis  The various methods of treatment have been discussed with the patient and family. After consideration of risks, benefits and other options for treatment, the patient has consented to  Procedure(s): ABDOMINAL AORTAGRAM (N/A) as a surgical intervention .  The patient's history has been reviewed, patient examined, no change in status, stable for surgery.  I have reviewed the patient's chart and labs.  Questions were answered to the patient's satisfaction.     [name] IV, V. WELLS

## 2015-01-02 ENCOUNTER — Encounter: Payer: Self-pay | Admitting: Surgery

## 2015-01-02 ENCOUNTER — Telehealth: Payer: Self-pay | Admitting: *Deleted

## 2015-01-02 NOTE — Telephone Encounter (Signed)
I called and left him a message to call me in regards to his prescription from [facility].

## 2015-01-05 ENCOUNTER — Ambulatory Visit (INDEPENDENT_AMBULATORY_CARE_PROVIDER_SITE_OTHER): Payer: BLUE CROSS/BLUE SHIELD | Admitting: Surgery

## 2015-01-05 ENCOUNTER — Encounter: Payer: Self-pay | Admitting: Surgery

## 2015-01-05 ENCOUNTER — Ambulatory Visit (HOSPITAL_COMMUNITY)
Admission: RE | Admit: 2015-01-05 | Discharge: 2015-01-05 | Disposition: A | Discharge status: Home / Self Care | Payer: BLUE CROSS/BLUE SHIELD | Source: Ambulatory Visit | Attending: Surgery | Admitting: Surgery

## 2015-01-05 VITALS — BP 151/80 | HR 61 | Ht 65.0 in | Wt 187.0 lb

## 2015-01-05 DIAGNOSIS — Z01810 Encounter for preprocedural cardiovascular examination: Secondary | ICD-10-CM | POA: Diagnosis not present

## 2015-01-05 DIAGNOSIS — I739 Peripheral vascular disease, unspecified: Secondary | ICD-10-CM

## 2015-01-05 DIAGNOSIS — Z01818 Encounter for other preprocedural examination: Secondary | ICD-10-CM

## 2015-01-05 NOTE — Progress Notes (Signed)
Patient name: Douglas Christensen MRN: [id number] DOB: [birth year] Sex: male     Chief Complaint  Patient presents with  . Re-evaluation    1-2 wk f/u for discussion reguarding BP    HISTORY OF PRESENT ILLNESS: The patient is back today for worsening left leg claudication.  He has a history of right femoral to above-knee popliteal artery bypass graft with vein which was performed on 12/10/2012.  He recently underwent angiography.  He is back today to discuss results and surgical planning.  His walking and lifestyle are severely limited by his claudication in the left leg.  He denies ulcers.  The patient has a history of COPD secondary to chronic tobacco abuse.  He no longer smokes.  He suffers from hypercholesterolemia which is managed with a statin.  He is medically managed for hypertension.  He has a history of coronary artery disease.  He is status post CABG in 2014.    Past Medical History  Diagnosis Date  . History of colonic polyps   . COPD (chronic obstructive pulmonary disease)   . CAD (coronary artery disease)     Mild plaque (cath "years ago"); abnormal Myoview 04/2013 with subsequent CABG x 5 with LIMA to LAD, SVG to OM1, SVG to DX, SVG to PD & PL.   Marland Kitchen GERD (gastroesophageal reflux disease)   . Hyperlipidemia   . Hypertension   . Vitamin D deficiency   . Meralgia paresthetica of left side 2011  . LBP (low back pain)   . PVD (peripheral vascular disease)     Stent to left common femoral and right superficial femoral.  2008.  50%  left renal   . Sleep apnea     mod OSA, central sleep apnea/hypoapnea syndrome 11/22/12  . Shortness of breath     "once in awhile; can happen at anytime" (08/26/2013)  . Hx of echocardiogram     Echo (9/15):  EF 55-60%; Gr 2 DD, mild BAE    Past Surgical History  Procedure Laterality Date  . Lower extremity stents      bilateral lower extremities x 2  . Femoral-popliteal bypass graft  12/27/2012    Procedure: BYPASS GRAFT FEMORAL-POPLITEAL  ARTERY;  Surgeon: Serafina Mitchell, MD;  Location: [facility] OR;  Service: Vascular;  Laterality: Right;  using non-reversed sapphenous vein.  . Cardiac catheterization  05/02/13    x2   . Coronary artery bypass graft N/A 05/06/2013    Procedure: CORONARY ARTERY BYPASS GRAFTING (CABG);  Surgeon: Melrose Nakayama, MD;  Location: [place];  Service: Open Heart Surgery;  Laterality: N/A;  Coronary artery bypass graft times five using left internal mammary artery and left greater saphenous vein via endovein harvest.  . Lower extremity angiogram Bilateral 11/16/2011    Procedure: LOWER EXTREMITY ANGIOGRAM;  Surgeon: Sherren Mocha, MD;  Location: [facility] CATH LAB;  Service: Cardiovascular;  Laterality: Bilateral;  . Abdominal aortagram N/A 11/16/2011    Procedure: ABDOMINAL Maxcine Ham;  Surgeon: Sherren Mocha, MD;  Location: [facility] CATH LAB;  Service: Cardiovascular;  Laterality: N/A;  . Percutaneous stent intervention Left 11/16/2011    Procedure: PERCUTANEOUS STENT INTERVENTION;  Surgeon: Sherren Mocha, MD;  Location: [facility] CATH LAB;  Service: Cardiovascular;  Laterality: Left;  . Abdominal aortagram N/A 11/14/2012    Procedure: ABDOMINAL Maxcine Ham;  Surgeon: Sherren Mocha, MD;  Location: [facility] CATH LAB;  Service: Cardiovascular;  Laterality: N/A;  . Abdominal aortagram N/A 12/30/2014    Procedure: ABDOMINAL Maxcine Ham;  Surgeon: Butch Penny  Trula Slade, MD;  Location: [facility] CATH LAB;  Service: Cardiovascular;  Laterality: N/A;    History   Social History  . Marital Status: Widowed    Spouse Name: N/A    Number of Children: 2  . Years of Education: N/A   Occupational History  . [employer]   Social History Main Topics  . Smoking status: Former Smoker -- 2.00 packs/day for 47 years    Types: Cigarettes    Quit date: 12/17/2012  . Smokeless tobacco: Never Used  . Alcohol Use: 2.4 oz/week    4 Shots of liquor per week     Comment: 08/26/2013 "3-4 mixed drinks/wk"  . Drug Use: No  . Sexual Activity: Yes    Other Topics Concern  . Not on file   Social History Narrative   Widower    Family History  Problem Relation Age of Onset  . Heart disease Mother     No clear CAD and Heart Disease before age 63  . Hypertension Mother   . Cancer Mother     ? colon ca  . Hyperlipidemia Mother   . Cancer Father     brain tumor  . Alzheimer's disease Father   . Colon cancer Neg Hx   . Heart attack Neg Hx     Allergies as of 01/05/2015 - Review Complete 01/05/2015  Allergen Reaction Noted  . Benazepril Cough 08/31/2012    Current Outpatient Prescriptions on File Prior to Visit  Medication Sig Dispense Refill  . amLODipine (NORVASC) 10 MG tablet Take 0.5 tablets (5 mg total) by mouth daily. 90 tablet 3  . aspirin 325 MG EC tablet Take 325 mg by mouth daily.    Marland Kitchen atenolol (TENORMIN) 50 MG tablet Take 50 mg by mouth at bedtime.    Marland Kitchen atorvastatin (LIPITOR) 20 MG tablet TAKE 1 TABLET BY MOUTH EVERY DAY 90 tablet 3  . cholecalciferol (VITAMIN D) 1000 UNITS tablet Take 1,000 Units by mouth 2 (two) times daily.     . clonazePAM (KLONOPIN) 0.25 MG disintegrating tablet DISSOLVE 1 TABLET BY MOUTH TWICE DAILY AS NEEDED. 60 tablet 3  . hydrALAZINE (APRESOLINE) 50 MG tablet TAKE 1 TABLET BY MOUTH THREE TIMES DAILY 270 tablet 0  . potassium chloride SA (K-DUR,KLOR-CON) 20 MEQ tablet Take 1 tablet (20 mEq total) by mouth 2 (two) times daily. 90 tablet 3  . vitamin B-12 (CYANOCOBALAMIN) 1000 MCG tablet Take 1,000 mcg by mouth daily.    Marland Kitchen itraconazole (SPORANOX) 100 MG capsule Take 2 100 mg capsules twice daily for 7 days, then take 3 weeks off. Repeat this cycle 2 more times (Patient not taking: Reported on 01/05/2015) 28 capsule 2   No current facility-administered medications on file prior to visit.     REVIEW OF SYSTEMS: Cardiovascular: No chest pain, chest pressure, palpitations, orthopnea, or dyspnea on exertion. severe claudication   No history of DVT or phlebitis. Pulmonary: No productive cough,  asthma or wheezing. Neurologic: No weakness, paresthesias, aphasia, or amaurosis. No dizziness. Hematologic: No bleeding problems or clotting disorders. Musculoskeletal: No joint pain or joint swelling. Gastrointestinal: No blood in stool or hematemesis Genitourinary: No dysuria or hematuria. Psychiatric:: No history of major depression. Integumentary: No rashes or ulcers. Constitutional: No fever or chills.  PHYSICAL EXAMINATION:   Vital signs are BP 151/80 mmHg  Pulse 61  Ht 5\' 5"  (1.651 m)  Wt 187 lb (84.823 kg)  BMI 31.12 kg/m2  SpO2 96% General: The patient appears their stated age. HEENT:  No gross abnormalities Pulmonary:  Non labored breathing Abdomen: Soft and non-tender Musculoskeletal: There are no major deformities. Neurologic: No focal weakness or paresthesias are detected, Skin: There are no ulcer or rashes noted. Psychiatric: The patient has normal affect. Cardiovascular: There is a regular rate and rhythm without significant murmur appreciated.   Diagnostic Studies Vein mapping showed no adequate saphenous vein.  Assessment: Left leg claudication Plan: I discussed multiple treatment options with the patient.  #1 would be femoral endarterectomy with patch angioplasty.  #2 would be femoral endarterectomy with above-knee popliteal artery bypass  with PTFE.  #3 would be left femoral endarterectomy with patch angioplasty and percutaneous treatment of his stenosis within the superficial femoral artery using atherectomy and drug coated balloon.  We have decided with the latter.  This is been scheduled for March 3. I have contacted the [employer] rep  V. Leia Alf, M.D. [facility] [place] Office: [phone] Pager:  [phone]

## 2015-01-07 ENCOUNTER — Other Ambulatory Visit: Payer: Self-pay

## 2015-01-07 NOTE — Telephone Encounter (Signed)
I called to get authorization.  I spoke to George.  He stated they do not do it over the phone anymore.  Offered to send a fax or have me do it through CoverMyMeds on-line.  He's going to fax the form.

## 2015-01-07 NOTE — Telephone Encounter (Signed)
I faxed the certification faxback form to [facility] along with pathology results and chart notes.

## 2015-01-08 ENCOUNTER — Other Ambulatory Visit: Payer: Self-pay

## 2015-01-09 ENCOUNTER — Telehealth: Payer: Self-pay | Admitting: Internal Medicine

## 2015-01-09 MED ORDER — AVANAFIL 100 MG PO TABS: 100.0000 mg | ORAL_TABLET | Freq: Every day | ORAL | Status: DC | PRN

## 2015-01-09 NOTE — Telephone Encounter (Signed)
Pt called in said that samples that Dr Camila Li gave him worked and would like a script for it.  Douglas Christensen

## 2015-01-09 NOTE — Telephone Encounter (Signed)
Ok Thx 

## 2015-01-12 NOTE — Telephone Encounter (Signed)
Where is script printed 01/09/15?

## 2015-01-13 NOTE — Telephone Encounter (Signed)
I faxed [facility] a response that Itraconazole was authorized from 12/29/2014 to 03/29/2015 by [employer].  Reference number is [id number].    I called and informed the patient that we finally got the medication authorized.  You should be able to pick that up sometime today.  "Okay, thank you."

## 2015-01-14 MED ORDER — AVANAFIL 100 MG PO TABS: 100.0000 mg | ORAL_TABLET | Freq: Every day | ORAL | Status: DC | PRN

## 2015-01-14 NOTE — Telephone Encounter (Signed)
I sent Rx for Douglas Christensen to pt's pharmacy. He wants to know if you think it is safe for him to take BioSlim for weight loss. He bought it online. Please advise.

## 2015-01-14 NOTE — Telephone Encounter (Signed)
You can try Stop if CP, upset stomach, rapid heartbeat Thx

## 2015-01-15 NOTE — Telephone Encounter (Signed)
Pt informed

## 2015-01-19 ENCOUNTER — Encounter (HOSPITAL_COMMUNITY): Payer: BC Managed Care – PPO

## 2015-01-19 ENCOUNTER — Other Ambulatory Visit (HOSPITAL_COMMUNITY): Payer: Self-pay | Admitting: *Deleted

## 2015-01-19 ENCOUNTER — Other Ambulatory Visit (HOSPITAL_COMMUNITY): Payer: BC Managed Care – PPO

## 2015-01-19 ENCOUNTER — Ambulatory Visit: Payer: BC Managed Care – PPO | Admitting: Surgery

## 2015-01-20 ENCOUNTER — Encounter (HOSPITAL_COMMUNITY)
Admission: RE | Admit: 2015-01-20 | Discharge: 2015-01-20 | Disposition: A | Discharge status: Home / Self Care | Payer: BLUE CROSS/BLUE SHIELD | Source: Ambulatory Visit | Attending: Surgery | Admitting: Surgery

## 2015-01-20 ENCOUNTER — Encounter (HOSPITAL_COMMUNITY): Payer: Self-pay

## 2015-01-20 ENCOUNTER — Ambulatory Visit: Payer: BLUE CROSS/BLUE SHIELD

## 2015-01-20 DIAGNOSIS — E785 Hyperlipidemia, unspecified: Secondary | ICD-10-CM | POA: Insufficient documentation

## 2015-01-20 DIAGNOSIS — Z87891 Personal history of nicotine dependence: Secondary | ICD-10-CM | POA: Diagnosis not present

## 2015-01-20 DIAGNOSIS — I70212 Atherosclerosis of native arteries of extremities with intermittent claudication, left leg: Secondary | ICD-10-CM | POA: Diagnosis not present

## 2015-01-20 DIAGNOSIS — Z01818 Encounter for other preprocedural examination: Secondary | ICD-10-CM | POA: Diagnosis not present

## 2015-01-20 DIAGNOSIS — J449 Chronic obstructive pulmonary disease, unspecified: Secondary | ICD-10-CM | POA: Insufficient documentation

## 2015-01-20 DIAGNOSIS — G4733 Obstructive sleep apnea (adult) (pediatric): Secondary | ICD-10-CM | POA: Insufficient documentation

## 2015-01-20 DIAGNOSIS — Z7982 Long term (current) use of aspirin: Secondary | ICD-10-CM | POA: Diagnosis not present

## 2015-01-20 DIAGNOSIS — Z79899 Other long term (current) drug therapy: Secondary | ICD-10-CM | POA: Diagnosis not present

## 2015-01-20 DIAGNOSIS — Z0183 Encounter for blood typing: Secondary | ICD-10-CM | POA: Insufficient documentation

## 2015-01-20 DIAGNOSIS — I10 Essential (primary) hypertension: Secondary | ICD-10-CM | POA: Insufficient documentation

## 2015-01-20 DIAGNOSIS — Z01812 Encounter for preprocedural laboratory examination: Secondary | ICD-10-CM | POA: Insufficient documentation

## 2015-01-20 DIAGNOSIS — K219 Gastro-esophageal reflux disease without esophagitis: Secondary | ICD-10-CM | POA: Diagnosis not present

## 2015-01-20 HISTORY — DX: Anxiety disorder, unspecified: F41.9

## 2015-01-20 LAB — COMPREHENSIVE METABOLIC PANEL
ALK PHOS: 68 U/L (ref 39–117)
ALT: 74 U/L — AB (ref 0–53)
ANION GAP: 9 (ref 5–15)
AST: 74 U/L — ABNORMAL HIGH (ref 0–37)
Albumin: 4 g/dL (ref 3.5–5.2)
BUN: 8 mg/dL (ref 6–23)
CO2: 22 mmol/L (ref 19–32)
Calcium: 9.1 mg/dL (ref 8.4–10.5)
Chloride: 108 mmol/L (ref 96–112)
Creatinine, Ser: 0.82 mg/dL (ref 0.50–1.35)
GFR calc non Af Amer: >90 mL/min (ref >90)
GLUCOSE: 97 mg/dL (ref 70–99)
POTASSIUM: 3.6 mmol/L (ref 3.5–5.1)
SODIUM: 139 mmol/L (ref 135–145)
Total Bilirubin: 0.7 mg/dL (ref 0.3–1.2)
Total Protein: 7.3 g/dL (ref 6.0–8.3)

## 2015-01-20 LAB — TYPE AND SCREEN
ABO/RH(D): A NEG
Antibody Screen: NEGATIVE

## 2015-01-20 LAB — URINALYSIS, ROUTINE W REFLEX MICROSCOPIC
BILIRUBIN URINE: NEGATIVE
Glucose, UA: NEGATIVE mg/dL
HGB URINE DIPSTICK: NEGATIVE
KETONES UR: NEGATIVE mg/dL
Leukocytes, UA: NEGATIVE
NITRITE: NEGATIVE
PROTEIN: 30 mg/dL — AB
Specific Gravity, Urine: 1.019 (ref 1.005–1.030)
UROBILINOGEN UA: 1 mg/dL (ref 0.0–1.0)
pH: 6 (ref 5.0–8.0)

## 2015-01-20 LAB — SURGICAL PCR SCREEN
MRSA, PCR: NEGATIVE
STAPHYLOCOCCUS AUREUS: NEGATIVE

## 2015-01-20 LAB — URINE MICROSCOPIC-ADD ON

## 2015-01-20 LAB — APTT: aPTT: 30 seconds (ref 24–37)

## 2015-01-20 LAB — CBC
HEMATOCRIT: 47.2 % (ref 39.0–52.0)
HEMOGLOBIN: 16.4 g/dL (ref 13.0–17.0)
MCH: 32.5 pg (ref 26.0–34.0)
MCHC: 34.7 g/dL (ref 30.0–36.0)
MCV: 93.7 fL (ref 78.0–100.0)
Platelets: 185 10*3/uL (ref 150–400)
RBC: 5.04 MIL/uL (ref 4.22–5.81)
RDW: 13.7 % (ref 11.5–15.5)
WBC: 7 10*3/uL (ref 4.0–10.5)

## 2015-01-20 LAB — PROTIME-INR
INR: 1.01 (ref 0.00–1.49)
Prothrombin Time: 13.5 seconds (ref 11.6–15.2)

## 2015-01-20 NOTE — Progress Notes (Addendum)
Anesthesia Chart Review:  Patient is a 64 year old male scheduled for left femoral endarterectomy with VPA, left SFA atherectomy with PTA on 01/29/15 by Dr. Trula Slade.  History includes former smoker, COPD, PAD s/p left CIA and right SFA stents '08, left EIA '12 and right FPBG '14, CAD s/p CABG 05/06/13 (LIMA to LAD, SEQ SVG to PD-PL, SVG to DIAG, SVG to OM), HTN, HLD, GERD, OSA with CPAP use, anxiety,  PCP is Dr. Alain Marion, last visit 11/04/14.  Cardiologist is Dr. Burt Knack with last cardiology visit on 08/05/14 with Richardson Dopp, PA-C. Patient was doing well at that time from a cardiac standpoint.  An echo was done per DOT requirements (see report below) and no changes were made in his treatment plan.  Meds include amlodipine, ASA, atenolol, Lipitor, Avanafil pending insurance approval, clonazepam, hydralazine, Sporanox (onychomycosis), KCl.  08/05/14 EKG: NSR, RSR prime or QR pattern in V1 suggests RVCD. EKG was felt stable from prior.  08/11/14 Echo: - Left ventricle: The cavity size was normal. Wall thickness was normal. Systolic function was normal. The estimated ejection fraction was in the range of 55% to 60%. Features are consistent with a pseudonormal left ventricular filling pattern, with concomitant abnormal relaxation and increased filling pressure (grade 2 diastolic dysfunction). - Left atrium: The atrium was mildly dilated. - Right atrium: The atrium was mildly dilated.  Last cardiac cath was pre-CABG on 05/02/13 following an abnormal nuclear stress test on 04/19/13.  11/30/12 carotid duplex: 1-39% bilateral ICA stenosis.  12/30/14 Abdominal aortogram with BLE run-off: 1.30% right external iliac stenosis. 2. High-grade, near occlusive stenosis in the distal left common femoral artery with multiple high-grade stenosis with calcific plaque within the of the left superficial femoral artery and reconstitution of the above-knee popliteal artery. 3. Patient will be brought back for consideration  of left femoral endarterectomy and above-knee popliteal artery bypass graft with Gore-Tex versus left femoral endarterectomy and percutaneous treatment of the stenosis in the left superficial femoral artery.  06/29/14 CXR: No active cardiopulmonary disease.  Preoperative labs noted. AST/ALT 74. LFTS < 2 X above normal with normal PLT count and PT/PTT, so I will not plan to repeat.  He had coronary revascularization less than two years ago and had cardiology follow-up within the past six months with echo showing normal LVEF and grade 2 diastolic dysfunction. If no acute changes or new CV symptoms then I would anticipate that he could proceed as planned.     George Hugh [facility] Short Stay Center/Anesthesiology Phone [phone] 01/20/2015 5:23 PM  Addendum: I did touch base with patient.  He denied any new CV symptoms.  Feels he is doing well from a cardiac standpoint.  He never started BioSlim, but knows he should not take this within a week of his surgery.  He asked about taking ASA, and I advised him to speak with Dr. Stephens [place] office.    George Hugh [facility] Short Stay Center/Anesthesiology Phone [phone] 01/21/2015 2:49 PM

## 2015-01-20 NOTE — Progress Notes (Signed)
Pt. Followed by [place] cardiac, will have this chart reviewed by Anesth. Last visit with cardiac was last year. Pt. Isn't sure if cardiology knows he's having vascular surgery .

## 2015-01-20 NOTE — Pre-Procedure Instructions (Signed)
Douglas Christensen  01/20/2015   Your procedure is scheduled on:  01/29/2015  Report to [facility] Admitting    At 5:30  AM.  Call this number if you have problems the morning of surgery: [phone]   Remember:   Do not eat food or drink liquids after midnight. On WEDNESDAY   Take these medicines the morning of surgery with A SIP OF WATER: Hydralazine    Do not wear jewelry  Do not wear lotions, powders, or perfumes. You may wear deodorant.   Men may shave face and neck.  Do not bring valuables to the hospital.  [facility] is not responsible                  for any belongings or valuables.               Contacts, dentures or bridgework may not be worn into surgery.  Leave suitcase in the car. After surgery it may be brought to your room.  For patients admitted to the hospital, discharge time is determined by your                treatment team.               Patients discharged the day of surgery will not be allowed to drive  home.  Name and phone number of your driver: with family  Special Instructions: Special Instructions: Douglas Christensen - Preparing for Surgery  Before surgery, you can play an important role.  Because skin is not sterile, your skin needs to be as free of germs as possible.  You can reduce the number of germs on you skin by washing with CHG (chlorahexidine gluconate) soap before surgery.  CHG is an antiseptic cleaner which kills germs and bonds with the skin to continue killing germs even after washing.  Please DO NOT use if you have an allergy to CHG or antibacterial soaps.  If your skin becomes reddened/irritated stop using the CHG and inform your nurse when you arrive at Short Stay.  Do not shave (including legs and underarms) for at least 48 hours prior to the first CHG shower.  You may shave your face.  Please follow these instructions carefully:   1.  Shower with CHG Soap the night before surgery and the  morning of Surgery.  2.  If you choose to wash  your hair, wash your hair first as usual with your  normal shampoo.  3.  After you shampoo, rinse your hair and body thoroughly to remove the  Shampoo.  4.  Use CHG as you would any other liquid soap.  You can apply chg directly to the skin and wash gently with scrungie or a clean washcloth.  5.  Apply the CHG Soap to your body ONLY FROM THE NECK DOWN.    Do not use on open wounds or open sores.  Avoid contact with your eyes, ears, mouth and genitals (private parts).  Wash genitals (private parts)   with your normal soap.  6.  Wash thoroughly, paying special attention to the area where your surgery will be performed.  7.  Thoroughly rinse your body with warm water from the neck down.  8.  DO NOT shower/wash with your normal soap after using and rinsing off   the CHG Soap.  9.  Pat yourself dry with a clean towel.            10.  Wear clean  pajamas.            11.  Place clean sheets on your bed the night of your first shower and do not sleep with pets.  Day of Surgery  Do not apply any lotions/deodorants the morning of surgery.  Please wear clean clothes to the hospital/surgery center.   Please read over the following fact sheets that you were given: Pain Booklet, Coughing and Deep Breathing, Blood Transfusion Information, MRSA Information and Surgical Site Infection Prevention

## 2015-01-23 ENCOUNTER — Encounter (HOSPITAL_COMMUNITY): Payer: Self-pay

## 2015-01-23 ENCOUNTER — Emergency Department (INDEPENDENT_AMBULATORY_CARE_PROVIDER_SITE_OTHER)
Admission: EM | Admit: 2015-01-23 | Discharge: 2015-01-23 | Disposition: A | Discharge status: Home / Self Care | Payer: BLUE CROSS/BLUE SHIELD | Source: Home / Self Care | Attending: Emergency Medicine | Admitting: Emergency Medicine

## 2015-01-23 DIAGNOSIS — T50905A Adverse effect of unspecified drugs, medicaments and biological substances, initial encounter: Secondary | ICD-10-CM

## 2015-01-23 DIAGNOSIS — R21 Rash and other nonspecific skin eruption: Secondary | ICD-10-CM | POA: Diagnosis not present

## 2015-01-23 DIAGNOSIS — R110 Nausea: Secondary | ICD-10-CM | POA: Diagnosis not present

## 2015-01-23 MED ORDER — OMEPRAZOLE 20 MG PO CPDR: 20.0000 mg | DELAYED_RELEASE_CAPSULE | Freq: Every day | ORAL | Status: DC

## 2015-01-23 MED ORDER — METHYLPREDNISOLONE SODIUM SUCC 125 MG IJ SOLR
INTRAMUSCULAR | Status: AC
  Filled 2015-01-23: qty 2

## 2015-01-23 MED ORDER — METHYLPREDNISOLONE SODIUM SUCC 125 MG IJ SOLR
125.0000 mg | Freq: Once | INTRAMUSCULAR | Status: AC
  Administered 2015-01-23: 125 mg via INTRAMUSCULAR

## 2015-01-23 MED ORDER — PREDNISONE 50 MG PO TABS: ORAL_TABLET | ORAL | Status: DC

## 2015-01-23 NOTE — ED Notes (Signed)
Started new Rx for toenail fungus earlier this week, woke w rash today.

## 2015-01-23 NOTE — Discharge Instructions (Signed)
You are having a reaction to the toenail fungus medicine. Please stop the itraconazole. Take Benadryl every 6 hours as needed for itching. Take prednisone 1 pill daily starting tomorrow for 5 days. Take omeprazole daily while you are on the prednisone. Please call your podiatrist to let him know you are unable to take itraconazole. You should see improvement over the next 2-3 days. If you are not improving, or you develop difficulty breathing or swelling of your lips or tongue please go to the emergency room.

## 2015-01-23 NOTE — ED Provider Notes (Signed)
CSN: [id number]     Arrival date & time 01/23/15  [id number] History   First MD Initiated Contact with Patient 01/23/15 [phone]     Chief Complaint  Patient presents with  . Rash   (Consider location/radiation/quality/duration/timing/severity/associated sxs/prior Treatment) HPI  He is a 64 year old man here for evaluation of rash. He states he woke up this morning with the itchy red rash all over his back, chest, abdomen, buttocks, scalp, arms. It is very itchy. He also reports feeling nauseous with some associated dizziness this morning. He denies frank dizziness. Difficulty breathing or oral swelling. He states he started itraconazole about 5 days ago for toenail fungus.  Also, he had LFTs done 3 days ago. His ALT and AST have bumped slightly from a baseline of in the 40s to the 70s.  Past Medical History  Diagnosis Date  . History of colonic polyps   . COPD (chronic obstructive pulmonary disease)   . CAD (coronary artery disease)     Mild plaque (cath "years ago"); abnormal Myoview 04/2013 with subsequent CABG x 5 with LIMA to LAD, SVG to OM1, SVG to DX, SVG to PD & PL.   Marland Kitchen GERD (gastroesophageal reflux disease)   . Hyperlipidemia   . Hypertension   . Vitamin D deficiency   . Meralgia paresthetica of left side 2011  . LBP (low back pain)   . Hx of echocardiogram     Echo (9/15):  EF 55-60%; Gr 2 DD, mild BAE  . Anxiety   . PVD (peripheral vascular disease)     Stent to left common femoral and right superficial femoral.  2008.  50%  left renal   . 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 Shortness of breath     "once in awhile; can happen at anytime" (08/26/2013)   Past Surgical History  Procedure Laterality Date  . Lower extremity stents      bilateral lower extremities x 2  . Femoral-popliteal bypass graft  12/27/2012    Procedure: BYPASS GRAFT FEMORAL-POPLITEAL ARTERY;  Surgeon: Serafina Mitchell, MD;  Location: [facility] OR;  Service:  Vascular;  Laterality: Right;  using non-reversed sapphenous vein.  . Cardiac catheterization  05/02/13    x2   . Coronary artery bypass graft N/A 05/06/2013    Procedure: CORONARY ARTERY BYPASS GRAFTING (CABG);  Surgeon: Melrose Nakayama, MD;  Location: [place];  Service: Open Heart Surgery;  Laterality: N/A;  Coronary artery bypass graft times five using left internal mammary artery and left greater saphenous vein via endovein harvest.  . Lower extremity angiogram Bilateral 11/16/2011    Procedure: LOWER EXTREMITY ANGIOGRAM;  Surgeon: Sherren Mocha, MD;  Location: [facility] CATH LAB;  Service: Cardiovascular;  Laterality: Bilateral;  . Abdominal aortagram N/A 11/16/2011    Procedure: ABDOMINAL Maxcine Ham;  Surgeon: Sherren Mocha, MD;  Location: [facility] CATH LAB;  Service: Cardiovascular;  Laterality: N/A;  . Percutaneous stent intervention Left 11/16/2011    Procedure: PERCUTANEOUS STENT INTERVENTION;  Surgeon: Sherren Mocha, MD;  Location: [facility] CATH LAB;  Service: Cardiovascular;  Laterality: Left;  . Abdominal aortagram N/A 11/14/2012    Procedure: ABDOMINAL Maxcine Ham;  Surgeon: Sherren Mocha, MD;  Location: [facility] CATH LAB;  Service: Cardiovascular;  Laterality: N/A;  . Abdominal aortagram N/A 12/30/2014    Procedure: ABDOMINAL AORTAGRAM;  Surgeon: Serafina Mitchell, MD;  Location: [facility] CATH LAB;  Service: Cardiovascular;  Laterality: N/A;   Family History  Problem Relation  Age of Onset  . Heart disease Mother     No clear CAD and Heart Disease before age 77  . Hypertension Mother   . Cancer Mother     ? colon ca  . Hyperlipidemia Mother   . Cancer Father     brain tumor  . Alzheimer's disease Father   . Colon cancer Neg Hx   . Heart attack Neg Hx    History  Substance Use Topics  . Smoking status: Former Smoker -- 2.00 packs/day for 47 years    Types: Cigarettes    Quit date: 12/17/2012  . Smokeless tobacco: Never Used  . Alcohol Use: 2.4 oz/week    4 Shots of liquor per week     Comment:  08/26/2013 "3-4 mixed drinks/wk"    Review of Systems  Constitutional: Negative for fever.  HENT: Negative.   Respiratory: Negative for shortness of breath.   Cardiovascular: Negative for chest pain.  Gastrointestinal: Positive for nausea.  Skin: Positive for rash.  Neurological: Negative for dizziness and light-headedness.    Allergies  Itraconazole and Benazepril  Home Medications   Prior to Admission medications   Medication Sig Start Date End Date Taking? Authorizing Provider  amLODipine (NORVASC) 10 MG tablet Take 0.5 tablets (5 mg total) by mouth daily. Patient taking differently: Take 5 mg by mouth at bedtime.  11/26/13   Aleksei Plotnikov V, MD  aspirin 325 MG EC tablet Take 325 mg by mouth daily.    Historical Provider, MD  atenolol (TENORMIN) 50 MG tablet Take 50 mg by mouth at bedtime.    Historical Provider, MD  atorvastatin (LIPITOR) 20 MG tablet TAKE 1 TABLET BY MOUTH EVERY DAY 12/25/14   Aleksei Plotnikov V, MD  Avanafil (STENDRA) 100 MG TABS Take 100 mg by mouth daily as needed. 01/14/15   Aleksei Plotnikov V, MD  cholecalciferol (VITAMIN D) 1000 UNITS tablet Take 1,000 Units by mouth 2 (two) times daily.     Historical Provider, MD  clonazePAM (KLONOPIN) 0.25 MG disintegrating tablet DISSOLVE 1 TABLET BY MOUTH TWICE DAILY AS NEEDED. 11/04/14   Lew Dawes V, MD  hydrALAZINE (APRESOLINE) 50 MG tablet TAKE 1 TABLET BY MOUTH THREE TIMES DAILY 12/29/14   Lew Dawes V, MD  naproxen sodium (ANAPROX) 220 MG tablet Take 440 mg by mouth daily as needed (leg pain).    Historical Provider, MD  omeprazole (PRILOSEC) 20 MG capsule Take 1 capsule (20 mg total) by mouth daily. 01/23/15   Melony Overly, MD  potassium chloride SA (K-DUR,KLOR-CON) 20 MEQ tablet Take 1 tablet (20 mEq total) by mouth 2 (two) times daily. 03/14/14   [name], NP  predniSONE (DELTASONE) 50 MG tablet Take 1 pill daily for 5 days. 01/23/15   Melony Overly, MD  vitamin B-12 (CYANOCOBALAMIN) 1000 MCG  tablet Take 1,000 mcg by mouth daily.    Historical Provider, MD   BP 130/63 mmHg  Pulse 63  Temp(Src) 97.2 F (36.2 C) (Oral)  Resp 22  SpO2 95% Physical Exam  Constitutional: He is oriented to person, place, and time. He appears well-developed and well-nourished. He appears distressed (looks uncomfortable).  Neck: Neck supple.  Cardiovascular: Normal rate.   Pulmonary/Chest: Effort normal.  Neurological: He is alert and oriented to person, place, and time.  Skin:  Diffuse erythematous rash over upper body.    ED Course  Procedures (including critical care time) Labs Review Labs Reviewed - No data to display  Imaging Review No results found.  MDM   1. Medication reaction, initial encounter    Solu-Medrol 125 mg IM given.  He is likely having an allergic reaction to itraconazole. I have instructed him to stop this medication and have added it to his allergy list. Recommended Benadryl every 6 hours to help with itching. We'll also treat with a five-day course of prednisone. Recommended taking omeprazole while taking the prednisone. He will call his podiatrist to let him know that he cannot take this medication. Warning signs reviewed as in after visit summary.    Melony Overly, MD 01/23/15 [phone]

## 2015-01-25 ENCOUNTER — Emergency Department (INDEPENDENT_AMBULATORY_CARE_PROVIDER_SITE_OTHER)
Admission: EM | Admit: 2015-01-25 | Discharge: 2015-01-25 | Disposition: A | Discharge status: Home / Self Care | Payer: BLUE CROSS/BLUE SHIELD | Source: Home / Self Care | Attending: Family Medicine | Admitting: Family Medicine

## 2015-01-25 ENCOUNTER — Encounter (HOSPITAL_COMMUNITY): Payer: Self-pay | Admitting: *Deleted

## 2015-01-25 DIAGNOSIS — T50905D Adverse effect of unspecified drugs, medicaments and biological substances, subsequent encounter: Secondary | ICD-10-CM

## 2015-01-25 DIAGNOSIS — R21 Rash and other nonspecific skin eruption: Secondary | ICD-10-CM

## 2015-01-25 DIAGNOSIS — T887XXD Unspecified adverse effect of drug or medicament, subsequent encounter: Secondary | ICD-10-CM | POA: Diagnosis not present

## 2015-01-25 NOTE — ED Provider Notes (Signed)
CSN: [id number]     Arrival date & time 01/25/15  1003 History   First MD Initiated Contact with Patient 01/25/15 1148     Chief Complaint  Patient presents with  . Allergic Reaction   (Consider location/radiation/quality/duration/timing/severity/associated sxs/prior Treatment) HPI Comments: Patient was seen in the urgent care 2 days ago for deep red rash to the face, neck and part of the torso. He was treated with an injection of a steroid followed by a tapering oral dose. He was also asked take Benadryl and Zantac twice a day. He states he has been compliant. He states the itching is little better however the rash now has spread to the proximal extremities and the truncal rash has deepened and now covers most all of that body surface area. Denies oral lesions or intraoral pain or tenderness, denies trouble breathing, wheezing or swelling. There is no scaling/desquamation.  Patient is a 63 y.o. male presenting with allergic reaction.  Allergic Reaction Presenting symptoms: rash   Presenting symptoms: no wheezing     Past Medical History  Diagnosis Date  . History of colonic polyps   . COPD (chronic obstructive pulmonary disease)   . CAD (coronary artery disease)     Mild plaque (cath "years ago"); abnormal Myoview 04/2013 with subsequent CABG x 5 with LIMA to LAD, SVG to OM1, SVG to DX, SVG to PD & PL.   Marland Kitchen GERD (gastroesophageal reflux disease)   . Hyperlipidemia   . Hypertension   . Vitamin D deficiency   . Meralgia paresthetica of left side 2011  . LBP (low back pain)   . Hx of echocardiogram     Echo (9/15):  EF 55-60%; Gr 2 DD, mild BAE  . Anxiety   . PVD (peripheral vascular disease)     Stent to left common femoral and right superficial femoral.  2008.  50%  left renal   . 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 Shortness of breath     "once in awhile; can happen at anytime" (08/26/2013)   Past Surgical History   Procedure Laterality Date  . Lower extremity stents      bilateral lower extremities x 2  . Femoral-popliteal bypass graft  12/27/2012    Procedure: BYPASS GRAFT FEMORAL-POPLITEAL ARTERY;  Surgeon: Serafina Mitchell, MD;  Location: [facility] OR;  Service: Vascular;  Laterality: Right;  using non-reversed sapphenous vein.  . Cardiac catheterization  05/02/13    x2   . Coronary artery bypass graft N/A 05/06/2013    Procedure: CORONARY ARTERY BYPASS GRAFTING (CABG);  Surgeon: Melrose Nakayama, MD;  Location: [place];  Service: Open Heart Surgery;  Laterality: N/A;  Coronary artery bypass graft times five using left internal mammary artery and left greater saphenous vein via endovein harvest.  . Lower extremity angiogram Bilateral 11/16/2011    Procedure: LOWER EXTREMITY ANGIOGRAM;  Surgeon: Sherren Mocha, MD;  Location: [facility] CATH LAB;  Service: Cardiovascular;  Laterality: Bilateral;  . Abdominal aortagram N/A 11/16/2011    Procedure: ABDOMINAL Maxcine Ham;  Surgeon: Sherren Mocha, MD;  Location: [facility] CATH LAB;  Service: Cardiovascular;  Laterality: N/A;  . Percutaneous stent intervention Left 11/16/2011    Procedure: PERCUTANEOUS STENT INTERVENTION;  Surgeon: Sherren Mocha, MD;  Location: [facility] CATH LAB;  Service: Cardiovascular;  Laterality: Left;  . Abdominal aortagram N/A 11/14/2012    Procedure: ABDOMINAL Maxcine Ham;  Surgeon: Sherren Mocha, MD;  Location: [facility] CATH LAB;  Service:  Cardiovascular;  Laterality: N/A;  . Abdominal aortagram N/A 12/30/2014    Procedure: ABDOMINAL AORTAGRAM;  Surgeon: Serafina Mitchell, MD;  Location: [facility] CATH LAB;  Service: Cardiovascular;  Laterality: N/A;   Family History  Problem Relation Age of Onset  . Heart disease Mother     No clear CAD and Heart Disease before age 74  . Hypertension Mother   . Cancer Mother     ? colon ca  . Hyperlipidemia Mother   . Cancer Father     brain tumor  . Alzheimer's disease Father   . Colon cancer Neg Hx   . Heart attack Neg Hx     History  Substance Use Topics  . Smoking status: Former Smoker -- 2.00 packs/day for 47 years    Types: Cigarettes    Quit date: 12/17/2012  . Smokeless tobacco: Never Used  . Alcohol Use: 2.4 oz/week    4 Shots of liquor per week     Comment: 08/26/2013 "3-4 mixed drinks/wk"    Review of Systems  Constitutional: Negative for fever and activity change.  HENT: Negative for congestion, ear pain, mouth sores and sore throat.   Eyes: Negative.   Respiratory: Negative for cough, choking, chest tightness, shortness of breath, wheezing and stridor.   Cardiovascular: Negative for chest pain, palpitations and leg swelling.  Gastrointestinal: Negative.   Genitourinary: Negative.   Musculoskeletal: Negative for myalgias and arthralgias.  Skin: Positive for color change and rash.  Neurological: Negative for dizziness, tremors, seizures, syncope, speech difficulty, weakness and headaches.    Allergies  Itraconazole and Benazepril  Home Medications   Prior to Admission medications   Medication Sig Start Date End Date Taking? Authorizing Provider  amLODipine (NORVASC) 10 MG tablet Take 0.5 tablets (5 mg total) by mouth daily. Patient taking differently: Take 5 mg by mouth at bedtime.  11/26/13  Yes Aleksei Plotnikov V, MD  aspirin 325 MG EC tablet Take 325 mg by mouth daily.   Yes Historical Provider, MD  atenolol (TENORMIN) 50 MG tablet Take 50 mg by mouth at bedtime.   Yes Historical Provider, MD  atorvastatin (LIPITOR) 20 MG tablet TAKE 1 TABLET BY MOUTH EVERY DAY 12/25/14  Yes Aleksei Plotnikov V, MD  cholecalciferol (VITAMIN D) 1000 UNITS tablet Take 1,000 Units by mouth 2 (two) times daily.    Yes Historical Provider, MD  clonazePAM (KLONOPIN) 0.25 MG disintegrating tablet DISSOLVE 1 TABLET BY MOUTH TWICE DAILY AS NEEDED. 11/04/14  Yes Aleksei Plotnikov V, MD  hydrALAZINE (APRESOLINE) 50 MG tablet TAKE 1 TABLET BY MOUTH THREE TIMES DAILY 12/29/14  Yes Aleksei Plotnikov V, MD  predniSONE  (DELTASONE) 50 MG tablet Take 1 pill daily for 5 days. 01/23/15  Yes Melony Overly, MD  ranitidine (ZANTAC) 150 MG tablet Take 150 mg by mouth 2 (two) times daily.   Yes Historical Provider, MD  vitamin B-12 (CYANOCOBALAMIN) 1000 MCG tablet Take 1,000 mcg by mouth daily.   Yes Historical Provider, MD  Avanafil (STENDRA) 100 MG TABS Take 100 mg by mouth daily as needed. 01/14/15   Aleksei Plotnikov V, MD  naproxen sodium (ANAPROX) 220 MG tablet Take 440 mg by mouth daily as needed (leg pain).    Historical Provider, MD  omeprazole (PRILOSEC) 20 MG capsule Take 1 capsule (20 mg total) by mouth daily. 01/23/15   Melony Overly, MD  potassium chloride SA (K-DUR,KLOR-CON) 20 MEQ tablet Take 1 tablet (20 mEq total) by mouth 2 (two) times daily. 03/14/14  [name], NP   BP 151/74 mmHg  Pulse 59  Temp(Src) 98 F (36.7 C) (Oral)  Resp 16  SpO2 96% Physical Exam  ED Course  Procedures (including critical care time) Labs Review Labs Reviewed - No data to display  Imaging Review No results found.   MDM   1. Morbilliform rash   2. Drug reaction, subsequent encounter    Rash is consistent with drug reaction. He is taking several drugs which makes it difficult to discern which one. It is noted that he did discontinue the antifungal agent 1 day prior to the onset of the rash. Hopefully with his current medication the rash will resolve within the next few days. He is in no distress and has no signs of angioedema. He will be discharged home to continue the same medication if he gets worse tickly with swelling, problems breathing, intolerable itching he should go to the emergency department. I have also advised him to call his PCP tomorrow to be seen as soon as possible since he has vascular surgery scheduled for Thursday.    Janne Napoleon, NP 01/25/15 1323

## 2015-01-25 NOTE — Discharge Instructions (Signed)
Drug Rash Skin reactions can be caused by several different drugs. Allergy to the medicine can cause itching, hives, and other rashes. Sun exposure causes a red rash with some medicines. Mononucleosis virus can cause a similar red rash when you are taking antibiotics. Sometimes, the rash may be accompanied by pain. The drug rash may happen with new drugs or with medicines that you have been taking for a while. The rash cannot be spread from person to person. In most cases, the symptoms of a drug rash are gone within a few days of stopping the medicine. Your rash, including hives (urticaria), is most likely from the following medicines:  Antibiotics or antimicrobials.  Anticonvulsants or seizure medicines.  Antihypertensives or blood pressure medicines.  Antimalarials.  Antidepressants or depression medicines.  Antianxiety drugs.  Diuretics or water pills.  Nonsteroidal anti-inflammatory drugs.  Simvastatin.  Lithium.  Omeprazole.  Allopurinol.  Pseudoephedrine.  Amiodarone.  Packed red blood cells, when you get a blood transfusion.  Contrast media, such as when getting an imaging test (CT or CAT scan). This drug list is not all inclusive, but drug rashes have been reported with all the medicines listed above. Your caregiver will tell you which medicines to avoid. If you react to a medicine, a similar or worse reaction can occur the next time you take it. If you need to stop taking an antibiotic because of a drug rash, an alternative antibiotic may be needed to get rid of your infection. Antihistamine or cortisone drugs may be prescribed to help relieve your symptoms. Stay out of the sun until the rash is completely gone.  Be sure to let your caregiver know about your drug reaction. Do not take this medicine in the future. Call your caregiver if your drug rash does not improve within 3 to 4 days. SEEK IMMEDIATE MEDICAL CARE IF:   You develop breathing problems, swelling in the  throat, or wheezing.  You have weakness, fainting, fever, and muscle or joint pains.  You develop blisters or peeling of skin, especially around the mouth. Document Released: 12/22/2004 Document Revised: 03/31/2014 Document Reviewed: 10/02/2008 ExitCare Patient Information 2015 [employer]. This information is not intended to replace advice given to you by your health care provider. Make sure you discuss any questions you have with your health care provider.  

## 2015-01-25 NOTE — ED Notes (Addendum)
C/o rash all over his body with itching onset 2/26 and has never got any better.  He is taking prednisone.  Last dose yesterday.  Skin is red like sunburn. He stopped Itraconazole 2/25 but rash has not gone away.  No SOB.

## 2015-01-26 ENCOUNTER — Telehealth: Payer: Self-pay | Admitting: *Deleted

## 2015-01-26 ENCOUNTER — Other Ambulatory Visit: Payer: Self-pay | Admitting: *Deleted

## 2015-01-26 NOTE — Telephone Encounter (Signed)
Stendra 100 mg PA is approved 01/26/15 until 11/27/2038. Pharmacy informed.

## 2015-01-26 NOTE — Telephone Encounter (Signed)
Pt states he is having surgery Thursday AM. He states Dr. Blenda Mounts gave him intraconazole and it caused a rash. He wanted PCP aware prior to his surgery. He is seeing Dr. Linna Darner 01/27/15.

## 2015-01-27 ENCOUNTER — Ambulatory Visit (INDEPENDENT_AMBULATORY_CARE_PROVIDER_SITE_OTHER): Payer: BLUE CROSS/BLUE SHIELD | Admitting: Internal Medicine

## 2015-01-27 ENCOUNTER — Encounter: Payer: Self-pay | Admitting: Internal Medicine

## 2015-01-27 VITALS — BP 130/65 | HR 66 | Temp 98.3°F | Ht 65.0 in | Wt 191.0 lb

## 2015-01-27 DIAGNOSIS — I10 Essential (primary) hypertension: Secondary | ICD-10-CM

## 2015-01-27 DIAGNOSIS — L509 Urticaria, unspecified: Secondary | ICD-10-CM

## 2015-01-27 MED ORDER — PREDNISONE 20 MG PO TABS: 20.0000 mg | ORAL_TABLET | Freq: Two times a day (BID) | ORAL | Status: DC

## 2015-01-27 MED ORDER — HYDROXYZINE HCL 10 MG PO TABS: 10.0000 mg | ORAL_TABLET | Freq: Three times a day (TID) | ORAL | Status: DC | PRN

## 2015-01-27 NOTE — Patient Instructions (Signed)
  Urticaria or hives represent an allergic reaction to foods or medications. At the top the list of causesare strawberries, tomatoes, chocolate, shellfish and nuts. You should avoid these as much is possible if having rash. Take medications as prescribed. Use hypoallergenic soaps and detergents.

## 2015-01-27 NOTE — Telephone Encounter (Signed)
Noted. Stop Ketoconazole. Keep OV w/Dr Beryle Beams

## 2015-01-27 NOTE — Progress Notes (Signed)
   Subjective:    Patient ID: Douglas Christensen, male    DOB: 1951-07-25, 64 y.o.   MRN: [id number]  HPI  His rash began 2/25 or 2/26 as flat red itchy lesions over his upper and lower extremities as well as the trunk anteriorly and posteriorly. He had started on an antifungal 2/22 and taken it 3 days before the rash appeared. He was scheduled to take it 7 days and then leave it off for 3 weeks and then repeat the cycle twice. He has not taken the medicine after the initial 3 days.  Benadryl did resolve the itching but the redness persisted. He was seen at urgent care and placed on prednisone. There was significant improvement until the lesions recurred today on his arms.  Review of Systems He has claudication & is to have peripheral vascular surgery in the near future.  He is not monitoring BP ; his cuff "malfunctioned".    Objective:   Physical Exam  Pertinent or positive findings include : Dental plaque is present. He has some facial plethora.  He has a diffuse faintly, flat erythematous rash over the upper extremities and trunk. Large ventral hernia is noted. Pedal pulses are decreased, especially posterior tibial pulses.  General appearance: BMI 31.78.Adequately nourished; no acute distress or increased work of breathing is present.  No  lymphadenopathy about the head, neck, or axilla noted.  Eyes: No conjunctival inflammation or lid edema is present. There is no scleral icterus. Ears:  External ear exam shows no significant lesions or deformities.  Otoscopic examination reveals clear canals, tympanic membranes are intact bilaterally without bulging, retraction, inflammation or discharge. Nose:  External nasal examination shows no deformity or inflammation. Nasal mucosa are pink and moist without lesions or exudates. No septal dislocation or deviation.No obstruction to airflow.  Oral exam:  lips and gums are healthy appearing.There is no oropharyngeal erythema or exudate noted.  Neck:  No  deformities, thyromegaly, masses, or tenderness noted.    Heart:  Normal rate and regular rhythm. S1 and S2 normal without gallop, murmur, click, rub or other extra sounds.  Lungs:Chest clear to auscultation; no wheezes, rhonchi,rales ,or rubs present. Extremities:  No cyanosis, edema, or clubbing  noted  Skin: Warm & dry w/o jaundice or tenting.       Assessment & Plan:  #1 drug-induced urticaria, most likely the antifungal. #2 HTN; adequate control  Plan: See orders recommendations

## 2015-01-27 NOTE — Telephone Encounter (Signed)
Pt informed

## 2015-01-27 NOTE — Progress Notes (Signed)
Pre visit review using our clinic review tool, if applicable. No additional management support is needed unless otherwise documented below in the visit note. 

## 2015-01-28 ENCOUNTER — Telehealth: Payer: Self-pay | Admitting: *Deleted

## 2015-01-28 ENCOUNTER — Ambulatory Visit: Payer: BLUE CROSS/BLUE SHIELD | Admitting: Internal Medicine

## 2015-01-28 MED ORDER — DEXTROSE 5 % IV SOLN
1.5000 g | INTRAVENOUS | Status: AC
  Administered 2015-01-29 (×2): 1.5 g via INTRAVENOUS
  Filled 2015-01-28: qty 1.5

## 2015-01-28 MED ORDER — SODIUM CHLORIDE 0.9 % IV SOLN: INTRAVENOUS | Status: DC

## 2015-01-28 NOTE — Telephone Encounter (Signed)
Patient was seen by Dr. Linna Darner yesterday for his rash ( from new antifungal medication). According to the patient, his rash is completely resolved and he has no itching today. He will have his surgery tomorrow with Dr. Trula Slade as planned.

## 2015-01-29 ENCOUNTER — Encounter (HOSPITAL_COMMUNITY): Payer: Self-pay | Admitting: *Deleted

## 2015-01-29 ENCOUNTER — Inpatient Hospital Stay (HOSPITAL_COMMUNITY)
Admission: RE | Admit: 2015-01-29 | Discharge: 2015-01-30 | DRG: 272 | Disposition: A | Discharge status: Home / Self Care | Payer: BLUE CROSS/BLUE SHIELD | Source: Ambulatory Visit | Attending: Surgery | Admitting: Surgery

## 2015-01-29 ENCOUNTER — Inpatient Hospital Stay (HOSPITAL_COMMUNITY): Payer: BLUE CROSS/BLUE SHIELD | Admitting: Vascular Surgery

## 2015-01-29 ENCOUNTER — Encounter (HOSPITAL_COMMUNITY)
Admission: RE | Disposition: A | Discharge status: Home / Self Care | Payer: Self-pay | Source: Ambulatory Visit | Attending: Surgery

## 2015-01-29 ENCOUNTER — Inpatient Hospital Stay (HOSPITAL_COMMUNITY): Payer: BLUE CROSS/BLUE SHIELD | Admitting: Anesthesiology

## 2015-01-29 DIAGNOSIS — E559 Vitamin D deficiency, unspecified: Secondary | ICD-10-CM | POA: Diagnosis present

## 2015-01-29 DIAGNOSIS — G4733 Obstructive sleep apnea (adult) (pediatric): Secondary | ICD-10-CM | POA: Diagnosis present

## 2015-01-29 DIAGNOSIS — E785 Hyperlipidemia, unspecified: Secondary | ICD-10-CM | POA: Diagnosis present

## 2015-01-29 DIAGNOSIS — I10 Essential (primary) hypertension: Secondary | ICD-10-CM | POA: Diagnosis present

## 2015-01-29 DIAGNOSIS — J449 Chronic obstructive pulmonary disease, unspecified: Secondary | ICD-10-CM | POA: Diagnosis present

## 2015-01-29 DIAGNOSIS — I2510 Atherosclerotic heart disease of native coronary artery without angina pectoris: Secondary | ICD-10-CM | POA: Diagnosis present

## 2015-01-29 DIAGNOSIS — Z79899 Other long term (current) drug therapy: Secondary | ICD-10-CM

## 2015-01-29 DIAGNOSIS — M545 Low back pain: Secondary | ICD-10-CM | POA: Diagnosis present

## 2015-01-29 DIAGNOSIS — Z95820 Peripheral vascular angioplasty status with implants and grafts: Secondary | ICD-10-CM

## 2015-01-29 DIAGNOSIS — Z7982 Long term (current) use of aspirin: Secondary | ICD-10-CM

## 2015-01-29 DIAGNOSIS — Z86010 Personal history of colonic polyps: Secondary | ICD-10-CM | POA: Diagnosis not present

## 2015-01-29 DIAGNOSIS — Z888 Allergy status to other drugs, medicaments and biological substances status: Secondary | ICD-10-CM

## 2015-01-29 DIAGNOSIS — Z8249 Family history of ischemic heart disease and other diseases of the circulatory system: Secondary | ICD-10-CM | POA: Diagnosis not present

## 2015-01-29 DIAGNOSIS — G5712 Meralgia paresthetica, left lower limb: Secondary | ICD-10-CM | POA: Diagnosis present

## 2015-01-29 DIAGNOSIS — I70212 Atherosclerosis of native arteries of extremities with intermittent claudication, left leg: Principal | ICD-10-CM | POA: Diagnosis present

## 2015-01-29 DIAGNOSIS — Z951 Presence of aortocoronary bypass graft: Secondary | ICD-10-CM

## 2015-01-29 DIAGNOSIS — I739 Peripheral vascular disease, unspecified: Secondary | ICD-10-CM

## 2015-01-29 DIAGNOSIS — Z87891 Personal history of nicotine dependence: Secondary | ICD-10-CM

## 2015-01-29 DIAGNOSIS — K219 Gastro-esophageal reflux disease without esophagitis: Secondary | ICD-10-CM | POA: Diagnosis present

## 2015-01-29 DIAGNOSIS — E780 Pure hypercholesterolemia: Secondary | ICD-10-CM | POA: Diagnosis present

## 2015-01-29 HISTORY — PX: ENDARTERECTOMY FEMORAL: SHX5804

## 2015-01-29 HISTORY — PX: ILIAC ATHERECTOMY: SHX6328

## 2015-01-29 LAB — CREATININE, SERUM
Creatinine, Ser: 0.91 mg/dL (ref 0.50–1.35)
GFR, EST NON AFRICAN AMERICAN: 88 mL/min — AB (ref >90)

## 2015-01-29 LAB — CBC
HCT: 40.5 % (ref 39.0–52.0)
Hemoglobin: 14 g/dL (ref 13.0–17.0)
MCH: 32.9 pg (ref 26.0–34.0)
MCHC: 34.6 g/dL (ref 30.0–36.0)
MCV: 95.1 fL (ref 78.0–100.0)
Platelets: 163 10*3/uL (ref 150–400)
RBC: 4.26 MIL/uL (ref 4.22–5.81)
RDW: 14.2 % (ref 11.5–15.5)
WBC: 11.3 10*3/uL — ABNORMAL HIGH (ref 4.0–10.5)

## 2015-01-29 SURGERY — ENDARTERECTOMY, FEMORAL
Anesthesia: General | Site: Groin | Laterality: Left

## 2015-01-29 MED ORDER — ONDANSETRON HCL 4 MG/2ML IJ SOLN
INTRAMUSCULAR | Status: DC | PRN
  Administered 2015-01-29: 4 mg via INTRAVENOUS

## 2015-01-29 MED ORDER — CHLORHEXIDINE GLUCONATE CLOTH 2 % EX PADS: 6.0000 | MEDICATED_PAD | Freq: Once | CUTANEOUS | Status: DC

## 2015-01-29 MED ORDER — LACTATED RINGERS IV SOLN
INTRAVENOUS | Status: DC | PRN
  Administered 2015-01-29 (×3): via INTRAVENOUS

## 2015-01-29 MED ORDER — HEMOSTATIC AGENTS (NO CHARGE) OPTIME
TOPICAL | Status: DC | PRN
  Administered 2015-01-29: 1 via TOPICAL

## 2015-01-29 MED ORDER — ALUM & MAG HYDROXIDE-SIMETH 200-200-20 MG/5ML PO SUSP: 15.0000 mL | ORAL | Status: DC | PRN

## 2015-01-29 MED ORDER — EPHEDRINE SULFATE 50 MG/ML IJ SOLN
INTRAMUSCULAR | Status: AC
  Filled 2015-01-29: qty 1

## 2015-01-29 MED ORDER — HYDROMORPHONE HCL 1 MG/ML IJ SOLN
0.2500 mg | INTRAMUSCULAR | Status: DC | PRN
  Administered 2015-01-29 (×2): 0.5 mg via INTRAVENOUS

## 2015-01-29 MED ORDER — ATORVASTATIN CALCIUM 20 MG PO TABS
20.0000 mg | ORAL_TABLET | Freq: Every day | ORAL | Status: DC
  Administered 2015-01-29 – 2015-01-30 (×2): 20 mg via ORAL
  Filled 2015-01-29 (×2): qty 1

## 2015-01-29 MED ORDER — LIDOCAINE HCL (CARDIAC) 20 MG/ML IV SOLN
INTRAVENOUS | Status: DC | PRN
  Administered 2015-01-29: 100 mg via INTRAVENOUS

## 2015-01-29 MED ORDER — ROCURONIUM BROMIDE 50 MG/5ML IV SOLN
INTRAVENOUS | Status: AC
  Filled 2015-01-29: qty 1

## 2015-01-29 MED ORDER — MIDAZOLAM HCL 5 MG/5ML IJ SOLN
INTRAMUSCULAR | Status: DC | PRN
  Administered 2015-01-29 (×2): 1 mg via INTRAVENOUS

## 2015-01-29 MED ORDER — ONDANSETRON HCL 4 MG/2ML IJ SOLN: 4.0000 mg | Freq: Once | INTRAMUSCULAR | Status: DC | PRN

## 2015-01-29 MED ORDER — HYDRALAZINE HCL 50 MG PO TABS
50.0000 mg | ORAL_TABLET | Freq: Three times a day (TID) | ORAL | Status: DC
  Administered 2015-01-29 – 2015-01-30 (×2): 50 mg via ORAL
  Filled 2015-01-29 (×5): qty 1

## 2015-01-29 MED ORDER — FENTANYL CITRATE 0.05 MG/ML IJ SOLN
INTRAMUSCULAR | Status: AC
  Filled 2015-01-29: qty 5

## 2015-01-29 MED ORDER — PREDNISONE 20 MG PO TABS
20.0000 mg | ORAL_TABLET | Freq: Two times a day (BID) | ORAL | Status: DC
  Administered 2015-01-29 – 2015-01-30 (×2): 20 mg via ORAL
  Filled 2015-01-29 (×3): qty 1

## 2015-01-29 MED ORDER — MIDAZOLAM HCL 10 MG/2ML IJ SOLN
INTRAMUSCULAR | Status: AC
  Filled 2015-01-29: qty 2

## 2015-01-29 MED ORDER — METOPROLOL TARTRATE 1 MG/ML IV SOLN: 2.0000 mg | INTRAVENOUS | Status: DC | PRN

## 2015-01-29 MED ORDER — SODIUM CHLORIDE 0.9 % IV SOLN
INTRAVENOUS | Status: DC | PRN
  Administered 2015-01-29: 100 mL via SURGICAL_CAVITY

## 2015-01-29 MED ORDER — CEFUROXIME SODIUM 1.5 G IJ SOLR
1.5000 g | Freq: Once | INTRAMUSCULAR | Status: DC
  Filled 2015-01-29: qty 1.5

## 2015-01-29 MED ORDER — VITAMIN B-12 1000 MCG PO TABS
1000.0000 ug | ORAL_TABLET | Freq: Every day | ORAL | Status: DC
  Administered 2015-01-29 – 2015-01-30 (×2): 1000 ug via ORAL
  Filled 2015-01-29 (×2): qty 1

## 2015-01-29 MED ORDER — ATENOLOL 50 MG PO TABS
50.0000 mg | ORAL_TABLET | Freq: Every day | ORAL | Status: DC
  Administered 2015-01-29: 50 mg via ORAL
  Filled 2015-01-29 (×2): qty 1

## 2015-01-29 MED ORDER — DOCUSATE SODIUM 100 MG PO CAPS
100.0000 mg | ORAL_CAPSULE | Freq: Every day | ORAL | Status: DC
  Administered 2015-01-30: 100 mg via ORAL
  Filled 2015-01-29: qty 1

## 2015-01-29 MED ORDER — 0.9 % SODIUM CHLORIDE (POUR BTL) OPTIME
TOPICAL | Status: DC | PRN
  Administered 2015-01-29: 1000 mL

## 2015-01-29 MED ORDER — MEPERIDINE HCL 25 MG/ML IJ SOLN: 6.2500 mg | INTRAMUSCULAR | Status: DC | PRN

## 2015-01-29 MED ORDER — ONDANSETRON HCL 4 MG/2ML IJ SOLN
INTRAMUSCULAR | Status: AC
  Filled 2015-01-29: qty 2

## 2015-01-29 MED ORDER — POTASSIUM CHLORIDE CRYS ER 20 MEQ PO TBCR
20.0000 meq | EXTENDED_RELEASE_TABLET | Freq: Two times a day (BID) | ORAL | Status: DC
  Administered 2015-01-29 – 2015-01-30 (×2): 20 meq via ORAL
  Filled 2015-01-29 (×3): qty 1

## 2015-01-29 MED ORDER — HEPARIN SODIUM (PORCINE) 1000 UNIT/ML IJ SOLN
INTRAMUSCULAR | Status: AC
  Filled 2015-01-29: qty 1

## 2015-01-29 MED ORDER — SODIUM CHLORIDE 0.9 % IV SOLN
INTRAVENOUS | Status: DC
  Administered 2015-01-29: 17:00:00 via INTRAVENOUS

## 2015-01-29 MED ORDER — PROPOFOL 10 MG/ML IV BOLUS
INTRAVENOUS | Status: DC | PRN
  Administered 2015-01-29: 20 mg via INTRAVENOUS
  Administered 2015-01-29: 150 mg via INTRAVENOUS

## 2015-01-29 MED ORDER — HYDROMORPHONE HCL 1 MG/ML IJ SOLN
INTRAMUSCULAR | Status: AC
  Filled 2015-01-29: qty 1

## 2015-01-29 MED ORDER — ASPIRIN EC 325 MG PO TBEC: 325.0000 mg | DELAYED_RELEASE_TABLET | Freq: Every day | ORAL | Status: DC

## 2015-01-29 MED ORDER — DEXTROSE 5 % IV SOLN
INTRAVENOUS | Status: DC | PRN
  Administered 2015-01-29: 08:00:00 via INTRAVENOUS

## 2015-01-29 MED ORDER — ONDANSETRON HCL 4 MG/2ML IJ SOLN
4.0000 mg | Freq: Four times a day (QID) | INTRAMUSCULAR | Status: DC | PRN
  Filled 2015-01-29: qty 2

## 2015-01-29 MED ORDER — PHENOL 1.4 % MT LIQD
1.0000 | OROMUCOSAL | Status: DC | PRN
  Administered 2015-01-29: 1 via OROMUCOSAL
  Filled 2015-01-29: qty 177

## 2015-01-29 MED ORDER — ROCURONIUM BROMIDE 100 MG/10ML IV SOLN
INTRAVENOUS | Status: DC | PRN
  Administered 2015-01-29: 10 mg via INTRAVENOUS

## 2015-01-29 MED ORDER — HEPARIN SODIUM (PORCINE) 1000 UNIT/ML IJ SOLN
INTRAMUSCULAR | Status: DC | PRN
  Administered 2015-01-29: 6000 [IU] via INTRAVENOUS
  Administered 2015-01-29: 2000 [IU] via INTRAVENOUS
  Administered 2015-01-29: 1000 [IU] via INTRAVENOUS
  Administered 2015-01-29: 2000 [IU] via INTRAVENOUS
  Administered 2015-01-29: 1000 [IU] via INTRAVENOUS

## 2015-01-29 MED ORDER — AMLODIPINE BESYLATE 5 MG PO TABS
5.0000 mg | ORAL_TABLET | Freq: Every day | ORAL | Status: DC
  Administered 2015-01-29: 5 mg via ORAL
  Filled 2015-01-29 (×2): qty 1

## 2015-01-29 MED ORDER — MORPHINE SULFATE 2 MG/ML IJ SOLN
2.0000 mg | INTRAMUSCULAR | Status: DC | PRN
  Administered 2015-01-29 (×3): 4 mg via INTRAVENOUS
  Filled 2015-01-29 (×3): qty 2

## 2015-01-29 MED ORDER — LABETALOL HCL 5 MG/ML IV SOLN: 10.0000 mg | INTRAVENOUS | Status: DC | PRN

## 2015-01-29 MED ORDER — NITROGLYCERIN 1 MG/10 ML FOR IR/CATH LAB
INTRA_ARTERIAL | Status: DC | PRN
  Administered 2015-01-29: 100 ug via INTRA_ARTERIAL

## 2015-01-29 MED ORDER — POTASSIUM CHLORIDE CRYS ER 20 MEQ PO TBCR: 20.0000 meq | EXTENDED_RELEASE_TABLET | Freq: Every day | ORAL | Status: DC | PRN

## 2015-01-29 MED ORDER — ASPIRIN EC 325 MG PO TBEC
325.0000 mg | DELAYED_RELEASE_TABLET | Freq: Every day | ORAL | Status: DC
  Administered 2015-01-29: 325 mg via ORAL
  Filled 2015-01-29: qty 1

## 2015-01-29 MED ORDER — VITAMIN D3 25 MCG (1000 UNIT) PO TABS
1000.0000 [IU] | ORAL_TABLET | Freq: Two times a day (BID) | ORAL | Status: DC
  Administered 2015-01-29 – 2015-01-30 (×2): 1000 [IU] via ORAL
  Filled 2015-01-29 (×3): qty 1

## 2015-01-29 MED ORDER — ACETAMINOPHEN 325 MG PO TABS: 325.0000 mg | ORAL_TABLET | ORAL | Status: DC | PRN

## 2015-01-29 MED ORDER — DEXTROSE 5 % IV SOLN
INTRAVENOUS | Status: DC | PRN
  Administered 2015-01-29: 12:00:00 via INTRAVENOUS

## 2015-01-29 MED ORDER — SUCCINYLCHOLINE CHLORIDE 20 MG/ML IJ SOLN
INTRAMUSCULAR | Status: DC | PRN
  Administered 2015-01-29: 120 mg via INTRAVENOUS

## 2015-01-29 MED ORDER — SODIUM CHLORIDE 0.9 % IR SOLN
Status: DC | PRN
  Administered 2015-01-29 (×2): 500 mL

## 2015-01-29 MED ORDER — ENOXAPARIN SODIUM 30 MG/0.3ML ~~LOC~~ SOLN
30.0000 mg | SUBCUTANEOUS | Status: DC
Start: 2015-01-30 — End: 2015-01-30
  Filled 2015-01-29: qty 0.3

## 2015-01-29 MED ORDER — IODIXANOL 320 MG/ML IV SOLN
INTRAVENOUS | Status: DC | PRN
  Administered 2015-01-29: 50 mL via INTRA_ARTERIAL
  Administered 2015-01-29: 10 mL via INTRA_ARTERIAL

## 2015-01-29 MED ORDER — SODIUM CHLORIDE 0.9 % IV SOLN: 500.0000 mL | Freq: Once | INTRAVENOUS | Status: AC | PRN

## 2015-01-29 MED ORDER — VERAPAMIL HCL 2.5 MG/ML IV SOLN
INTRAVENOUS | Status: DC | PRN
  Administered 2015-01-29: 2.5 mg via INTRAVENOUS

## 2015-01-29 MED ORDER — ARTIFICIAL TEARS OP OINT
TOPICAL_OINTMENT | OPHTHALMIC | Status: AC
  Filled 2015-01-29: qty 3.5

## 2015-01-29 MED ORDER — FAMOTIDINE 20 MG PO TABS
20.0000 mg | ORAL_TABLET | Freq: Every day | ORAL | Status: DC
  Administered 2015-01-30: 20 mg via ORAL
  Filled 2015-01-29 (×2): qty 1

## 2015-01-29 MED ORDER — NITROGLYCERIN 1 MG/10 ML FOR IR/CATH LAB
INTRA_ARTERIAL | Status: AC
  Filled 2015-01-29: qty 10

## 2015-01-29 MED ORDER — OXYCODONE HCL 5 MG PO TABS
5.0000 mg | ORAL_TABLET | ORAL | Status: DC | PRN
  Administered 2015-01-30: 5 mg via ORAL
  Filled 2015-01-29: qty 1

## 2015-01-29 MED ORDER — LIDOCAINE HCL (CARDIAC) 20 MG/ML IV SOLN
INTRAVENOUS | Status: AC
  Filled 2015-01-29: qty 5

## 2015-01-29 MED ORDER — EPHEDRINE SULFATE 50 MG/ML IJ SOLN
INTRAMUSCULAR | Status: DC | PRN
  Administered 2015-01-29: 10 mg via INTRAVENOUS

## 2015-01-29 MED ORDER — CLONAZEPAM 0.5 MG PO TABS: 0.2500 mg | ORAL_TABLET | Freq: Two times a day (BID) | ORAL | Status: DC | PRN

## 2015-01-29 MED ORDER — PROPOFOL 10 MG/ML IV BOLUS
INTRAVENOUS | Status: AC
  Filled 2015-01-29: qty 20

## 2015-01-29 MED ORDER — MIDAZOLAM HCL 2 MG/2ML IJ SOLN
INTRAMUSCULAR | Status: AC
  Filled 2015-01-29: qty 2

## 2015-01-29 MED ORDER — GUAIFENESIN-DM 100-10 MG/5ML PO SYRP: 15.0000 mL | ORAL_SOLUTION | ORAL | Status: DC | PRN

## 2015-01-29 MED ORDER — CLONAZEPAM 0.25 MG PO TBDP: 0.2500 mg | ORAL_TABLET | Freq: Two times a day (BID) | ORAL | Status: DC | PRN

## 2015-01-29 MED ORDER — ACETAMINOPHEN 650 MG RE SUPP: 325.0000 mg | RECTAL | Status: DC | PRN

## 2015-01-29 MED ORDER — CLOPIDOGREL BISULFATE 75 MG PO TABS
75.0000 mg | ORAL_TABLET | Freq: Every day | ORAL | Status: DC
  Administered 2015-01-30: 75 mg via ORAL
  Filled 2015-01-29: qty 1

## 2015-01-29 MED ORDER — FENTANYL CITRATE 0.05 MG/ML IJ SOLN
INTRAMUSCULAR | Status: DC | PRN
  Administered 2015-01-29: 200 ug via INTRAVENOUS
  Administered 2015-01-29 (×2): 50 ug via INTRAVENOUS
  Administered 2015-01-29: 25 ug via INTRAVENOUS

## 2015-01-29 MED ORDER — HYDROXYZINE HCL 10 MG PO TABS
10.0000 mg | ORAL_TABLET | Freq: Three times a day (TID) | ORAL | Status: DC | PRN
  Filled 2015-01-29: qty 1

## 2015-01-29 MED ORDER — PHENYLEPHRINE HCL 10 MG/ML IJ SOLN
10.0000 mg | INTRAVENOUS | Status: DC | PRN
  Administered 2015-01-29: 20 ug/min via INTRAVENOUS

## 2015-01-29 MED ORDER — PROTAMINE SULFATE 10 MG/ML IV SOLN
INTRAVENOUS | Status: DC | PRN
  Administered 2015-01-29 (×5): 10 mg via INTRAVENOUS

## 2015-01-29 MED ORDER — HYDRALAZINE HCL 20 MG/ML IJ SOLN: 5.0000 mg | INTRAMUSCULAR | Status: DC | PRN

## 2015-01-29 MED ORDER — DEXTROSE 5 % IV SOLN
1.5000 g | Freq: Two times a day (BID) | INTRAVENOUS | Status: AC
  Administered 2015-01-29 – 2015-01-30 (×2): 1.5 g via INTRAVENOUS
  Filled 2015-01-29 (×3): qty 1.5

## 2015-01-29 MED ORDER — SODIUM CHLORIDE 0.9 % IJ SOLN
INTRAMUSCULAR | Status: AC
  Filled 2015-01-29: qty 10

## 2015-01-29 SURGICAL SUPPLY — 84 items
BAG SNAP BAND KOVER 36X36 (MISCELLANEOUS) ×2 IMPLANT
BALLOON FOXCROSS 5X120X135 (BALLOONS) ×2 IMPLANT
BANDAGE ELASTIC 4 VELCRO ST LF (GAUZE/BANDAGES/DRESSINGS) IMPLANT
CANISTER SUCTION 2500CC (MISCELLANEOUS) ×2 IMPLANT
CATH ANGIO 5F BER2 100CM (CATHETERS) ×2 IMPLANT
CATH EMB 5FR 80CM (CATHETERS) ×2 IMPLANT
CLIP TI MEDIUM 24 (CLIP) ×2 IMPLANT
CLIP TI WIDE RED SMALL 24 (CLIP) ×2 IMPLANT
COVER BACK TABLE 60X90IN (DRAPES) ×2 IMPLANT
COVER DOME SNAP 22 D (MISCELLANEOUS) ×2 IMPLANT
COVER PROBE W GEL 5X96 (DRAPES) ×2 IMPLANT
DEVICE TORQUE H2O (MISCELLANEOUS) ×2 IMPLANT
DIAMONDBACK CLASSIC OAS 2.0MM (CATHETERS) ×2
DRAIN CHANNEL 15F RND FF W/TCR (WOUND CARE) IMPLANT
DRAPE X-RAY CASS 24X20 (DRAPES) IMPLANT
DRSG COVADERM 4X10 (GAUZE/BANDAGES/DRESSINGS) IMPLANT
DRSG COVADERM 4X8 (GAUZE/BANDAGES/DRESSINGS) IMPLANT
DRSG TEGADERM 4X4.75 (GAUZE/BANDAGES/DRESSINGS) ×10 IMPLANT
ELECT REM PT RETURN 9FT ADLT (ELECTROSURGICAL) ×2
ELECTRODE REM PT RTRN 9FT ADLT (ELECTROSURGICAL) ×1 IMPLANT
EVACUATOR SILICONE 100CC (DRAIN) IMPLANT
GAUZE SPONGE 4X4 16PLY XRAY LF (GAUZE/BANDAGES/DRESSINGS) ×2 IMPLANT
GLOVE BIO SURGEON STRL SZ 6.5 (GLOVE) ×12 IMPLANT
GLOVE BIO SURGEON STRL SZ7.5 (GLOVE) ×2 IMPLANT
GLOVE BIOGEL PI IND STRL 7.0 (GLOVE) ×3 IMPLANT
GLOVE BIOGEL PI IND STRL 7.5 (GLOVE) ×3 IMPLANT
GLOVE BIOGEL PI IND STRL 8 (GLOVE) ×1 IMPLANT
GLOVE BIOGEL PI INDICATOR 7.0 (GLOVE) ×3
GLOVE BIOGEL PI INDICATOR 7.5 (GLOVE) ×3
GLOVE BIOGEL PI INDICATOR 8 (GLOVE) ×1
GLOVE ECLIPSE 6.5 STRL STRAW (GLOVE) ×6 IMPLANT
GLOVE ECLIPSE 7.0 STRL STRAW (GLOVE) ×4 IMPLANT
GLOVE SURG SS PI 7.5 STRL IVOR (GLOVE) ×4 IMPLANT
GOWN STRL REUS W/ TWL LRG LVL3 (GOWN DISPOSABLE) ×2 IMPLANT
GOWN STRL REUS W/ TWL XL LVL3 (GOWN DISPOSABLE) ×1 IMPLANT
GOWN STRL REUS W/TWL LRG LVL3 (GOWN DISPOSABLE) ×2
GOWN STRL REUS W/TWL XL LVL3 (GOWN DISPOSABLE) ×1
GUIDEWIRE ANGLED .035X150CM (WIRE) ×2 IMPLANT
HEMOSTAT SNOW SURGICEL 2X4 (HEMOSTASIS) ×2 IMPLANT
KIT BASIN OR (CUSTOM PROCEDURE TRAY) ×2 IMPLANT
KIT ENCORE 26 ADVANTAGE (KITS) ×2 IMPLANT
KIT ROOM TURNOVER OR (KITS) ×2 IMPLANT
LIQUID BAND (GAUZE/BANDAGES/DRESSINGS) ×2 IMPLANT
NEEDLE 18GX1X1/2 (RX/OR ONLY) (NEEDLE) ×4 IMPLANT
NEEDLE PERC 18GX7CM (NEEDLE) ×2 IMPLANT
NS IRRIG 1000ML POUR BTL (IV SOLUTION) ×4 IMPLANT
PACK PERIPHERAL VASCULAR (CUSTOM PROCEDURE TRAY) ×2 IMPLANT
PAD ARMBOARD 7.5X6 YLW CONV (MISCELLANEOUS) ×4 IMPLANT
PAD ELECT DEFIB RADIOL ZOLL (MISCELLANEOUS) ×2 IMPLANT
PROBE PENCIL 8 MHZ STRL DISP (MISCELLANEOUS) IMPLANT
PROTECTION STATION PRESSURIZED (MISCELLANEOUS) ×2
SET COLLECT BLD 21X3/4 12 (NEEDLE) IMPLANT
SHEATH FLEXOR ANSEL 1 7F 45CM (SHEATH) ×2 IMPLANT
SPONGE INTESTINAL PEANUT (DISPOSABLE) ×2 IMPLANT
SPONGE LAP 18X18 X RAY DECT (DISPOSABLE) ×2 IMPLANT
STATION PROTECTION PRESSURIZED (MISCELLANEOUS) ×1 IMPLANT
STENT SMART CONTROL 7X60X120CM (Permanent Stent) ×2 IMPLANT
STENT SMART FLEX 7X100X120 (Permanent Stent) ×2 IMPLANT
STENT SMART FLEX 7X120X120 (Permanent Stent) ×2 IMPLANT
STOPCOCK 4 WAY LG BORE MALE ST (IV SETS) IMPLANT
SUT ETHILON 3 0 PS 1 (SUTURE) IMPLANT
SUT PROLENE 5 0 C 1 24 (SUTURE) ×8 IMPLANT
SUT PROLENE 5 0 C 1 36 (SUTURE) ×2 IMPLANT
SUT PROLENE 6 0 BV (SUTURE) ×2 IMPLANT
SUT SILK 3 0 (SUTURE) ×1
SUT SILK 3-0 18XBRD TIE 12 (SUTURE) ×1 IMPLANT
SUT VIC AB 2-0 CT1 27 (SUTURE) ×1
SUT VIC AB 2-0 CT1 TAPERPNT 27 (SUTURE) ×1 IMPLANT
SUT VIC AB 3-0 SH 27 (SUTURE) ×1
SUT VIC AB 3-0 SH 27X BRD (SUTURE) ×1 IMPLANT
SUT VICRYL 4-0 PS2 18IN ABS (SUTURE) ×4 IMPLANT
SYR 3ML LL SCALE MARK (SYRINGE) ×4 IMPLANT
SYRINGE 10CC LL (SYRINGE) ×4 IMPLANT
SYRINGE 20CC LL (MISCELLANEOUS) ×2 IMPLANT
SYSTEM DIMODBCK CLSC OAS 2.0MM (CATHETERS) ×1 IMPLANT
TAPE STRIPS DRAPE STRL (GAUZE/BANDAGES/DRESSINGS) ×2 IMPLANT
TAPE VIPERTRAK RADIOPAQUE 30CM (MISCELLANEOUS) ×2 IMPLANT
TOWEL OR 17X24 6PK STRL BLUE (TOWEL DISPOSABLE) ×2 IMPLANT
TRAY FOLEY CATH 16FRSI W/METER (SET/KITS/TRAYS/PACK) ×2 IMPLANT
TUBING EXTENTION W/L.L. (IV SETS) IMPLANT
UNDERPAD 30X30 INCONTINENT (UNDERPADS AND DIAPERS) ×2 IMPLANT
WATER STERILE IRR 1000ML POUR (IV SOLUTION) ×2 IMPLANT
WIRE BENTSON .035X145CM (WIRE) ×2 IMPLANT
WIRE VIPER ADVANCE .017X335CM (WIRE) ×2 IMPLANT

## 2015-01-29 NOTE — H&P (View-Only) (Signed)
Patient name: Douglas Christensen MRN: [id number] DOB: [birth year] Sex: male     Chief Complaint  Patient presents with  . Re-evaluation    1-2 wk f/u for discussion reguarding BP    HISTORY OF PRESENT ILLNESS: The patient is back today for worsening left leg claudication.  He has a history of right femoral to above-knee popliteal artery bypass graft with vein which was performed on 12/10/2012.  He recently underwent angiography.  He is back today to discuss results and surgical planning.  His walking and lifestyle are severely limited by his claudication in the left leg.  He denies ulcers.  The patient has a history of COPD secondary to chronic tobacco abuse.  He no longer smokes.  He suffers from hypercholesterolemia which is managed with a statin.  He is medically managed for hypertension.  He has a history of coronary artery disease.  He is status post CABG in 2014.    Past Medical History  Diagnosis Date  . History of colonic polyps   . COPD (chronic obstructive pulmonary disease)   . CAD (coronary artery disease)     Mild plaque (cath "years ago"); abnormal Myoview 04/2013 with subsequent CABG x 5 with LIMA to LAD, SVG to OM1, SVG to DX, SVG to PD & PL.   Marland Kitchen GERD (gastroesophageal reflux disease)   . Hyperlipidemia   . Hypertension   . Vitamin D deficiency   . Meralgia paresthetica of left side 2011  . LBP (low back pain)   . PVD (peripheral vascular disease)     Stent to left common femoral and right superficial femoral.  2008.  50%  left renal   . Sleep apnea     mod OSA, central sleep apnea/hypoapnea syndrome 11/22/12  . Shortness of breath     "once in awhile; can happen at anytime" (08/26/2013)  . Hx of echocardiogram     Echo (9/15):  EF 55-60%; Gr 2 DD, mild BAE    Past Surgical History  Procedure Laterality Date  . Lower extremity stents      bilateral lower extremities x 2  . Femoral-popliteal bypass graft  12/27/2012    Procedure: BYPASS GRAFT FEMORAL-POPLITEAL  ARTERY;  Surgeon: Serafina Mitchell, MD;  Location: [facility] OR;  Service: Vascular;  Laterality: Right;  using non-reversed sapphenous vein.  . Cardiac catheterization  05/02/13    x2   . Coronary artery bypass graft N/A 05/06/2013    Procedure: CORONARY ARTERY BYPASS GRAFTING (CABG);  Surgeon: Melrose Nakayama, MD;  Location: [place];  Service: Open Heart Surgery;  Laterality: N/A;  Coronary artery bypass graft times five using left internal mammary artery and left greater saphenous vein via endovein harvest.  . Lower extremity angiogram Bilateral 11/16/2011    Procedure: LOWER EXTREMITY ANGIOGRAM;  Surgeon: Sherren Mocha, MD;  Location: [facility] CATH LAB;  Service: Cardiovascular;  Laterality: Bilateral;  . Abdominal aortagram N/A 11/16/2011    Procedure: ABDOMINAL Maxcine Ham;  Surgeon: Sherren Mocha, MD;  Location: [facility] CATH LAB;  Service: Cardiovascular;  Laterality: N/A;  . Percutaneous stent intervention Left 11/16/2011    Procedure: PERCUTANEOUS STENT INTERVENTION;  Surgeon: Sherren Mocha, MD;  Location: [facility] CATH LAB;  Service: Cardiovascular;  Laterality: Left;  . Abdominal aortagram N/A 11/14/2012    Procedure: ABDOMINAL Maxcine Ham;  Surgeon: Sherren Mocha, MD;  Location: [facility] CATH LAB;  Service: Cardiovascular;  Laterality: N/A;  . Abdominal aortagram N/A 12/30/2014    Procedure: ABDOMINAL Maxcine Ham;  Surgeon: Butch Penny  Trula Slade, MD;  Location: [place] CATH LAB;  Service: Cardiovascular;  Laterality: N/A;    History   Social History  . Marital Status: Widowed    Spouse Name: N/A    Number of Children: 2  . Years of Education: N/A   Occupational History  . [employer]   Social History Main Topics  . Smoking status: Former Smoker -- 2.00 packs/day for 47 years    Types: Cigarettes    Quit date: 12/17/2012  . Smokeless tobacco: Never Used  . Alcohol Use: 2.4 oz/week    4 Shots of liquor per week     Comment: 08/26/2013 "3-4 mixed drinks/wk"  . Drug Use: No  . Sexual Activity: Yes    Other Topics Concern  . Not on file   Social History Narrative   Widower    Family History  Problem Relation Age of Onset  . Heart disease Mother     No clear CAD and Heart Disease before age 53  . Hypertension Mother   . Cancer Mother     ? colon ca  . Hyperlipidemia Mother   . Cancer Father     brain tumor  . Alzheimer's disease Father   . Colon cancer Neg Hx   . Heart attack Neg Hx     Allergies as of 01/05/2015 - Review Complete 01/05/2015  Allergen Reaction Noted  . Benazepril Cough 08/31/2012    Current Outpatient Prescriptions on File Prior to Visit  Medication Sig Dispense Refill  . amLODipine (NORVASC) 10 MG tablet Take 0.5 tablets (5 mg total) by mouth daily. 90 tablet 3  . aspirin 325 MG EC tablet Take 325 mg by mouth daily.    Marland Kitchen atenolol (TENORMIN) 50 MG tablet Take 50 mg by mouth at bedtime.    Marland Kitchen atorvastatin (LIPITOR) 20 MG tablet TAKE 1 TABLET BY MOUTH EVERY DAY 90 tablet 3  . cholecalciferol (VITAMIN D) 1000 UNITS tablet Take 1,000 Units by mouth 2 (two) times daily.     . clonazePAM (KLONOPIN) 0.25 MG disintegrating tablet DISSOLVE 1 TABLET BY MOUTH TWICE DAILY AS NEEDED. 60 tablet 3  . hydrALAZINE (APRESOLINE) 50 MG tablet TAKE 1 TABLET BY MOUTH THREE TIMES DAILY 270 tablet 0  . potassium chloride SA (K-DUR,KLOR-CON) 20 MEQ tablet Take 1 tablet (20 mEq total) by mouth 2 (two) times daily. 90 tablet 3  . vitamin B-12 (CYANOCOBALAMIN) 1000 MCG tablet Take 1,000 mcg by mouth daily.    Marland Kitchen itraconazole (SPORANOX) 100 MG capsule Take 2 100 mg capsules twice daily for 7 days, then take 3 weeks off. Repeat this cycle 2 more times (Patient not taking: Reported on 01/05/2015) 28 capsule 2   No current facility-administered medications on file prior to visit.     REVIEW OF SYSTEMS: Cardiovascular: No chest pain, chest pressure, palpitations, orthopnea, or dyspnea on exertion. severe claudication   No history of DVT or phlebitis. Pulmonary: No productive cough,  asthma or wheezing. Neurologic: No weakness, paresthesias, aphasia, or amaurosis. No dizziness. Hematologic: No bleeding problems or clotting disorders. Musculoskeletal: No joint pain or joint swelling. Gastrointestinal: No blood in stool or hematemesis Genitourinary: No dysuria or hematuria. Psychiatric:: No history of major depression. Integumentary: No rashes or ulcers. Constitutional: No fever or chills.  PHYSICAL EXAMINATION:   Vital signs are BP 151/80 mmHg  Pulse 61  Ht 5\' 5"  (1.651 m)  Wt 187 lb (84.823 kg)  BMI 31.12 kg/m2  SpO2 96% General: The patient appears their stated age. HEENT:  No gross abnormalities Pulmonary:  Non labored breathing Abdomen: Soft and non-tender Musculoskeletal: There are no major deformities. Neurologic: No focal weakness or paresthesias are detected, Skin: There are no ulcer or rashes noted. Psychiatric: The patient has normal affect. Cardiovascular: There is a regular rate and rhythm without significant murmur appreciated.   Diagnostic Studies Vein mapping showed no adequate saphenous vein.  Assessment: Left leg claudication Plan: I discussed multiple treatment options with the patient.  #1 would be femoral endarterectomy with patch angioplasty.  #2 would be femoral endarterectomy with above-knee popliteal artery bypass  with PTFE.  #3 would be left femoral endarterectomy with patch angioplasty and percutaneous treatment of his stenosis within the superficial femoral artery using atherectomy and drug coated balloon.  We have decided with the latter.  This is been scheduled for March 3. I have contacted the C[employer] rep  V. Leia Alf, M.D. [facility] Office: [phone] Pager:  [phone]

## 2015-01-29 NOTE — Op Note (Signed)
Patient name: Douglas Christensen MRN: [id number] DOB: [birth year] Sex: male  01/29/2015 Pre-operative Diagnosis: Left leg claudication Post-operative diagnosis:  Same Surgeon:  Eldridge Abrahams Assistants:  Gae Gallop, Leontine Locket Procedure:   #1: Left external iliac, common femoral, profunda femoral, and superficial femoral endarterectomy with vein patch angioplasty   #2: Atherectomy, left superficial femoral artery   #3: Stent, left superficial femoral artery Anesthesia:  Gen. Blood Loss:  See anesthesia record Specimens:  None  Findings:  Extensive plaque within the common femoral, external iliac, and superficial femoral arteries as well as the origin of the profunda.  There was also bulky plaque in the distal profunda femoral artery but no stenosis was visualized on angiography, and therefore I did not resect this.  The endarterectomy went out onto the superficial femoral artery for approximately 3 cm.  I used the stump of his saphenous vein for vein patch.  Atherectomy was performed with the CSI 2.0 classic device.  After balloon angioplasty using a 5 mm balloon, there was a residual stenosis and slight dissection, therefore I elected to stent the treated area.  Indications:  The patient has developed lifestyle limiting claudication on the left leg.  He has previously undergone right femoral popliteal bypass graft with vein and done very well.  His left saphenous vein has been harvested for CABG.  He is here today for hybrid repair with endarterectomy and atherectomy.  Procedure:  The patient was identified in the holding area and taken to [place] [address]  The patient was then placed supine on the table. general anesthesia was administered.  The patient was prepped and draped in the usual sterile fashion.  A time out was called and antibiotics were administered.  A longitudinal incision was made in the left groin.  Cautery was used to divide the subcutaneous tissue down to the femoral sheath.   The femoral sheath was then opened sharply.  I dissected out the distal external iliac artery.  The crossing iliac vein was ligated.  I then dissected out the profunda femoral artery for approximately 3 cm.  There was a lot of plaque distally but there was a soft area proximally.  It was a nerve interdigitated closely with the profunda femoral artery.  I then dissected out the superficial femoral artery for approximately 6 cm.  Was able to identify a soft area.  The entire artery was diseased.  The patient was then fully heparinized.  After the heparin circulated, the arteries were occluded with vascular clamps.  A #11 blade was used to make an arteriotomy which was extended longitudinally with Potts scissors into the proximal common femoral artery and then down onto the superficial femoral artery.  A Marlene Bast elevator was used to perform endarterectomy.  I was able to obtain a good endpoint in the superficial femoral artery.  I also was satisfied with the endpoint on the profunda femoral artery.  I then used a hemostat to get plaque out up into the distal external iliac artery.  Once I was satisfied with the endarterectomy, the endarterectomized bed was copiously irrigated making sure there was no mobile flaps.  I then removed the saphenous vein at the saphenofemoral junction, oversewing it with 5-0 Prolene.  I had approximately 6 cm of vein that was usable before it was transected from his previous CABG.  The vein was then opened it was a healthy vein.  I cut the valves.  I then performed patch angioplasty with a running  5-0 Prolene.  Prior to completion the appropriate flushing maneuvers were performed.  The anastomosis was then completed.  There was excellent flow through the common femoral artery.    Next, the patch was cannulated with an 18-gauge needle.  A 035 Bentson wire was advanced followed by a 7 French 45 cm sheath.  Using a Berenstein 2 catheter and a Glidewire, I was able to get the catheter  into the below knee popliteal artery.  A contrast injection confirmed that I was within the true lumen.  I then switched out for a viper wire.  The CSI 2.0 classic device was utilized.  Atherectomy was performed at low medium and high speed's throughout almost the entire length of the superficial femoral artery.  I then selected a 5 mm balloon and perform balloon angioplasty of the treated area taking the balloon to profile and holding it up for 1 minute.  Completion imaging revealed residual stenosis and 2 areas of non-flow-limiting dissection.  Given the extensive treated area I felt the best course of action was to treat the atherectomized superficial femoral artery.  I placed overlapping 7 mm Cordis stent.  Distally was a 7 x 1 20 followed by a 7 x 100 followed by a 7 x 60.  The proximal stent landed at the distal extent of the vein patch.  The stents were then molded using a 5 mm balloon and completion angiography was performed which showed a widely patent superficial femoral artery and excellent runoff.  At this point, the patient's heparin was reversed with protamine.  Once hemostasis was satisfactory I reapproximated the femoral sheath with 2-0 Vicryl.  The subcutaneous tissue was then closed with an additional layer of 20 and 3-0 Vicryl followed by 4-0 Vicryl the skin and then Dermabond.  There were no immediate complications.   Disposition:  To PACU in stable condition.   Theotis Burrow, M.D. [facility] Office: [phone] Pager:  [phone]

## 2015-01-29 NOTE — Progress Notes (Signed)
Utilization Review Completed.Douglas Christensen T3/01/2015  

## 2015-01-29 NOTE — Anesthesia Postprocedure Evaluation (Signed)
  Anesthesia Post-op Note  Patient: Douglas Christensen  Procedure(s) Performed: Procedure(s): ENDARTERECTOMY FEMORAL WITH PATCH ANGIOPLASTY (Left) SUPERFICIAL FEMORAL ARTERY ATHERECTOMY/PERCUTANEOUS TRANSLUMINAL ANGIOPLASTY; superficial femoral artery stent (Left)  Patient Location: PACU  Anesthesia Type:General  Level of Consciousness: awake, alert , oriented and patient cooperative  Airway and Oxygen Therapy: Patient Spontanous Breathing and Patient connected to face mask oxygen  Post-op Pain: none  Post-op Assessment: Post-op Vital signs reviewed, Patient's Cardiovascular Status Stable, Respiratory Function Stable, Patent Airway, No signs of Nausea or vomiting and Adequate PO intake  Post-op Vital Signs: Reviewed and stable  Last Vitals:  Filed Vitals:   01/29/15 1430  BP: 131/60  Pulse: 57  Temp:   Resp: 14    Complications: No apparent anesthesia complications

## 2015-01-29 NOTE — Anesthesia Preprocedure Evaluation (Addendum)
Anesthesia Evaluation  Patient identified by MRN, date of birth, ID band Patient awake    Reviewed: Allergy & Precautions, NPO status , Patient's Chart, lab work & pertinent test results, reviewed documented beta blocker date and time   Airway Mallampati: II  TM Distance: >3 FB Neck ROM: Limited    Dental  (+) Teeth Intact, Dental Advisory Given   Pulmonary COPDformer smoker,          Cardiovascular hypertension, Pt. on home beta blockers and Pt. on medications + CAD and + CABG     Neuro/Psych Anxiety    GI/Hepatic GERD-  Medicated and Controlled,  Endo/Other    Renal/GU      Musculoskeletal   Abdominal   Peds  Hematology   Anesthesia Other Findings Pt has red color to face, chest, and neck which he says is baseline. He has a splotchy pink non-raised rash on thighs which he says is getting better; it is from reaction to an antifungal of over a week ago.  Reproductive/Obstetrics                           Anesthesia Physical Anesthesia Plan  ASA: III  Anesthesia Plan: General   Post-op Pain Management:    Induction: Intravenous  Airway Management Planned: Video Laryngoscope Planned  Additional Equipment:   Intra-op Plan:   Post-operative Plan: Extubation in OR  Informed Consent: I have reviewed the patients History and Physical, chart, labs and discussed the procedure including the risks, benefits and alternatives for the proposed anesthesia with the patient or authorized representative who has indicated his/her understanding and acceptance.     Plan Discussed with: CRNA and Surgeon  Anesthesia Plan Comments:         Anesthesia Quick Evaluation

## 2015-01-29 NOTE — Transfer of Care (Signed)
Immediate Anesthesia Transfer of Care Note  Patient: Douglas Christensen  Procedure(s) Performed: Procedure(s): ENDARTERECTOMY FEMORAL WITH PATCH ANGIOPLASTY (Left) SUPERFICIAL FEMORAL ARTERY ATHERECTOMY/PERCUTANEOUS TRANSLUMINAL ANGIOPLASTY; superficial femoral artery stent (Left)  Patient Location: PACU  Anesthesia Type:General  Level of Consciousness: awake, alert  and patient cooperative  Airway & Oxygen Therapy: Patient Spontanous Breathing, Patient connected to nasal cannula oxygen and Patient connected to face mask oxygen  Post-op Assessment: Report given to RN, Post -op Vital signs reviewed and stable and Patient moving all extremities  Post vital signs: Reviewed and stable  Last Vitals:  Filed Vitals:   01/29/15 1250  BP: 119/59  Pulse: 62  Temp: 36.7 C  Resp: 20    Complications: No apparent anesthesia complications and Had pt cough , deep breathe, pharyngeal suction for some beige mucous

## 2015-01-29 NOTE — Progress Notes (Signed)
  [facility] Progress Note  01/29/2015 2:27 PM Day of Surgery  Subjective:  Just moved to 3S room. Pain with left groin incision. Mouth is dry.    Filed Vitals:   01/29/15 1414  BP:   Pulse: 55  Temp:   Resp: 10    Physical Exam: Incisions:  Left groin incision c/d/i. No hematoma. Extremities: 2+ left DP and PT pulses. 2+ right DP pulse.  Derm: diffuse erythematous rash,  currently being treated on prednisone.   Intake/Output Summary (Last 24 hours) at 01/29/15 1427 Last data filed at 01/29/15 1257  Gross per 24 hour  Intake   2900 ml  Output    847 ml  Net   2053 ml     Assessment:  64 y.o. male is s/p: left femoral endarterectomy with patch angioplasty, left superficial femoral atherectomy and angioplasty Day of Surgery  Plan: -2+ left dorsalis pedis and posterior tibial pulses. 2+ right dorsalis pedis pulse.  -Left groin incision intact without hematoma -BP stable.  -Pain control.  -D/C foley tomorrow and ambulate. -Discharge when pain controlled and ambulating well.      Virgina Jock, PA-C [facility] Office: [phone] Pager: [phone] 01/29/2015 2:27 PM

## 2015-01-29 NOTE — Interval H&P Note (Signed)
History and Physical Interval Note:  01/29/2015 7:22 AM  Douglas Christensen  has presented today for surgery, with the diagnosis of Peripheral vascular disease with left leg claudication I70.212  The various methods of treatment have been discussed with the patient and family. After consideration of risks, benefits and other options for treatment, the patient has consented to  Procedure(s): ENDARTERECTOMY FEMORAL WITH VEIN PATCH ANGIOPLASTY (Left) SUPERFICIAL FEMORAL ARTERY ATHERECTOMY/PERCUTANEOUS TRANSLUMINAL ANGIOPLASTY (Left) as a surgical intervention .  The patient's history has been reviewed, patient examined, no change in status, stable for surgery.  I have reviewed the patient's chart and labs.  Questions were answered to the patient's satisfaction.     BRABHAM IV, V. WELLS

## 2015-01-29 NOTE — Anesthesia Procedure Notes (Signed)
Procedure Name: Intubation Date/Time: 01/29/2015 7:51 AM Performed by: Terrill Mohr Pre-anesthesia Checklist: Patient identified, Emergency Drugs available, Suction available, Patient being monitored and Timeout performed Patient Re-evaluated:Patient Re-evaluated prior to inductionOxygen Delivery Method: Circle system utilized Preoxygenation: Pre-oxygenation with 100% oxygen Intubation Type: IV induction Ventilation: Mask ventilation without difficulty Laryngoscope Size: Mac and 4 (Storz changed to [employer]) Grade View: Grade III Tube type: Oral Tube size: 8.0 mm Number of attempts: 2 Airway Equipment and Method: Video-laryngoscopy and Rigid stylet Placement Confirmation: ETT inserted through vocal cords under direct vision,  positive ETCO2 and breath sounds checked- equal and bilateral (With Video Glidescope) Secured at: 21 (cm at teeth) cm Tube secured with: Tape Dental Injury: Teeth and Oropharynx as per pre-operative assessment and Dental damage  Comments: ?abrasion to bottom of right incisor?  Nothing broke off.

## 2015-01-30 ENCOUNTER — Telehealth: Payer: Self-pay | Admitting: Surgery

## 2015-01-30 LAB — CBC
HEMATOCRIT: 40.7 % (ref 39.0–52.0)
HEMOGLOBIN: 13.9 g/dL (ref 13.0–17.0)
MCH: 32.8 pg (ref 26.0–34.0)
MCHC: 34.2 g/dL (ref 30.0–36.0)
MCV: 96 fL (ref 78.0–100.0)
Platelets: 174 10*3/uL (ref 150–400)
RBC: 4.24 MIL/uL (ref 4.22–5.81)
RDW: 14.4 % (ref 11.5–15.5)
WBC: 10.5 10*3/uL (ref 4.0–10.5)

## 2015-01-30 LAB — BASIC METABOLIC PANEL
Anion gap: 6 (ref 5–15)
BUN: 14 mg/dL (ref 6–23)
CALCIUM: 7.5 mg/dL — AB (ref 8.4–10.5)
CO2: 27 mmol/L (ref 19–32)
CREATININE: 0.88 mg/dL (ref 0.50–1.35)
Chloride: 104 mmol/L (ref 96–112)
GFR calc Af Amer: >90 mL/min (ref >90)
GFR, EST NON AFRICAN AMERICAN: 90 mL/min — AB (ref >90)
GLUCOSE: 130 mg/dL — AB (ref 70–99)
Potassium: 3.4 mmol/L — ABNORMAL LOW (ref 3.5–5.1)
Sodium: 137 mmol/L (ref 135–145)

## 2015-01-30 MED ORDER — OXYCODONE HCL 5 MG PO TABS: 5.0000 mg | ORAL_TABLET | Freq: Four times a day (QID) | ORAL | Status: DC | PRN

## 2015-01-30 MED ORDER — ASPIRIN EC 81 MG PO TBEC
81.0000 mg | DELAYED_RELEASE_TABLET | Freq: Every day | ORAL | Status: DC
  Administered 2015-01-30: 81 mg via ORAL
  Filled 2015-01-30: qty 1

## 2015-01-30 MED ORDER — CLOPIDOGREL BISULFATE 75 MG PO TABS: 75.0000 mg | ORAL_TABLET | Freq: Every day | ORAL | Status: DC

## 2015-01-30 MED ORDER — ASPIRIN 81 MG PO TBEC: 81.0000 mg | DELAYED_RELEASE_TABLET | Freq: Every day | ORAL | Status: DC

## 2015-01-30 MED ORDER — ENOXAPARIN SODIUM 40 MG/0.4ML ~~LOC~~ SOLN
40.0000 mg | SUBCUTANEOUS | Status: DC
  Filled 2015-01-30: qty 0.4

## 2015-01-30 NOTE — Evaluation (Signed)
Occupational Therapy Evaluation Patient Details Name: Douglas Christensen MRN: [id number] DOB: July 05, 1951 Today's Date: 01/30/2015    History of Present Illness 64 y.o. male s/p Left external iliac, common femoral, profunda femoral, and superficial femoral endarterectomy with vein patch angioplasty, Atherectomy, left superficial femoral artery, and Stent, left superficial femoral artery.   Clinical Impression   PTA pt lived at home and was independent with ADLs. Pt is currently limited by pain with transfers in LLE and decreased ROM which impair his independence with LB ADLs. Pt will have family and neighbors assisting at d/c. Discussed bathroom setup and strategies for sit<>stand transfers due to proximity of tub. No further acute OT needs.     Follow Up Recommendations  No OT follow up;Supervision - Intermittent    Equipment Recommendations  None recommended by OT    Recommendations for Other Services       Precautions / Restrictions Precautions Precautions: Fall Restrictions Weight Bearing Restrictions: No      Mobility Bed Mobility               General bed mobility comments: In chair at start of therapy  Transfers Overall transfer level: Needs assistance Equipment used: Rolling walker (2 wheeled) Transfers: Sit to/from Stand Sit to Stand: Modified independent (Device/Increase time)         General transfer comment: requires extra time    Balance Overall balance assessment: Needs assistance Sitting-balance support: No upper extremity supported;Feet supported Sitting balance-Leahy Scale: Good     Standing balance support: No upper extremity supported Standing balance-Leahy Scale: Fair                              ADL Overall ADL's : Needs assistance/impaired Eating/Feeding: Independent;Sitting   Grooming: Supervision/safety;Standing   Upper Body Bathing: Set up;Sitting   Lower Body Bathing: Minimal assistance;Sit to/from stand   Upper Body  Dressing : Set up;Sitting   Lower Body Dressing: Moderate assistance;Sit to/from stand   Toilet Transfer: Supervision/safety;Ambulation;RW Toilet Transfer Details (indicate cue type and reason): sit<>stand from recliner Toileting- Clothing Manipulation and Hygiene: Supervision/safety;Sit to/from stand       Functional mobility during ADLs: Supervision/safety;Rolling walker       Vision Additional Comments: Pt reports no change from baseline.           Pertinent Vitals/Pain Pain Assessment: No/denies pain (brief pain with movement) Pain Score: 0-No pain Pain Location: Lt groin when mobilizing Pain Intervention(s): Monitored during session     Hand Dominance Right   Extremity/Trunk Assessment Upper Extremity Assessment Upper Extremity Assessment: Overall WFL for tasks assessed   Lower Extremity Assessment Lower Extremity Assessment: Defer to PT evaluation LLE Deficits / Details: decreased strength and ROM as expected post op   Cervical / Trunk Assessment Cervical / Trunk Assessment: Normal   Communication Communication Communication: No difficulties   Cognition Arousal/Alertness: Awake/alert Behavior During Therapy: WFL for tasks assessed/performed Overall Cognitive Status: Within Functional Limits for tasks assessed                        Exercises Exercises: General Lower Extremity          Home Living Family/patient expects to be discharged to:: Private residence Living Arrangements: Alone Available Help at Discharge: Family;Friend(s);Neighbor;Available PRN/intermittently (daughters and neighbors will assist) Type of Home: House Home Access: Stairs to enter [employer] of Steps: 4 Entrance Stairs-Rails: Right Home Layout: One level  Bathroom Shower/Tub: Tub/shower unit Shower/tub characteristics: Architectural technologist: Standard     Home Equipment: Environmental consultant - 2 wheels   Additional Comments: pt's bathroom will not fit 3N1  over toilet due to proximity of tub.      Prior Functioning/Environment Level of Independence: Independent             OT Diagnosis: Generalized weakness;Acute pain    End of Session Equipment Utilized During Treatment: Rolling walker  Activity Tolerance: Patient tolerated treatment well Patient left: in chair;with call bell/phone within reach   Time: 0947-1008 OT Time Calculation (min): 21 min Charges:  OT General Charges $OT Visit: 1 Procedure OT Evaluation $Initial OT Evaluation Tier I: 1 Procedure G-Codes:    Douglas Christensen Douglas Christensen  Douglas Christensen, OTR/L Occupational Therapist [phone] (pager)

## 2015-01-30 NOTE — Telephone Encounter (Addendum)
-----   Message from Gabriel Earing, [place] sent at 01/30/2015  7:53 AM EST ----- S/p #1: Left external iliac, common femoral, profunda femoral, and superficial femoral endarterectomy with vein patch angioplasty #2: Atherectomy, left superficial femoral artery #3: Stent, left superficial femoral artery 01/29/15.  Fu with Dr. Trula Slade in 2 weeks.  Thanks, Samantha   01/30/15: lm for pt re appt, dpm

## 2015-01-30 NOTE — Progress Notes (Signed)
  Progress Note    01/30/2015 7:37 AM 1 Day Post-Op  Subjective:  No complaints; already walked in unit  Afebrile VSS 92% RA  Filed Vitals:   01/30/15 0357  BP: 115/54  Pulse: 56  Temp: 98.4 F (36.9 C)  Resp: 15    Physical Exam: Cardiac:  regular Lungs:  Non labored Incisions:  C/d/i Extremities:  2+ palpable left DP/PT; still with rash that ws present pre-op  CBC    Component Value Date/Time   WBC 10.5 01/30/2015 0250   RBC 4.24 01/30/2015 0250   HGB 13.9 01/30/2015 0250   HCT 40.7 01/30/2015 0250   PLT 174 01/30/2015 0250   MCV 96.0 01/30/2015 0250   MCH 32.8 01/30/2015 0250   MCHC 34.2 01/30/2015 0250   RDW 14.4 01/30/2015 0250   LYMPHSABS 2.4 11/05/2014 0811   MONOABS 0.6 11/05/2014 0811   EOSABS 0.3 11/05/2014 0811   BASOSABS 0.0 11/05/2014 0811    BMET    Component Value Date/Time   NA 137 01/30/2015 0250   K 3.4* 01/30/2015 0250   CL 104 01/30/2015 0250   CO2 27 01/30/2015 0250   GLUCOSE 130* 01/30/2015 0250   GLUCOSE 101* 09/25/2006 1002   BUN 14 01/30/2015 0250   CREATININE 0.88 01/30/2015 0250   CALCIUM 7.5* 01/30/2015 0250   GFRNONAA 90* 01/30/2015 0250   GFRAA >90 01/30/2015 0250    INR    Component Value Date/Time   INR 1.01 01/20/2015 1032     Intake/Output Summary (Last 24 hours) at 01/30/15 0737 Last data filed at 01/30/15 0402  Gross per 24 hour  Intake 3732.5 ml  Output   1697 ml  Net 2035.5 ml     Assessment:  64 y.o. male is s/p:  #1: Left external iliac, common femoral, profunda femoral, and superficial femoral endarterectomy with vein patch angioplasty #2: Atherectomy, left superficial femoral artery #3: Stent, left superficial femoral artery  1 Day Post-Op  Plan: -doing well this am with palpable left DP/PT -DVT prophylaxis:  Lovenox to start this afternoon -may discharge later today or tomorrow.  Doesn't have much help at home, but does have neighbors willing to help. -will ask PT to evaluate him to see  if he requires anything extra for home -Plavix to start this am-Rx sent electronically to his pharmacy -pt needs to void since foley out -discussed with pt the importance of keeping left groin dry and clean to help prevent infection-he expressed understanding.  Will order dry gauze to left groin daily and as needed to wick moisture.   Leontine Locket, PA-C [facility] [phone] 01/30/2015 7:37 AM

## 2015-01-30 NOTE — Evaluation (Signed)
Physical Therapy Evaluation Patient Details Name: Douglas Christensen MRN: [id number] DOB: Jan 27, 1951 Today's Date: 01/30/2015   History of Present Illness  64 y.o. male s/p Left external iliac, common femoral, profunda femoral, and superficial femoral endarterectomy with vein patch angioplasty, Atherectomy, left superficial femoral artery, and Stent, left superficial femoral artery.  Clinical Impression  Patient is seen following the above procedure and presents with functional limitations due to the deficits listed below (see PT Problem List). Ambulating generally well up to 900 feet today with a rolling walker for support. Demonstrates no loss of balance or buckling during gait training. VSS throughout therapy session. States he can arrange as much help as needed from family, friends and neighbors at home. Patient will benefit from skilled PT to increase their independence and safety with mobility to allow discharge to the venue listed below.       Follow Up Recommendations Outpatient PT;Supervision - Intermittent    Equipment Recommendations  Other (comment) (Tub bench)    Recommendations for Other Services       Precautions / Restrictions Precautions Precautions: Fall Restrictions Weight Bearing Restrictions: No      Mobility  Bed Mobility               General bed mobility comments: In chair at start of therapy  Transfers Overall transfer level: Needs assistance Equipment used: Rolling walker (2 wheeled) Transfers: Sit to/from Stand Sit to Stand: Modified independent (Device/Increase time)         General transfer comment: requires extra time  Ambulation/Gait Ambulation/Gait assistance: Supervision Ambulation Distance (Feet): 900 Feet Assistive device: Rolling walker (2 wheeled) Gait Pattern/deviations: Step-through pattern;Decreased stride length;Decreased step length - right;Decreased stance time - left;Antalgic;Trunk flexed Gait velocity: decreased   General  Gait Details: Guarded gait pattern and mildly antalgic. Cues intermittently for upright posture and forward gaze. Educated on safe DME use with a rolling walker and demonstrates good control of this device by end of ambulatory distance.  Stairs            Wheelchair Mobility    Modified Rankin (Stroke Patients Only)       Balance Overall balance assessment: Needs assistance Sitting-balance support: No upper extremity supported;Feet supported Sitting balance-Leahy Scale: Good     Standing balance support: No upper extremity supported Standing balance-Leahy Scale: Fair                               Pertinent Vitals/Pain Pain Assessment: 0-10 Pain Score: 0-No pain Pain Location: Lt groin when mobilizing Pain Intervention(s): Monitored during session    Home Living Family/patient expects to be discharged to:: Private residence Living Arrangements: Alone Available Help at Discharge: Family;Friend(s);Neighbor;Available PRN/intermittently Type of Home: House Home Access: Stairs to enter Entrance Stairs-Rails: Right Entrance Stairs-Number of Steps: 4 Home Layout: One level Home Equipment: Walker - 2 wheels      Prior Function Level of Independence: Independent               Hand Dominance   Dominant Hand: Right    Extremity/Trunk Assessment   Upper Extremity Assessment: Defer to OT evaluation           Lower Extremity Assessment: LLE deficits/detail   LLE Deficits / Details: decreased strength and ROM as expected post op     Communication   Communication: No difficulties  Cognition Arousal/Alertness: Awake/alert Behavior During Therapy: WFL for tasks assessed/performed Overall Cognitive Status: Within Functional Limits  for tasks assessed                      General Comments General comments (skin integrity, edema, etc.): SpO2 93% and higher on room air. HR in 60s during ambulatory bout. BP 148/60 after ambulating.     Exercises General Exercises - Lower Extremity Ankle Circles/Pumps: AROM;Both;10 reps;Seated      Assessment/Plan    PT Assessment Patient needs continued PT services  PT Diagnosis Abnormality of gait;Difficulty walking   PT Problem List Decreased strength;Decreased range of motion;Decreased activity tolerance;Decreased balance;Decreased mobility;Decreased knowledge of use of DME;Pain  PT Treatment Interventions DME instruction;Gait training;Stair training;Functional mobility training;Therapeutic activities;Therapeutic exercise;Balance training;Neuromuscular re-education;Patient/family education;Modalities   PT Goals (Current goals can be found in the Care Plan section) Acute Rehab PT Goals Patient Stated Goal: go home PT Goal Formulation: With patient Time For Goal Achievement: 02/13/15 Potential to Achieve Goals: Good    Frequency Min 3X/week   Barriers to discharge Decreased caregiver support lives alone but has assistance near-by    Co-evaluation               End of Session   Activity Tolerance: Patient tolerated treatment well Patient left: in chair;with call bell/phone within reach Nurse Communication: Mobility status         Time: [id number] PT Time Calculation (min) (ACUTE ONLY): 25 min   Charges:   PT Evaluation $Initial PT Evaluation Tier I: 1 Procedure PT Treatments $Gait Training: 8-22 mins   PT G CodesEllouise Newer 01/30/2015, 9:20 AM Elayne Snare, [place]

## 2015-02-02 ENCOUNTER — Telehealth: Payer: Self-pay

## 2015-02-02 ENCOUNTER — Encounter (INDEPENDENT_AMBULATORY_CARE_PROVIDER_SITE_OTHER): Payer: BLUE CROSS/BLUE SHIELD

## 2015-02-02 DIAGNOSIS — I739 Peripheral vascular disease, unspecified: Secondary | ICD-10-CM

## 2015-02-02 NOTE — Telephone Encounter (Signed)
Phone call from pt.  Reported he has a lot of swelling in the left leg from groin to the foot.  Stated the left knee appears to be double in size.  Stated the swelling improves overnight, and worsens throughout the day.  Denied any localized redness/ inflammation of the left leg. Stated there is soreness in the left groin, but no noticeable swelling of the groin or increased pain.  Denies incisional drainage.  Denies fever/ chills.  Advised on importance of laying down several times/ day with left leg elevated on pillows, above level of heart with gentle bend in the knee.  Will report symptoms to Dr. Trula Slade and advise pt. with any further recommendations.

## 2015-02-02 NOTE — Telephone Encounter (Signed)
Discussed pt's. Symptoms with Dr. Trula Slade.  Recommended to have pt. get fitted for compression stockings.  Advised pt. of recommendations.  Stated he will get a ride and come tomorrow for fitting of compression hose.

## 2015-02-03 ENCOUNTER — Encounter (HOSPITAL_COMMUNITY): Payer: Self-pay | Admitting: Surgery

## 2015-02-04 ENCOUNTER — Telehealth: Payer: Self-pay

## 2015-02-04 DIAGNOSIS — G8918 Other acute postprocedural pain: Secondary | ICD-10-CM

## 2015-02-04 MED ORDER — TRAMADOL HCL 50 MG PO TABS: 50.0000 mg | ORAL_TABLET | Freq: Four times a day (QID) | ORAL | Status: DC | PRN

## 2015-02-04 NOTE — Telephone Encounter (Signed)
Phone call from pt.  Reported he has attempted to use Oxycodone for his pain, and has had hallucinations the past 2 nights.  Recalled that he had difficulty taking it for the same problem, in the past.  Stated his pain is in the left upper leg incisional area.  Stated the pain can be up to 10/10 at times.  Is requesting something less strong for pain.  Stated he was told to avoid Tylenol and Ibuprofen due to affect on blood pressure.  Discussed with Dr. Bridgett Larsson.  Authorized Tramadol 1 tab q 6 hrs/ prn pain; # 20; no refills.  Pt. advised of Tramadol being ordered.
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
## 2015-02-06 ENCOUNTER — Encounter (HOSPITAL_COMMUNITY): Payer: Self-pay | Admitting: Surgery

## 2015-02-08 ENCOUNTER — Telehealth: Payer: Self-pay | Admitting: Vascular Surgery

## 2015-02-08 NOTE — Telephone Encounter (Signed)
Pt called complaining of swelling just below his groin incision.  He denies redness or drainage.  Sounds like it may be lymphocele or seroma.  Pt had vein patch femoral endarterectomy and stent by Dr Trula Slade.  Pt will follow up in the office tomorrow  Ruta Hinds, MD [facility] [place] Office: [phone] Pager: [phone]

## 2015-02-09 ENCOUNTER — Encounter: Payer: Self-pay | Admitting: Surgery

## 2015-02-09 ENCOUNTER — Other Ambulatory Visit: Payer: Self-pay | Admitting: *Deleted

## 2015-02-09 ENCOUNTER — Ambulatory Visit (INDEPENDENT_AMBULATORY_CARE_PROVIDER_SITE_OTHER): Payer: Self-pay | Admitting: Surgery

## 2015-02-09 VITALS — BP 143/74 | HR 58 | Ht 65.0 in | Wt 186.0 lb

## 2015-02-09 DIAGNOSIS — I739 Peripheral vascular disease, unspecified: Secondary | ICD-10-CM

## 2015-02-09 DIAGNOSIS — I70219 Atherosclerosis of native arteries of extremities with intermittent claudication, unspecified extremity: Secondary | ICD-10-CM

## 2015-02-09 NOTE — Progress Notes (Signed)
The patient is back today for follow-up.  On 01/29/2015, he underwent femoral endarterectomy with vein patch angioplasty, followed by atherectomy and stenting of his left superficial femoral artery.  This was done for claudication.  Overlapping 7 mm Cordis stents were placed.  The patient did well postoperatively and was discharged to home.  His biggest complaint has been that of swelling.  He has been wearing compression stockings that are knee-high and causing some fluid retention at the knee.  His walking is better.  He does complain of a new hard area around his incision in the left groin.  On examination the patient has 2+ pitting edema of the left leg.  He has palpable pedal pulses.  The left groin incision has healed there is some scar tissue underneath with no evidence of infection.  I have reassured the patient that he is recovering nicely from his operation.  I have encouraged him to keep his leg elevated and to consider thigh-high compression stockings.  I will have him follow-up in 3 months with a duplex.  [name]

## 2015-02-13 ENCOUNTER — Other Ambulatory Visit: Payer: Self-pay | Admitting: Internal Medicine

## 2015-02-16 ENCOUNTER — Encounter: Payer: BLUE CROSS/BLUE SHIELD | Admitting: Surgery

## 2015-02-16 ENCOUNTER — Encounter (HOSPITAL_COMMUNITY): Payer: Self-pay | Admitting: Surgery

## 2015-02-25 ENCOUNTER — Other Ambulatory Visit: Payer: Self-pay | Admitting: Internal Medicine

## 2015-02-26 NOTE — Telephone Encounter (Signed)
Called pharmacy spoke with Aaron Edelman gave md approval.../lmb

## 2015-03-01 ENCOUNTER — Emergency Department (INDEPENDENT_AMBULATORY_CARE_PROVIDER_SITE_OTHER)
Admission: EM | Admit: 2015-03-01 | Discharge: 2015-03-01 | Disposition: A | Discharge status: Home / Self Care | Payer: BLUE CROSS/BLUE SHIELD | Source: Home / Self Care | Attending: Family Medicine | Admitting: Family Medicine

## 2015-03-01 ENCOUNTER — Encounter (HOSPITAL_COMMUNITY): Payer: Self-pay

## 2015-03-01 ENCOUNTER — Other Ambulatory Visit: Payer: Self-pay | Admitting: Internal Medicine

## 2015-03-01 ENCOUNTER — Emergency Department (INDEPENDENT_AMBULATORY_CARE_PROVIDER_SITE_OTHER): Payer: BLUE CROSS/BLUE SHIELD

## 2015-03-01 DIAGNOSIS — R52 Pain, unspecified: Secondary | ICD-10-CM

## 2015-03-01 DIAGNOSIS — M7072 Other bursitis of hip, left hip: Secondary | ICD-10-CM | POA: Diagnosis not present

## 2015-03-01 MED ORDER — DICLOFENAC SODIUM 1 % TD GEL
4.0000 g | Freq: Four times a day (QID) | TRANSDERMAL | Status: DC
Start: 2015-03-01 — End: 2015-03-19

## 2015-03-01 NOTE — Discharge Instructions (Signed)
Use medicine as prescribed and heat to sore area, see orthopedist if further problems.

## 2015-03-01 NOTE — ED Notes (Signed)
Patient complains of pain to his left hip which he has had for four months Patient did have by pass surgery to his left leg about a month ago Patient states the pain has gotten worse this past week and relies on a cane to help with his walking

## 2015-03-01 NOTE — ED Provider Notes (Signed)
CSN: [id number]     Arrival date & time 03/01/15  1156 History   First MD Initiated Contact with Patient 03/01/15 1208     Chief Complaint  Patient presents with  . Hip Pain    left   (Consider location/radiation/quality/duration/timing/severity/associated sxs/prior Treatment) Patient is a 64 y.o. male presenting with hip pain. The history is provided by the patient.  Hip Pain This is a chronic problem. The current episode started more than 1 week ago (60mo hx worse past week.). The problem has been gradually worsening. Pertinent negatives include no chest pain and no abdominal pain. Associated symptoms comments: Pain only with walking, no pain with sitting..    Past Medical History  Diagnosis Date  . History of colonic polyps   . COPD (chronic obstructive pulmonary disease)   . CAD (coronary artery disease)     Mild plaque (cath "years ago"); abnormal Myoview 04/2013 with subsequent CABG x 5 with LIMA to LAD, SVG to OM1, SVG to DX, SVG to PD & PL.   Marland Kitchen GERD (gastroesophageal reflux disease)   . Hyperlipidemia   . Hypertension   . Vitamin D deficiency   . Meralgia paresthetica of left side 2011  . LBP (low back pain)   . Hx of echocardiogram     Echo (9/15):  EF 55-60%; Gr 2 DD, mild BAE  . Anxiety   . PVD (peripheral vascular disease)     Stent to left common femoral and right superficial femoral.  2008.  50%  left renal   . 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 Shortness of breath     "once in awhile; can happen at anytime" (08/26/2013)   Past Surgical History  Procedure Laterality Date  . Lower extremity stents      bilateral lower extremities x 2  . Femoral-popliteal bypass graft  12/27/2012    Procedure: BYPASS GRAFT FEMORAL-POPLITEAL ARTERY;  Surgeon: Serafina Mitchell, MD;  Location: [facility] OR;  Service: Vascular;  Laterality: Right;  using non-reversed sapphenous vein.  . Cardiac catheterization  05/02/13    x2   .  Coronary artery bypass graft N/A 05/06/2013    Procedure: CORONARY ARTERY BYPASS GRAFTING (CABG);  Surgeon: Melrose Nakayama, MD;  Location: [place];  Service: Open Heart Surgery;  Laterality: N/A;  Coronary artery bypass graft times five using left internal mammary artery and left greater saphenous vein via endovein harvest.  . Lower extremity angiogram Bilateral 11/16/2011    Procedure: LOWER EXTREMITY ANGIOGRAM;  Surgeon: Sherren Mocha, MD;  Location: [facility] CATH LAB;  Service: Cardiovascular;  Laterality: Bilateral;  . Abdominal aortagram N/A 11/16/2011    Procedure: ABDOMINAL Maxcine Ham;  Surgeon: Sherren Mocha, MD;  Location: [facility] CATH LAB;  Service: Cardiovascular;  Laterality: N/A;  . Percutaneous stent intervention Left 11/16/2011    Procedure: PERCUTANEOUS STENT INTERVENTION;  Surgeon: Sherren Mocha, MD;  Location: [facility] CATH LAB;  Service: Cardiovascular;  Laterality: Left;  . Abdominal aortagram N/A 11/14/2012    Procedure: ABDOMINAL Maxcine Ham;  Surgeon: Sherren Mocha, MD;  Location: [facility] CATH LAB;  Service: Cardiovascular;  Laterality: N/A;  . Abdominal aortagram N/A 12/30/2014    Procedure: ABDOMINAL AORTAGRAM;  Surgeon: Serafina Mitchell, MD;  Location: [facility] CATH LAB;  Service: Cardiovascular;  Laterality: N/A;  . Endarterectomy femoral Left 01/29/2015    Procedure: ENDARTERECTOMY FEMORAL WITH PATCH ANGIOPLASTY;  Surgeon: Serafina Mitchell, MD;  Location: [place];  Service: Vascular;  Laterality: Left;  . Iliac atherectomy Left 01/29/2015    Procedure: SUPERFICIAL FEMORAL ARTERY ATHERECTOMY/PERCUTANEOUS TRANSLUMINAL ANGIOPLASTY; superficial femoral artery stent;  Surgeon: Serafina Mitchell, MD;  Location: [place] OR;  Service: Vascular;  Laterality: Left;   Family History  Problem Relation Age of Onset  . Heart disease Mother     No clear CAD and Heart Disease before age 57  . Hypertension Mother   . Cancer Mother     ? colon ca  . Hyperlipidemia Mother   . Cancer Father     brain tumor  . Alzheimer's  disease Father   . Colon cancer Neg Hx   . Heart attack Neg Hx    History  Substance Use Topics  . Smoking status: Former Smoker -- 2.00 packs/day for 47 years    Types: Cigarettes    Quit date: 12/17/2012  . Smokeless tobacco: Never Used  . Alcohol Use: 2.4 oz/week    4 Shots of liquor per week     Comment: 08/26/2013 "3-4 mixed drinks/wk"    Review of Systems  Cardiovascular: Negative for chest pain.  Gastrointestinal: Negative.  Negative for abdominal pain.  Musculoskeletal: Positive for gait problem. Negative for myalgias, back pain and joint swelling.  Skin: Negative.     Allergies  Itraconazole; Roxicodone; and Benazepril  Home Medications   Prior to Admission medications   Medication Sig Start Date End Date Taking? Authorizing Provider  amLODipine (NORVASC) 10 MG tablet Take 0.5 tablets (5 mg total) by mouth daily. Patient taking differently: Take 5 mg by mouth at bedtime.  11/26/13   Aleksei Plotnikov V, MD  aspirin EC 81 MG EC tablet Take 1 tablet (81 mg total) by mouth daily. 01/30/15   Ulyses Amor, PA-C  atenolol (TENORMIN) 50 MG tablet TAKE 1 TABLET BY MOUTH EVERY DAY 02/13/15   Aleksei Plotnikov V, MD  atorvastatin (LIPITOR) 20 MG tablet TAKE 1 TABLET BY MOUTH EVERY DAY 12/25/14   Aleksei Plotnikov V, MD  Avanafil (STENDRA) 100 MG TABS Take 100 mg by mouth daily as needed. 01/14/15   Aleksei Plotnikov V, MD  cholecalciferol (VITAMIN D) 1000 UNITS tablet Take 1,000 Units by mouth 2 (two) times daily.     Historical Provider, MD  clonazePAM (KLONOPIN) 0.25 MG disintegrating tablet DISSOLVE 1 TABLET TWICE DAILY AS NEEDED. 02/26/15   Lew Dawes V, MD  clopidogrel (PLAVIX) 75 MG tablet Take 1 tablet (75 mg total) by mouth daily. 01/30/15   Samantha J Rhyne, PA-C  diclofenac sodium (VOLTAREN) 1 % GEL Apply 4 g topically 4 (four) times daily. Please instruct in dosing 03/01/15   Billy Fischer, MD  hydrALAZINE (APRESOLINE) 50 MG tablet TAKE 1 TABLET BY MOUTH THREE TIMES  DAILY 12/29/14   Cassandria Anger, MD  hydrOXYzine (ATARAX/VISTARIL) 10 MG tablet Take 1 tablet (10 mg total) by mouth 3 (three) times daily as needed. 01/27/15   Hendricks Limes, MD  naproxen sodium (ANAPROX) 220 MG tablet Take 440 mg by mouth daily as needed (leg pain).    Historical Provider, MD  oxyCODONE (ROXICODONE) 5 MG immediate release tablet Take 1 tablet (5 mg total) by mouth every 6 (six) hours as needed. 01/30/15   Samantha J Rhyne, PA-C  potassium chloride SA (K-DUR,KLOR-CON) 20 MEQ tablet Take 1 tablet (20 mEq total) by mouth 2 (two) times daily. 03/14/14   [name], NP  predniSONE (DELTASONE) 20 MG tablet Take 1 tablet (20 mg total) by mouth 2 (two) times daily.  01/27/15   Hendricks Limes, MD  ranitidine (ZANTAC) 150 MG tablet Take 150 mg by mouth 2 (two) times daily.    Historical Provider, MD  traMADol (ULTRAM) 50 MG tablet Take 1 tablet (50 mg total) by mouth every 6 (six) hours as needed. 02/04/15   Conrad [place], MD  vitamin B-12 (CYANOCOBALAMIN) 1000 MCG tablet Take 1,000 mcg by mouth daily.    Historical Provider, MD   BP 158/71 mmHg  Pulse 57  Temp(Src) 98.9 F (37.2 C) (Oral)  Resp 18  SpO2 97% Physical Exam  Constitutional: He is oriented to person, place, and time. He appears well-developed and well-nourished. No distress.  Musculoskeletal: He exhibits tenderness.       Left hip: He exhibits tenderness and bony tenderness. He exhibits normal range of motion, normal strength, no swelling, no crepitus and no deformity.       Legs: Neurological: He is alert and oriented to person, place, and time.  Skin: Skin is warm and dry.  Nursing note and vitals reviewed.   ED Course  Procedures (including critical care time) Labs Review Labs Reviewed - No data to display  Imaging Review Dg Hip Unilat With Pelvis 2-3 Views Left  03/01/2015   CLINICAL DATA:  Left hip pain for 4 months, no trauma  EXAM: LEFT HIP (WITH PELVIS) 2-3 VIEWS  COMPARISON:  No recent similar  comparison  FINDINGS: Vascular stents are noted. No displaced pelvic fracture or left proximal femoral fracture identified. No dislocation. No radiopaque foreign body. Vascular calcifications are noted.  IMPRESSION: No left hip fracture or dislocation identified.   Electronically Signed   By: Conchita Paris M.D.   On: 03/01/2015 13:21   X-rays reviewed and report per radiologist.   MDM   1. Bursitis, hip, left   2. Pain aggravated by walking        Billy Fischer, MD 03/01/15 1404

## 2015-03-02 ENCOUNTER — Telehealth: Payer: Self-pay | Admitting: *Deleted

## 2015-03-02 ENCOUNTER — Encounter: Payer: Self-pay | Admitting: Internal Medicine

## 2015-03-02 ENCOUNTER — Ambulatory Visit (INDEPENDENT_AMBULATORY_CARE_PROVIDER_SITE_OTHER): Payer: BLUE CROSS/BLUE SHIELD | Admitting: Internal Medicine

## 2015-03-02 VITALS — BP 156/80 | HR 61 | Wt 188.0 lb

## 2015-03-02 DIAGNOSIS — M25552 Pain in left hip: Secondary | ICD-10-CM | POA: Insufficient documentation

## 2015-03-02 DIAGNOSIS — F411 Generalized anxiety disorder: Secondary | ICD-10-CM

## 2015-03-02 DIAGNOSIS — R609 Edema, unspecified: Secondary | ICD-10-CM

## 2015-03-02 DIAGNOSIS — I2510 Atherosclerotic heart disease of native coronary artery without angina pectoris: Secondary | ICD-10-CM

## 2015-03-02 MED ORDER — AVANAFIL 100 MG PO TABS: 100.0000 mg | ORAL_TABLET | Freq: Every day | ORAL | Status: DC | PRN

## 2015-03-02 MED ORDER — METHYLPREDNISOLONE ACETATE 80 MG/ML IJ SUSP
80.0000 mg | Freq: Once | INTRAMUSCULAR | Status: AC
  Administered 2015-03-02: 80 mg via INTRAMUSCULAR

## 2015-03-02 MED ORDER — ALPRAZOLAM 0.25 MG PO TABS
0.2500 mg | ORAL_TABLET | Freq: Two times a day (BID) | ORAL | Status: DC | PRN
Start: 2015-03-02 — End: 2015-09-24

## 2015-03-02 NOTE — Assessment & Plan Note (Signed)
Elevate LLE Compression sock

## 2015-03-02 NOTE — Telephone Encounter (Signed)
req to change Clonazepam ODT 0.25 mg to immediate release tabs for cheaper copay. Please advise.

## 2015-03-02 NOTE — Progress Notes (Signed)
C/o L hip pain He went to UC - had X ray C/o anxiety - Clonazepam is too $$$   Subjective:   Patient is a 64 year old male with known coronary disease status post CABG in June 2014 presented with brief episode of chest heaviness and shortness of breath, left arm numbness which was promptly relieved by sublingual nitroglycerin. EKG in ER showed no acute ischemic changes. Troponin were negative. Patient was admitted for observation. [place] [facility]ology was consulted.  Hypertension: Uncontrolled. Cardiology was consulted and recommended amlodipine. With small dose of nitrate, his pressure improved in the ED. Patient was continued on atenolol and placed on Norvasc 10 mg daily. He has follow-up appt with Dr Alain Marion on 09/03/2013 and cardiology on 09/10/2013 and further adjustments will be made outpatient.  Coronary artery disease with history of CABG, patient remained stable, cardiac enzymes x3 were less than 0.3. Patient had no further episodes of chest heaviness.      HPI   F/u LBP: he saw Dr Annette Stable and had a CT myelogram a few months ago - no need for surgery (2013)   The patient presents for a follow-up of  Chronic PVD hypertension, chronic dyslipidemia, CAD. He is not smoking...  S/p R bypass 2014, s/p CABG - doing better, s/p L bypass 01/29/15  BP Readings from Last 3 Encounters:  03/02/15 156/80  03/01/15 158/71  02/09/15 143/74    Wt Readings from Last 3 Encounters:  03/02/15 188 lb (85.276 kg)  02/09/15 186 lb (84.369 kg)  01/27/15 191 lb (86.637 kg)      Review of Systems  Constitutional: Negative for appetite change and unexpected weight change.  HENT: Negative for nosebleeds and trouble swallowing.   Eyes: Negative for itching and visual disturbance.  Respiratory: Positive for shortness of breath. Negative for chest tightness.   Cardiovascular: Negative for palpitations and leg swelling.  Gastrointestinal: Negative for blood in stool and abdominal distention.   Genitourinary: Negative for frequency and hematuria.  Musculoskeletal: Positive for back pain and gait problem.  Skin: Negative for color change.  Neurological: Negative for dizziness, tremors and speech difficulty.  Psychiatric/Behavioral: Negative for suicidal ideas, confusion, sleep disturbance, dysphoric mood and agitation. The patient is not nervous/anxious.        Objective:   Physical Exam  Constitutional: He is oriented to person, place, and time. He appears well-developed. No distress.  NAD  HENT:  Mouth/Throat: Oropharynx is clear and moist.  Eyes: Conjunctivae are normal. Pupils are equal, round, and reactive to light.  Neck: Normal range of motion. No JVD present. No thyromegaly present.  Cardiovascular: Normal rate, regular rhythm, normal heart sounds and intact distal pulses.  Exam reveals no gallop and no friction rub.   No murmur heard. Pulmonary/Chest: Effort normal and breath sounds normal. No respiratory distress. He has no wheezes. He has no rales. He exhibits no tenderness.  Abdominal: Soft. Bowel sounds are normal. He exhibits no distension and no mass. There is no tenderness. There is no rebound and no guarding.  Musculoskeletal: Normal range of motion. He exhibits no edema or tenderness.  Lymphadenopathy:    He has no cervical adenopathy.  Neurological: He is alert and oriented to person, place, and time. He has normal reflexes. No cranial nerve deficit. He exhibits normal muscle tone. He displays a negative Romberg sign. Coordination and gait normal.  No meningeal signs  Skin: Skin is warm and dry. No rash noted.  Psychiatric: He has a normal mood and affect. His behavior is normal.  Judgment and thought content normal.  chest and R groin scar L hip is tender laterally LLE w/1+ edema  Lab Results  Component Value Date   WBC 10.5 01/30/2015   HGB 13.9 01/30/2015   HCT 40.7 01/30/2015   PLT 174 01/30/2015   GLUCOSE 130* 01/30/2015   CHOL 207* 11/05/2014    TRIG 224.0* 11/05/2014   HDL 44.00 11/05/2014   LDLDIRECT 138.6 11/05/2014   LDLCALC 75 03/13/2014   ALT 74* 01/20/2015   AST 74* 01/20/2015   NA 137 01/30/2015   K 3.4* 01/30/2015   CL 104 01/30/2015   CREATININE 0.88 01/30/2015   BUN 14 01/30/2015   CO2 27 01/30/2015   TSH 3.78 11/05/2014   PSA 0.86 11/05/2014   INR 1.01 01/20/2015   HGBA1C 5.8* 05/03/2013       Procedure Note :     Procedure : Joint Injection,   L hip   Indication:  Trochanteric bursitis with refractory  chronic pain.   Risks including unsuccessful procedure , bleeding, infection, bruising, skin atrophy, "steroid flare-up" and others were explained to the patient in detail as well as the benefits. Informed consent was obtained and signed.   Tthe patient was placed in a comfortable lateral decubitus position. The point of maximal tenderness was identified. Skin was prepped with Betadine and alcohol. Then, a 5 cc syringe with a 2 inch long 24-gauge needle was used for a bursa injection.. The needle was advanced  Into the bursa. I injected the bursa with 4 mL of 2% lidocaine and 80 mg of Depo-Medrol .  Band-Aid was applied.   Tolerated well. Complications: None. Good pain relief following the procedure.   Postprocedure instructions :    A Band-Aid should be left on for 12 hours. Injection therapy is not a cure itself. It is used in conjunction with other modalities. You can use nonsteroidal anti-inflammatories like ibuprofen , hot and cold compresses. Rest is recommended in the next 24 hours. You need to report immediately  if fever, chills or any signs of infection develop.      Assessment & Plan:  Patient ID: OPAL CASEBEER, male   DOB: [birth year], 64 y.o.   MRN: [id number]

## 2015-03-02 NOTE — Progress Notes (Signed)
Pre visit review using our clinic review tool, if applicable. No additional management support is needed unless otherwise documented below in the visit note. 

## 2015-03-02 NOTE — Assessment & Plan Note (Signed)
Plavix, Lipitor, ASA 

## 2015-03-02 NOTE — Assessment & Plan Note (Signed)
See procedure 

## 2015-03-02 NOTE — Patient Instructions (Signed)
Postprocedure instructions :    A Band-Aid should be left on for 12 hours. Injection therapy is not a cure itself. It is used in conjunction with other modalities. You can use nonsteroidal anti-inflammatories like ibuprofen , hot and cold compresses. Rest is recommended in the next 24 hours. You need to report immediately  if fever, chills or any signs of infection develop. 

## 2015-03-02 NOTE — Assessment & Plan Note (Signed)
Change to Xanax due to cost  Potential benefits of a long term benzodiazepines  use as well as potential risks  and complications were explained to the patient and were aknowledged.

## 2015-03-02 NOTE — Telephone Encounter (Signed)
Done today - OV

## 2015-03-09 ENCOUNTER — Telehealth: Payer: Self-pay | Admitting: Internal Medicine

## 2015-03-09 NOTE — Telephone Encounter (Signed)
Patient states that the cortizone injection has not helped at all. He is still feeling pain just the same as before the injection. He is requesting information on how to proceed.

## 2015-03-10 NOTE — Telephone Encounter (Signed)
Sports med ref Dr Tamala Julian Thx

## 2015-03-11 ENCOUNTER — Telehealth: Payer: Self-pay | Admitting: Internal Medicine

## 2015-03-11 NOTE — Telephone Encounter (Signed)
lmvom offering pt an earlier appt on 4.19.16 @ 12:45p.

## 2015-03-11 NOTE — Telephone Encounter (Signed)
See phone note dated 03/11/15.

## 2015-03-11 NOTE — Telephone Encounter (Signed)
Pt was wondering if Dr. Tamala Julian can see him sooner for his problem, he said he really in pain and has a hard time walking. Pt has an appt for new pt on 03/23/15.Please advise.

## 2015-03-17 ENCOUNTER — Telehealth: Payer: Self-pay | Admitting: Internal Medicine

## 2015-03-17 ENCOUNTER — Ambulatory Visit (INDEPENDENT_AMBULATORY_CARE_PROVIDER_SITE_OTHER): Payer: BLUE CROSS/BLUE SHIELD | Admitting: Family Medicine

## 2015-03-17 ENCOUNTER — Encounter: Payer: Self-pay | Admitting: Family Medicine

## 2015-03-17 VITALS — BP 140/82 | HR 56 | Ht 65.0 in | Wt 186.0 lb

## 2015-03-17 DIAGNOSIS — M5416 Radiculopathy, lumbar region: Secondary | ICD-10-CM | POA: Diagnosis not present

## 2015-03-17 MED ORDER — PREDNISONE 20 MG PO TABS
40.0000 mg | ORAL_TABLET | Freq: Every day | ORAL | Status: DC
Start: 2015-03-17 — End: 2015-06-24

## 2015-03-17 MED ORDER — GABAPENTIN 100 MG PO CAPS: 100.0000 mg | ORAL_CAPSULE | Freq: Every day | ORAL | Status: DC

## 2015-03-17 NOTE — Assessment & Plan Note (Signed)
I believe the patient is having more of a left lumbar radiculopathy. Differential also includes piriformis syndrome no signs of a greater trochanteric bursitis. This may have been resolved from patient's injection previously. Patient has seen a neurosurgeon previously but I do not see what back surgery has been done. Patient was unable to state this year. We will discuss with patient's primary care provider. Patient has chronic pain medications already. We discussed gabapentin and I'm very small dose. We will avoid oral anti-inflammatories secondary to patient's cardiac history. Patient will be put on prednisone for the next 5 days. Patient and will come back and see me again in 10 days. If worsening symptoms or no improvement advance imaging would be necessary. Differential also includes possible hip pathology patient's x-rays were fairly unremarkable.

## 2015-03-17 NOTE — Telephone Encounter (Signed)
Can you check on  PA for amlodipine for patient.

## 2015-03-17 NOTE — Patient Instructions (Signed)
Good to meet you I think it is your back. Prednisone daily for 5 days Gabapentin 100mg  nightly  Try pennsaid twice daily Ice 20 minutes 2 times daily. Usually after activity and before bed. Exercises 3 times a week.  Wear good shoes with good ankle support for now.  Consider looking into a new mattress.  Vitamin D 2000 IU daily See me again in 10-14 days

## 2015-03-17 NOTE — Progress Notes (Signed)
Pre visit review using our clinic review tool, if applicable. No additional management support is needed unless otherwise documented below in the visit note. 

## 2015-03-17 NOTE — Progress Notes (Signed)
[name] [address] [place], [place] [address] Phone: [phone] Subjective:    I'm seeing this patient by the request  of:  [name], MD   CC: Left hip pain  [id number] Douglas Christensen is a 64 y.o. male coming in with complaint of left hip pain. Patient was seen initially and urgent care and patient did have x-rays which were fairly unremarkable. Patient then supplement care provider on April 4 and was given an injection for a greater trochanteric bursitis. Patient states the injection did not help. Patient states that the pain of anything is worsening. Seems to still be worse after sitting a long amount of time and then worse with ambulation. Patient states the pain is mostly on the posterior lateral aspect of the hip. No significant findings anteriorly states. Patient states though that unfortunately he is now walking with a cane. No significant radiation down the leg. We did not consider the weakness. Patient rates the severity pain is 8 out of 10. Describes it as a dull throbbing ache. No nighttime awakening. Patient does have a past medical history significant for chronic low back pain the recent CT myelogram he states was fairly unremarkable. Did not see what problem he is been treated for.     Past Medical History  Diagnosis Date  . History of colonic polyps   . COPD (chronic obstructive pulmonary disease)   . CAD (coronary artery disease)     Mild plaque (cath "years ago"); abnormal Myoview 04/2013 with subsequent CABG x 5 with LIMA to LAD, SVG to OM1, SVG to DX, SVG to PD & PL.   Marland Kitchen GERD (gastroesophageal reflux disease)   . Hyperlipidemia   . Hypertension   . Vitamin D deficiency   . Meralgia paresthetica of left side 2011  . LBP (low back pain)   . Hx of echocardiogram     Echo (9/15):  EF 55-60%; Gr 2 DD, mild BAE  . Anxiety   . PVD (peripheral vascular disease)     Stent to left common femoral and right superficial femoral.  2008.   50%  left renal   . 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 Shortness of breath     "once in awhile; can happen at anytime" (08/26/2013)   Past Surgical History  Procedure Laterality Date  . Lower extremity stents      bilateral lower extremities x 2  . Femoral-popliteal bypass graft  12/27/2012    Procedure: BYPASS GRAFT FEMORAL-POPLITEAL ARTERY;  Surgeon: Serafina Mitchell, MD;  Location: [facility] OR;  Service: Vascular;  Laterality: Right;  using non-reversed sapphenous vein.  . Cardiac catheterization  05/02/13    x2   . Coronary artery bypass graft N/A 05/06/2013    Procedure: CORONARY ARTERY BYPASS GRAFTING (CABG);  Surgeon: Melrose Nakayama, MD;  Location: [facility];  Service: Open Heart Surgery;  Laterality: N/A;  Coronary artery bypass graft times five using left internal mammary artery and left greater saphenous vein via endovein harvest.  . Lower extremity angiogram Bilateral 11/16/2011    Procedure: LOWER EXTREMITY ANGIOGRAM;  Surgeon: Sherren Mocha, MD;  Location: [facility] CATH LAB;  Service: Cardiovascular;  Laterality: Bilateral;  . Abdominal aortagram N/A 11/16/2011    Procedure: ABDOMINAL Maxcine Ham;  Surgeon: Sherren Mocha, MD;  Location: [facility] C[facility] LAB;  Service: Cardiovascular;  Laterality: N/A;  . Percutaneous stent intervention Left 11/16/2011  Procedure: PERCUTANEOUS STENT INTERVENTION;  Surgeon: Sherren Mocha, MD;  Location: [facility] CATH LAB;  Service: Cardiovascular;  Laterality: Left;  . Abdominal aortagram N/A 11/14/2012    Procedure: ABDOMINAL Maxcine Ham;  Surgeon: Sherren Mocha, MD;  Location: [facility] CATH LAB;  Service: Cardiovascular;  Laterality: N/A;  . Abdominal aortagram N/A 12/30/2014    Procedure: ABDOMINAL AORTAGRAM;  Surgeon: Serafina Mitchell, MD;  Location: [facility] CATH LAB;  Service: Cardiovascular;  Laterality: N/A;  . Endarterectomy femoral Left 01/29/2015    Procedure: ENDARTERECTOMY FEMORAL WITH PATCH ANGIOPLASTY;  Surgeon:  Serafina Mitchell, MD;  Location: [place];  Service: Vascular;  Laterality: Left;  . Iliac atherectomy Left 01/29/2015    Procedure: SUPERFICIAL FEMORAL ARTERY ATHERECTOMY/PERCUTANEOUS TRANSLUMINAL ANGIOPLASTY; superficial femoral artery stent;  Surgeon: Serafina Mitchell, MD;  Location: [place];  Service: Vascular;  Laterality: Left;   History   Social History  . Marital Status: Widowed    Spouse Name: N/A  . Number of Children: 2  . Years of Education: N/A   Occupational History  . [employer]   Social History Main Topics  . Smoking status: Former Smoker -- 2.00 packs/day for 47 years    Types: Cigarettes    Quit date: 12/17/2012  . Smokeless tobacco: Never Used  . Alcohol Use: 2.4 oz/week    4 Shots of liquor per week     Comment: 08/26/2013 "3-4 mixed drinks/wk"  . Drug Use: No  . Sexual Activity: Yes   Other Topics Concern  . Not on file   Social History Narrative   Widower   Family History  Problem Relation Age of Onset  . Heart disease Mother     No clear CAD and Heart Disease before age 65  . Hypertension Mother   . Cancer Mother     ? colon ca  . Hyperlipidemia Mother   . Cancer Father     brain tumor  . Alzheimer's disease Father   . Colon cancer Neg Hx   . Heart attack Neg Hx    Allergies  Allergen Reactions  . Itraconazole Nausea Only and Rash  . Roxicodone [Oxycodone Hcl] Other (See Comments)    hallucinations  . Benazepril Cough    Past medical history, social, surgical and family history all reviewed in electronic medical record.   Review of Systems: No headache, visual changes, nausea, vomiting, diarrhea, constipation, dizziness, abdominal pain, skin rash, fevers, chills, night sweats, weight loss, swollen lymph nodes, body aches, joint swelling, muscle aches, chest pain, shortness of breath, mood changes.   Objective Blood pressure 140/82, pulse 56, height 5\' 5"  (1.651 m), weight 186 lb (84.369 kg), SpO2 96 %.  General: No apparent  distress alert and oriented x3 mood and affect normal, dressed appropriately.  HEENT: Pupils equal, extraocular movements intact  Respiratory: Patient's speak in full sentences and does not appear short of breath  Cardiovascular: No lower extremity edema, non tender, no erythema  Skin: Warm dry intact with no signs of infection or rash on extremities or on axial skeleton.  Abdomen: Soft nontender  Neuro: Cranial nerves II through XII are intact, neurovascularly intact in all extremities with 2+ DTRs and 2+ pulses.  Lymph: No lymphadenopathy of posterior or anterior cervical chain or axillae bilaterally.  Gait severe antalgic gait with external rotation of the leg..  MSK:  Non tender with full range of motion and good stability and symmetric strength and tone of shoulders, elbows, wrist, knee and ankles bilaterally.  Hip: left ROM  IR: 25 Deg, ER: 45 Deg, Flexion: 110 Deg, Extension: 100 Deg, Abduction: 35 Deg, Adduction: 25 Deg Strength IR: 4/5, ER: 5/5, Flexion: 4/5, Extension: 5/5, Abduction: 3+/5, Adduction: 5/5 compared to full strength on the contralateral side Pelvic alignment unremarkable to inspection and palpation. Standing hip rotation and gait without trendelenburg sign / unsteadiness. Greater trochanter without tenderness to palpation. His have tenderness over the piriformis and mild diffuse tenderness over the posterior lateral hip but no specific finding. No pain with FABER or FADIR. No SI joint tenderness and normal minimal SI movement. Straight leg test positive left  MSK US performed of: Left hip This study was ordered, performed, and interpreted by Charlann Boxer D.O.  Hip: Trochanteric bursa without swelling or effusion. Acetabular labrum visualized with mild to moderate osteophytic changes Femoral neck appears unremarkable without increased power doppler signal along Cortex. No bursitis noted over the lateral aspect of the hip  IMPRESSION:  NORMAL ULTRASONOGRAPHIC  EXAMINATION OF THE HIP. Moderate arthritis   Procedure note [id number]; 15 minutes spent for Therapeutic exercises as stated in above notes.  This included exercises focusing on stretching, strengthening, with significant focus on eccentric aspects.  Patient's back exercises including range of motion, forward flexion as well as some extension exercises and core strengthening. Proper technique shown and discussed handout in great detail with ATC.  All questions were discussed and answered.      Impression and Recommendations:     This case required medical decision making of moderate complexity.

## 2015-03-19 NOTE — Telephone Encounter (Signed)
Pt informed

## 2015-03-19 NOTE — Telephone Encounter (Signed)
Per Dr Thompson Caul recommendation - stretch. Use Tramadol, Gabapentin prn Thx

## 2015-03-19 NOTE — Telephone Encounter (Signed)
PA is needed for Voltaren. ID# [id number]   Ph # [phone]. This med was prescribed in ED on 03/01/15 for Left hip bursitis. What can he take in place of Votaren 1%?

## 2015-03-23 ENCOUNTER — Ambulatory Visit: Payer: BLUE CROSS/BLUE SHIELD | Admitting: Family Medicine

## 2015-03-24 ENCOUNTER — Telehealth: Payer: Self-pay | Admitting: *Deleted

## 2015-03-24 NOTE — Telephone Encounter (Addendum)
Pt called states Dr. Blenda Mounts gave him a medication he can not take, and we cancelled his July appt and he would like to know what he needs to do.  Pt only left his name and birthdate, but no phone number.  I reviewed our [facility] pt listing and found his name and phone number.  I called the pt to discuss the medication reaction and reschedule appt and pt hung up the phone.  I called the pt again and asked the name of the medication he had a reaction, pt states he had a rash to the antifungal medication.  I asked if it was the Itraconazole and he said yes.  I told pt not to take anymore and I will have the schedulers call to schedule.

## 2015-03-25 ENCOUNTER — Encounter: Payer: Self-pay | Admitting: Family Medicine

## 2015-03-25 ENCOUNTER — Ambulatory Visit (INDEPENDENT_AMBULATORY_CARE_PROVIDER_SITE_OTHER): Payer: BLUE CROSS/BLUE SHIELD | Admitting: Family Medicine

## 2015-03-25 VITALS — BP 122/64 | HR 71 | Ht 65.0 in | Wt 186.0 lb

## 2015-03-25 DIAGNOSIS — M5416 Radiculopathy, lumbar region: Secondary | ICD-10-CM

## 2015-03-25 NOTE — Telephone Encounter (Signed)
Scheduled patient to see Dr. Paulla Dolly this Friday 04/29 at 1:45pm.

## 2015-03-25 NOTE — Progress Notes (Signed)
Pre visit review using our clinic review tool, if applicable. No additional management support is needed unless otherwise documented below in the visit note. 

## 2015-03-25 NOTE — Progress Notes (Signed)
[name] [place], [place] [address] Phone: [phone] Subjective:    I'm seeing this patient by the request  of:  [name], MD   CC: Left hip pain  [id number] Douglas Christensen is a 64 y.o. male coming in with complaint of left hip pain. Patient was seen initially and urgent care and patient did have x-rays which were fairly unremarkable. Patient then supplement care provider on April 4 and was given an injection for a greater trochanteric bursitis. 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 Patient continued to have pain even with using the tramadol, and gabapentin for the potential for lumbar radiculopathy with patient's history. Patient states the pain was worsening and was having significant pain where he was not able to angulate. His does seem to be more of the anterior aspect of the thigh groin as well as lower back. Patient states though today he woke up and is doing significantly much better. Patient feels that he is near his baseline. Patient does not know what caused exacerbation previously. Patient states that the gabapentin is helping him with sleep and may have started kicking in disease ointment taken for the last 2 days.     Past Medical History  Diagnosis Date  . History of colonic polyps   . COPD (chronic obstructive pulmonary disease)   . CAD (coronary artery disease)     Mild plaque (cath "years ago"); abnormal Myoview 04/2013 with subsequent CABG x 5 with LIMA to LAD, SVG to OM1, SVG to DX, SVG to PD & PL.   Marland Kitchen GERD (gastroesophageal reflux disease)   . Hyperlipidemia   . Hypertension   . Vitamin D deficiency   . Meralgia paresthetica of left side 2011  . LBP (low back pain)   . Hx of echocardiogram     Echo (9/15):  EF 55-60%; Gr 2 DD, mild BAE  . Anxiety   . PVD (peripheral vascular disease)     Stent to left common femoral and right superficial femoral.  2008.   50%  left renal   . 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 Shortness of breath     "once in awhile; can happen at anytime" (08/26/2013)   Past Surgical History  Procedure Laterality Date  . Lower extremity stents      bilateral lower extremities x 2  . Femoral-popliteal bypass graft  12/27/2012    Procedure: BYPASS GRAFT FEMORAL-POPLITEAL ARTERY;  Surgeon: Serafina Mitchell, MD;  [facility];  Service: Vascular;  Laterality: Right;  using non-reversed sapphenous vein.  . Cardiac catheterization  05/02/13    x2   . Coronary artery bypass graft N/A 05/06/2013    Procedure: CORONARY ARTERY BYPASS GRAFTING (CABG);  Surgeon: Melrose Nakayama, MD;  Location: [facility];  Service: Open Heart Surgery;  Laterality: N/A;  Coronary artery bypass graft times five using left internal mammary artery and left greater saphenous vein via endovein harvest.  . Lower extremity angiogram Bilateral 11/16/2011    Procedure: LOWER EXTREMITY ANGIOGRAM;  Surgeon: Sherren Mocha, MD;  Location: [facility] CATH LAB;  Service: Cardiovascular;  Laterality: Bilateral;  . Abdominal aortagram N/A 11/16/2011    Procedure: ABDOMINAL Maxcine Ham;  Surgeon: Sherren Mocha, MD;  Location: [facility] CATH LAB;  Service: Cardiovascular;  Laterality: N/A;  . Percutaneous stent intervention Left 11/16/2011    Procedure:  PERCUTANEOUS STENT INTERVENTION;  Surgeon: Sherren Mocha, MD;  Location: [facility] CATH LAB;  Service: Cardiovascular;  Laterality: Left;  . Abdominal aortagram N/A 11/14/2012    Procedure: ABDOMINAL Maxcine Ham;  Surgeon: Sherren Mocha, MD;  Location: [facility] CATH LAB;  Service: Cardiovascular;  Laterality: N/A;  . Abdominal aortagram N/A 12/30/2014    Procedure: ABDOMINAL AORTAGRAM;  Surgeon: Serafina Mitchell, MD;  Location: [facility] CATH LAB;  Service: Cardiovascular;  Laterality: N/A;  . Endarterectomy femoral Left 01/29/2015    Procedure: ENDARTERECTOMY FEMORAL WITH PATCH ANGIOPLASTY;  Surgeon: Serafina Mitchell, MD;  Location: [facility];  Service: Vascular;  Laterality: Left;  . Iliac atherectomy Left 01/29/2015    Procedure: SUPERFICIAL FEMORAL ARTERY ATHERECTOMY/PERCUTANEOUS TRANSLUMINAL ANGIOPLASTY; superficial femoral artery stent;  Surgeon: Serafina Mitchell, MD;  Location: [place];  Service: Vascular;  Laterality: Left;   History   Social History  . Marital Status: Widowed    Spouse Name: N/A  . Number of Children: 2  . Years of Education: N/A   Occupational History  . [employer]   Social History Main Topics  . Smoking status: Former Smoker -- 2.00 packs/day for 47 years    Types: Cigarettes    Quit date: 12/17/2012  . Smokeless tobacco: Never Used  . Alcohol Use: 2.4 oz/week    4 Shots of liquor per week     Comment: 08/26/2013 "3-4 mixed drinks/wk"  . Drug Use: No  . Sexual Activity: Yes   Other Topics Concern  . Not on file   Social History Narrative   Widower   Family History  Problem Relation Age of Onset  . Heart disease Mother     No clear CAD and Heart Disease before age 63  . Hypertension Mother   . Cancer Mother     ? colon ca  . Hyperlipidemia Mother   . Cancer Father     brain tumor  . Alzheimer's disease Father   . Colon cancer Neg Hx   . Heart attack Neg Hx    Allergies  Allergen Reactions  . Itraconazole Nausea Only and Rash  . Roxicodone [Oxycodone Hcl] Other (See Comments)    hallucinations  . Benazepril Cough    Past medical history, social, surgical and family history all reviewed in electronic medical record.   Review of Systems: No headache, visual changes, nausea, vomiting, diarrhea, constipation, dizziness, abdominal pain, skin rash, fevers, chills, night sweats, weight loss, swollen lymph nodes, body aches, joint swelling, muscle aches, chest pain, shortness of breath, mood changes.   Objective Blood pressure 122/64, pulse 71, height 5\' 5"  (1.651 m), weight 186 lb (84.369 kg), SpO2 96 %.  General: No apparent distress  alert and oriented x3 mood and affect normal, dressed appropriately.  HEENT: Pupils equal, extraocular movements intact  Respiratory: Patient's speak in full sentences and does not appear short of breath  Cardiovascular: No lower extremity edema, non tender, no erythema  Skin: Warm dry intact with no signs of infection or rash on extremities or on axial skeleton.  Abdomen: Soft nontender  Neuro: Cranial nerves II through XII are intact, neurovascularly intact in all extremities with 2+ DTRs and 2+ pulses.  Lymph: No lymphadenopathy of posterior or anterior cervical chain or axillae bilaterally.  Gait severe antalgic gait with external rotation of the leg..  MSK:  Non tender with full range of motion and good stability and symmetric strength and tone of shoulders, elbows, wrist, knee and ankles bilaterally.  Hip: left ROM IR:  25 Deg, ER: 45 Deg, Flexion: 110 Deg, Extension: 100 Deg, Abduction: 35 Deg, Adduction: 25 Deg Strength IR: 4/5, ER: 5/5, Flexion: 4/5, Extension: 5/5, Abduction: 3+/5, Adduction: 5/5 compared to full strength on the contralateral side Pelvic alignment unremarkable to inspection and palpation. Standing hip rotation and gait without trendelenburg sign / unsteadiness. Greater trochanter without tenderness to palpation. His have tenderness over the piriformis and mild diffuse tenderness over the posterior lateral hip but no specific finding. No pain with FABER or FADIR. No SI joint tenderness and normal minimal SI movement. Straight leg test positive left still present        Impression and Recommendations:     This case required medical decision making of moderate complexity.

## 2015-03-25 NOTE — Assessment & Plan Note (Signed)
I do believe that patient's pain is still coming from the lumbar pathology. Differential also includes intra-articular hip. We discussed the possibility of an injection in the hip for diagnostic and therapeutic purposes which patient declined. Patient is doing much better today and we'll start with the conservative therapy we discussed previously. We discussed icing regimen and home exercises again in greater detail. Patient will start to make these different changes and come back and see me again as scheduled in 3 weeks. If worsening symptoms he will call me. Only change remained was to increase patient's gabapentin to 200 mg at night.  Spent  25 minutes with patient face-to-face and had greater than 50% of counseling including as described above in assessment and plan.

## 2015-03-25 NOTE — Patient Instructions (Addendum)
Good to see you Ice is your friend Continue with plan Increase gabapentin to 200mg  at night See me again in 3 weeks or call earlier.

## 2015-03-26 ENCOUNTER — Other Ambulatory Visit: Payer: Self-pay | Admitting: Internal Medicine

## 2015-03-27 ENCOUNTER — Ambulatory Visit: Payer: BLUE CROSS/BLUE SHIELD | Admitting: Podiatry

## 2015-03-30 ENCOUNTER — Ambulatory Visit: Payer: BLUE CROSS/BLUE SHIELD | Admitting: Family Medicine

## 2015-03-30 DIAGNOSIS — Z0289 Encounter for other administrative examinations: Secondary | ICD-10-CM

## 2015-04-06 ENCOUNTER — Telehealth: Payer: Self-pay | Admitting: Family Medicine

## 2015-04-06 DIAGNOSIS — M5416 Radiculopathy, lumbar region: Secondary | ICD-10-CM

## 2015-04-06 NOTE — Telephone Encounter (Signed)
Tell him he can come in for a hip injection to rule that out or if he feels it is his back we do new a new CT myelogram of his back. His pick.

## 2015-04-06 NOTE — Telephone Encounter (Signed)
Spoke to pt, he decided to go ahead & have the CT done. Order entered.

## 2015-04-06 NOTE — Telephone Encounter (Signed)
Treatment is not working at for him. He's wondering if you may want him to get an MRI. Please advise patient.

## 2015-04-10 ENCOUNTER — Telehealth: Payer: Self-pay

## 2015-04-10 NOTE — Telephone Encounter (Signed)
Phone call rec'd from [facility].  Stated pt. is to be scheduled for a Myelogram.  Requested approval from Dr. Trula Slade to hold pt's Plavix 5 days prior to procedure.  Discussed with Dr. Lawson Fiscal approval to hold Plavix 5 days prior to Myelogram.  Will fax to [facility].

## 2015-04-16 ENCOUNTER — Other Ambulatory Visit: Payer: BLUE CROSS/BLUE SHIELD

## 2015-04-20 ENCOUNTER — Ambulatory Visit
Admission: RE | Admit: 2015-04-20 | Discharge: 2015-04-20 | Disposition: A | Discharge status: Home / Self Care | Payer: BLUE CROSS/BLUE SHIELD | Source: Ambulatory Visit | Attending: Family Medicine | Admitting: Family Medicine

## 2015-04-20 ENCOUNTER — Other Ambulatory Visit: Payer: Self-pay | Admitting: Family Medicine

## 2015-04-20 VITALS — BP 159/68 | HR 66

## 2015-04-20 DIAGNOSIS — M5416 Radiculopathy, lumbar region: Secondary | ICD-10-CM

## 2015-04-20 DIAGNOSIS — M5442 Lumbago with sciatica, left side: Secondary | ICD-10-CM

## 2015-04-20 MED ORDER — IOHEXOL 180 MG/ML  SOLN
15.0000 mL | Freq: Once | INTRAMUSCULAR | Status: AC | PRN
  Administered 2015-04-20: 15 mL via INTRATHECAL

## 2015-04-20 MED ORDER — DIAZEPAM 5 MG PO TABS
10.0000 mg | ORAL_TABLET | Freq: Once | ORAL | Status: AC
  Administered 2015-04-20: 10 mg via ORAL

## 2015-04-20 NOTE — Progress Notes (Signed)
Patient states he has been off Plavix for the past two weeks and off Tramadol for the past four days.  jk

## 2015-04-20 NOTE — Discharge Instructions (Signed)
Myelogram Discharge Instructions  1. Go home and rest quietly for the next 24 hours.  It is important to lie flat for the next 24 hours.  Get up only to go to the restroom.  You may lie in the bed or on a couch on your back, your stomach, your left side or your right side.  You may have one pillow under your head.  You may have pillows between your knees while you are on your side or under your knees while you are on your back.  2. DO NOT drive today.  Recline the seat as far back as it will go, while still wearing your seat belt, on the way home.  3. You may get up to go to the bathroom as needed.  You may sit up for 10 minutes to eat.  You may resume your normal diet and medications unless otherwise indicated.  Drink plenty of extra fluids today and tomorrow.  4. The incidence of a spinal headache with nausea and/or vomiting is about 5% (one in 20 patients).  If you develop a headache, lie flat and drink plenty of fluids until the headache goes away.  Caffeinated beverages may be helpful.  If you develop severe nausea and vomiting or a headache that does not go away with flat bed rest, call [phone].  5. You may resume normal activities after your 24 hours of bed rest is over; however, do not exert yourself strongly or do any heavy lifting tomorrow.  6. Call your physician for a follow-up appointment.   You may resume Plavix today.  You may resume Tramadol on Tuesday, Apr 21, 2015 after 8:00a.m.

## 2015-04-21 ENCOUNTER — Telehealth: Payer: Self-pay | Admitting: Internal Medicine

## 2015-04-21 DIAGNOSIS — M5416 Radiculopathy, lumbar region: Secondary | ICD-10-CM

## 2015-04-21 NOTE — Telephone Encounter (Signed)
Sent in my chart message.  progression noted and can try facet injection and/or epidural aL5-S1 if continued pain.

## 2015-04-21 NOTE — Telephone Encounter (Signed)
Pt called in requesting call back from Dr Tamala Julian about his result from his test that he had done yesterday   Best number [phone]

## 2015-04-21 NOTE — Telephone Encounter (Signed)
lmovm for pt to return call.  

## 2015-04-21 NOTE — Telephone Encounter (Signed)
Spoke to pt, he would like to go ahead with the injection.  Order entered.

## 2015-04-28 ENCOUNTER — Ambulatory Visit
Admission: RE | Admit: 2015-04-28 | Discharge: 2015-04-28 | Disposition: A | Discharge status: Home / Self Care | Payer: BLUE CROSS/BLUE SHIELD | Source: Ambulatory Visit | Attending: Family Medicine | Admitting: Family Medicine

## 2015-04-28 DIAGNOSIS — M5416 Radiculopathy, lumbar region: Secondary | ICD-10-CM

## 2015-04-28 MED ORDER — METHYLPREDNISOLONE ACETATE 40 MG/ML INJ SUSP (RADIOLOG
120.0000 mg | Freq: Once | INTRAMUSCULAR | Status: AC
  Administered 2015-04-28: 120 mg via EPIDURAL

## 2015-04-28 MED ORDER — IOHEXOL 180 MG/ML  SOLN
1.0000 mL | Freq: Once | INTRAMUSCULAR | Status: AC | PRN
  Administered 2015-04-28: 1 mL via EPIDURAL

## 2015-04-28 NOTE — Discharge Instructions (Signed)

## 2015-05-01 ENCOUNTER — Telehealth: Payer: Self-pay | Admitting: Family Medicine

## 2015-05-01 DIAGNOSIS — M5416 Radiculopathy, lumbar region: Secondary | ICD-10-CM

## 2015-05-01 NOTE — Telephone Encounter (Signed)
Is requesting a follow up call to be given to patient since Dr. Tamala Julian is not in office.  States Dr. Tamala Julian referred for a epidural steroid injection on Tuesday.  States patient is in pain and can't sleep.

## 2015-05-04 NOTE — Telephone Encounter (Signed)
Can we see what is going on and ask if he responded to epidural at all.  Thank you

## 2015-05-04 NOTE — Telephone Encounter (Signed)
Thedore Mins, Could you address please? Thx

## 2015-05-04 NOTE — Telephone Encounter (Signed)
Spoke to pt, order entered. Called danielle @ [facility], left msg for her to return call to see if pt can be scheduled sooner.

## 2015-05-06 NOTE — Telephone Encounter (Signed)
Left detailed msg on pt's vmail that we have sent all of the info to [facility]. i provided him with their phone number for him to call to schedule the appt.

## 2015-05-06 NOTE — Telephone Encounter (Signed)
Patient states he has not heard anything from [facility].

## 2015-05-08 ENCOUNTER — Ambulatory Visit
Admission: RE | Admit: 2015-05-08 | Discharge: 2015-05-08 | Disposition: A | Discharge status: Home / Self Care | Payer: BLUE CROSS/BLUE SHIELD | Source: Ambulatory Visit | Attending: Family Medicine | Admitting: Family Medicine

## 2015-05-08 ENCOUNTER — Other Ambulatory Visit: Payer: Self-pay | Admitting: Family Medicine

## 2015-05-08 DIAGNOSIS — M5416 Radiculopathy, lumbar region: Secondary | ICD-10-CM

## 2015-05-08 MED ORDER — METHYLPREDNISOLONE ACETATE 40 MG/ML INJ SUSP (RADIOLOG
120.0000 mg | Freq: Once | INTRAMUSCULAR | Status: AC
  Administered 2015-05-08: 120 mg via INTRA_ARTICULAR

## 2015-05-08 MED ORDER — IOHEXOL 180 MG/ML  SOLN
1.0000 mL | Freq: Once | INTRAMUSCULAR | Status: AC | PRN
  Administered 2015-05-08: 1 mL via INTRA_ARTICULAR

## 2015-05-11 ENCOUNTER — Telehealth: Payer: Self-pay | Admitting: Radiology

## 2015-05-11 ENCOUNTER — Telehealth: Payer: Self-pay | Admitting: Internal Medicine

## 2015-05-11 ENCOUNTER — Telehealth: Payer: Self-pay | Admitting: Family Medicine

## 2015-05-11 DIAGNOSIS — M5416 Radiculopathy, lumbar region: Secondary | ICD-10-CM

## 2015-05-11 NOTE — Telephone Encounter (Signed)
Pt states he has worse pain since injection. Pt also states he would need to see someone else if he got no better. Pt states he called Dr. Tamala Julian told him to call us. Unable to give pt any answers and referred him to Dr. Gerilyn Pilgrim.

## 2015-05-11 NOTE — Telephone Encounter (Signed)
Order entered.  Spoke to pt, advised him the order has been sent to [facility] as stat. If any bowel, bladder problems, or if symptoms get worse he needs to go to the ER. Pt understood.

## 2015-05-11 NOTE — Telephone Encounter (Signed)
States he spoke with Dr. Tamala Julian this morning and Dr. Tamala Julian directed him to [facility].  [facility] states patient needs to be referred to a neurosurgeon.  Patient states he can not walk.  He is requesting referral to be entered.

## 2015-05-12 ENCOUNTER — Encounter: Payer: Self-pay | Admitting: Surgery

## 2015-05-18 ENCOUNTER — Ambulatory Visit (HOSPITAL_COMMUNITY): Payer: BLUE CROSS/BLUE SHIELD

## 2015-05-18 ENCOUNTER — Ambulatory Visit: Payer: BLUE CROSS/BLUE SHIELD | Admitting: Surgery

## 2015-05-18 ENCOUNTER — Encounter (HOSPITAL_COMMUNITY): Payer: BLUE CROSS/BLUE SHIELD

## 2015-05-19 ENCOUNTER — Telehealth: Payer: Self-pay | Admitting: Family Medicine

## 2015-05-19 DIAGNOSIS — M25562 Pain in left knee: Secondary | ICD-10-CM

## 2015-05-19 NOTE — Telephone Encounter (Signed)
Spoke to pt, he would like to try a hip injection. He is coming in tomorrow & will get x-rays.  Order entered.

## 2015-05-19 NOTE — Telephone Encounter (Signed)
Dr. Arnoldo Morale took xrays of his hip yesterday and wanted to make sure you got the xrays. Please call if there is any problems

## 2015-05-19 NOTE — Telephone Encounter (Signed)
If patient would like patient has very mild osteophytic changes of the hip seen on x-ray in April. We can try an injection in the hip and see if this will be beneficial. If patient does decide to command let's also get an x-ray of patient's left knee to see if that would possibly be giving him some difficulty as well.

## 2015-05-19 NOTE — Telephone Encounter (Signed)
Spoke to pt, he states that neither one of the epidural injections helped at all. He stated that Dr. Arnoldo Morale discussed his options with him & he stated that there was a 50/50 chance it was coming from his back & there was a 50/50 chance that surgery would help. The pt stated that Dr. Arnoldo Morale told him that he thinks his problem is coming from his hip or knee. The pt is currently walking with a walker.

## 2015-05-19 NOTE — Telephone Encounter (Signed)
Patient went to see the Neurosurgeon and they told him that it was a 50/50 chance that it is the problem. He needs to know what to do next.

## 2015-05-19 NOTE — Telephone Encounter (Signed)
If the injection helped, then it is the problem.  We can eitherrepeat one more injection or he needs surgery

## 2015-05-20 ENCOUNTER — Ambulatory Visit (INDEPENDENT_AMBULATORY_CARE_PROVIDER_SITE_OTHER): Payer: BLUE CROSS/BLUE SHIELD | Admitting: Family Medicine

## 2015-05-20 ENCOUNTER — Ambulatory Visit (INDEPENDENT_AMBULATORY_CARE_PROVIDER_SITE_OTHER)
Admission: RE | Admit: 2015-05-20 | Discharge: 2015-05-20 | Disposition: A | Discharge status: Home / Self Care | Payer: BLUE CROSS/BLUE SHIELD | Source: Ambulatory Visit | Attending: Family Medicine | Admitting: Family Medicine

## 2015-05-20 ENCOUNTER — Other Ambulatory Visit (INDEPENDENT_AMBULATORY_CARE_PROVIDER_SITE_OTHER): Payer: BLUE CROSS/BLUE SHIELD

## 2015-05-20 ENCOUNTER — Encounter: Payer: Self-pay | Admitting: Family Medicine

## 2015-05-20 VITALS — BP 152/70 | HR 69 | Ht 65.0 in | Wt 186.0 lb

## 2015-05-20 DIAGNOSIS — M1990 Unspecified osteoarthritis, unspecified site: Secondary | ICD-10-CM | POA: Diagnosis not present

## 2015-05-20 DIAGNOSIS — M25552 Pain in left hip: Secondary | ICD-10-CM

## 2015-05-20 DIAGNOSIS — M25562 Pain in left knee: Secondary | ICD-10-CM | POA: Diagnosis not present

## 2015-05-20 DIAGNOSIS — M1612 Unilateral primary osteoarthritis, left hip: Secondary | ICD-10-CM | POA: Insufficient documentation

## 2015-05-20 NOTE — Progress Notes (Signed)
[name] [place], [place] [address] Phone: [phone] Subjective:    CC: Left hip pain follow up  [id number] Douglas Christensen Douglas Christensen is a 64 y.o. male coming in with complaint of left hip pain. Patient was seen initially and urgent care and patient did have x-rays which were fairly unremarkable. Patient then supplement care provider on April 4 and was given an injection for a greater trochanteric bursitis. 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 Patient continued to have pain even with using the tramadol, and gabapentin for the potential for lumbar radiculopathy patient has had no total of 2 facet injections as well as an epidural sterile injection with minimal relief of the back. Patient was seen by neurosurgery who gave him a 50-50 chance of having improvement with surgery. Patient is here for further evaluation if this could be more of the hip pain.     Past Medical History  Diagnosis Date  . History of colonic polyps   . COPD (chronic obstructive pulmonary disease)   . CAD (coronary artery disease)     Mild plaque (cath "years ago"); abnormal Myoview 04/2013 with subsequent CABG x 5 with LIMA to LAD, SVG to OM1, SVG to DX, SVG to PD & PL.   Marland Kitchen GERD (gastroesophageal reflux disease)   . Hyperlipidemia   . Hypertension   . Vitamin D deficiency   . Meralgia paresthetica of left side 2011  . LBP (low back pain)   . Hx of echocardiogram     Echo (9/15):  EF 55-60%; Gr 2 DD, mild BAE  . Anxiety   . PVD (peripheral vascular disease)     Stent to left common femoral and right superficial femoral.  2008.  50%  left renal   . 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 Shortness of breath     "once in awhile; can happen at anytime" (08/26/2013)   Past Surgical History  Procedure Laterality Date  . Lower extremity stents      bilateral lower  extremities x 2  . Femoral-popliteal bypass graft  12/27/2012    Procedure: BYPASS GRAFT FEMORAL-POPLITEAL ARTERY;  Surgeon: Serafina Mitchell, MD;  Location: [facility];  Service: Vascular;  Laterality: Right;  using non-reversed sapphenous vein.  . Cardiac catheterization  05/02/13    x2   . Coronary artery bypass graft N/A 05/06/2013    Procedure: CORONARY ARTERY BYPASS GRAFTING (CABG);  Surgeon: Melrose Nakayama, MD;  Location: [facility];  Service: Open Heart Surgery;  Laterality: N/A;  Coronary artery bypass graft times five using left internal mammary artery and left greater saphenous vein via endovein harvest.  . Lower extremity angiogram Bilateral 11/16/2011    Procedure: LOWER EXTREMITY ANGIOGRAM;  Surgeon: Sherren Mocha, MD;  Location: [facility] CATH LAB;  Service: Cardiovascular;  Laterality: Bilateral;  . Abdominal aortagram N/A 11/16/2011    Procedure: ABDOMINAL Maxcine Ham;  Surgeon: Sherren Mocha, MD;  Location: [facility] CATH LAB;  Service: Cardiovascular;  Laterality: N/A;  . Percutaneous stent intervention Left 11/16/2011    Procedure: PERCUTANEOUS STENT INTERVENTION;  Surgeon: Sherren Mocha, MD;  Location: [facility] CATH LAB;  Service: Cardiovascular;  Laterality: Left;  . Abdominal aortagram N/A 11/14/2012    Procedure: ABDOMINAL Maxcine Ham;  Surgeon: Sherren Mocha, MD;  Location: [facility] CATH LAB;  Service: Cardiovascular;  Laterality: N/A;  . Abdominal aortagram N/A 12/30/2014  Procedure: ABDOMINAL AORTAGRAM;  Surgeon: Serafina Mitchell, MD;  Location: [facility] CATH LAB;  Service: Cardiovascular;  Laterality: N/A;  . Endarterectomy femoral Left 01/29/2015    Procedure: ENDARTERECTOMY FEMORAL WITH PATCH ANGIOPLASTY;  Surgeon: Serafina Mitchell, MD;  Location: [facility];  Service: Vascular;  Laterality: Left;  . Iliac atherectomy Left 01/29/2015    Procedure: SUPERFICIAL FEMORAL ARTERY ATHERECTOMY/PERCUTANEOUS TRANSLUMINAL ANGIOPLASTY; superficial femoral artery stent;  Surgeon: Serafina Mitchell, MD;  Location: [facility];  Service:  Vascular;  Laterality: Left;   History   Social History  . Marital Status: Widowed    Spouse Name: N/A  . Number of Children: 2  . Years of Education: N/A   Occupational History  . [employer]   Social History Main Topics  . Smoking status: Former Smoker -- 2.00 packs/day for 47 years    Types: Cigarettes    Quit date: 12/17/2012  . Smokeless tobacco: Never Used  . Alcohol Use: 2.4 oz/week    4 Shots of liquor per week     Comment: 08/26/2013 "3-4 mixed drinks/wk"  . Drug Use: No  . Sexual Activity: Yes   Other Topics Concern  . Not on file   Social History Narrative   Widower   Family History  Problem Relation Age of Onset  . Heart disease Mother     No clear CAD and Heart Disease before age 24  . Hypertension Mother   . Cancer Mother     ? colon ca  . Hyperlipidemia Mother   . Cancer Father     brain tumor  . Alzheimer's disease Father   . Colon cancer Neg Hx   . Heart attack Neg Hx    Allergies  Allergen Reactions  . Roxicodone [Oxycodone Hcl] Other (See Comments)    hallucinations  . Benazepril Cough  . Itraconazole Nausea Only and Rash    Past medical history, social, surgical and family history all reviewed in electronic medical record.   Review of Systems: No headache, visual changes, nausea, vomiting, diarrhea, constipation, dizziness, abdominal pain, skin rash, fevers, chills, night sweats, weight loss, swollen lymph nodes, body aches, joint swelling, muscle aches, chest pain, shortness of breath, mood changes.   Objective There were no vitals taken for this visit.  General: No apparent distress alert and oriented x3 mood and affect normal, dressed appropriately.  HEENT: Pupils equal, extraocular movements intact  Respiratory: Patient's speak in full sentences and does not appear short of breath  Cardiovascular: No lower extremity edema, non tender, no erythema  Skin: Warm dry intact with no signs of infection or rash on extremities  or on axial skeleton.  Abdomen: Soft nontender  Neuro: Cranial nerves II through XII are intact, neurovascularly intact in all extremities with 2+ DTRs and 2+ pulses.  Lymph: No lymphadenopathy of posterior or anterior cervical chain or axillae bilaterally.  Gait severe antalgic gait with external rotation of the leg..  MSK:  Non tender with full range of motion and good stability and symmetric strength and tone of shoulders, elbows, wrist, knee and ankles bilaterally.  Hip: left ROM IR: 25 Deg with increasing pain from previous exam, ER: 45 Deg, Flexion: 110 Deg, Extension: 100 Deg, Abduction: 35 Deg, Adduction: 25 Deg Strength IR: 4/5, ER: 5/5, Flexion: 4/5, Extension: 5/5, Abduction: 3+/5, Adduction: 5/5 compared to full strength on the contralateral side Pelvic alignment unremarkable to inspection and palpation. Standing hip rotation and gait without trendelenburg sign / unsteadiness. Greater trochanter without tenderness to  palpation. His have tenderness over the piriformis and mild diffuse tenderness over the posterior lateral hip but no specific finding. Knee pain with Corky Sox and internal rotation No SI joint tenderness and normal minimal SI movement. Straight leg test positive left still present   Procedure: Real-time Ultrasound Guided Injection of left intra-articular hip Device: GE Logiq E  Ultrasound guided injection is preferred based studies that show increased duration, increased effect, greater accuracy, decreased procedural pain, increased response rate with ultrasound guided versus blind injection.  Verbal informed consent obtained.  Time-out conducted.  Noted no overlying erythema, induration, or other signs of local infection.  Skin prepped in a sterile fashion.  Local anesthesia: Topical Ethyl chloride.  With sterile technique and under real time ultrasound guidance:  Anterior capsule visualized, needle visualized going to the head neck junction at the anterior capsule.  Pictures taken. Patient did have injection of 3 cc of 1% lidocaine, 3 cc of 0.5% Marcaine, and 1 cc of Kenalog 40 mg/dL. Completed without difficulty  Pain immediately resolved suggesting accurate placement of the medication.  Advised to call if fevers/chills, erythema, induration, drainage, or persistent bleeding.  Images permanently stored and available for review in the ultrasound unit.  Impression: Technically successful ultrasound guided injection.      Impression and Recommendations:     This case required medical decision making of moderate complexity.

## 2015-05-20 NOTE — Patient Instructions (Signed)
Good to see you Tried an injection today Ice in 6 hours Continue the vitamins and your meds We can repeat every 12 weeks if needed

## 2015-05-20 NOTE — Progress Notes (Signed)
Pre visit review using our clinic review tool, if applicable. No additional management support is needed unless otherwise documented below in the visit note. 

## 2015-05-20 NOTE — Assessment & Plan Note (Signed)
Discussed with patient at great length. 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 Patient did respond very well to the injection today which makes me hopeful that most of his pain will be improved and patient will be able to ambulate more effectively with less pain. We discussed that this will be temporary measure. We discussed that we can repeat this safely every 10-12 weeks if necessary. Does not seem to last that long we may need to consider hip replacement surgery with the amount of pain that he is again. Still would not be likely perfection. Patient will come back and see me again in 3-4 weeks.

## 2015-05-22 ENCOUNTER — Telehealth: Payer: Self-pay | Admitting: Family Medicine

## 2015-05-22 DIAGNOSIS — M1612 Unilateral primary osteoarthritis, left hip: Secondary | ICD-10-CM

## 2015-05-22 NOTE — Telephone Encounter (Signed)
Patient called to let you know the injection worked for a couple days and now its back to the way it was. Wants to know what to do next

## 2015-05-25 NOTE — Telephone Encounter (Signed)
Spoke to pt, he states that he is more interested in having a hip replacement since Dr. Arnoldo Morale does not seem to think it's coming from his back.

## 2015-05-25 NOTE — Telephone Encounter (Signed)
Will referral to [facility] for potential hip replacement after with patient's responding to an injection in the hip.

## 2015-05-25 NOTE — Telephone Encounter (Signed)
Referral entered  

## 2015-06-04 ENCOUNTER — Other Ambulatory Visit: Payer: Self-pay | Admitting: *Deleted

## 2015-06-04 DIAGNOSIS — G8918 Other acute postprocedural pain: Secondary | ICD-10-CM

## 2015-06-04 MED ORDER — TRAMADOL HCL 50 MG PO TABS: 100.0000 mg | ORAL_TABLET | Freq: Three times a day (TID) | ORAL | Status: DC | PRN

## 2015-06-04 NOTE — Telephone Encounter (Signed)
Refill done.  

## 2015-06-09 ENCOUNTER — Telehealth: Payer: Self-pay | Admitting: Family Medicine

## 2015-06-09 NOTE — Telephone Encounter (Signed)
Please call patient to go over what the orthopedic surgeon said.

## 2015-06-09 NOTE — Telephone Encounter (Signed)
Spoke to pt, he saw Dr. Mayer Camel again & he is scheduled for hip replacement surgery for Aug 1.

## 2015-06-10 ENCOUNTER — Other Ambulatory Visit: Payer: Self-pay | Admitting: Orthopedic Surgery

## 2015-06-10 ENCOUNTER — Telehealth: Payer: Self-pay | Admitting: Pulmonary Disease

## 2015-06-10 DIAGNOSIS — G4733 Obstructive sleep apnea (adult) (pediatric): Secondary | ICD-10-CM

## 2015-06-10 NOTE — Telephone Encounter (Signed)
Called and spoke to pt. Pt is requesting to changed from [place] to [place] d/t personal issues with [employer] and the location convenience of [place] [facility]. Pt requesting a new mask and filter for machine.   Dr. Halford Chessman please advise.

## 2015-06-10 NOTE — Telephone Encounter (Signed)
Okay to send these orders. 

## 2015-06-11 NOTE — Telephone Encounter (Signed)
lmtcb x1 for pt. 

## 2015-06-12 ENCOUNTER — Telehealth: Payer: Self-pay | Admitting: Cardiovascular Disease

## 2015-06-12 NOTE — Telephone Encounter (Signed)
New message     Request for surgical clearance:  1. What type of surgery is being performed? Total left hip replacement  2. When is this surgery scheduled? August 1st  3. Are there any medications that need to be held prior to surgery and how long? Plavix 1 week prior to surgery   4. Name of physician performing surgery? Dr Frederik Pear  5. What is your office phone and fax number? [facility] [phone] Fax [phone]  Please call to discuss

## 2015-06-12 NOTE — Telephone Encounter (Signed)
lmtcb x2 for pt. 

## 2015-06-12 NOTE — Telephone Encounter (Signed)
Cardiac Clearance call received from Dr. Shaune Spittle office for pt having Left Total Hip replacement on 8/1.  Pt last seen in our office by Kathleen Argue on 07/2014. Pt seen by Dr. Trula Slade for vascular surgery and was started on Plavix at that time. Pt contacted and told will need cardiac clearance for his 8/1 procedure but we will not be advising on the Plavix as it was not ordered by our provider.  Pt provided appointment on 8/27 2 12:10pm with Kathleen Argue. Pt understands that I will call him back if Dr. Damita Dunnings office does not need Cardiac Clearance but if he does not hear back from me that he expected to be here on 7/27.  Pt expressed understanding no additional questions at this time.  Contacted Dr. Shaune Spittle office spoke with Myriam Jacobson, she verified that they do need cardiac clearance.  Also explained to her that the pt will have an appointment in our office for clearance and has a scheduled appointment in Dr. Stephens Shire office on 7/25, which will advise on his Plavix.  She expressed understanding.

## 2015-06-12 NOTE — Telephone Encounter (Signed)
Spoke w/ pt and is aware order placed. Nothing further needed

## 2015-06-17 ENCOUNTER — Telehealth: Payer: Self-pay

## 2015-06-17 NOTE — Telephone Encounter (Signed)
Will fax note to [facility], Attn.: [place] Miners, re: approval to hold Plavix 1 wk. Pre-operatively.

## 2015-06-17 NOTE — Telephone Encounter (Signed)
-----   Message from Serafina Mitchell, MD sent at 06/16/2015  9:37 PM EDT ----- Regarding: RE: clearance to hold Plavix Ok to hold ----- Message -----    From: Denman George, RN    Sent: 06/16/2015  10:22 AM      To: Serafina Mitchell, MD Subject: clearance to hold Plavix                       rec'd request from [facility] for authorization for pt. to hold Plavix 1 week prior to Left Total Hip Arthroplasty, which is sched. 06/29/15.  He is scheduled for a 3 mo. F/u with you on 06/22/15.  Are you able to approve for him to hold plavix x 1 wk prior to surgery, or do you want to wait and see him on 06/22/15 first?

## 2015-06-18 ENCOUNTER — Encounter: Payer: Self-pay | Admitting: Surgery

## 2015-06-22 ENCOUNTER — Ambulatory Visit (HOSPITAL_COMMUNITY)
Admission: RE | Admit: 2015-06-22 | Discharge: 2015-06-22 | Disposition: A | Discharge status: Home / Self Care | Payer: BLUE CROSS/BLUE SHIELD | Source: Ambulatory Visit | Attending: Surgery | Admitting: Surgery

## 2015-06-22 ENCOUNTER — Other Ambulatory Visit: Payer: Self-pay

## 2015-06-22 ENCOUNTER — Emergency Department (HOSPITAL_COMMUNITY)
Admission: EM | Admit: 2015-06-22 | Discharge: 2015-06-22 | Disposition: A | Discharge status: Home / Self Care | Payer: BLUE CROSS/BLUE SHIELD | Attending: Emergency Medicine | Admitting: Emergency Medicine

## 2015-06-22 ENCOUNTER — Encounter: Payer: Self-pay | Admitting: Surgery

## 2015-06-22 ENCOUNTER — Encounter (HOSPITAL_COMMUNITY)
Admission: RE | Admit: 2015-06-22 | Discharge: 2015-06-22 | Disposition: A | Discharge status: Home / Self Care | Payer: BLUE CROSS/BLUE SHIELD | Source: Ambulatory Visit | Attending: Orthopedic Surgery | Admitting: Orthopedic Surgery

## 2015-06-22 ENCOUNTER — Encounter (HOSPITAL_COMMUNITY): Payer: Self-pay | Admitting: Family Medicine

## 2015-06-22 ENCOUNTER — Ambulatory Visit (INDEPENDENT_AMBULATORY_CARE_PROVIDER_SITE_OTHER): Payer: BLUE CROSS/BLUE SHIELD | Admitting: Surgery

## 2015-06-22 ENCOUNTER — Encounter (HOSPITAL_COMMUNITY): Payer: Self-pay | Admitting: *Deleted

## 2015-06-22 VITALS — BP 126/70 | HR 62 | Temp 97.8°F | Resp 18 | Ht 65.0 in | Wt 183.4 lb

## 2015-06-22 DIAGNOSIS — Z79899 Other long term (current) drug therapy: Secondary | ICD-10-CM | POA: Diagnosis not present

## 2015-06-22 DIAGNOSIS — M25552 Pain in left hip: Secondary | ICD-10-CM | POA: Insufficient documentation

## 2015-06-22 DIAGNOSIS — Z7982 Long term (current) use of aspirin: Secondary | ICD-10-CM | POA: Insufficient documentation

## 2015-06-22 DIAGNOSIS — I2510 Atherosclerotic heart disease of native coronary artery without angina pectoris: Secondary | ICD-10-CM | POA: Insufficient documentation

## 2015-06-22 DIAGNOSIS — S39012A Strain of muscle, fascia and tendon of lower back, initial encounter: Secondary | ICD-10-CM | POA: Diagnosis not present

## 2015-06-22 DIAGNOSIS — M545 Low back pain, unspecified: Secondary | ICD-10-CM

## 2015-06-22 DIAGNOSIS — K219 Gastro-esophageal reflux disease without esophagitis: Secondary | ICD-10-CM | POA: Diagnosis not present

## 2015-06-22 DIAGNOSIS — I10 Essential (primary) hypertension: Secondary | ICD-10-CM | POA: Insufficient documentation

## 2015-06-22 DIAGNOSIS — Z48812 Encounter for surgical aftercare following surgery on the circulatory system: Secondary | ICD-10-CM | POA: Diagnosis not present

## 2015-06-22 DIAGNOSIS — E785 Hyperlipidemia, unspecified: Secondary | ICD-10-CM | POA: Insufficient documentation

## 2015-06-22 DIAGNOSIS — I70219 Atherosclerosis of native arteries of extremities with intermittent claudication, unspecified extremity: Secondary | ICD-10-CM

## 2015-06-22 DIAGNOSIS — I739 Peripheral vascular disease, unspecified: Secondary | ICD-10-CM

## 2015-06-22 DIAGNOSIS — Z9889 Other specified postprocedural states: Secondary | ICD-10-CM | POA: Diagnosis not present

## 2015-06-22 DIAGNOSIS — Z951 Presence of aortocoronary bypass graft: Secondary | ICD-10-CM | POA: Insufficient documentation

## 2015-06-22 DIAGNOSIS — G8929 Other chronic pain: Secondary | ICD-10-CM | POA: Insufficient documentation

## 2015-06-22 DIAGNOSIS — Y9389 Activity, other specified: Secondary | ICD-10-CM | POA: Diagnosis not present

## 2015-06-22 DIAGNOSIS — X58XXXA Exposure to other specified factors, initial encounter: Secondary | ICD-10-CM | POA: Diagnosis not present

## 2015-06-22 DIAGNOSIS — F419 Anxiety disorder, unspecified: Secondary | ICD-10-CM | POA: Diagnosis not present

## 2015-06-22 DIAGNOSIS — Z8669 Personal history of other diseases of the nervous system and sense organs: Secondary | ICD-10-CM | POA: Insufficient documentation

## 2015-06-22 DIAGNOSIS — J449 Chronic obstructive pulmonary disease, unspecified: Secondary | ICD-10-CM | POA: Diagnosis not present

## 2015-06-22 DIAGNOSIS — Z87891 Personal history of nicotine dependence: Secondary | ICD-10-CM | POA: Diagnosis not present

## 2015-06-22 DIAGNOSIS — Z86010 Personal history of colonic polyps: Secondary | ICD-10-CM | POA: Insufficient documentation

## 2015-06-22 DIAGNOSIS — E559 Vitamin D deficiency, unspecified: Secondary | ICD-10-CM | POA: Insufficient documentation

## 2015-06-22 DIAGNOSIS — Y9289 Other specified places as the place of occurrence of the external cause: Secondary | ICD-10-CM | POA: Diagnosis not present

## 2015-06-22 DIAGNOSIS — M1990 Unspecified osteoarthritis, unspecified site: Secondary | ICD-10-CM | POA: Insufficient documentation

## 2015-06-22 DIAGNOSIS — Y998 Other external cause status: Secondary | ICD-10-CM | POA: Diagnosis not present

## 2015-06-22 DIAGNOSIS — S3992XA Unspecified injury of lower back, initial encounter: Secondary | ICD-10-CM | POA: Diagnosis present

## 2015-06-22 LAB — CBC WITH DIFFERENTIAL/PLATELET
BASOS PCT: 0 % (ref 0–1)
Basophils Absolute: 0 10*3/uL (ref 0.0–0.1)
Eosinophils Absolute: 0 10*3/uL (ref 0.0–0.7)
Eosinophils Relative: 0 % (ref 0–5)
HCT: 44.2 % (ref 39.0–52.0)
Hemoglobin: 15.6 g/dL (ref 13.0–17.0)
LYMPHS ABS: 1.5 10*3/uL (ref 0.7–4.0)
LYMPHS PCT: 12 % (ref 12–46)
MCH: 34 pg (ref 26.0–34.0)
MCHC: 35.3 g/dL (ref 30.0–36.0)
MCV: 96.3 fL (ref 78.0–100.0)
Monocytes Absolute: 1.3 10*3/uL — ABNORMAL HIGH (ref 0.1–1.0)
Monocytes Relative: 10 % (ref 3–12)
NEUTROS ABS: 9.7 10*3/uL — AB (ref 1.7–7.7)
Neutrophils Relative %: 78 % — ABNORMAL HIGH (ref 43–77)
Platelets: 205 10*3/uL (ref 150–400)
RBC: 4.59 MIL/uL (ref 4.22–5.81)
RDW: 13.6 % (ref 11.5–15.5)
WBC: 12.6 10*3/uL — ABNORMAL HIGH (ref 4.0–10.5)

## 2015-06-22 LAB — BASIC METABOLIC PANEL
Anion gap: 8 (ref 5–15)
BUN: 14 mg/dL (ref 6–20)
CALCIUM: 9.1 mg/dL (ref 8.9–10.3)
CO2: 25 mmol/L (ref 22–32)
Chloride: 101 mmol/L (ref 101–111)
Creatinine, Ser: 0.97 mg/dL (ref 0.61–1.24)
GFR calc Af Amer: >60 mL/min (ref >60)
GFR calc non Af Amer: >60 mL/min (ref >60)
GLUCOSE: 133 mg/dL — AB (ref 65–99)
Potassium: 3.7 mmol/L (ref 3.5–5.1)
Sodium: 134 mmol/L — ABNORMAL LOW (ref 135–145)

## 2015-06-22 LAB — PROTIME-INR
INR: 1.1 (ref 0.00–1.49)
Prothrombin Time: 14.4 seconds (ref 11.6–15.2)

## 2015-06-22 LAB — URINE MICROSCOPIC-ADD ON

## 2015-06-22 LAB — TYPE AND SCREEN
ABO/RH(D): A NEG
ANTIBODY SCREEN: NEGATIVE

## 2015-06-22 LAB — URINALYSIS, ROUTINE W REFLEX MICROSCOPIC
GLUCOSE, UA: NEGATIVE mg/dL
HGB URINE DIPSTICK: NEGATIVE
Ketones, ur: 15 mg/dL — AB
Nitrite: NEGATIVE
PROTEIN: NEGATIVE mg/dL
Specific Gravity, Urine: 1.019 (ref 1.005–1.030)
Urobilinogen, UA: 1 mg/dL (ref 0.0–1.0)
pH: 6 (ref 5.0–8.0)

## 2015-06-22 LAB — SURGICAL PCR SCREEN
MRSA, PCR: NEGATIVE
Staphylococcus aureus: NEGATIVE

## 2015-06-22 LAB — APTT: APTT: 30 s (ref 24–37)

## 2015-06-22 MED ORDER — DIAZEPAM 5 MG PO TABS: 5.0000 mg | ORAL_TABLET | Freq: Four times a day (QID) | ORAL | Status: DC | PRN

## 2015-06-22 MED ORDER — KETOROLAC TROMETHAMINE 60 MG/2ML IM SOLN
30.0000 mg | Freq: Once | INTRAMUSCULAR | Status: AC
  Administered 2015-06-22: 30 mg via INTRAMUSCULAR
  Filled 2015-06-22: qty 2

## 2015-06-22 MED ORDER — KETOROLAC TROMETHAMINE 30 MG/ML IJ SOLN: 30.0000 mg | Freq: Once | INTRAMUSCULAR | Status: DC

## 2015-06-22 MED ORDER — DIAZEPAM 5 MG PO TABS
5.0000 mg | ORAL_TABLET | Freq: Once | ORAL | Status: AC
  Administered 2015-06-22: 5 mg via ORAL
  Filled 2015-06-22: qty 1

## 2015-06-22 NOTE — ED Notes (Addendum)
Pt here with right lower back pain that started last night about 11 pm. Denies fall, injury. sts he felt the same previously with kidney infection.

## 2015-06-22 NOTE — Pre-Procedure Instructions (Signed)
    Douglas Christensen  06/22/2015      Your procedure is scheduled on Monday, August 1.  Report to [facility] Admitting at 8:00A.M.                Your Surgery is scheduled for 10: 05 AM                    Call this number if you have problems the morning of surgery:[phone]                  For any other questions, please call [phone], Monday - Friday 8 AM - 4 PM.  Remember:  Do not eat food or drink liquids after midnight Sunday, July 31.  Take these medicines the morning of surgery with A SIP OF WATER :amLODipine (NORVASC),  atenolol (TENORMIN), hydrALAZINE (APRESOLINE).                Take as needed the morning of surgery: ALPRAZolam Duanne Moron), ranitidine (ZANTAC), traMADol (ULTRAM).               Stop Plavix and Aspirin as instructed by Dr.                               Stop taking Vitamins, naproxen sodium (ANAPROX).   Do not wear jewelry, make-up or nail polish.  Do not wear lotions, powders, or perfumes.   Men may shave face and neck.  Do not bring valuables to the hospital.  [facility] is not responsible for any belongings or valuables.  Contacts, dentures or bridgework may not be worn into surgery.  Leave your suitcase in the car.  After surgery it may be brought to your room.  For patients admitted to the hospital, discharge time will be determined by your treatment team.  Special instructions:  Review  [place] - Preparing For Surgery.  Please read over the following fact sheets that you were given.: Pain Booklet, Coughing and Deep Breathing, Blood Transfusion Information and Surgical Site Infection Prevention and Incentive Spirometry.

## 2015-06-22 NOTE — Progress Notes (Signed)
Douglas Christensen has a history of CAD- Had CABG in 2014.  Patient also has PAD- had Femoral Artery stent 33/16.  Patient was seen by Dr Trula Slade, this am to  follow  Stent placement.  Dr Trula Slade instructed patient to stop Plavix and Baby Aspirin and to start Aspirin 325 after surgery.  Douglas Christensen has a rash on his face and large ecchymotic areas on his arms from Plavix.  Douglas Christensen reports that he has an appointment with Dr Copper on Wednesday, July 27 for cardiac clearance pre op.  Douglas Christensen denies any chest pain, states his is short of breath with exertation, "I  smoked for 47 years"

## 2015-06-22 NOTE — ED Provider Notes (Signed)
Pt seen and evaluated.  D/W Dr. Joya Gaskins.  Reports Rt lower back pain and spasm since last night.  Pt with Chronic Lt hip pain. Ambulates with a limp.  Exam with +TTP to Right lumbar paraspinal muscle tenderness.  Normal BLE neuro exam.  Agree with Sx treatment.   Tanna Furry, MD 06/22/15 [phone]

## 2015-06-22 NOTE — ED Notes (Addendum)
Pt reports R sided lower back pain. Onset last night at home. 8/10 pain at the time, increases with movement. Pt ambulatory to exam room without difficulty.

## 2015-06-22 NOTE — ED Provider Notes (Signed)
CSN: [id number]     Arrival date & time 06/22/15  0720 History   First MD Initiated Contact with Patient 06/22/15 [phone]     Chief Complaint  Patient presents with  . Back Pain   (Consider location/radiation/quality/duration/timing/severity/associated sxs/prior Treatment) Patient is a 64 y.o. male presenting with back pain. The history is provided by the patient. No language interpreter was used.  Back Pain Location:  Lumbar spine Quality:  Aching Radiates to:  Does not radiate Pain severity:  Moderate Onset quality:  Gradual Timing:  Constant Progression:  Worsening Chronicity:  New Context: not recent illness, not recent injury and not twisting   Relieved by:  Nothing Worsened by:  Ambulation, bending, movement and palpation Ineffective treatments:  None tried Associated symptoms: no abdominal pain, no bladder incontinence, no bowel incontinence, no chest pain, no dysuria, no fever, no headaches, no leg pain, no numbness, no perianal numbness, no tingling and no weakness   Risk factors: no hx of cancer and no recent surgery     Past Medical History  Diagnosis Date  . History of colonic polyps   . COPD (chronic obstructive pulmonary disease)   . CAD (coronary artery disease)     Mild plaque (cath "years ago"); abnormal Myoview 04/2013 with subsequent CABG x 5 with LIMA to LAD, SVG to OM1, SVG to DX, SVG to PD & PL.   Marland Kitchen GERD (gastroesophageal reflux disease)   . Hyperlipidemia   . Hypertension   . Vitamin D deficiency   . Meralgia paresthetica of left side 2011  . LBP (low back pain)   . Hx of echocardiogram     Echo (9/15):  EF 55-60%; Gr 2 DD, mild BAE  . Anxiety   . PVD (peripheral vascular disease)     Stent to left common femoral and right superficial femoral.  2008.  50%  left renal   . 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 Shortness of breath     "once in awhile; can happen at anytime" (08/26/2013)   Past  Surgical History  Procedure Laterality Date  . Lower extremity stents      bilateral lower extremities x 2  . Femoral-popliteal bypass graft  12/27/2012    Procedure: BYPASS GRAFT FEMORAL-POPLITEAL ARTERY;  Surgeon: Serafina Mitchell, MD;  Location: [facility] OR;  Service: Vascular;  Laterality: Right;  using non-reversed sapphenous vein.  . Cardiac catheterization  05/02/13    x2   . Coronary artery bypass graft N/A 05/06/2013    Procedure: CORONARY ARTERY BYPASS GRAFTING (CABG);  Surgeon: Melrose Nakayama, MD;  Location: [place];  Service: Open Heart Surgery;  Laterality: N/A;  Coronary artery bypass graft times five using left internal mammary artery and left greater saphenous vein via endovein harvest.  . Lower extremity angiogram Bilateral 11/16/2011    Procedure: LOWER EXTREMITY ANGIOGRAM;  Surgeon: Sherren Mocha, MD;  Location: [facility] CATH LAB;  Service: Cardiovascular;  Laterality: Bilateral;  . Abdominal aortagram N/A 11/16/2011    Procedure: ABDOMINAL Maxcine Ham;  Surgeon: Sherren Mocha, MD;  Location: [facility] CATH LAB;  Service: Cardiovascular;  Laterality: N/A;  . Percutaneous stent intervention Left 11/16/2011    Procedure: PERCUTANEOUS STENT INTERVENTION;  Surgeon: Sherren Mocha, MD;  Location: [facility] CATH LAB;  Service: Cardiovascular;  Laterality: Left;  . Abdominal aortagram N/A 11/14/2012    Procedure: ABDOMINAL Maxcine Ham;  Surgeon: Sherren Mocha, MD;  Location: [facility] CATH LAB;  Service: Cardiovascular;  Laterality: N/A;  . Abdominal aortagram N/A 12/30/2014    Procedure: ABDOMINAL AORTAGRAM;  Surgeon: Serafina Mitchell, MD;  Location: [facility] CATH LAB;  Service: Cardiovascular;  Laterality: N/A;  . Endarterectomy femoral Left 01/29/2015    Procedure: ENDARTERECTOMY FEMORAL WITH PATCH ANGIOPLASTY;  Surgeon: Serafina Mitchell, MD;  Location: [place];  Service: Vascular;  Laterality: Left;  . Iliac atherectomy Left 01/29/2015    Procedure: SUPERFICIAL FEMORAL ARTERY ATHERECTOMY/PERCUTANEOUS TRANSLUMINAL ANGIOPLASTY;  superficial femoral artery stent;  Surgeon: Serafina Mitchell, MD;  Location: [place] OR;  Service: Vascular;  Laterality: Left;   Family History  Problem Relation Age of Onset  . Heart disease Mother     No clear CAD and Heart Disease before age 66  . Hypertension Mother   . Cancer Mother     ? colon ca  . Hyperlipidemia Mother   . Cancer Father     brain tumor  . Alzheimer's disease Father   . Colon cancer Neg Hx   . Heart attack Neg Hx    History  Substance Use Topics  . Smoking status: Former Smoker -- 2.00 packs/day for 47 years    Types: Cigarettes    Quit date: 12/17/2012  . Smokeless tobacco: Never Used  . Alcohol Use: 2.4 oz/week    4 Shots of liquor per week     Comment: 08/26/2013 "3-4 mixed drinks/wk"    Review of Systems  Constitutional: Negative for fever and fatigue.  Respiratory: Negative for chest tightness and shortness of breath.   Cardiovascular: Negative for chest pain.  Gastrointestinal: Negative for nausea, vomiting, abdominal pain and bowel incontinence.  Genitourinary: Negative for bladder incontinence, dysuria and flank pain.  Musculoskeletal: Positive for back pain and gait problem (2/2 left hip pain). Negative for neck pain.  Skin: Negative for wound.  Neurological: Negative for tingling, weakness, light-headedness, numbness and headaches.  Psychiatric/Behavioral: Negative for confusion.  All other systems reviewed and are negative.     Allergies  Roxicodone; Benazepril; and Itraconazole  Home Medications   Prior to Admission medications   Medication Sig Start Date End Date Taking? Authorizing Provider  ALPRAZolam (XANAX) 0.25 MG tablet Take 1 tablet (0.25 mg total) by mouth 2 (two) times daily as needed for anxiety. 03/02/15   Aleksei Plotnikov V, MD  amLODipine (NORVASC) 10 MG tablet TAKE 1/2 TABLET BY MOUTH EVERY DAY Patient taking differently: TAKE 5 MG BY MOUTH EVERY DAY 03/02/15   Lew Dawes V, MD  aspirin EC 81 MG EC tablet Take 1  tablet (81 mg total) by mouth daily. 01/30/15   Ulyses Amor, PA-C  atenolol (TENORMIN) 50 MG tablet TAKE 1 TABLET BY MOUTH EVERY DAY Patient taking differently: TAKE 50 MG BY MOUTH EVERY DAY 02/13/15   Aleksei Plotnikov V, MD  atorvastatin (LIPITOR) 20 MG tablet TAKE 1 TABLET BY MOUTH EVERY DAY Patient taking differently: TAKE 20 MG BY MOUTH EVERY DAY 12/25/14   Aleksei Plotnikov V, MD  Avanafil (STENDRA) 100 MG TABS Take 100 mg by mouth daily as needed. Patient not taking: Reported on 06/18/2015 03/02/15   Lew Dawes V, MD  cholecalciferol (VITAMIN D) 1000 UNITS tablet Take 1,000 Units by mouth daily.     Historical Provider, MD  clopidogrel (PLAVIX) 75 MG tablet Take 1 tablet (75 mg total) by mouth daily. 01/30/15   Samantha J Rhyne, PA-C  diazepam (VALIUM) 5 MG tablet Take 1 tablet (5 mg total) by mouth every 6 (six) hours as needed for anxiety. 06/22/15  Tori Milks, MD  gabapentin (NEURONTIN) 100 MG capsule Take 1 capsule (100 mg total) by mouth at bedtime. Patient not taking: Reported on 06/18/2015 03/17/15   Lyndal Pulley, DO  hydrALAZINE (APRESOLINE) 50 MG tablet TAKE 1 TABLET BY MOUTH THREE TIMES DAILY Patient taking differently: TAKE 50 MG BY MOUTH THREE TIMES DAILY 03/26/15   Cassandria Anger, MD  hydrOXYzine (ATARAX/VISTARIL) 10 MG tablet Take 1 tablet (10 mg total) by mouth 3 (three) times daily as needed. Patient not taking: Reported on 06/18/2015 01/27/15   Hendricks Limes, MD  naproxen sodium (ANAPROX) 220 MG tablet Take 440 mg by mouth as needed (for pain).     Historical Provider, MD  potassium chloride SA (K-DUR,KLOR-CON) 20 MEQ tablet Take 1 tablet (20 mEq total) by mouth 2 (two) times daily. Patient not taking: Reported on 06/18/2015 03/14/14   [name], NP  predniSONE (DELTASONE) 20 MG tablet Take 2 tablets (40 mg total) by mouth daily with breakfast. Patient not taking: Reported on 06/18/2015 03/17/15   Lyndal Pulley, DO  ranitidine (ZANTAC) 150 MG tablet Take 150  mg by mouth 2 (two) times daily as needed for heartburn.     Historical Provider, MD  traMADol (ULTRAM) 50 MG tablet Take 2 tablets (100 mg total) by mouth 3 (three) times daily as needed. Patient taking differently: Take 50 mg by mouth 3 (three) times daily as needed for moderate pain.  06/04/15   Lyndal Pulley, DO  vitamin B-12 (CYANOCOBALAMIN) 1000 MCG tablet Take 1,000 mcg by mouth daily.    Historical Provider, MD   BP 128/69 mmHg  Pulse 59  Temp(Src) 98.3 F (36.8 C)  Resp 18  SpO2 95% Physical Exam  Constitutional: He is oriented to person, place, and time. He appears well-developed and well-nourished. No distress.  HENT:  Head: Normocephalic and atraumatic.  Nose: Nose normal.  Mouth/Throat: Oropharynx is clear and moist. No oropharyngeal exudate.  Eyes: EOM are normal. Pupils are equal, round, and reactive to light.  Neck: Normal range of motion. Neck supple.  Cardiovascular: Normal rate, regular rhythm, normal heart sounds and intact distal pulses.   No murmur heard. Pulmonary/Chest: Effort normal and breath sounds normal. No respiratory distress. He has no wheezes. He exhibits no tenderness.  Abdominal: Soft. He exhibits no distension. There is no tenderness. There is no guarding.  No CVA tenderness b/l  Musculoskeletal: Normal range of motion. He exhibits tenderness.  Tenderness to palpation of left lateral hip.   Tender to palp of right lateral lower back muscles, just superior to iliac crest on right.  No midline C/T/L spine tenderness, no SI joint tenderness, no right hip tenderness.  No skin changes over the area.  No signs of trauma or rash  Neurological: He is alert and oriented to person, place, and time. No cranial nerve deficit. Coordination normal.  Skin: Skin is warm and dry. He is not diaphoretic. No pallor.  Psychiatric: He has a normal mood and affect. His behavior is normal. Judgment and thought content normal.  Nursing note and vitals reviewed.   ED Course   Procedures (including critical care time) Labs Review Labs Reviewed  URINALYSIS, ROUTINE W REFLEX MICROSCOPIC (NOT AT [facility]) - Abnormal; Notable for the following:    Bilirubin Urine SMALL (*)    Ketones, ur 15 (*)    Leukocytes, UA SMALL (*)    All other components within normal limits  URINE MICROSCOPIC-ADD ON - Abnormal; Notable for the following:  Casts HYALINE CASTS (*)    All other components within normal limits    Imaging Review No results found.   EKG Interpretation None      MDM   Final diagnoses:  Lumbar strain, initial encounter  Right-sided low back pain without sciatica   Pt is a 64 yo M with hx of chronic back and left hip pain, CAD s/p CABG, HTN, and PAD with several bypasses and stents who presents complaining of right lateral low back pain.  Has chronic left hip pain and is scheduled for a total hip replacement next week. Here today complaining of right lateral back pain since last night.  Gradual onset.  No trauma, heavy lifting, or twisting when the pain started.  Has been walking with a significant limp due to his left hip pain and may have compensated for his left hip, causing the right back pain.   No infectious sx including no fever, chills, N/V, myalgias.   Patient is tender at his right lateral back muscles superior to his right iliac crest.  Nontender at midline C/T/L spine.  No right SI joint tenderness.   No skin findings at this time, but can not rule out zoster in this patient with unilateral pain in an isolated area on his back.    Advised to f/u if he develops rash.   No CVA tenderness and denies urinary sx.  Likely lumbar muscle strain.  As he has been ambulating abnormally due to his chronic left hip pain, he could have easily overcompensated and cause right lateral back pain.   Given IM toradol and PO valium.   Has a PCP appointment in a few minutes for pre-op for his left hip replacement.  Advised to discuss with PCP if his sx continue.   Encouraged to take tylenol as needed, to stretch, and to use a heating pad if needed.  All questions were answered and he was discharged in good condition.    Patient was seen with ED Attending, Dr. Abundio Miu, MD    Tori Milks, MD 06/22/15 [id number]  Tanna Furry, MD 06/27/15 [phone]

## 2015-06-22 NOTE — Progress Notes (Signed)
Patient name: Douglas Christensen MRN: [id number] DOB: June 24, 1951 Sex: male     Chief Complaint  Patient presents with  . PVD    Patient here for followup but he is to have Left total hip on 06-29-15 by Dr. Mayer Camel.  Needs vascular clearance     HISTORY OF PRESENT ILLNESS: The patient is back today for follow-up. On 01/29/2015, he underwent femoral endarterectomy with vein patch angioplasty, followed by atherectomy and stenting of his left superficial femoral artery. This was done for claudication. Overlapping 7 mm Cordis stents were placed. The patient did well postoperatively and was discharged to home.He has a history of right femoral to above-knee popliteal artery bypass graft with vein which was performed on 12/10/2012.  He is scheduled for left hip replacement on August 1.  Past Medical History  Diagnosis Date  . History of colonic polyps   . COPD (chronic obstructive pulmonary disease)   . CAD (coronary artery disease)     Mild plaque (cath "years ago"); abnormal Myoview 04/2013 with subsequent CABG x 5 with LIMA to LAD, SVG to OM1, SVG to DX, SVG to PD & PL.   Marland Kitchen GERD (gastroesophageal reflux disease)   . Hyperlipidemia   . Hypertension   . Vitamin D deficiency   . Meralgia paresthetica of left side 2011  . LBP (low back pain)   . Hx of echocardiogram     Echo (9/15):  EF 55-60%; Gr 2 DD, mild BAE  . Anxiety   . PVD (peripheral vascular disease)     Stent to left common femoral and right superficial femoral.  2008.  50%  left renal   . 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 Shortness of breath     "once in awhile; can happen at anytime" (08/26/2013)    Past Surgical History  Procedure Laterality Date  . Lower extremity stents      bilateral lower extremities x 2  . Femoral-popliteal bypass graft  12/27/2012    Procedure: BYPASS GRAFT FEMORAL-POPLITEAL ARTERY;  Surgeon: Serafina Mitchell, MD;  [facility]: [facility] OR;  Service:  Vascular;  Laterality: Right;  using non-reversed sapphenous vein.  . Cardiac catheterization  05/02/13    x2   . Coronary artery bypass graft N/A 05/06/2013    Procedure: CORONARY ARTERY BYPASS GRAFTING (CABG);  Surgeon: Melrose Nakayama, MD;  Location: [place];  Service: Open Heart Surgery;  Laterality: N/A;  Coronary artery bypass graft times five using left internal mammary artery and left greater saphenous vein via endovein harvest.  . Lower extremity angiogram Bilateral 11/16/2011    Procedure: LOWER EXTREMITY ANGIOGRAM;  Surgeon: Sherren Mocha, MD;  Location: [facility] CATH LAB;  Service: Cardiovascular;  Laterality: Bilateral;  . Abdominal aortagram N/A 11/16/2011    Procedure: ABDOMINAL Maxcine Ham;  Surgeon: Sherren Mocha, MD;  Location: [facility] CATH LAB;  Service: Cardiovascular;  Laterality: N/A;  . Percutaneous stent intervention Left 11/16/2011    Procedure: PERCUTANEOUS STENT INTERVENTION;  Surgeon: Sherren Mocha, MD;  Location: [facility] CATH LAB;  Service: Cardiovascular;  Laterality: Left;  . Abdominal aortagram N/A 11/14/2012    Procedure: ABDOMINAL Maxcine Ham;  Surgeon: Sherren Mocha, MD;  Location: [facility] CATH LAB;  Service: Cardiovascular;  Laterality: N/A;  . Abdominal aortagram N/A 12/30/2014    Procedure: ABDOMINAL AORTAGRAM;  Surgeon: Serafina Mitchell, MD;  Location: [facility] CATH LAB;  Service: Cardiovascular;  Laterality: N/A;  . Endarterectomy femoral Left  01/29/2015    Procedure: ENDARTERECTOMY FEMORAL WITH PATCH ANGIOPLASTY;  Surgeon: Serafina Mitchell, MD;  Location: [place];  Service: Vascular;  Laterality: Left;  . Iliac atherectomy Left 01/29/2015    Procedure: SUPERFICIAL FEMORAL ARTERY ATHERECTOMY/PERCUTANEOUS TRANSLUMINAL ANGIOPLASTY; superficial femoral artery stent;  Surgeon: Serafina Mitchell, MD;  Location: [place];  Service: Vascular;  Laterality: Left;    History   Social History  . Marital Status: Widowed    Spouse Name: N/A  . Number of Children: 2  . Years of Education: N/A    Occupational History  . [employer]   Social History Main Topics  . Smoking status: Former Smoker -- 2.00 packs/day for 47 years    Types: Cigarettes    Quit date: 12/17/2012  . Smokeless tobacco: Never Used  . Alcohol Use: 2.4 oz/week    4 Shots of liquor per week     Comment: 08/26/2013 "3-4 mixed drinks/wk"  . Drug Use: No  . Sexual Activity: Yes   Other Topics Concern  . Not on file   Social History Narrative   Widower    Family History  Problem Relation Age of Onset  . Heart disease Mother     No clear CAD and Heart Disease before age 26  . Hypertension Mother   . Cancer Mother     ? colon ca  . Hyperlipidemia Mother   . Cancer Father     brain tumor  . Alzheimer's disease Father   . Colon cancer Neg Hx   . Heart attack Neg Hx     Allergies as of 06/22/2015 - Review Complete 06/22/2015  Allergen Reaction Noted  . Roxicodone [oxycodone hcl] Other (See Comments) 02/05/2015  . Benazepril Cough 08/31/2012  . Itraconazole Nausea Only and Rash 01/23/2015    Current Outpatient Prescriptions on File Prior to Visit  Medication Sig Dispense Refill  . ALPRAZolam (XANAX) 0.25 MG tablet Take 1 tablet (0.25 mg total) by mouth 2 (two) times daily as needed for anxiety. 60 tablet 5  . amLODipine (NORVASC) 10 MG tablet TAKE 1/2 TABLET BY MOUTH EVERY DAY (Patient taking differently: TAKE 5 MG BY MOUTH EVERY DAY) 90 tablet 0  . aspirin EC 81 MG EC tablet Take 1 tablet (81 mg total) by mouth daily. 30 tablet 0  . atenolol (TENORMIN) 50 MG tablet TAKE 1 TABLET BY MOUTH EVERY DAY (Patient taking differently: TAKE 50 MG BY MOUTH EVERY DAY) 90 tablet 2  . atorvastatin (LIPITOR) 20 MG tablet TAKE 1 TABLET BY MOUTH EVERY DAY (Patient taking differently: TAKE 20 MG BY MOUTH EVERY DAY) 90 tablet 3  . Avanafil (STENDRA) 100 MG TABS Take 100 mg by mouth daily as needed. 30 tablet 5  . cholecalciferol (VITAMIN D) 1000 UNITS tablet Take 1,000 Units by mouth daily.     .  clopidogrel (PLAVIX) 75 MG tablet Take 1 tablet (75 mg total) by mouth daily. 30 tablet 11  . gabapentin (NEURONTIN) 100 MG capsule Take 1 capsule (100 mg total) by mouth at bedtime. 30 capsule 3  . hydrALAZINE (APRESOLINE) 50 MG tablet TAKE 1 TABLET BY MOUTH THREE TIMES DAILY (Patient taking differently: TAKE 50 MG BY MOUTH THREE TIMES DAILY) 270 tablet 2  . hydrOXYzine (ATARAX/VISTARIL) 10 MG tablet Take 1 tablet (10 mg total) by mouth 3 (three) times daily as needed. 30 tablet 0  . naproxen sodium (ANAPROX) 220 MG tablet Take 440 mg by mouth as needed (for pain).     Marland Kitchen  potassium chloride SA (K-DUR,KLOR-CON) 20 MEQ tablet Take 1 tablet (20 mEq total) by mouth 2 (two) times daily. 90 tablet 3  . predniSONE (DELTASONE) 20 MG tablet Take 2 tablets (40 mg total) by mouth daily with breakfast. 10 tablet 0  . ranitidine (ZANTAC) 150 MG tablet Take 150 mg by mouth 2 (two) times daily as needed for heartburn.     . traMADol (ULTRAM) 50 MG tablet Take 2 tablets (100 mg total) by mouth 3 (three) times daily as needed. (Patient taking differently: Take 50 mg by mouth 3 (three) times daily as needed for moderate pain. ) 180 tablet 0  . vitamin B-12 (CYANOCOBALAMIN) 1000 MCG tablet Take 1,000 mcg by mouth daily.     No current facility-administered medications on file prior to visit.     REVIEW OF SYSTEMS: Cardiovascular: No chest pain, chest pressure, palpitations, orthopnea, or dyspnea on exertion. No claudication or rest pain,  No history of DVT or phlebitis. Pulmonary: No productive cough, asthma or wheezing. Neurologic: No weakness, paresthesias, aphasia, or amaurosis. No dizziness. Hematologic: No bleeding problems or clotting disorders. Musculoskeletal:  Left hip pain Gastrointestinal: No blood in stool or hematemesis Genitourinary: No dysuria or hematuria. Psychiatric:: No history of major depression. Integumentary: No rashes or ulcers. Constitutional: No fever or chills.  PHYSICAL  EXAMINATION:   Vital signs are  Filed Vitals:   06/22/15 1230  BP: 126/70  Pulse: 62  Temp: 97.8 F (36.6 C)  TempSrc: Oral  Resp: 18  Height: 5\' 5"  (1.651 m)  Weight: 183 lb 6.4 oz (83.19 kg)  SpO2: 97%   Body mass index is 30.52 kg/(m^2). General: The patient appears their stated age. HEENT:  No gross abnormalities Pulmonary:  Non labored breathing Musculoskeletal: There are no major deformities. Neurologic: No focal weakness or paresthesias are detected, Skin: There are no ulcer or rashes noted. Psychiatric: The patient has normal affect. Cardiovascular:  Palpable pedal pulses.  No edema  Diagnostic Studies  I have ordered and reviewed his vascular lab studies today. ABIs are 1 bilaterally with triphasic waveforms.  No evidence of stenosis is identified within the right leg bypass in the left lower extremity stents.  Assessment:  status post bilateral lower extremity revascularization for claudication Plan:  the patient has had Pearline Cables results from his bilateral revascularization.  His biggest complaint now is that of his left hip which she is scheduled for replacement on August 1. From a vascular perspective, he is cleared to proceed with surgery.  I am okay with stopping his Plavix before his operation.  In fact it sounds like the patient has had some difficulties with Plavix both from bruising as well as breaking out on his face.  For that reason I have recommended that once he has completed his hip surgery that he start a full dose aspirin and discontinue the baby aspirin and Plavix.  I have scheduled for return follow-up in  6 months with repeat duplex and ABIs  V. Leia Alf, M.D. [facility] Office: [phone] Pager:  [phone]

## 2015-06-22 NOTE — Addendum Note (Signed)
Addended by: Dorthula Rue L on: 06/22/2015 03:01 PM   Modules accepted: Orders

## 2015-06-23 ENCOUNTER — Ambulatory Visit: Payer: BLUE CROSS/BLUE SHIELD

## 2015-06-23 NOTE — Progress Notes (Signed)
Cardiology Office Note   Date:  06/24/2015   ID:  Douglas Christensen, DOB 26-Dec-1950, MRN [id number]  PCP:  [name], MD  Cardiologist:  Dr. Sherren Mocha   Electrophysiologist:  n/a  Chief Complaint  Patient presents with  . Surgical Clearance     History of Present Illness: Douglas Christensen is a 64 y.o. male with a hx of CAD s/p CABG in 04/2013, PAD s/p R SFA stent and L CIA stent in 2008 and L EIA stent 2012, HTN, HL, OSA, GERD, prior ETOH abuse. Last seen in this office by me in 07/2014.  Admitted 3/16 for left external iliac, common femoral, profunda femoral and superficial femoral endarterectomy as well as left superficial femoral artery are atherectomy and stent with Dr. Trula Slade.  Patient is scheduled to undergo left total hip replacement 06/29/15 with Dr. Mayer Camel. He returns for surgical clearance.  He is here alone.  He is a widower.  He denies any chest pain, syncope, orthopnea, PND, edema.  He has chronic DOE related to smoking. This is stable without change. He has not smoked in 4 years.  He is fairly limited by his L hip.  He may be able to achieve 4 METs.    Studies: - LHC (6/14): prox and mid LM 40-50%, prox LAD 70%, ostial Dx 75%, mid LAD 90-95%, prox CFX 60-70%, mid RCA occluded, EF 55-65% >>> CABG - Carotid US (1/14): Bilateral ICA 1-39%   Studies/Reports Reviewed Today:  Echo 07/2014 EF 55-60%, Gr 2 DD Mild BAE  LHC (6/14):  prox and mid LM 40-50%, prox LAD 70%, ostial Dx 75%, mid LAD 90-95%, prox CFX 60-70%, mid RCA occluded, EF 55-65% >>> CABG  Carotid US (1/14):  Bilateral ICA 1-39%   Past Medical History  Diagnosis Date  . History of colonic polyps   . COPD (chronic obstructive pulmonary disease)   . CAD (coronary artery disease)     Mild plaque (cath "years ago"); abnormal Myoview 04/2013 with subsequent CABG x 5 with LIMA to LAD, SVG to OM1, SVG to DX, SVG to PD & PL.   Marland Kitchen GERD (gastroesophageal reflux disease)   . Hyperlipidemia   . Hypertension    . Vitamin D deficiency   . Meralgia paresthetica of left side 2011  . LBP (low back pain)   . Hx of echocardiogram     Echo (9/15):  EF 55-60%; Gr 2 DD, mild BAE  . Anxiety   . PVD (peripheral vascular disease)     Stent to left common femoral and right superficial femoral.  2008.  50%  left renal   . 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 Shortness of breath     "once in awhile; can happen at anytime" (08/26/2013)  . Arthritis     Past Surgical History  Procedure Laterality Date  . Lower extremity stents      bilateral lower extremities x 2  . Femoral-popliteal bypass graft  12/27/2012    Procedure: BYPASS GRAFT FEMORAL-POPLITEAL ARTERY;  Surgeon: Serafina Mitchell, MD;  Location: [facility] OR;  Service: Vascular;  Laterality: Right;  using non-reversed sapphenous vein.  . Cardiac catheterization  05/02/13    x2   . Coronary artery bypass graft N/A 05/06/2013    Procedure: CORONARY ARTERY BYPASS GRAFTING (CABG);  Surgeon: Melrose Nakayama, MD;  Location: [place];  Service: Open Heart Surgery;  Laterality: N/A;  Coronary artery bypass  graft times five using left internal mammary artery and left greater saphenous vein via endovein harvest.  . Lower extremity angiogram Bilateral 11/16/2011    Procedure: LOWER EXTREMITY ANGIOGRAM;  Surgeon: Sherren Mocha, MD;  Location: [facility] CATH LAB;  Service: Cardiovascular;  Laterality: Bilateral;  . Abdominal aortagram N/A 11/16/2011    Procedure: ABDOMINAL Maxcine Ham;  Surgeon: Sherren Mocha, MD;  Location: [facility] CATH LAB;  Service: Cardiovascular;  Laterality: N/A;  . Percutaneous stent intervention Left 11/16/2011    Procedure: PERCUTANEOUS STENT INTERVENTION;  Surgeon: Sherren Mocha, MD;  Location: [facility] CATH LAB;  Service: Cardiovascular;  Laterality: Left;  . Abdominal aortagram N/A 11/14/2012    Procedure: ABDOMINAL Maxcine Ham;  Surgeon: Sherren Mocha, MD;  Location: [facility] CATH LAB;  Service:  Cardiovascular;  Laterality: N/A;  . Abdominal aortagram N/A 12/30/2014    Procedure: ABDOMINAL AORTAGRAM;  Surgeon: Serafina Mitchell, MD;  Location: [facility] CATH LAB;  Service: Cardiovascular;  Laterality: N/A;  . Endarterectomy femoral Left 01/29/2015    Procedure: ENDARTERECTOMY FEMORAL WITH PATCH ANGIOPLASTY;  Surgeon: Serafina Mitchell, MD;  Location: [place];  Service: Vascular;  Laterality: Left;  . Iliac atherectomy Left 01/29/2015    Procedure: SUPERFICIAL FEMORAL ARTERY ATHERECTOMY/PERCUTANEOUS TRANSLUMINAL ANGIOPLASTY; superficial femoral artery stent;  Surgeon: Serafina Mitchell, MD;  Location: [place];  Service: Vascular;  Laterality: Left;  . Tonsillectomy       Current Outpatient Prescriptions  Medication Sig Dispense Refill  . ALPRAZolam (XANAX) 0.25 MG tablet Take 1 tablet (0.25 mg total) by mouth 2 (two) times daily as needed for anxiety. 60 tablet 5  . amLODipine (NORVASC) 10 MG tablet TAKE 1/2 TABLET BY MOUTH EVERY DAY (Patient taking differently: TAKE 5 MG BY MOUTH EVERY DAY) 90 tablet 0  . aspirin 325 MG EC tablet Take 325 mg by mouth daily. To start back aftrer surgery    . atenolol (TENORMIN) 50 MG tablet TAKE 1 TABLET BY MOUTH EVERY DAY (Patient taking differently: TAKE 50 MG BY MOUTH EVERY DAY) 90 tablet 2  . atorvastatin (LIPITOR) 20 MG tablet TAKE 1 TABLET BY MOUTH EVERY DAY (Patient taking differently: TAKE 20 MG BY MOUTH EVERY DAY) 90 tablet 3  . cholecalciferol (VITAMIN D) 1000 UNITS tablet Take 1,000 Units by mouth daily.     . diazepam (VALIUM) 5 MG tablet Take 1 tablet (5 mg total) by mouth every 6 (six) hours as needed for anxiety. 15 tablet 0  . hydrALAZINE (APRESOLINE) 50 MG tablet TAKE 1 TABLET BY MOUTH THREE TIMES DAILY (Patient taking differently: TAKE 50 MG BY MOUTH THREE TIMES DAILY) 270 tablet 2  . hydrOXYzine (ATARAX/VISTARIL) 10 MG tablet Take 1 tablet (10 mg total) by mouth 3 (three) times daily as needed. 30 tablet 0  . naproxen sodium (ANAPROX) 220 MG tablet Take 440  mg by mouth as needed (for pain).     . traMADol (ULTRAM) 50 MG tablet Take 2 tablets (100 mg total) by mouth 3 (three) times daily as needed. (Patient taking differently: Take 50 mg by mouth 3 (three) times daily as needed for moderate pain. ) 180 tablet 0  . vitamin B-12 (CYANOCOBALAMIN) 1000 MCG tablet Take 1,000 mcg by mouth daily.     No current facility-administered medications for this visit.    Allergies:   Roxicodone; Benazepril; and Itraconazole    Social History:  The patient  reports that he quit smoking about 2 years ago. His smoking use included Cigarettes. He has a 94 pack-year smoking history. He has never  used smokeless tobacco. He reports that he drinks about 2.4 oz of alcohol per week. He reports that he does not use illicit drugs.   Family History:  The patient's family history includes Alzheimer's disease in his father; Cancer in his father and mother; Heart disease in his mother; Hyperlipidemia in his mother; Hypertension in his mother. There is no history of Colon cancer or Heart attack.    ROS:   Please see the history of present illness.   Review of Systems  Hematologic/Lymphatic: Bruises/bleeds easily.  Skin: Positive for rash.  Musculoskeletal: Positive for back pain and joint pain.  All other systems reviewed and are negative.     PHYSICAL EXAM: VS:  BP 140/70 mmHg  Pulse 62  Ht 5\' 5"  (1.651 m)  Wt 180 lb (81.647 kg)  BMI 29.95 kg/m2    Wt Readings from Last 3 Encounters:  06/24/15 180 lb (81.647 kg)  06/22/15 183 lb (83.008 kg)  06/22/15 183 lb 6.4 oz (83.19 kg)     GEN: Well nourished, well developed, in no acute distress HEENT: normal Neck: no JVD, no carotid bruits, no masses Cardiac:  Normal S1/S2, RRR; no murmur ,  no rubs or gallops, no edema   Respiratory:  clear to auscultation bilaterally, no wheezing, rhonchi or rales. GI: soft, nontender, nondistended, + BS MS: no deformity or atrophy Skin: warm and dry  Neuro:  CNs II-XII intact,  Strength and sensation are intact Psych: Normal affect   EKG:  EKG is ordered today.  It demonstrates:   NSR, HR 62, normal axis, first-degree AV block with a PR interval of 276 ms, non-specific ST-TW changes, no change from prior tracing   Recent Labs: 11/05/2014: TSH 3.78 01/20/2015: ALT 74* 06/22/2015: BUN 14; Creatinine, Ser 0.97; Hemoglobin 15.6; Platelets 205; Potassium 3.7; Sodium 134*    Lipid Panel    Component Value Date/Time   CHOL 207* 11/05/2014 0811   TRIG 224.0* 11/05/2014 0811   TRIG 118 09/25/2006 1002   HDL 44.00 11/05/2014 0811   CHOLHDL 5 11/05/2014 0811   CHOLHDL 5.2 CALC 09/25/2006 1002   VLDL 44.8* 11/05/2014 0811   LDLCALC 75 03/13/2014 0841   LDLDIRECT 138.6 11/05/2014 0811      ASSESSMENT AND PLAN:  Pre-operative cardiovascular examination:  He is not having any angina. 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 He may be able to achieve 4 METs. His bypass surgery was a little less than 2 years ago. ECG is unchanged. He just had vascular surgery a few months ago without significant cardiovascular complications. I reviewed his case today with Dr. Radford Pax (DOD). We feel that the patient may proceed with his noncardiac surgery without further cardiovascular testing. He will be at acceptable risk.  Coronary artery disease:  Continue aspirin, beta blocker, statin. Given his coronary artery disease, we recommend continuing aspirin throughout the perioperative period if bleeding risk is acceptable. Otherwise, aspirin should be resumed postoperatively as soon as it is felt to be safe. Patient should remain on beta blocker throughout the perioperative period to reduce the risk of cardiovascular complications.  PVD (peripheral vascular disease): Followed by [facility].    Essential hypertension:  Borderline control. Continue current therapy. Continue to monitor. Arrange follow-up BMET prior to next visit.   Hyperlipidemia:  Continue statin. Arrange  follow-up fasting lipids and LFTs prior to next visit.    Medication Changes: Current medicines are reviewed at length with the patient today.  Concerns regarding medicines are as outlined  above.  The following changes have been made:   Discontinued Medications   ASPIRIN EC 81 MG EC TABLET    Take 1 tablet (81 mg total) by mouth daily.   AVANAFIL (STENDRA) 100 MG TABS    Take 100 mg by mouth daily as needed.   CLOPIDOGREL (PLAVIX) 75 MG TABLET    Take 1 tablet (75 mg total) by mouth daily.   GABAPENTIN (NEURONTIN) 100 MG CAPSULE    Take 1 capsule (100 mg total) by mouth at bedtime.   POTASSIUM CHLORIDE SA (K-DUR,KLOR-CON) 20 MEQ TABLET    Take 1 tablet (20 mEq total) by mouth 2 (two) times daily.   PREDNISONE (DELTASONE) 20 MG TABLET    Take 2 tablets (40 mg total) by mouth daily with breakfast.   RANITIDINE (ZANTAC) 150 MG TABLET    Take 150 mg by mouth 2 (two) times daily as needed for heartburn.    Modified Medications   No medications on file   New Prescriptions   No medications on file     Labs/ tests ordered today include:   Orders Placed This Encounter  Procedures  . Hepatic function panel  . Lipid Profile  . Basic Metabolic Panel (BMET)  . EKG 12-Lead     Disposition:   FU with Dr. Sherren Mocha 6 mos.   Signed, Versie Starks, MHS 06/24/2015 1:08 PM    [facility] [address], [place], [place]  [id number] Phone: [phone]; Fax: [phone]

## 2015-06-24 ENCOUNTER — Ambulatory Visit (INDEPENDENT_AMBULATORY_CARE_PROVIDER_SITE_OTHER): Payer: BLUE CROSS/BLUE SHIELD | Admitting: Physician Assistant

## 2015-06-24 ENCOUNTER — Encounter: Payer: Self-pay | Admitting: Physician Assistant

## 2015-06-24 VITALS — BP 140/70 | HR 62 | Ht 65.0 in | Wt 180.0 lb

## 2015-06-24 DIAGNOSIS — I739 Peripheral vascular disease, unspecified: Secondary | ICD-10-CM | POA: Diagnosis not present

## 2015-06-24 DIAGNOSIS — E785 Hyperlipidemia, unspecified: Secondary | ICD-10-CM

## 2015-06-24 DIAGNOSIS — I10 Essential (primary) hypertension: Secondary | ICD-10-CM | POA: Diagnosis not present

## 2015-06-24 DIAGNOSIS — Z01810 Encounter for preprocedural cardiovascular examination: Secondary | ICD-10-CM | POA: Diagnosis not present

## 2015-06-24 DIAGNOSIS — I2510 Atherosclerotic heart disease of native coronary artery without angina pectoris: Secondary | ICD-10-CM

## 2015-06-24 NOTE — Patient Instructions (Addendum)
Medication Instructions:  Your physician recommends that you continue on your current medications as directed. Please refer to the Current Medication list given to you today.   Labwork: FASTING LIPID AND LIVER PANEL AND BMET TO BE DONE IN 6 MONTHS 1 WEEK BEFORE YOU SEE DR. Burt Knack  Testing/Procedures: NONE  Follow-Up: Your physician wants you to follow-up in: 6 MONTHS WITH DR. Emelda Fear will receive a reminder letter in the mail two months in advance. If you don't receive a letter, please call our office to schedule the follow-up appointment.   Any Other Special Instructions Will Be Listed Below (If Applicable).
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
## 2015-06-25 NOTE — Progress Notes (Signed)
Anesthesia Chart Review:  Pt is 64 year old male scheduled for L total hip arthroplasty on 06/29/2015 with Dr. Mayer Camel.   Cardiologist is Dr. Burt Knack. PCP is Dr. Alain Marion. Vascular surgeon is Dr. Trula Slade.   PMH includes: CAD (s/p CABG 05/06/13 (LIMA to LAD, SEQ SVG to PD-PL, SVG to DIAG, SVG to OM)), HTN, PVD (L CIA and R SFA stents 2008; L EIA 2012; R FPBG 12/27/12; left femoral endarterectomy with VPA, left SFA atherectomy with PTA 01/29/15), hyperlipidemia, OSA (on CPAP), COPD, GERD. Former smoker (quit 2014). BMI 30. S/p femoral endarterectomy with VPA, SFA atherectomy, percutaneous transluminal angioplasty, SFA stent on 01/29/15. S/p CABG 05/06/13. S/p FPBG 12/27/12.   Medications include: amlodipine, ASA, atenolol, lipitor, hydralazine.   Preoperative labs reviewed.    Chest x-ray 06/29/2014 reviewed. No active cardiopulmonary disease.   EKG 08/05/2014: NSR, RSR prime or QR pattern in V1 suggests RVCD.   An EKG was done 06/23/2015 at [facility] appt for pre-op clearance, but has not yet been scanned into Epic. Written interpretation is "NSR, HR 62, normal axis, first-degree AV block with a PR interval of 276 ms, non-specific ST-TW changes, no change from prior tracing"  08/11/14 Echo: - Left ventricle: The cavity size was normal. Wall thickness was normal. Systolic function was normal. The estimated ejection fraction was in the range of 55% to 60%. Features are consistent with a pseudonormal left ventricular filling pattern, with concomitant abnormal relaxation and increased filling pressure (grade 2 diastolic dysfunction). - Left atrium: The atrium was mildly dilated. - Right atrium: The atrium was mildly dilated.  Last cardiac cath was pre-CABG on 05/02/13 following an abnormal nuclear stress test on 04/19/13.  11/30/12 carotid duplex: 1-39% bilateral ICA stenosis.  12/30/14 Abdominal aortogram with BLE run-off: 1.30% right external iliac stenosis. 2. High-grade, near occlusive stenosis in the distal  left common femoral artery with multiple high-grade stenosis with calcific plaque within the of the left superficial femoral artery and reconstitution of the above-knee popliteal artery. 3. Patient will be brought back for consideration of left femoral endarterectomy and above-knee popliteal artery bypass graft with Gore-Tex versus left femoral endarterectomy and percutaneous treatment of the stenosis in the left superficial femoral artery.  Pt has cardiac clearance for surgery in Epic note by Richardson Dopp, PA dated 06/23/2015. Pt has vascular surgery clearance from Dr. Trula Slade in [place] note dated 06/22/15.   If no changes, I anticipate pt can proceed with surgery as scheduled.   Willeen Cass, FNP-BC [facility]/Anesthesiology Phone: [phone] 06/25/2015 3:13 PM

## 2015-06-26 MED ORDER — CEFAZOLIN SODIUM-DEXTROSE 2-3 GM-% IV SOLR
2.0000 g | INTRAVENOUS | Status: AC
  Administered 2015-06-29: 2 g via INTRAVENOUS
  Filled 2015-06-26: qty 50

## 2015-06-26 MED ORDER — DEXTROSE-NACL 5-0.45 % IV SOLN: INTRAVENOUS | Status: DC

## 2015-06-29 ENCOUNTER — Encounter (HOSPITAL_COMMUNITY)
Admission: RE | Disposition: A | Discharge status: Home Health | Payer: Self-pay | Source: Ambulatory Visit | Attending: Orthopedic Surgery

## 2015-06-29 ENCOUNTER — Encounter (HOSPITAL_COMMUNITY): Payer: Self-pay | Admitting: Anesthesiology

## 2015-06-29 ENCOUNTER — Inpatient Hospital Stay (HOSPITAL_COMMUNITY): Payer: BLUE CROSS/BLUE SHIELD | Admitting: Emergency Medicine

## 2015-06-29 ENCOUNTER — Inpatient Hospital Stay (HOSPITAL_COMMUNITY): Payer: BLUE CROSS/BLUE SHIELD

## 2015-06-29 ENCOUNTER — Inpatient Hospital Stay (HOSPITAL_COMMUNITY)
Admission: RE | Admit: 2015-06-29 | Discharge: 2015-07-01 | DRG: 470 | Disposition: A | Discharge status: Home Health | Payer: BLUE CROSS/BLUE SHIELD | Source: Ambulatory Visit | Attending: Orthopedic Surgery | Admitting: Orthopedic Surgery

## 2015-06-29 ENCOUNTER — Inpatient Hospital Stay (HOSPITAL_COMMUNITY): Payer: BLUE CROSS/BLUE SHIELD | Admitting: Anesthesiology

## 2015-06-29 ENCOUNTER — Other Ambulatory Visit: Payer: Self-pay | Admitting: Family Medicine

## 2015-06-29 DIAGNOSIS — K219 Gastro-esophageal reflux disease without esophagitis: Secondary | ICD-10-CM | POA: Diagnosis present

## 2015-06-29 DIAGNOSIS — Z87891 Personal history of nicotine dependence: Secondary | ICD-10-CM | POA: Diagnosis not present

## 2015-06-29 DIAGNOSIS — I739 Peripheral vascular disease, unspecified: Secondary | ICD-10-CM | POA: Diagnosis present

## 2015-06-29 DIAGNOSIS — E559 Vitamin D deficiency, unspecified: Secondary | ICD-10-CM | POA: Diagnosis present

## 2015-06-29 DIAGNOSIS — Z95820 Peripheral vascular angioplasty status with implants and grafts: Secondary | ICD-10-CM

## 2015-06-29 DIAGNOSIS — M87852 Other osteonecrosis, left femur: Principal | ICD-10-CM | POA: Diagnosis present

## 2015-06-29 DIAGNOSIS — Z96649 Presence of unspecified artificial hip joint: Secondary | ICD-10-CM

## 2015-06-29 DIAGNOSIS — E785 Hyperlipidemia, unspecified: Secondary | ICD-10-CM | POA: Diagnosis present

## 2015-06-29 DIAGNOSIS — I2510 Atherosclerotic heart disease of native coronary artery without angina pectoris: Secondary | ICD-10-CM | POA: Diagnosis present

## 2015-06-29 DIAGNOSIS — J449 Chronic obstructive pulmonary disease, unspecified: Secondary | ICD-10-CM | POA: Diagnosis present

## 2015-06-29 DIAGNOSIS — D751 Secondary polycythemia: Secondary | ICD-10-CM | POA: Diagnosis present

## 2015-06-29 DIAGNOSIS — I10 Essential (primary) hypertension: Secondary | ICD-10-CM | POA: Diagnosis present

## 2015-06-29 DIAGNOSIS — M1612 Unilateral primary osteoarthritis, left hip: Secondary | ICD-10-CM | POA: Diagnosis present

## 2015-06-29 DIAGNOSIS — M25552 Pain in left hip: Secondary | ICD-10-CM | POA: Diagnosis present

## 2015-06-29 DIAGNOSIS — G4733 Obstructive sleep apnea (adult) (pediatric): Secondary | ICD-10-CM | POA: Diagnosis present

## 2015-06-29 DIAGNOSIS — M1610 Unilateral primary osteoarthritis, unspecified hip: Secondary | ICD-10-CM | POA: Diagnosis present

## 2015-06-29 DIAGNOSIS — M87052 Idiopathic aseptic necrosis of left femur: Secondary | ICD-10-CM | POA: Diagnosis present

## 2015-06-29 DIAGNOSIS — Z951 Presence of aortocoronary bypass graft: Secondary | ICD-10-CM | POA: Diagnosis not present

## 2015-06-29 HISTORY — DX: Unspecified osteoarthritis, unspecified site: M19.90

## 2015-06-29 HISTORY — PX: TOTAL HIP ARTHROPLASTY: SHX124

## 2015-06-29 SURGERY — ARTHROPLASTY, HIP, TOTAL,POSTERIOR APPROACH
Anesthesia: Spinal | Site: Hip | Laterality: Left

## 2015-06-29 MED ORDER — SUCCINYLCHOLINE CHLORIDE 20 MG/ML IJ SOLN
INTRAMUSCULAR | Status: DC | PRN
  Administered 2015-06-29: 100 mg via INTRAVENOUS

## 2015-06-29 MED ORDER — VECURONIUM BROMIDE 10 MG IV SOLR
INTRAVENOUS | Status: AC
Start: 2015-06-29 — End: 2015-06-29
  Filled 2015-06-29: qty 10

## 2015-06-29 MED ORDER — MEPERIDINE HCL 25 MG/ML IJ SOLN: 6.2500 mg | INTRAMUSCULAR | Status: DC | PRN

## 2015-06-29 MED ORDER — HYDROCODONE-ACETAMINOPHEN 5-325 MG PO TABS
1.0000 | ORAL_TABLET | ORAL | Status: DC | PRN
  Administered 2015-06-29 – 2015-07-01 (×8): 2 via ORAL
  Filled 2015-06-29 (×8): qty 2

## 2015-06-29 MED ORDER — DIPHENHYDRAMINE HCL 12.5 MG/5ML PO ELIX
12.5000 mg | ORAL_SOLUTION | ORAL | Status: DC | PRN
Start: 2015-06-29 — End: 2015-07-01

## 2015-06-29 MED ORDER — HYDROMORPHONE HCL 1 MG/ML IJ SOLN
0.2500 mg | INTRAMUSCULAR | Status: DC | PRN
  Administered 2015-06-29 (×3): 0.5 mg via INTRAVENOUS

## 2015-06-29 MED ORDER — PHENOL 1.4 % MT LIQD: 1.0000 | OROMUCOSAL | Status: DC | PRN

## 2015-06-29 MED ORDER — HYDROMORPHONE HCL 1 MG/ML IJ SOLN
1.0000 mg | INTRAMUSCULAR | Status: DC | PRN
  Administered 2015-06-30 (×2): 1 mg via INTRAVENOUS
  Filled 2015-06-29 (×2): qty 1

## 2015-06-29 MED ORDER — FENTANYL CITRATE (PF) 250 MCG/5ML IJ SOLN
INTRAMUSCULAR | Status: AC
  Filled 2015-06-29: qty 5

## 2015-06-29 MED ORDER — CHLORHEXIDINE GLUCONATE 4 % EX LIQD: 60.0000 mL | Freq: Once | CUTANEOUS | Status: DC

## 2015-06-29 MED ORDER — HYDROMORPHONE HCL 2 MG PO TABS: 2.0000 mg | ORAL_TABLET | ORAL | Status: DC | PRN

## 2015-06-29 MED ORDER — PHENYLEPHRINE HCL 10 MG/ML IJ SOLN
INTRAMUSCULAR | Status: DC | PRN
  Administered 2015-06-29: 80 ug via INTRAVENOUS

## 2015-06-29 MED ORDER — BUPIVACAINE-EPINEPHRINE (PF) 0.5% -1:200000 IJ SOLN
INTRAMUSCULAR | Status: AC
  Filled 2015-06-29: qty 30

## 2015-06-29 MED ORDER — LIDOCAINE HCL (CARDIAC) 20 MG/ML IV SOLN
INTRAVENOUS | Status: AC
  Filled 2015-06-29: qty 5

## 2015-06-29 MED ORDER — ONDANSETRON HCL 4 MG/2ML IJ SOLN: 4.0000 mg | Freq: Four times a day (QID) | INTRAMUSCULAR | Status: DC | PRN

## 2015-06-29 MED ORDER — METOCLOPRAMIDE HCL 5 MG/ML IJ SOLN: 5.0000 mg | Freq: Three times a day (TID) | INTRAMUSCULAR | Status: DC | PRN

## 2015-06-29 MED ORDER — KETOROLAC TROMETHAMINE 30 MG/ML IJ SOLN
INTRAMUSCULAR | Status: AC
  Filled 2015-06-29: qty 1

## 2015-06-29 MED ORDER — HYDRALAZINE HCL 50 MG PO TABS
50.0000 mg | ORAL_TABLET | Freq: Three times a day (TID) | ORAL | Status: DC
  Administered 2015-06-29 – 2015-07-01 (×5): 50 mg via ORAL
  Filled 2015-06-29 (×5): qty 1

## 2015-06-29 MED ORDER — ASPIRIN EC 325 MG PO TBEC: 325.0000 mg | DELAYED_RELEASE_TABLET | Freq: Two times a day (BID) | ORAL | Status: DC

## 2015-06-29 MED ORDER — ACETAMINOPHEN 650 MG RE SUPP: 650.0000 mg | Freq: Four times a day (QID) | RECTAL | Status: DC | PRN

## 2015-06-29 MED ORDER — VECURONIUM BROMIDE 10 MG IV SOLR
INTRAVENOUS | Status: DC | PRN
  Administered 2015-06-29: 1 mg via INTRAVENOUS
  Administered 2015-06-29: 3 mg via INTRAVENOUS

## 2015-06-29 MED ORDER — SODIUM CHLORIDE 0.9 % IV SOLN
INTRAVENOUS | Status: DC | PRN
  Administered 2015-06-29: 60 mL

## 2015-06-29 MED ORDER — SUCCINYLCHOLINE CHLORIDE 20 MG/ML IJ SOLN
INTRAMUSCULAR | Status: AC
  Filled 2015-06-29: qty 1

## 2015-06-29 MED ORDER — METHOCARBAMOL 500 MG PO TABS
500.0000 mg | ORAL_TABLET | Freq: Four times a day (QID) | ORAL | Status: DC | PRN
  Administered 2015-06-29 – 2015-07-01 (×5): 500 mg via ORAL
  Filled 2015-06-29 (×6): qty 1

## 2015-06-29 MED ORDER — FENTANYL CITRATE (PF) 100 MCG/2ML IJ SOLN
INTRAMUSCULAR | Status: DC | PRN
  Administered 2015-06-29 (×5): 50 ug via INTRAVENOUS
  Administered 2015-06-29: 100 ug via INTRAVENOUS

## 2015-06-29 MED ORDER — BUPIVACAINE LIPOSOME 1.3 % IJ SUSP
20.0000 mL | INTRAMUSCULAR | Status: DC
  Filled 2015-06-29: qty 20

## 2015-06-29 MED ORDER — MIDAZOLAM HCL 5 MG/5ML IJ SOLN
INTRAMUSCULAR | Status: DC | PRN
  Administered 2015-06-29: 2 mg via INTRAVENOUS

## 2015-06-29 MED ORDER — ACETAMINOPHEN 325 MG PO TABS: 650.0000 mg | ORAL_TABLET | Freq: Four times a day (QID) | ORAL | Status: DC | PRN

## 2015-06-29 MED ORDER — TRANEXAMIC ACID 1000 MG/10ML IV SOLN
2000.0000 mg | INTRAVENOUS | Status: AC
  Administered 2015-06-29: 2000 mg via TOPICAL
  Filled 2015-06-29: qty 20

## 2015-06-29 MED ORDER — ONDANSETRON HCL 4 MG PO TABS: 4.0000 mg | ORAL_TABLET | Freq: Four times a day (QID) | ORAL | Status: DC | PRN

## 2015-06-29 MED ORDER — KETOROLAC TROMETHAMINE 30 MG/ML IJ SOLN: 30.0000 mg | Freq: Once | INTRAMUSCULAR | Status: DC | PRN

## 2015-06-29 MED ORDER — GLYCOPYRROLATE 0.2 MG/ML IJ SOLN
INTRAMUSCULAR | Status: DC | PRN
  Administered 2015-06-29: 0.6 mg via INTRAVENOUS

## 2015-06-29 MED ORDER — KETOROLAC TROMETHAMINE 30 MG/ML IJ SOLN
INTRAMUSCULAR | Status: DC | PRN
  Administered 2015-06-29 (×2): 15 mg via INTRAVENOUS

## 2015-06-29 MED ORDER — LIDOCAINE HCL (CARDIAC) 20 MG/ML IV SOLN
INTRAVENOUS | Status: DC | PRN
Start: 2015-06-29 — End: 2015-06-29
  Administered 2015-06-29: 100 mg via INTRAVENOUS

## 2015-06-29 MED ORDER — METHOCARBAMOL 500 MG PO TABS: 500.0000 mg | ORAL_TABLET | Freq: Two times a day (BID) | ORAL | Status: DC

## 2015-06-29 MED ORDER — SODIUM CHLORIDE 0.9 % IR SOLN
Status: DC | PRN
  Administered 2015-06-29: 1000 mL

## 2015-06-29 MED ORDER — PROPOFOL 10 MG/ML IV BOLUS
INTRAVENOUS | Status: DC | PRN
  Administered 2015-06-29: 150 mg via INTRAVENOUS

## 2015-06-29 MED ORDER — BUPIVACAINE-EPINEPHRINE 0.5% -1:200000 IJ SOLN
INTRAMUSCULAR | Status: DC | PRN
  Administered 2015-06-29: 10 mL

## 2015-06-29 MED ORDER — DEXAMETHASONE SODIUM PHOSPHATE 10 MG/ML IJ SOLN
10.0000 mg | Freq: Once | INTRAMUSCULAR | Status: AC
  Administered 2015-06-30: 10 mg via INTRAVENOUS
  Filled 2015-06-29: qty 1

## 2015-06-29 MED ORDER — METHOCARBAMOL 1000 MG/10ML IJ SOLN
500.0000 mg | Freq: Four times a day (QID) | INTRAVENOUS | Status: DC | PRN
  Filled 2015-06-29: qty 5

## 2015-06-29 MED ORDER — ALPRAZOLAM 0.25 MG PO TABS: 0.2500 mg | ORAL_TABLET | Freq: Two times a day (BID) | ORAL | Status: DC | PRN

## 2015-06-29 MED ORDER — ALUMINUM HYDROXIDE GEL 320 MG/5ML PO SUSP
15.0000 mL | ORAL | Status: DC | PRN
  Filled 2015-06-29: qty 30

## 2015-06-29 MED ORDER — ACETAMINOPHEN 10 MG/ML IV SOLN
1000.0000 mg | INTRAVENOUS | Status: DC
  Filled 2015-06-29: qty 100

## 2015-06-29 MED ORDER — NEOSTIGMINE METHYLSULFATE 10 MG/10ML IV SOLN
INTRAVENOUS | Status: DC | PRN
  Administered 2015-06-29: 5 mg via INTRAVENOUS

## 2015-06-29 MED ORDER — HYDROMORPHONE HCL 1 MG/ML IJ SOLN
INTRAMUSCULAR | Status: AC
  Filled 2015-06-29: qty 1

## 2015-06-29 MED ORDER — LACTATED RINGERS IV SOLN
INTRAVENOUS | Status: DC
  Administered 2015-06-29 (×2): via INTRAVENOUS

## 2015-06-29 MED ORDER — STERILE WATER FOR INJECTION IJ SOLN
INTRAMUSCULAR | Status: AC
  Filled 2015-06-29: qty 10

## 2015-06-29 MED ORDER — DIAZEPAM 5 MG PO TABS: 5.0000 mg | ORAL_TABLET | Freq: Four times a day (QID) | ORAL | Status: DC | PRN

## 2015-06-29 MED ORDER — GLYCOPYRROLATE 0.2 MG/ML IJ SOLN
INTRAMUSCULAR | Status: AC
  Filled 2015-06-29: qty 3

## 2015-06-29 MED ORDER — METHOCARBAMOL 1000 MG/10ML IJ SOLN
500.0000 mg | INTRAVENOUS | Status: AC
  Administered 2015-06-29: 500 mg via INTRAVENOUS
  Filled 2015-06-29: qty 5

## 2015-06-29 MED ORDER — HYDROXYZINE HCL 10 MG PO TABS
10.0000 mg | ORAL_TABLET | Freq: Three times a day (TID) | ORAL | Status: DC | PRN
  Filled 2015-06-29: qty 1

## 2015-06-29 MED ORDER — ONDANSETRON HCL 4 MG/2ML IJ SOLN
INTRAMUSCULAR | Status: DC | PRN
  Administered 2015-06-29: 4 mg via INTRAVENOUS

## 2015-06-29 MED ORDER — NEOSTIGMINE METHYLSULFATE 10 MG/10ML IV SOLN
INTRAVENOUS | Status: AC
  Filled 2015-06-29: qty 1

## 2015-06-29 MED ORDER — AMLODIPINE BESYLATE 5 MG PO TABS
5.0000 mg | ORAL_TABLET | Freq: Every day | ORAL | Status: DC
  Administered 2015-06-30 – 2015-07-01 (×2): 5 mg via ORAL
  Filled 2015-06-29 (×2): qty 1

## 2015-06-29 MED ORDER — PROMETHAZINE HCL 25 MG/ML IJ SOLN: 6.2500 mg | INTRAMUSCULAR | Status: DC | PRN

## 2015-06-29 MED ORDER — METOCLOPRAMIDE HCL 5 MG PO TABS: 5.0000 mg | ORAL_TABLET | Freq: Three times a day (TID) | ORAL | Status: DC | PRN

## 2015-06-29 MED ORDER — MENTHOL 3 MG MT LOZG
1.0000 | LOZENGE | OROMUCOSAL | Status: DC | PRN
Start: 2015-06-29 — End: 2015-07-01

## 2015-06-29 MED ORDER — ASPIRIN EC 325 MG PO TBEC
325.0000 mg | DELAYED_RELEASE_TABLET | Freq: Every day | ORAL | Status: DC
  Administered 2015-06-30 – 2015-07-01 (×2): 325 mg via ORAL
  Filled 2015-06-29 (×2): qty 1

## 2015-06-29 MED ORDER — ATENOLOL 50 MG PO TABS
50.0000 mg | ORAL_TABLET | Freq: Every day | ORAL | Status: DC
  Administered 2015-06-30 – 2015-07-01 (×2): 50 mg via ORAL
  Filled 2015-06-29 (×2): qty 1

## 2015-06-29 MED ORDER — MIDAZOLAM HCL 2 MG/2ML IJ SOLN
INTRAMUSCULAR | Status: AC
  Filled 2015-06-29: qty 2

## 2015-06-29 MED ORDER — KCL IN DEXTROSE-NACL 20-5-0.45 MEQ/L-%-% IV SOLN
INTRAVENOUS | Status: DC
Start: 2015-06-29 — End: 2015-07-01
  Administered 2015-06-29 – 2015-06-30 (×2): via INTRAVENOUS
  Filled 2015-06-29 (×2): qty 1000

## 2015-06-29 SURGICAL SUPPLY — 54 items
BLADE SAW SGTL 18X1.27X75 (BLADE) ×2 IMPLANT
BRUSH FEMORAL CANAL (MISCELLANEOUS) IMPLANT
CAPT HIP TOTAL 2 ×2 IMPLANT
COVER BACK TABLE 24X17X13 BIG (DRAPES) IMPLANT
COVER SURGICAL LIGHT HANDLE (MISCELLANEOUS) ×2 IMPLANT
DRAPE IMP U-DRAPE 54X76 (DRAPES) ×2 IMPLANT
DRAPE ORTHO SPLIT 77X108 STRL (DRAPES) ×1
DRAPE PROXIMA HALF (DRAPES) ×2 IMPLANT
DRAPE SURG ORHT 6 SPLT 77X108 (DRAPES) ×1 IMPLANT
DRAPE U-SHAPE 47X51 STRL (DRAPES) ×2 IMPLANT
DRILL BIT 7/64X5 (BIT) ×2 IMPLANT
DRSG AQUACEL AG ADV 3.5X10 (GAUZE/BANDAGES/DRESSINGS) ×2 IMPLANT
DURAPREP 26ML APPLICATOR (WOUND CARE) ×4 IMPLANT
ELECT BLADE 4.0 EZ CLEAN MEGAD (MISCELLANEOUS) ×2
ELECT REM PT RETURN 9FT ADLT (ELECTROSURGICAL) ×2
ELECTRODE BLDE 4.0 EZ CLN MEGD (MISCELLANEOUS) ×1 IMPLANT
ELECTRODE REM PT RTRN 9FT ADLT (ELECTROSURGICAL) ×1 IMPLANT
GLOVE BIO SURGEON STRL SZ7.5 (GLOVE) ×2 IMPLANT
GLOVE BIO SURGEON STRL SZ8.5 (GLOVE) ×4 IMPLANT
GLOVE BIOGEL PI IND STRL 8 (GLOVE) ×2 IMPLANT
GLOVE BIOGEL PI IND STRL 9 (GLOVE) ×1 IMPLANT
GLOVE BIOGEL PI INDICATOR 8 (GLOVE) ×2
GLOVE BIOGEL PI INDICATOR 9 (GLOVE) ×1
GOWN STRL REUS W/ TWL LRG LVL3 (GOWN DISPOSABLE) ×2 IMPLANT
GOWN STRL REUS W/ TWL XL LVL3 (GOWN DISPOSABLE) ×3 IMPLANT
GOWN STRL REUS W/TWL LRG LVL3 (GOWN DISPOSABLE) ×2
GOWN STRL REUS W/TWL XL LVL3 (GOWN DISPOSABLE) ×3
HANDPIECE INTERPULSE COAX TIP (DISPOSABLE)
HOOD PEEL AWAY FACE SHEILD DIS (HOOD) ×4 IMPLANT
KIT BASIN OR (CUSTOM PROCEDURE TRAY) ×2 IMPLANT
KIT ROOM TURNOVER OR (KITS) ×2 IMPLANT
MANIFOLD NEPTUNE II (INSTRUMENTS) ×2 IMPLANT
NEEDLE 22X1 1/2 (OR ONLY) (NEEDLE) IMPLANT
NEEDLE SPNL 18GX3.5 QUINCKE PK (NEEDLE) ×2 IMPLANT
NS IRRIG 1000ML POUR BTL (IV SOLUTION) ×2 IMPLANT
PACK TOTAL JOINT (CUSTOM PROCEDURE TRAY) ×2 IMPLANT
PACK UNIVERSAL I (CUSTOM PROCEDURE TRAY) ×2 IMPLANT
PAD ARMBOARD 7.5X6 YLW CONV (MISCELLANEOUS) ×4 IMPLANT
PASSER SUT SWANSON 36MM LOOP (INSTRUMENTS) ×2 IMPLANT
PRESSURIZER FEMORAL UNIV (MISCELLANEOUS) IMPLANT
SET HNDPC FAN SPRY TIP SCT (DISPOSABLE) IMPLANT
SUT ETHIBOND 2 V 37 (SUTURE) ×2 IMPLANT
SUT VIC AB 0 CTB1 27 (SUTURE) ×2 IMPLANT
SUT VIC AB 1 CTX 36 (SUTURE) ×1
SUT VIC AB 1 CTX36XBRD ANBCTR (SUTURE) ×1 IMPLANT
SUT VIC AB 2-0 CTB1 (SUTURE) ×2 IMPLANT
SUT VIC AB 3-0 SH 27 (SUTURE) ×1
SUT VIC AB 3-0 SH 27X BRD (SUTURE) ×1 IMPLANT
SYR CONTROL 10ML LL (SYRINGE) IMPLANT
TOWEL OR 17X24 6PK STRL BLUE (TOWEL DISPOSABLE) ×2 IMPLANT
TOWEL OR 17X26 10 PK STRL BLUE (TOWEL DISPOSABLE) ×2 IMPLANT
TOWER CARTRIDGE SMART MIX (DISPOSABLE) IMPLANT
TRAY FOLEY CATH 14FR (SET/KITS/TRAYS/PACK) IMPLANT
WATER STERILE IRR 1000ML POUR (IV SOLUTION) IMPLANT

## 2015-06-29 NOTE — Progress Notes (Signed)
Patient arrived to room from PACU with family at bedside. Safety precautions and orders reviewed with patient/family. Will continue to monitor.

## 2015-06-29 NOTE — Interval H&P Note (Signed)
History and Physical Interval Note:  06/29/2015 9:35 AM  Douglas Christensen  has presented today for surgery, with the diagnosis of LEFT HIP AVASCULAR NECROSIS  The various methods of treatment have been discussed with the patient and family. After consideration of risks, benefits and other options for treatment, the patient has consented to  Procedure(s) with comments: TOTAL HIP ARTHROPLASTY (Left) - [place] as a surgical intervention .  The patient's history has been reviewed, patient examined, no change in status, stable for surgery.  I have reviewed the patient's chart and labs.  Questions were answered to the patient's satisfaction.     [name]

## 2015-06-29 NOTE — Transfer of Care (Signed)
Immediate Anesthesia Transfer of Care Note  Patient: Douglas Christensen  Procedure(s) Performed: Procedure(s) with comments: TOTAL HIP ARTHROPLASTY (Left) - LEFT TOTAL HIP ARTHROPLASTY DEPUY SROM/PINNACLE  Patient Location: PACU  Anesthesia Type:General  Level of Consciousness: awake, oriented and patient cooperative  Airway & Oxygen Therapy: Patient Spontanous Breathing and Patient connected to face mask oxygen  Post-op Assessment: Report given to RN and Post -op Vital signs reviewed and stable  Post vital signs: Reviewed  Last Vitals:  Filed Vitals:   06/29/15 1210  BP: 157/81  Pulse: 89  Temp: 36.4 C  Resp: 14    Complications: No apparent anesthesia complications

## 2015-06-29 NOTE — Op Note (Signed)
OPERATIVE REPORT    DATE OF PROCEDURE:  06/29/2015       PREOPERATIVE DIAGNOSIS:  LEFT HIP AVASCULAR NECROSIS                                                          POSTOPERATIVE DIAGNOSIS:  LEFT HIP AVASCULAR NECROSIS                                                           PROCEDURE:  L total hip arthroplasty using a 52 mm DePuy Pinnacle  Cup, [employer], 10-degree polyethylene liner index superior  and posterior, a +0 36 mm ceramic head, a 18x13x42x150 SROM stem, 18FL Sleeve   SURGEON: Samanatha Brammer J    ASSISTANT:   [employer]  (present throughout entire procedure and necessary for timely completion of the procedure)   ANESTHESIA: GET BLOOD LOSS: 300 FLUID REPLACEMENT: 1600 crystalloid Antibiotic: 2gm ancef Tranexamic Acid: 2gm topical Exparel: yes COMPLICATIONS: none    INDICATIONS FOR PROCEDURE: A 64 y.o. year-old With  [place]   for 2 years, x-rays show bone-on-bone arthritic changes. Despite conservative measures with observation, anti-inflammatory medicine, narcotics, use of a cane, has severe unremitting pain and can ambulate only a few blocks before resting.  Patient desires elective L total hip arthroplasty to decrease pain and increase function. The risks, benefits, and alternatives were discussed at length including but not limited to the risks of infection, bleeding, nerve injury, stiffness, blood clots, the need for revision surgery, cardiopulmonary complications, among others, and they were willing to proceed. Questions answered     PROCEDURE IN DETAIL: The patient was identified by armband,  received preoperative IV antibiotics in the holding area at [facility], taken to the operating room , appropriate anesthetic monitors  were attached and general endotracheal anesthesia induced. Pt was rolled into the R lateral decubitus position and fixed there with a Stulberg Mark II pelvic clamp.  The L lower extremity was then  prepped and draped  in the usual sterile fashion from the ankle to the hemipelvis. A time-out  procedure was performed. The skin along the lateral hip and thigh  infiltrated with 10 mL of 0.5% Marcaine and epinephrine solution. We  then made a posterolateral approach to the hip. With a #10 blade, a 14 cm  incision was made through the skin and subcutaneous tissue down to the level of the  IT band. Small bleeders were identified and cauterized. The IT band was cut in  line with skin incision exposing the greater trochanter. A Cobra retractor was placed between the gluteus minimus and the superior hip joint capsule, and a spiked Cobra between the quadratus femoris and the inferior hip joint capsule. This isolated the short  external rotators and piriformis tendons. These were tagged with a #2 Ethibond  suture and cut off their insertion on the intertrochanteric crest. The posterior  capsule was then developed into an acetabular-based flap from Posterior Superior off of the acetabulum out over the femoral neck and back posterior inferior to the acetabular rim. This flap was tagged with two #  2 Ethibond sutures and retracted protecting the sciatic nerve. This exposed the arthritic femoral head and osteophytes. The hip was then flexed and internally rotated, dislocating the femoral head and a standard neck cut performed 1 fingerbreadth above the lesser trochanter.  A spiked Cobra was placed in the cotyloid notch and a Hohmann retractor was then used to lever the femur anteriorly off of the anterior pelvic column. A posterior-inferior wing retractor was placed at the junction of the acetabulum and the ischium completing the acetabular exposure.We then removed the peripheral osteophytes and labrum from the acetabulum. We then reamed the acetabulum up to 51 mm with basket reamers obtaining good coverage in all quadrants. We then irrigated with normal  saline solution and hammered into place a 52 mm pinnacle cup  in 45  degrees of abduction and about 25 degrees of anteversion. More  peripheral osteophytes removed and a trial 10-degree liner placed with the  index superior-posterior. The hip was then flexed and internally rotated exposing the  proximal femur, which was entered with the initiating reamer followed by  the axial reamers up to a 13.5 mm full depth and 68mm partial depth. We then conically reamed to 51F to the correct depth for a 36 base neck. The calcar was milled to 51FL. A trial sleeve and stem was inserted in the 20 degrees anteversion, with a +0 61mm trial head. Trial reduction was then performed and excellent stability was noted with at 90 of flexion with 75 of internal rotation and then full extension with maximal external rotation. The hip could not be dislocated in full extension. The knee could easily flex  to about 130 degrees. We also stretched the abductors at this point,  because of the preexisting adductor contractures. All trial components  were then removed. The acetabulum was irrigated out with normal saline  solution. A titanium [facility] was then screwed into place  followed by a 10-degree polyethylene liner index superior-posterior. On  the femoral side a 51FL ZTT1 sleeve was hammered into place, followed by a 18x13x36x150 SROM stem in 25 degrees of anteversion. At this point, a +3 36 mm ceramic head was  hammered on the stem. The hip was reduced. We checked our stability  one more time and found it to be excellent. The wound was once again  thoroughly irrigated out with normal saline solution hand lavage. The  capsular flap and short external rotators were repaired back to the  intertrochanteric crest through drill holes with a #2 Ethibond suture.  The IT band was closed with running 1 Vicryl suture. The subcutaneous  tissue with 0 and 2-0 undyed Vicryl suture and the skin with running  3-0 vicryl subcuticular suture. Aquacil dressing was applied. The patient was  then unclamped, rolled supine, awaken extubated and taken to recovery room without difficulty in stable condition.   Maxxwell Edgett J 06/29/2015, 11:29 AM

## 2015-06-29 NOTE — Anesthesia Preprocedure Evaluation (Addendum)
Anesthesia Evaluation  Patient identified by MRN, date of birth, ID band Patient awake    Reviewed: Allergy & Precautions, H&P , NPO status , Patient's Chart, lab work & pertinent test results, reviewed documented beta blocker date and time   History of Anesthesia Complications Negative for: history of anesthetic complications  Airway Mallampati: I  TM Distance: >3 FB Neck ROM: Full    Dental  (+) Poor Dentition, Dental Advisory Given,    Pulmonary shortness of breath, sleep apnea and Continuous Positive Airway Pressure Ventilation , COPDCurrent Smoker, former smoker,  + rhonchi         Cardiovascular hypertension, Pt. on medications and Pt. on home beta blockers + CAD, + CABG and + Peripheral Vascular Disease Rhythm:Regular Rate:Normal  Echo 07/2014 - Left ventricle: The cavity size was normal. Wall thickness wasnormal. Systolic function was normal. The estimated ejectionfraction was in the range of 55% to 60%. Features are consistentwith a pseudonormal left ventricular filling pattern, withconcomitant abnormal relaxation and increased filling pressure(grade 2 diastolic dysfunction). - Left atrium: The atrium was mildly dilated. - Right atrium: The atrium was mildly dilated.    Neuro/Psych PSYCHIATRIC DISORDERS Anxiety  Neuromuscular disease    GI/Hepatic GERD-  ,  Endo/Other    Renal/GU      Musculoskeletal  (+) Arthritis -,   Abdominal   Peds  Hematology   Anesthesia Other Findings   Reproductive/Obstetrics                         Anesthesia Physical  Anesthesia Plan  ASA: III  Anesthesia Plan: Spinal   Post-op Pain Management:    Induction: Intravenous  Airway Management Planned:   Additional Equipment:   Intra-op Plan:   Post-operative Plan:   Informed Consent: I have reviewed the patients History and Physical, chart, labs and discussed the procedure including the  risks, benefits and alternatives for the proposed anesthesia with the patient or authorized representative who has indicated his/her understanding and acceptance.   Dental advisory given  Plan Discussed with: CRNA  Anesthesia Plan Comments:         Anesthesia Quick Evaluation

## 2015-06-29 NOTE — Anesthesia Procedure Notes (Addendum)
Procedure Name: Intubation Date/Time: 06/29/2015 10:26 AM Performed by: Jenne Campus Pre-anesthesia Checklist: Patient identified, Emergency Drugs available, Suction available, Patient being monitored and Timeout performed Patient Re-evaluated:Patient Re-evaluated prior to inductionOxygen Delivery Method: Circle system utilized Preoxygenation: Pre-oxygenation with 100% oxygen Intubation Type: IV induction Ventilation: Mask ventilation without difficulty and Oral airway inserted - appropriate to patient size Grade View: Grade I Tube type: Oral Tube size: 7.5 mm Number of attempts: 1 Airway Equipment and Method: Stylet and Video-laryngoscopy Placement Confirmation: positive ETCO2,  CO2 detector and breath sounds checked- equal and bilateral Secured at: 22 cm Tube secured with: Tape Dental Injury: Teeth and Oropharynx as per pre-operative assessment  Difficulty Due To: Difficult Airway- due to limited oral opening and Difficult Airway- due to dentition Future Recommendations: Recommend- induction with short-acting agent, and alternative techniques readily available Comments: Smooth IV induction. EZ mask with 73mm OA. DL x 1 video-glide blade 3. Front upper teeth jagged and chipped. Barrier placed between teeth and laryngoscope blade. Atraum oral intubation. +ETCO2. Bbs=.

## 2015-06-29 NOTE — Evaluation (Signed)
Physical Therapy Evaluation Patient Details Name: Douglas Christensen MRN: [id number] DOB: 1951/04/13 Today's Date: 06/29/2015   History of Present Illness  Patient is a 64 y/o male s/p L THA, posterior approach. PMH includes COPD, CAD, HTN, PVD, sleep apnea.  Clinical Impression  Patient presents with pain, lethargy and post surgical deficits LLE s/p L THA. Ambulation distance limited due to lethargy however pt tolerated side stepping along side bed with min guard for safety. Education provided on posterior hip precautions and handout provided. Pt lives alone and will work on getting support at home at d/c. Will follow acutely to maximize independence and mobility prior to return home.     Follow Up Recommendations Home health PT;Supervision/Assistance - 24 hour    Equipment Recommendations  None recommended by PT    Recommendations for Other Services OT consult     Precautions / Restrictions Precautions Precautions: Posterior Hip Precaution Booklet Issued: Yes (comment) Precaution Comments: Reviewed precautions. Restrictions Weight Bearing Restrictions: Yes LLE Weight Bearing: Weight bearing as tolerated      Mobility  Bed Mobility Overal bed mobility: Needs Assistance Bed Mobility: Supine to Sit;Sit to Supine     Supine to sit: Min assist;HOB elevated Sit to supine: Supervision   General bed mobility comments: Min A to scoot bottom to EOB. ABle to bring BLES into bed without assist.   Transfers Overall transfer level: Needs assistance Equipment used: Rolling walker (2 wheeled) Transfers: Sit to/from Stand Sit to Stand: Min guard         General transfer comment: Min guard for safety.   Ambulation/Gait Ambulation/Gait assistance: Min guard Ambulation Distance (Feet): 5 Feet Assistive device: Rolling walker (2 wheeled) Gait Pattern/deviations: Step-to pattern;Decreased stride length;Trunk flexed   Gait velocity interpretation: Below normal speed for age/gender General  Gait Details: Pt able to side step along bed to left. Min guard for safety/balance. Lethargic due to being given pain meds prior to PT arrival.   Stairs            Wheelchair Mobility    Modified Rankin (Stroke Patients Only)       Balance Overall balance assessment: Needs assistance Sitting-balance support: Feet supported;No upper extremity supported Sitting balance-Leahy Scale: Good     Standing balance support: During functional activity Standing balance-Leahy Scale: Poor Standing balance comment: Relient on RW for support.                              Pertinent Vitals/Pain Pain Assessment: Faces Faces Pain Scale: Hurts little more Pain Location: left hip Pain Descriptors / Indicators: Sore Pain Intervention(s): Monitored during session;Repositioned;Limited activity within patient's tolerance;Premedicated before session    Home Living Family/patient expects to be discharged to:: Private residence Living Arrangements: Alone Available Help at Discharge: Family;Friend(s);Available PRN/intermittently Type of Home: House Home Access: Stairs to enter Entrance Stairs-Rails: Right Entrance Stairs-Number of Steps: 4 Home Layout: One level Home Equipment: Walker - 2 wheels;Cane - single point      Prior Function Level of Independence: Independent with assistive device(s)         Comments: Pt using RW vs SPC PTA.      Hand Dominance   Dominant Hand: Right    Extremity/Trunk Assessment   Upper Extremity Assessment: Defer to OT evaluation           Lower Extremity Assessment: LLE deficits/detail   LLE Deficits / Details: Able to perform LAQ. Grossly ~3/5 throughout, hip flexion AROM/strengh  not assessed due to pain/surgery.     Communication   Communication: No difficulties  Cognition Arousal/Alertness: Lethargic;Suspect due to medications Behavior During Therapy: [facility] for tasks assessed/performed Overall Cognitive Status: Within  Functional Limits for tasks assessed       Memory: Decreased recall of precautions              General Comments      Exercises Total Joint Exercises Ankle Circles/Pumps: Both;10 reps;Supine Quad Sets: Both;10 reps;Supine Short Arc Quad: Both;10 reps;Supine      Assessment/Plan    PT Assessment Patient needs continued PT services  PT Diagnosis Difficulty walking;Acute pain;Generalized weakness   PT Problem List Decreased strength;Pain;Decreased range of motion;Impaired sensation;Decreased activity tolerance;Decreased balance;Decreased mobility;Decreased knowledge of precautions  PT Treatment Interventions Balance training;Stair training;Gait training;Functional mobility training;Therapeutic activities;Therapeutic exercise;Patient/family education   PT Goals (Current goals can be found in the Care Plan section) Acute Rehab PT Goals Patient Stated Goal: to be able to walk pain free PT Goal Formulation: With patient Time For Goal Achievement: 07/13/15 Potential to Achieve Goals: Good    Frequency 7X/week   Barriers to discharge Decreased caregiver support;Inaccessible home environment Pt lives alone and has 4 steps to enter to get into home.    Co-evaluation               End of Session Equipment Utilized During Treatment: Gait belt Activity Tolerance: Patient tolerated treatment well;Patient limited by lethargy Patient left: in bed;with call bell/phone within reach;with family/visitor present;with SCD's reapplied Nurse Communication: Mobility status;Precautions         Time: [id number] PT Time Calculation (min) (ACUTE ONLY): 21 min   Charges:   PT Evaluation $Initial PT Evaluation Tier I: 1 Procedure     PT G Codes:        Douglas Christensen 06/29/2015, 3:26 PM  Douglas Christensen, Douglas Christensen, DPT [phone]

## 2015-06-29 NOTE — Discharge Instructions (Signed)

## 2015-06-29 NOTE — Care Management Utilization Note (Signed)
Utilization review completed. Kymberley Raz, RN Case Manager [phone]. 

## 2015-06-30 ENCOUNTER — Encounter (HOSPITAL_COMMUNITY): Payer: Self-pay | Admitting: Orthopedic Surgery

## 2015-06-30 LAB — BASIC METABOLIC PANEL
Anion gap: 7 (ref 5–15)
BUN: 7 mg/dL (ref 6–20)
CALCIUM: 8.3 mg/dL — AB (ref 8.9–10.3)
CO2: 25 mmol/L (ref 22–32)
CREATININE: 0.8 mg/dL (ref 0.61–1.24)
Chloride: 101 mmol/L (ref 101–111)
GFR calc Af Amer: >60 mL/min (ref >60)
GLUCOSE: 143 mg/dL — AB (ref 65–99)
Potassium: 3.6 mmol/L (ref 3.5–5.1)
SODIUM: 133 mmol/L — AB (ref 135–145)

## 2015-06-30 LAB — CBC
HEMATOCRIT: 37.8 % — AB (ref 39.0–52.0)
Hemoglobin: 13.1 g/dL (ref 13.0–17.0)
MCH: 33.2 pg (ref 26.0–34.0)
MCHC: 34.7 g/dL (ref 30.0–36.0)
MCV: 95.7 fL (ref 78.0–100.0)
Platelets: 278 10*3/uL (ref 150–400)
RBC: 3.95 MIL/uL — ABNORMAL LOW (ref 4.22–5.81)
RDW: 13.4 % (ref 11.5–15.5)
WBC: 9 10*3/uL (ref 4.0–10.5)

## 2015-06-30 NOTE — Progress Notes (Signed)
Physical Therapy Treatment Patient Details Name: Douglas Christensen MRN: [id number] DOB: 12/27/1950 Today's Date: 06/30/2015    History of Present Illness Patient is a 64 y/o male s/p L THA, posterior approach. PMH includes COPD, CAD, HTN, PVD, sleep apnea.    PT Comments    Patient progressing well towards PT goals. Improved ambulation distance this PM. Tolerated dynamic standing activities with Min guard assist for safety. Pt reports increased pain from AM session esp when not moving. Encouraged mobilizing every hour to minimize stiffness/soreness. Will follow acutely to maximize independence and mobility.   Follow Up Recommendations  SNF     Equipment Recommendations  None recommended by PT    Recommendations for Other Services       Precautions / Restrictions Precautions Precautions: Posterior Hip Precaution Booklet Issued: Yes (comment) Precaution Comments: Reviewed precautions. Pt only able to recall 2/3 Restrictions Weight Bearing Restrictions: Yes LLE Weight Bearing: Weight bearing as tolerated    Mobility  Bed Mobility               General bed mobility comments: Sitting in chair upon PT arrival.   Transfers Overall transfer level: Needs assistance Equipment used: Rolling walker (2 wheeled) Transfers: Sit to/from Stand Sit to Stand: Supervision         General transfer comment: SUpervision for safety. Stood from chair x2, from toilet x1.   Ambulation/Gait Ambulation/Gait assistance: Supervision Ambulation Distance (Feet): 100 Feet Assistive device: Rolling walker (2 wheeled) Gait Pattern/deviations: Step-to pattern;Decreased stride length;Decreased stance time - left;Decreased step length - right;Trunk flexed;Antalgic   Gait velocity interpretation: Below normal speed for age/gender General Gait Details: Cues for step through gait and upright posture.    Stairs            Wheelchair Mobility    Modified Rankin (Stroke Patients Only)        Balance Overall balance assessment: Needs assistance Sitting-balance support: Feet supported;No upper extremity supported Sitting balance-Leahy Scale: Good     Standing balance support: During functional activity Standing balance-Leahy Scale: Fair Standing balance comment: Tolerated dynamic standing activities- brushing teeth, pericare without UE support for short period.                    Cognition Arousal/Alertness: Awake/alert Behavior During Therapy: WFL for tasks assessed/performed Overall Cognitive Status: Within Functional Limits for tasks assessed       Memory: Decreased recall of precautions              Exercises      General Comments        Pertinent Vitals/Pain Pain Assessment: 0-10 Pain Score: 7  Pain Location: left hip Pain Descriptors / Indicators: Sore Pain Intervention(s): Monitored during session;Premedicated before session;Repositioned    Home Living                      Prior Function            PT Goals (current goals can now be found in the care plan section) Progress towards PT goals: Progressing toward goals    Frequency  7X/week    PT Plan Current plan remains appropriate    Co-evaluation             End of Session Equipment Utilized During Treatment: Gait belt Activity Tolerance: Patient tolerated treatment well Patient left: in chair;with call bell/phone within reach     Time: [id number] PT Time Calculation (min) (ACUTE ONLY): 34 min  Charges:  $  Gait Training: 8-22 mins $Therapeutic Activity: 8-22 mins                    G Codes:      Daryl Quiros A Matyas Baisley 06/30/2015, 2:52 PM Wray Kearns, [place], DPT [phone]

## 2015-06-30 NOTE — Progress Notes (Signed)
Patient ID: Douglas Christensen, male   DOB: Nov 19, 1951, 64 y.o.   MRN: [id number] PATIENT ID: Douglas Christensen  MRN: [id number]  DOB/AGE:  [birth year] / 64 y.o.  1 Day Post-Op Procedure(s) (LRB): TOTAL HIP ARTHROPLASTY (Left)    PROGRESS NOTE Subjective: Patient is alert, oriented, no Nausea, no Vomiting, yes passing gas, no Bowel Movement. Taking PO well. Denies SOB, Chest or Calf Pain. Using Incentive Spirometer, PAS in place. Ambulate WBAT Patient reports pain as 4 on 0-10 scale  .    Objective: Vital signs in last 24 hours: Filed Vitals:   06/29/15 1714 06/29/15 2128 06/30/15 0117 06/30/15 0458  BP: 119/68 136/72 132/71 111/63  Pulse: 79 72 76 89  Temp: 97.9 F (36.6 C) 98.1 F (36.7 C) 97.8 F (36.6 C) 99.1 F (37.3 C)  TempSrc: Oral Oral Axillary Axillary  Resp:  16 18 18   Height:      Weight:      SpO2: 92% 95% 96% 88%      Intake/Output from previous day: I/O last 3 completed shifts: In: 3996.7 [P.O.:480; I.V.:3516.7] Out: 1300 [Urine:1300]   Intake/Output this shift:     LABORATORY DATA:  Recent Labs  06/30/15 0435  WBC 9.0  HGB 13.1  HCT 37.8*  PLT 278  NA 133*  K 3.6  CL 101  CO2 25  BUN 7  CREATININE 0.80  GLUCOSE 143*  CALCIUM 8.3*    Examination: Neurologically intact ABD soft Neurovascular intact Sensation intact distally Intact pulses distally Dorsiflexion/Plantar flexion intact Incision: dressing C/D/I No cellulitis present Compartment soft} XR AP&Lat of hip shows well placed\fixed THA  Assessment:   1 Day Post-Op Procedure(s) (LRB): TOTAL HIP ARTHROPLASTY (Left) ADDITIONAL DIAGNOSIS:  Expected Acute Blood Loss Anemia, Hypertension, CAD, PVD  Plan: PT/OT WBAT, THA  posterior precautions  DVT Prophylaxis: SCDx72 hrs, ASA 325 mg BID x 2 weeks  DISCHARGE PLAN: Skilled Nursing Facility/Rehab  DISCHARGE NEEDS: HHPT, Walker and 3-in-1 comode seat

## 2015-06-30 NOTE — Progress Notes (Signed)
Physical Therapy Treatment Patient Details Name: Douglas Christensen MRN: [id number] DOB: 1951-04-01 Today's Date: 06/30/2015    History of Present Illness Patient is a 64 y/o male s/p L THA, posterior approach. PMH includes COPD, CAD, HTN, PVD, sleep apnea.    PT Comments    Patient progressing well towards PT goals. Instructed pt in exercises and provided handout. Improved ambulation distance from prior session. Pt able to recall 1/3 hip precautions. Discharge recommendation updated to ST SNF as pt lives alone and does not have support. Will follow acutely to maximize independence and mobility.   Follow Up Recommendations  SNF     Equipment Recommendations  None recommended by PT    Recommendations for Other Services       Precautions / Restrictions Precautions Precautions: Posterior Hip Precaution Booklet Issued: Yes (comment) Precaution Comments: Reviewed precautions. Pt only able to recall 1/3. Restrictions Weight Bearing Restrictions: Yes LLE Weight Bearing: Weight bearing as tolerated    Mobility  Bed Mobility Overal bed mobility: Needs Assistance Bed Mobility: Rolling;Supine to Sit Rolling: Min assist   Supine to sit: Min assist     General bed mobility comments: Sitting in chair upon PT arrival.   Transfers Overall transfer level: Needs assistance Equipment used: Rolling walker (2 wheeled) Transfers: Sit to/from Stand Sit to Stand: Min guard         General transfer comment: Min guard for safety. Cues for hand placement and sequencing.   Ambulation/Gait Ambulation/Gait assistance: Min guard Ambulation Distance (Feet): 75 Feet Assistive device: Rolling walker (2 wheeled) Gait Pattern/deviations: Step-to pattern;Decreased stride length;Decreased stance time - left;Decreased step length - right;Trunk flexed   Gait velocity interpretation: Below normal speed for age/gender General Gait Details: Pt with hip ER initially with cues for neutral hip positioning  during gait. Cues for step through gait.    Stairs            Wheelchair Mobility    Modified Rankin (Stroke Patients Only)       Balance Overall balance assessment: Needs assistance Sitting-balance support: Feet supported;No upper extremity supported Sitting balance-Leahy Scale: Good     Standing balance support: During functional activity Standing balance-Leahy Scale: Fair                      Cognition Arousal/Alertness: Awake/alert Behavior During Therapy: WFL for tasks assessed/performed Overall Cognitive Status: Within Functional Limits for tasks assessed       Memory: Decreased recall of precautions              Exercises Total Joint Exercises Ankle Circles/Pumps: Both;10 reps;Seated Quad Sets: Both;10 reps;Seated Gluteal Sets: Both;10 reps;Seated Hip ABduction/ADduction: Left;AAROM;10 reps;Seated Long Arc Quad: Left;10 reps;Seated    General Comments        Pertinent Vitals/Pain Pain Assessment: No/denies pain Pain Score: 0-No pain    Home Living Family/patient expects to be discharged to:: Private residence Living Arrangements: Alone Available Help at Discharge: Family;Friend(s);Available PRN/intermittently Type of Home: House Home Access: Stairs to enter Entrance Stairs-Rails: Right Home Layout: One level Home Equipment: Walker - 2 wheels;Cane - single point Additional Comments: pt's bathroom will not fit 3N1 over toilet due to proximity of tub.    Prior Function Level of Independence: Independent with assistive device(s)      Comments: Pt using RW vs SPC PTA.    PT Goals (current goals can now be found in the care plan section) Acute Rehab PT Goals Patient Stated Goal: go to rehab before  going home  Progress towards PT goals: Progressing toward goals    Frequency  7X/week    PT Plan Discharge plan needs to be updated    Co-evaluation             End of Session Equipment Utilized During Treatment: Gait  belt Activity Tolerance: Patient tolerated treatment well Patient left: in chair;with call bell/phone within reach     Time: 0935-0955 PT Time Calculation (min) (ACUTE ONLY): 20 min  Charges:  $Gait Training: 8-22 mins                    G Codes:      Douglas Christensen 06/30/2015, 10:12 AM Wray Kearns, [place], DPT [phone]

## 2015-06-30 NOTE — Progress Notes (Addendum)
CSW (Clinical Education officer, museum) aware of consult. At this time, PT is recommending Douglas Christensen services. Pt has no social work needs. Please reconsult should needs arise.   ADDENDUM: CSW aware that PT recommendation has changed this morning. CSW will assist with dc planning and complete assessment with pt/family.  [place], [place]

## 2015-06-30 NOTE — Clinical Social Work Placement (Signed)
   CLINICAL SOCIAL WORK PLACEMENT  NOTE  Date:  06/30/2015  Patient Details  Name: Douglas Christensen MRN: [id number] Date of Birth: [birth year]  Clinical Social Work is seeking post-discharge placement for this patient at the [place] level of care (*CSW will initial, date and re-position this form in  chart as items are completed):  Yes   Patient/family provided with [facility] Work Department's list of facilities offering this level of care within the geographic area requested by the patient (or if unable, by the patient's family).  Yes   Patient/family informed of their freedom to choose among providers that offer the needed level of care, that participate in Medicare, Medicaid or managed care program needed by the patient, have an available bed and are willing to accept the patient.  Yes   Patient/family informed of [facility]'s ownership interest in [facility] and [facility], as well as of the fact that they are under no obligation to receive care at these facilities.  PASRR submitted to EDS on 06/30/15     PASRR number received on 06/30/15     Existing PASRR number confirmed on       FL2 transmitted to all facilities in geographic area requested by pt/family on 06/30/15     FL2 transmitted to all facilities within larger geographic area on       Patient informed that his/her managed care company has contracts with or will negotiate with certain facilities, including the following:            Patient/family informed of bed offers received.  Patient chooses bed at       Physician recommends and patient chooses bed at      Patient to be transferred to   on  .  Patient to be transferred to facility by       Patient family notified on   of transfer.  Name of family member notified:        PHYSICIAN Please prepare priority discharge summary, including medications, Please sign FL2, Please prepare prescriptions     Additional Comment:     _______________________________________________ [place], [place]

## 2015-06-30 NOTE — Evaluation (Addendum)
Occupational Therapy Evaluation Patient Details Name: Douglas Christensen MRN: [id number] DOB: 02-08-1951 Today's Date: 06/30/2015    History of Present Illness Patient is a 64 y/o male s/p L THA, posterior approach. PMH includes COPD, CAD, HTN, PVD, sleep apnea.   Clinical Impression   Patient presenting with decreased ADL & IADL independence secondary to above. Patient independent PTA. Patient currently requires min assist for functional mobility and up to mod assist for LB ADLs secondary to posterior hip precautions. Patient will benefit from acute OT to increase overall independence in the areas of ADLs, functional mobility, and overall safety in order to safely discharge to venue listed below.     Follow Up Recommendations  SNF;Supervision/Assistance - 24 hour    Equipment Recommendations  Other (comment) (TBD next venue of care)    Recommendations for Other Services  None at this time   Precautions / Restrictions Precautions Precautions: Posterior Hip Precaution Comments: Reviewed precautions. Restrictions Weight Bearing Restrictions: Yes LLE Weight Bearing: Weight bearing as tolerated      Mobility Bed Mobility Overal bed mobility: Needs Assistance Bed Mobility: Rolling;Supine to Sit Rolling: Min assist   Supine to sit: Min assist     General bed mobility comments: Assistance needed with trunk support to sitting and squaring up hips EOB.   Transfers Overall transfer level: Needs assistance Equipment used: Rolling walker (2 wheeled) Transfers: Sit to/from Stand Sit to Stand: Min assist General transfer comment: Cues for hand placement and sequencing.     Balance Overall balance assessment: Needs assistance Sitting-balance support: No upper extremity supported;Feet supported Sitting balance-Leahy Scale: Good     Standing balance support: Bilateral upper extremity supported;During functional activity Standing balance-Leahy Scale: Fair    ADL Overall ADL's : Needs  assistance/impaired Eating/Feeding: Set up;Sitting   Grooming: Set up;Sitting   Upper Body Bathing: Set up;Sitting   Lower Body Bathing: Moderate assistance;Sit to/from stand Lower Body Bathing Details (indicate cue type and reason): needs AE to increase independence  Upper Body Dressing : Set up;Sitting   Lower Body Dressing: Moderate assistance;Sit to/from stand Lower Body Dressing Details (indicate cue type and reason): needs AE to increase independence  Toilet Transfer: Moderate assistance;Cueing for safety General ADL Comments: Pt min assist with sit<>stands and functional mobility, and requires up to mod assist for LB ADLs due to posterior hip precautions.     Pertinent Vitals/Pain Pain Assessment: No/denies pain (states "0", but also states his leg is "sore") Pain Score: 0-No pain     Hand Dominance Right   Extremity/Trunk Assessment Upper Extremity Assessment Upper Extremity Assessment: Overall WFL for tasks assessed   Lower Extremity Assessment Lower Extremity Assessment: Defer to PT evaluation   Cervical / Trunk Assessment Cervical / Trunk Assessment: Normal   Communication Communication Communication: No difficulties   Cognition Arousal/Alertness: Awake/alert Behavior During Therapy: WFL for tasks assessed/performed Overall Cognitive Status: Within Functional Limits for tasks assessed Memory: Decreased recall of precautions             Home Living Family/patient expects to be discharged to:: Private residence Living Arrangements: Alone Available Help at Discharge: Family;Friend(s);Available PRN/intermittently Type of Home: House Home Access: Stairs to enter [employer] of Steps: 4 Entrance Stairs-Rails: Right Home Layout: One level     Bathroom Shower/Tub: Tub/shower unit;Curtain   Biochemist, clinical: Standard     Home Equipment: Environmental consultant - 2 wheels;Cane - single point   Additional Comments: pt's bathroom will not fit 3N1 over toilet due  to proximity of tub.  Prior Functioning/Environment Level of Independence: Independent with assistive device(s)  Comments: Pt using RW vs SPC PTA.     OT Diagnosis: Generalized weakness;Acute pain   OT Problem List: Decreased strength;Decreased activity tolerance;Decreased range of motion;Impaired balance (sitting and/or standing);Decreased knowledge of use of DME or AE;Decreased knowledge of precautions;Decreased safety awareness;Pain   OT Treatment/Interventions: Self-care/ADL training;Therapeutic exercise;Energy conservation;DME and/or AE instruction;Therapeutic activities;Patient/family education;Balance training    OT Goals(Current goals can be found in the care plan section) Acute Rehab OT Goals Patient Stated Goal: go to rehab before going home  OT Goal Formulation: With patient Time For Goal Achievement: 07/14/15 Potential to Achieve Goals: Good ADL Goals Pt Will Perform Grooming: with modified independence;standing Pt Will Perform Lower Body Bathing: with modified independence;sit to/from stand;with adaptive equipment Pt Will Perform Lower Body Dressing: with modified independence;with adaptive equipment;sit to/from stand Pt Will Transfer to Toilet: with modified independence;ambulating;bedside commode Additional ADL Goal #1: Pt will independently verbalize and adhere to 3/3 posterior hip precautions  OT Frequency: Min 2X/week   Barriers to D/C: Decreased caregiver support   End of Session Equipment Utilized During Treatment: Gait belt;Rolling walker  Activity Tolerance: Patient tolerated treatment well Patient left: in chair;with call bell/phone within reach   Time: [id number] OT Time Calculation (min): 23 min Charges:  OT General Charges $OT Visit: 1 Procedure OT Evaluation $Initial OT Evaluation Tier I: 1 Procedure OT Treatments $Self Care/Home Management : 8-22 mins  Brunella Wileman , MS, OTR/L, CLT Pager: [phone]  06/30/2015, 9:04 AM

## 2015-06-30 NOTE — Progress Notes (Signed)
Patient had difficulty voiding.  Opted not to be bladder scanned at 2100, due to voiding small amounts 50 - 100 cc. Patient reports some anxiety about having difficulty urinating.   Patient re-educated on bladder scan and intermittent cath.  Bladder scan revealed 667 cc at 0330, obtained 1000 cc of urine.  Patient urged to continue to try to urinate; fluids encouraged.  Will continue to monitor.

## 2015-06-30 NOTE — Progress Notes (Signed)
While in the hospital patient CPAP settings were auto titrate 5-15 cmH2O. Patient could not remember his home setting.

## 2015-06-30 NOTE — Progress Notes (Signed)
RT placed patient on CPAP. Patient setting is auto 5-15 cmH2O. Sterile water added to water chamber for humidification. Patient is tolerating well. RT will continue to monitor.

## 2015-06-30 NOTE — Anesthesia Postprocedure Evaluation (Signed)
Anesthesia Post Note  Patient: Douglas Christensen  Procedure(s) Performed: Procedure(s) (LRB): TOTAL HIP ARTHROPLASTY (Left)  Anesthesia type: General  Patient location: PACU  Post pain: Pain level controlled  Post assessment: Post-op Vital signs reviewed  Last Vitals: BP 132/73 mmHg  Pulse 86  Temp(Src) 36.5 C (Axillary)  Resp 18  Ht 5\' 5"  (1.651 m)  Wt 180 lb (81.647 kg)  BMI 29.95 kg/m2  SpO2 91%  Post vital signs: Reviewed  Level of consciousness: sedated  Complications: No apparent anesthesia complications

## 2015-06-30 NOTE — Clinical Social Work Note (Signed)
Clinical Social Work Assessment  Patient Details  Name: Douglas Christensen MRN: [id number] Date of Birth: January 10, 1951  Date of referral:  06/30/15               Reason for consult:  Facility Placement                Permission sought to share information with:  Chartered certified accountant granted to share information::  Yes, Verbal Permission Granted  Name::        Agency::  [facility] SNFs for referral purposes  Relationship::     Contact Information:     Housing/Transportation Living arrangements for the past 2 months:  [place] of Information:  Patient Patient Interpreter Needed:  None Criminal Activity/Legal Involvement Pertinent to Current Situation/Hospitalization:  No - Comment as needed Significant Relationships:  Adult Children Lives with:  Self Do you feel safe going back to the place where you live?  No Need for family participation in patient care:  No (Coment)  Care giving concerns:  Pt requiring assistance with mobility and PT recommending SNF   Social Worker assessment / plan:  CSW visited pt room to discuss recommendation. Pt aware of recommendation and agreeable to SNF. CSW explained SNF referral process as well as insurance authorization requirement. Pt agreeable to referral being sent to all [name]. Pt would like [facility] if possible. CSW did explain possibility of having to dc to LOG facility if we are unable to receive authorization and pt is medically stable for dc. Pt not happy with this idea but understanding. [name] and notified of referral. CSW asked that if facility is able to offer a bed they begin insurance authorization today.  Employment status:    Forensic scientist:  Managed Care PT Recommendations:  [place] / Referral to community resources:  [place]  Patient/Family's Response to care:  Pt is agreeable to ST rehab at  [name]  Patient/Family's Understanding of and Emotional Response to Diagnosis, Current Treatment, and Prognosis:  Pt has a good understanding of medical condition. Pt emotional response appropriate to current condition. Pt unhappy with possibility that insurance authorization is not received for him to dc to his preferred facility.  Emotional Assessment Appearance:  Appears stated age, Well-Groomed Attitude/Demeanor/Rapport:  Other (Cooperative) Affect (typically observed):  Calm, Pleasant, Appropriate Orientation:  Oriented to Self, Oriented to Place, Oriented to  Time, Oriented to Situation Alcohol / Substance use:  Not Applicable Psych involvement (Current and /or in the community):  No (Comment)  Discharge Needs  Concerns to be addressed:  Discharge Planning Concerns Readmission within the last 30 days:  No Current discharge risk:  Dependent with Mobility Barriers to Discharge:  Continued Medical Work up   [employer], [place]

## 2015-07-01 LAB — CBC
HCT: 38 % — ABNORMAL LOW (ref 39.0–52.0)
HEMOGLOBIN: 13.3 g/dL (ref 13.0–17.0)
MCH: 33.9 pg (ref 26.0–34.0)
MCHC: 35 g/dL (ref 30.0–36.0)
MCV: 96.9 fL (ref 78.0–100.0)
Platelets: 306 10*3/uL (ref 150–400)
RBC: 3.92 MIL/uL — ABNORMAL LOW (ref 4.22–5.81)
RDW: 13.5 % (ref 11.5–15.5)
WBC: 13.7 10*3/uL — ABNORMAL HIGH (ref 4.0–10.5)

## 2015-07-01 NOTE — Discharge Summary (Signed)
Patient ID: Douglas Christensen MRN: [id number] DOB/AGE: [birth year] 64 y.o.  Admit date: 06/29/2015 Discharge date: 07/01/2015  Admission Diagnoses:  Principal Problem:   Avascular necrosis of bone of left hip Active Problems:   Arthritis, hip   Discharge Diagnoses:  Same  Past Medical History  Diagnosis Date  . History of colonic polyps   . COPD (chronic obstructive pulmonary disease)   . CAD (coronary artery disease)     Mild plaque (cath "years ago"); abnormal Myoview 04/2013 with subsequent CABG x 5 with LIMA to LAD, SVG to OM1, SVG to DX, SVG to PD & PL.   Marland Kitchen GERD (gastroesophageal reflux disease)   . Hyperlipidemia   . Hypertension   . Vitamin D deficiency   . Meralgia paresthetica of left side 2011  . LBP (low back pain)   . Hx of echocardiogram     Echo (9/15):  EF 55-60%; Gr 2 DD, mild BAE  . Anxiety   . PVD (peripheral vascular disease)     Stent to left common femoral and right superficial femoral.  2008.  50%  left renal   . 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 Shortness of breath     "once in awhile; can happen at anytime" (08/26/2013)  . Arthritis     Surgeries: Procedure(s): TOTAL HIP ARTHROPLASTY on 06/29/2015   Consultants:    Discharged Condition: Improved  Hospital Course: Douglas Christensen is an 64 y.o. male who was admitted 06/29/2015 for operative treatment ofAvascular necrosis of bone of left hip. Patient has severe unremitting pain that affects sleep, daily activities, and work/hobbies. After pre-op clearance the patient was taken to the operating room on 06/29/2015 and underwent  Procedure(s): TOTAL HIP ARTHROPLASTY.    Patient was given perioperative antibiotics: Anti-infectives    Start     Dose/Rate Route Frequency Ordered Stop   06/29/15 0800  ceFAZolin (ANCEF) IVPB 2 g/50 mL premix     2 g 100 mL/hr over 30 Minutes Intravenous To [facility] 06/26/15 1045 06/29/15 1025       Patient was  given sequential compression devices, early ambulation, and chemoprophylaxis to prevent DVT.  Patient benefited maximally from hospital stay and there were no complications.    Recent vital signs: Patient Vitals for the past 24 hrs:  BP Temp Temp src Pulse Resp SpO2  07/01/15 0618 140/74 mmHg 97.8 F (36.6 C) Oral 82 16 96 %  06/30/15 2317 127/62 mmHg 97.7 F (36.5 C) Axillary 67 16 97 %  06/30/15 1100 132/73 mmHg 97.7 F (36.5 C) Axillary 86 18 91 %     Recent laboratory studies:  Recent Labs  06/30/15 0435 07/01/15 0535  WBC 9.0 13.7*  HGB 13.1 13.3  HCT 37.8* 38.0*  PLT 278 306  NA 133*  --   K 3.6  --   CL 101  --   CO2 25  --   BUN 7  --   CREATININE 0.80  --   GLUCOSE 143*  --   CALCIUM 8.3*  --      Discharge Medications:     Medication List    TAKE these medications        ALPRAZolam 0.25 MG tablet  Commonly known as:  XANAX  Take 1 tablet (0.25 mg total) by mouth 2 (two) times daily as needed for anxiety.     amLODipine 10 MG tablet  Commonly known as:  NORVASC  TAKE 1/2 TABLET BY MOUTH EVERY DAY     aspirin EC 325 MG tablet  Take 1 tablet (325 mg total) by mouth 2 (two) times daily.     atenolol 50 MG tablet  Commonly known as:  TENORMIN  TAKE 1 TABLET BY MOUTH EVERY DAY     atorvastatin 20 MG tablet  Commonly known as:  LIPITOR  TAKE 1 TABLET BY MOUTH EVERY DAY     cholecalciferol 1000 UNITS tablet  Commonly known as:  VITAMIN D  Take 1,000 Units by mouth daily.     diazepam 5 MG tablet  Commonly known as:  VALIUM  Take 1 tablet (5 mg total) by mouth every 6 (six) hours as needed for anxiety.     gabapentin 100 MG capsule  Commonly known as:  NEURONTIN  TAKE 1 CAPSULE(100 MG) BY MOUTH AT BEDTIME     hydrALAZINE 50 MG tablet  Commonly known as:  APRESOLINE  TAKE 1 TABLET BY MOUTH THREE TIMES DAILY     HYDROmorphone 2 MG tablet  Commonly known as:  DILAUDID  Take 1 tablet (2 mg total) by mouth every 4 (four) hours as needed for  severe pain.     hydrOXYzine 10 MG tablet  Commonly known as:  ATARAX/VISTARIL  Take 1 tablet (10 mg total) by mouth 3 (three) times daily as needed.     methocarbamol 500 MG tablet  Commonly known as:  ROBAXIN  Take 1 tablet (500 mg total) by mouth 2 (two) times daily with a meal.     naproxen sodium 220 MG tablet  Commonly known as:  ANAPROX  Take 440 mg by mouth as needed (for pain).     traMADol 50 MG tablet  Commonly known as:  ULTRAM  Take 2 tablets (100 mg total) by mouth 3 (three) times daily as needed.     vitamin B-12 1000 MCG tablet  Commonly known as:  CYANOCOBALAMIN  Take 1,000 mcg by mouth daily.        Diagnostic Studies: Dg Pelvis Portable  06/29/2015   CLINICAL DATA:  Arthritis.  History of total arthroplasty.  EXAM: PORTABLE PELVIS 1-2 VIEWS  COMPARISON:  05/18/2015  FINDINGS: The patient has undergone left hip hemiarthroplasty. The hardware appears well seated. No evidence for dislocation on the portable frontal view. Bilateral femoral stents. No acute fracture.  IMPRESSION: Status post hemi arthroplasty.  No adverse features.   Electronically Signed   By: Nolon Nations M.D.   On: 06/29/2015 13:14    Disposition: 01-Home or Self Care      Discharge Instructions    Call MD / Call 911    Complete by:  As directed   If you experience chest pain or shortness of breath, CALL 911 and be transported to the hospital emergency room.  If you develope a fever above 101 F, pus (white drainage) or increased drainage or redness at the wound, or calf pain, call your surgeon's office.     Change dressing    Complete by:  As directed   You may change your dressing on day 5, then change the dressing daily with sterile 4 x 4 inch gauze dressing and paper tape.  You may clean the incision with alcohol prior to redressing     Constipation Prevention    Complete by:  As directed   Drink plenty of fluids.  Prune juice may be helpful.  You may use a stool softener, such as  Colace (over  the counter) 100 mg twice a day.  Use MiraLax (over the counter) for constipation as needed.     Diet - low sodium heart healthy    Complete by:  As directed      Discharge instructions    Complete by:  As directed   Follow up in office with Dr. Mayer Camel in 2 weeks.     Driving restrictions    Complete by:  As directed   No driving for 2 weeks     Follow the hip precautions as taught in Physical Therapy    Complete by:  As directed      Increase activity slowly as tolerated    Complete by:  As directed      Patient may shower    Complete by:  As directed   You may shower without a dressing once there is no drainage.  Do not wash over the wound.  If drainage remains, cover wound with plastic wrap and then shower.           Follow-up Information    Follow up with [name], MD In 2 weeks.   Specialty:  Orthopedic Surgery   Contact information:   [place] [address] [phone]        Signed: Hardin Negus Naveen Clardy R 07/01/2015, 8:20 AM

## 2015-07-01 NOTE — Care Management Note (Signed)
Case Management Note  Patient Details  Name: Douglas Christensen MRN: [id number] Date of Birth: 17-Dec-1950  Subjective/Objective:    64 yr old male s/p left total hp arthroplasty                Action/Plan:  Case manager spoke with patient concerning home health and DME needs at discharge. Choice was offered. Patient request therapist named amy Angrove with [facility]. Case manager called Miranda, [facility] Liaison with referral and also gave her patient's request. Patient states he has a rolling walker, 3in1 and a cane. States his friends will assist him at discharge.     Expected Discharge Date:  07/01/15               Expected Discharge Plan:     In-House Referral:  NA  Discharge planning Services  CM Consult  Post Acute Care Choice:  Home Health Choice offered to:  Patient  DME Arranged:  N/A DME Agency:     HH Arranged:  PT [place] Agency:  [facility]  Status of Service:  Completed, signed off  Medicare Important Message Given:    Date Medicare IM Given:    Medicare IM give by:    Date Additional Medicare IM Given:    Additional Medicare Important Message give by:     If discussed at [place] of Stay Meetings, dates discussed:    Additional Comments:  Ninfa Meeker, RN 07/01/2015, 11:01 AM

## 2015-07-01 NOTE — Progress Notes (Signed)
PATIENT ID: Douglas Christensen  MRN: [id number]  DOB/AGE:  [birth year] / 64 y.o.  2 Days Post-Op Procedure(s) (LRB): TOTAL HIP ARTHROPLASTY (Left)    PROGRESS NOTE Subjective: Patient is alert, oriented, no Nausea, no Vomiting, yes passing gas, no Bowel Movement. Taking PO well. Denies SOB, Chest or Calf Pain. Using Incentive Spirometer, PAS in place. Ambulate WBAT with pt walking 100 ft. Patient reports pain as mild  .    Objective: Vital signs in last 24 hours: Filed Vitals:   06/30/15 0458 06/30/15 1100 06/30/15 2317 07/01/15 0618  BP: 111/63 132/73 127/62 140/74  Pulse: 89 86 67 82  Temp: 99.1 F (37.3 C) 97.7 F (36.5 C) 97.7 F (36.5 C) 97.8 F (36.6 C)  TempSrc: Axillary Axillary Axillary Oral  Resp: 18 18 16 16   Height:      Weight:      SpO2: 88% 91% 97% 96%      Intake/Output from previous day: I/O last 3 completed shifts: In: 2575 [P.O.:1200; I.V.:1375] Out: 3001 [Urine:3000; Stool:1]   Intake/Output this shift:     LABORATORY DATA:  Recent Labs  06/30/15 0435 07/01/15 0535  WBC 9.0 13.7*  HGB 13.1 13.3  HCT 37.8* 38.0*  PLT 278 306  NA 133*  --   K 3.6  --   CL 101  --   CO2 25  --   BUN 7  --   CREATININE 0.80  --   GLUCOSE 143*  --   CALCIUM 8.3*  --     Examination: Neurologically intact Neurovascular intact Sensation intact distally Intact pulses distally Dorsiflexion/Plantar flexion intact Incision: dressing C/D/I No cellulitis present Compartment soft} XR AP&Lat of hip shows well placed\fixed THA  Assessment:   2 Days Post-Op Procedure(s) (LRB): TOTAL HIP ARTHROPLASTY (Left) ADDITIONAL DIAGNOSIS:  Expected Acute Blood Loss Anemia, Hypertension and CAD, PVD  Plan: PT/OT WBAT, THA  posterior precautions  DVT Prophylaxis: SCDx72 hrs, ASA 325 mg BID x 2 weeks  DISCHARGE PLAN: Skilled Nursing Facility/Rehab  DISCHARGE NEEDS: HHPT, HHRN, Walker and 3-in-1 comode seat

## 2015-07-01 NOTE — Progress Notes (Signed)
Physical Therapy Treatment Patient Details Name: Douglas Christensen MRN: [id number] DOB: 09-07-1951 Today's Date: 07/01/2015    History of Present Illness Patient is a 64 y/o male s/p L THA, posterior approach. PMH includes COPD, CAD, HTN, PVD, sleep apnea.    PT Comments    Pt making excellent progress with all aspects of therapy and is mod I with use of RW and S for stairs.  Pt able to recall 2/3 precautions, however has sign in room and is aware of them functionally.  Discussed need for SNF placement and PT feels that pt is too high level for SNF and recommend HHPT for follow up.  CSW notified.    Follow Up Recommendations  Home health PT     Equipment Recommendations  None recommended by PT    Recommendations for Other Services       Precautions / Restrictions Precautions Precautions: Posterior Hip Precaution Booklet Issued: Yes (comment) Precaution Comments: Reviewed precautions. Pt only able to recall 2/3 Restrictions Weight Bearing Restrictions: Yes LLE Weight Bearing: Weight bearing as tolerated    Mobility  Bed Mobility Overal bed mobility: Modified Independent                Transfers Overall transfer level: Modified independent Equipment used: Rolling walker (2 wheeled) Transfers: Sit to/from Stand Sit to Stand: Modified independent (Device/Increase time)            Ambulation/Gait Ambulation/Gait assistance: Modified independent (Device/Increase time) Ambulation Distance (Feet): 150 Feet Assistive device: Rolling walker (2 wheeled) Gait Pattern/deviations: Step-through pattern;Decreased stride length         Stairs Stairs: Yes Stairs assistance: Supervision Stair Management: One rail Right;With cane;Step to pattern;Forwards Number of Stairs: 4 General stair comments: Pt with good recall of stair sequence with use of cane.   Wheelchair Mobility    Modified Rankin (Stroke Patients Only)       Balance Overall balance assessment: Modified  Independent                                  Cognition Arousal/Alertness: Awake/alert Behavior During Therapy: WFL for tasks assessed/performed Overall Cognitive Status: Within Functional Limits for tasks assessed       Memory: Decreased recall of precautions              Exercises Total Joint Exercises Ankle Circles/Pumps: Both;10 reps;Seated Gluteal Sets: Both;10 reps;Seated Heel Slides: Strengthening;Left;10 reps;Seated (within precaution limits) Hip ABduction/ADduction: Left;AAROM;10 reps;Seated Long Arc Quad: Left;10 reps;Seated    General Comments        Pertinent Vitals/Pain Pain Assessment: No/denies pain    Home Living                      Prior Function            PT Goals (current goals can now be found in the care plan section) Acute Rehab PT Goals Patient Stated Goal: to get back to walking pain free PT Goal Formulation: With patient Time For Goal Achievement: 07/13/15 Potential to Achieve Goals: Good Progress towards PT goals: Progressing toward goals    Frequency  7X/week    PT Plan Discharge plan needs to be updated    Co-evaluation             End of Session   Activity Tolerance: Patient tolerated treatment well Patient left: in chair;with call bell/phone within reach     Time:  [id number] PT Time Calculation (min) (ACUTE ONLY): 19 min  Charges:  $Gait Training: 8-22 mins                    G Codes:      Douglas Christensen 07/01/2015, 9:50 AM

## 2015-07-13 ENCOUNTER — Telehealth: Payer: Self-pay | Admitting: Pulmonary Disease

## 2015-07-13 DIAGNOSIS — G4733 Obstructive sleep apnea (adult) (pediatric): Secondary | ICD-10-CM

## 2015-07-13 NOTE — Telephone Encounter (Signed)
Called [facility] and LMTCB x1 to see if she can pull order from epic

## 2015-07-15 NOTE — Telephone Encounter (Signed)
lmtcb for [employer].

## 2015-07-16 NOTE — Telephone Encounter (Signed)
lmtcb x2 for [employer].

## 2015-07-16 NOTE — Telephone Encounter (Signed)
Geralyn called back. Needs new order for pt CPAP Mask and filter. Order placed. Nothing further needed

## 2015-07-22 ENCOUNTER — Telehealth: Payer: Self-pay | Admitting: Internal Medicine

## 2015-07-22 ENCOUNTER — Encounter: Payer: Self-pay | Admitting: Internal Medicine

## 2015-07-22 ENCOUNTER — Ambulatory Visit (INDEPENDENT_AMBULATORY_CARE_PROVIDER_SITE_OTHER): Payer: BLUE CROSS/BLUE SHIELD | Admitting: Internal Medicine

## 2015-07-22 VITALS — BP 148/70 | HR 65 | Temp 98.3°F | Wt 180.0 lb

## 2015-07-22 DIAGNOSIS — I119 Hypertensive heart disease without heart failure: Secondary | ICD-10-CM

## 2015-07-22 DIAGNOSIS — L709 Acne, unspecified: Secondary | ICD-10-CM | POA: Diagnosis not present

## 2015-07-22 DIAGNOSIS — E538 Deficiency of other specified B group vitamins: Secondary | ICD-10-CM

## 2015-07-22 DIAGNOSIS — M5440 Lumbago with sciatica, unspecified side: Secondary | ICD-10-CM

## 2015-07-22 DIAGNOSIS — Z23 Encounter for immunization: Secondary | ICD-10-CM | POA: Diagnosis not present

## 2015-07-22 DIAGNOSIS — I2510 Atherosclerotic heart disease of native coronary artery without angina pectoris: Secondary | ICD-10-CM

## 2015-07-22 DIAGNOSIS — R739 Hyperglycemia, unspecified: Secondary | ICD-10-CM

## 2015-07-22 DIAGNOSIS — M25552 Pain in left hip: Secondary | ICD-10-CM

## 2015-07-22 MED ORDER — CYANOCOBALAMIN 1000 MCG/ML IJ SOLN
1000.0000 ug | Freq: Once | INTRAMUSCULAR | Status: AC
  Administered 2015-07-22: 1000 ug via INTRAMUSCULAR

## 2015-07-22 MED ORDER — DOXYCYCLINE HYCLATE 100 MG PO TABS: 100.0000 mg | ORAL_TABLET | Freq: Two times a day (BID) | ORAL | Status: DC

## 2015-07-22 MED ORDER — METRONIDAZOLE 0.75 % EX LOTN: TOPICAL_LOTION | CUTANEOUS | Status: DC

## 2015-07-22 NOTE — Assessment & Plan Note (Signed)
8/16 x3 mo ?etiol ?hydralazine related  Metrogel on face Doxy po

## 2015-07-22 NOTE — Assessment & Plan Note (Signed)
On Vit B12 

## 2015-07-22 NOTE — Progress Notes (Signed)
Pre visit review using our clinic review tool, if applicable. No additional management support is needed unless otherwise documented below in the visit note. 

## 2015-07-22 NOTE — Telephone Encounter (Signed)
Can you please call pt.  He was just in here and wanted to talk with you.  He says he owes you a big thank you.

## 2015-07-22 NOTE — Assessment & Plan Note (Signed)
Resolved after L THR

## 2015-07-22 NOTE — Telephone Encounter (Signed)
lmovm for pt to return call.  

## 2015-07-22 NOTE — Assessment & Plan Note (Signed)
On Hydralazine °

## 2015-07-22 NOTE — Assessment & Plan Note (Signed)
Plavix, Lipitor, ASA 

## 2015-07-22 NOTE — Progress Notes (Signed)
Subjective:  Patient ID: Douglas Christensen, male    DOB: 10/08/1951  Age: 64 y.o. MRN: [id number]  CC: No chief complaint on file.   HPI FRANCISO DINGUS presents for HTN, CAD, PVD. Pt had a LTHR - helped a lot. Pt had a LLE vasc bypass  Outpatient Prescriptions Prior to Visit  Medication Sig Dispense Refill  . ALPRAZolam (XANAX) 0.25 MG tablet Take 1 tablet (0.25 mg total) by mouth 2 (two) times daily as needed for anxiety. 60 tablet 5  . amLODipine (NORVASC) 10 MG tablet TAKE 1/2 TABLET BY MOUTH EVERY DAY (Patient taking differently: TAKE 5 MG BY MOUTH EVERY DAY) 90 tablet 0  . aspirin EC 325 MG tablet Take 1 tablet (325 mg total) by mouth 2 (two) times daily. 30 tablet 0  . atenolol (TENORMIN) 50 MG tablet TAKE 1 TABLET BY MOUTH EVERY DAY (Patient taking differently: TAKE 50 MG BY MOUTH EVERY DAY) 90 tablet 2  . atorvastatin (LIPITOR) 20 MG tablet TAKE 1 TABLET BY MOUTH EVERY DAY (Patient taking differently: TAKE 20 MG BY MOUTH EVERY DAY) 90 tablet 3  . cholecalciferol (VITAMIN D) 1000 UNITS tablet Take 1,000 Units by mouth daily.     . diazepam (VALIUM) 5 MG tablet Take 1 tablet (5 mg total) by mouth every 6 (six) hours as needed for anxiety. 15 tablet 0  . hydrALAZINE (APRESOLINE) 50 MG tablet TAKE 1 TABLET BY MOUTH THREE TIMES DAILY (Patient taking differently: TAKE 50 MG BY MOUTH THREE TIMES DAILY) 270 tablet 2  . HYDROmorphone (DILAUDID) 2 MG tablet Take 1 tablet (2 mg total) by mouth every 4 (four) hours as needed for severe pain. 60 tablet 0  . hydrOXYzine (ATARAX/VISTARIL) 10 MG tablet Take 1 tablet (10 mg total) by mouth 3 (three) times daily as needed. 30 tablet 0  . methocarbamol (ROBAXIN) 500 MG tablet Take 1 tablet (500 mg total) by mouth 2 (two) times daily with a meal. 60 tablet 0  . naproxen sodium (ANAPROX) 220 MG tablet Take 440 mg by mouth as needed (for pain).     . traMADol (ULTRAM) 50 MG tablet Take 2 tablets (100 mg total) by mouth 3 (three) times daily as needed. (Patient  taking differently: Take 50 mg by mouth 3 (three) times daily as needed for moderate pain. ) 180 tablet 0  . vitamin B-12 (CYANOCOBALAMIN) 1000 MCG tablet Take 1,000 mcg by mouth daily.    Marland Kitchen gabapentin (NEURONTIN) 100 MG capsule TAKE 1 CAPSULE(100 MG) BY MOUTH AT BEDTIME (Patient not taking: Reported on 07/22/2015) 30 capsule 0   No facility-administered medications prior to visit.    ROS Review of Systems  Constitutional: Positive for fatigue. Negative for appetite change and unexpected weight change.  HENT: Negative for congestion, nosebleeds, sneezing, sore throat and trouble swallowing.   Eyes: Negative for itching and visual disturbance.  Respiratory: Negative for cough.   Cardiovascular: Negative for chest pain, palpitations and leg swelling.  Gastrointestinal: Negative for nausea, diarrhea, blood in stool and abdominal distention.  Genitourinary: Negative for frequency and hematuria.  Musculoskeletal: Positive for back pain, arthralgias and gait problem. Negative for joint swelling and neck pain.  Skin: Positive for rash.  Neurological: Negative for dizziness, tremors, speech difficulty and weakness.  Psychiatric/Behavioral: Negative for suicidal ideas, sleep disturbance, dysphoric mood and agitation. The patient is not nervous/anxious.     Objective:  BP 148/70 mmHg  Pulse 65  Temp(Src) 98.3 F (36.8 C) (Oral)  Wt 180 lb (81.647  kg)  SpO2 97%  BP Readings from Last 3 Encounters:  07/22/15 148/70  07/01/15 140/74  06/24/15 140/70    Wt Readings from Last 3 Encounters:  07/22/15 180 lb (81.647 kg)  06/29/15 180 lb (81.647 kg)  06/24/15 180 lb (81.647 kg)    Physical Exam  Constitutional: He is oriented to person, place, and time. He appears well-developed. No distress.  NAD  HENT:  Mouth/Throat: Oropharynx is clear and moist.  Eyes: Conjunctivae are normal. Pupils are equal, round, and reactive to light.  Neck: Normal range of motion. No JVD present. No  thyromegaly present.  Cardiovascular: Normal rate, regular rhythm, normal heart sounds and intact distal pulses.  Exam reveals no gallop and no friction rub.   No murmur heard. Pulmonary/Chest: Effort normal and breath sounds normal. No respiratory distress. He has no wheezes. He has no rales. He exhibits no tenderness.  Abdominal: Soft. Bowel sounds are normal. He exhibits no distension and no mass. There is no tenderness. There is no rebound and no guarding.  Musculoskeletal: Normal range of motion. He exhibits tenderness. He exhibits no edema.  Lymphadenopathy:    He has no cervical adenopathy.  Neurological: He is alert and oriented to person, place, and time. He has normal reflexes. No cranial nerve deficit. He exhibits normal muscle tone. He displays a negative Romberg sign. Coordination abnormal. Gait normal.  Skin: Skin is warm and dry. Rash noted.  Psychiatric: He has a normal mood and affect. His behavior is normal. Judgment and thought content normal.  acne pustules on face Cane  Lab Results  Component Value Date   WBC 13.7* 07/01/2015   HGB 13.3 07/01/2015   HCT 38.0* 07/01/2015   PLT 306 07/01/2015   GLUCOSE 143* 06/30/2015   CHOL 207* 11/05/2014   TRIG 224.0* 11/05/2014   HDL 44.00 11/05/2014   LDLDIRECT 138.6 11/05/2014   LDLCALC 75 03/13/2014   ALT 74* 01/20/2015   AST 74* 01/20/2015   NA 133* 06/30/2015   K 3.6 06/30/2015   CL 101 06/30/2015   CREATININE 0.80 06/30/2015   BUN 7 06/30/2015   CO2 25 06/30/2015   TSH 3.78 11/05/2014   PSA 0.86 11/05/2014   INR 1.10 06/22/2015   HGBA1C 5.8* 05/03/2013    No results found.  Assessment & Plan:   There are no diagnoses linked to this encounter. I am having Mr. Hagin maintain his cholecalciferol, vitamin B-12, atorvastatin, hydrOXYzine, atenolol, amLODipine, ALPRAZolam, hydrALAZINE, naproxen sodium, traMADol, diazepam, gabapentin, methocarbamol, aspirin EC, and HYDROmorphone.  No orders of the defined types  were placed in this encounter.     Follow-up: No Follow-up on file.  [name], MD

## 2015-07-22 NOTE — Assessment & Plan Note (Signed)
Off Dilaudid

## 2015-07-22 NOTE — Addendum Note (Signed)
Addended by: Cresenciano Lick on: 07/22/2015 12:03 PM   Modules accepted: Orders

## 2015-07-28 ENCOUNTER — Other Ambulatory Visit: Payer: Self-pay | Admitting: Family Medicine

## 2015-07-28 NOTE — Telephone Encounter (Signed)
Pt had total hip replacement 4-6 weeks ago & is no longer taking this med.

## 2015-08-15 ENCOUNTER — Other Ambulatory Visit: Payer: Self-pay | Admitting: Internal Medicine

## 2015-09-01 ENCOUNTER — Other Ambulatory Visit (INDEPENDENT_AMBULATORY_CARE_PROVIDER_SITE_OTHER): Payer: BLUE CROSS/BLUE SHIELD

## 2015-09-01 ENCOUNTER — Ambulatory Visit (INDEPENDENT_AMBULATORY_CARE_PROVIDER_SITE_OTHER): Payer: BLUE CROSS/BLUE SHIELD | Admitting: Internal Medicine

## 2015-09-01 ENCOUNTER — Encounter: Payer: Self-pay | Admitting: Internal Medicine

## 2015-09-01 VITALS — BP 140/74 | HR 62 | Wt 186.0 lb

## 2015-09-01 DIAGNOSIS — R5382 Chronic fatigue, unspecified: Secondary | ICD-10-CM

## 2015-09-01 DIAGNOSIS — D751 Secondary polycythemia: Secondary | ICD-10-CM

## 2015-09-01 DIAGNOSIS — I70219 Atherosclerosis of native arteries of extremities with intermittent claudication, unspecified extremity: Secondary | ICD-10-CM

## 2015-09-01 DIAGNOSIS — G4733 Obstructive sleep apnea (adult) (pediatric): Secondary | ICD-10-CM

## 2015-09-01 DIAGNOSIS — F411 Generalized anxiety disorder: Secondary | ICD-10-CM

## 2015-09-01 DIAGNOSIS — G4731 Primary central sleep apnea: Secondary | ICD-10-CM

## 2015-09-01 DIAGNOSIS — I2510 Atherosclerotic heart disease of native coronary artery without angina pectoris: Secondary | ICD-10-CM

## 2015-09-01 DIAGNOSIS — E538 Deficiency of other specified B group vitamins: Secondary | ICD-10-CM | POA: Diagnosis not present

## 2015-09-01 DIAGNOSIS — Z0000 Encounter for general adult medical examination without abnormal findings: Secondary | ICD-10-CM

## 2015-09-01 DIAGNOSIS — E539 Vitamin B deficiency, unspecified: Secondary | ICD-10-CM

## 2015-09-01 DIAGNOSIS — G4737 Central sleep apnea in conditions classified elsewhere: Secondary | ICD-10-CM

## 2015-09-01 DIAGNOSIS — I10 Essential (primary) hypertension: Secondary | ICD-10-CM

## 2015-09-01 LAB — CBC WITH DIFFERENTIAL/PLATELET
BASOS ABS: 0 10*3/uL (ref 0.0–0.1)
BASOS PCT: 0.6 % (ref 0.0–3.0)
Eosinophils Absolute: 0.2 10*3/uL (ref 0.0–0.7)
Eosinophils Relative: 2.6 % (ref 0.0–5.0)
HCT: 48.6 % (ref 39.0–52.0)
Hemoglobin: 16.4 g/dL (ref 13.0–17.0)
LYMPHS ABS: 2 10*3/uL (ref 0.7–4.0)
Lymphocytes Relative: 24.6 % (ref 12.0–46.0)
MCHC: 33.8 g/dL (ref 30.0–36.0)
MCV: 101.4 fl — AB (ref 78.0–100.0)
MONOS PCT: 7.5 % (ref 3.0–12.0)
Monocytes Absolute: 0.6 10*3/uL (ref 0.1–1.0)
NEUTROS ABS: 5.3 10*3/uL (ref 1.4–7.7)
NEUTROS PCT: 64.7 % (ref 43.0–77.0)
PLATELETS: 241 10*3/uL (ref 150.0–400.0)
RBC: 4.79 Mil/uL (ref 4.22–5.81)
RDW: 14.8 % (ref 11.5–15.5)
WBC: 8.2 10*3/uL (ref 4.0–10.5)

## 2015-09-01 LAB — BASIC METABOLIC PANEL
BUN: 10 mg/dL (ref 6–23)
CALCIUM: 9.4 mg/dL (ref 8.4–10.5)
CHLORIDE: 108 meq/L (ref 96–112)
CO2: 23 mEq/L (ref 19–32)
CREATININE: 0.8 mg/dL (ref 0.40–1.50)
GFR: 103.28 mL/min (ref >60.00)
Glucose, Bld: 136 mg/dL — ABNORMAL HIGH (ref 70–99)
Potassium: 3.5 mEq/L (ref 3.5–5.1)
Sodium: 142 mEq/L (ref 135–145)

## 2015-09-01 LAB — VITAMIN B12: VITAMIN B 12: 913 pg/mL — AB (ref 211–911)

## 2015-09-01 LAB — HEPATIC FUNCTION PANEL
ALT: 36 U/L (ref 0–53)
AST: 43 U/L — ABNORMAL HIGH (ref 0–37)
Albumin: 3.9 g/dL (ref 3.5–5.2)
Alkaline Phosphatase: 74 U/L (ref 39–117)
BILIRUBIN DIRECT: 0.1 mg/dL (ref 0.0–0.3)
BILIRUBIN TOTAL: 0.5 mg/dL (ref 0.2–1.2)
Total Protein: 6.9 g/dL (ref 6.0–8.3)

## 2015-09-01 LAB — TSH: TSH: 2.46 u[IU]/mL (ref 0.35–4.50)

## 2015-09-01 NOTE — Assessment & Plan Note (Signed)
Check B12 

## 2015-09-01 NOTE — Assessment & Plan Note (Signed)
Worse x2 wks labs

## 2015-09-01 NOTE — Progress Notes (Signed)
Pre visit review using our clinic review tool, if applicable. No additional management support is needed unless otherwise documented below in the visit note. 

## 2015-09-01 NOTE — Progress Notes (Signed)
Subjective:  Patient ID: Douglas Christensen, male    DOB: 1951/07/01  Age: 64 y.o. MRN: [id number]  CC: No chief complaint on file.   HPI Douglas Christensen presents for CAD, HTN, dyslipidemia. C/o sleeping too much x 2 weeks. On CPAP at night.  Outpatient Prescriptions Prior to Visit  Medication Sig Dispense Refill  . ALPRAZolam (XANAX) 0.25 MG tablet Take 1 tablet (0.25 mg total) by mouth 2 (two) times daily as needed for anxiety. 60 tablet 5  . amLODipine (NORVASC) 10 MG tablet TAKE 0.5 TABLET BY MOUTH EVERY DAY 90 tablet 1  . aspirin EC 325 MG tablet Take 1 tablet (325 mg total) by mouth 2 (two) times daily. 30 tablet 0  . atenolol (TENORMIN) 50 MG tablet TAKE 1 TABLET BY MOUTH EVERY DAY (Patient taking differently: TAKE 50 MG BY MOUTH EVERY DAY) 90 tablet 2  . atorvastatin (LIPITOR) 20 MG tablet TAKE 1 TABLET BY MOUTH EVERY DAY (Patient taking differently: TAKE 20 MG BY MOUTH EVERY DAY) 90 tablet 3  . cholecalciferol (VITAMIN D) 1000 UNITS tablet Take 1,000 Units by mouth daily.     . hydrALAZINE (APRESOLINE) 50 MG tablet TAKE 1 TABLET BY MOUTH THREE TIMES DAILY (Patient taking differently: TAKE 50 MG BY MOUTH THREE TIMES DAILY) 270 tablet 2  . METRONIDAZOLE, TOPICAL, 0.75 % LOTN On face qd 59 mL 3  . naproxen sodium (ANAPROX) 220 MG tablet Take 440 mg by mouth as needed (for pain).     . traMADol (ULTRAM) 50 MG tablet Take 2 tablets (100 mg total) by mouth 3 (three) times daily as needed. (Patient taking differently: Take 50 mg by mouth 3 (three) times daily as needed for moderate pain. ) 180 tablet 0  . vitamin B-12 (CYANOCOBALAMIN) 1000 MCG tablet Take 1,000 mcg by mouth daily.    Marland Kitchen doxycycline (VIBRA-TABS) 100 MG tablet Take 1 tablet (100 mg total) by mouth 2 (two) times daily. (Patient not taking: Reported on 09/01/2015) 60 tablet 1   No facility-administered medications prior to visit.    ROS Review of Systems  Constitutional: Positive for fatigue and unexpected weight change. Negative for  appetite change.  HENT: Negative for congestion, nosebleeds, sneezing, sore throat and trouble swallowing.   Eyes: Negative for itching and visual disturbance.  Respiratory: Negative for cough and chest tightness.   Cardiovascular: Negative for chest pain, palpitations and leg swelling.  Gastrointestinal: Negative for nausea, diarrhea, blood in stool and abdominal distention.  Genitourinary: Negative for frequency and hematuria.  Musculoskeletal: Negative for back pain, joint swelling, gait problem and neck pain.  Skin: Negative for rash.  Neurological: Negative for dizziness, tremors, speech difficulty and weakness.  Psychiatric/Behavioral: Negative for suicidal ideas, sleep disturbance, dysphoric mood and agitation. The patient is not nervous/anxious.     Objective:  BP 140/74 mmHg  Pulse 62  Wt 186 lb (84.369 kg)  SpO2 97%  BP Readings from Last 3 Encounters:  09/01/15 140/74  07/22/15 148/70  07/01/15 140/74    Wt Readings from Last 3 Encounters:  09/01/15 186 lb (84.369 kg)  07/22/15 180 lb (81.647 kg)  06/29/15 180 lb (81.647 kg)    Physical Exam  Constitutional: He is oriented to person, place, and time. He appears well-developed. No distress.  NAD  HENT:  Mouth/Throat: Oropharynx is clear and moist.  Eyes: Conjunctivae are normal. Pupils are equal, round, and reactive to light.  Neck: Normal range of motion. No JVD present. No thyromegaly present.  Cardiovascular: Normal  rate, regular rhythm, normal heart sounds and intact distal pulses.  Exam reveals no gallop and no friction rub.   No murmur heard. Pulmonary/Chest: Effort normal and breath sounds normal. No respiratory distress. He has no wheezes. He has no rales. He exhibits no tenderness.  Abdominal: Soft. Bowel sounds are normal. He exhibits no distension and no mass. There is no tenderness. There is no rebound and no guarding.  Musculoskeletal: Normal range of motion. He exhibits no edema or tenderness.    Lymphadenopathy:    He has no cervical adenopathy.  Neurological: He is alert and oriented to person, place, and time. He has normal reflexes. No cranial nerve deficit. He exhibits normal muscle tone. He displays a negative Romberg sign. Coordination and gait normal.  Skin: Skin is warm and dry. No rash noted.  Psychiatric: He has a normal mood and affect. His behavior is normal. Judgment and thought content normal.  Obese  Lab Results  Component Value Date   WBC 13.7* 07/01/2015   HGB 13.3 07/01/2015   HCT 38.0* 07/01/2015   PLT 306 07/01/2015   GLUCOSE 143* 06/30/2015   CHOL 207* 11/05/2014   TRIG 224.0* 11/05/2014   HDL 44.00 11/05/2014   LDLDIRECT 138.6 11/05/2014   LDLCALC 75 03/13/2014   ALT 74* 01/20/2015   AST 74* 01/20/2015   NA 133* 06/30/2015   K 3.6 06/30/2015   CL 101 06/30/2015   CREATININE 0.80 06/30/2015   BUN 7 06/30/2015   CO2 25 06/30/2015   TSH 3.78 11/05/2014   PSA 0.86 11/05/2014   INR 1.10 06/22/2015   HGBA1C 5.8* 05/03/2013    No results found.  Assessment & Plan:   Diagnoses and all orders for this visit:  B12 deficiency -     CBC with Differential/Platelet; Future -     Basic metabolic panel; Future -     Hepatic function panel; Future -     Vitamin B12; Future -     TSH; Future  Chronic fatigue -     CBC with Differential/Platelet; Future -     Basic metabolic panel; Future -     Hepatic function panel; Future -     Vitamin B12; Future -     TSH; Future  Anxiety state -     CBC with Differential/Platelet; Future -     Basic metabolic panel; Future -     Hepatic function panel; Future -     Vitamin B12; Future -     TSH; Future  Complex sleep apnea syndrome -     CBC with Differential/Platelet; Future -     Basic metabolic panel; Future -     Hepatic function panel; Future -     Vitamin B12; Future -     TSH; Future  I am having Douglas Christensen maintain his cholecalciferol, vitamin B-12, atorvastatin, atenolol, ALPRAZolam,  hydrALAZINE, naproxen sodium, traMADol, aspirin EC, METRONIDAZOLE (TOPICAL), doxycycline, amLODipine, and diazepam.  Meds ordered this encounter  Medications  . diazepam (VALIUM) 5 MG tablet    Sig: TK 1 T PO  Q 6 H PRF ANXIETY.    Refill:  0     Follow-up: No Follow-up on file.  [name], MD

## 2015-09-01 NOTE — Assessment & Plan Note (Signed)
Xanax - rare use prn

## 2015-09-01 NOTE — Assessment & Plan Note (Signed)
On CPAP. ?

## 2015-09-02 LAB — VITAMIN D 25 HYDROXY (VIT D DEFICIENCY, FRACTURES): VIT D 25 HYDROXY: 31 ng/mL (ref 30–100)

## 2015-09-17 ENCOUNTER — Other Ambulatory Visit: Payer: Self-pay | Admitting: Internal Medicine

## 2015-09-21 ENCOUNTER — Telehealth: Payer: Self-pay | Admitting: Internal Medicine

## 2015-09-21 NOTE — Telephone Encounter (Signed)
Hydralazine is tid Take Xanax 0.25 mg 2 tabs at hs prn insomnia Thx

## 2015-09-21 NOTE — Telephone Encounter (Signed)
10.24.16 Pt came in and wanted to let you know that only taking 1 HydrALAZINE has not been working. Stated he use to take it 3 times a day but was cut down to 1 time a day and he's still tired and can't get any sleep. Please advise MS

## 2015-09-24 ENCOUNTER — Other Ambulatory Visit: Payer: Self-pay | Admitting: Internal Medicine

## 2015-09-25 NOTE — Telephone Encounter (Signed)
Called pharmacy had to leave refill on pharmacy vm. Left md approval.../lmb

## 2015-09-28 NOTE — Telephone Encounter (Signed)
Pt informed- he states he has no trouble sleeping. He states he gets plenty of sleep and still feels tired and has no energy. Also, he c/o LLE edema and burning from his hip to knee.

## 2015-09-29 NOTE — Telephone Encounter (Signed)
Is he taking Vit B12? Hold Lipitor x 10-14 d to see if better Thx

## 2015-09-30 NOTE — Telephone Encounter (Signed)
Notified pt with md response. Pt states yes he is taking both Vitamin B12 & Vitamin D. He states he don't know why he feel so tired & has no energy. Is there anything else he can do to help with sxs...[name]

## 2015-10-01 NOTE — Telephone Encounter (Signed)
Not sure what else we can do medically.  Start walking and working out little by little. Thx

## 2015-10-02 NOTE — Telephone Encounter (Signed)
Notified pt with md response.../lmb 

## 2015-10-05 ENCOUNTER — Telehealth: Payer: Self-pay

## 2015-10-05 DIAGNOSIS — Z95828 Presence of other vascular implants and grafts: Secondary | ICD-10-CM

## 2015-10-05 DIAGNOSIS — I739 Peripheral vascular disease, unspecified: Secondary | ICD-10-CM

## 2015-10-05 DIAGNOSIS — I70212 Atherosclerosis of native arteries of extremities with intermittent claudication, left leg: Secondary | ICD-10-CM

## 2015-10-05 NOTE — Telephone Encounter (Signed)
Pt. called to report burning sensation in left thigh x 3 weeks.  Also reported swelling of left lower leg x 1 week.  Reported the swelling improves/ goes away overnight.  Denied any redness or warmth of the left lower leg.  Reported the burning of the left thigh starts with walking on the treadmill, and eases up with rest.  Denied rest pain.  Denied any open sores of left LE.  Related history of Left total hip replacement 2-3 mos. Ago, and reiterated that the burning sensation started 3 weeks ago.  Will discuss with Dr. Trula Slade, and notify pt. o recommendations.  Verb. Understanding.

## 2015-10-06 NOTE — Telephone Encounter (Signed)
Spoke with pt to schedule

## 2015-10-06 NOTE — Telephone Encounter (Signed)
Discussed pt's symptoms with Dr. Donnetta Hutching, MD in office.  Recommended to schedule pt. For LE art duplex, ABI's, and office visit.

## 2015-10-13 ENCOUNTER — Ambulatory Visit (HOSPITAL_COMMUNITY)
Admission: RE | Admit: 2015-10-13 | Discharge: 2015-10-13 | Disposition: A | Discharge status: Home / Self Care | Payer: BLUE CROSS/BLUE SHIELD | Source: Ambulatory Visit | Attending: Vascular Surgery | Admitting: Vascular Surgery

## 2015-10-13 ENCOUNTER — Ambulatory Visit (INDEPENDENT_AMBULATORY_CARE_PROVIDER_SITE_OTHER)
Admission: RE | Admit: 2015-10-13 | Discharge: 2015-10-13 | Disposition: A | Discharge status: Home / Self Care | Payer: BLUE CROSS/BLUE SHIELD | Source: Ambulatory Visit | Attending: Vascular Surgery | Admitting: Vascular Surgery

## 2015-10-13 DIAGNOSIS — I70212 Atherosclerosis of native arteries of extremities with intermittent claudication, left leg: Secondary | ICD-10-CM | POA: Diagnosis not present

## 2015-10-13 DIAGNOSIS — Z95828 Presence of other vascular implants and grafts: Secondary | ICD-10-CM | POA: Insufficient documentation

## 2015-10-13 DIAGNOSIS — I739 Peripheral vascular disease, unspecified: Secondary | ICD-10-CM

## 2015-10-20 ENCOUNTER — Encounter: Payer: Self-pay | Admitting: Family

## 2015-10-26 ENCOUNTER — Ambulatory Visit: Payer: BLUE CROSS/BLUE SHIELD | Admitting: Family

## 2015-12-02 ENCOUNTER — Encounter: Payer: Self-pay | Admitting: Internal Medicine

## 2015-12-02 ENCOUNTER — Ambulatory Visit (INDEPENDENT_AMBULATORY_CARE_PROVIDER_SITE_OTHER)
Admission: RE | Admit: 2015-12-02 | Discharge: 2015-12-02 | Disposition: A | Discharge status: Home / Self Care | Payer: PPO | Source: Ambulatory Visit | Attending: Internal Medicine | Admitting: Internal Medicine

## 2015-12-02 ENCOUNTER — Ambulatory Visit (INDEPENDENT_AMBULATORY_CARE_PROVIDER_SITE_OTHER): Payer: PPO | Admitting: Internal Medicine

## 2015-12-02 VITALS — BP 128/70 | HR 88 | Temp 99.5°F | Resp 20 | Ht 65.0 in | Wt 185.0 lb

## 2015-12-02 DIAGNOSIS — J441 Chronic obstructive pulmonary disease with (acute) exacerbation: Secondary | ICD-10-CM | POA: Diagnosis not present

## 2015-12-02 DIAGNOSIS — R509 Fever, unspecified: Secondary | ICD-10-CM | POA: Diagnosis not present

## 2015-12-02 DIAGNOSIS — R059 Cough, unspecified: Secondary | ICD-10-CM

## 2015-12-02 DIAGNOSIS — R05 Cough: Secondary | ICD-10-CM | POA: Diagnosis not present

## 2015-12-02 MED ORDER — PROMETHAZINE-DM 6.25-15 MG/5ML PO SYRP: 5.0000 mL | ORAL_SOLUTION | Freq: Four times a day (QID) | ORAL | Status: DC | PRN

## 2015-12-02 MED ORDER — IPRATROPIUM-ALBUTEROL 0.5-2.5 (3) MG/3ML IN SOLN: 3.0000 mL | Freq: Once | RESPIRATORY_TRACT | Status: DC

## 2015-12-02 MED ORDER — IPRATROPIUM-ALBUTEROL 20-100 MCG/ACT IN AERS: 1.0000 | INHALATION_SPRAY | Freq: Four times a day (QID) | RESPIRATORY_TRACT | Status: DC | PRN

## 2015-12-02 MED ORDER — METHYLPREDNISOLONE ACETATE 80 MG/ML IJ SUSP
120.0000 mg | Freq: Once | INTRAMUSCULAR | Status: AC
  Administered 2015-12-02: 120 mg via INTRAMUSCULAR

## 2015-12-02 NOTE — Progress Notes (Signed)
Subjective:  Patient ID: Douglas Christensen, male    DOB: 1951/02/18  Age: 65 y.o. MRN: [id number]  CC: Cough and COPD   HPI Douglas Christensen presents for a 5 day history of cough, low-grade fever, sore throat, night sweats, shortness of breath, wheezing,  History Douglas Christensen has a past medical history of History of colonic polyps; COPD (chronic obstructive pulmonary disease) ([place]); CAD (coronary artery disease); GERD (gastroesophageal reflux disease); Hyperlipidemia; Hypertension; Vitamin D deficiency; Meralgia paresthetica of left side (2011); LBP (low back pain); echocardiogram; Anxiety; PVD (peripheral vascular disease) ([place]); Sleep apnea; Shortness of breath; and Arthritis.   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   His family history includes Alzheimer's disease in his father; Cancer in his father and mother; Heart disease in his mother; Hyperlipidemia in his mother; Hypertension in his mother. There is no history of Colon cancer or Heart attack.He reports that he quit smoking about 2 years ago. His smoking use included Cigarettes. He has a 94 pack-year smoking history. He has never used smokeless tobacco. He reports that he drinks about 2.4 oz of alcohol per week. He reports that he does not use illicit drugs.  Outpatient Prescriptions Prior to Visit  Medication Sig Dispense Refill  . ALPRAZolam (XANAX) 0.25 MG tablet TAKE 1 TABLET BY MOUTH TWICE DAILY AS NEEDED FOR ANXIETY. 60 tablet 2  . amLODipine (NORVASC)  10 MG tablet TAKE 0.5 TABLET BY MOUTH EVERY DAY 90 tablet 1  . aspirin EC 325 MG tablet Take 1 tablet (325 mg total) by mouth 2 (two) times daily. 30 tablet 0  . atenolol (TENORMIN) 50 MG tablet TAKE 1 TABLET BY MOUTH EVERY DAY 90 tablet 3  . atorvastatin (LIPITOR) 20 MG tablet TAKE 1 TABLET BY MOUTH EVERY DAY (Patient taking differently: TAKE 20 MG BY MOUTH EVERY DAY) 90 tablet 3  . cholecalciferol (VITAMIN D) 1000 UNITS tablet Take 1,000 Units by mouth daily.     . hydrALAZINE (APRESOLINE) 50 MG tablet TAKE 1 TABLET BY MOUTH THREE TIMES DAILY (Patient taking differently: TAKE 50 MG BY MOUTH THREE TIMES DAILY) 270 tablet 2  . METRONIDAZOLE, TOPICAL, 0.75 % LOTN On face qd 59 mL 3  . traMADol (ULTRAM) 50 MG tablet Take 2 tablets (100 mg total) by mouth 3 (three) times daily as needed. (Patient taking differently: Take 50 mg by mouth 3 (three) times daily as needed for moderate pain. ) 180 tablet 0  . vitamin B-12 (CYANOCOBALAMIN) 1000 MCG tablet Take 1,000 mcg by mouth daily.    . diazepam (VALIUM) 5 MG tablet Reported on 12/02/2015  0  . naproxen sodium (ANAPROX) 220 MG tablet Take 440 mg by mouth as needed (for pain). Reported on 12/02/2015     No facility-administered medications prior to visit.    ROS Review of Systems  Constitutional: Positive for fever. Negative for chills, diaphoresis, activity change, appetite change and fatigue.  HENT: Positive for sore throat. Negative for congestion, facial swelling, postnasal drip, rhinorrhea, sinus pressure, trouble swallowing and voice change.   Eyes: Negative.   Respiratory: Positive for cough, shortness of breath and wheezing. Negative for apnea, choking, chest tightness and stridor.   Cardiovascular: Negative.  Negative for chest pain, palpitations and leg swelling.  Gastrointestinal: Negative.  Negative for nausea, vomiting, abdominal pain, diarrhea, constipation and blood in stool.  Endocrine: Negative.   Genitourinary: Negative.     Musculoskeletal: Negative.  Negative for myalgias, back pain, joint swelling and arthralgias.  Skin: Negative.  Negative for rash.  Allergic/Immunologic: Negative.   Neurological: Negative.  Negative for dizziness.  Hematological: Negative.  Negative for adenopathy. Does not bruise/bleed easily.  Psychiatric/Behavioral: Negative.     Objective:  BP 128/70 mmHg  Pulse 88  Temp(Src) 99.5 F (37.5 C) (Oral)  Resp 20  Ht 5\' 5"  (1.651 m)  Wt 185 lb (83.915 kg)  BMI 30.79 kg/m2  SpO2 94%  Physical Exam  Constitutional: He is oriented to person, place, and time.  Non-toxic appearance. He does not have a sickly appearance. He does not appear ill. No distress.  HENT:  Mouth/Throat: Oropharynx is clear and moist. No oropharyngeal exudate.  Eyes: Conjunctivae are normal. Right eye exhibits no discharge. Left eye exhibits no discharge. No scleral icterus.  Neck: Normal range of motion. Neck supple. No JVD present. No tracheal deviation present. No thyromegaly present.  Cardiovascular: Normal rate, regular rhythm, normal heart sounds and intact distal pulses.  Exam reveals no gallop and no friction rub.   No murmur heard. Pulmonary/Chest: Effort normal. No accessory muscle usage or stridor. No tachypnea. No respiratory distress. He has no decreased breath sounds. He has wheezes in the right lower field and the left lower field. He has rhonchi in the right middle field, the right lower field, the left middle field and the left lower field. He has no rales. He exhibits no tenderness.  He received a nebulized treatment with albuterol and ipatropium-after that his lung exam is clear to auscultation bilaterally anteriorly and posteriorly.  Abdominal: Soft. Bowel sounds are normal. He exhibits no distension and no mass. There is no tenderness. There is no rebound and no guarding.  Musculoskeletal: Normal range of motion. He exhibits no edema or tenderness.  Lymphadenopathy:    He has no cervical  adenopathy.  Neurological: He is oriented to person, place, and time.  Skin: Skin is warm and dry. No rash noted. He is not diaphoretic. No erythema. No pallor.  Vitals reviewed.     Assessment & Plan:   Johnathin was seen today for cough and copd.  Diagnoses and all orders for this visit:  Cough- his chest x-rays normal, will treat for viral upper respiratory infection offer symptom relief with Phenergan DM. -     DG Chest 2 View; Future -     promethazine-dextromethorphan (PROMETHAZINE-DM) 6.25-15 MG/5ML syrup; Take 5 mLs by mouth 4 (four) times daily as needed for cough.  COPD exacerbation ([place])- he is having an acute exacerbation so I gave him an injection of Depo-Medrol. In the office today he had an excellent response to combination of albuterol and ipratropium, will continue that -     Ipratropium-Albuterol (COMBIVENT RESPIMAT) 20-100 MCG/ACT AERS respimat; Inhale 1 puff into the lungs every 6 (six) hours as needed for wheezing. -     promethazine-dextromethorphan (PROMETHAZINE-DM) 6.25-15 MG/5ML syrup; Take 5 mLs by mouth 4 (four) times daily as needed for cough. -     methylPREDNISolone acetate (DEPO-MEDROL) injection 120 mg; Inject 1.5 mLs (120 mg total) into the muscle once.   I am having Douglas Christensen start on Ipratropium-Albuterol and promethazine-dextromethorphan. I am also having him maintain his cholecalciferol, vitamin B-12, atorvastatin, hydrALAZINE, naproxen sodium, traMADol, aspirin EC,  METRONIDAZOLE (TOPICAL), amLODipine, diazepam, atenolol, and ALPRAZolam. We administered methylPREDNISolone acetate.  Meds ordered this encounter  Medications  . Ipratropium-Albuterol (COMBIVENT RESPIMAT) 20-100 MCG/ACT AERS respimat    Sig: Inhale 1 puff into the lungs every 6 (six) hours as needed for wheezing.    Dispense:  4 g    Refill:  11  . promethazine-dextromethorphan (PROMETHAZINE-DM) 6.25-15 MG/5ML syrup    Sig: Take 5 mLs by mouth 4 (four) times daily as needed for cough.     Dispense:  118 mL    Refill:  0  . methylPREDNISolone acetate (DEPO-MEDROL) injection 120 mg    Sig:      Follow-up: Return in about 3 weeks (around 12/23/2015).  Scarlette Calico, MD

## 2015-12-02 NOTE — Patient Instructions (Signed)

## 2015-12-02 NOTE — Progress Notes (Signed)
Pre visit review using our clinic review tool, if applicable. No additional management support is needed unless otherwise documented below in the visit note. 

## 2015-12-03 ENCOUNTER — Telehealth: Payer: Self-pay | Admitting: Internal Medicine

## 2015-12-03 NOTE — Telephone Encounter (Signed)
Pt seen Dr. Ronnald Ramp yesterday states there is a huge copay for albuteral. His insurance will cover ProAir and prpdentia (?) with smaller copay Pharmacy is [facility] on [address]

## 2015-12-04 ENCOUNTER — Telehealth: Payer: Self-pay | Admitting: *Deleted

## 2015-12-04 ENCOUNTER — Other Ambulatory Visit: Payer: Self-pay

## 2015-12-04 MED ORDER — ALBUTEROL SULFATE HFA 108 (90 BASE) MCG/ACT IN AERS: 1.0000 | INHALATION_SPRAY | Freq: Four times a day (QID) | RESPIRATORY_TRACT | Status: DC | PRN

## 2015-12-04 NOTE — Progress Notes (Signed)
Waiting on response from pt 

## 2015-12-04 NOTE — Telephone Encounter (Signed)
Received call pt stated the inhaler (Combivent respimat) Dr. Ronnald Ramp rx his insurance will not cover thye will only cover the Pro Air & Pulmicort. He stated he is still wheezing needing to get inhaler today. Inform pt we will send in [employer].Marland KitchenJohny Christensen

## 2015-12-07 NOTE — Telephone Encounter (Signed)
Proair was given by Dr Ronnald Ramp - what is the problem? Thx

## 2015-12-09 MED ORDER — AMLODIPINE BESYLATE 10 MG PO TABS: 10.0000 mg | ORAL_TABLET | Freq: Every day | ORAL | Status: DC

## 2015-12-18 ENCOUNTER — Encounter: Payer: Self-pay | Admitting: Family

## 2015-12-28 ENCOUNTER — Encounter (HOSPITAL_COMMUNITY): Payer: BLUE CROSS/BLUE SHIELD

## 2015-12-28 ENCOUNTER — Ambulatory Visit: Payer: PPO | Admitting: Family

## 2015-12-31 ENCOUNTER — Other Ambulatory Visit: Payer: Self-pay | Admitting: Internal Medicine

## 2016-01-15 ENCOUNTER — Telehealth: Payer: Self-pay | Admitting: Internal Medicine

## 2016-01-15 NOTE — Telephone Encounter (Signed)
Pt called in and wanted to know if Dr Camila Li could change him back to clonazepam .  Medicare will pay for it and he only has 3 pills left and would like to go back on that

## 2016-01-16 MED ORDER — CLONAZEPAM 0.5 MG PO TABS: 0.5000 mg | ORAL_TABLET | Freq: Two times a day (BID) | ORAL | Status: DC | PRN

## 2016-01-16 NOTE — Telephone Encounter (Signed)
Ok pls call in sch ov Thx

## 2016-01-18 NOTE — Telephone Encounter (Signed)
Left detailed mess informing pt Rx was phoned in and to keep ROV 02/08/16.

## 2016-01-18 NOTE — Telephone Encounter (Signed)
Done. See meds. OV is scheduled for 02/08/16 with PCP.

## 2016-01-20 ENCOUNTER — Telehealth: Payer: Self-pay | Admitting: Internal Medicine

## 2016-01-20 NOTE — Telephone Encounter (Signed)
Patient dropped off dmv paperwork for completion by dr plotnikov. He requested for it to be completed by Monday, but i advised that we may not be able to complete that quickly. Please call patient at the # attached when completed

## 2016-01-21 DIAGNOSIS — Z6829 Body mass index (BMI) 29.0-29.9, adult: Secondary | ICD-10-CM | POA: Diagnosis not present

## 2016-01-21 DIAGNOSIS — Z87891 Personal history of nicotine dependence: Secondary | ICD-10-CM | POA: Diagnosis not present

## 2016-01-21 DIAGNOSIS — R9431 Abnormal electrocardiogram [ECG] [EKG]: Secondary | ICD-10-CM | POA: Diagnosis not present

## 2016-01-21 DIAGNOSIS — Z0000 Encounter for general adult medical examination without abnormal findings: Secondary | ICD-10-CM | POA: Diagnosis not present

## 2016-01-21 DIAGNOSIS — R7309 Other abnormal glucose: Secondary | ICD-10-CM | POA: Diagnosis not present

## 2016-01-21 DIAGNOSIS — F419 Anxiety disorder, unspecified: Secondary | ICD-10-CM | POA: Diagnosis not present

## 2016-01-21 DIAGNOSIS — I739 Peripheral vascular disease, unspecified: Secondary | ICD-10-CM | POA: Diagnosis not present

## 2016-01-21 DIAGNOSIS — I10 Essential (primary) hypertension: Secondary | ICD-10-CM | POA: Diagnosis not present

## 2016-01-21 DIAGNOSIS — E782 Mixed hyperlipidemia: Secondary | ICD-10-CM | POA: Diagnosis not present

## 2016-01-21 DIAGNOSIS — I25119 Atherosclerotic heart disease of native coronary artery with unspecified angina pectoris: Secondary | ICD-10-CM | POA: Diagnosis not present

## 2016-01-21 NOTE — Telephone Encounter (Signed)
Paperwork partially completed and placed on MD's desk for signature

## 2016-01-27 ENCOUNTER — Other Ambulatory Visit: Payer: Self-pay | Admitting: Internal Medicine

## 2016-02-08 ENCOUNTER — Encounter: Payer: PPO | Admitting: Internal Medicine

## 2016-02-12 ENCOUNTER — Encounter: Payer: Self-pay | Admitting: Cardiovascular Disease

## 2016-02-12 ENCOUNTER — Ambulatory Visit (INDEPENDENT_AMBULATORY_CARE_PROVIDER_SITE_OTHER): Payer: PPO | Admitting: Cardiovascular Disease

## 2016-02-12 VITALS — BP 138/79 | HR 68 | Ht 65.0 in | Wt 182.0 lb

## 2016-02-12 DIAGNOSIS — I10 Essential (primary) hypertension: Secondary | ICD-10-CM | POA: Diagnosis not present

## 2016-02-12 DIAGNOSIS — I739 Peripheral vascular disease, unspecified: Secondary | ICD-10-CM

## 2016-02-12 DIAGNOSIS — I2510 Atherosclerotic heart disease of native coronary artery without angina pectoris: Secondary | ICD-10-CM

## 2016-02-12 NOTE — Progress Notes (Signed)
Cardiology Office Note Date:  02/12/2016   ID:  Douglas Christensen, DOB [birth year], MRN [id number]  PCP:  [name], MD  Cardiologist:  Sherren Mocha, MD    No chief complaint on file.    History of Present Illness: Douglas Christensen is a 65 y.o. male who presents for follow-up of CAD. He underwent CABG in 2014. He has extensive PAD and has undergone multiple stenting procedures and femoral endarterectomy.   The patient is doing well. He underwent hip replacement last year and his mobility is now much better. He denies claudication symptoms. Has rare fleeting chest pains but only last a few seconds. No other problems and specifically denies dyspnea, edema, or palpitations. He quit smoking 3 years ago.   Past Medical History  Diagnosis Date  . History of colonic polyps   . COPD (chronic obstructive pulmonary disease) ([place])   . CAD (coronary artery disease)     Mild plaque (cath "years ago"); abnormal Myoview 04/2013 with subsequent CABG x 5 with LIMA to LAD, SVG to OM1, SVG to DX, SVG to PD & PL.   Marland Kitchen GERD (gastroesophageal reflux disease)   . Hyperlipidemia   . Hypertension   . Vitamin D deficiency   . Meralgia paresthetica of left side 2011  . LBP (low back pain)   . Hx of echocardiogram     Echo (9/15):  EF 55-60%; Gr 2 DD, mild BAE  . Anxiety   . PVD (peripheral vascular disease) ([place])     Stent to left common femoral and right superficial femoral.  2008.  50%  left renal   . 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 Shortness of breath     "once in awhile; can happen at anytime" (08/26/2013)  . Arthritis     Past Surgical History  Procedure Laterality Date  . Lower extremity stents      bilateral lower extremities x 2  . Femoral-popliteal bypass graft  12/27/2012    Procedure: BYPASS GRAFT FEMORAL-POPLITEAL ARTERY;  Surgeon: Serafina Mitchell, MD;  Location: [facility] OR;  Service: Vascular;  Laterality: Right;  using non-reversed  sapphenous vein.  . Cardiac catheterization  05/02/13    x2   . Coronary artery bypass graft N/A 05/06/2013    Procedure: CORONARY ARTERY BYPASS GRAFTING (CABG);  Surgeon: Melrose Nakayama, MD;  Location: [place];  Service: Open Heart Surgery;  Laterality: N/A;  Coronary artery bypass graft times five using left internal mammary artery and left greater saphenous vein via endovein harvest.  . Lower extremity angiogram Bilateral 11/16/2011    Procedure: LOWER EXTREMITY ANGIOGRAM;  Surgeon: Sherren Mocha, MD;  Location: [facility] CATH LAB;  Service: Cardiovascular;  Laterality: Bilateral;  . Abdominal aortagram N/A 11/16/2011    Procedure: ABDOMINAL Maxcine Ham;  Surgeon: Sherren Mocha, MD;  Location: [facility] CATH LAB;  Service: Cardiovascular;  Laterality: N/A;  . Percutaneous stent intervention Left 11/16/2011    Procedure: PERCUTANEOUS STENT INTERVENTION;  Surgeon: Sherren Mocha, MD;  Location: [facility] CATH LAB;  Service: Cardiovascular;  Laterality: Left;  . Abdominal aortagram N/A 11/14/2012    Procedure: ABDOMINAL Maxcine Ham;  Surgeon: Sherren Mocha, MD;  Location: [facility] CATH LAB;  Service: Cardiovascular;  Laterality: N/A;  . Abdominal aortagram N/A 12/30/2014    Procedure: ABDOMINAL AORTAGRAM;  Surgeon: Serafina Mitchell, MD;  Location: [facility] CATH LAB;  Service: Cardiovascular;  Laterality: N/A;  . Endarterectomy femoral Left 01/29/2015  Procedure: ENDARTERECTOMY FEMORAL WITH PATCH ANGIOPLASTY;  Surgeon: Serafina Mitchell, MD;  Location: [place];  Service: Vascular;  Laterality: Left;  . Iliac atherectomy Left 01/29/2015    Procedure: SUPERFICIAL FEMORAL ARTERY ATHERECTOMY/PERCUTANEOUS TRANSLUMINAL ANGIOPLASTY; superficial femoral artery stent;  Surgeon: Serafina Mitchell, MD;  Location: [place];  Service: Vascular;  Laterality: Left;  . Tonsillectomy    . Total hip arthroplasty Left 06/29/2015    Procedure: TOTAL HIP ARTHROPLASTY;  Surgeon: Frederik Pear, MD;  Location: [place];  Service: Orthopedics;  Laterality: Left;  LEFT TOTAL  HIP ARTHROPLASTY DEPUY SROM/PINNACLE    Current Outpatient Prescriptions  Medication Sig Dispense Refill  . amLODipine (NORVASC) 10 MG tablet Take 1 tablet (10 mg total) by mouth daily. 90 tablet 1  . aspirin EC 325 MG tablet Take 1 tablet (325 mg total) by mouth 2 (two) times daily. 30 tablet 0  . atenolol (TENORMIN) 50 MG tablet TAKE 1 TABLET BY MOUTH EVERY DAY 90 tablet 3  . atorvastatin (LIPITOR) 20 MG tablet TAKE 1 TABLET BY MOUTH EVERY DAY 90 tablet 2  . cholecalciferol (VITAMIN D) 1000 UNITS tablet Take 1,000 Units by mouth daily.     . clonazePAM (KLONOPIN) 0.5 MG tablet Take 1 tablet (0.5 mg total) by mouth 2 (two) times daily as needed for anxiety. 60 tablet 2  . hydrALAZINE (APRESOLINE) 50 MG tablet TAKE 1 TABLET BY MOUTH THREE TIMES DAILY (Patient taking differently: TAKE 50 MG BY MOUTH THREE TIMES DAILY) 270 tablet 2  . METRONIDAZOLE, TOPICAL, 0.75 % LOTN On face qd 59 mL 3  . vitamin B-12 (CYANOCOBALAMIN) 1000 MCG tablet Take 1,000 mcg by mouth daily.    . naproxen sodium (ANAPROX) 220 MG tablet Take 440 mg by mouth as needed (for pain). Reported on 02/12/2016     Current Facility-Administered Medications  Medication Dose Route Frequency Provider Last Rate Last Dose  . ipratropium-albuterol (DUONEB) 0.5-2.5 (3) MG/3ML nebulizer solution 3 mL  3 mL Nebulization Once Janith Lima, MD        Allergies:   Roxicodone; Benazepril; and Itraconazole   Social History:  The patient  reports that he quit smoking about 3 years ago. His smoking use included Cigarettes. He has a 94 pack-year smoking history. He has never used smokeless tobacco. He reports that he drinks about 2.4 oz of alcohol per week. He reports that he does not use illicit drugs.   Family History:  The patient's  family history includes Alzheimer's disease in his father; Cancer in his father and mother; Heart disease in his mother; Hyperlipidemia in his mother; Hypertension in his mother. There is no history of Colon  cancer or Heart attack.    ROS:  Please see the history of present illness.  Otherwise, review of systems is positive for fatigue, appetite change.  All other systems are reviewed and negative.    PHYSICAL EXAM: VS:  BP 138/79 mmHg  Pulse 68  Ht 5\' 5"  (1.651 m)  Wt 182 lb (82.555 kg)  BMI 30.29 kg/m2 , BMI Body mass index is 30.29 kg/(m^2). GEN: Well nourished, well developed, in no acute distress HEENT: normal Neck: no JVD, no masses. No carotid bruits Cardiac: RRR without murmur or gallop                Respiratory:  clear to auscultation bilaterally, normal work of breathing GI: soft, nontender, nondistended, + BS MS: no deformity or atrophy Ext: no pretibial edema Skin: warm and dry, no rash Neuro:  Strength and sensation are intact Psych: euthymic mood, full affect  EKG:  EKG is not ordered today.  Recent Labs: 09/01/2015: ALT 36; BUN 10; Creatinine, Ser 0.80; Hemoglobin 16.4; Platelets 241.0; Potassium 3.5; Sodium 142; TSH 2.46   Lipid Panel     Component Value Date/Time   CHOL 207* 11/05/2014 0811   TRIG 224.0* 11/05/2014 0811   TRIG 118 09/25/2006 1002   HDL 44.00 11/05/2014 0811   CHOLHDL 5 11/05/2014 0811   CHOLHDL 5.2 CALC 09/25/2006 1002   VLDL 44.8* 11/05/2014 0811   LDLCALC 75 03/13/2014 0841   LDLDIRECT 138.6 11/05/2014 0811      Wt Readings from Last 3 Encounters:  02/12/16 182 lb (82.555 kg)  12/02/15 185 lb (83.915 kg)  09/01/15 186 lb (84.369 kg)     Cardiac Studies Reviewed: Echo 07/2014 EF 55-60%, Gr 2 DD Mild BAE  LHC (6/14):  prox and mid LM 40-50%, prox LAD 70%, ostial Dx 75%, mid LAD 90-95%, prox CFX 60-70%, mid RCA occluded, EF 55-65% >>> CABG  Carotid US (1/14):  Bilateral ICA 1-39%  ASSESSMENT AND PLAN: 1.  CAD, native vessel: stable without sx's of angina. Continue ASA, beta-blocker, statin.   2. PAD with intermittent claudication: followed by Dr Trula Slade. No claudication symptoms at present.   3. Essential HTN: BP  controlled on current Rx.   4. Hyperlipidemia: due for lipids, but non-fasting this morning. Prefers to have checked with Dr Alain Marion.   Current medicines are reviewed with the patient today.  The patient does not have concerns regarding medicines.  Labs/ tests ordered today include:  No orders of the defined types were placed in this encounter.   Disposition:   FU one year  Signed, Sherren Mocha, MD  02/12/2016 8:13 AM    [facility] [address], [place], [place]  [address] Phone: [phone]; Fax: [phone]

## 2016-02-12 NOTE — Patient Instructions (Signed)

## 2016-02-22 DIAGNOSIS — H2511 Age-related nuclear cataract, right eye: Secondary | ICD-10-CM | POA: Diagnosis not present

## 2016-02-22 DIAGNOSIS — H25812 Combined forms of age-related cataract, left eye: Secondary | ICD-10-CM | POA: Diagnosis not present

## 2016-02-26 ENCOUNTER — Encounter: Payer: Self-pay | Admitting: Internal Medicine

## 2016-02-26 ENCOUNTER — Ambulatory Visit (INDEPENDENT_AMBULATORY_CARE_PROVIDER_SITE_OTHER): Payer: PPO | Admitting: Internal Medicine

## 2016-02-26 VITALS — BP 134/70 | HR 62 | Ht 65.0 in | Wt 182.0 lb

## 2016-02-26 DIAGNOSIS — D751 Secondary polycythemia: Secondary | ICD-10-CM | POA: Diagnosis not present

## 2016-02-26 DIAGNOSIS — G4733 Obstructive sleep apnea (adult) (pediatric): Secondary | ICD-10-CM

## 2016-02-26 DIAGNOSIS — Z0000 Encounter for general adult medical examination without abnormal findings: Secondary | ICD-10-CM | POA: Diagnosis not present

## 2016-02-26 DIAGNOSIS — Z951 Presence of aortocoronary bypass graft: Secondary | ICD-10-CM

## 2016-02-26 DIAGNOSIS — I739 Peripheral vascular disease, unspecified: Secondary | ICD-10-CM

## 2016-02-26 DIAGNOSIS — E538 Deficiency of other specified B group vitamins: Secondary | ICD-10-CM | POA: Diagnosis not present

## 2016-02-26 DIAGNOSIS — E559 Vitamin D deficiency, unspecified: Secondary | ICD-10-CM | POA: Diagnosis not present

## 2016-02-26 DIAGNOSIS — G4731 Primary central sleep apnea: Secondary | ICD-10-CM

## 2016-02-26 DIAGNOSIS — Z9989 Dependence on other enabling machines and devices: Secondary | ICD-10-CM

## 2016-02-26 DIAGNOSIS — E785 Hyperlipidemia, unspecified: Secondary | ICD-10-CM

## 2016-02-26 DIAGNOSIS — N320 Bladder-neck obstruction: Secondary | ICD-10-CM

## 2016-02-26 NOTE — Patient Instructions (Signed)

## 2016-02-26 NOTE — Assessment & Plan Note (Signed)
Labs On CPAP

## 2016-02-26 NOTE — Assessment & Plan Note (Addendum)
Here for medicare wellness/physical  Diet: heart healthy  Physical activity: not sedentary  Depression/mood screen: negative  Hearing: intact to whispered voice  Visual acuity: grossly normal, performs annual eye exam  ADLs: capable  Fall risk: none  Home safety: good  Cognitive evaluation: intact to orientation, naming, recall and repetition  EOL planning: adv directives, full code/ I agree  I have personally reviewed and have noted  1. The patient's medical, surgical and social history  2. Their use of alcohol, tobacco or illicit drugs  3. Their current medications and supplements  4. The patient's functional ability including ADL's, fall risks, home safety risks and hearing or visual impairment.  5. Diet and physical activities  6. Evidence for depression or mood disorders 7. The roster of all physicians providing medical care to patient - is listed in the Snapshot section of the chart and reviewed today.    Today patient counseled on age appropriate routine health concerns for screening and prevention, each reviewed and up to date or declined. Immunizations reviewed and up to date or declined. Labs ordered and reviewed. Risk factors for depression reviewed and negative. Hearing function and visual acuity are intact. ADLs screened and addressed as needed. Functional ability and level of safety reviewed and appropriate. Education, counseling and referrals performed based on assessed risks today. Patient provided with a copy of personalized plan for preventive services.    Colon due 2019

## 2016-02-26 NOTE — Assessment & Plan Note (Signed)
Doing well. No CP Amlodipine, Atenolol, Hydralazine, Lipitor, ASA

## 2016-02-26 NOTE — Progress Notes (Signed)
Pre visit review using our clinic review tool, if applicable. No additional management support is needed unless otherwise documented below in the visit note. 

## 2016-02-26 NOTE — Assessment & Plan Note (Signed)
Labs Risks associated with treatment noncompliance were discussed. Compliance was encouraged.

## 2016-02-26 NOTE — Assessment & Plan Note (Signed)
On B12 

## 2016-02-26 NOTE — Assessment & Plan Note (Signed)
Doing well. Lipitor, ASA

## 2016-02-26 NOTE — Assessment & Plan Note (Signed)
Dr Halford Chessman On CPAP

## 2016-02-26 NOTE — Progress Notes (Signed)
Subjective:  Patient ID: Douglas Christensen, male    DOB: 03/11/1951  Age: 65 y.o. MRN: [id number]  CC: No chief complaint on file.   HPI Douglas Christensen presents for OSA, CAD, PAD, dyslipidemia f/u  Outpatient Prescriptions Prior to Visit  Medication Sig Dispense Refill  . amLODipine (NORVASC) 10 MG tablet Take 1 tablet (10 mg total) by mouth daily. 90 tablet 1  . aspirin EC 325 MG tablet Take 1 tablet (325 mg total) by mouth 2 (two) times daily. 30 tablet 0  . atenolol (TENORMIN) 50 MG tablet TAKE 1 TABLET BY MOUTH EVERY DAY 90 tablet 3  . atorvastatin (LIPITOR) 20 MG tablet TAKE 1 TABLET BY MOUTH EVERY DAY 90 tablet 2  . cholecalciferol (VITAMIN D) 1000 UNITS tablet Take 1,000 Units by mouth daily.     . clonazePAM (KLONOPIN) 0.5 MG tablet Take 1 tablet (0.5 mg total) by mouth 2 (two) times daily as needed for anxiety. 60 tablet 2  . hydrALAZINE (APRESOLINE) 50 MG tablet TAKE 1 TABLET BY MOUTH THREE TIMES DAILY (Patient taking differently: TAKE 50 MG BY MOUTH THREE TIMES DAILY) 270 tablet 2  . METRONIDAZOLE, TOPICAL, 0.75 % LOTN On face qd 59 mL 3  . vitamin B-12 (CYANOCOBALAMIN) 1000 MCG tablet Take 1,000 mcg by mouth daily.    . naproxen sodium (ANAPROX) 220 MG tablet Take 440 mg by mouth as needed (for pain). Reported on 02/26/2016     Facility-Administered Medications Prior to Visit  Medication Dose Route Frequency Provider Last Rate Last Dose  . ipratropium-albuterol (DUONEB) 0.5-2.5 (3) MG/3ML nebulizer solution 3 mL  3 mL Nebulization Once Janith Lima, MD        ROS Review of Systems  Constitutional: Negative for appetite change, fatigue and unexpected weight change.  HENT: Negative for congestion, nosebleeds, sinus pressure, sneezing, sore throat and trouble swallowing.   Eyes: Negative for itching and visual disturbance.  Respiratory: Negative for cough.   Cardiovascular: Negative for chest pain, palpitations and leg swelling.  Gastrointestinal: Negative for nausea, diarrhea,  blood in stool and abdominal distention.  Genitourinary: Negative for frequency and hematuria.  Musculoskeletal: Negative for back pain, joint swelling, gait problem and neck pain.  Skin: Negative for rash.  Neurological: Negative for dizziness, tremors, speech difficulty and weakness.  Psychiatric/Behavioral: Negative for suicidal ideas, confusion, sleep disturbance, dysphoric mood and agitation. The patient is not nervous/anxious.     Objective:  BP 134/70 mmHg  Pulse 62  Ht 5\' 5"  (1.651 m)  Wt 182 lb (82.555 kg)  BMI 30.29 kg/m2  SpO2 95%  BP Readings from Last 3 Encounters:  02/26/16 134/70  02/12/16 138/79  12/02/15 128/70    Wt Readings from Last 3 Encounters:  02/26/16 182 lb (82.555 kg)  02/12/16 182 lb (82.555 kg)  12/02/15 185 lb (83.915 kg)    Physical Exam  Constitutional: He is oriented to person, place, and time. He appears well-developed. No distress.  NAD  HENT:  Mouth/Throat: Oropharynx is clear and moist.  Eyes: Conjunctivae are normal. Pupils are equal, round, and reactive to light.  Neck: Normal range of motion. No JVD present. No thyromegaly present.  Cardiovascular: Normal rate, regular rhythm, normal heart sounds and intact distal pulses.  Exam reveals no gallop and no friction rub.   No murmur heard. Pulmonary/Chest: Effort normal and breath sounds normal. No respiratory distress. He has no wheezes. He has no rales. He exhibits no tenderness.  Abdominal: Soft. Bowel sounds are normal. He  exhibits no distension and no mass. There is no tenderness. There is no rebound and no guarding.  Musculoskeletal: Normal range of motion. He exhibits no edema or tenderness.  Lymphadenopathy:    He has no cervical adenopathy.  Neurological: He is alert and oriented to person, place, and time. He has normal reflexes. No cranial nerve deficit. He exhibits normal muscle tone. He displays a negative Romberg sign. Coordination and gait normal.  Skin: Skin is warm and  dry. No rash noted.  Psychiatric: He has a normal mood and affect. His behavior is normal. Judgment and thought content normal.    Lab Results  Component Value Date   WBC 8.2 09/01/2015   HGB 16.4 09/01/2015   HCT 48.6 09/01/2015   PLT 241.0 09/01/2015   GLUCOSE 136* 09/01/2015   CHOL 207* 11/05/2014   TRIG 224.0* 11/05/2014   HDL 44.00 11/05/2014   LDLDIRECT 138.6 11/05/2014   LDLCALC 75 03/13/2014   ALT 36 09/01/2015   AST 43* 09/01/2015   NA 142 09/01/2015   K 3.5 09/01/2015   CL 108 09/01/2015   CREATININE 0.80 09/01/2015   BUN 10 09/01/2015   CO2 23 09/01/2015   TSH 2.46 09/01/2015   PSA 0.86 11/05/2014   INR 1.10 06/22/2015   HGBA1C 5.8* 05/03/2013    Dg Chest 2 View  12/02/2015  CLINICAL DATA:  Cough, aches, and fever for 5 days EXAM: CHEST  2 VIEW COMPARISON:  June 30, 2015 FINDINGS: The heart, hila, mediastinum, lungs, and pleura are unchanged. No acute abnormalities identified. IMPRESSION: No active cardiopulmonary disease. Electronically Signed   By: Dorise Bullion III M.D   On: 12/02/2015 16:42    Assessment & Plan:   Diagnoses and all orders for this visit:  Well adult exam  Polycythemia  OSA on CPAP   I am having Mr. Langfitt maintain his cholecalciferol, vitamin B-12, hydrALAZINE, naproxen sodium, aspirin EC, METRONIDAZOLE (TOPICAL), atenolol, amLODipine, atorvastatin, clonazePAM, and DUREZOL. We will continue to administer ipratropium-albuterol.  Meds ordered this encounter  Medications  . DUREZOL 0.05 % EMUL    Sig: Reported on 02/26/2016     Follow-up: No Follow-up on file.  [name], MD

## 2016-02-26 NOTE — Assessment & Plan Note (Signed)
2016 Very compliant w/CPAP

## 2016-03-01 ENCOUNTER — Other Ambulatory Visit (INDEPENDENT_AMBULATORY_CARE_PROVIDER_SITE_OTHER): Payer: PPO

## 2016-03-01 ENCOUNTER — Other Ambulatory Visit: Payer: Self-pay | Admitting: Internal Medicine

## 2016-03-01 ENCOUNTER — Ambulatory Visit (INDEPENDENT_AMBULATORY_CARE_PROVIDER_SITE_OTHER): Payer: PPO | Admitting: Geriatric Medicine

## 2016-03-01 DIAGNOSIS — E538 Deficiency of other specified B group vitamins: Secondary | ICD-10-CM | POA: Diagnosis not present

## 2016-03-01 DIAGNOSIS — E785 Hyperlipidemia, unspecified: Secondary | ICD-10-CM | POA: Diagnosis not present

## 2016-03-01 DIAGNOSIS — N320 Bladder-neck obstruction: Secondary | ICD-10-CM | POA: Diagnosis not present

## 2016-03-01 DIAGNOSIS — Z0000 Encounter for general adult medical examination without abnormal findings: Secondary | ICD-10-CM

## 2016-03-01 DIAGNOSIS — E559 Vitamin D deficiency, unspecified: Secondary | ICD-10-CM | POA: Diagnosis not present

## 2016-03-01 LAB — VITAMIN B12: VITAMIN B 12: 879 pg/mL (ref 211–911)

## 2016-03-01 LAB — CBC WITH DIFFERENTIAL/PLATELET
BASOS PCT: 0.4 % (ref 0.0–3.0)
Basophils Absolute: 0 10*3/uL (ref 0.0–0.1)
EOS PCT: 2 % (ref 0.0–5.0)
Eosinophils Absolute: 0.2 10*3/uL (ref 0.0–0.7)
HCT: 46.4 % (ref 39.0–52.0)
HEMOGLOBIN: 16 g/dL (ref 13.0–17.0)
LYMPHS ABS: 2.2 10*3/uL (ref 0.7–4.0)
Lymphocytes Relative: 21.2 % (ref 12.0–46.0)
MCHC: 34.5 g/dL (ref 30.0–36.0)
MCV: 97.4 fl (ref 78.0–100.0)
MONO ABS: 0.8 10*3/uL (ref 0.1–1.0)
MONOS PCT: 7.3 % (ref 3.0–12.0)
NEUTROS PCT: 69.1 % (ref 43.0–77.0)
Neutro Abs: 7.1 10*3/uL (ref 1.4–7.7)
PLATELETS: 298 10*3/uL (ref 150.0–400.0)
RBC: 4.76 Mil/uL (ref 4.22–5.81)
RDW: 14.2 % (ref 11.5–15.5)
WBC: 10.3 10*3/uL (ref 4.0–10.5)

## 2016-03-01 LAB — TSH: TSH: 2.67 u[IU]/mL (ref 0.35–4.50)

## 2016-03-01 LAB — LIPID PANEL
CHOLESTEROL: 185 mg/dL (ref 0–200)
HDL: 38.9 mg/dL — AB (ref >39.00)
LDL Cholesterol: 117 mg/dL — ABNORMAL HIGH (ref 0–99)
NonHDL: 146.49
TRIGLYCERIDES: 148 mg/dL (ref 0.0–149.0)
Total CHOL/HDL Ratio: 5
VLDL: 29.6 mg/dL (ref 0.0–40.0)

## 2016-03-01 LAB — HEPATIC FUNCTION PANEL
ALK PHOS: 66 U/L (ref 39–117)
ALT: 36 U/L (ref 0–53)
AST: 39 U/L — ABNORMAL HIGH (ref 0–37)
Albumin: 4 g/dL (ref 3.5–5.2)
BILIRUBIN DIRECT: 0.2 mg/dL (ref 0.0–0.3)
BILIRUBIN TOTAL: 1 mg/dL (ref 0.2–1.2)
TOTAL PROTEIN: 6.8 g/dL (ref 6.0–8.3)

## 2016-03-01 LAB — BASIC METABOLIC PANEL
BUN: 13 mg/dL (ref 6–23)
CHLORIDE: 104 meq/L (ref 96–112)
CO2: 23 mEq/L (ref 19–32)
Calcium: 9.3 mg/dL (ref 8.4–10.5)
Creatinine, Ser: 0.76 mg/dL (ref 0.40–1.50)
GFR: 109.4 mL/min (ref >60.00)
GLUCOSE: 81 mg/dL (ref 70–99)
POTASSIUM: 3.6 meq/L (ref 3.5–5.1)
SODIUM: 138 meq/L (ref 135–145)

## 2016-03-01 LAB — VITAMIN D 25 HYDROXY (VIT D DEFICIENCY, FRACTURES): VITD: 25.36 ng/mL — ABNORMAL LOW (ref 30.00–100.00)

## 2016-03-01 LAB — PSA: PSA: 0.61 ng/mL (ref 0.10–4.00)

## 2016-03-01 MED ORDER — CYANOCOBALAMIN 1000 MCG/ML IJ SOLN
1000.0000 ug | Freq: Once | INTRAMUSCULAR | Status: AC
  Administered 2016-03-01: 1000 ug via INTRAMUSCULAR

## 2016-03-01 MED ORDER — VITAMIN D 1000 UNITS PO TABS: 2000.0000 [IU] | ORAL_TABLET | Freq: Every day | ORAL | Status: DC

## 2016-03-02 LAB — HEPATITIS C ANTIBODY: HCV Ab: NEGATIVE

## 2016-03-08 DIAGNOSIS — G4730 Sleep apnea, unspecified: Secondary | ICD-10-CM | POA: Diagnosis not present

## 2016-03-21 ENCOUNTER — Telehealth: Payer: Self-pay

## 2016-03-21 NOTE — Telephone Encounter (Signed)
[employer] medical forms received completed.  Copy sent to scan, original placed in cabinet for pt pick up, pt advised of same via personal VM

## 2016-03-24 DIAGNOSIS — H268 Other specified cataract: Secondary | ICD-10-CM | POA: Diagnosis not present

## 2016-03-24 DIAGNOSIS — H2512 Age-related nuclear cataract, left eye: Secondary | ICD-10-CM | POA: Diagnosis not present

## 2016-03-24 HISTORY — PX: EYE SURGERY: SHX253

## 2016-04-04 ENCOUNTER — Ambulatory Visit (INDEPENDENT_AMBULATORY_CARE_PROVIDER_SITE_OTHER): Payer: PPO | Admitting: Family Medicine

## 2016-04-04 ENCOUNTER — Encounter: Payer: Self-pay | Admitting: Family Medicine

## 2016-04-04 VITALS — BP 140/72 | HR 90 | Temp 98.5°F | Resp 12 | Ht 65.0 in | Wt 185.2 lb

## 2016-04-04 DIAGNOSIS — J449 Chronic obstructive pulmonary disease, unspecified: Secondary | ICD-10-CM

## 2016-04-04 DIAGNOSIS — J028 Acute pharyngitis due to other specified organisms: Secondary | ICD-10-CM

## 2016-04-04 DIAGNOSIS — J309 Allergic rhinitis, unspecified: Secondary | ICD-10-CM

## 2016-04-04 LAB — POCT RAPID STREP A (OFFICE): Rapid Strep A Screen: NEGATIVE

## 2016-04-04 MED ORDER — FLUTICASONE PROPIONATE 50 MCG/ACT NA SUSP: 1.0000 | Freq: Two times a day (BID) | NASAL | Status: DC

## 2016-04-04 MED ORDER — ALBUTEROL SULFATE HFA 108 (90 BASE) MCG/ACT IN AERS: 2.0000 | INHALATION_SPRAY | Freq: Four times a day (QID) | RESPIRATORY_TRACT | Status: DC | PRN

## 2016-04-04 MED ORDER — PREDNISONE 20 MG PO TABS: 40.0000 mg | ORAL_TABLET | Freq: Every day | ORAL | Status: DC

## 2016-04-04 NOTE — Progress Notes (Signed)
Pre visit review using our clinic review tool, if applicable. No additional management support is needed unless otherwise documented below in the visit note. 

## 2016-04-04 NOTE — Patient Instructions (Addendum)
  A few things to remember from today's visit:   1. Acute pharyngitis due to other specified organisms   viral infections are self-limited and we treat each symptom depending of severity.  Over the counter medications as decongestants and cold medications usually help BUT NOT recommended because they can elevated blood pressure.   Tylenol and throat lozenges. Plenty of fluids. Honey helps with cough.  Steam inhalations helps with runny nose, nasal congestion, and may prevent sinus infections. Cough and nasal congestion could last a few days and sometimes weeks.  Please follow in not any better in 1-2 weeks or if symptoms get worse.  - POCT rapid strep A - Culture, Group A Strep  2. Allergic rhinitis, unspecified allergic rhinitis type Could aggravate symptoms.   Nasal spray, Flonase. Allegra (plain) 180 mg daily.   - predniSONE (DELTASONE) 20 MG tablet; Take 2 tablets (40 mg total) by mouth daily with breakfast.  Dispense: 6 tablet; Refill: 0 - fluticasone (FLONASE) 50 MCG/ACT nasal spray; Place 1 spray into both nostrils 2 (two) times daily.  Dispense: 16 g; Refill: 3  3. Chronic obstructive pulmonary disease, unspecified COPD type (HCC) Albuterol for a week then as needed.  Prednisone with breakfast for 3 days, this may help with sore throat, nasal congestion and wheezing.   - albuterol (PROVENTIL HFA;VENTOLIN HFA) 108 (90 Base) MCG/ACT inhaler; Inhale 2 puffs into the lungs every 6 (six) hours as needed for wheezing or shortness of breath.  Dispense: 1 Inhaler; Refill: 0 - predniSONE (DELTASONE) 20 MG tablet; Take 2 tablets (40 mg total) by mouth daily with breakfast.  Dispense: 6 tablet; Refill: 0

## 2016-04-04 NOTE — Progress Notes (Signed)
Subjective:    Patient ID: Douglas Christensen, male    DOB: [birth year], 65 y.o.   MRN: [id number]  HPI  Douglas Christensen is a 65 y.o.male here today complaining of 5-7 days of respiratory symptoms.  + Odynophagia, non productive cough, subjective fever (last time 2-3 days ago), rhinorrhea, nasal congestion, and epiphora. + Wheezing, former smoker, states that he has an inhaler but has not used it lately. + Hx of COPD. No dyspnea or chest pain. He also mentions LLE edema, has had it intermittently, Hx of PAD, denies pain or erythema. + Bitemporal pressure headache, no associated visual changes, nausea, or vomiting.  No Hx of recent travel. No sick contact. No known insect bite. Denies Hx of allergies but allergic rhinitis listed on his records.  He has tried OTC Sudafed.  Symptoms otherwise stable.   Review of Systems  Constitutional: Positive for fatigue. Negative for fever, chills and appetite change.  HENT: Positive for congestion, rhinorrhea and sore throat. Negative for ear pain, mouth sores, postnasal drip, sneezing, trouble swallowing and voice change.   Eyes: Positive for discharge. Negative for redness, itching and visual disturbance.  Respiratory: Positive for cough and wheezing. Negative for chest tightness and shortness of breath.        Former smoker. Hx of OSA, wearing CPAP.  Cardiovascular: Positive for leg swelling (no pain, Hx of PAD.). Negative for chest pain and palpitations.  Gastrointestinal: Negative for nausea, vomiting, abdominal pain and diarrhea.  Musculoskeletal: Negative for myalgias and neck pain.       No associated arthralgias or back pain.  Skin: Negative for color change and rash.  Neurological: Positive for headaches (bitemporal.). Negative for weakness.  Psychiatric/Behavioral: Negative for confusion.     Current Outpatient Prescriptions on File Prior to Visit  Medication Sig Dispense Refill  . amLODipine (NORVASC) 10 MG tablet Take 1 tablet (10  mg total) by mouth daily. 90 tablet 1  . aspirin EC 325 MG tablet Take 1 tablet (325 mg total) by mouth 2 (two) times daily. 30 tablet 0  . atenolol (TENORMIN) 50 MG tablet TAKE 1 TABLET BY MOUTH EVERY DAY 90 tablet 3  . atorvastatin (LIPITOR) 20 MG tablet TAKE 1 TABLET BY MOUTH EVERY DAY 90 tablet 2  . cholecalciferol (VITAMIN D) 1000 units tablet Take 2 tablets (2,000 Units total) by mouth daily. 100 tablet 3  . clonazePAM (KLONOPIN) 0.5 MG tablet Take 1 tablet (0.5 mg total) by mouth 2 (two) times daily as needed for anxiety. 60 tablet 2  . DUREZOL 0.05 % EMUL Reported on 02/26/2016    . hydrALAZINE (APRESOLINE) 50 MG tablet TAKE 1 TABLET BY MOUTH THREE TIMES DAILY (Patient taking differently: TAKE 50 MG BY MOUTH THREE TIMES DAILY) 270 tablet 2  . METRONIDAZOLE, TOPICAL, 0.75 % LOTN On face qd 59 mL 3  . vitamin B-12 (CYANOCOBALAMIN) 1000 MCG tablet Take 1,000 mcg by mouth daily.    . naproxen sodium (ANAPROX) 220 MG tablet Take 440 mg by mouth as needed (for pain). Reported on 04/04/2016     Current Facility-Administered Medications on File Prior to Visit  Medication Dose Route Frequency Provider Last Rate Last Dose  . ipratropium-albuterol (DUONEB) 0.5-2.5 (3) MG/3ML nebulizer solution 3 mL  3 mL Nebulization Once Janith Lima, MD         Past Medical History  Diagnosis Date  . History of colonic polyps   . COPD (chronic obstructive pulmonary disease) ([place])   . CAD (  coronary artery disease)     Mild plaque (cath "years ago"); abnormal Myoview 04/2013 with subsequent CABG x 5 with LIMA to LAD, SVG to OM1, SVG to DX, SVG to PD & PL.   Marland Kitchen GERD (gastroesophageal reflux disease)   . Hyperlipidemia   . Hypertension   . Vitamin D deficiency   . Meralgia paresthetica of left side 2011  . LBP (low back pain)   . Hx of echocardiogram     Echo (9/15):  EF 55-60%; Gr 2 DD, mild BAE  . Anxiety   . PVD (peripheral vascular disease) ([place])     Stent to left common femoral and right superficial  femoral.  2008.  50%  left renal   . 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 Shortness of breath     "once in awhile; can happen at anytime" (08/26/2013)  . Arthritis     Social History   Social History  . Marital Status: Widowed    Spouse Name: N/A  . Number of Children: 2  . Years of Education: N/A   Occupational History  . [employer]   Social History Main Topics  . Smoking status: Former Smoker -- 2.00 packs/day for 47 years    Types: Cigarettes    Quit date: 12/17/2012  . Smokeless tobacco: Never Used  . Alcohol Use: 2.4 oz/week    4 Shots of liquor per week     Comment: 08/26/2013 "3-4 mixed drinks/wk"  . Drug Use: No  . Sexual Activity: Yes   Other Topics Concern  . None   Social History Narrative   Widower    Filed Vitals:   04/04/16 1547  BP: 140/72  Pulse: 90  Temp: 98.5 F (36.9 C)  Resp: 12   Body mass index is 30.82 kg/(m^2).  SpO2 Readings from Last 3 Encounters:  04/04/16 97%  02/26/16 95%  12/02/15 94%       Objective:   Physical Exam  Constitutional: He is oriented to person, place, and time. He appears well-developed. No distress.  HENT:  Right Ear: External ear normal. Tympanic membrane is not erythematous. A middle ear effusion (mild) is present.  Left Ear: Tympanic membrane and external ear normal.  Nose: No rhinorrhea. Right sinus exhibits no maxillary sinus tenderness and no frontal sinus tenderness. Left sinus exhibits no maxillary sinus tenderness and no frontal sinus tenderness.  Mouth/Throat: Uvula is midline and mucous membranes are normal. No oral lesions. Posterior oropharyngeal erythema (mild) present. No oropharyngeal exudate or posterior oropharyngeal edema.  Hypertrophic turbinates, nasal voice.  Eyes: Conjunctivae are normal. Right eye exhibits no discharge. Left eye exhibits no discharge.  Cardiovascular: Normal rate and regular rhythm.   No murmur  heard. DP present bilateral.  Pulmonary/Chest: Effort normal and breath sounds normal. No respiratory distress. He has no wheezes. He has no rales.  Musculoskeletal: He exhibits edema (RLE trace pitting edema, LLE 1+ pitting edema.No erythema, no calf tenderness.).  Lymphadenopathy:    He has cervical adenopathy (posterior cervical bilateral, about 1 cm, right side mildly tender).  Neurological: He is alert and oriented to person, place, and time. Coordination and gait normal.  Skin: Skin is warm. No rash noted.  Psychiatric: He has a normal mood and affect. His speech is normal.  Nursing note and vitals reviewed.      Assessment & Plan:   Willy was seen today for cough and swelling in left  leg.  Diagnoses and all orders for this visit:  Acute pharyngitis due to other specified organisms  Symptoms suggests a viral etiology, I explained Mr Maharrey that symptomatic treatment is usually recommended in this case, so I do not think abx is needed at this time. Instructed to monitor for signs of complications, including new onset of fever among some, clearly instructed about warning signs. I also explained that cough and nasal congestion can last a few days and sometimes weeks. Will follow strep Cx. F/U as needed.   -     POCT rapid strep A -     Culture, Group A Strep  Allergic rhinitis, unspecified allergic rhinitis type  Could be contributing to symptoms. I do not recommend taking OTC decongestants or cold meds due to Hx of PAD and HTN, his SBP today slightly elevated. Allegra 180 mg daily may also help.  -     predniSONE (DELTASONE) 20 MG tablet; Take 2 tablets (40 mg total) by mouth daily with breakfast. -     fluticasone (FLONASE) 50 MCG/ACT nasal spray; Place 1 spray into both nostrils 2 (two) times daily.  Chronic obstructive pulmonary disease, unspecified COPD type ([place])  Today lung auscultation negative, no cough during exam. I do not think imaging is needed today but will  be necessary if worsening symptoms. Albuterol inh 2 puff qid for 1 week then as needed. Prednisone side effects discussed, recommended starting tomorrow with breakfast. Instructed about warning signs.  -     albuterol (PROVENTIL HFA;VENTOLIN HFA) 108 (90 Base) MCG/ACT inhaler; Inhale 2 puffs into the lungs every 6 (six) hours as needed for wheezing or shortness of breath. -     predniSONE (DELTASONE) 20 MG tablet; Take 2 tablets (40 mg total) by mouth daily with breakfast.   LLE edema seems to be chronic, today examination does not suggest serious precess, clearly instructed about warning signs.   -Patient advised to return or notify a doctor immediately if symptoms worsen or persist or new concerns arise.     Ramie Erman G. Martinique, MD  [facility]. [place] office.

## 2016-04-06 ENCOUNTER — Encounter: Payer: Self-pay | Admitting: Family Medicine

## 2016-04-06 LAB — CULTURE, GROUP A STREP: Organism ID, Bacteria: NORMAL

## 2016-04-12 ENCOUNTER — Ambulatory Visit (INDEPENDENT_AMBULATORY_CARE_PROVIDER_SITE_OTHER): Payer: PPO | Admitting: Family Medicine

## 2016-04-12 ENCOUNTER — Encounter: Payer: Self-pay | Admitting: Family Medicine

## 2016-04-12 VITALS — BP 132/58 | HR 54 | Temp 98.4°F | Resp 12 | Ht 65.0 in | Wt 188.3 lb

## 2016-04-12 DIAGNOSIS — J309 Allergic rhinitis, unspecified: Secondary | ICD-10-CM | POA: Diagnosis not present

## 2016-04-12 DIAGNOSIS — H6983 Other specified disorders of Eustachian tube, bilateral: Secondary | ICD-10-CM

## 2016-04-12 DIAGNOSIS — J3489 Other specified disorders of nose and nasal sinuses: Secondary | ICD-10-CM | POA: Diagnosis not present

## 2016-04-12 MED ORDER — AMOXICILLIN 875 MG PO TABS
875.0000 mg | ORAL_TABLET | Freq: Two times a day (BID) | ORAL | Status: DC
Start: 2016-04-12 — End: 2016-04-29

## 2016-04-12 MED ORDER — AZELASTINE HCL 0.1 % NA SOLN: 1.0000 | Freq: Two times a day (BID) | NASAL | Status: DC

## 2016-04-12 NOTE — Progress Notes (Signed)
Subjective:    Patient ID: Douglas Christensen, male    DOB: Jul 11, 1951, 65 y.o.   MRN: [id number]  HPI   Douglas Christensen is a 65 y.o.male here today complaining of persistent respiratory symptoms, "head congestion", getting worse. I  saw him on 04/04/16 when he had symptoms, odynophagia mainly, for about 5-7 days.  Short course of Prednisone and Flonase nasal spray recommended, he doe snot feel like it helped.  No fever, chills, or myalgias.  Rapid strep and strep Cx done last OV,both negative. Odynophagia has improved. + Nasal congestion, ear fullness R>L, right frontal headache. Frontal headache sometimes is "bad" and wakes him up.No associated visual changes, nausea, or vomiting.  Hx of COPD, he has not had cough,wheeezing, or dyspnea.  No Hx of recent travel. No sick contact.  + Hx of allergic rhinitis.   Review of Systems  Constitutional: Negative for fever, chills, appetite change and fatigue.  HENT: Positive for congestion, rhinorrhea, sinus pressure and sore throat. Negative for ear pain, facial swelling, hearing loss, mouth sores, postnasal drip, sneezing, trouble swallowing and voice change.   Eyes: Negative for discharge, redness and itching.  Respiratory: Negative for cough, chest tightness, shortness of breath and wheezing.   Cardiovascular: Negative for chest pain and palpitations.  Gastrointestinal: Negative for nausea, vomiting and abdominal pain.  Musculoskeletal: Negative for myalgias and neck pain.  Skin: Negative for color change and rash.  Allergic/Immunologic: Positive for environmental allergies.  Neurological: Positive for headaches. Negative for syncope, speech difficulty, weakness and numbness.  Hematological: Negative for adenopathy.     Current Outpatient Prescriptions on File Prior to Visit  Medication Sig Dispense Refill  . albuterol (PROVENTIL HFA;VENTOLIN HFA) 108 (90 Base) MCG/ACT inhaler Inhale 2 puffs into the lungs every 6 (six) hours as needed  for wheezing or shortness of breath. 1 Inhaler 0  . amLODipine (NORVASC) 10 MG tablet Take 1 tablet (10 mg total) by mouth daily. 90 tablet 1  . aspirin EC 325 MG tablet Take 1 tablet (325 mg total) by mouth 2 (two) times daily. 30 tablet 0  . atenolol (TENORMIN) 50 MG tablet TAKE 1 TABLET BY MOUTH EVERY DAY 90 tablet 3  . atorvastatin (LIPITOR) 20 MG tablet TAKE 1 TABLET BY MOUTH EVERY DAY 90 tablet 2  . cholecalciferol (VITAMIN D) 1000 units tablet Take 2 tablets (2,000 Units total) by mouth daily. 100 tablet 3  . clonazePAM (KLONOPIN) 0.5 MG tablet Take 1 tablet (0.5 mg total) by mouth 2 (two) times daily as needed for anxiety. 60 tablet 2  . DUREZOL 0.05 % EMUL Reported on 02/26/2016    . fluticasone (FLONASE) 50 MCG/ACT nasal spray Place 1 spray into both nostrils 2 (two) times daily. 16 g 3  . hydrALAZINE (APRESOLINE) 50 MG tablet TAKE 1 TABLET BY MOUTH THREE TIMES DAILY (Patient taking differently: TAKE 50 MG BY MOUTH THREE TIMES DAILY) 270 tablet 2  . METRONIDAZOLE, TOPICAL, 0.75 % LOTN On face qd 59 mL 3  . naproxen sodium (ANAPROX) 220 MG tablet Take 440 mg by mouth as needed (for pain). Reported on 04/04/2016    . vitamin B-12 (CYANOCOBALAMIN) 1000 MCG tablet Take 1,000 mcg by mouth daily.     Current Facility-Administered Medications on File Prior to Visit  Medication Dose Route Frequency Provider Last Rate Last Dose  . ipratropium-albuterol (DUONEB) 0.5-2.5 (3) MG/3ML nebulizer solution 3 mL  3 mL Nebulization Once Janith Lima, MD  Past Medical History  Diagnosis Date  . History of colonic polyps   . COPD (chronic obstructive pulmonary disease) ([place])   . CAD (coronary artery disease)     Mild plaque (cath "years ago"); abnormal Myoview 04/2013 with subsequent CABG x 5 with LIMA to LAD, SVG to OM1, SVG to DX, SVG to PD & PL.   Marland Kitchen GERD (gastroesophageal reflux disease)   . Hyperlipidemia   . Hypertension   . Vitamin D deficiency   . Meralgia paresthetica of left side  2011  . LBP (low back pain)   . Hx of echocardiogram     Echo (9/15):  EF 55-60%; Gr 2 DD, mild BAE  . Anxiety   . PVD (peripheral vascular disease) ([place])     Stent to left common femoral and right superficial femoral.  2008.  50%  left renal   . 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 Shortness of breath     "once in awhile; can happen at anytime" (08/26/2013)  . Arthritis     Social History   Social History  . Marital Status: Widowed    Spouse Name: N/A  . Number of Children: 2  . Years of Education: N/A   Occupational History  . [employer]   Social History Main Topics  . Smoking status: Former Smoker -- 2.00 packs/day for 47 years    Types: Cigarettes    Quit date: 12/17/2012  . Smokeless tobacco: Never Used  . Alcohol Use: 2.4 oz/week    4 Shots of liquor per week     Comment: 08/26/2013 "3-4 mixed drinks/wk"  . Drug Use: No  . Sexual Activity: Yes   Other Topics Concern  . None   Social History Narrative   Widower    Filed Vitals:   04/12/16 1307  BP: 132/58  Pulse: 54  Temp: 98.4 F (36.9 C)   Body mass index is 31.34 kg/(m^2).  SpO2 Readings from Last 3 Encounters:  04/12/16 98%  04/04/16 97%  02/26/16 95%       Objective:   Physical Exam  Constitutional: He is oriented to person, place, and time. He appears well-developed. He does not appear ill. No distress.  HENT:  Right Ear: External ear normal. A middle ear effusion is present.  Left Ear: External ear normal. A middle ear effusion is present.  Nose: No rhinorrhea. Right sinus exhibits no maxillary sinus tenderness and no frontal sinus tenderness. Left sinus exhibits no maxillary sinus tenderness and no frontal sinus tenderness.  Mouth/Throat: Oropharynx is clear and moist and mucous membranes are normal. No oral lesions. No oropharyngeal exudate or posterior oropharyngeal erythema.  Hypertrophic turbinates. Nasal  voice.   Eyes: Conjunctivae and EOM are normal.  Cardiovascular: Normal rate and regular rhythm.   No murmur heard. HR 60/min.  Pulmonary/Chest: Effort normal and breath sounds normal. No respiratory distress. He has no wheezes. He has no rales.  Lymphadenopathy:    He has no cervical adenopathy.  Neurological: He is alert and oriented to person, place, and time. Coordination and gait normal.  Skin: Skin is warm. No rash noted.  Psychiatric: He has a normal mood and affect.  Nursing note and vitals reviewed.      Assessment & Plan:     Douglas Christensen was seen today for facial pain and headache.  Diagnoses and all orders for this visit:  Frontal sinus pain  Possible causes discussed,sinus  pressure from congestion vs sinusitis. Because worsening symptom abx was recommended. If not better in 1-2 weeks I think sinus CT is going to be necessary. Continue Flonase nasal spray and steam inhalations.   -     amoxicillin (AMOXIL) 875 MG tablet; Take 1 tablet (875 mg total) by mouth 2 (two) times daily.   Allergic rhinitis, unspecified allergic rhinitis type  Continue Flonase nasal spray. Start Astelin nasal spray. Continue nasal saline drops.  F/U in 2 weeks with pcp if needed.  -     azelastine (ASTELIN) 0.1 % nasal spray; Place 1 spray into both nostrils 2 (two) times daily. Use in each nostril as directed   Eustachian tube dysfunction, bilateral  Autoinflation maneuvers recommended. I do not recommend decongestants due to possible side effects. Instructed about warning signs. F/U as needed.      Betty G. Martinique, MD  [facility]. [place] office.

## 2016-04-12 NOTE — Progress Notes (Signed)
Pre visit review using our clinic review tool, if applicable. No additional management support is needed unless otherwise documented below in the visit note. 

## 2016-04-12 NOTE — Patient Instructions (Signed)
A few things to remember from today's visit:   1. Frontal sinus pain ? Sinus infection vs sinus congestion.  Continue steam inhalations. If not better in 1-2 weeks sinus CT may be necessary.  - amoxicillin (AMOXIL) 875 MG tablet; Take 1 tablet (875 mg total) by mouth 2 (two) times daily.  Dispense: 20 tablet; Refill: 0  2. Allergic rhinitis, unspecified allergic rhinitis type Continue Flonase nasal spray and start Astelin. Saline drops. Popping ears may help, I do not recommend decongestants.  - azelastine (ASTELIN) 0.1 % nasal spray; Place 1 spray into both nostrils 2 (two) times daily. Use in each nostril as directed  Dispense: 30 mL; Refill: 2   Please follow with pcp if needed.

## 2016-04-18 DIAGNOSIS — R918 Other nonspecific abnormal finding of lung field: Secondary | ICD-10-CM | POA: Diagnosis not present

## 2016-04-18 DIAGNOSIS — M79605 Pain in left leg: Secondary | ICD-10-CM | POA: Diagnosis not present

## 2016-04-18 DIAGNOSIS — M79604 Pain in right leg: Secondary | ICD-10-CM | POA: Diagnosis not present

## 2016-04-18 DIAGNOSIS — Z9889 Other specified postprocedural states: Secondary | ICD-10-CM | POA: Diagnosis not present

## 2016-04-18 DIAGNOSIS — I10 Essential (primary) hypertension: Secondary | ICD-10-CM | POA: Diagnosis not present

## 2016-04-18 DIAGNOSIS — Z87891 Personal history of nicotine dependence: Secondary | ICD-10-CM | POA: Diagnosis not present

## 2016-04-18 DIAGNOSIS — R52 Pain, unspecified: Secondary | ICD-10-CM | POA: Diagnosis not present

## 2016-04-18 DIAGNOSIS — M7989 Other specified soft tissue disorders: Secondary | ICD-10-CM | POA: Diagnosis not present

## 2016-04-18 DIAGNOSIS — R279 Unspecified lack of coordination: Secondary | ICD-10-CM | POA: Diagnosis not present

## 2016-04-18 DIAGNOSIS — M6281 Muscle weakness (generalized): Secondary | ICD-10-CM | POA: Diagnosis not present

## 2016-04-22 ENCOUNTER — Encounter: Payer: Self-pay | Admitting: Internal Medicine

## 2016-04-23 DIAGNOSIS — R600 Localized edema: Secondary | ICD-10-CM | POA: Insufficient documentation

## 2016-04-23 DIAGNOSIS — I10 Essential (primary) hypertension: Secondary | ICD-10-CM | POA: Diagnosis not present

## 2016-04-23 DIAGNOSIS — I739 Peripheral vascular disease, unspecified: Secondary | ICD-10-CM | POA: Diagnosis not present

## 2016-04-23 DIAGNOSIS — M7989 Other specified soft tissue disorders: Secondary | ICD-10-CM | POA: Diagnosis not present

## 2016-04-25 ENCOUNTER — Encounter: Payer: Self-pay | Admitting: Surgery

## 2016-04-25 ENCOUNTER — Encounter (HOSPITAL_COMMUNITY): Payer: Self-pay | Admitting: Nurse Practitioner

## 2016-04-25 ENCOUNTER — Emergency Department (HOSPITAL_COMMUNITY)
Admission: EM | Admit: 2016-04-25 | Discharge: 2016-04-25 | Disposition: A | Discharge status: Home / Self Care | Payer: PPO | Attending: Emergency Medicine | Admitting: Emergency Medicine

## 2016-04-25 ENCOUNTER — Encounter: Payer: Self-pay | Admitting: Internal Medicine

## 2016-04-25 DIAGNOSIS — Z9889 Other specified postprocedural states: Secondary | ICD-10-CM | POA: Diagnosis not present

## 2016-04-25 DIAGNOSIS — I10 Essential (primary) hypertension: Secondary | ICD-10-CM | POA: Diagnosis not present

## 2016-04-25 DIAGNOSIS — E785 Hyperlipidemia, unspecified: Secondary | ICD-10-CM | POA: Diagnosis not present

## 2016-04-25 DIAGNOSIS — Z7982 Long term (current) use of aspirin: Secondary | ICD-10-CM | POA: Diagnosis not present

## 2016-04-25 DIAGNOSIS — Z7952 Long term (current) use of systemic steroids: Secondary | ICD-10-CM | POA: Insufficient documentation

## 2016-04-25 DIAGNOSIS — J449 Chronic obstructive pulmonary disease, unspecified: Secondary | ICD-10-CM | POA: Insufficient documentation

## 2016-04-25 DIAGNOSIS — Z951 Presence of aortocoronary bypass graft: Secondary | ICD-10-CM | POA: Diagnosis not present

## 2016-04-25 DIAGNOSIS — Z86010 Personal history of colonic polyps: Secondary | ICD-10-CM | POA: Diagnosis not present

## 2016-04-25 DIAGNOSIS — Z79899 Other long term (current) drug therapy: Secondary | ICD-10-CM | POA: Diagnosis not present

## 2016-04-25 DIAGNOSIS — I2510 Atherosclerotic heart disease of native coronary artery without angina pectoris: Secondary | ICD-10-CM | POA: Diagnosis not present

## 2016-04-25 DIAGNOSIS — Z792 Long term (current) use of antibiotics: Secondary | ICD-10-CM | POA: Diagnosis not present

## 2016-04-25 DIAGNOSIS — M1990 Unspecified osteoarthritis, unspecified site: Secondary | ICD-10-CM | POA: Insufficient documentation

## 2016-04-25 DIAGNOSIS — G4733 Obstructive sleep apnea (adult) (pediatric): Secondary | ICD-10-CM | POA: Insufficient documentation

## 2016-04-25 DIAGNOSIS — Z9981 Dependence on supplemental oxygen: Secondary | ICD-10-CM | POA: Insufficient documentation

## 2016-04-25 DIAGNOSIS — R600 Localized edema: Secondary | ICD-10-CM

## 2016-04-25 DIAGNOSIS — Z87891 Personal history of nicotine dependence: Secondary | ICD-10-CM | POA: Diagnosis not present

## 2016-04-25 DIAGNOSIS — F419 Anxiety disorder, unspecified: Secondary | ICD-10-CM | POA: Diagnosis not present

## 2016-04-25 DIAGNOSIS — E559 Vitamin D deficiency, unspecified: Secondary | ICD-10-CM | POA: Diagnosis not present

## 2016-04-25 DIAGNOSIS — Z8719 Personal history of other diseases of the digestive system: Secondary | ICD-10-CM | POA: Diagnosis not present

## 2016-04-25 DIAGNOSIS — M7989 Other specified soft tissue disorders: Secondary | ICD-10-CM | POA: Diagnosis not present

## 2016-04-25 LAB — CBC WITH DIFFERENTIAL/PLATELET
BASOS ABS: 0.1 10*3/uL (ref 0.0–0.1)
Basophils Relative: 1 %
EOS PCT: 3 %
Eosinophils Absolute: 0.2 10*3/uL (ref 0.0–0.7)
HEMATOCRIT: 43.9 % (ref 39.0–52.0)
Hemoglobin: 15.2 g/dL (ref 13.0–17.0)
LYMPHS PCT: 22 %
Lymphs Abs: 1.9 10*3/uL (ref 0.7–4.0)
MCH: 33.4 pg (ref 26.0–34.0)
MCHC: 34.6 g/dL (ref 30.0–36.0)
MCV: 96.5 fL (ref 78.0–100.0)
MONO ABS: 0.7 10*3/uL (ref 0.1–1.0)
MONOS PCT: 8 %
Neutro Abs: 5.7 10*3/uL (ref 1.7–7.7)
Neutrophils Relative %: 66 %
PLATELETS: 248 10*3/uL (ref 150–400)
RBC: 4.55 MIL/uL (ref 4.22–5.81)
RDW: 13.1 % (ref 11.5–15.5)
WBC: 8.5 10*3/uL (ref 4.0–10.5)

## 2016-04-25 LAB — COMPREHENSIVE METABOLIC PANEL
ALT: 31 U/L (ref 17–63)
ANION GAP: 11 (ref 5–15)
AST: 34 U/L (ref 15–41)
Albumin: 3.9 g/dL (ref 3.5–5.0)
Alkaline Phosphatase: 63 U/L (ref 38–126)
BILIRUBIN TOTAL: 0.5 mg/dL (ref 0.3–1.2)
BUN: 14 mg/dL (ref 6–20)
CHLORIDE: 103 mmol/L (ref 101–111)
CO2: 25 mmol/L (ref 22–32)
Calcium: 9.5 mg/dL (ref 8.9–10.3)
Creatinine, Ser: 1.04 mg/dL (ref 0.61–1.24)
Glucose, Bld: 142 mg/dL — ABNORMAL HIGH (ref 65–99)
POTASSIUM: 3.3 mmol/L — AB (ref 3.5–5.1)
Sodium: 139 mmol/L (ref 135–145)
TOTAL PROTEIN: 7.3 g/dL (ref 6.5–8.1)

## 2016-04-25 LAB — BRAIN NATRIURETIC PEPTIDE: B NATRIURETIC PEPTIDE 5: 97.5 pg/mL (ref 0.0–100.0)

## 2016-04-25 MED ORDER — FUROSEMIDE 10 MG/ML IJ SOLN
40.0000 mg | Freq: Once | INTRAMUSCULAR | Status: AC
  Administered 2016-04-25: 40 mg via INTRAVENOUS
  Filled 2016-04-25: qty 4

## 2016-04-25 NOTE — ED Provider Notes (Signed)
CSN: [id number]     Arrival date & time 04/25/16  1757 History   First MD Initiated Contact with Patient 04/25/16 1828     Chief Complaint  Patient presents with  . Leg Swelling     (Consider location/radiation/quality/duration/timing/severity/associated sxs/prior Treatment) Patient is a 65 y.o. male presenting with leg pain. The history is provided by the patient.  Leg Pain Location:  Leg and ankle Time since incident:  1 week Injury: no   Pain details:    Quality:  Aching   Radiates to:  Does not radiate   Severity:  Mild   Onset quality:  Gradual   Timing:  Constant   Progression:  Unchanged Chronicity:  New Relieved by:  Elevation Exacerbated by: daily activities. Ineffective treatments: lasix. Associated symptoms: swelling   Associated symptoms: no decreased ROM, no fever and no numbness   Risk factors: obesity     Past Medical History  Diagnosis Date  . History of colonic polyps   . COPD (chronic obstructive pulmonary disease) ([place])   . CAD (coronary artery disease)     Mild plaque (cath "years ago"); abnormal Myoview 04/2013 with subsequent CABG x 5 with LIMA to LAD, SVG to OM1, SVG to DX, SVG to PD & PL.   Marland Kitchen GERD (gastroesophageal reflux disease)   . Hyperlipidemia   . Hypertension   . Vitamin D deficiency   . Meralgia paresthetica of left side 2011  . LBP (low back pain)   . Hx of echocardiogram     Echo (9/15):  EF 55-60%; Gr 2 DD, mild BAE  . Anxiety   . PVD (peripheral vascular disease) ([place])     Stent to left common femoral and right superficial femoral.  2008.  50%  left renal   . 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 Shortness of breath     "once in awhile; can happen at anytime" (08/26/2013)  . Arthritis    Past Surgical History  Procedure Laterality Date  . Lower extremity stents      bilateral lower extremities x 2  . Femoral-popliteal bypass graft  12/27/2012    Procedure: BYPASS GRAFT  FEMORAL-POPLITEAL ARTERY;  Surgeon: Serafina Mitchell, MD;  Location: [facility] OR;  Service: Vascular;  Laterality: Right;  using non-reversed sapphenous vein.  . Cardiac catheterization  05/02/13    x2   . Coronary artery bypass graft N/A 05/06/2013    Procedure: CORONARY ARTERY BYPASS GRAFTING (CABG);  Surgeon: Melrose Nakayama, MD;  Location: [place];  Service: Open Heart Surgery;  Laterality: N/A;  Coronary artery bypass graft times five using left internal mammary artery and left greater saphenous vein via endovein harvest.  . Lower extremity angiogram Bilateral 11/16/2011    Procedure: LOWER EXTREMITY ANGIOGRAM;  Surgeon: Sherren Mocha, MD;  Location: [facility] CATH LAB;  Service: Cardiovascular;  Laterality: Bilateral;  . Abdominal aortagram N/A 11/16/2011    Procedure: ABDOMINAL Maxcine Ham;  Surgeon: Sherren Mocha, MD;  Location: [facility] CATH LAB;  Service: Cardiovascular;  Laterality: N/A;  . Percutaneous stent intervention Left 11/16/2011    Procedure: PERCUTANEOUS STENT INTERVENTION;  Surgeon: Sherren Mocha, MD;  Location: [facility] CATH LAB;  Service: Cardiovascular;  Laterality: Left;  . Abdominal aortagram N/A 11/14/2012    Procedure: ABDOMINAL Maxcine Ham;  Surgeon: Sherren Mocha, MD;  Location: [facility] CATH LAB;  Service: Cardiovascular;  Laterality: N/A;  . Abdominal aortagram N/A 12/30/2014    Procedure: ABDOMINAL AORTAGRAM;  Surgeon: Serafina Mitchell, MD;  Location: [facility] CATH LAB;  Service: Cardiovascular;  Laterality: N/A;  . Endarterectomy femoral Left 01/29/2015    Procedure: ENDARTERECTOMY FEMORAL WITH PATCH ANGIOPLASTY;  Surgeon: Serafina Mitchell, MD;  Location: [place];  Service: Vascular;  Laterality: Left;  . Iliac atherectomy Left 01/29/2015    Procedure: SUPERFICIAL FEMORAL ARTERY ATHERECTOMY/PERCUTANEOUS TRANSLUMINAL ANGIOPLASTY; superficial femoral artery stent;  Surgeon: Serafina Mitchell, MD;  Location: [place];  Service: Vascular;  Laterality: Left;  . Tonsillectomy    . Total hip arthroplasty Left 06/29/2015     Procedure: TOTAL HIP ARTHROPLASTY;  Surgeon: Frederik Pear, MD;  Location: [place];  Service: Orthopedics;  Laterality: Left;  LEFT TOTAL HIP ARTHROPLASTY DEPUY SROM/PINNACLE  . Eye surgery  03/24/16    cataract surgery on left eye   Family History  Problem Relation Age of Onset  . Heart disease Mother     No clear CAD and Heart Disease before age 67  . Hypertension Mother   . Cancer Mother     ? colon ca  . Hyperlipidemia Mother   . Cancer Father     brain tumor  . Alzheimer's disease Father   . Colon cancer Neg Hx   . Heart attack Neg Hx    Social History  Substance Use Topics  . Smoking status: Former Smoker -- 2.00 packs/day for 47 years    Types: Cigarettes    Quit date: 12/17/2012  . Smokeless tobacco: Never Used  . Alcohol Use: 2.4 oz/week    4 Shots of liquor per week     Comment: 08/26/2013 "3-4 mixed drinks/wk"    Review of Systems  Constitutional: Negative for fever.  All other systems reviewed and are negative.     Allergies  Roxicodone; Benazepril; and Itraconazole  Home Medications   Prior to Admission medications   Medication Sig Start Date End Date Taking? Authorizing Provider  albuterol (PROVENTIL HFA;VENTOLIN HFA) 108 (90 Base) MCG/ACT inhaler Inhale 2 puffs into the lungs every 6 (six) hours as needed for wheezing or shortness of breath. 04/04/16   Betty G Martinique, MD  amLODipine (NORVASC) 10 MG tablet Take 1 tablet (10 mg total) by mouth daily. 12/09/15   Aleksei Plotnikov V, MD  amoxicillin (AMOXIL) 875 MG tablet Take 1 tablet (875 mg total) by mouth 2 (two) times daily. 04/12/16   Betty G Martinique, MD  aspirin EC 325 MG tablet Take 1 tablet (325 mg total) by mouth 2 (two) times daily. 06/29/15   Leighton Parody, PA-C  atenolol (TENORMIN) 50 MG tablet TAKE 1 TABLET BY MOUTH EVERY DAY 09/17/15   Aleksei Plotnikov V, MD  atorvastatin (LIPITOR) 20 MG tablet TAKE 1 TABLET BY MOUTH EVERY DAY 12/31/15   Aleksei Plotnikov V, MD  azelastine (ASTELIN) 0.1 % nasal spray  Place 1 spray into both nostrils 2 (two) times daily. Use in each nostril as directed 04/12/16   Betty G Martinique, MD  cholecalciferol (VITAMIN D) 1000 units tablet Take 2 tablets (2,000 Units total) by mouth daily. 03/01/16   Aleksei Plotnikov V, MD  clonazePAM (KLONOPIN) 0.5 MG tablet Take 1 tablet (0.5 mg total) by mouth 2 (two) times daily as needed for anxiety. 01/16/16   Aleksei Plotnikov V, MD  DUREZOL 0.05 % EMUL Reported on 02/26/2016 02/22/16   Historical Provider, MD  fluticasone (FLONASE) 50 MCG/ACT nasal spray Place 1 spray into both nostrils 2 (two) times daily. 04/04/16   Betty G Martinique, MD  hydrALAZINE (APRESOLINE) 50  MG tablet TAKE 1 TABLET BY MOUTH THREE TIMES DAILY Patient taking differently: TAKE 50 MG BY MOUTH THREE TIMES DAILY 03/26/15   Aleksei Plotnikov V, MD  METRONIDAZOLE, TOPICAL, 0.75 % LOTN On face qd 07/22/15   Aleksei Plotnikov V, MD  naproxen sodium (ANAPROX) 220 MG tablet Take 440 mg by mouth as needed (for pain). Reported on 04/04/2016    Historical Provider, MD  vitamin B-12 (CYANOCOBALAMIN) 1000 MCG tablet Take 1,000 mcg by mouth daily.    Historical Provider, MD   BP 154/79 mmHg  Pulse 73  Temp(Src) 98.6 F (37 C) (Oral)  Resp 23  SpO2 97% Physical Exam  Constitutional: He is oriented to person, place, and time. He appears well-developed and well-nourished. No distress.  HENT:  Head: Normocephalic and atraumatic.  Eyes: Conjunctivae are normal.  Neck: Neck supple. No tracheal deviation present.  Cardiovascular: Normal rate, regular rhythm and normal heart sounds.   Pulses:      Dorsalis pedis pulses are 2+ on the right side, and 2+ on the left side.  Pulmonary/Chest: Effort normal and breath sounds normal. No respiratory distress.  Abdominal: Soft. He exhibits no distension. There is no tenderness.  Musculoskeletal:  2+ b/l LE pitting edema to ankles  Neurological: He is alert and oriented to person, place, and time.  Skin: Skin is warm and dry.  Psychiatric: He  has a normal mood and affect.  Vitals reviewed.   ED Course  Procedures (including critical care time) Labs Review Labs Reviewed  COMPREHENSIVE METABOLIC PANEL - Abnormal; Notable for the following:    Potassium 3.3 (*)    Glucose, Bld 142 (*)    All other components within normal limits  CBC WITH DIFFERENTIAL/PLATELET  BRAIN NATRIURETIC PEPTIDE    Imaging Review No results found. I have personally reviewed and evaluated these images and lab results as part of my medical decision-making.   EKG Interpretation None      MDM   Final diagnoses:  Bilateral edema of lower extremity   65 y.o. male presents with bilateral lower extremity pitting edema. Has h/o grafting in lower extremity arterial circulation. Had workup at another ED including b/l LE dopplers and CT with patent grafts. Good arterial circulation noted on exam here. Took lasix but did not diurese at all. No evidence of DVT clinically, BNP not elevated and no h/o CHF. Recommended compression stockings, given dose of IV lasix here to help with diuresing some fluid. Most likely venous stasis related, recommended close f/u with PCP for further OP workup if not responding to supportive care measures.     Leo Grosser, MD 04/26/16 [phone]

## 2016-04-25 NOTE — ED Notes (Signed)
He c/o BLE swelling 2 weeks ago. He went to another hospital and they started him on lasix for peripheral edema but the swelling is not getting better. He denies any sob, cp, cough, fevers. He reports hx of stents to BLE. He is alert and breathing easily

## 2016-04-25 NOTE — Discharge Instructions (Signed)

## 2016-04-26 DIAGNOSIS — H2511 Age-related nuclear cataract, right eye: Secondary | ICD-10-CM | POA: Diagnosis not present

## 2016-04-28 ENCOUNTER — Encounter: Payer: Self-pay | Admitting: Surgery

## 2016-04-28 DIAGNOSIS — H2511 Age-related nuclear cataract, right eye: Secondary | ICD-10-CM | POA: Diagnosis not present

## 2016-04-29 ENCOUNTER — Encounter: Payer: Self-pay | Admitting: Internal Medicine

## 2016-04-29 ENCOUNTER — Other Ambulatory Visit: Payer: Self-pay | Admitting: *Deleted

## 2016-04-29 ENCOUNTER — Ambulatory Visit (INDEPENDENT_AMBULATORY_CARE_PROVIDER_SITE_OTHER): Payer: PPO | Admitting: Internal Medicine

## 2016-04-29 VITALS — BP 140/60 | HR 75 | Temp 98.3°F | Wt 188.0 lb

## 2016-04-29 DIAGNOSIS — R600 Localized edema: Secondary | ICD-10-CM

## 2016-04-29 MED ORDER — AMLODIPINE BESYLATE 10 MG PO TABS: 5.0000 mg | ORAL_TABLET | Freq: Every day | ORAL | Status: DC

## 2016-04-29 MED ORDER — HYDRALAZINE HCL 50 MG PO TABS: ORAL_TABLET | ORAL | Status: DC

## 2016-04-29 MED ORDER — POTASSIUM CHLORIDE ER 8 MEQ PO TBCR: 8.0000 meq | EXTENDED_RELEASE_TABLET | Freq: Every day | ORAL | Status: DC

## 2016-04-29 MED ORDER — CLONAZEPAM 0.5 MG PO TABS: 0.5000 mg | ORAL_TABLET | Freq: Two times a day (BID) | ORAL | Status: DC | PRN

## 2016-04-29 NOTE — Progress Notes (Signed)
Subjective:  Patient ID: Douglas Christensen, male    DOB: [birth year]  Age: 65 y.o. MRN: [id number]  CC: No chief complaint on file.   HPI Douglas Christensen presents for L edema x 1 month. Pt went to [facility] (-) venous doppler, nl CXR and labs and were ok. K was low a little.  Outpatient Prescriptions Prior to Visit  Medication Sig Dispense Refill  . albuterol (PROVENTIL HFA;VENTOLIN HFA) 108 (90 Base) MCG/ACT inhaler Inhale 2 puffs into the lungs every 6 (six) hours as needed for wheezing or shortness of breath. 1 Inhaler 0  . amLODipine (NORVASC) 10 MG tablet Take 1 tablet (10 mg total) by mouth daily. 90 tablet 1  . amoxicillin (AMOXIL) 875 MG tablet Take 1 tablet (875 mg total) by mouth 2 (two) times daily. 20 tablet 0  . aspirin EC 325 MG tablet Take 1 tablet (325 mg total) by mouth 2 (two) times daily. 30 tablet 0  . atenolol (TENORMIN) 50 MG tablet TAKE 1 TABLET BY MOUTH EVERY DAY 90 tablet 3  . atorvastatin (LIPITOR) 20 MG tablet TAKE 1 TABLET BY MOUTH EVERY DAY 90 tablet 2  . azelastine (ASTELIN) 0.1 % nasal spray Place 1 spray into both nostrils 2 (two) times daily. Use in each nostril as directed 30 mL 2  . cholecalciferol (VITAMIN D) 1000 units tablet Take 2 tablets (2,000 Units total) by mouth daily. 100 tablet 3  . clonazePAM (KLONOPIN) 0.5 MG tablet Take 1 tablet (0.5 mg total) by mouth 2 (two) times daily as needed for anxiety. 60 tablet 2  . fluticasone (FLONASE) 50 MCG/ACT nasal spray Place 1 spray into both nostrils 2 (two) times daily. 16 g 3  . hydrALAZINE (APRESOLINE) 50 MG tablet TAKE 50 MG BY MOUTH THREE TIMES DAILY 270 tablet 2  . METRONIDAZOLE, TOPICAL, 0.75 % LOTN On face qd 59 mL 3  . vitamin B-12 (CYANOCOBALAMIN) 1000 MCG tablet Take 1,000 mcg by mouth daily.     Facility-Administered Medications Prior to Visit  Medication Dose Route Frequency Provider Last Rate Last Dose  . ipratropium-albuterol (DUONEB) 0.5-2.5 (3) MG/3ML nebulizer solution 3 mL  3 mL  Nebulization Once Janith Lima, MD        ROS Review of Systems  Constitutional: Negative for appetite change, fatigue and unexpected weight change.  HENT: Negative for congestion, nosebleeds, sneezing, sore throat and trouble swallowing.   Eyes: Negative for itching and visual disturbance.  Respiratory: Negative for cough.   Cardiovascular: Positive for leg swelling. Negative for chest pain and palpitations.  Gastrointestinal: Negative for nausea, diarrhea, blood in stool and abdominal distention.  Genitourinary: Negative for frequency and hematuria.  Musculoskeletal: Positive for back pain. Negative for joint swelling, gait problem and neck pain.  Skin: Negative for rash.  Neurological: Negative for dizziness, tremors, speech difficulty and weakness.  Psychiatric/Behavioral: Negative for sleep disturbance, dysphoric mood and agitation. The patient is not nervous/anxious.     Objective:  BP 140/60 mmHg  Pulse 75  Temp(Src) 98.3 F (36.8 C) (Oral)  Wt 188 lb (85.276 kg)  SpO2 95%  BP Readings from Last 3 Encounters:  04/29/16 140/60  04/25/16 136/62  04/12/16 132/58    Wt Readings from Last 3 Encounters:  04/29/16 188 lb (85.276 kg)  04/12/16 188 lb 5 oz (85.418 kg)  04/04/16 185 lb 3 oz (84 kg)    Physical Exam  Constitutional: He is oriented to person, place, and time. He appears well-developed. No  distress.  NAD  HENT:  Mouth/Throat: Oropharynx is clear and moist.  Eyes: Conjunctivae are normal. Pupils are equal, round, and reactive to light.  Neck: Normal range of motion. No JVD present. No thyromegaly present.  Cardiovascular: Normal rate, regular rhythm, normal heart sounds and intact distal pulses.  Exam reveals no gallop and no friction rub.   No murmur heard. Pulmonary/Chest: Effort normal and breath sounds normal. No respiratory distress. He has no wheezes. He has no rales. He exhibits no tenderness.  Abdominal: Soft. Bowel sounds are normal. He exhibits no  distension and no mass. There is no tenderness. There is no rebound and no guarding.  Musculoskeletal: Normal range of motion. He exhibits edema. He exhibits no tenderness.  Lymphadenopathy:    He has no cervical adenopathy.  Neurological: He is alert and oriented to person, place, and time. He has normal reflexes. No cranial nerve deficit. He exhibits normal muscle tone. He displays a negative Romberg sign. Coordination and gait normal.  Skin: Skin is warm and dry. No rash noted.  Psychiatric: He has a normal mood and affect. His behavior is normal. Judgment and thought content normal.  Trace edema B ankles  Lab Results  Component Value Date   WBC 8.5 04/25/2016   HGB 15.2 04/25/2016   HCT 43.9 04/25/2016   PLT 248 04/25/2016   GLUCOSE 142* 04/25/2016   CHOL 185 03/01/2016   TRIG 148.0 03/01/2016   HDL 38.90* 03/01/2016   LDLDIRECT 138.6 11/05/2014   LDLCALC 117* 03/01/2016   ALT 31 04/25/2016   AST 34 04/25/2016   NA 139 04/25/2016   K 3.3* 04/25/2016   CL 103 04/25/2016   CREATININE 1.04 04/25/2016   BUN 14 04/25/2016   CO2 25 04/25/2016   TSH 2.67 03/01/2016   PSA 0.61 03/01/2016   INR 1.10 06/22/2015   HGBA1C 5.8* 05/03/2013    No results found.  Assessment & Plan:   There are no diagnoses linked to this encounter. I am having Douglas Christensen maintain his vitamin B-12, aspirin EC, METRONIDAZOLE (TOPICAL), atenolol, amLODipine, atorvastatin, cholecalciferol, albuterol, fluticasone, amoxicillin, azelastine, hydrALAZINE, and clonazePAM. We will continue to administer ipratropium-albuterol.  No orders of the defined types were placed in this encounter.     Follow-up: No Follow-up on file.  [name], MD

## 2016-04-29 NOTE — Telephone Encounter (Signed)
Receive fax pt requesting renewal on his Clonazepam.../lmb

## 2016-04-29 NOTE — Telephone Encounter (Signed)
Faxed to [facility]...[name]

## 2016-04-29 NOTE — Assessment & Plan Note (Signed)
6/17 due to chronic venous insufficiency and Norvasc 10 mg Reduce Norvasc to 5 mg/d Compr hose Bed foam wedge Lasix prn

## 2016-04-29 NOTE — Telephone Encounter (Signed)
OK to fill this prescription with additional refills x2 Thank you!  

## 2016-04-29 NOTE — Progress Notes (Signed)
Pre visit review using our clinic review tool, if applicable. No additional management support is needed unless otherwise documented below in the visit note. 

## 2016-04-29 NOTE — Patient Instructions (Signed)
Foam wedge for bed

## 2016-05-02 ENCOUNTER — Ambulatory Visit (INDEPENDENT_AMBULATORY_CARE_PROVIDER_SITE_OTHER): Payer: PPO | Admitting: Surgery

## 2016-05-02 VITALS — BP 112/62 | HR 55 | Temp 97.3°F | Resp 16 | Ht 65.0 in | Wt 188.0 lb

## 2016-05-02 DIAGNOSIS — I70213 Atherosclerosis of native arteries of extremities with intermittent claudication, bilateral legs: Secondary | ICD-10-CM

## 2016-05-02 NOTE — Progress Notes (Signed)
[facility]ist [facility]  Patient name: Douglas Christensen MRN: [id number] DOB: Apr 04, 1951 Sex: male  REASON FOR VISIT: follow up  HPI: The patient is back today for follow-up. On 01/29/2015, he underwent femoral endarterectomy with vein patch angioplasty, followed by atherectomy and stenting of his left superficial femoral artery. This was done for claudication. Overlapping 7 mm Cordis stents were placed. The patient did well postoperatively and was discharged to home.He has a history of right femoral to above-knee popliteal artery bypass graft with vein which was performed on 12/10/2012. He is taking a full dose aspirin currently as he had difficulty taking Plavix.  The patient was [place] at the beach and developed bilateral leg swelling.  There was no inciting factor.  He has been wearing compression stockings.  He has no ulcers.  His swelling is better with elevation.  He does not have claudication symptoms.  He underwent a CT scan while at the beach which showed patent right femoral-popliteal bypass graft and patent left femoral endarterectomy with stenting of the left superficial femoral artery.  There was no evidence of DVT.   Past Medical History  Diagnosis Date  . History of colonic polyps   . COPD (chronic obstructive pulmonary disease) ([place])   . CAD (coronary artery disease)     Mild plaque (cath "years ago"); abnormal Myoview 04/2013 with subsequent CABG x 5 with LIMA to LAD, SVG to OM1, SVG to DX, SVG to PD & PL.   Marland Kitchen GERD (gastroesophageal reflux disease)   . Hyperlipidemia   . Hypertension   . Vitamin D deficiency   . Meralgia paresthetica of left side 2011  . LBP (low back pain)   . Hx of echocardiogram     Echo (9/15):  EF 55-60%; Gr 2 DD, mild BAE  . Anxiety   . PVD (peripheral vascular disease) ([place])     Stent to left common femoral and right superficial femoral.  2008.  50%  left renal   . Sleep apnea     mod OSA,  central sleep apnea/hypoapnea syndrome 11/22/12, CPAP every night   . Shortness of breath     "once in awhile; can happen at anytime" (08/26/2013)  . Arthritis     Family History  Problem Relation Age of Onset  . Heart disease Mother     No clear CAD and Heart Disease before age 90  . Hypertension Mother   . Cancer Mother     ? colon ca  . Hyperlipidemia Mother   . Cancer Father     brain tumor  . Alzheimer's disease Father   . Colon cancer Neg Hx   . Heart attack Neg Hx     SOCIAL HISTORY: Social History  Substance Use Topics  . Smoking status: Former Smoker -- 2.00 packs/day for 47 years    Types: Cigarettes    Quit date: 12/17/2012  . Smokeless tobacco: Never Used  . Alcohol Use: 2.4 oz/week    4 Shots of liquor per week     Comment: 08/26/2013 "3-4 mixed drinks/wk"    Allergies  Allergen Reactions  . Roxicodone [Oxycodone Hcl] Other (See Comments)    hallucinations  . Benazepril Cough  . Itraconazole Nausea Only and Rash    Current Outpatient Prescriptions  Medication Sig Dispense Refill  . albuterol (PROVENTIL HFA;VENTOLIN HFA) 108 (90 Base) MCG/ACT inhaler Inhale 2 puffs into the lungs every 6 (six) hours as needed for wheezing or shortness of breath. 1 Inhaler 0  .  amLODipine (NORVASC) 10 MG tablet Take 0.5 tablets (5 mg total) by mouth daily. 90 tablet 3  . aspirin EC 325 MG tablet Take 1 tablet (325 mg total) by mouth 2 (two) times daily. 30 tablet 0  . atenolol (TENORMIN) 50 MG tablet TAKE 1 TABLET BY MOUTH EVERY DAY 90 tablet 3  . atorvastatin (LIPITOR) 20 MG tablet TAKE 1 TABLET BY MOUTH EVERY DAY 90 tablet 2  . azelastine (ASTELIN) 0.1 % nasal spray Place 1 spray into both nostrils 2 (two) times daily. Use in each nostril as directed 30 mL 2  . cholecalciferol (VITAMIN D) 1000 units tablet Take 2 tablets (2,000 Units total) by mouth daily. 100 tablet 3  . clonazePAM (KLONOPIN) 0.5 MG tablet Take 1 tablet (0.5 mg total) by mouth 2 (two) times daily as  needed for anxiety. 60 tablet 2  . fluticasone (FLONASE) 50 MCG/ACT nasal spray Place 1 spray into both nostrils 2 (two) times daily. 16 g 3  . hydrALAZINE (APRESOLINE) 50 MG tablet TAKE 50 MG BY MOUTH THREE TIMES DAILY 270 tablet 2  . METRONIDAZOLE, TOPICAL, 0.75 % LOTN On face qd 59 mL 3  . potassium chloride (KLOR-CON) 8 MEQ tablet Take 1 tablet (8 mEq total) by mouth daily. 30 tablet 0  . vitamin B-12 (CYANOCOBALAMIN) 1000 MCG tablet Take 1,000 mcg by mouth daily.     Current Facility-Administered Medications  Medication Dose Route Frequency Provider Last Rate Last Dose  . ipratropium-albuterol (DUONEB) 0.5-2.5 (3) MG/3ML nebulizer solution 3 mL  3 mL Nebulization Once Janith Lima, MD        REVIEW OF SYSTEMS:  [X]  denotes positive finding, [ ]  denotes negative finding Cardiac  Comments:  Chest pain or chest pressure:    Shortness of breath upon exertion:    Short of breath when lying flat:    Irregular heart rhythm:        Vascular    Pain in calf, thigh, or hip brought on by ambulation:    Pain in feet at night that wakes you up from your sleep:     Blood clot in your veins:    Leg swelling:  x       Pulmonary    Oxygen at home:    Productive cough:     Wheezing:         Neurologic    Sudden weakness in arms or legs:     Sudden numbness in arms or legs:     Sudden onset of difficulty speaking or slurred speech:    Temporary loss of vision in one eye:     Problems with dizziness:         Gastrointestinal    Blood in stool:     Vomited blood:         Genitourinary    Burning when urinating:     Blood in urine:        Psychiatric    Major depression:         Hematologic    Bleeding problems:    Problems with blood clotting too easily:        Skin    Rashes or ulcers:        Constitutional    Fever or chills:      PHYSICAL EXAM: Filed Vitals:   05/02/16 0953  BP: 112/62  Pulse: 55  Temp: 97.3 F (36.3 C)  TempSrc: Oral  Resp: 16  Height: 5\' 5"   (1.651 m)  Weight: 188 lb (85.276 kg)  SpO2: 96%    GENERAL: The patient is a well-nourished male, in no acute distress. The vital signs are documented above. CARDIAC: There is a regular rate and rhythm.  VASCULAR: Palpable dorsalis pedis pulse bilaterally PULMONARY: There is good air exchange bilaterally without wheezing or rales. ABDOMEN: Soft and non-tender with normal pitched bowel sounds.  MUSCULOSKELETAL: There are no major deformities or cyanosis. NEUROLOGIC: No focal weakness or paresthesias are detected. SKIN: There are no ulcers or rashes noted. PSYCHIATRIC: The patient has a normal affect.  DATA:  Report from CT scan shows a patent right femoral-popliteal bypass graft and a patent left femoral endarterectomy with left superficial femoral artery stenting  MEDICAL ISSUES: The patient is doing well from my perspective.  I'll have him follow up in 6 months with bilateral lower extremity duplex to evaluate his interventions.  I have encouraged him to continue wearing compression stockings for his leg swelling.  I do not think he needs to undergo additional venous insufficiency testing as he has artery had his saphenous vein removed bilaterally.    [name], MD [facility] of [facility] [phone] Pager [phone]

## 2016-05-30 ENCOUNTER — Other Ambulatory Visit: Payer: Self-pay | Admitting: *Deleted

## 2016-05-30 MED ORDER — ATENOLOL 50 MG PO TABS: 50.0000 mg | ORAL_TABLET | Freq: Every day | ORAL | Status: DC

## 2016-06-02 ENCOUNTER — Other Ambulatory Visit: Payer: Self-pay | Admitting: Internal Medicine

## 2016-06-23 DIAGNOSIS — H2511 Age-related nuclear cataract, right eye: Secondary | ICD-10-CM | POA: Diagnosis not present

## 2016-06-23 DIAGNOSIS — H2512 Age-related nuclear cataract, left eye: Secondary | ICD-10-CM | POA: Diagnosis not present

## 2016-06-28 ENCOUNTER — Other Ambulatory Visit: Payer: Self-pay | Admitting: *Deleted

## 2016-06-28 DIAGNOSIS — I739 Peripheral vascular disease, unspecified: Secondary | ICD-10-CM

## 2016-06-28 MED ORDER — ATORVASTATIN CALCIUM 20 MG PO TABS: 20.0000 mg | ORAL_TABLET | Freq: Every day | ORAL | 2 refills | Status: DC

## 2016-07-26 ENCOUNTER — Other Ambulatory Visit: Payer: Self-pay | Admitting: *Deleted

## 2016-08-02 DIAGNOSIS — G4730 Sleep apnea, unspecified: Secondary | ICD-10-CM | POA: Diagnosis not present

## 2016-08-29 ENCOUNTER — Ambulatory Visit: Payer: PPO | Admitting: Internal Medicine

## 2016-08-31 ENCOUNTER — Other Ambulatory Visit: Payer: Self-pay | Admitting: *Deleted

## 2016-08-31 MED ORDER — AMLODIPINE BESYLATE 5 MG PO TABS: 5.0000 mg | ORAL_TABLET | Freq: Every day | ORAL | 0 refills | Status: DC

## 2016-08-31 NOTE — Telephone Encounter (Signed)
Rec'd fax pt requesting refill on Clonazepam 0.5 mg. Last filled 07/26/16.../l;mb

## 2016-09-05 ENCOUNTER — Other Ambulatory Visit (INDEPENDENT_AMBULATORY_CARE_PROVIDER_SITE_OTHER): Payer: PPO

## 2016-09-05 ENCOUNTER — Encounter: Payer: Self-pay | Admitting: Internal Medicine

## 2016-09-05 ENCOUNTER — Ambulatory Visit (INDEPENDENT_AMBULATORY_CARE_PROVIDER_SITE_OTHER): Payer: PPO | Admitting: Internal Medicine

## 2016-09-05 VITALS — BP 120/60 | HR 65 | Temp 98.2°F | Wt 193.0 lb

## 2016-09-05 DIAGNOSIS — E538 Deficiency of other specified B group vitamins: Secondary | ICD-10-CM

## 2016-09-05 DIAGNOSIS — Z23 Encounter for immunization: Secondary | ICD-10-CM | POA: Diagnosis not present

## 2016-09-05 DIAGNOSIS — I2510 Atherosclerotic heart disease of native coronary artery without angina pectoris: Secondary | ICD-10-CM | POA: Diagnosis not present

## 2016-09-05 DIAGNOSIS — D751 Secondary polycythemia: Secondary | ICD-10-CM

## 2016-09-05 DIAGNOSIS — E785 Hyperlipidemia, unspecified: Secondary | ICD-10-CM

## 2016-09-05 DIAGNOSIS — Z9989 Dependence on other enabling machines and devices: Secondary | ICD-10-CM

## 2016-09-05 DIAGNOSIS — M5440 Lumbago with sciatica, unspecified side: Secondary | ICD-10-CM

## 2016-09-05 DIAGNOSIS — E559 Vitamin D deficiency, unspecified: Secondary | ICD-10-CM

## 2016-09-05 DIAGNOSIS — G4733 Obstructive sleep apnea (adult) (pediatric): Secondary | ICD-10-CM | POA: Diagnosis not present

## 2016-09-05 DIAGNOSIS — I739 Peripheral vascular disease, unspecified: Secondary | ICD-10-CM

## 2016-09-05 LAB — URINALYSIS
Bilirubin Urine: NEGATIVE
Hgb urine dipstick: NEGATIVE
Ketones, ur: NEGATIVE
LEUKOCYTES UA: NEGATIVE
NITRITE: NEGATIVE
Specific Gravity, Urine: 1.025 (ref 1.000–1.030)
Total Protein, Urine: NEGATIVE
Urine Glucose: NEGATIVE
Urobilinogen, UA: 0.2 (ref 0.0–1.0)
pH: 5.5 (ref 5.0–8.0)

## 2016-09-05 LAB — CBC WITH DIFFERENTIAL/PLATELET
BASOS ABS: 0.1 10*3/uL (ref 0.0–0.1)
BASOS PCT: 0.8 % (ref 0.0–3.0)
EOS ABS: 0.2 10*3/uL (ref 0.0–0.7)
Eosinophils Relative: 2.2 % (ref 0.0–5.0)
HEMATOCRIT: 43 % (ref 39.0–52.0)
Hemoglobin: 14.9 g/dL (ref 13.0–17.0)
LYMPHS ABS: 2.2 10*3/uL (ref 0.7–4.0)
Lymphocytes Relative: 21.6 % (ref 12.0–46.0)
MCHC: 34.6 g/dL (ref 30.0–36.0)
MCV: 96.4 fl (ref 78.0–100.0)
Monocytes Absolute: 0.9 10*3/uL (ref 0.1–1.0)
Monocytes Relative: 8.8 % (ref 3.0–12.0)
NEUTROS ABS: 6.7 10*3/uL (ref 1.4–7.7)
NEUTROS PCT: 66.6 % (ref 43.0–77.0)
PLATELETS: 228 10*3/uL (ref 150.0–400.0)
RBC: 4.46 Mil/uL (ref 4.22–5.81)
RDW: 14.3 % (ref 11.5–15.5)
WBC: 10.1 10*3/uL (ref 4.0–10.5)

## 2016-09-05 LAB — HEPATIC FUNCTION PANEL
ALT: 54 U/L — AB (ref 0–53)
AST: 58 U/L — AB (ref 0–37)
Albumin: 3.9 g/dL (ref 3.5–5.2)
Alkaline Phosphatase: 58 U/L (ref 39–117)
BILIRUBIN DIRECT: 0.1 mg/dL (ref 0.0–0.3)
BILIRUBIN TOTAL: 0.7 mg/dL (ref 0.2–1.2)
Total Protein: 7 g/dL (ref 6.0–8.3)

## 2016-09-05 LAB — BASIC METABOLIC PANEL
BUN: 13 mg/dL (ref 6–23)
CHLORIDE: 106 meq/L (ref 96–112)
CO2: 26 mEq/L (ref 19–32)
CREATININE: 0.95 mg/dL (ref 0.40–1.50)
Calcium: 9.2 mg/dL (ref 8.4–10.5)
GFR: 84.43 mL/min (ref >60.00)
Glucose, Bld: 87 mg/dL (ref 70–99)
POTASSIUM: 3.8 meq/L (ref 3.5–5.1)
Sodium: 142 mEq/L (ref 135–145)

## 2016-09-05 LAB — TSH: TSH: 2.73 u[IU]/mL (ref 0.35–4.50)

## 2016-09-05 MED ORDER — AVANAFIL 100 MG PO TABS: 100.0000 mg | ORAL_TABLET | Freq: Every day | ORAL | 5 refills | Status: DC | PRN

## 2016-09-05 NOTE — Addendum Note (Signed)
Addended by: Cresenciano Lick on: 09/05/2016 03:40 PM   Modules accepted: Orders

## 2016-09-05 NOTE — Assessment & Plan Note (Signed)
On B12 

## 2016-09-05 NOTE — Assessment & Plan Note (Signed)
Vit D 

## 2016-09-05 NOTE — Progress Notes (Signed)
Subjective:  Patient ID: Douglas Christensen, male    DOB: 1951/11/27  Age: 65 y.o. MRN: [id number]  CC: No chief complaint on file.   HPI Douglas Christensen presents for HTN, CAD, dyslipidemia f/u. C/o ED  Outpatient Medications Prior to Visit  Medication Sig Dispense Refill  . albuterol (PROVENTIL HFA;VENTOLIN HFA) 108 (90 Base) MCG/ACT inhaler Inhale 2 puffs into the lungs every 6 (six) hours as needed for wheezing or shortness of breath. 1 Inhaler 0  . amLODipine (NORVASC) 5 MG tablet Take 1 tablet (5 mg total) by mouth daily. Keep oct appt for future refills 90 tablet 0  . aspirin EC 325 MG tablet Take 1 tablet (325 mg total) by mouth 2 (two) times daily. 30 tablet 0  . atenolol (TENORMIN) 50 MG tablet Take 1 tablet (50 mg total) by mouth daily. 90 tablet 2  . atorvastatin (LIPITOR) 20 MG tablet Take 1 tablet (20 mg total) by mouth daily. 90 tablet 2  . cholecalciferol (VITAMIN D) 1000 units tablet Take 2 tablets (2,000 Units total) by mouth daily. 100 tablet 3  . clonazePAM (KLONOPIN) 0.5 MG tablet Take 1 tablet (0.5 mg total) by mouth 2 (two) times daily as needed for anxiety. 60 tablet 2  . fluticasone (FLONASE) 50 MCG/ACT nasal spray Place 1 spray into both nostrils 2 (two) times daily. 16 g 3  . hydrALAZINE (APRESOLINE) 50 MG tablet TAKE 50 MG BY MOUTH THREE TIMES DAILY 270 tablet 2  . METRONIDAZOLE, TOPICAL, 0.75 % LOTN On face qd 59 mL 3  . vitamin B-12 (CYANOCOBALAMIN) 1000 MCG tablet Take 1,000 mcg by mouth daily.    Marland Kitchen azelastine (ASTELIN) 0.1 % nasal spray Place 1 spray into both nostrils 2 (two) times daily. Use in each nostril as directed (Patient not taking: Reported on 09/05/2016) 30 mL 2  . potassium chloride (KLOR-CON) 8 MEQ tablet Take 1 tablet (8 mEq total) by mouth daily. (Patient not taking: Reported on 09/05/2016) 30 tablet 0   Facility-Administered Medications Prior to Visit  Medication Dose Route Frequency Provider Last Rate Last Dose  . ipratropium-albuterol (DUONEB) 0.5-2.5  (3) MG/3ML nebulizer solution 3 mL  3 mL Nebulization Once Janith Lima, MD        ROS Review of Systems  Constitutional: Positive for unexpected weight change. Negative for appetite change and fatigue.  HENT: Negative for congestion, nosebleeds, sneezing, sore throat and trouble swallowing.   Eyes: Negative for itching and visual disturbance.  Respiratory: Negative for cough.   Cardiovascular: Negative for chest pain, palpitations and leg swelling.  Gastrointestinal: Negative for abdominal distention, blood in stool, diarrhea and nausea.  Genitourinary: Negative for frequency and hematuria.  Musculoskeletal: Negative for back pain, gait problem, joint swelling and neck pain.  Skin: Negative for rash.  Neurological: Negative for dizziness, tremors, speech difficulty and weakness.  Psychiatric/Behavioral: Negative for agitation, dysphoric mood and sleep disturbance. The patient is not nervous/anxious.     Objective:  BP 120/60   Pulse 65   Temp 98.2 F (36.8 C) (Oral)   Wt 193 lb (87.5 kg)   SpO2 97%   BMI 32.12 kg/m   BP Readings from Last 3 Encounters:  09/05/16 120/60  05/02/16 112/62  04/29/16 140/60    Wt Readings from Last 3 Encounters:  09/05/16 193 lb (87.5 kg)  05/02/16 188 lb (85.3 kg)  04/29/16 188 lb (85.3 kg)    Physical Exam  Constitutional: He is oriented to person, place, and time. He appears  well-developed. No distress.  NAD  HENT:  Mouth/Throat: Oropharynx is clear and moist.  Eyes: Conjunctivae are normal. Pupils are equal, round, and reactive to light.  Neck: Normal range of motion. No JVD present. No thyromegaly present.  Cardiovascular: Normal rate, regular rhythm, normal heart sounds and intact distal pulses.  Exam reveals no gallop and no friction rub.   No murmur heard. Pulmonary/Chest: Effort normal and breath sounds normal. No respiratory distress. He has no wheezes. He has no rales. He exhibits no tenderness.  Abdominal: Soft. Bowel  sounds are normal. He exhibits no distension and no mass. There is no tenderness. There is no rebound and no guarding.  Musculoskeletal: Normal range of motion. He exhibits no edema or tenderness.  Lymphadenopathy:    He has no cervical adenopathy.  Neurological: He is alert and oriented to person, place, and time. He has normal reflexes. No cranial nerve deficit. He exhibits normal muscle tone. He displays a negative Romberg sign. Coordination and gait normal.  Skin: Skin is warm and dry. No rash noted.  Psychiatric: He has a normal mood and affect. His behavior is normal. Judgment and thought content normal.    Lab Results  Component Value Date   WBC 8.5 04/25/2016   HGB 15.2 04/25/2016   HCT 43.9 04/25/2016   PLT 248 04/25/2016   GLUCOSE 142 (H) 04/25/2016   CHOL 185 03/01/2016   TRIG 148.0 03/01/2016   HDL 38.90 (L) 03/01/2016   LDLDIRECT 138.6 11/05/2014   LDLCALC 117 (H) 03/01/2016   ALT 31 04/25/2016   AST 34 04/25/2016   NA 139 04/25/2016   K 3.3 (L) 04/25/2016   CL 103 04/25/2016   CREATININE 1.04 04/25/2016   BUN 14 04/25/2016   CO2 25 04/25/2016   TSH 2.67 03/01/2016   PSA 0.61 03/01/2016   INR 1.10 06/22/2015   HGBA1C 5.8 (H) 05/03/2013    No results found.  Assessment & Plan:   There are no diagnoses linked to this encounter. I am having Mr. Hemann maintain his vitamin B-12, aspirin EC, METRONIDAZOLE (TOPICAL), cholecalciferol, albuterol, fluticasone, azelastine, hydrALAZINE, clonazePAM, potassium chloride, atenolol, atorvastatin, and amLODipine. We will continue to administer ipratropium-albuterol.  No orders of the defined types were placed in this encounter.    Follow-up: No Follow-up on file.  [name], MD

## 2016-09-05 NOTE — Assessment & Plan Note (Signed)
Doing well 

## 2016-09-05 NOTE — Assessment & Plan Note (Signed)
On CPAP. ?

## 2016-09-05 NOTE — Assessment & Plan Note (Signed)
Plavix, Lipitor, ASA

## 2016-09-05 NOTE — Assessment & Plan Note (Signed)
Dr Burt Knack STENTs in B LEs ASA, Lipitor

## 2016-09-05 NOTE — Progress Notes (Signed)
Pre visit review using our clinic review tool, if applicable. No additional management support is needed unless otherwise documented below in the visit note. 

## 2016-09-05 NOTE — Telephone Encounter (Signed)
Pls advise on refill.../lmb 

## 2016-09-05 NOTE — Assessment & Plan Note (Signed)
CBC

## 2016-09-16 ENCOUNTER — Telehealth: Payer: Self-pay | Admitting: Internal Medicine

## 2016-09-16 NOTE — Telephone Encounter (Signed)
Pt pharmacy calling to request klonopin

## 2016-09-18 NOTE — Telephone Encounter (Signed)
OK to fill this prescription with additional refills x3 Thank you!  

## 2016-09-20 MED ORDER — CLONAZEPAM 0.5 MG PO TABS: 0.5000 mg | ORAL_TABLET | Freq: Two times a day (BID) | ORAL | 3 refills | Status: DC | PRN

## 2016-09-20 NOTE — Telephone Encounter (Signed)
Rf phoned in. See meds.  

## 2016-10-06 ENCOUNTER — Telehealth: Payer: Self-pay | Admitting: *Deleted

## 2016-10-06 MED ORDER — SILDENAFIL CITRATE 20 MG PO TABS: ORAL_TABLET | ORAL | 4 refills | Status: DC

## 2016-10-06 NOTE — Telephone Encounter (Signed)
Pharmacist left vm on triage stating the Stendra medication is too expensive for pt. Wanting to see if MD can change to sildenafil instead...[name]

## 2016-10-06 NOTE — Telephone Encounter (Signed)
Done. Thx.

## 2016-10-28 ENCOUNTER — Telehealth: Payer: Self-pay | Admitting: Internal Medicine

## 2016-10-28 DIAGNOSIS — IMO0001 Reserved for inherently not codable concepts without codable children: Secondary | ICD-10-CM

## 2016-10-28 DIAGNOSIS — I25810 Atherosclerosis of coronary artery bypass graft(s) without angina pectoris: Secondary | ICD-10-CM

## 2016-10-28 NOTE — Telephone Encounter (Signed)
Pt called in and said that he keeps gaining weight.  He wanted to know what he needs to do.  He said that it is not swelling or water weight.  Can you call him when you get a chance

## 2016-10-30 NOTE — Telephone Encounter (Signed)
I can ref him to see a dietician Thx

## 2016-11-01 NOTE — Telephone Encounter (Signed)
Ok Thx 

## 2016-11-01 NOTE — Telephone Encounter (Signed)
Pt informed. He agrees with dietician referral.   He is scheduled with Dr. Radene Gunning on 11/07/16.

## 2016-11-02 ENCOUNTER — Encounter: Payer: Self-pay | Admitting: Family

## 2016-11-07 ENCOUNTER — Ambulatory Visit (HOSPITAL_COMMUNITY)
Admission: RE | Admit: 2016-11-07 | Discharge: 2016-11-07 | Disposition: A | Discharge status: Home / Self Care | Payer: PPO | Source: Ambulatory Visit | Attending: Surgery | Admitting: Surgery

## 2016-11-07 ENCOUNTER — Ambulatory Visit (INDEPENDENT_AMBULATORY_CARE_PROVIDER_SITE_OTHER)
Admission: RE | Admit: 2016-11-07 | Discharge: 2016-11-07 | Disposition: A | Discharge status: Home / Self Care | Payer: PPO | Source: Ambulatory Visit | Attending: Surgery | Admitting: Surgery

## 2016-11-07 ENCOUNTER — Ambulatory Visit (INDEPENDENT_AMBULATORY_CARE_PROVIDER_SITE_OTHER): Payer: PPO | Admitting: Family

## 2016-11-07 ENCOUNTER — Encounter: Payer: Self-pay | Admitting: Family

## 2016-11-07 VITALS — BP 137/71 | HR 63 | Temp 97.1°F

## 2016-11-07 DIAGNOSIS — Z95828 Presence of other vascular implants and grafts: Secondary | ICD-10-CM

## 2016-11-07 DIAGNOSIS — Z87891 Personal history of nicotine dependence: Secondary | ICD-10-CM

## 2016-11-07 DIAGNOSIS — I70213 Atherosclerosis of native arteries of extremities with intermittent claudication, bilateral legs: Secondary | ICD-10-CM

## 2016-11-07 DIAGNOSIS — I739 Peripheral vascular disease, unspecified: Secondary | ICD-10-CM | POA: Diagnosis not present

## 2016-11-07 NOTE — Patient Instructions (Signed)

## 2016-11-07 NOTE — Progress Notes (Signed)
[facility] [place]   CC: Follow up peripheral artery occlusive disease  History of Present Illness Douglas Christensen is a 65 y.o. malepatient of Dr. Trula Slade who returns for follow-up. On 01/29/2015, he underwent femoral endarterectomy with vein patch angioplasty, followed by atherectomy and stenting of his left superficial femoral artery. This was done for claudication. Overlapping 7 mm Cordis stents were placed. The patient did well postoperatively and was discharged to home.He has a history of right femoral to above-knee popliteal artery bypass graft with vein which was performed on 12/10/2012. He is taking a full dose aspirin currently as he had difficulty taking Plavix.  The patient was at the beach early Summer of 2017 and developed bilateral leg swelling.  There was no inciting factor.  He has been wearing compression stockings.  He has no ulcers.  His swelling is better with elevation.  He underwent a CT scan while at the beach which showed patent right femoral-popliteal bypass graft and patent left femoral endarterectomy with stenting of the left superficial femoral artery.  There was no evidence of DVT.  Pt was last evaluated by Dr. Trula Slade on 05-02-16. At that time report from CT scan showed a patent right femoral-popliteal bypass graft and a patent left femoral endarterectomy with left superficial femoral artery stenting The patient was doing well from Dr. Trula Slade perspective. Pt was to follow up in 6 months with bilateral lower extremity duplex to evaluate his interventions.  Dr. Trula Slade encouraged him to continue wearing compression stockings for his leg swelling. Dr. Trula Slade did not think he needed to undergo additional venous insufficiency testing as he has already had his saphenous vein removed bilaterally.  He had a CABG x 5 vessels in June 2014.  He denies any known hx of stroke or TIA.   He denies claudication sx's with walking.    Pt Diabetic: No Pt  smoker: former smoker, quit in 2008, started at age 54  Pt meds include: Statin :Yes Betablocker: Yes ASA: Yes Other anticoagulants/antiplatelets: no  Past Medical History:  Diagnosis Date  . Anxiety   . Arthritis   . CAD (coronary artery disease)    Mild plaque (cath "years ago"); abnormal Myoview 04/2013 with subsequent CABG x 5 with LIMA to LAD, SVG to OM1, SVG to DX, SVG to PD & PL.   Marland Kitchen COPD (chronic obstructive pulmonary disease) ([place])   . GERD (gastroesophageal reflux disease)   . History of colonic polyps   . Hx of echocardiogram    Echo (9/15):  EF 55-60%; Gr 2 DD, mild BAE  . Hyperlipidemia   . Hypertension   . LBP (low back pain)   . Meralgia paresthetica of left side 2011  . PVD (peripheral vascular disease) ([place])    Stent to left common femoral and right superficial femoral.  2008.  50%  left renal   . Shortness of breath    "once in awhile; can happen at anytime" (08/26/2013)  . Sleep apnea    mod OSA, central sleep apnea/hypoapnea syndrome 11/22/12, CPAP every night   . Vitamin D deficiency     Social History Social History  Substance Use Topics  . Smoking status: Former Smoker    Packs/day: 2.00    Years: 47.00    Types: Cigarettes    Quit date: 12/17/2012  . Smokeless tobacco: Never Used  . Alcohol use 2.4 oz/week    4 Shots of liquor per week     Comment: 08/26/2013 "3-4 mixed drinks/wk"  Family History Family History  Problem Relation Age of Onset  . Heart disease Mother     No clear CAD and Heart Disease before age 53  . Hypertension Mother   . Cancer Mother     ? colon ca  . Hyperlipidemia Mother   . Cancer Father     brain tumor  . Alzheimer's disease Father   . Colon cancer Neg Hx   . Heart attack Neg Hx     Past Surgical History:  Procedure Laterality Date  . ABDOMINAL AORTAGRAM N/A 11/16/2011   Procedure: ABDOMINAL Maxcine Ham;  Surgeon: Sherren Mocha, MD;  Location: [facility] CATH LAB;  Service: Cardiovascular;  Laterality: N/A;  .  ABDOMINAL AORTAGRAM N/A 11/14/2012   Procedure: ABDOMINAL Maxcine Ham;  Surgeon: Sherren Mocha, MD;  Location: [facility] CATH LAB;  Service: Cardiovascular;  Laterality: N/A;  . ABDOMINAL AORTAGRAM N/A 12/30/2014   Procedure: ABDOMINAL Maxcine Ham;  Surgeon: Serafina Mitchell, MD;  Location: [facility] CATH LAB;  Service: Cardiovascular;  Laterality: N/A;  . CARDIAC CATHETERIZATION  05/02/13   x2   . CORONARY ARTERY BYPASS GRAFT N/A 05/06/2013   Procedure: CORONARY ARTERY BYPASS GRAFTING (CABG);  Surgeon: Melrose Nakayama, MD;  Location: [place];  Service: Open Heart Surgery;  Laterality: N/A;  Coronary artery bypass graft times five using left internal mammary artery and left greater saphenous vein via endovein harvest.  . ENDARTERECTOMY FEMORAL Left 01/29/2015   Procedure: ENDARTERECTOMY FEMORAL WITH PATCH ANGIOPLASTY;  Surgeon: Serafina Mitchell, MD;  Location: [place];  Service: Vascular;  Laterality: Left;  . EYE SURGERY  03/24/16   cataract surgery on left eye  . FEMORAL-POPLITEAL BYPASS GRAFT  12/27/2012   Procedure: BYPASS GRAFT FEMORAL-POPLITEAL ARTERY;  Surgeon: Serafina Mitchell, MD;  Location: [facility] OR;  Service: Vascular;  Laterality: Right;  using non-reversed sapphenous vein.  Marland Kitchen ILIAC ATHERECTOMY Left 01/29/2015   Procedure: SUPERFICIAL FEMORAL ARTERY ATHERECTOMY/PERCUTANEOUS TRANSLUMINAL ANGIOPLASTY; superficial femoral artery stent;  Surgeon: Serafina Mitchell, MD;  Location: [place];  Service: Vascular;  Laterality: Left;  . LOWER EXTREMITY ANGIOGRAM Bilateral 11/16/2011   Procedure: LOWER EXTREMITY ANGIOGRAM;  Surgeon: Sherren Mocha, MD;  Location: [facility] CATH LAB;  Service: Cardiovascular;  Laterality: Bilateral;  . lower extremity stents     bilateral lower extremities x 2  . PERCUTANEOUS STENT INTERVENTION Left 11/16/2011   Procedure: PERCUTANEOUS STENT INTERVENTION;  Surgeon: Sherren Mocha, MD;  Location: [facility] CATH LAB;  Service: Cardiovascular;  Laterality: Left;  . TONSILLECTOMY    . TOTAL HIP ARTHROPLASTY Left  06/29/2015   Procedure: TOTAL HIP ARTHROPLASTY;  Surgeon: Frederik Pear, MD;  Location: [place];  Service: Orthopedics;  Laterality: Left;  LEFT TOTAL HIP ARTHROPLASTY DEPUY SROM/PINNACLE    Allergies  Allergen Reactions  . Roxicodone [Oxycodone Hcl] Other (See Comments)    hallucinations  . Benazepril Cough  . Itraconazole Nausea Only and Rash    Current Outpatient Prescriptions  Medication Sig Dispense Refill  . amLODipine (NORVASC) 5 MG tablet Take 1 tablet (5 mg total) by mouth daily. Keep oct appt for future refills 90 tablet 0  . aspirin EC 325 MG tablet Take 1 tablet (325 mg total) by mouth 2 (two) times daily. 30 tablet 0  . atenolol (TENORMIN) 50 MG tablet Take 1 tablet (50 mg total) by mouth daily. 90 tablet 2  . atorvastatin (LIPITOR) 20 MG tablet Take 1 tablet (20 mg total) by mouth daily. 90 tablet 2  . cholecalciferol (VITAMIN D) 1000 units tablet Take 2 tablets (2,000  Units total) by mouth daily. 100 tablet 3  . clonazePAM (KLONOPIN) 0.5 MG tablet Take 1 tablet (0.5 mg total) by mouth 2 (two) times daily as needed for anxiety. 60 tablet 3  . hydrALAZINE (APRESOLINE) 50 MG tablet TAKE 50 MG BY MOUTH THREE TIMES DAILY 270 tablet 2  . sildenafil (REVATIO) 20 MG tablet 20-100 mg qd prn 50 tablet 4  . vitamin B-12 (CYANOCOBALAMIN) 1000 MCG tablet Take 1,000 mcg by mouth daily.     Current Facility-Administered Medications  Medication Dose Route Frequency Provider Last Rate Last Dose  . ipratropium-albuterol (DUONEB) 0.5-2.5 (3) MG/3ML nebulizer solution 3 mL  3 mL Nebulization Once Janith Lima, MD        ROS: See HPI for pertinent positives and negatives.   Physical Examination  Vitals:   11/07/16 1429  BP: 137/71  Pulse: 63  Temp: 97.1 F (36.2 C)  SpO2: 95%   There is no height or weight on file to calculate BMI.  General:A&O x 3, WDWN, obese male. Gait: normal Eyes: Pupils equal. Pulmonary: Respirations are non labored, CTAB, without wheezes , rales or  rhonchi. Cardiac: regular rythm, no detected murmur.     Carotid Bruits Left Right   Negative Negative  Aorta is not palpable. Radial pulses: are 2+ palpable and =.   VASCULAR EXAM: Extremities without ischemic changes  without Gangrene; without open wounds.     LE Pulses LEFT RIGHT   FEMORAL 2+ palpable 2+ palpable    POPLITEAL not palpable  not palpable   POSTERIOR TIBIAL 2+ palpable  2+ palpable    DORSALIS PEDIS  ANTERIOR TIBIAL not palpable  2+ palpable    Abdomen: soft, NT, no palpable masses. Skin: no rashes, no ulcers noted. Musculoskeletal: no muscle wasting or atrophy. Neurologic: A&O X 3; Appropriate Affect ; SENSATION: normal; MOTOR FUNCTION: moving all extremities equally, motor strength 5/5 throughout. Speech is fluent/normal. CN 2-12     ASSESSMENT: Douglas Christensen is a 65 y.o. male who is s/p right SFA stent placement in 2008; left iliac artery stent placement in 2013; right femoral to above knee poplieteal artery bypass graft on 12-11-12; left EIA, common femoral, profunda femoral, and SFA endarterectomy/angioplasty and left SFA stent placed on 01-29-15.   He has no claudication sx's with walking.  I discussed with Dr. Trula Slade bilateral common femoral artery ectasias.  DATA Maximum diameter measurements of the right CFA and left common femoral/proximal SFA are 1.8 cm and 2.0 cm respectively. Patent right femoral to popliteal artery bypass graft and left SFA stent with no significant stenosis. Velocity of the left distal EIA suggests a possible greater than 50% stenosis. No significant change in the right bypass graft of left stent velocities compared to the exam on 06-22-15 and 10-13-15. Bilateral ABI's remain 100% with all triphasic  waveforms.   PLAN:  Continued graduated walking program. Based on the patient's vascular studies and examination, pt will return to clinic in 6 months with ABI's and bilateral LE arterial duplex.  I discussed in depth with the patient the nature of atherosclerosis, and emphasized the importance of maximal medical management including strict control of blood pressure, blood glucose, and lipid levels, obtaining regular exercise, and continued cessation of smoking.  The patient is aware that without maximal medical management the underlying atherosclerotic disease process will progress, limiting the benefit of any interventions.  The patient was given information about PAD including signs, symptoms, treatment, what symptoms should prompt the patient to seek immediate medical  care, and risk reduction measures to take.  Clemon Chambers, RN, MSN, FNP-C [facility] Phone: [phone]  Clinic MD: Trula Slade  11/07/16 2:46 PM

## 2016-11-08 ENCOUNTER — Other Ambulatory Visit: Payer: Self-pay | Admitting: *Deleted

## 2016-11-08 MED ORDER — AMLODIPINE BESYLATE 5 MG PO TABS: 5.0000 mg | ORAL_TABLET | Freq: Every day | ORAL | 1 refills | Status: DC

## 2016-11-15 NOTE — Addendum Note (Signed)
Addended by: Lianne Cure A on: 11/15/2016 09:05 AM   Modules accepted: Orders

## 2016-12-01 ENCOUNTER — Encounter: Payer: PPO | Attending: Internal Medicine | Admitting: Dietician

## 2016-12-22 ENCOUNTER — Ambulatory Visit (HOSPITAL_COMMUNITY)
Admission: EM | Admit: 2016-12-22 | Discharge: 2016-12-22 | Disposition: A | Discharge status: Home / Self Care | Payer: PPO | Attending: Family Medicine | Admitting: Family Medicine

## 2016-12-22 ENCOUNTER — Encounter (HOSPITAL_COMMUNITY): Payer: Self-pay | Admitting: Emergency Medicine

## 2016-12-22 DIAGNOSIS — M25562 Pain in left knee: Secondary | ICD-10-CM

## 2016-12-22 MED ORDER — NAPROXEN 500 MG PO TABS: 500.0000 mg | ORAL_TABLET | Freq: Two times a day (BID) | ORAL | 0 refills | Status: DC

## 2016-12-22 MED ORDER — KETOROLAC TROMETHAMINE 60 MG/2ML IM SOLN
INTRAMUSCULAR | Status: AC
  Filled 2016-12-22: qty 2

## 2016-12-22 MED ORDER — KETOROLAC TROMETHAMINE 60 MG/2ML IM SOLN
60.0000 mg | Freq: Once | INTRAMUSCULAR | Status: AC
  Administered 2016-12-22: 60 mg via INTRAMUSCULAR

## 2016-12-22 NOTE — ED Provider Notes (Signed)
CSN: [id number]     Arrival date & time 12/22/16  1138 History   None    Chief Complaint  Patient presents with  . Knee Pain    left   (Consider location/radiation/quality/duration/timing/severity/associated sxs/prior Treatment) Patient c/o left knee pain after twisting and hearing a pop and now has some pain and discomfort in left knee.   The history is provided by the patient.  Knee Pain  Location:  Knee Time since incident:  1 day Injury: yes   Mechanism of injury: amputation   Knee location:  L knee Pain details:    Quality:  Aching   Radiates to:  Does not radiate   Severity:  Moderate   Onset quality:  Sudden   Duration:  1 day   Timing:  Constant Chronicity:  New Dislocation: no   Foreign body present:  No foreign bodies Tetanus status:  Out of date Prior injury to area:  No Relieved by:  None tried Worsened by:  Nothing Ineffective treatments:  None tried Associated symptoms: decreased ROM     Past Medical History:  Diagnosis Date  . Anxiety   . Arthritis   . CAD (coronary artery disease)    Mild plaque (cath "years ago"); abnormal Myoview 04/2013 with subsequent CABG x 5 with LIMA to LAD, SVG to OM1, SVG to DX, SVG to PD & PL.   Marland Kitchen COPD (chronic obstructive pulmonary disease) ([place])   . GERD (gastroesophageal reflux disease)   . History of colonic polyps   . Hx of echocardiogram    Echo (9/15):  EF 55-60%; Gr 2 DD, mild BAE  . Hyperlipidemia   . Hypertension   . LBP (low back pain)   . Meralgia paresthetica of left side 2011  . PVD (peripheral vascular disease) ([place])    Stent to left common femoral and right superficial femoral.  2008.  50%  left renal   . Shortness of breath    "once in awhile; can happen at anytime" (08/26/2013)  . Sleep apnea    mod OSA, central sleep apnea/hypoapnea syndrome 11/22/12, CPAP every night   . Vitamin D deficiency    Past Surgical History:  Procedure Laterality Date  . ABDOMINAL AORTAGRAM N/A 11/16/2011   Procedure:  ABDOMINAL Maxcine Ham;  Surgeon: Sherren Mocha, MD;  Location: [facility] CATH LAB;  Service: Cardiovascular;  Laterality: N/A;  . ABDOMINAL AORTAGRAM N/A 11/14/2012   Procedure: ABDOMINAL Maxcine Ham;  Surgeon: Sherren Mocha, MD;  Location: [facility] CATH LAB;  Service: Cardiovascular;  Laterality: N/A;  . ABDOMINAL AORTAGRAM N/A 12/30/2014   Procedure: ABDOMINAL Maxcine Ham;  Surgeon: Serafina Mitchell, MD;  Location: [facility] CATH LAB;  Service: Cardiovascular;  Laterality: N/A;  . CARDIAC CATHETERIZATION  05/02/13   x2   . CORONARY ARTERY BYPASS GRAFT N/A 05/06/2013   Procedure: CORONARY ARTERY BYPASS GRAFTING (CABG);  Surgeon: Melrose Nakayama, MD;  Location: [place];  Service: Open Heart Surgery;  Laterality: N/A;  Coronary artery bypass graft times five using left internal mammary artery and left greater saphenous vein via endovein harvest.  . ENDARTERECTOMY FEMORAL Left 01/29/2015   Procedure: ENDARTERECTOMY FEMORAL WITH PATCH ANGIOPLASTY;  Surgeon: Serafina Mitchell, MD;  Location: [place];  Service: Vascular;  Laterality: Left;  . EYE SURGERY  03/24/16   cataract surgery on left eye  . FEMORAL-POPLITEAL BYPASS GRAFT  12/27/2012   Procedure: BYPASS GRAFT FEMORAL-POPLITEAL ARTERY;  Surgeon: Serafina Mitchell, MD;  Location: [place];  Service: Vascular;  Laterality: Right;  using non-reversed sapphenous  vein.  Marland Kitchen ILIAC ATHERECTOMY Left 01/29/2015   Procedure: SUPERFICIAL FEMORAL ARTERY ATHERECTOMY/PERCUTANEOUS TRANSLUMINAL ANGIOPLASTY; superficial femoral artery stent;  Surgeon: Serafina Mitchell, MD;  Location: [place];  Service: Vascular;  Laterality: Left;  . LOWER EXTREMITY ANGIOGRAM Bilateral 11/16/2011   Procedure: LOWER EXTREMITY ANGIOGRAM;  Surgeon: Sherren Mocha, MD;  Location[facility] CATH LAB;  Service: Cardiovascular;  Laterality: Bilateral;  . lower extremity stents     bilateral lower extremities x 2  . PERCUTANEOUS STENT INTERVENTION Left 11/16/2011   Procedure: PERCUTANEOUS STENT INTERVENTION;  Surgeon: Sherren Mocha, MD;   Location: [facility] CATH LAB;  Service: Cardiovascular;  Laterality: Left;  . TONSILLECTOMY    . TOTAL HIP ARTHROPLASTY Left 06/29/2015   Procedure: TOTAL HIP ARTHROPLASTY;  Surgeon: Frederik Pear, MD;  Location: [place];  Service: Orthopedics;  Laterality: Left;  LEFT TOTAL HIP ARTHROPLASTY DEPUY SROM/PINNACLE   Family History  Problem Relation Age of Onset  . Heart disease Mother     No clear CAD and Heart Disease before age 12  . Hypertension Mother   . Cancer Mother     ? colon ca  . Hyperlipidemia Mother   . Cancer Father     brain tumor  . Alzheimer's disease Father   . Colon cancer Neg Hx   . Heart attack Neg Hx    Social History  Substance Use Topics  . Smoking status: Former Smoker    Packs/day: 2.00    Years: 47.00    Types: Cigarettes    Quit date: 12/17/2012  . Smokeless tobacco: Never Used  . Alcohol use 2.4 oz/week    4 Shots of liquor per week     Comment: 08/26/2013 "3-4 mixed drinks/wk"    Review of Systems  Constitutional: Negative.   HENT: Negative.   Eyes: Negative.   Respiratory: Negative.   Cardiovascular: Negative.   Gastrointestinal: Negative.   Endocrine: Negative.   Genitourinary: Negative.   Musculoskeletal: Positive for arthralgias.  Allergic/Immunologic: Negative.   Neurological: Negative.   Psychiatric/Behavioral: Negative.     Allergies  Roxicodone [oxycodone hcl]; Benazepril; and Itraconazole  Home Medications   Prior to Admission medications   Medication Sig Start Date End Date Taking? Authorizing Provider  amLODipine (NORVASC) 5 MG tablet Take 1 tablet (5 mg total) by mouth daily. 11/08/16  Yes Aleksei Plotnikov V, MD  aspirin EC 325 MG tablet Take 1 tablet (325 mg total) by mouth 2 (two) times daily. 06/29/15  Yes Leighton Parody, PA-C  atenolol (TENORMIN) 50 MG tablet Take 1 tablet (50 mg total) by mouth daily. 05/30/16  Yes Aleksei Plotnikov V, MD  atorvastatin (LIPITOR) 20 MG tablet Take 1 tablet (20 mg total) by mouth daily. 06/28/16  Yes  Janith Lima, MD  cholecalciferol (VITAMIN D) 1000 units tablet Take 2 tablets (2,000 Units total) by mouth daily. 03/01/16  Yes Aleksei Plotnikov V, MD  clonazePAM (KLONOPIN) 0.5 MG tablet Take 1 tablet (0.5 mg total) by mouth 2 (two) times daily as needed for anxiety. 09/20/16  Yes Aleksei Plotnikov V, MD  hydrALAZINE (APRESOLINE) 50 MG tablet TAKE 50 MG BY MOUTH THREE TIMES DAILY 04/29/16  Yes Aleksei Plotnikov V, MD  sildenafil (REVATIO) 20 MG tablet 20-100 mg qd prn 10/06/16  Yes Aleksei Plotnikov V, MD  vitamin B-12 (CYANOCOBALAMIN) 1000 MCG tablet Take 1,000 mcg by mouth daily.   Yes Historical Provider, MD  naproxen (NAPROSYN) 500 MG tablet Take 1 tablet (500 mg total) by mouth 2 (two) times daily with a  meal. 12/22/16   Lysbeth Penner, FNP   Meds Ordered and Administered this Visit   Medications  ketorolac (TORADOL) injection 60 mg (60 mg Intramuscular Given 12/22/16 1321)    BP 168/68 (BP Location: Left Arm)   Pulse 74   Temp 98.2 F (36.8 C) (Oral)   SpO2 94%  No data found.   Physical Exam  Constitutional: He is oriented to person, place, and time. He appears well-developed and well-nourished.  HENT:  Head: Normocephalic.  Right Ear: External ear normal.  Left Ear: External ear normal.  Mouth/Throat: Oropharynx is clear and moist.  Eyes: Conjunctivae and EOM are normal. Pupils are equal, round, and reactive to light.  Neck: Normal range of motion. Neck supple.  Cardiovascular: Normal rate, regular rhythm and normal heart sounds.   Pulmonary/Chest: Effort normal and breath sounds normal.  Abdominal: Soft. Bowel sounds are normal.  Musculoskeletal: He exhibits tenderness.  Leftt knee TTP medial    Neurological: He is alert and oriented to person, place, and time.  Nursing note and vitals reviewed.   Urgent Care Course     Procedures (including critical care time)  Labs Review Labs Reviewed - No data to display  Imaging Review No results found.   Visual  Acuity Review  Right Eye Distance:   Left Eye Distance:   Bilateral Distance:    Right Eye Near:   Left Eye Near:    Bilateral Near:         MDM   1. Acute pain of left knee    Left knee sleeve Naprosyn 500mg  one po bid x 7 days #14     Lysbeth Penner, FNP 12/22/16 1345

## 2016-12-22 NOTE — ED Triage Notes (Signed)
Pt states he was putting dishes away yesterday when he twisted his left knee and heard a pop.  He states the pain is worse today than yesterday and Tramadol did not help with his pain. Pt has no previous issue with the knee.

## 2016-12-27 DIAGNOSIS — M25562 Pain in left knee: Secondary | ICD-10-CM | POA: Diagnosis not present

## 2016-12-28 DIAGNOSIS — Z961 Presence of intraocular lens: Secondary | ICD-10-CM | POA: Diagnosis not present

## 2016-12-28 DIAGNOSIS — H35372 Puckering of macula, left eye: Secondary | ICD-10-CM | POA: Diagnosis not present

## 2017-01-17 ENCOUNTER — Other Ambulatory Visit: Payer: Self-pay

## 2017-01-17 NOTE — Telephone Encounter (Signed)
Rx request for Klonopin 0.5 mg Last OV 09/05/2016 Next OV 03/01/2017 Last refilled 09/20/2016 Please advise

## 2017-02-01 DIAGNOSIS — G4730 Sleep apnea, unspecified: Secondary | ICD-10-CM | POA: Diagnosis not present

## 2017-02-08 ENCOUNTER — Other Ambulatory Visit: Payer: Self-pay | Admitting: *Deleted

## 2017-02-22 ENCOUNTER — Telehealth: Payer: Self-pay | Admitting: *Deleted

## 2017-02-22 MED ORDER — CLONAZEPAM 0.5 MG PO TABS: 0.5000 mg | ORAL_TABLET | Freq: Two times a day (BID) | ORAL | 0 refills | Status: DC | PRN

## 2017-02-22 NOTE — Telephone Encounter (Signed)
OK to fill this/these prescription(s) with additional refills x0 Thank you!  

## 2017-02-22 NOTE — Telephone Encounter (Signed)
Faxed script to [facility]...Douglas Christensen

## 2017-02-22 NOTE — Telephone Encounter (Signed)
Left msg on triage requesting refill on pt clonazepam. Per chart refill has been denied due to pt needing appt..../LMB

## 2017-02-22 NOTE — Telephone Encounter (Signed)
Rec'd call pt has made appt for 4/4/, but is out of his clonazepam. Pt is requesting refill until his appt...Douglas Christensen

## 2017-02-24 ENCOUNTER — Other Ambulatory Visit: Payer: Self-pay | Admitting: Internal Medicine

## 2017-02-28 ENCOUNTER — Telehealth: Payer: Self-pay

## 2017-02-28 MED ORDER — ATENOLOL 50 MG PO TABS: 50.0000 mg | ORAL_TABLET | Freq: Every day | ORAL | 0 refills | Status: DC

## 2017-02-28 NOTE — Telephone Encounter (Signed)
Douglas Christensen sent a RX on Atenolol. 30 supply was sent, patient has appointment tomorrow.

## 2017-03-01 ENCOUNTER — Encounter: Payer: Self-pay | Admitting: Internal Medicine

## 2017-03-01 ENCOUNTER — Ambulatory Visit (INDEPENDENT_AMBULATORY_CARE_PROVIDER_SITE_OTHER): Payer: PPO | Admitting: Internal Medicine

## 2017-03-01 VITALS — BP 138/82 | HR 68 | Temp 97.6°F | Ht 65.0 in | Wt 186.0 lb

## 2017-03-01 DIAGNOSIS — M5440 Lumbago with sciatica, unspecified side: Secondary | ICD-10-CM | POA: Diagnosis not present

## 2017-03-01 DIAGNOSIS — Z23 Encounter for immunization: Secondary | ICD-10-CM | POA: Diagnosis not present

## 2017-03-01 DIAGNOSIS — E538 Deficiency of other specified B group vitamins: Secondary | ICD-10-CM

## 2017-03-01 DIAGNOSIS — N529 Male erectile dysfunction, unspecified: Secondary | ICD-10-CM | POA: Diagnosis not present

## 2017-03-01 DIAGNOSIS — I119 Hypertensive heart disease without heart failure: Secondary | ICD-10-CM

## 2017-03-01 DIAGNOSIS — R7309 Other abnormal glucose: Secondary | ICD-10-CM | POA: Diagnosis not present

## 2017-03-01 DIAGNOSIS — F411 Generalized anxiety disorder: Secondary | ICD-10-CM | POA: Diagnosis not present

## 2017-03-01 DIAGNOSIS — Z0000 Encounter for general adult medical examination without abnormal findings: Secondary | ICD-10-CM

## 2017-03-01 MED ORDER — AVANAFIL 100 MG PO TABS: 100.0000 mg | ORAL_TABLET | Freq: Every day | ORAL | 5 refills | Status: DC | PRN

## 2017-03-01 MED ORDER — CLONAZEPAM 0.5 MG PO TABS: 0.5000 mg | ORAL_TABLET | Freq: Two times a day (BID) | ORAL | 1 refills | Status: DC | PRN

## 2017-03-01 NOTE — Addendum Note (Signed)
Addended by: Elta Guadeloupe on: 03/01/2017 11:55 AM   Modules accepted: Orders

## 2017-03-01 NOTE — Assessment & Plan Note (Signed)
Doing well 

## 2017-03-01 NOTE — Addendum Note (Signed)
Addended by: Cassandria Anger on: 03/01/2017 11:52 AM   Modules accepted: Orders

## 2017-03-01 NOTE — Assessment & Plan Note (Signed)
On B12 

## 2017-03-01 NOTE — Progress Notes (Addendum)
Subjective:  Patient ID: Douglas Christensen, male    DOB: 09-05-1951  Age: 66 y.o. MRN: [id number]  CC: Annual Exam   HPI Douglas Christensen presents for a well exam C/o ED F/u HTN, CAD  Outpatient Medications Prior to Visit  Medication Sig Dispense Refill  . amLODipine (NORVASC) 5 MG tablet Take 1 tablet (5 mg total) by mouth daily. 90 tablet 1  . aspirin EC 325 MG tablet Take 1 tablet (325 mg total) by mouth 2 (two) times daily. 30 tablet 0  . atenolol (TENORMIN) 50 MG tablet Take 1 tablet (50 mg total) by mouth daily. 30 tablet 0  . atorvastatin (LIPITOR) 20 MG tablet Take 1 tablet (20 mg total) by mouth daily. 90 tablet 2  . cholecalciferol (VITAMIN D) 1000 units tablet Take 2 tablets (2,000 Units total) by mouth daily. 100 tablet 3  . clonazePAM (KLONOPIN) 0.5 MG tablet Take 1 tablet (0.5 mg total) by mouth 2 (two) times daily as needed for anxiety. 60 tablet 0  . hydrALAZINE (APRESOLINE) 50 MG tablet TAKE 50 MG BY MOUTH THREE TIMES DAILY 270 tablet 2  . sildenafil (REVATIO) 20 MG tablet 20-100 mg qd prn 50 tablet 4  . vitamin B-12 (CYANOCOBALAMIN) 1000 MCG tablet Take 1,000 mcg by mouth daily.    . naproxen (NAPROSYN) 500 MG tablet Take 1 tablet (500 mg total) by mouth 2 (two) times daily with a meal. 20 tablet 0   Facility-Administered Medications Prior to Visit  Medication Dose Route Frequency Provider Last Rate Last Dose  . ipratropium-albuterol (DUONEB) 0.5-2.5 (3) MG/3ML nebulizer solution 3 mL  3 mL Nebulization Once Janith Lima, MD        ROS Review of Systems  Constitutional: Negative for appetite change, fatigue and unexpected weight change.  HENT: Negative for congestion, nosebleeds, sneezing, sore throat and trouble swallowing.   Eyes: Negative for itching and visual disturbance.  Respiratory: Negative for cough.   Cardiovascular: Negative for chest pain, palpitations and leg swelling.  Gastrointestinal: Negative for abdominal distention, blood in stool, diarrhea and  nausea.  Genitourinary: Negative for frequency and hematuria.  Musculoskeletal: Positive for arthralgias. Negative for back pain, gait problem, joint swelling and neck pain.  Skin: Negative for rash.  Neurological: Negative for dizziness, tremors, speech difficulty and weakness.  Psychiatric/Behavioral: Negative for agitation, dysphoric mood, sleep disturbance and suicidal ideas. The patient is not nervous/anxious.     Objective:  BP 138/82   Pulse 68   Temp 97.6 F (36.4 C)   Ht 5\' 5"  (1.651 m)   Wt 186 lb (84.4 kg)   SpO2 98%   BMI 30.95 kg/m   BP Readings from Last 3 Encounters:  03/01/17 138/82  12/22/16 168/68  11/07/16 137/71    Wt Readings from Last 3 Encounters:  03/01/17 186 lb (84.4 kg)  09/05/16 193 lb (87.5 kg)  05/02/16 188 lb (85.3 kg)    Physical Exam  Constitutional: He is oriented to person, place, and time. He appears well-developed and well-nourished. No distress.  HENT:  Head: Normocephalic and atraumatic.  Right Ear: External ear normal.  Left Ear: External ear normal.  Nose: Nose normal.  Mouth/Throat: Oropharynx is clear and moist. No oropharyngeal exudate.  Eyes: Conjunctivae and EOM are normal. Pupils are equal, round, and reactive to light. Right eye exhibits no discharge. Left eye exhibits no discharge. No scleral icterus.  Neck: Normal range of motion. Neck supple. No JVD present. No tracheal deviation present. No thyromegaly present.  Cardiovascular: Normal rate, regular rhythm, normal heart sounds and intact distal pulses.  Exam reveals no gallop and no friction rub.   No murmur heard. Pulmonary/Chest: Effort normal and breath sounds normal. No stridor. No respiratory distress. He has no wheezes. He has no rales. He exhibits no tenderness.  Abdominal: Soft. Bowel sounds are normal. He exhibits no distension and no mass. There is no tenderness. There is no rebound and no guarding.  Genitourinary: Rectum normal, prostate normal and penis  normal. Rectal exam shows guaiac negative stool. No penile tenderness.  Musculoskeletal: Normal range of motion. He exhibits tenderness. He exhibits no edema.  Lymphadenopathy:    He has no cervical adenopathy.  Neurological: He is alert and oriented to person, place, and time. He has normal reflexes. No cranial nerve deficit. He exhibits normal muscle tone. Coordination normal.  Skin: Skin is warm and dry. No rash noted. He is not diaphoretic. No erythema. No pallor.  Psychiatric: He has a normal mood and affect. His behavior is normal. Judgment and thought content normal.    Lab Results  Component Value Date   WBC 10.1 09/05/2016   HGB 14.9 09/05/2016   HCT 43.0 09/05/2016   PLT 228.0 09/05/2016   GLUCOSE 87 09/05/2016   CHOL 185 03/01/2016   TRIG 148.0 03/01/2016   HDL 38.90 (L) 03/01/2016   LDLDIRECT 138.6 11/05/2014   LDLCALC 117 (H) 03/01/2016   ALT 54 (H) 09/05/2016   AST 58 (H) 09/05/2016   NA 142 09/05/2016   K 3.8 09/05/2016   CL 106 09/05/2016   CREATININE 0.95 09/05/2016   BUN 13 09/05/2016   CO2 26 09/05/2016   TSH 2.73 09/05/2016   PSA 0.61 03/01/2016   INR 1.10 06/22/2015   HGBA1C 5.8 (H) 05/03/2013    No results found.  Assessment & Plan:   There are no diagnoses linked to this encounter. I have discontinued Douglas Christensen naproxen. I am also having him maintain his vitamin B-12, aspirin EC, cholecalciferol, hydrALAZINE, atorvastatin, sildenafil, amLODipine, clonazePAM, and atenolol. We will continue to administer ipratropium-albuterol.  No orders of the defined types were placed in this encounter.    Follow-up: No Follow-up on file.  [name], MD

## 2017-03-01 NOTE — Assessment & Plan Note (Addendum)
Here for medicare wellness/physical  Diet: heart healthy  Physical activity: not sedentary  Depression/mood screen: negative  Hearing: intact to whispered voice  Visual acuity: grossly normal, performs annual eye exam  ADLs: capable  Fall risk: low to none  Home safety: good  Cognitive evaluation: intact to orientation, naming, recall and repetition  EOL planning: adv directives, full code/ I agree  I have personally reviewed and have noted  1. The patient's medical, surgical and social history  2. Their use of alcohol, tobacco or illicit drugs  3. Their current medications and supplements  4. The patient's functional ability including ADL's, fall risks, home safety risks and hearing or visual impairment.  5. Diet and physical activities  6. Evidence for depression or mood disorders 7. The roster of all physicians providing medical care to patient - is listed in the Snapshot section of the chart and reviewed today.    Today patient counseled on age appropriate routine health concerns for screening and prevention, each reviewed and up to date or declined. Immunizations reviewed and up to date or declined. Labs ordered and reviewed. Risk factors for depression reviewed and negative. Hearing function and visual acuity are intact. ADLs screened and addressed as needed. Functional ability and level of safety reviewed and appropriate. Education, counseling and referrals performed based on assessed risks today. Patient provided with a copy of personalized plan for preventive services.    Colon due 2019 tDAP Letter given for work

## 2017-03-01 NOTE — Assessment & Plan Note (Signed)
Stendra prn 

## 2017-03-01 NOTE — Assessment & Plan Note (Signed)
Clonazepam prn at hs

## 2017-03-01 NOTE — Assessment & Plan Note (Signed)
On Hydralazine °

## 2017-03-06 ENCOUNTER — Other Ambulatory Visit (INDEPENDENT_AMBULATORY_CARE_PROVIDER_SITE_OTHER): Payer: PPO

## 2017-03-06 DIAGNOSIS — Z0000 Encounter for general adult medical examination without abnormal findings: Secondary | ICD-10-CM | POA: Diagnosis not present

## 2017-03-06 DIAGNOSIS — R7309 Other abnormal glucose: Secondary | ICD-10-CM | POA: Diagnosis not present

## 2017-03-06 LAB — HEPATIC FUNCTION PANEL
ALK PHOS: 54 U/L (ref 39–117)
ALT: 38 U/L (ref 0–53)
AST: 34 U/L (ref 0–37)
Albumin: 4.4 g/dL (ref 3.5–5.2)
BILIRUBIN DIRECT: 0.1 mg/dL (ref 0.0–0.3)
TOTAL PROTEIN: 6.9 g/dL (ref 6.0–8.3)
Total Bilirubin: 0.6 mg/dL (ref 0.2–1.2)

## 2017-03-06 LAB — URINALYSIS
BILIRUBIN URINE: NEGATIVE
Hgb urine dipstick: NEGATIVE
Ketones, ur: NEGATIVE
Leukocytes, UA: NEGATIVE
NITRITE: NEGATIVE
PH: 5.5 (ref 5.0–8.0)
Specific Gravity, Urine: 1.025 (ref 1.000–1.030)
Urine Glucose: NEGATIVE
Urobilinogen, UA: 0.2 (ref 0.0–1.0)

## 2017-03-06 LAB — LIPID PANEL
Cholesterol: 181 mg/dL (ref 0–200)
HDL: 50 mg/dL (ref >39.00)
LDL Cholesterol: 107 mg/dL — ABNORMAL HIGH (ref 0–99)
NONHDL: 130.7
Total CHOL/HDL Ratio: 4
Triglycerides: 119 mg/dL (ref 0.0–149.0)
VLDL: 23.8 mg/dL (ref 0.0–40.0)

## 2017-03-06 LAB — CBC WITH DIFFERENTIAL/PLATELET
BASOS PCT: 1 % (ref 0.0–3.0)
Basophils Absolute: 0.1 10*3/uL (ref 0.0–0.1)
Eosinophils Absolute: 0.3 10*3/uL (ref 0.0–0.7)
Eosinophils Relative: 3.8 % (ref 0.0–5.0)
HEMATOCRIT: 47.4 % (ref 39.0–52.0)
HEMOGLOBIN: 16.1 g/dL (ref 13.0–17.0)
LYMPHS PCT: 25.4 % (ref 12.0–46.0)
Lymphs Abs: 1.8 10*3/uL (ref 0.7–4.0)
MCHC: 33.9 g/dL (ref 30.0–36.0)
MCV: 96.9 fl (ref 78.0–100.0)
MONO ABS: 0.6 10*3/uL (ref 0.1–1.0)
Monocytes Relative: 8.4 % (ref 3.0–12.0)
Neutro Abs: 4.4 10*3/uL (ref 1.4–7.7)
Neutrophils Relative %: 61.4 % (ref 43.0–77.0)
Platelets: 217 10*3/uL (ref 150.0–400.0)
RBC: 4.89 Mil/uL (ref 4.22–5.81)
RDW: 13.5 % (ref 11.5–15.5)
WBC: 7.2 10*3/uL (ref 4.0–10.5)

## 2017-03-06 LAB — BASIC METABOLIC PANEL
BUN: 22 mg/dL (ref 6–23)
CO2: 27 meq/L (ref 19–32)
CREATININE: 0.89 mg/dL (ref 0.40–1.50)
Calcium: 9.5 mg/dL (ref 8.4–10.5)
Chloride: 107 mEq/L (ref 96–112)
GFR: 90.89 mL/min (ref >60.00)
GLUCOSE: 94 mg/dL (ref 70–99)
Potassium: 4.1 mEq/L (ref 3.5–5.1)
Sodium: 141 mEq/L (ref 135–145)

## 2017-03-06 LAB — PSA: PSA: 0.87 ng/mL (ref 0.10–4.00)

## 2017-03-06 LAB — TSH: TSH: 3.31 u[IU]/mL (ref 0.35–4.50)

## 2017-03-06 LAB — HEMOGLOBIN A1C: Hgb A1c MFr Bld: 5.7 % (ref 4.6–6.5)

## 2017-03-21 ENCOUNTER — Other Ambulatory Visit: Payer: Self-pay | Admitting: Internal Medicine

## 2017-03-23 ENCOUNTER — Ambulatory Visit (INDEPENDENT_AMBULATORY_CARE_PROVIDER_SITE_OTHER): Payer: PPO | Admitting: Internal Medicine

## 2017-03-23 VITALS — BP 130/64 | HR 54 | Temp 98.2°F | Ht 64.5 in | Wt 185.0 lb

## 2017-03-23 DIAGNOSIS — I119 Hypertensive heart disease without heart failure: Secondary | ICD-10-CM | POA: Diagnosis not present

## 2017-03-23 DIAGNOSIS — N529 Male erectile dysfunction, unspecified: Secondary | ICD-10-CM | POA: Diagnosis not present

## 2017-03-23 DIAGNOSIS — L570 Actinic keratosis: Secondary | ICD-10-CM | POA: Diagnosis not present

## 2017-03-23 MED ORDER — ATENOLOL 50 MG PO TABS: 50.0000 mg | ORAL_TABLET | Freq: Every day | ORAL | 3 refills | Status: DC

## 2017-03-23 MED ORDER — HYDRALAZINE HCL 50 MG PO TABS: ORAL_TABLET | ORAL | 3 refills | Status: DC

## 2017-03-23 MED ORDER — ATORVASTATIN CALCIUM 20 MG PO TABS: 20.0000 mg | ORAL_TABLET | Freq: Every day | ORAL | 3 refills | Status: DC

## 2017-03-23 MED ORDER — AMLODIPINE BESYLATE 5 MG PO TABS: 5.0000 mg | ORAL_TABLET | Freq: Every day | ORAL | 3 refills | Status: DC

## 2017-03-23 NOTE — Assessment & Plan Note (Addendum)
Labs ok Meds re-newed On Hydralazine, Atenolol, Amlodipine

## 2017-03-23 NOTE — Assessment & Plan Note (Signed)
See cryo 

## 2017-03-23 NOTE — Assessment & Plan Note (Signed)
Douglas Christensen

## 2017-03-23 NOTE — Progress Notes (Signed)
Subjective:  Patient ID: Douglas Christensen, male    DOB: 1951-03-25  Age: 66 y.o. MRN: [id number]  CC: Follow-up (place on nose, not better)   HPI TRAYSON STITELY presents for nose AK. C/o drye eyes after cataract surgery. F/u ED  Outpatient Medications Prior to Visit  Medication Sig Dispense Refill  . amLODipine (NORVASC) 5 MG tablet Take 1 tablet (5 mg total) by mouth daily. 90 tablet 1  . aspirin EC 325 MG tablet Take 1 tablet (325 mg total) by mouth 2 (two) times daily. 30 tablet 0  . atenolol (TENORMIN) 50 MG tablet Take 1 tablet (50 mg total) by mouth daily. 30 tablet 0  . atorvastatin (LIPITOR) 20 MG tablet Take 1 tablet (20 mg total) by mouth daily. 90 tablet 2  . cholecalciferol (VITAMIN D) 1000 units tablet Take 2 tablets (2,000 Units total) by mouth daily. 100 tablet 3  . clonazePAM (KLONOPIN) 0.5 MG tablet Take 1 tablet (0.5 mg total) by mouth 2 (two) times daily as needed for anxiety. 60 tablet 1  . hydrALAZINE (APRESOLINE) 50 MG tablet TAKE 50 MG BY MOUTH THREE TIMES DAILY 270 tablet 2  . sildenafil (REVATIO) 20 MG tablet 20-100 mg qd prn 50 tablet 4  . vitamin B-12 (CYANOCOBALAMIN) 1000 MCG tablet Take 1,000 mcg by mouth daily.    . Avanafil (STENDRA) 100 MG TABS Take 100 mg by mouth daily as needed. (Patient not taking: Reported on 03/23/2017) 30 tablet 5   Facility-Administered Medications Prior to Visit  Medication Dose Route Frequency Provider Last Rate Last Dose  . ipratropium-albuterol (DUONEB) 0.5-2.5 (3) MG/3ML nebulizer solution 3 mL  3 mL Nebulization Once Janith Lima, MD        ROS Review of Systems  Constitutional: Negative for fatigue.  Respiratory: Negative for chest tightness and shortness of breath.     Objective:  BP 130/64   Pulse (!) 54   Temp 98.2 F (36.8 C)   Ht 5' 4.5" (1.638 m)   Wt 185 lb (83.9 kg)   SpO2 98%   BMI 31.26 kg/m   BP Readings from Last 3 Encounters:  03/23/17 130/64  03/01/17 138/82  12/22/16 168/68    Wt Readings from  Last 3 Encounters:  03/23/17 185 lb (83.9 kg)  03/01/17 186 lb (84.4 kg)  09/05/16 193 lb (87.5 kg)    Physical Exam  Constitutional: He is oriented to person, place, and time. He appears well-developed. No distress.  NAD  HENT:  Mouth/Throat: Oropharynx is clear and moist.  Eyes: Conjunctivae are normal. Pupils are equal, round, and reactive to light.  Neck: Normal range of motion. No JVD present. No thyromegaly present.  Cardiovascular: Normal rate, regular rhythm, normal heart sounds and intact distal pulses.  Exam reveals no gallop and no friction rub.   No murmur heard. Pulmonary/Chest: Effort normal and breath sounds normal. No respiratory distress. He has no wheezes. He has no rales. He exhibits no tenderness.  Abdominal: Soft. Bowel sounds are normal. He exhibits no distension and no mass. There is no tenderness. There is no rebound and no guarding.  Musculoskeletal: Normal range of motion. He exhibits no edema or tenderness.  Lymphadenopathy:    He has no cervical adenopathy.  Neurological: He is alert and oriented to person, place, and time. He has normal reflexes. No cranial nerve deficit. He exhibits normal muscle tone. He displays a negative Romberg sign. Coordination and gait normal.  Skin: Skin is warm and dry. No  rash noted.  Psychiatric: He has a normal mood and affect. His behavior is normal. Judgment and thought content normal.  AKs on nose    Procedure Note :     Procedure : Cryosurgery   Indication:   Actinic keratosis(es)   Risks including unsuccessful procedure , bleeding, infection, bruising, scar, a need for a repeat  procedure and others were explained to the patient in detail as well as the benefits. Informed consent was obtained verbally.   2  lesion(s)  on nose   was/were treated with liquid nitrogen on a Q-tip in a usual fasion . Band-Aid was applied and antibiotic ointment was given for a later use.   Tolerated well. Complications none.     Lab  Results  Component Value Date   WBC 7.2 03/06/2017   HGB 16.1 03/06/2017   HCT 47.4 03/06/2017   PLT 217.0 03/06/2017   GLUCOSE 94 03/06/2017   CHOL 181 03/06/2017   TRIG 119.0 03/06/2017   HDL 50.00 03/06/2017   LDLDIRECT 138.6 11/05/2014   LDLCALC 107 (H) 03/06/2017   ALT 38 03/06/2017   AST 34 03/06/2017   NA 141 03/06/2017   K 4.1 03/06/2017   CL 107 03/06/2017   CREATININE 0.89 03/06/2017   BUN 22 03/06/2017   CO2 27 03/06/2017   TSH 3.31 03/06/2017   PSA 0.87 03/06/2017   INR 1.10 06/22/2015   HGBA1C 5.7 03/06/2017    No results found.  Assessment & Plan:   There are no diagnoses linked to this encounter. I am having Mr. Towell maintain his vitamin B-12, aspirin EC, cholecalciferol, hydrALAZINE, atorvastatin, sildenafil, amLODipine, atenolol, Avanafil, and clonazePAM. We will continue to administer ipratropium-albuterol.  No orders of the defined types were placed in this encounter.    Follow-up: No Follow-up on file.  [name], MD

## 2017-03-23 NOTE — Progress Notes (Signed)
Pre visit review using our clinic review tool, if applicable. No additional management support is needed unless otherwise documented below in the visit note. 

## 2017-03-29 DIAGNOSIS — H01022 Squamous blepharitis right lower eyelid: Secondary | ICD-10-CM | POA: Diagnosis not present

## 2017-03-29 DIAGNOSIS — Z961 Presence of intraocular lens: Secondary | ICD-10-CM | POA: Diagnosis not present

## 2017-03-29 DIAGNOSIS — H02831 Dermatochalasis of right upper eyelid: Secondary | ICD-10-CM | POA: Diagnosis not present

## 2017-03-29 DIAGNOSIS — H35372 Puckering of macula, left eye: Secondary | ICD-10-CM | POA: Diagnosis not present

## 2017-03-29 DIAGNOSIS — H01025 Squamous blepharitis left lower eyelid: Secondary | ICD-10-CM | POA: Diagnosis not present

## 2017-03-29 DIAGNOSIS — H01021 Squamous blepharitis right upper eyelid: Secondary | ICD-10-CM | POA: Diagnosis not present

## 2017-03-29 DIAGNOSIS — H01024 Squamous blepharitis left upper eyelid: Secondary | ICD-10-CM | POA: Diagnosis not present

## 2017-03-29 DIAGNOSIS — H02834 Dermatochalasis of left upper eyelid: Secondary | ICD-10-CM | POA: Diagnosis not present

## 2017-04-07 ENCOUNTER — Telehealth: Payer: Self-pay | Admitting: Pulmonary Disease

## 2017-04-07 NOTE — Telephone Encounter (Signed)
Called and spoke with pt and he stated that he wanted to make sure that he was on the correct machine.  I advised him that he was last seen by VS 09/2014 and it is documented that he was on a BIPAP.  He is aware and nothing further is needed.

## 2017-04-19 ENCOUNTER — Telehealth: Payer: Self-pay | Admitting: *Deleted

## 2017-04-19 MED ORDER — CLONAZEPAM 0.5 MG PO TABS: 0.5000 mg | ORAL_TABLET | Freq: Two times a day (BID) | ORAL | 1 refills | Status: DC | PRN

## 2017-04-19 NOTE — Telephone Encounter (Signed)
Ok Thx 

## 2017-04-19 NOTE — Telephone Encounter (Signed)
Rec'd fax pt requesting refill on his Clonazepam.../lmb

## 2017-04-20 NOTE — Telephone Encounter (Signed)
Refill has been called into [facility]...[name]

## 2017-05-04 ENCOUNTER — Encounter: Payer: Self-pay | Admitting: Family

## 2017-05-10 ENCOUNTER — Encounter: Payer: Self-pay | Admitting: Family

## 2017-05-10 ENCOUNTER — Ambulatory Visit (HOSPITAL_COMMUNITY)
Admission: RE | Admit: 2017-05-10 | Discharge: 2017-05-10 | Disposition: A | Discharge status: Home / Self Care | Payer: PPO | Source: Ambulatory Visit | Attending: Family | Admitting: Family

## 2017-05-10 ENCOUNTER — Ambulatory Visit (INDEPENDENT_AMBULATORY_CARE_PROVIDER_SITE_OTHER): Payer: PPO | Admitting: Family

## 2017-05-10 ENCOUNTER — Ambulatory Visit (INDEPENDENT_AMBULATORY_CARE_PROVIDER_SITE_OTHER)
Admission: RE | Admit: 2017-05-10 | Discharge: 2017-05-10 | Disposition: A | Discharge status: Home / Self Care | Payer: PPO | Source: Ambulatory Visit | Attending: Family | Admitting: Family

## 2017-05-10 VITALS — BP 160/70 | HR 76 | Temp 98.0°F | Resp 18 | Ht 64.0 in | Wt 187.1 lb

## 2017-05-10 DIAGNOSIS — I70213 Atherosclerosis of native arteries of extremities with intermittent claudication, bilateral legs: Secondary | ICD-10-CM

## 2017-05-10 DIAGNOSIS — I779 Disorder of arteries and arterioles, unspecified: Secondary | ICD-10-CM | POA: Diagnosis not present

## 2017-05-10 DIAGNOSIS — Z95828 Presence of other vascular implants and grafts: Secondary | ICD-10-CM

## 2017-05-10 DIAGNOSIS — Z87891 Personal history of nicotine dependence: Secondary | ICD-10-CM | POA: Diagnosis not present

## 2017-05-10 DIAGNOSIS — Z8679 Personal history of other diseases of the circulatory system: Secondary | ICD-10-CM

## 2017-05-10 DIAGNOSIS — R0989 Other specified symptoms and signs involving the circulatory and respiratory systems: Secondary | ICD-10-CM | POA: Diagnosis not present

## 2017-05-10 NOTE — Progress Notes (Signed)
[facility] [place]   CC: Follow up peripheral artery occlusive disease  History of Present Illness Douglas Christensen is a 66 y.o. male patient of Dr. Trula Slade who returns for follow-up. On 01/29/2015, he underwent femoral endarterectomy with vein patch angioplasty, followed by atherectomy and stenting of his left superficial femoral artery. This was done for claudication. Overlapping 7 mm Cordis stents were placed. The patient did well postoperatively and was discharged to home.He has a history of right femoral to above-knee popliteal artery bypass graft with vein which was performed on 12/10/2012. He is taking a full dose aspirin currently as he had difficulty taking Plavix.  The patient was at the beach early Summer of 2017 and developed bilateral leg swelling. There was no inciting factor. He has been wearing compression stockings. He has no ulcers. His swelling is better with elevation. He underwent a CT scan while at the beach which showed patent right femoral-popliteal bypass graft and patent left femoral endarterectomy with stenting of the left superficial femoral artery. There was no evidence of DVT.  Pt was last evaluated by Dr. Trula Slade on 05-02-16. At that time report from CT scan showed a patent right femoral-popliteal bypass graft and a patent left femoral endarterectomy with left superficial femoral artery stenting The patient was doing well from Dr. Trula Slade perspective. Pt was to follow up in 6 months with bilateral lower extremity duplex to evaluate his interventions. Dr. Trula Slade encouraged him to continue wearing compression stockings for his leg swelling. Dr. Trula Slade did not think he needed to undergo additional venous insufficiency testing as he has already had his saphenous vein removed bilaterally.  Pt states his blood pressure at home is in the 140's/70's.   He had a CABG x 5 vessels in June 2014.  He denies any known hx of stroke or TIA.   He  denies claudication sx's with walking.    Pt Diabetic: No Pt smoker: former smoker, quit in 2008, started at age 29  Pt meds include: Statin :Yes Betablocker: Yes ASA: Yes Other anticoagulants/antiplatelets: no     Past Medical History:  Diagnosis Date  . Anxiety   . Arthritis   . CAD (coronary artery disease)    Mild plaque (cath "years ago"); abnormal Myoview 04/2013 with subsequent CABG x 5 with LIMA to LAD, SVG to OM1, SVG to DX, SVG to PD & PL.   Marland Kitchen COPD (chronic obstructive pulmonary disease) ([place])   . GERD (gastroesophageal reflux disease)   . History of colonic polyps   . Hx of echocardiogram    Echo (9/15):  EF 55-60%; Gr 2 DD, mild BAE  . Hyperlipidemia   . Hypertension   . LBP (low back pain)   . Meralgia paresthetica of left side 2011  . PVD (peripheral vascular disease) ([place])    Stent to left common femoral and right superficial femoral.  2008.  50%  left renal   . Shortness of breath    "once in awhile; can happen at anytime" (08/26/2013)  . Sleep apnea    mod OSA, central sleep apnea/hypoapnea syndrome 11/22/12, CPAP every night   . Vitamin D deficiency     Social History Social History  Substance Use Topics  . Smoking status: Former Smoker    Packs/day: 2.00    Years: 47.00    Types: Cigarettes    Quit date: 12/17/2012  . Smokeless tobacco: Never Used  . Alcohol use 2.4 oz/week    4 Shots of liquor per  week     Comment: 08/26/2013 "3-4 mixed drinks/wk"    Family History Family History  Problem Relation Age of Onset  . Heart disease Mother        No clear CAD and Heart Disease before age 47  . Hypertension Mother   . Cancer Mother        ? colon ca  . Hyperlipidemia Mother   . Cancer Father        brain tumor  . Alzheimer's disease Father   . Colon cancer Neg Hx   . Heart attack Neg Hx     Past Surgical History:  Procedure Laterality Date  . ABDOMINAL AORTAGRAM N/A 11/16/2011   Procedure: ABDOMINAL Maxcine Ham;  Surgeon: Sherren Mocha, MD;  Location: [facility] CATH LAB;  Service: Cardiovascular;  Laterality: N/A;  . ABDOMINAL AORTAGRAM N/A 11/14/2012   Procedure: ABDOMINAL Maxcine Ham;  Surgeon: Sherren Mocha, MD;  Location: [facility] CATH LAB;  Service: Cardiovascular;  Laterality: N/A;  . ABDOMINAL AORTAGRAM N/A 12/30/2014   Procedure: ABDOMINAL Maxcine Ham;  Surgeon: Serafina Mitchell, MD;  Location[facility] CATH LAB;  Service: Cardiovascular;  Laterality: N/A;  . CARDIAC CATHETERIZATION  05/02/13   x2   . CORONARY ARTERY BYPASS GRAFT N/A 05/06/2013   Procedure: CORONARY ARTERY BYPASS GRAFTING (CABG);  Surgeon: Melrose Nakayama, MD;  Location: [place];  Service: Open Heart Surgery;  Laterality: N/A;  Coronary artery bypass graft times five using left internal mammary artery and left greater saphenous vein via endovein harvest.  . ENDARTERECTOMY FEMORAL Left 01/29/2015   Procedure: ENDARTERECTOMY FEMORAL WITH PATCH ANGIOPLASTY;  Surgeon: Serafina Mitchell, MD;  Location: [place];  Service: Vascular;  Laterality: Left;  . EYE SURGERY  03/24/16   cataract surgery on left eye  . FEMORAL-POPLITEAL BYPASS GRAFT  12/27/2012   Procedure: BYPASS GRAFT FEMORAL-POPLITEAL ARTERY;  Surgeon: Serafina Mitchell, MD;  Location: [facility] OR;  Service: Vascular;  Laterality: Right;  using non-reversed sapphenous vein.  Marland Kitchen ILIAC ATHERECTOMY Left 01/29/2015   Procedure: SUPERFICIAL FEMORAL ARTERY ATHERECTOMY/PERCUTANEOUS TRANSLUMINAL ANGIOPLASTY; superficial femoral artery stent;  Surgeon: Serafina Mitchell, MD;  Location: [place];  Service: Vascular;  Laterality: Left;  . LOWER EXTREMITY ANGIOGRAM Bilateral 11/16/2011   Procedure: LOWER EXTREMITY ANGIOGRAM;  Surgeon: Sherren Mocha, MD;  Location: [facility] CATH LAB;  Service: Cardiovascular;  Laterality: Bilateral;  . lower extremity stents     bilateral lower extremities x 2  . PERCUTANEOUS STENT INTERVENTION Left 11/16/2011   Procedure: PERCUTANEOUS STENT INTERVENTION;  Surgeon: Sherren Mocha, MD;  Location: [facility] CATH LAB;  Service:  Cardiovascular;  Laterality: Left;  . TONSILLECTOMY    . TOTAL HIP ARTHROPLASTY Left 06/29/2015   Procedure: TOTAL HIP ARTHROPLASTY;  Surgeon: Frederik Pear, MD;  Location: [place];  Service: Orthopedics;  Laterality: Left;  LEFT TOTAL HIP ARTHROPLASTY DEPUY SROM/PINNACLE    Allergies  Allergen Reactions  . Roxicodone [Oxycodone Hcl] Other (See Comments)    hallucinations  . Benazepril Cough  . Itraconazole Nausea Only and Rash    Current Outpatient Prescriptions  Medication Sig Dispense Refill  . amLODipine (NORVASC) 5 MG tablet Take 1 tablet (5 mg total) by mouth daily. 90 tablet 3  . aspirin EC 325 MG tablet Take 1 tablet (325 mg total) by mouth 2 (two) times daily. 30 tablet 0  . atenolol (TENORMIN) 50 MG tablet Take 1 tablet (50 mg total) by mouth daily. 90 tablet 3  . atorvastatin (LIPITOR) 20 MG tablet Take 1 tablet (20 mg total) by mouth  daily. 90 tablet 3  . Avanafil (STENDRA) 100 MG TABS Take 100 mg by mouth daily as needed. 30 tablet 5  . cholecalciferol (VITAMIN D) 1000 units tablet Take 2 tablets (2,000 Units total) by mouth daily. 100 tablet 3  . clonazePAM (KLONOPIN) 0.5 MG tablet Take 1 tablet (0.5 mg total) by mouth 2 (two) times daily as needed for anxiety. 60 tablet 1  . hydrALAZINE (APRESOLINE) 50 MG tablet TAKE 50 MG BY MOUTH THREE TIMES DAILY 270 tablet 3  . Omega-3 Fatty Acids (FISH OIL BURP-LESS PO) Take by mouth daily.    . sildenafil (REVATIO) 20 MG tablet 20-100 mg qd prn 50 tablet 4  . vitamin B-12 (CYANOCOBALAMIN) 1000 MCG tablet Take 1,000 mcg by mouth daily.     Current Facility-Administered Medications  Medication Dose Route Frequency Provider Last Rate Last Dose  . ipratropium-albuterol (DUONEB) 0.5-2.5 (3) MG/3ML nebulizer solution 3 mL  3 mL Nebulization Once Janith Lima, MD        ROS: See HPI for pertinent positives and negatives.   Physical Examination  Vitals:   05/10/17 1258 05/10/17 1303  BP: (!) 158/90 (!) 160/70  Pulse: 76   Resp: 18    Temp: 98 F (36.7 C)   TempSrc: Oral   SpO2: 96%   Weight: 187 lb 1.6 oz (84.9 kg)   Height: 5\' 4"  (1.626 m)    Body mass index is 32.12 kg/m.  General: A&O x 3, WDWN, obese male. Gait: normal Eyes: Pupils equal. Pulmonary: Respirations are non labored, CTAB, without wheezes, rales, or rhonchi. Cardiac: regular rythm, no detected murmur.     Carotid Bruits Left Right   Negative Negative  Aorta is not palpable. Radial pulses: are 2+ palpable and =.   VASCULAR EXAM: Extremitieswithout ischemic changes  without Gangrene; without open wounds.     LE Pulses LEFT RIGHT   FEMORAL 2+ palpable 2+ palpable    POPLITEAL not palpable  not palpable   POSTERIOR TIBIAL 2+ palpable  2+ palpable    DORSALIS PEDIS  ANTERIOR TIBIAL not palpable  2+ palpable    Abdomen: soft, NT, no palpable masses. Skin: no rashes, no ulcers noted. Musculoskeletal: no muscle wasting or atrophy. Neurologic: A&O X 3; Appropriate Affect ; SENSATION: normal; MOTOR FUNCTION: moving all extremities equally, motor strength 5/5 throughout. Speech is fluent/normal. CN 2-12      ASSESSMENT: SHADEN LACHER is a 66 y.o. male who is s/p right SFA stent placement in 2008; left iliac artery stent placement in 2013; right femoral to above knee poplieteal artery bypass graft on 12-11-12; left EIA, common femoral, profunda femoral, and SFA endarterectomy/angioplasty and left SFA stent placed on 01-29-15.   He has no claudication sx's with walking.   DATA  Bilateal LE Arterial Duplex (05/10/17): Right LE bypass graft with no restenosis. Left SFA stent with elevated velocities in the proximal segment (30-49%, 267 cm/s, triphasic waveforms at this segment), otherwise biphasic waveforms.   No significant change compared to the last exam on 11-07-16.     ABI (Date: 05/10/2017)  R:   ABI: 1.00 (1.02 on 11-07-16),   PT: tri  DP: bi  TBI:  0.83  L:   ABI: 1.06 (was 1.02),   PT: tri  DP: tri  TBI: 0.81 Bilateral ABI's remain 100% with mostly triphasic waveforms.     PLAN:  Continued graduated walking program. Based on the patient's vascular studies and examination, pt will return to clinic in 1 year with  ABI's and bilateral LE arterial duplex. I advised him to notify us if he develops concerns re the circulation in his feet or legs.   I discussed in depth with the patient the nature of atherosclerosis, and emphasized the importance of maximal medical management including strict control of blood pressure, blood glucose, and lipid levels, obtaining regular exercise, and continued cessation of smoking.  The patient is aware that without maximal medical management the underlying atherosclerotic disease process will progress, limiting the benefit of any interventions.  The patient was given information about PAD including signs, symptoms, treatment, what symptoms should prompt the patient to seek immediate medical care, and risk reduction measures to take.  Clemon Chambers, RN, MSN, FNP-C [facility] Phone: [phone]  Clinic MD: Trula Slade  05/10/17 1:36 PM

## 2017-05-10 NOTE — Patient Instructions (Signed)

## 2017-05-15 ENCOUNTER — Telehealth: Payer: Self-pay | Admitting: Pulmonary Disease

## 2017-05-15 DIAGNOSIS — G4733 Obstructive sleep apnea (adult) (pediatric): Secondary | ICD-10-CM

## 2017-05-15 NOTE — Telephone Encounter (Signed)
Pt is requesting cpap supply. Pt states his frame on his current mask is broken. Pt states mask has been broken for 1 month. Pt last seen 10/20/14 and instructed to f/u in 1 year. It does not appear that recall was placed. I have offered apt for 05/17/17, pt declined due to being out of town. First available 06/09/17.  VS please advise. Thanks.

## 2017-05-15 NOTE — Telephone Encounter (Signed)
Okay to send order for new mask.  Please schedule for ROV in July.

## 2017-05-15 NOTE — Telephone Encounter (Signed)
Spoke with pt who states Douglas Christensen who called in, is from his insurance. Pt was informed of VS message and agreed and wanted [place] to proceed with placing the order. The order was placed. Pt was also scheduled for f/u appt with NP due to VS not having any open slots. Pt had no further questions at this time. Nothing further is needed

## 2017-05-16 ENCOUNTER — Telehealth: Payer: Self-pay | Admitting: Pulmonary Disease

## 2017-05-16 NOTE — Telephone Encounter (Signed)
PCCs  Is there any way you can fax this pt's sleep studies to [facility] in [place].

## 2017-05-16 NOTE — Telephone Encounter (Signed)
Sleep Studies have been faxed to [place] at [facility]

## 2017-05-18 NOTE — Addendum Note (Signed)
Addended by: Lianne Cure A on: 05/18/2017 10:18 AM   Modules accepted: Orders

## 2017-05-19 DIAGNOSIS — G4733 Obstructive sleep apnea (adult) (pediatric): Secondary | ICD-10-CM | POA: Diagnosis not present

## 2017-06-09 ENCOUNTER — Ambulatory Visit: Payer: PPO | Admitting: Adult Health

## 2017-06-20 ENCOUNTER — Emergency Department (HOSPITAL_COMMUNITY)
Admission: EM | Admit: 2017-06-20 | Discharge: 2017-06-20 | Disposition: A | Discharge status: Home / Self Care | Payer: PPO | Attending: Physician Assistant | Admitting: Physician Assistant

## 2017-06-20 ENCOUNTER — Encounter (HOSPITAL_COMMUNITY): Payer: Self-pay | Admitting: *Deleted

## 2017-06-20 DIAGNOSIS — Z87891 Personal history of nicotine dependence: Secondary | ICD-10-CM | POA: Diagnosis not present

## 2017-06-20 DIAGNOSIS — J449 Chronic obstructive pulmonary disease, unspecified: Secondary | ICD-10-CM | POA: Insufficient documentation

## 2017-06-20 DIAGNOSIS — I2510 Atherosclerotic heart disease of native coronary artery without angina pectoris: Secondary | ICD-10-CM | POA: Insufficient documentation

## 2017-06-20 DIAGNOSIS — I10 Essential (primary) hypertension: Secondary | ICD-10-CM | POA: Insufficient documentation

## 2017-06-20 DIAGNOSIS — R001 Bradycardia, unspecified: Secondary | ICD-10-CM | POA: Diagnosis not present

## 2017-06-20 DIAGNOSIS — E785 Hyperlipidemia, unspecified: Secondary | ICD-10-CM | POA: Diagnosis not present

## 2017-06-20 NOTE — ED Notes (Signed)
Pt given sandwich and water, OK per [place] [facility]

## 2017-06-20 NOTE — ED Triage Notes (Addendum)
Pt reports high blood pressure with sys >200 onset today @ 11:30, denies CP, SOB, n/v/d, pt A&O x4, denies unilateral numbness or tingling with symptoms, no slurred speech present, no facial droop present, no headache or vision changes noted, pt states,"I have had 5 bypasses and my heart feels like it is racing."

## 2017-06-20 NOTE — Discharge Instructions (Addendum)
Taking her blood pressure medications as directed.  Monitor blood pressure for any abnormalities.  Follow-up with her primary care doctor next 24-48 hours for further evaluation.  Follow-up with her cardiologist next 24-48 hours for further evaluation.  Return the emergency room for any worsening high blood pressures, chest pain, difficulty breathing, vision changes, numbness/weakness of her arms or legs or any other worsening or concerning symptoms.
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
## 2017-06-21 ENCOUNTER — Telehealth: Payer: Self-pay | Admitting: Internal Medicine

## 2017-06-21 MED ORDER — ATENOLOL 50 MG PO TABS: 50.0000 mg | ORAL_TABLET | Freq: Two times a day (BID) | ORAL | 3 refills | Status: DC

## 2017-06-21 NOTE — Telephone Encounter (Signed)
Pt called to schedule an appointment with Dr Alain Marion due to having high blood pressure. He said that yesterday it was 244/102 and he went to hospital but they could not find anything. This morning it was 191/77. They told him to follow up with his PCP. The first available time that I could get him in is on 06/30/17. He did not want to be seen by anyone else. He wanted to make sure that nothing should be changed before his appointment. He is aware that Dr Alain Marion will be out of the office until next Wednesday.

## 2017-06-21 NOTE — Telephone Encounter (Signed)
Notified pt w/MD response. MD sent new script to pof....[name]

## 2017-06-21 NOTE — Telephone Encounter (Signed)
Forwarding to PCP for his recommendation...Douglas Christensen

## 2017-06-21 NOTE — Telephone Encounter (Signed)
Start Tenormin 50 mg bid Keep ROV Thx

## 2017-06-30 ENCOUNTER — Other Ambulatory Visit (INDEPENDENT_AMBULATORY_CARE_PROVIDER_SITE_OTHER): Payer: PPO

## 2017-06-30 ENCOUNTER — Encounter: Payer: Self-pay | Admitting: Internal Medicine

## 2017-06-30 ENCOUNTER — Ambulatory Visit (INDEPENDENT_AMBULATORY_CARE_PROVIDER_SITE_OTHER): Payer: PPO | Admitting: Internal Medicine

## 2017-06-30 DIAGNOSIS — D751 Secondary polycythemia: Secondary | ICD-10-CM | POA: Diagnosis not present

## 2017-06-30 DIAGNOSIS — I119 Hypertensive heart disease without heart failure: Secondary | ICD-10-CM

## 2017-06-30 DIAGNOSIS — E538 Deficiency of other specified B group vitamins: Secondary | ICD-10-CM

## 2017-06-30 DIAGNOSIS — J441 Chronic obstructive pulmonary disease with (acute) exacerbation: Secondary | ICD-10-CM

## 2017-06-30 DIAGNOSIS — R0609 Other forms of dyspnea: Secondary | ICD-10-CM | POA: Diagnosis not present

## 2017-06-30 DIAGNOSIS — I160 Hypertensive urgency: Secondary | ICD-10-CM | POA: Diagnosis not present

## 2017-06-30 DIAGNOSIS — L719 Rosacea, unspecified: Secondary | ICD-10-CM

## 2017-06-30 LAB — CBC WITH DIFFERENTIAL/PLATELET
BASOS ABS: 0.1 10*3/uL (ref 0.0–0.1)
Basophils Relative: 1 % (ref 0.0–3.0)
EOS PCT: 3.2 % (ref 0.0–5.0)
Eosinophils Absolute: 0.3 10*3/uL (ref 0.0–0.7)
HEMATOCRIT: 45.4 % (ref 39.0–52.0)
Hemoglobin: 15.6 g/dL (ref 13.0–17.0)
LYMPHS ABS: 2.1 10*3/uL (ref 0.7–4.0)
LYMPHS PCT: 22.2 % (ref 12.0–46.0)
MCHC: 34.5 g/dL (ref 30.0–36.0)
MCV: 96.5 fl (ref 78.0–100.0)
MONOS PCT: 10.3 % (ref 3.0–12.0)
Monocytes Absolute: 1 10*3/uL (ref 0.1–1.0)
NEUTROS ABS: 6 10*3/uL (ref 1.4–7.7)
NEUTROS PCT: 63.3 % (ref 43.0–77.0)
Platelets: 215 10*3/uL (ref 150.0–400.0)
RBC: 4.7 Mil/uL (ref 4.22–5.81)
RDW: 13.6 % (ref 11.5–15.5)
WBC: 9.5 10*3/uL (ref 4.0–10.5)

## 2017-06-30 LAB — HEPATIC FUNCTION PANEL
ALK PHOS: 49 U/L (ref 39–117)
ALT: 38 U/L (ref 0–53)
AST: 33 U/L (ref 0–37)
Albumin: 4.3 g/dL (ref 3.5–5.2)
BILIRUBIN DIRECT: 0.1 mg/dL (ref 0.0–0.3)
TOTAL PROTEIN: 7.1 g/dL (ref 6.0–8.3)
Total Bilirubin: 0.7 mg/dL (ref 0.2–1.2)

## 2017-06-30 LAB — BASIC METABOLIC PANEL
BUN: 19 mg/dL (ref 6–23)
CO2: 26 meq/L (ref 19–32)
Calcium: 9.5 mg/dL (ref 8.4–10.5)
Chloride: 106 mEq/L (ref 96–112)
Creatinine, Ser: 0.98 mg/dL (ref 0.40–1.50)
GFR: 81.25 mL/min (ref >60.00)
GLUCOSE: 97 mg/dL (ref 70–99)
POTASSIUM: 3.8 meq/L (ref 3.5–5.1)
SODIUM: 139 meq/L (ref 135–145)

## 2017-06-30 LAB — TSH: TSH: 3.33 u[IU]/mL (ref 0.35–4.50)

## 2017-06-30 LAB — TESTOSTERONE: TESTOSTERONE: 273.88 ng/dL — AB (ref 300.00–890.00)

## 2017-06-30 MED ORDER — DOXYCYCLINE HYCLATE 100 MG PO TABS: 100.0000 mg | ORAL_TABLET | Freq: Two times a day (BID) | ORAL | 1 refills | Status: DC

## 2017-06-30 MED ORDER — TRIAMTERENE-HCTZ 37.5-25 MG PO TABS: 1.0000 | ORAL_TABLET | Freq: Every day | ORAL | 11 refills | Status: DC

## 2017-06-30 NOTE — Assessment & Plan Note (Signed)
Doxy CBC

## 2017-06-30 NOTE — Progress Notes (Signed)
Subjective:  Patient ID: [name], male    DOB: Dec 05, 1950  Age: 66 y.o. MRN: [id number]  CC: No chief complaint on file.   HPI DEACON GADBOIS presents for severe HTN. He went to ER on 7/24  BP was 244/? At home on that day F/u CAD, dyslipidemia Using CPAP every night  Outpatient Medications Prior to Visit  Medication Sig Dispense Refill  . amLODipine (NORVASC) 5 MG tablet Take 1 tablet (5 mg total) by mouth daily. 90 tablet 3  . aspirin EC 325 MG tablet Take 1 tablet (325 mg total) by mouth 2 (two) times daily. 30 tablet 0  . atenolol (TENORMIN) 50 MG tablet Take 1 tablet (50 mg total) by mouth 2 (two) times daily. 180 tablet 3  . atorvastatin (LIPITOR) 20 MG tablet Take 1 tablet (20 mg total) by mouth daily. 90 tablet 3  . Avanafil (STENDRA) 100 MG TABS Take 100 mg by mouth daily as needed. 30 tablet 5  . cholecalciferol (VITAMIN D) 1000 units tablet Take 2 tablets (2,000 Units total) by mouth daily. 100 tablet 3  . clonazePAM (KLONOPIN) 0.5 MG tablet Take 1 tablet (0.5 mg total) by mouth 2 (two) times daily as needed for anxiety. 60 tablet 1  . hydrALAZINE (APRESOLINE) 50 MG tablet TAKE 50 MG BY MOUTH THREE TIMES DAILY 270 tablet 3  . sildenafil (REVATIO) 20 MG tablet 20-100 mg qd prn 50 tablet 4  . vitamin B-12 (CYANOCOBALAMIN) 1000 MCG tablet Take 1,000 mcg by mouth daily.    . Omega-3 Fatty Acids (FISH OIL BURP-LESS PO) Take by mouth daily.     Facility-Administered Medications Prior to Visit  Medication Dose Route Frequency Provider Last Rate Last Dose  . ipratropium-albuterol (DUONEB) 0.5-2.5 (3) MG/3ML nebulizer solution 3 mL  3 mL Nebulization Once Janith Lima, MD        ROS Review of Systems  Constitutional: Positive for fatigue and unexpected weight change. Negative for appetite change.  HENT: Negative for congestion, nosebleeds, sneezing, sore throat and trouble swallowing.   Eyes: Negative for itching and visual disturbance.  Respiratory: Negative for cough.     Cardiovascular: Negative for chest pain, palpitations and leg swelling.  Gastrointestinal: Negative for abdominal distention, blood in stool, diarrhea and nausea.  Genitourinary: Negative for frequency and hematuria.  Musculoskeletal: Negative for back pain, gait problem, joint swelling and neck pain.  Skin: Positive for color change. Negative for rash.  Neurological: Positive for dizziness. Negative for tremors, speech difficulty and weakness.  Psychiatric/Behavioral: Negative for agitation, dysphoric mood and sleep disturbance. The patient is not nervous/anxious.     Objective:  BP (!) 160/62 (BP Location: Left Arm, Patient Position: Sitting, Cuff Size: Large)   Pulse (!) 57   Temp 98.5 F (36.9 C) (Oral)   Ht 5\' 4"  (1.626 m)   Wt 195 lb (88.5 kg)   SpO2 97%   BMI 33.47 kg/m   BP Readings from Last 3 Encounters:  06/30/17 (!) 160/62  06/20/17 (!) 176/76  05/10/17 (!) 160/70    Wt Readings from Last 3 Encounters:  06/30/17 195 lb (88.5 kg)  05/10/17 187 lb 1.6 oz (84.9 kg)  03/23/17 185 lb (83.9 kg)    Physical Exam  Constitutional: He is oriented to person, place, and time. He appears well-developed. No distress.  NAD  HENT:  Mouth/Throat: Oropharynx is clear and moist.  Eyes: Pupils are equal, round, and reactive to light. Conjunctivae are normal.  Neck: Normal range of  motion. No JVD present. No thyromegaly present.  Cardiovascular: Normal rate, regular rhythm, normal heart sounds and intact distal pulses.  Exam reveals no gallop and no friction rub.   No murmur heard. Pulmonary/Chest: Effort normal and breath sounds normal. No respiratory distress. He has no wheezes. He has no rales. He exhibits no tenderness.  Abdominal: Soft. Bowel sounds are normal. He exhibits no distension and no mass. There is no tenderness. There is no rebound and no guarding.  Musculoskeletal: Normal range of motion. He exhibits no edema or tenderness.  Lymphadenopathy:    He has no  cervical adenopathy.  Neurological: He is alert and oriented to person, place, and time. He has normal reflexes. No cranial nerve deficit. He exhibits normal muscle tone. He displays a negative Romberg sign. Coordination and gait normal.  Skin: Skin is warm and dry. Rash noted. There is erythema.  Psychiatric: He has a normal mood and affect. His behavior is normal. Judgment and thought content normal.  Obese Purplish face Acne on face   Lab Results  Component Value Date   WBC 7.2 03/06/2017   HGB 16.1 03/06/2017   HCT 47.4 03/06/2017   PLT 217.0 03/06/2017   GLUCOSE 94 03/06/2017   CHOL 181 03/06/2017   TRIG 119.0 03/06/2017   HDL 50.00 03/06/2017   LDLDIRECT 138.6 11/05/2014   LDLCALC 107 (H) 03/06/2017   ALT 38 03/06/2017   AST 34 03/06/2017   NA 141 03/06/2017   K 4.1 03/06/2017   CL 107 03/06/2017   CREATININE 0.89 03/06/2017   BUN 22 03/06/2017   CO2 27 03/06/2017   TSH 3.31 03/06/2017   PSA 0.87 03/06/2017   INR 1.10 06/22/2015   HGBA1C 5.7 03/06/2017    No results found.  Assessment & Plan:   There are no diagnoses linked to this encounter. I have discontinued Mr. Vangorder Omega-3 Fatty Acids (FISH OIL BURP-LESS PO). I am also having him maintain his vitamin B-12, aspirin EC, cholecalciferol, sildenafil, Avanafil, amLODipine, hydrALAZINE, atorvastatin, clonazePAM, and atenolol. We will continue to administer ipratropium-albuterol.  No orders of the defined types were placed in this encounter.    Follow-up: No Follow-up on file.  [name], MD

## 2017-06-30 NOTE — Assessment & Plan Note (Signed)
Labs Loose wt Added Maxzide May need a phlebotomy

## 2017-06-30 NOTE — Assessment & Plan Note (Signed)
CBC

## 2017-06-30 NOTE — Assessment & Plan Note (Signed)
CXR

## 2017-06-30 NOTE — Assessment & Plan Note (Signed)
No change CXR

## 2017-06-30 NOTE — Assessment & Plan Note (Signed)
On B12 

## 2017-07-04 ENCOUNTER — Encounter: Payer: Self-pay | Admitting: Adult Health

## 2017-07-04 ENCOUNTER — Ambulatory Visit (INDEPENDENT_AMBULATORY_CARE_PROVIDER_SITE_OTHER): Payer: PPO | Admitting: Adult Health

## 2017-07-04 VITALS — BP 144/64 | HR 62 | Ht 64.0 in | Wt 189.0 lb

## 2017-07-04 DIAGNOSIS — G4731 Primary central sleep apnea: Secondary | ICD-10-CM | POA: Diagnosis not present

## 2017-07-04 DIAGNOSIS — E669 Obesity, unspecified: Secondary | ICD-10-CM | POA: Diagnosis not present

## 2017-07-04 NOTE — Assessment & Plan Note (Signed)
Doing well on BIPAP  Download requested   Plan  Patient Instructions  Continue on BiPAP at bedtime. Keep up the good work. Do not drive sleepy Continue to be active and work on weight loss Follow up Dr. Halford Chessman in 1 year and as needed

## 2017-07-04 NOTE — Assessment & Plan Note (Signed)
Wt loss  

## 2017-07-04 NOTE — Progress Notes (Signed)
I have reviewed and agree with assessment/plan.  Chesley Mires, MD [facility] Pulmonary/Critical Care 07/04/2017, 11:14 PM Pager:  [phone]

## 2017-07-04 NOTE — Progress Notes (Signed)
@Patient  ID: Douglas Christensen, male    DOB: 1951-08-10, 66 y.o.   MRN: [id number]  Chief Complaint  Patient presents with  . Follow-up    OSA     Referring provider: Plotnikov, Evie Lacks, MD  HPI: 66 year old male former smoker followed for complex sleep apnea on BiPAP at bedtime  TEST   Tests: HST 11/23/12 >> AHI 15.8, SpO2 low 83%, both obstructive and central events BiPAP 02/21/14 >> BiPAP 17/13 cm H2O >> AHI 0, + R. Centrals with lower pressures. BiPAP 09/20/14 to 10/19/14 >> used on 30 of 30 nights with average 6 hrs 9 min.  Average AHI 3.3 with BiPAP 14/10 cm H2O  07/04/2017 Follow up : OSA  Patient presents for a follow-up for sleep apnea. Patient was last seen in November 2015. Patient says he's doing very well on BiPAP wears every single night. Feels rested with no significant daytime sleepiness. Unable to do BiPAP download today in office a order was sent for download to his home care company. Says he can not sleep without his machine. Uses 8 hr each night .  We discussed weight loss.   Allergies  Allergen Reactions  . Roxicodone [Oxycodone Hcl] Other (See Comments)    hallucinations  . Benazepril Cough  . Itraconazole Nausea Only and Rash    Immunization History  Administered Date(s) Administered  . Influenza Split 12/28/2012  . Influenza Whole 08/18/2010  . Influenza, High Dose Seasonal PF 09/05/2016  . Influenza,inj,Quad PF,36+ Mos 07/22/2015  . Influenza-Unspecified 01/16/2014  . Pneumococcal Conjugate-13 01/27/2014  . Pneumococcal Polysaccharide-23 08/28/2006  . Tdap 03/01/2017    Past Medical History:  Diagnosis Date  . Anxiety   . Arthritis   . CAD (coronary artery disease)    Mild plaque (cath "years ago"); abnormal Myoview 04/2013 with subsequent CABG x 5 with LIMA to LAD, SVG to OM1, SVG to DX, SVG to PD & PL.   Marland Kitchen COPD (chronic obstructive pulmonary disease) ([place])   . GERD (gastroesophageal reflux disease)   . History of colonic polyps   . Hx of  echocardiogram    Echo (9/15):  EF 55-60%; Gr 2 DD, mild BAE  . Hyperlipidemia   . Hypertension   . LBP (low back pain)   . Meralgia paresthetica of left side 2011  . PVD (peripheral vascular disease) ([place])    Stent to left common femoral and right superficial femoral.  2008.  50%  left renal   . Shortness of breath    "once in awhile; can happen at anytime" (08/26/2013)  . Sleep apnea    mod OSA, central sleep apnea/hypoapnea syndrome 11/22/12, CPAP every night   . Vitamin D deficiency     Tobacco History: History  Smoking Status  . Former Smoker  . Packs/day: 2.00  . Years: 47.00  . Types: Cigarettes  . Quit date: 12/17/2012  Smokeless Tobacco  . Never Used   Counseling given: Not Answered   Outpatient Encounter Prescriptions as of 07/04/2017  Medication Sig  . amLODipine (NORVASC) 5 MG tablet Take 1 tablet (5 mg total) by mouth daily.  Marland Kitchen aspirin EC 325 MG tablet Take 1 tablet (325 mg total) by mouth 2 (two) times daily.  Marland Kitchen atenolol (TENORMIN) 50 MG tablet Take 1 tablet (50 mg total) by mouth 2 (two) times daily.  Marland Kitchen atorvastatin (LIPITOR) 20 MG tablet Take 1 tablet (20 mg total) by mouth daily.  . Avanafil (STENDRA) 100 MG TABS Take 100 mg by mouth daily  as needed.  . cholecalciferol (VITAMIN D) 1000 units tablet Take 2 tablets (2,000 Units total) by mouth daily.  . clonazePAM (KLONOPIN) 0.5 MG tablet Take 1 tablet (0.5 mg total) by mouth 2 (two) times daily as needed for anxiety.  . hydrALAZINE (APRESOLINE) 50 MG tablet TAKE 50 MG BY MOUTH THREE TIMES DAILY  . sildenafil (REVATIO) 20 MG tablet 20-100 mg qd prn  . triamterene-hydrochlorothiazide (MAXZIDE-25) 37.5-25 MG tablet Take 1 tablet by mouth daily.  . vitamin B-12 (CYANOCOBALAMIN) 1000 MCG tablet Take 1,000 mcg by mouth daily.  Marland Kitchen doxycycline (VIBRA-TABS) 100 MG tablet Take 1 tablet (100 mg total) by mouth 2 (two) times daily. (Patient not taking: Reported on 07/04/2017)   Facility-Administered Encounter Medications as  of 07/04/2017  Medication  . ipratropium-albuterol (DUONEB) 0.5-2.5 (3) MG/3ML nebulizer solution 3 mL     Review of Systems  Constitutional:   No  weight loss, night sweats,  Fevers, chills, fatigue, or  lassitude.  HEENT:   No headaches,  Difficulty swallowing,  Tooth/dental problems, or  Sore throat,                No sneezing, itching, ear ache, nasal congestion, post nasal drip,   CV:  No chest pain,  Orthopnea, PND, swelling in lower extremities, anasarca, dizziness, palpitations, syncope.   GI  No heartburn, indigestion, abdominal pain, nausea, vomiting, diarrhea, change in bowel habits, loss of appetite, bloody stools.   Resp: No shortness of breath with exertion or at rest.  No excess mucus, no productive cough,  No non-productive cough,  No coughing up of blood.  No change in color of mucus.  No wheezing.  No chest wall deformity  Skin: no rash or lesions.  GU: no dysuria, change in color of urine, no urgency or frequency.  No flank pain, no hematuria   MS:  No joint pain or swelling.  No decreased range of motion.  No back pain.    Physical Exam  BP (!) 144/64 (BP Location: Left Arm, Cuff Size: Normal)   Pulse 62   Ht 5\' 4"  (1.626 m)   Wt 189 lb (85.7 kg)   SpO2 95%   BMI 32.44 kg/m   GEN: A/Ox3; pleasant , NAD, obese    HEENT:  [place]/AT,  EACs-clear, TMs-wnl, NOSE-clear, THROAT-clear, no lesions, no postnasal drip or exudate noted. Class 2-3 MP airway   NECK:  Supple w/ fair ROM; no JVD; normal carotid impulses w/o bruits; no thyromegaly or nodules palpated; no lymphadenopathy.    RESP  Clear  P & A; w/o, wheezes/ rales/ or rhonchi. no accessory muscle use, no dullness to percussion  CARD:  RRR, no m/r/g, tr  peripheral edema, pulses intact, no cyanosis or clubbing.  GI:   Soft & nt; nml bowel sounds; no organomegaly or masses detected.   Musco: Warm bil, no deformities or joint swelling noted.   Neuro: alert, no focal deficits noted.    Skin: Warm, no  lesions or rashes    Lab Results:  CBC  BMET  No results found for: PROBNP  Imaging: No results found.   Assessment & Plan:   Complex sleep apnea syndrome Doing well on BIPAP  Download requested   Plan  Patient Instructions  Continue on BiPAP at bedtime. Keep up the good work. Do not drive sleepy Continue to be active and work on weight loss Follow up Dr. Halford Chessman in 1 year and as needed     Mildly obese Wt loss  Rexene Edison, NP 07/04/2017

## 2017-07-04 NOTE — Patient Instructions (Addendum)
Continue on BiPAP at bedtime. Keep up the good work. Do not drive sleepy Continue to be active and work on weight loss Follow up Dr. Halford Chessman in 1 year and as needed

## 2017-07-10 ENCOUNTER — Encounter: Payer: Self-pay | Admitting: *Deleted

## 2017-07-28 ENCOUNTER — Ambulatory Visit (INDEPENDENT_AMBULATORY_CARE_PROVIDER_SITE_OTHER): Payer: PPO | Admitting: Cardiovascular Disease

## 2017-07-28 ENCOUNTER — Encounter (HOSPITAL_COMMUNITY): Payer: Self-pay | Admitting: *Deleted

## 2017-07-28 ENCOUNTER — Inpatient Hospital Stay (HOSPITAL_COMMUNITY)
Admission: EM | Admit: 2017-07-28 | Discharge: 2017-07-30 | DRG: 310 | Disposition: A | Discharge status: Home / Self Care | Payer: PPO | Source: Ambulatory Visit | Attending: Internal Medicine | Admitting: Internal Medicine

## 2017-07-28 ENCOUNTER — Encounter: Payer: Self-pay | Admitting: Cardiovascular Disease

## 2017-07-28 VITALS — BP 132/60 | HR 40 | Ht 65.0 in | Wt 192.0 lb

## 2017-07-28 DIAGNOSIS — Z888 Allergy status to other drugs, medicaments and biological substances status: Secondary | ICD-10-CM | POA: Diagnosis not present

## 2017-07-28 DIAGNOSIS — Z8349 Family history of other endocrine, nutritional and metabolic diseases: Secondary | ICD-10-CM | POA: Diagnosis not present

## 2017-07-28 DIAGNOSIS — Z87891 Personal history of nicotine dependence: Secondary | ICD-10-CM

## 2017-07-28 DIAGNOSIS — Z96642 Presence of left artificial hip joint: Secondary | ICD-10-CM | POA: Diagnosis present

## 2017-07-28 DIAGNOSIS — Z86010 Personal history of colonic polyps: Secondary | ICD-10-CM | POA: Diagnosis not present

## 2017-07-28 DIAGNOSIS — I2510 Atherosclerotic heart disease of native coronary artery without angina pectoris: Secondary | ICD-10-CM

## 2017-07-28 DIAGNOSIS — E785 Hyperlipidemia, unspecified: Secondary | ICD-10-CM | POA: Diagnosis present

## 2017-07-28 DIAGNOSIS — I119 Hypertensive heart disease without heart failure: Secondary | ICD-10-CM | POA: Diagnosis present

## 2017-07-28 DIAGNOSIS — E559 Vitamin D deficiency, unspecified: Secondary | ICD-10-CM | POA: Diagnosis present

## 2017-07-28 DIAGNOSIS — M1990 Unspecified osteoarthritis, unspecified site: Secondary | ICD-10-CM | POA: Diagnosis present

## 2017-07-28 DIAGNOSIS — R001 Bradycardia, unspecified: Secondary | ICD-10-CM | POA: Diagnosis present

## 2017-07-28 DIAGNOSIS — F419 Anxiety disorder, unspecified: Secondary | ICD-10-CM | POA: Diagnosis present

## 2017-07-28 DIAGNOSIS — I739 Peripheral vascular disease, unspecified: Secondary | ICD-10-CM

## 2017-07-28 DIAGNOSIS — Z8249 Family history of ischemic heart disease and other diseases of the circulatory system: Secondary | ICD-10-CM | POA: Diagnosis not present

## 2017-07-28 DIAGNOSIS — G4733 Obstructive sleep apnea (adult) (pediatric): Secondary | ICD-10-CM | POA: Diagnosis present

## 2017-07-28 DIAGNOSIS — Z951 Presence of aortocoronary bypass graft: Secondary | ICD-10-CM

## 2017-07-28 DIAGNOSIS — K219 Gastro-esophageal reflux disease without esophagitis: Secondary | ICD-10-CM | POA: Diagnosis present

## 2017-07-28 DIAGNOSIS — I441 Atrioventricular block, second degree: Principal | ICD-10-CM | POA: Diagnosis present

## 2017-07-28 DIAGNOSIS — I10 Essential (primary) hypertension: Secondary | ICD-10-CM | POA: Diagnosis not present

## 2017-07-28 DIAGNOSIS — Z7982 Long term (current) use of aspirin: Secondary | ICD-10-CM

## 2017-07-28 DIAGNOSIS — G4731 Primary central sleep apnea: Secondary | ICD-10-CM | POA: Diagnosis present

## 2017-07-28 DIAGNOSIS — I442 Atrioventricular block, complete: Secondary | ICD-10-CM

## 2017-07-28 DIAGNOSIS — J449 Chronic obstructive pulmonary disease, unspecified: Secondary | ICD-10-CM | POA: Diagnosis present

## 2017-07-28 DIAGNOSIS — Z79899 Other long term (current) drug therapy: Secondary | ICD-10-CM | POA: Diagnosis not present

## 2017-07-28 DIAGNOSIS — Z885 Allergy status to narcotic agent status: Secondary | ICD-10-CM

## 2017-07-28 DIAGNOSIS — I499 Cardiac arrhythmia, unspecified: Secondary | ICD-10-CM | POA: Diagnosis not present

## 2017-07-28 HISTORY — DX: Atrioventricular block, second degree: I44.1

## 2017-07-28 LAB — CBC WITH DIFFERENTIAL/PLATELET
Basophils Absolute: 0.1 10*3/uL (ref 0.0–0.1)
Basophils Relative: 1 %
EOS ABS: 0.4 10*3/uL (ref 0.0–0.7)
Eosinophils Relative: 4 %
HCT: 47.4 % (ref 39.0–52.0)
HEMOGLOBIN: 16.5 g/dL (ref 13.0–17.0)
LYMPHS ABS: 2.6 10*3/uL (ref 0.7–4.0)
LYMPHS PCT: 28 %
MCH: 32.5 pg (ref 26.0–34.0)
MCHC: 34.8 g/dL (ref 30.0–36.0)
MCV: 93.5 fL (ref 78.0–100.0)
MONOS PCT: 10 %
Monocytes Absolute: 0.9 10*3/uL (ref 0.1–1.0)
NEUTROS PCT: 57 %
Neutro Abs: 5.5 10*3/uL (ref 1.7–7.7)
Platelets: 200 10*3/uL (ref 150–400)
RBC: 5.07 MIL/uL (ref 4.22–5.81)
RDW: 13.3 % (ref 11.5–15.5)
WBC: 9.5 10*3/uL (ref 4.0–10.5)

## 2017-07-28 LAB — CBC
HCT: 48.3 % (ref 39.0–52.0)
HEMOGLOBIN: 16.9 g/dL (ref 13.0–17.0)
MCH: 32.7 pg (ref 26.0–34.0)
MCHC: 35 g/dL (ref 30.0–36.0)
MCV: 93.4 fL (ref 78.0–100.0)
Platelets: 206 10*3/uL (ref 150–400)
RBC: 5.17 MIL/uL (ref 4.22–5.81)
RDW: 13.5 % (ref 11.5–15.5)
WBC: 9.8 10*3/uL (ref 4.0–10.5)

## 2017-07-28 LAB — BASIC METABOLIC PANEL
Anion gap: 12 (ref 5–15)
BUN: 17 mg/dL (ref 6–20)
CHLORIDE: 106 mmol/L (ref 101–111)
CO2: 19 mmol/L — AB (ref 22–32)
CREATININE: 0.8 mg/dL (ref 0.61–1.24)
Calcium: 9.2 mg/dL (ref 8.9–10.3)
GFR calc Af Amer: >60 mL/min (ref >60)
GFR calc non Af Amer: >60 mL/min (ref >60)
GLUCOSE: 103 mg/dL — AB (ref 65–99)
POTASSIUM: 3.6 mmol/L (ref 3.5–5.1)
SODIUM: 137 mmol/L (ref 135–145)

## 2017-07-28 LAB — MRSA PCR SCREENING: MRSA by PCR: NEGATIVE

## 2017-07-28 MED ORDER — ASPIRIN EC 325 MG PO TBEC
325.0000 mg | DELAYED_RELEASE_TABLET | Freq: Two times a day (BID) | ORAL | Status: DC
  Administered 2017-07-29: 325 mg via ORAL
  Filled 2017-07-28 (×2): qty 1

## 2017-07-28 MED ORDER — AMLODIPINE BESYLATE 5 MG PO TABS
5.0000 mg | ORAL_TABLET | Freq: Every day | ORAL | Status: DC
  Administered 2017-07-28 – 2017-07-29 (×2): 5 mg via ORAL
  Filled 2017-07-28 (×3): qty 1

## 2017-07-28 MED ORDER — HYDRALAZINE HCL 50 MG PO TABS
50.0000 mg | ORAL_TABLET | Freq: Three times a day (TID) | ORAL | Status: DC
  Administered 2017-07-28 – 2017-07-30 (×5): 50 mg via ORAL
  Filled 2017-07-28 (×5): qty 1

## 2017-07-28 MED ORDER — HEPARIN SODIUM (PORCINE) 5000 UNIT/ML IJ SOLN
5000.0000 [IU] | Freq: Three times a day (TID) | INTRAMUSCULAR | Status: DC
  Administered 2017-07-28 – 2017-07-30 (×5): 5000 [IU] via SUBCUTANEOUS
  Filled 2017-07-28 (×5): qty 1

## 2017-07-28 MED ORDER — CLONAZEPAM 0.5 MG PO TABS: 0.5000 mg | ORAL_TABLET | Freq: Two times a day (BID) | ORAL | Status: DC | PRN

## 2017-07-28 MED ORDER — TRIAMTERENE-HCTZ 37.5-25 MG PO TABS
1.0000 | ORAL_TABLET | Freq: Every day | ORAL | Status: DC
  Administered 2017-07-28 – 2017-07-29 (×2): 1 via ORAL
  Filled 2017-07-28 (×4): qty 1

## 2017-07-28 MED ORDER — ATORVASTATIN CALCIUM 20 MG PO TABS
20.0000 mg | ORAL_TABLET | Freq: Every day | ORAL | Status: DC
  Administered 2017-07-28 – 2017-07-29 (×2): 20 mg via ORAL
  Filled 2017-07-28 (×3): qty 1

## 2017-07-28 NOTE — Progress Notes (Signed)
Cardiology Office Note Date:  07/28/2017   ID:  Douglas Christensen, DOB 1951/02/08, MRN [id number]  PCP:  Cassandria Anger, MD  Cardiologist:  Sherren Mocha, MD    Chief Complaint  Patient presents with  . PVD    follow up     History of Present Illness: Douglas Christensen is a 66 y.o. male who presents for follow-up evaluation.  The patient is been followed for coronary artery disease status post CABG in 2014. He also has extensive peripheral arterial disease and has undergone femoral endarterectomy and lower extremity stenting procedures. He recently was evaluated in the emergency room for uncontrolled hypertension with a systolic blood pressure greater than 200. Amlodipine had been decreased because of leg swelling. His atenolol was increased to 50 mg twice daily. He has been noted to have slow heart rates in the past per his report with heart rate in the 40s.  Patient feels well other than generalized fatigue. He says some days he feels "sluggish." He has not had lightheadedness, dizziness, or frank syncope. He denies chest pain or pressure. He does have mild shortness of breath with exertion with no recent change. Denies leg swelling, orthopnea, PND, or heart palpitations.  On arrival today the patient's heart rate is 40. An EKG is done and shows 2-1 heart block with a heart rate of 29.   Past Medical History:  Diagnosis Date  . Anxiety   . Arthritis   . CAD (coronary artery disease)    Mild plaque (cath "years ago"); abnormal Myoview 04/2013 with subsequent CABG x 5 with LIMA to LAD, SVG to OM1, SVG to DX, SVG to PD & PL.   Marland Kitchen COPD (chronic obstructive pulmonary disease) ([place])   . GERD (gastroesophageal reflux disease)   . History of colonic polyps   . Hx of echocardiogram    Echo (9/15):  EF 55-60%; Gr 2 DD, mild BAE  . Hyperlipidemia   . Hypertension   . LBP (low back pain)   . Meralgia paresthetica of left side 2011  . PVD (peripheral vascular disease) ([place])    Stent to left  common femoral and right superficial femoral.  2008.  50%  left renal   . Shortness of breath    "once in awhile; can happen at anytime" (08/26/2013)  . Sleep apnea    mod OSA, central sleep apnea/hypoapnea syndrome 11/22/12, CPAP every night   . Vitamin D deficiency     Past Surgical History:  Procedure Laterality Date  . ABDOMINAL AORTAGRAM N/A 11/16/2011   Procedure: ABDOMINAL Maxcine Ham;  Surgeon: Sherren Mocha, MD;  Location: [facility] CATH LAB;  Service: Cardiovascular;  Laterality: N/A;  . ABDOMINAL AORTAGRAM N/A 11/14/2012   Procedure: ABDOMINAL Maxcine Ham;  Surgeon: Sherren Mocha, MD;  Location: [facility] CATH LAB;  Service: Cardiovascular;  Laterality: N/A;  . ABDOMINAL AORTAGRAM N/A 12/30/2014   Procedure: ABDOMINAL Maxcine Ham;  Surgeon: Serafina Mitchell, MD;  Location: [facility] CATH LAB;  Service: Cardiovascular;  Laterality: N/A;  . CARDIAC CATHETERIZATION  05/02/13   x2   . CORONARY ARTERY BYPASS GRAFT N/A 05/06/2013   Procedure: CORONARY ARTERY BYPASS GRAFTING (CABG);  Surgeon: Melrose Nakayama, MD;  Location: [place];  Service: Open Heart Surgery;  Laterality: N/A;  Coronary artery bypass graft times five using left internal mammary artery and left greater saphenous vein via endovein harvest.  . ENDARTERECTOMY FEMORAL Left 01/29/2015   Procedure: ENDARTERECTOMY FEMORAL WITH PATCH ANGIOPLASTY;  Surgeon: Serafina Mitchell, MD;  Location: [place];  Service: Vascular;  Laterality: Left;  . EYE SURGERY  03/24/16   cataract surgery on left eye  . FEMORAL-POPLITEAL BYPASS GRAFT  12/27/2012   Procedure: BYPASS GRAFT FEMORAL-POPLITEAL ARTERY;  Surgeon: Serafina Mitchell, MD;  Location: [facility] OR;  Service: Vascular;  Laterality: Right;  using non-reversed sapphenous vein.  Marland Kitchen ILIAC ATHERECTOMY Left 01/29/2015   Procedure: SUPERFICIAL FEMORAL ARTERY ATHERECTOMY/PERCUTANEOUS TRANSLUMINAL ANGIOPLASTY; superficial femoral artery stent;  Surgeon: Serafina Mitchell, MD;  Location: [place];  Service: Vascular;  Laterality: Left;  .  LOWER EXTREMITY ANGIOGRAM Bilateral 11/16/2011   Procedure: LOWER EXTREMITY ANGIOGRAM;  Surgeon: Sherren Mocha, MD;  Location: [facility] CATH LAB;  Service: Cardiovascular;  Laterality: Bilateral;  . lower extremity stents     bilateral lower extremities x 2  . PERCUTANEOUS STENT INTERVENTION Left 11/16/2011   Procedure: PERCUTANEOUS STENT INTERVENTION;  Surgeon: Sherren Mocha, MD;  Location: [facility] CATH LAB;  Service: Cardiovascular;  Laterality: Left;  . TONSILLECTOMY    . TOTAL HIP ARTHROPLASTY Left 06/29/2015   Procedure: TOTAL HIP ARTHROPLASTY;  Surgeon: Frederik Pear, MD;  Location: [place];  Service: Orthopedics;  Laterality: Left;  LEFT TOTAL HIP ARTHROPLASTY DEPUY SROM/PINNACLE    Current Outpatient Prescriptions  Medication Sig Dispense Refill  . amLODipine (NORVASC) 5 MG tablet Take 1 tablet (5 mg total) by mouth daily. 90 tablet 3  . aspirin EC 325 MG tablet Take 1 tablet (325 mg total) by mouth 2 (two) times daily. 30 tablet 0  . atenolol (TENORMIN) 50 MG tablet Take 1 tablet (50 mg total) by mouth 2 (two) times daily. 180 tablet 3  . atorvastatin (LIPITOR) 20 MG tablet Take 1 tablet (20 mg total) by mouth daily. 90 tablet 3  . cholecalciferol (VITAMIN D) 1000 units tablet Take 2 tablets (2,000 Units total) by mouth daily. 100 tablet 3  . clonazePAM (KLONOPIN) 0.5 MG tablet Take 1 tablet (0.5 mg total) by mouth 2 (two) times daily as needed for anxiety. 60 tablet 1  . hydrALAZINE (APRESOLINE) 50 MG tablet TAKE 50 MG BY MOUTH THREE TIMES DAILY 270 tablet 3  . sildenafil (REVATIO) 20 MG tablet Take 20-100 mg by mouth daily as needed (ED).    . triamterene-hydrochlorothiazide (MAXZIDE-25) 37.5-25 MG tablet Take 1 tablet by mouth daily. 30 tablet 11  . vitamin B-12 (CYANOCOBALAMIN) 1000 MCG tablet Take 1,000 mcg by mouth daily.     Current Facility-Administered Medications  Medication Dose Route Frequency Provider Last Rate Last Dose  . ipratropium-albuterol (DUONEB) 0.5-2.5 (3) MG/3ML nebulizer  solution 3 mL  3 mL Nebulization Once Janith Lima, MD        Allergies:   Roxicodone [oxycodone hcl]; Benazepril; and Itraconazole   Social History:  The patient  reports that he quit smoking about 4 years ago. His smoking use included Cigarettes. He has a 94.00 pack-year smoking history. He has never used smokeless tobacco. He reports that he drinks about 2.4 oz of alcohol per week . He reports that he does not use drugs.   Family History:  The patient's family history includes Alzheimer's disease in his father; Cancer in his father and mother; Heart disease in his mother; Hyperlipidemia in his mother; Hypertension in his mother.    ROS:  Please see the history of present illness.  Otherwise, review of systems is positive for Exertional dyspnea.  All other systems are reviewed and negative.    PHYSICAL EXAM: VS:  BP 132/60   Pulse (!) 40   Ht 5\' 5"  (1.651  m)   Wt 192 lb (87.1 kg)   BMI 31.95 kg/m  , BMI Body mass index is 31.95 kg/m. GEN: Well nourished, well developed, in no acute distress  HEENT: normal  Neck: no JVD, no masses. Bilateral carotid bruits Cardiac: brady and regular without murmur or gallop       Respiratory:  clear to auscultation bilaterally, normal work of breathing GI: soft, nontender, nondistended, + BS MS: no deformity or atrophy  Ext: no pretibial edema Skin: warm and dry, no rash Neuro:  Strength and sensation are intact Psych: euthymic mood, full affect  EKG:  EKG is ordered today. The ekg ordered today shows 2-1 AV block with a heart rate of 29 bpm, incomplete right bundle branch block with QRS duration 100 ms  Recent Labs: 06/30/2017: ALT 38; BUN 19; Creatinine, Ser 0.98; Hemoglobin 15.6; Platelets 215.0; Potassium 3.8; Sodium 139; TSH 3.33   Lipid Panel     Component Value Date/Time   CHOL 181 03/06/2017 1233   TRIG 119.0 03/06/2017 1233   TRIG 118 09/25/2006 1002   HDL 50.00 03/06/2017 1233   CHOLHDL 4 03/06/2017 1233   VLDL 23.8  03/06/2017 1233   LDLCALC 107 (H) 03/06/2017 1233   LDLDIRECT 138.6 11/05/2014 0811      Wt Readings from Last 3 Encounters:  07/28/17 192 lb (87.1 kg)  07/04/17 189 lb (85.7 kg)  06/30/17 195 lb (88.5 kg)    ASSESSMENT AND PLAN: 1.  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 Remarkably, he is completely asymptomatic. His beta blocker was recently increased to better control blood pressure. At baseline he has first-degree AV block with PR interval greater than 300 ms. The patient will be transported to the hospital by EMS non-emergently for admission and telemetry monitoring. We will hold his atenolol and observe his heart rate closely. An EP consultation will be obtained for consideration of a permanent pacemaker pending his response to holding his beta blocker. Will obtain a 2-D echocardiogram. Discussed plan at length with the patient who is agreeable.  2. Coronary artery disease, native vessel: The patient is status post bypass surgery. He has no symptoms of angina. His current medicines will be continued with the exception of atenolol which will be held secondary to #1.  3. Peripheral arterial disease: Followed by vascular surgery.  4. Hypertension: Blood pressure is stable today. May need to adjust his antihypertensive medication since his beta blocker will be discontinued. He is also treated with hydralazine, Maxide, and amlodipine. He has not tolerated amlodipine 10 mg dose because of leg swelling.  Disposition: EMS transport to [facility] for telemetry monitoring, beta blocker washout, and EP consultation for consideration of permanent pacemaker. The patient is hemodynamically stable and asymptomatic at present despite his 2-1 heart block and marked bradycardia.  Current medicines are reviewed with the patient today.  The patient does not have concerns regarding medicines.  Labs/ tests ordered today include:  No orders of the defined types were placed in this  encounter.   Disposition:   FU pending hospital course  Signed, Sherren Mocha, MD  07/28/2017 9:08 AM    [facility] [address] [place] [address], [place], [place]  [address] Phone: [phone]; Fax: [phone]

## 2017-07-28 NOTE — ED Triage Notes (Signed)
Pt reports being at Dr. Burt Knack (cardiology) office today for yearly check. HR was found to be in the 30's. Pt was sent for further eval. Denies symptoms. No pain. Pt states that he had his atenolol increased about 2 months ago. Pt has had multiple heart bypass surgeries.
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
## 2017-07-29 ENCOUNTER — Encounter (HOSPITAL_COMMUNITY): Payer: Self-pay | Admitting: Internal Medicine

## 2017-07-29 DIAGNOSIS — R001 Bradycardia, unspecified: Secondary | ICD-10-CM | POA: Diagnosis present

## 2017-07-29 DIAGNOSIS — Z885 Allergy status to narcotic agent status: Secondary | ICD-10-CM | POA: Diagnosis not present

## 2017-07-29 DIAGNOSIS — E559 Vitamin D deficiency, unspecified: Secondary | ICD-10-CM | POA: Diagnosis present

## 2017-07-29 DIAGNOSIS — Z951 Presence of aortocoronary bypass graft: Secondary | ICD-10-CM | POA: Diagnosis not present

## 2017-07-29 DIAGNOSIS — Z888 Allergy status to other drugs, medicaments and biological substances status: Secondary | ICD-10-CM | POA: Diagnosis not present

## 2017-07-29 DIAGNOSIS — Z8349 Family history of other endocrine, nutritional and metabolic diseases: Secondary | ICD-10-CM | POA: Diagnosis not present

## 2017-07-29 DIAGNOSIS — J449 Chronic obstructive pulmonary disease, unspecified: Secondary | ICD-10-CM | POA: Diagnosis present

## 2017-07-29 DIAGNOSIS — I441 Atrioventricular block, second degree: Principal | ICD-10-CM

## 2017-07-29 DIAGNOSIS — Z86010 Personal history of colonic polyps: Secondary | ICD-10-CM | POA: Diagnosis not present

## 2017-07-29 DIAGNOSIS — I2510 Atherosclerotic heart disease of native coronary artery without angina pectoris: Secondary | ICD-10-CM | POA: Diagnosis present

## 2017-07-29 DIAGNOSIS — I119 Hypertensive heart disease without heart failure: Secondary | ICD-10-CM

## 2017-07-29 DIAGNOSIS — G4731 Primary central sleep apnea: Secondary | ICD-10-CM | POA: Diagnosis present

## 2017-07-29 DIAGNOSIS — Z87891 Personal history of nicotine dependence: Secondary | ICD-10-CM | POA: Diagnosis not present

## 2017-07-29 DIAGNOSIS — F419 Anxiety disorder, unspecified: Secondary | ICD-10-CM | POA: Diagnosis present

## 2017-07-29 DIAGNOSIS — K219 Gastro-esophageal reflux disease without esophagitis: Secondary | ICD-10-CM | POA: Diagnosis present

## 2017-07-29 DIAGNOSIS — Z96642 Presence of left artificial hip joint: Secondary | ICD-10-CM | POA: Diagnosis present

## 2017-07-29 DIAGNOSIS — Z8249 Family history of ischemic heart disease and other diseases of the circulatory system: Secondary | ICD-10-CM | POA: Diagnosis not present

## 2017-07-29 DIAGNOSIS — E785 Hyperlipidemia, unspecified: Secondary | ICD-10-CM | POA: Diagnosis present

## 2017-07-29 DIAGNOSIS — Z79899 Other long term (current) drug therapy: Secondary | ICD-10-CM | POA: Diagnosis not present

## 2017-07-29 DIAGNOSIS — G4733 Obstructive sleep apnea (adult) (pediatric): Secondary | ICD-10-CM | POA: Diagnosis present

## 2017-07-29 DIAGNOSIS — M1990 Unspecified osteoarthritis, unspecified site: Secondary | ICD-10-CM | POA: Diagnosis present

## 2017-07-29 DIAGNOSIS — I739 Peripheral vascular disease, unspecified: Secondary | ICD-10-CM | POA: Diagnosis present

## 2017-07-29 DIAGNOSIS — Z7982 Long term (current) use of aspirin: Secondary | ICD-10-CM | POA: Diagnosis not present

## 2017-07-29 LAB — BASIC METABOLIC PANEL
ANION GAP: 9 (ref 5–15)
BUN: 16 mg/dL (ref 6–20)
CO2: 25 mmol/L (ref 22–32)
Calcium: 9.3 mg/dL (ref 8.9–10.3)
Chloride: 104 mmol/L (ref 101–111)
Creatinine, Ser: 0.81 mg/dL (ref 0.61–1.24)
GFR calc Af Amer: >60 mL/min (ref >60)
GLUCOSE: 100 mg/dL — AB (ref 65–99)
POTASSIUM: 3.6 mmol/L (ref 3.5–5.1)
Sodium: 138 mmol/L (ref 135–145)

## 2017-07-29 MED ORDER — ASPIRIN EC 325 MG PO TBEC
325.0000 mg | DELAYED_RELEASE_TABLET | Freq: Every day | ORAL | Status: DC
  Filled 2017-07-29: qty 1

## 2017-07-29 NOTE — Consult Note (Addendum)
ELECTROPHYSIOLOGY CONSULT NOTE    Primary Care Physician: Cassandria Anger, MD Referring Physician:  Dr Burt Knack  Admit Date: 07/28/2017  Reason for consultation: AV block  Douglas Christensen is a 66 y.o. male with a h/o CAD, HTN, and OSA.  Ep is consulted for AV block. He has a longstanding first degree AV block.  Recently, his PCP increased atenolol due to elevated BP.  He has done reasonably well, without only rare fatigue.  He presented for routine follow-up with Dr Burt Knack yesterday and was noted to have 2:1 AV block with ventricular rates in the 4s.  He was unaware.  He was admitted for closer observation and atenolol was discontinued.  His last dose of atenolol was yesterday morning.  Today, he denies symptoms of palpitations, chest pain, shortness of breath, orthopnea, PND, lower extremity edema, dizziness, presyncope, syncope, or neurologic sequela. The patient is tolerating medications without difficulties and is otherwise without complaint today.   Past Medical History:  Diagnosis Date  . Anxiety   . Arthritis   . CAD (coronary artery disease)    Mild plaque (cath "years ago"); abnormal Myoview 04/2013 with subsequent CABG x 5 with LIMA to LAD, SVG to OM1, SVG to DX, SVG to PD & PL.   Marland Kitchen COPD (chronic obstructive pulmonary disease) ([place])   . GERD (gastroesophageal reflux disease)   . History of colonic polyps   . Hx of echocardiogram    Echo (9/15):  EF 55-60%; Gr 2 DD, mild BAE  . Hyperlipidemia   . Hypertension   . LBP (low back pain)   . Meralgia paresthetica of left side 2011  . PVD (peripheral vascular disease) ([place])    Stent to left common femoral and right superficial femoral.  2008.  50%  left renal   . Second degree AV block, Mobitz type I   . Shortness of breath    "once in awhile; can happen at anytime" (08/26/2013)  . Sleep apnea    mod OSA, central sleep apnea/hypoapnea syndrome 11/22/12, CPAP every night   . Vitamin D deficiency    Past Surgical History:    Procedure Laterality Date  . ABDOMINAL AORTAGRAM N/A 11/16/2011   Procedure: ABDOMINAL Maxcine Ham;  Surgeon: Sherren Mocha, MD;  Location: [facility] CATH LAB;  Service: Cardiovascular;  Laterality: N/A;  . ABDOMINAL AORTAGRAM N/A 11/14/2012   Procedure: ABDOMINAL Maxcine Ham;  Surgeon: Sherren Mocha, MD;  Location: [facility] CATH LAB;  Service: Cardiovascular;  Laterality: N/A;  . ABDOMINAL AORTAGRAM N/A 12/30/2014   Procedure: ABDOMINAL Maxcine Ham;  Surgeon: Serafina Mitchell, MD;  Location: [facility] CATH LAB;  Service: Cardiovascular;  Laterality: N/A;  . CARDIAC CATHETERIZATION  05/02/13   x2   . CORONARY ARTERY BYPASS GRAFT N/A 05/06/2013   Procedure: CORONARY ARTERY BYPASS GRAFTING (CABG);  Surgeon: Melrose Nakayama, MD;  Location: [place];  Service: Open Heart Surgery;  Laterality: N/A;  Coronary artery bypass graft times five using left internal mammary artery and left greater saphenous vein via endovein harvest.  . ENDARTERECTOMY FEMORAL Left 01/29/2015   Procedure: ENDARTERECTOMY FEMORAL WITH PATCH ANGIOPLASTY;  Surgeon: Serafina Mitchell, MD;  Location: [place];  Service: Vascular;  Laterality: Left;  . EYE SURGERY  03/24/16   cataract surgery on left eye  . FEMORAL-POPLITEAL BYPASS GRAFT  12/27/2012   Procedure: BYPASS GRAFT FEMORAL-POPLITEAL ARTERY;  Surgeon: Serafina Mitchell, MD;  Location[facility] OR;  Service: Vascular;  Laterality: Right;  using non-reversed sapphenous vein.  Marland Kitchen ILIAC ATHERECTOMY Left 01/29/2015  Procedure: SUPERFICIAL FEMORAL ARTERY ATHERECTOMY/PERCUTANEOUS TRANSLUMINAL ANGIOPLASTY; superficial femoral artery stent;  Surgeon: Serafina Mitchell, MD;  Location: [place];  Service: Vascular;  Laterality: Left;  . LOWER EXTREMITY ANGIOGRAM Bilateral 11/16/2011   Procedure: LOWER EXTREMITY ANGIOGRAM;  Surgeon: Sherren Mocha, MD;  Location: [facility] CATH LAB;  Service: Cardiovascular;  Laterality: Bilateral;  . lower extremity stents     bilateral lower extremities x 2  . PERCUTANEOUS STENT INTERVENTION Left  11/16/2011   Procedure: PERCUTANEOUS STENT INTERVENTION;  Surgeon: Sherren Mocha, MD;  Location: [facility] CATH LAB;  Service: Cardiovascular;  Laterality: Left;  . TONSILLECTOMY    . TOTAL HIP ARTHROPLASTY Left 06/29/2015   Procedure: TOTAL HIP ARTHROPLASTY;  Surgeon: Frederik Pear, MD;  Location: [place];  Service: Orthopedics;  Laterality: Left;  LEFT TOTAL HIP ARTHROPLASTY DEPUY SROM/PINNACLE    . amLODipine  5 mg Oral Daily  . aspirin EC  325 mg Oral BID  . atorvastatin  20 mg Oral Daily  . heparin  5,000 Units Subcutaneous Q8H  . hydrALAZINE  50 mg Oral Q8H  . triamterene-hydrochlorothiazide  1 tablet Oral Daily     Allergies  Allergen Reactions  . Roxicodone [Oxycodone Hcl] Other (See Comments)    hallucinations  . Benazepril Cough  . Itraconazole Nausea Only and Rash    Social History   Social History  . Marital status: Widowed    Spouse name: N/A  . Number of children: 2  . Years of education: N/A   Occupational History  . [employer]   Social History Main Topics  . Smoking status: Former Smoker    Packs/day: 2.00    Years: 47.00    Types: Cigarettes    Quit date: 12/17/2012  . Smokeless tobacco: Never Used  . Alcohol use 2.4 oz/week    4 Shots of liquor per week     Comment: 08/26/2013 "3-4 mixed drinks/wk"  . Drug use: No  . Sexual activity: Yes   Other Topics Concern  . Not on file   Social History Narrative   Widower    Family History  Problem Relation Age of Onset  . Heart disease Mother        No clear CAD and Heart Disease before age 13  . Hypertension Mother   . Cancer Mother        ? colon ca  . Hyperlipidemia Mother   . Cancer Father        brain tumor  . Alzheimer's disease Father   . Colon cancer Neg Hx   . Heart attack Neg Hx     ROS- All systems are reviewed and negative except as per the HPI above  Physical Exam: Telemetry: Vitals:   07/28/17 2325 07/29/17 0403 07/29/17 0740 07/29/17 0900  BP: (!) 139/55 (!) 124/49  104/87 (!) 157/75  Pulse: (!) 52 (!) 41 (!) 52 (!) 57  Resp: 15 18 (!) 22 16  Temp: 97.6 F (36.4 C) 98 F (36.7 C) 98.5 F (36.9 C) 98.3 F (36.8 C)  TempSrc: Axillary Oral Oral Oral  SpO2: 94% 91% 94% 96%  Weight:  194 lb 10.7 oz (88.3 kg)    Height:        GEN- The patient is overweight appearing, alert and oriented x 3 today.   Head- normocephalic, atraumatic Eyes-  Sclera clear, conjunctiva pink Ears- hearing intact Oropharynx- clear Neck- supple,  Lungs- Clear to ausculation bilaterally, normal work of breathing Heart- Regular rate and rhythm  GI- soft, NT,  ND, + BS Extremities- no clubbing, cyanosis, or edema MS- no significant deformity or atrophy Skin- no rash or lesion Psych- euthymic mood, full affect Neuro- strength and sensation are intact  EKG reveas sinus rhythm with 2:1 AV block  Labs:   Lab Results  Component Value Date   WBC 9.8 07/28/2017   HGB 16.9 07/28/2017   HCT 48.3 07/28/2017   MCV 93.4 07/28/2017   PLT 206 07/28/2017    Recent Labs Lab 07/29/17 0350  NA 138  K 3.6  CL 104  CO2 25  BUN 16  CREATININE 0.81  CALCIUM 9.3  GLUCOSE 100*   Lab Results  Component Value Date   CKTOTAL 58 10/06/2011   CKMB 2.6 10/06/2011   TROPONINI <0.30 08/27/2013    Lab Results  Component Value Date   CHOL 181 03/06/2017   CHOL 185 03/01/2016   CHOL 207 (H) 11/05/2014   Lab Results  Component Value Date   HDL 50.00 03/06/2017   HDL 38.90 (L) 03/01/2016   HDL 44.00 11/05/2014   Lab Results  Component Value Date   LDLCALC 107 (H) 03/06/2017   LDLCALC 117 (H) 03/01/2016   LDLCALC 75 03/13/2014   Lab Results  Component Value Date   TRIG 119.0 03/06/2017   TRIG 148.0 03/01/2016   TRIG 224.0 (H) 11/05/2014   Lab Results  Component Value Date   CHOLHDL 4 03/06/2017   CHOLHDL 5 03/01/2016   CHOLHDL 5 11/05/2014   Lab Results  Component Value Date   LDLDIRECT 138.6 11/05/2014   LDLDIRECT 159.6 08/28/2012   LDLDIRECT 103.6  06/06/2011     Echo: reviewed  ASSESSMENT AND PLAN:   1. Mobitz I second degree AV block The patient presents with minimally symptomatic 2:1 AV block.  His atenolol was recently increased and this is likely the cause.  Overnight, he had mobitz I second degree AV block and currently has 1:1 AV conduction with first degree AV block.  He feels "fine".  I anticipate that we can avoid pacing at this time, but expect that he may eventually require PPM. Continue telemetry.  If conduction remains in tact, will assess with ambulation.  2. Hypertensive  Cardiovascular disease bp is elevated at times Stop atenolol Consider increasing norvasc if needed for BP management  3. CAD/PVD No ischemic symptoms No changes today He is chronically on ASA 325mg  BID for reasons that are not clear to me.  I will defer to his primary cardiologist and vascular surgical teams.  Transfer to telemetry Hopefully home tomorrow if heart rates remain stable with ambulation  Thompson Grayer, MD 07/29/2017  10:35 AM

## 2017-07-29 NOTE — Progress Notes (Signed)
Called report spoke to [place], [place], on 3W, will transfer patient per wheelchair. No oxygen, IV is saline locked.

## 2017-07-29 NOTE — Progress Notes (Signed)
Attempted to call 3W, unit pt is assigned to transfer to, two times. Nurse unavailable to receive report.

## 2017-07-29 NOTE — Progress Notes (Signed)
Patient HR has dropped as low as 27 non-sustaining. Patient still asymptomatic. Cardiology texted page. Will continue to monitor.

## 2017-07-30 DIAGNOSIS — R001 Bradycardia, unspecified: Secondary | ICD-10-CM

## 2017-07-30 NOTE — Progress Notes (Signed)
Ambulated today as per MD instruction. HR had gone up to 85 while ambulating to 600 feet. No c/o of SOB .

## 2017-07-30 NOTE — Progress Notes (Signed)
Progress Note  Patient Name: Douglas Christensen Date of Encounter: 07/30/2017  Primary Cardiologist: Dr Burt Knack  Subjective   Doing well today, the patient denies CP or SOB.  No new concerns  Inpatient Medications    Scheduled Meds: . amLODipine  5 mg Oral Daily  . aspirin EC  325 mg Oral Daily  . atorvastatin  20 mg Oral Daily  . heparin  5,000 Units Subcutaneous Q8H  . hydrALAZINE  50 mg Oral Q8H  . triamterene-hydrochlorothiazide  1 tablet Oral Daily   Continuous Infusions:  PRN Meds: clonazePAM   Vital Signs    Vitals:   07/29/17 2300 07/30/17 0317 07/30/17 0444 07/30/17 0752  BP: (!) 143/43 (!) 149/68    Pulse: (!) 38 (!) 53  63  Resp: 18 18  16   Temp: 98.3 F (36.8 C) 98.4 F (36.9 C)    TempSrc: Axillary Axillary    SpO2: 96% 98%    Weight:   185 lb 14.4 oz (84.3 kg)   Height:        Intake/Output Summary (Last 24 hours) at 07/30/17 0916 Last data filed at 07/30/17 0600  Gross per 24 hour  Intake              860 ml  Output                0 ml  Net              860 ml   Filed Weights   07/28/17 1845 07/29/17 0403 07/30/17 0444  Weight: 192 lb 14.4 oz (87.5 kg) 194 lb 10.7 oz (88.3 kg) 185 lb 14.4 oz (84.3 kg)    Telemetry    Sinus rhythm 50s-70s,  Rare mobitz I second degree AV block,  Rare PVCs - Personally Reviewed  Physical Exam  GEN- The patient is well appearing, alert and oriented x 3 today.   Head- normocephalic, atraumatic Eyes-  Sclera clear, conjunctiva pink Ears- hearing intact Oropharynx- clear Neck- supple, Lungs- Clear to ausculation bilaterally, normal work of breathing Heart- Regular rate and rhythm  GI- soft, NT, ND, + BS Extremities- no clubbing, cyanosis, or edema  MS- no significant deformity or atrophy Skin- no rash or lesion Psych- euthymic mood, full affect Neuro- strength and sensation are intact   Labs    Chemistry Recent Labs Lab 07/28/17 1016 07/29/17 0350  NA 137 138  K 3.6 3.6  CL 106 104  CO2 19* 25    GLUCOSE 103* 100*  BUN 17 16  CREATININE 0.80 0.81  CALCIUM 9.2 9.3  GFRNONAA >60 >60  GFRAA >60 >60  ANIONGAP 12 9     Hematology Recent Labs Lab 07/28/17 1016 07/28/17 2102  WBC 9.5 9.8  RBC 5.07 5.17  HGB 16.5 16.9  HCT 47.4 48.3  MCV 93.5 93.4  MCH 32.5 32.7  MCHC 34.8 35.0  RDW 13.3 13.5  PLT 200 206    Cardiac EnzymesNo results for input(s): TROPONINI in the last 168 hours. No results for input(s): TROPIPOC in the last 168 hours.     Patient Profile     66 y.o. male admitted with second degree AV block, now improved  Assessment & Plan    1. Mobitz I second degree AV block Asymptomatic Heart rates are improved off of atenolol He wishes to avoid PPM at this time He will contact us immediately if he has any symptoms of bradycardia, dizziness, presyncope or syncope  2. Hypertensive cardiovascular disease Stable  No change required today  3. CAD/PVD Continue current ASA dosing.  I will defer to primary cardiologist/ vascular team regarding this  DC to home. Follow-up with EP NP in 2-4 weeks (I have sent message to schedulers) If rhythm is stable at that time, he will follow with Dr Burt Knack going forward.  Thompson Grayer MD, [facility] 07/30/2017 9:16 AM

## 2017-07-30 NOTE — Discharge Summary (Signed)
Discharge Summary    Patient ID: Douglas Christensen,  MRN: [id number], DOB/AGE: [birth year] 66 y.o.  Admit date: 07/28/2017 Discharge date: 07/30/2017  Primary Care Provider: Cassandria Anger Primary Cardiologist: Burt Knack Electrophysiologist: Pearce Littlefield  Discharge Diagnoses    Active Problems:   Bradycardia   Allergies Allergies  Allergen Reactions  . Roxicodone [Oxycodone Hcl] Other (See Comments)    hallucinations  . Benazepril Cough  . Itraconazole Nausea Only and Rash  . Plavix [Clopidogrel Bisulfate] Rash    Diagnostic Studies/Procedures   None _____________   History of Present Illness    Douglas Christensen is a 66 year old male patient with history of coronary artery disease hypertension and obstructive sleep apnea. The patient has long-standing first-degree AV block. He was seen by Dr. Burt Knack in our office for PVD evaluation, was found to have 2-1 AV block with ventricular rates in the 3s. The patient was asymptomatic. He was admitted to the hospital for further evaluation. He was noted that his primary care physician increased his atenolol due to elevated blood pressure prior to being seen that week.   Hospital Course     Consultants: Dr. Rayann Heman  On admission, the patient's heart rates were bradycardic, but after holding atenolol blood pressure had improved. He continues to have bradycardia heart rate 63 bpm. EKG on discharge revealed first-degree AV block heart rate of 60 bpm. Discussion for pacemaker was had with the patient by Dr. Rayann Heman and he currently refuses. He was seen and examined by Dr. Rayann Heman on discharge and found to be stable without any complaints of chest pain or shortness of breath.   He was continued on amlodipine, atorvastatin, hydralazine, and triamterene HCTZ on discharge. He will follow-up with Dr. Rayann Heman as an outpatient, message was RE sent to his practice. He will also follow up with Dr. Burt Knack on discharge. No further changes in medication regimen other  than discontinuation of atenolol were made. No diagnostic testing was completed. _____________  Discharge Vitals Blood pressure (!) 149/68, pulse 63, temperature 98.4 F (36.9 C), temperature source Axillary, resp. rate 16, height 5\' 5"  (1.651 m), weight 185 lb 14.4 oz (84.3 kg), SpO2 98 %.  Filed Weights   07/28/17 1845 07/29/17 0403 07/30/17 0444  Weight: 192 lb 14.4 oz (87.5 kg) 194 lb 10.7 oz (88.3 kg) 185 lb 14.4 oz (84.3 kg)    Labs & Radiologic Studies     CBC  Recent Labs  07/28/17 1016 07/28/17 2102  WBC 9.5 9.8  NEUTROABS 5.5  --   HGB 16.5 16.9  HCT 47.4 48.3  MCV 93.5 93.4  PLT 200 026   Basic Metabolic Panel  Recent Labs  07/28/17 1016 07/29/17 0350  NA 137 138  K 3.6 3.6  CL 106 104  CO2 19* 25  GLUCOSE 103* 100*  BUN 17 16  CREATININE 0.80 0.81  CALCIUM 9.2 9.3    Disposition   Pt is being discharged home today in good condition.  Follow-up Plans & Appointments    Follow-up Information    Thompson Grayer, MD Follow up.   Specialty:  Cardiology Why:  Dr. Jackalyn Lombard office will call you for appointment Contact information: [place] [address] [phone]        Sherren Mocha, MD Follow up.   Specialty:  Cardiology Why:  Dr. Antionette Char office will call you for appointment.  Contact information: [address] [place] [address] [phone]  Discharge Instructions    Call MD for:  difficulty breathing, headache or visual disturbances    Complete by:  As directed    Call MD for:  persistant dizziness or light-headedness    Complete by:  As directed    Call MD for:  persistant nausea and vomiting    Complete by:  As directed    Call MD for:  redness, tenderness, or signs of infection (pain, swelling, redness, odor or green/yellow discharge around incision site)    Complete by:  As directed    Call MD for:  severe uncontrolled pain    Complete by:  As directed    Call MD for:   temperature >100.4    Complete by:  As directed    Diet - low sodium heart healthy    Complete by:  As directed    Increase activity slowly    Complete by:  As directed       Discharge Medications   Current Discharge Medication List    CONTINUE these medications which have NOT CHANGED   Details  amLODipine (NORVASC) 5 MG tablet Take 1 tablet (5 mg total) by mouth daily. Qty: 90 tablet, Refills: 3    aspirin EC 325 MG tablet Take 1 tablet (325 mg total) by mouth 2 (two) times daily. Qty: 30 tablet, Refills: 0    atorvastatin (LIPITOR) 20 MG tablet Take 1 tablet (20 mg total) by mouth daily. Qty: 90 tablet, Refills: 3    cholecalciferol (VITAMIN D) 1000 units tablet Take 2 tablets (2,000 Units total) by mouth daily. Qty: 100 tablet, Refills: 3    clonazePAM (KLONOPIN) 0.5 MG tablet Take 1 tablet (0.5 mg total) by mouth 2 (two) times daily as needed for anxiety. Qty: 60 tablet, Refills: 1    cycloSPORINE (RESTASIS) 0.05 % ophthalmic emulsion Place 1 drop into both eyes 2 (two) times daily.    hydrALAZINE (APRESOLINE) 50 MG tablet TAKE 50 MG BY MOUTH THREE TIMES DAILY Qty: 270 tablet, Refills: 3    OVER THE COUNTER MEDICATION Take 1 capsule by mouth daily. "Nugenix" Natural Testosterone Booster    triamterene-hydrochlorothiazide (MAXZIDE-25) 37.5-25 MG tablet Take 1 tablet by mouth daily. Qty: 30 tablet, Refills: 11    vitamin B-12 (CYANOCOBALAMIN) 1000 MCG tablet Take 1,000 mcg by mouth daily.    sildenafil (REVATIO) 20 MG tablet Take 20-100 mg by mouth daily as needed (ED).      STOP taking these medications     atenolol (TENORMIN) 50 MG tablet          Outstanding Labs/Studies     Duration of Discharge Encounter   Greater than 30 minutes including physician time.  Signed, Phill Myron. West Pugh, ANP, AACC   07/30/2017, 10:46 AM  Thompson Grayer MD, [facility] 07/30/2017 1:28 PM

## 2017-08-01 ENCOUNTER — Telehealth: Payer: Self-pay | Admitting: Cardiovascular Disease

## 2017-08-01 NOTE — Telephone Encounter (Signed)
Patient called to report elevated HR around 100 since stopping atenolol (the highest reading has been 105).  He also reports his BP was 177/70 this AM (prior to taking medications). He rechecked his BP and HR a couple hours ago and his BP was 154/87 and HR 98. He c/o feeling tired when his HR is this high. He "feels good" when his HR is between 70-80.  He has no other symptoms and denies CP and SOB. He also denies racing heart and palpitations.  He states that his HR feels regular. He states he would like to get on the treadmill. Instructed patient to take it easy until his HR is better controlled. He has an appointment with EP next week s/p recent ED visit due to low heart rate. He states he wants Dr. Burt Knack to know that if he fees like a pacemaker would make him feel better, he is on board.  He will continue to check his VS but wait 1-2 hours after taking morning medications to better assess BP. He understands he will be called with further recommendations.

## 2017-08-01 NOTE — Telephone Encounter (Signed)
Pt c/o BP issue: STAT if pt c/o blurred vision, one-sided weakness or slurred speech  1. What are your last 5 BP readings? 177/75  Hr  105  2. Are you having any other symptoms (ex. Dizziness, headache, blurred vision, passed out)? No to the list of symptoms, pt verbalized that he just dont feel good 3. What is your BP issue? high

## 2017-08-02 ENCOUNTER — Ambulatory Visit (INDEPENDENT_AMBULATORY_CARE_PROVIDER_SITE_OTHER): Payer: PPO | Admitting: Internal Medicine

## 2017-08-02 ENCOUNTER — Other Ambulatory Visit (INDEPENDENT_AMBULATORY_CARE_PROVIDER_SITE_OTHER): Payer: PPO

## 2017-08-02 ENCOUNTER — Encounter: Payer: Self-pay | Admitting: Internal Medicine

## 2017-08-02 VITALS — BP 172/80 | HR 83 | Temp 98.4°F | Ht 65.0 in | Wt 188.0 lb

## 2017-08-02 DIAGNOSIS — I119 Hypertensive heart disease without heart failure: Secondary | ICD-10-CM | POA: Diagnosis not present

## 2017-08-02 DIAGNOSIS — R001 Bradycardia, unspecified: Secondary | ICD-10-CM | POA: Diagnosis not present

## 2017-08-02 DIAGNOSIS — D751 Secondary polycythemia: Secondary | ICD-10-CM

## 2017-08-02 LAB — CBC
HEMATOCRIT: 49.8 % (ref 39.0–52.0)
HEMOGLOBIN: 17.2 g/dL — AB (ref 13.0–17.0)
MCHC: 34.5 g/dL (ref 30.0–36.0)
MCV: 95.8 fl (ref 78.0–100.0)
Platelets: 238 10*3/uL (ref 150.0–400.0)
RBC: 5.2 Mil/uL (ref 4.22–5.81)
RDW: 13.8 % (ref 11.5–15.5)
WBC: 9.2 10*3/uL (ref 4.0–10.5)

## 2017-08-02 NOTE — Progress Notes (Signed)
Subjective:  Patient ID: Douglas Christensen, male    DOB: [birth year]  Age: 66 y.o. MRN: [id number]  CC: No chief complaint on file.   HPI ELVAN EBRON presents for for a post-hospital f/u for severe HTN, bradycardia w/heart block, CAD f/u. Pacemaker is a possibility. Off Atenolol...   Hx per d/c summary 07/30/17:  "On admission, the patient's heart rates were bradycardic, but after holding atenolol blood pressure had improved. He continues to have bradycardia heart rate 63 bpm. EKG on discharge revealed first-degree AV block heart rate of 60 bpm. Discussion for pacemaker was had with the patient by Dr. Rayann Heman and he currently refuses. He was seen and examined by Dr. Rayann Heman on discharge and found to be stable without any complaints of chest pain or shortness of breath.   He was continued on amlodipine, atorvastatin, hydralazine, and triamterene HCTZ on discharge. He will follow-up with Dr. Rayann Heman as an outpatient, message was RE sent to his practice. He will also follow up with Dr. Burt Knack on discharge. No further changes in medication regimen other than discontinuation of atenolol were made. No diagnostic testing was completed."  Outpatient Medications Prior to Visit  Medication Sig Dispense Refill  . amLODipine (NORVASC) 5 MG tablet Take 1 tablet (5 mg total) by mouth daily. 90 tablet 3  . aspirin EC 325 MG tablet Take 1 tablet (325 mg total) by mouth 2 (two) times daily. 30 tablet 0  . atorvastatin (LIPITOR) 20 MG tablet Take 1 tablet (20 mg total) by mouth daily. 90 tablet 3  . cholecalciferol (VITAMIN D) 1000 units tablet Take 2 tablets (2,000 Units total) by mouth daily. (Patient taking differently: Take 1,000 Units by mouth daily. ) 100 tablet 3  . clonazePAM (KLONOPIN) 0.5 MG tablet Take 1 tablet (0.5 mg total) by mouth 2 (two) times daily as needed for anxiety. 60 tablet 1  . cycloSPORINE (RESTASIS) 0.05 % ophthalmic emulsion Place 1 drop into both eyes 2 (two) times daily.    . hydrALAZINE  (APRESOLINE) 50 MG tablet TAKE 50 MG BY MOUTH THREE TIMES DAILY 270 tablet 3  . OVER THE COUNTER MEDICATION Take 1 capsule by mouth daily. "Nugenix" Natural Testosterone Booster    . sildenafil (REVATIO) 20 MG tablet Take 20-100 mg by mouth daily as needed (ED).    . triamterene-hydrochlorothiazide (MAXZIDE-25) 37.5-25 MG tablet Take 1 tablet by mouth daily. 30 tablet 11  . vitamin B-12 (CYANOCOBALAMIN) 1000 MCG tablet Take 1,000 mcg by mouth daily.     Facility-Administered Medications Prior to Visit  Medication Dose Route Frequency Provider Last Rate Last Dose  . ipratropium-albuterol (DUONEB) 0.5-2.5 (3) MG/3ML nebulizer solution 3 mL  3 mL Nebulization Once Janith Lima, MD        ROS Review of Systems  Constitutional: Positive for fatigue. Negative for appetite change, fever and unexpected weight change.  HENT: Negative for congestion, nosebleeds, sneezing, sore throat and trouble swallowing.   Eyes: Negative for itching and visual disturbance.  Respiratory: Negative for cough.   Cardiovascular: Negative for chest pain, palpitations and leg swelling.  Gastrointestinal: Negative for abdominal distention, blood in stool, diarrhea and nausea.  Genitourinary: Negative for frequency and hematuria.  Musculoskeletal: Negative for back pain, gait problem, joint swelling and neck pain.  Skin: Negative for rash.  Neurological: Negative for dizziness, tremors, speech difficulty and weakness.  Psychiatric/Behavioral: Negative for agitation, dysphoric mood and sleep disturbance. The patient is not nervous/anxious.     Objective:  BP (!) 172/80 (  BP Location: Left Arm, Patient Position: Sitting, Cuff Size: Large)   Pulse 83   Temp 98.4 F (36.9 C) (Oral)   Ht 5\' 5"  (1.651 m)   Wt 188 lb (85.3 kg)   SpO2 97%   BMI 31.28 kg/m   BP Readings from Last 3 Encounters:  08/02/17 (!) 172/80  07/30/17 (!) 149/68  07/28/17 132/60    Wt Readings from Last 3 Encounters:  08/02/17 188 lb (85.3  kg)  07/30/17 185 lb 14.4 oz (84.3 kg)  07/28/17 192 lb (87.1 kg)    Physical Exam  Constitutional: He is oriented to person, place, and time. He appears well-developed. No distress.  NAD  HENT:  Mouth/Throat: Oropharynx is clear and moist.  Eyes: Pupils are equal, round, and reactive to light. Conjunctivae are normal.  Neck: Normal range of motion. No JVD present. No thyromegaly present.  Cardiovascular: Normal rate, regular rhythm, normal heart sounds and intact distal pulses.  Exam reveals no gallop and no friction rub.   No murmur heard. Pulmonary/Chest: Effort normal and breath sounds normal. No respiratory distress. He has no wheezes. He has no rales. He exhibits no tenderness.  Abdominal: Soft. Bowel sounds are normal. He exhibits no distension and no mass. There is no tenderness. There is no rebound and no guarding.  Musculoskeletal: Normal range of motion. He exhibits no edema or tenderness.  Lymphadenopathy:    He has no cervical adenopathy.  Neurological: He is alert and oriented to person, place, and time. He has normal reflexes. No cranial nerve deficit. He exhibits normal muscle tone. He displays a negative Romberg sign. Coordination and gait normal.  Skin: Skin is warm and dry. No rash noted.  Psychiatric: He has a normal mood and affect. His behavior is normal. Judgment and thought content normal.  reg w/occ irreg beats; not bradycardic Hyperemic face  Lab Results  Component Value Date   WBC 9.8 07/28/2017   HGB 16.9 07/28/2017   HCT 48.3 07/28/2017   PLT 206 07/28/2017   GLUCOSE 100 (H) 07/29/2017   CHOL 181 03/06/2017   TRIG 119.0 03/06/2017   HDL 50.00 03/06/2017   LDLDIRECT 138.6 11/05/2014   LDLCALC 107 (H) 03/06/2017   ALT 38 06/30/2017   AST 33 06/30/2017   NA 138 07/29/2017   K 3.6 07/29/2017   CL 104 07/29/2017   CREATININE 0.81 07/29/2017   BUN 16 07/29/2017   CO2 25 07/29/2017   TSH 3.33 06/30/2017   PSA 0.87 03/06/2017   INR 1.10 06/22/2015     HGBA1C 5.7 03/06/2017    No results found.  Assessment & Plan:   There are no diagnoses linked to this encounter. I am having Mr. Bruschi maintain his vitamin B-12, aspirin EC, cholecalciferol, amLODipine, hydrALAZINE, atorvastatin, clonazePAM, triamterene-hydrochlorothiazide, sildenafil, OVER THE COUNTER MEDICATION, and cycloSPORINE. We will continue to administer ipratropium-albuterol.  No orders of the defined types were placed in this encounter.    Follow-up: No Follow-up on file.  [name], MD

## 2017-08-02 NOTE — Assessment & Plan Note (Signed)
bradycardia w/heart block, CAD f/u. Pacemaker is a possibility. Off Atenolol...   Hx per d/c summary 07/30/17:  "On admission, the patient's heart rates were bradycardic, but after holding atenolol blood pressure had improved. He continues to have bradycardia heart rate 63 bpm. EKG on discharge revealed first-degree AV block heart rate of 60 bpm. Discussion for pacemaker was had with the patient by Dr. Rayann Heman and he currently refuses. He was seen and examined by Dr. Rayann Heman on discharge and found to be stable without any complaints of chest pain or shortness of breath.   He was continued on amlodipine, atorvastatin, hydralazine, and triamterene HCTZ on discharge. He will follow-up with Dr. Rayann Heman as an outpatient, message was RE sent to his practice. He will also follow up with Dr. Burt Knack on discharge. No further changes in medication regimen other than discontinuation of atenolol were made. No diagnostic testing was completed."

## 2017-08-02 NOTE — Assessment & Plan Note (Signed)
Phlebotomy should help - will schedule Goal Hematocrit <45%

## 2017-08-02 NOTE — Assessment & Plan Note (Addendum)
Phlebotomy should help On Hydralazine, Amlodipine, Maxzide Phlebotomy should help

## 2017-08-03 NOTE — Telephone Encounter (Signed)
Patient states he still feels fatigued. His HR is around 105-108. Scheduled patient with Dr. Rayann Heman tomorrow for post hospital follow-up since symptoms have not improved at all. Patient agrees with treatment plan and was grateful for call.

## 2017-08-03 NOTE — Telephone Encounter (Signed)
Follow up   Pt would like response today if possible

## 2017-08-04 ENCOUNTER — Ambulatory Visit (INDEPENDENT_AMBULATORY_CARE_PROVIDER_SITE_OTHER): Payer: PPO | Admitting: Internal Medicine

## 2017-08-04 ENCOUNTER — Encounter: Payer: Self-pay | Admitting: Internal Medicine

## 2017-08-04 VITALS — BP 138/70 | HR 87 | Ht 65.0 in | Wt 189.0 lb

## 2017-08-04 DIAGNOSIS — I442 Atrioventricular block, complete: Secondary | ICD-10-CM

## 2017-08-04 DIAGNOSIS — I10 Essential (primary) hypertension: Secondary | ICD-10-CM | POA: Diagnosis not present

## 2017-08-04 DIAGNOSIS — I441 Atrioventricular block, second degree: Secondary | ICD-10-CM

## 2017-08-04 DIAGNOSIS — I2510 Atherosclerotic heart disease of native coronary artery without angina pectoris: Secondary | ICD-10-CM

## 2017-08-04 NOTE — Patient Instructions (Addendum)
Medication Instructions:  Your physician recommends that you continue on your current medications as directed. Please refer to the Current Medication list given to you today.   Labwork: None ordered   Testing/Procedures: None ordered   Follow-Up: Your physician recommends that you schedule a follow-up appointment in: 2 weeks with Dr Allred---08/18/17 at 1:45  Thank you for choosing [facility]!!     Janan Halter, RN [phone]

## 2017-08-04 NOTE — Progress Notes (Signed)
PCP: Cassandria Anger, MD Primary Cardiologist:  Dr Burt Knack Primary EP: Dr Meredith Mody is a 66 y.o. male who presents today for routine electrophysiology followup.  Since his recent hospital discharge, the patient reports doing very well.  His heart rates have been 60s-90s.  The fluctuation in his heart rates have been a little concerning to him.  He still has some fatigue.  He continues to wish to avoid pacing.  He is heading to the beach this weekend. Today, he denies symptoms of palpitations, chest pain, shortness of breath,  lower extremity edema, dizziness, presyncope, or syncope.  The patient is otherwise without complaint today.   Past Medical History:  Diagnosis Date  . Anxiety   . Arthritis   . CAD (coronary artery disease)    Mild plaque (cath "years ago"); abnormal Myoview 04/2013 with subsequent CABG x 5 with LIMA to LAD, SVG to OM1, SVG to DX, SVG to PD & PL.   Marland Kitchen COPD (chronic obstructive pulmonary disease) ([place])   . GERD (gastroesophageal reflux disease)   . History of colonic polyps   . Hx of echocardiogram    Echo (9/15):  EF 55-60%; Gr 2 DD, mild BAE  . Hyperlipidemia   . Hypertension   . LBP (low back pain)   . Meralgia paresthetica of left side 2011  . PVD (peripheral vascular disease) ([place])    Stent to left common femoral and right superficial femoral.  2008.  50%  left renal   . Second degree AV block, Mobitz type I   . Shortness of breath    "once in awhile; can happen at anytime" (08/26/2013)  . Sleep apnea    mod OSA, central sleep apnea/hypoapnea syndrome 11/22/12, CPAP every night   . Vitamin D deficiency    Past Surgical History:  Procedure Laterality Date  . ABDOMINAL AORTAGRAM N/A 11/16/2011   Procedure: ABDOMINAL Maxcine Ham;  Surgeon: Sherren Mocha, MD;  Location: [facility] CATH LAB;  Service: Cardiovascular;  Laterality: N/A;  . ABDOMINAL AORTAGRAM N/A 11/14/2012   Procedure: ABDOMINAL Maxcine Ham;  Surgeon: Sherren Mocha, MD;  Location[facility] CATH  LAB;  Service: Cardiovascular;  Laterality: N/A;  . ABDOMINAL AORTAGRAM N/A 12/30/2014   Procedure: ABDOMINAL Maxcine Ham;  Surgeon: Serafina Mitchell, MD;  Location: [facility] CATH LAB;  Service: Cardiovascular;  Laterality: N/A;  . CARDIAC CATHETERIZATION  05/02/13   x2   . CORONARY ARTERY BYPASS GRAFT N/A 05/06/2013   Procedure: CORONARY ARTERY BYPASS GRAFTING (CABG);  Surgeon: Melrose Nakayama, MD;  Location: [place];  Service: Open Heart Surgery;  Laterality: N/A;  Coronary artery bypass graft times five using left internal mammary artery and left greater saphenous vein via endovein harvest.  . ENDARTERECTOMY FEMORAL Left 01/29/2015   Procedure: ENDARTERECTOMY FEMORAL WITH PATCH ANGIOPLASTY;  Surgeon: Serafina Mitchell, MD;  Location: [place];  Service: Vascular;  Laterality: Left;  . EYE SURGERY  03/24/16   cataract surgery on left eye  . FEMORAL-POPLITEAL BYPASS GRAFT  12/27/2012   Procedure: BYPASS GRAFT FEMORAL-POPLITEAL ARTERY;  Surgeon: Serafina Mitchell, MD;  Location: [facility] OR;  Service: Vascular;  Laterality: Right;  using non-reversed sapphenous vein.  Marland Kitchen ILIAC ATHERECTOMY Left 01/29/2015   Procedure: SUPERFICIAL FEMORAL ARTERY ATHERECTOMY/PERCUTANEOUS TRANSLUMINAL ANGIOPLASTY; superficial femoral artery stent;  Surgeon: Serafina Mitchell, MD;  Location: [place];  Service: Vascular;  Laterality: Left;  . LOWER EXTREMITY ANGIOGRAM Bilateral 11/16/2011   Procedure: LOWER EXTREMITY ANGIOGRAM;  Surgeon: Sherren Mocha, MD;  Location: [facility] CATH  LAB;  Service: Cardiovascular;  Laterality: Bilateral;  . lower extremity stents     bilateral lower extremities x 2  . PERCUTANEOUS STENT INTERVENTION Left 11/16/2011   Procedure: PERCUTANEOUS STENT INTERVENTION;  Surgeon: Sherren Mocha, MD;  Location: [facility] CATH LAB;  Service: Cardiovascular;  Laterality: Left;  . TONSILLECTOMY    . TOTAL HIP ARTHROPLASTY Left 06/29/2015   Procedure: TOTAL HIP ARTHROPLASTY;  Surgeon: Frederik Pear, MD;  Location: [place];  Service: Orthopedics;   Laterality: Left;  LEFT TOTAL HIP ARTHROPLASTY DEPUY SROM/PINNACLE    ROS- all systems are reviewed and negatives except as per HPI above  Current Outpatient Prescriptions  Medication Sig Dispense Refill  . amLODipine (NORVASC) 5 MG tablet Take 1 tablet (5 mg total) by mouth daily. 90 tablet 3  . aspirin EC 325 MG tablet Take 1 tablet (325 mg total) by mouth 2 (two) times daily. 30 tablet 0  . atorvastatin (LIPITOR) 20 MG tablet Take 1 tablet (20 mg total) by mouth daily. 90 tablet 3  . cholecalciferol (VITAMIN D) 1000 units tablet Take 2 tablets (2,000 Units total) by mouth daily. (Patient taking differently: Take 1,000 Units by mouth daily. ) 100 tablet 3  . clonazePAM (KLONOPIN) 0.5 MG tablet Take 1 tablet (0.5 mg total) by mouth 2 (two) times daily as needed for anxiety. 60 tablet 1  . cycloSPORINE (RESTASIS) 0.05 % ophthalmic emulsion Place 1 drop into both eyes 2 (two) times daily.    . hydrALAZINE (APRESOLINE) 50 MG tablet TAKE 50 MG BY MOUTH THREE TIMES DAILY 270 tablet 3  . OVER THE COUNTER MEDICATION Take 1 capsule by mouth daily. "Nugenix" Natural Testosterone Booster    . sildenafil (REVATIO) 20 MG tablet Take 20-100 mg by mouth daily as needed (ED).    . triamterene-hydrochlorothiazide (MAXZIDE-25) 37.5-25 MG tablet Take 1 tablet by mouth daily. 30 tablet 11  . vitamin B-12 (CYANOCOBALAMIN) 1000 MCG tablet Take 1,000 mcg by mouth daily.     Current Facility-Administered Medications  Medication Dose Route Frequency Provider Last Rate Last Dose  . ipratropium-albuterol (DUONEB) 0.5-2.5 (3) MG/3ML nebulizer solution 3 mL  3 mL Nebulization Once Janith Lima, MD        Physical Exam: Vitals:   08/04/17 1546  BP: 138/70  Pulse: 87  Weight: 189 lb (85.7 kg)  Height: 5\' 5"  (1.651 m)    GEN- The patient is well appearing, alert and oriented x 3 today.   Head- normocephalic, atraumatic Eyes-  Sclera clear, conjunctiva pink Ears- hearing intact Oropharynx- clear Lungs-  Clear to ausculation bilaterally, normal work of breathing Heart- Regularly irregular rhythm, no murmurs, rubs or gallops, PMI not laterally displaced GI- soft, NT, ND, + BS Extremities- no clubbing, cyanosis, or edema  EKG tracing ordered today is personally reviewed and shows sinus rhythm with mobitz I second degree AV block  Assessment and Plan:  1. Mobitz I second degree AV block Conduction is improved for now He wishes to avoid pacing Will follow closely Keep off of atenolol  2. HTN I have encouraged him to keep close records of his BP and HR at home for me.  3. CAD No ischemic symptoms No changes  Return to see me in 2-4 weeks  Thompson Grayer MD, [facility] 08/04/2017 4:27 PM

## 2017-08-10 ENCOUNTER — Ambulatory Visit: Payer: PPO | Admitting: Nurse Practitioner

## 2017-08-18 ENCOUNTER — Ambulatory Visit (INDEPENDENT_AMBULATORY_CARE_PROVIDER_SITE_OTHER): Payer: PPO | Admitting: Internal Medicine

## 2017-08-18 ENCOUNTER — Encounter: Payer: Self-pay | Admitting: Internal Medicine

## 2017-08-18 VITALS — BP 122/58 | HR 97 | Ht 65.0 in | Wt 190.0 lb

## 2017-08-18 DIAGNOSIS — I2510 Atherosclerotic heart disease of native coronary artery without angina pectoris: Secondary | ICD-10-CM

## 2017-08-18 DIAGNOSIS — I441 Atrioventricular block, second degree: Secondary | ICD-10-CM

## 2017-08-18 DIAGNOSIS — I10 Essential (primary) hypertension: Secondary | ICD-10-CM | POA: Diagnosis not present

## 2017-08-18 NOTE — Progress Notes (Signed)
PCP: Cassandria Anger, MD Primary Cardiologist: Dr Burt Knack Primary EP: Dr Meredith Mody is a 66 y.o. male who presents today for routine electrophysiology followup.  Since last being seen in our clinic, the patient reports doing reasonably well.  + fatigue.  BP is occasional elevated.  Heart rates 90s. Today, he denies symptoms of palpitations, chest pain, shortness of breath,  lower extremity edema, dizziness, presyncope, or syncope.  The patient is otherwise without complaint today.   Past Medical History:  Diagnosis Date  . Anxiety   . Arthritis   . CAD (coronary artery disease)    Mild plaque (cath "years ago"); abnormal Myoview 04/2013 with subsequent CABG x 5 with LIMA to LAD, SVG to OM1, SVG to DX, SVG to PD & PL.   Marland Kitchen COPD (chronic obstructive pulmonary disease) ([place])   . GERD (gastroesophageal reflux disease)   . History of colonic polyps   . Hx of echocardiogram    Echo (9/15):  EF 55-60%; Gr 2 DD, mild BAE  . Hyperlipidemia   . Hypertension   . LBP (low back pain)   . Meralgia paresthetica of left side 2011  . PVD (peripheral vascular disease) ([place])    Stent to left common femoral and right superficial femoral.  2008.  50%  left renal   . Second degree AV block, Mobitz type I   . Shortness of breath    "once in awhile; can happen at anytime" (08/26/2013)  . Sleep apnea    mod OSA, central sleep apnea/hypoapnea syndrome 11/22/12, CPAP every night   . Vitamin D deficiency    Past Surgical History:  Procedure Laterality Date  . ABDOMINAL AORTAGRAM N/A 11/16/2011   Procedure: ABDOMINAL Maxcine Ham;  Surgeon: Sherren Mocha, MD;  Location: [facility] CATH LAB;  Service: Cardiovascular;  Laterality: N/A;  . ABDOMINAL AORTAGRAM N/A 11/14/2012   Procedure: ABDOMINAL Maxcine Ham;  Surgeon: Sherren Mocha, MD;  Location: [facility] CATH LAB;  Service: Cardiovascular;  Laterality: N/A;  . ABDOMINAL AORTAGRAM N/A 12/30/2014   Procedure: ABDOMINAL Maxcine Ham;  Surgeon: Serafina Mitchell, MD;   Location: [facility] CATH LAB;  Service: Cardiovascular;  Laterality: N/A;  . CARDIAC CATHETERIZATION  05/02/13   x2   . CORONARY ARTERY BYPASS GRAFT N/A 05/06/2013   Procedure: CORONARY ARTERY BYPASS GRAFTING (CABG);  Surgeon: Melrose Nakayama, MD;  Location: [place];  Service: Open Heart Surgery;  Laterality: N/A;  Coronary artery bypass graft times five using left internal mammary artery and left greater saphenous vein via endovein harvest.  . ENDARTERECTOMY FEMORAL Left 01/29/2015   Procedure: ENDARTERECTOMY FEMORAL WITH PATCH ANGIOPLASTY;  Surgeon: Serafina Mitchell, MD;  Location: [place];  Service: Vascular;  Laterality: Left;  . EYE SURGERY  03/24/16   cataract surgery on left eye  . FEMORAL-POPLITEAL BYPASS GRAFT  12/27/2012   Procedure: BYPASS GRAFT FEMORAL-POPLITEAL ARTERY;  Surgeon: Serafina Mitchell, MD;  Location: [facility] OR;  Service: Vascular;  Laterality: Right;  using non-reversed sapphenous vein.  Marland Kitchen ILIAC ATHERECTOMY Left 01/29/2015   Procedure: SUPERFICIAL FEMORAL ARTERY ATHERECTOMY/PERCUTANEOUS TRANSLUMINAL ANGIOPLASTY; superficial femoral artery stent;  Surgeon: Serafina Mitchell, MD;  Location: [place];  Service: Vascular;  Laterality: Left;  . LOWER EXTREMITY ANGIOGRAM Bilateral 11/16/2011   Procedure: LOWER EXTREMITY ANGIOGRAM;  Surgeon: Sherren Mocha, MD;  Location: [facility] CATH LAB;  Service: Cardiovascular;  Laterality: Bilateral;  . lower extremity stents     bilateral lower extremities x 2  . PERCUTANEOUS STENT INTERVENTION Left 11/16/2011   Procedure:  PERCUTANEOUS STENT INTERVENTION;  Surgeon: Sherren Mocha, MD;  Location[facility] CATH LAB;  Service: Cardiovascular;  Laterality: Left;  . TONSILLECTOMY    . TOTAL HIP ARTHROPLASTY Left 06/29/2015   Procedure: TOTAL HIP ARTHROPLASTY;  Surgeon: Frederik Pear, MD;  Location: [place];  Service: Orthopedics;  Laterality: Left;  LEFT TOTAL HIP ARTHROPLASTY DEPUY SROM/PINNACLE    ROS- all systems are reviewed and negatives except as per HPI above  Current  Outpatient Prescriptions  Medication Sig Dispense Refill  . amLODipine (NORVASC) 5 MG tablet Take 1 tablet (5 mg total) by mouth daily. 90 tablet 3  . aspirin EC 325 MG tablet Take 1 tablet (325 mg total) by mouth 2 (two) times daily. 30 tablet 0  . atorvastatin (LIPITOR) 20 MG tablet Take 1 tablet (20 mg total) by mouth daily. 90 tablet 3  . cholecalciferol (VITAMIN D) 1000 units tablet Take 2,000 Units by mouth daily.    . clonazePAM (KLONOPIN) 0.5 MG tablet Take 1 tablet (0.5 mg total) by mouth 2 (two) times daily as needed for anxiety. 60 tablet 1  . cycloSPORINE (RESTASIS) 0.05 % ophthalmic emulsion Place 1 drop into both eyes 2 (two) times daily.    . hydrALAZINE (APRESOLINE) 50 MG tablet TAKE 50 MG BY MOUTH THREE TIMES DAILY 270 tablet 3  . OVER THE COUNTER MEDICATION Take 1 capsule by mouth daily. "Nugenix" Natural Testosterone Booster    . sildenafil (REVATIO) 20 MG tablet Take 20-100 mg by mouth daily as needed (ED).    . triamterene-hydrochlorothiazide (MAXZIDE-25) 37.5-25 MG tablet Take 1 tablet by mouth daily. 30 tablet 11  . vitamin B-12 (CYANOCOBALAMIN) 1000 MCG tablet Take 1,000 mcg by mouth daily.     Current Facility-Administered Medications  Medication Dose Route Frequency Provider Last Rate Last Dose  . ipratropium-albuterol (DUONEB) 0.5-2.5 (3) MG/3ML nebulizer solution 3 mL  3 mL Nebulization Once Janith Lima, MD        Physical Exam: Vitals:   08/18/17 1333  BP: (!) 122/58  Pulse: 97  SpO2: 97%  Weight: 190 lb (86.2 kg)  Height: 5\' 5"  (1.651 m)    GEN- The patient is well appearing, alert and oriented x 3 today.   Head- normocephalic, atraumatic Eyes-  Sclera clear, conjunctiva pink Ears- hearing intact Oropharynx- clear Lungs- Clear to ausculation bilaterally, normal work of breathing Heart- Regular rate and rhythm, no murmurs, rubs or gallops, PMI not laterally displaced GI- soft, NT, ND, + BS Extremities- no clubbing, cyanosis, or edema  EKG  tracing ordered today is personally reviewed and shows sinus rhythm with first degree AV block (PR 312 msec), inferior infarct pattern  Assessment and Plan:  1. Mobitz I second degree AV block Improved He actually wishes that his heart rate was  "lower" No changes at this time  2. HTN Elevated at times Controlled today No changes Lifestyle modification encouraged  3. CAD No ischemic symptoms No changes He would like to restart cardiac rehab, which I have encouraged  Return in 6 weeks to see Dr Burt Knack I will see as needed going forward  Thompson Grayer MD, [facility] 08/18/2017 1:47 PM

## 2017-08-18 NOTE — Patient Instructions (Addendum)
Medication Instructions:  Your physician recommends that you continue on your current medications as directed. Please refer to the Current Medication list given to you today.   Labwork: None ordered   Testing/Procedures: None ordered   Follow-Up: Your physician recommends that you schedule a follow-up appointment in: 4-6 weeks with Dr Burt Knack and as needed with Dr Rayann Heman   Any Other Special Instructions Will Be Listed Below (If Applicable).     If you need a refill on your cardiac medications before your next appointment, please call your pharmacy.

## 2017-08-22 ENCOUNTER — Telehealth: Payer: Self-pay

## 2017-08-22 ENCOUNTER — Other Ambulatory Visit: Payer: Self-pay | Admitting: Family

## 2017-08-22 NOTE — Telephone Encounter (Signed)
-----   Message from Sherren Mocha, MD sent at 08/21/2017 10:59 PM EDT ----- thx [place] [facility] - can you let him know it's ok for him to return to cardiac rehab?  ----- Message ----- From: Thompson Grayer, MD Sent: 08/18/2017   2:11 PM To: Sherren Mocha, MD  He seems to be doing well off of Atenolol.  Wants to know if he can restart cardiac rehab.

## 2017-08-22 NOTE — Telephone Encounter (Signed)
Patient understands it is OK for him to return to Cardiac Rehab. He understands a message will be sent to Cardiac Rehab to arrange.  Per Dr. Rayann Heman, scheduled patient 10/26 with Dr. Burt Knack. Patient agrees with treatment plan.

## 2017-08-22 NOTE — Telephone Encounter (Signed)
Called to: 1) informed patient he may return to Cardiac Rehab 2) scheduled 4-6 week OV with Dr. Burt Knack per Dr. Jackalyn Lombard note  Left message to call back.

## 2017-08-24 DIAGNOSIS — G4730 Sleep apnea, unspecified: Secondary | ICD-10-CM | POA: Diagnosis not present

## 2017-08-31 ENCOUNTER — Ambulatory Visit (INDEPENDENT_AMBULATORY_CARE_PROVIDER_SITE_OTHER): Payer: PPO | Admitting: Internal Medicine

## 2017-08-31 ENCOUNTER — Encounter: Payer: Self-pay | Admitting: Internal Medicine

## 2017-08-31 ENCOUNTER — Other Ambulatory Visit (INDEPENDENT_AMBULATORY_CARE_PROVIDER_SITE_OTHER): Payer: PPO

## 2017-08-31 VITALS — BP 138/70 | HR 76 | Temp 98.5°F | Ht 65.0 in | Wt 193.0 lb

## 2017-08-31 DIAGNOSIS — M5440 Lumbago with sciatica, unspecified side: Secondary | ICD-10-CM | POA: Diagnosis not present

## 2017-08-31 DIAGNOSIS — I440 Atrioventricular block, first degree: Secondary | ICD-10-CM | POA: Diagnosis not present

## 2017-08-31 DIAGNOSIS — G8929 Other chronic pain: Secondary | ICD-10-CM | POA: Diagnosis not present

## 2017-08-31 DIAGNOSIS — L709 Acne, unspecified: Secondary | ICD-10-CM | POA: Diagnosis not present

## 2017-08-31 DIAGNOSIS — Z23 Encounter for immunization: Secondary | ICD-10-CM | POA: Diagnosis not present

## 2017-08-31 DIAGNOSIS — I110 Hypertensive heart disease with heart failure: Secondary | ICD-10-CM

## 2017-08-31 DIAGNOSIS — E559 Vitamin D deficiency, unspecified: Secondary | ICD-10-CM

## 2017-08-31 DIAGNOSIS — D751 Secondary polycythemia: Secondary | ICD-10-CM | POA: Diagnosis not present

## 2017-08-31 LAB — CBC
HCT: 48.2 % (ref 39.0–52.0)
Hemoglobin: 16.3 g/dL (ref 13.0–17.0)
MCHC: 33.9 g/dL (ref 30.0–36.0)
MCV: 97.9 fl (ref 78.0–100.0)
PLATELETS: 261 10*3/uL (ref 150.0–400.0)
RBC: 4.93 Mil/uL (ref 4.22–5.81)
RDW: 13.9 % (ref 11.5–15.5)
WBC: 6.8 10*3/uL (ref 4.0–10.5)

## 2017-08-31 MED ORDER — METRONIDAZOLE 0.75 % EX CREA: TOPICAL_CREAM | Freq: Two times a day (BID) | CUTANEOUS | 3 refills | Status: DC

## 2017-08-31 NOTE — Addendum Note (Signed)
Addended by: Karren Cobble on: 08/31/2017 11:09 AM   Modules accepted: Orders

## 2017-08-31 NOTE — Assessment & Plan Note (Signed)
off atenolol

## 2017-08-31 NOTE — Assessment & Plan Note (Signed)
Vit D 

## 2017-08-31 NOTE — Assessment & Plan Note (Signed)
?  rosacea ?hydralazine related Doxy intolerant - d/c Metro cream phlebotomy

## 2017-08-31 NOTE — Assessment & Plan Note (Signed)
On Hydralazine, Amlodipine, Maxzide

## 2017-08-31 NOTE — Progress Notes (Signed)
Subjective:  Patient ID: Douglas Christensen, male    DOB: January 29, 1951  Age: 66 y.o. MRN: [id number]  CC: No chief complaint on file.   HPI MAEL DELAP presents for AV block - off atenolol now. F/u HTN, CAD, anxiety  Outpatient Medications Prior to Visit  Medication Sig Dispense Refill  . amLODipine (NORVASC) 5 MG tablet Take 1 tablet (5 mg total) by mouth daily. 90 tablet 3  . aspirin EC 325 MG tablet Take 1 tablet (325 mg total) by mouth 2 (two) times daily. 30 tablet 0  . atorvastatin (LIPITOR) 20 MG tablet Take 1 tablet (20 mg total) by mouth daily. 90 tablet 3  . cholecalciferol (VITAMIN D) 1000 units tablet Take 2,000 Units by mouth daily.    . clonazePAM (KLONOPIN) 0.5 MG tablet Take 1 tablet (0.5 mg total) by mouth 2 (two) times daily as needed for anxiety. 60 tablet 1  . cycloSPORINE (RESTASIS) 0.05 % ophthalmic emulsion Place 1 drop into both eyes 2 (two) times daily.    . hydrALAZINE (APRESOLINE) 50 MG tablet TAKE 50 MG BY MOUTH THREE TIMES DAILY 270 tablet 3  . OVER THE COUNTER MEDICATION Take 1 capsule by mouth daily. "Nugenix" Natural Testosterone Booster    . sildenafil (REVATIO) 20 MG tablet Take 20-100 mg by mouth daily as needed (ED).    . triamterene-hydrochlorothiazide (MAXZIDE-25) 37.5-25 MG tablet Take 1 tablet by mouth daily. 30 tablet 11  . vitamin B-12 (CYANOCOBALAMIN) 1000 MCG tablet Take 1,000 mcg by mouth daily.     Facility-Administered Medications Prior to Visit  Medication Dose Route Frequency Provider Last Rate Last Dose  . ipratropium-albuterol (DUONEB) 0.5-2.5 (3) MG/3ML nebulizer solution 3 mL  3 mL Nebulization Once Janith Lima, MD        ROS Review of Systems  Constitutional: Negative for appetite change, fatigue and unexpected weight change.  HENT: Negative for congestion, nosebleeds, sneezing, sore throat and trouble swallowing.   Eyes: Negative for itching and visual disturbance.  Respiratory: Negative for cough.   Cardiovascular: Positive for  palpitations. Negative for chest pain and leg swelling.  Gastrointestinal: Negative for abdominal distention, blood in stool, diarrhea and nausea.  Genitourinary: Negative for frequency and hematuria.  Musculoskeletal: Negative for back pain, gait problem, joint swelling and neck pain.  Skin: Positive for rash.  Neurological: Negative for dizziness, tremors, speech difficulty and weakness.  Psychiatric/Behavioral: Negative for agitation, dysphoric mood and sleep disturbance. The patient is not nervous/anxious.     Objective:  BP 138/70 (BP Location: Left Arm, Patient Position: Sitting, Cuff Size: Large)   Pulse 76   Temp 98.5 F (36.9 C) (Oral)   Ht 5\' 5"  (1.651 m)   Wt 193 lb (87.5 kg)   SpO2 98%   BMI 32.12 kg/m   BP Readings from Last 3 Encounters:  08/31/17 138/70  08/18/17 (!) 122/58  08/04/17 138/70    Wt Readings from Last 3 Encounters:  08/31/17 193 lb (87.5 kg)  08/18/17 190 lb (86.2 kg)  08/04/17 189 lb (85.7 kg)    Physical Exam  Constitutional: He is oriented to person, place, and time. He appears well-developed. No distress.  NAD  HENT:  Mouth/Throat: Oropharynx is clear and moist.  Eyes: Pupils are equal, round, and reactive to light. Conjunctivae are normal.  Neck: Normal range of motion. No JVD present. No thyromegaly present.  Cardiovascular: Normal rate, regular rhythm, normal heart sounds and intact distal pulses.  Exam reveals no gallop and no friction  rub.   No murmur heard. Pulmonary/Chest: Effort normal and breath sounds normal. No respiratory distress. He has no wheezes. He has no rales. He exhibits no tenderness.  Abdominal: Soft. Bowel sounds are normal. He exhibits no distension and no mass. There is no tenderness. There is no rebound and no guarding.  Musculoskeletal: Normal range of motion. He exhibits no edema or tenderness.  Lymphadenopathy:    He has no cervical adenopathy.  Neurological: He is alert and oriented to person, place, and  time. He has normal reflexes. No cranial nerve deficit. He exhibits normal muscle tone. He displays a negative Romberg sign. Coordination and gait normal.  Skin: Skin is warm and dry. Rash noted.  Psychiatric: He has a normal mood and affect. His behavior is normal. Judgment and thought content normal.  rosacea on face  Lab Results  Component Value Date   WBC 9.2 08/02/2017   HGB 17.2 (H) 08/02/2017   HCT 49.8 08/02/2017   PLT 238.0 08/02/2017   GLUCOSE 100 (H) 07/29/2017   CHOL 181 03/06/2017   TRIG 119.0 03/06/2017   HDL 50.00 03/06/2017   LDLDIRECT 138.6 11/05/2014   LDLCALC 107 (H) 03/06/2017   ALT 38 06/30/2017   AST 33 06/30/2017   NA 138 07/29/2017   K 3.6 07/29/2017   CL 104 07/29/2017   CREATININE 0.81 07/29/2017   BUN 16 07/29/2017   CO2 25 07/29/2017   TSH 3.33 06/30/2017   PSA 0.87 03/06/2017   INR 1.10 06/22/2015   HGBA1C 5.7 03/06/2017    No results found.  Assessment & Plan:   There are no diagnoses linked to this encounter. I am having Mr. Cundari maintain his vitamin B-12, aspirin EC, amLODipine, hydrALAZINE, atorvastatin, clonazePAM, triamterene-hydrochlorothiazide, sildenafil, OVER THE COUNTER MEDICATION, cycloSPORINE, and cholecalciferol. We will continue to administer ipratropium-albuterol.  No orders of the defined types were placed in this encounter.    Follow-up: No Follow-up on file.  [name], MD

## 2017-08-31 NOTE — Assessment & Plan Note (Signed)
Phlebotomy today.

## 2017-08-31 NOTE — Assessment & Plan Note (Signed)
OK now

## 2017-09-01 ENCOUNTER — Other Ambulatory Visit: Payer: Self-pay

## 2017-09-01 NOTE — Telephone Encounter (Signed)
Requesting: Clonazepam Contract: No UDS: No Last OV: 10.04.18 Next OV: Not scheduled Last Refill: 8.03.2018   Please advise

## 2017-09-04 NOTE — Telephone Encounter (Signed)
OK to fill this/these prescription(s) with additional refills x2 Thank you!  

## 2017-09-05 MED ORDER — CLONAZEPAM 0.5 MG PO TABS: 0.5000 mg | ORAL_TABLET | Freq: Two times a day (BID) | ORAL | 2 refills | Status: DC | PRN

## 2017-09-05 NOTE — Telephone Encounter (Signed)
Called refill [facility] spoke w/pharmmacist gave MD authorization,,,/lmb

## 2017-09-05 NOTE — Telephone Encounter (Signed)
Pharmacy called and  has not received this  please advise  [facility]

## 2017-09-06 ENCOUNTER — Ambulatory Visit: Payer: PPO | Admitting: Internal Medicine

## 2017-09-06 DIAGNOSIS — E559 Vitamin D deficiency, unspecified: Secondary | ICD-10-CM | POA: Diagnosis not present

## 2017-09-06 DIAGNOSIS — L709 Acne, unspecified: Secondary | ICD-10-CM | POA: Diagnosis not present

## 2017-09-06 DIAGNOSIS — I110 Hypertensive heart disease with heart failure: Secondary | ICD-10-CM | POA: Diagnosis not present

## 2017-09-06 DIAGNOSIS — I440 Atrioventricular block, first degree: Secondary | ICD-10-CM | POA: Diagnosis not present

## 2017-09-06 DIAGNOSIS — G8929 Other chronic pain: Secondary | ICD-10-CM | POA: Diagnosis not present

## 2017-09-06 DIAGNOSIS — M5440 Lumbago with sciatica, unspecified side: Secondary | ICD-10-CM | POA: Diagnosis not present

## 2017-09-06 DIAGNOSIS — D751 Secondary polycythemia: Secondary | ICD-10-CM | POA: Diagnosis not present

## 2017-09-06 DIAGNOSIS — Z23 Encounter for immunization: Secondary | ICD-10-CM | POA: Diagnosis not present

## 2017-09-06 NOTE — Addendum Note (Signed)
Addended by: Karren Cobble on: 09/06/2017 04:32 PM   Modules accepted: Orders

## 2017-09-08 DIAGNOSIS — H01009 Unspecified blepharitis unspecified eye, unspecified eyelid: Secondary | ICD-10-CM | POA: Diagnosis not present

## 2017-09-08 DIAGNOSIS — H16143 Punctate keratitis, bilateral: Secondary | ICD-10-CM | POA: Diagnosis not present

## 2017-09-08 DIAGNOSIS — Z961 Presence of intraocular lens: Secondary | ICD-10-CM | POA: Diagnosis not present

## 2017-09-08 DIAGNOSIS — H02839 Dermatochalasis of unspecified eye, unspecified eyelid: Secondary | ICD-10-CM | POA: Diagnosis not present

## 2017-09-14 ENCOUNTER — Telehealth: Payer: Self-pay

## 2017-09-14 NOTE — Telephone Encounter (Signed)
Received information from Cardiac Rehab for patient to be in the maintenance program. Per Cardiac Rehab, patient will be a self-pay.  Left message for patient to call back to discuss options.

## 2017-09-19 NOTE — Telephone Encounter (Signed)
Informed patient that options were discussed with Cardiac Rehab. He will be considered a self-pay patient. He agrees to spend the $61 monthly out-of-pocket for Cardiac Rehab Maintenance Program. Message sent to Cardiac Rehab to send order for Dr. Burt Knack to sign. Patient was grateful for call.

## 2017-09-22 ENCOUNTER — Encounter: Payer: Self-pay | Admitting: Cardiovascular Disease

## 2017-09-22 ENCOUNTER — Ambulatory Visit (INDEPENDENT_AMBULATORY_CARE_PROVIDER_SITE_OTHER): Payer: PPO | Admitting: Cardiovascular Disease

## 2017-09-22 VITALS — BP 140/50 | HR 80 | Ht 65.0 in | Wt 198.4 lb

## 2017-09-22 DIAGNOSIS — I2510 Atherosclerotic heart disease of native coronary artery without angina pectoris: Secondary | ICD-10-CM

## 2017-09-22 DIAGNOSIS — E785 Hyperlipidemia, unspecified: Secondary | ICD-10-CM

## 2017-09-22 DIAGNOSIS — I10 Essential (primary) hypertension: Secondary | ICD-10-CM | POA: Diagnosis not present

## 2017-09-22 MED ORDER — ATORVASTATIN CALCIUM 40 MG PO TABS: 40.0000 mg | ORAL_TABLET | Freq: Every day | ORAL | 3 refills | Status: DC

## 2017-09-22 NOTE — Progress Notes (Signed)
Cardiology Office Note Date:  09/22/2017   ID:  ABDISHAKUR GOTTSCHALL, DOB 09-30-1951, MRN [id number]  PCP:  Cassandria Anger, MD  Cardiologist:  Sherren Mocha, MD    Chief Complaint  Patient presents with  . Follow-up     History of Present Illness: Douglas Christensen is a 66 y.o. male who presents for follow-up evaluation.  The patient is followed for coronary artery disease.  He underwent CABG in 2014.  He has peripheral arterial disease and is undergone femoral endarterectomy and lower extremity stenting procedures.  When he was seen in August of this year he was noted to have 2-1 heart block with a heart rate of 29 bpm.  He was sent by EMS to the emergency room and he underwent EP evaluation.  His beta-blocker was held and his heart rate normalized.  He has been followed closely by Dr. all read and has not required any further interventions.  The patient monitors his heart rate regularly and states that it generally runs in the 70s and 80s.  He is here alone today.  He has no chest pain or pressure.  He complains of shortness of breath with bending forward.  He denies leg swelling, orthopnea, PND, heart palpitations, lightheadedness, or syncope.   Past Medical History:  Diagnosis Date  . Anxiety   . Arthritis   . CAD (coronary artery disease)    Mild plaque (cath "years ago"); abnormal Myoview 04/2013 with subsequent CABG x 5 with LIMA to LAD, SVG to OM1, SVG to DX, SVG to PD & PL.   Marland Kitchen COPD (chronic obstructive pulmonary disease) ([place])   . GERD (gastroesophageal reflux disease)   . History of colonic polyps   . Hx of echocardiogram    Echo (9/15):  EF 55-60%; Gr 2 DD, mild BAE  . Hyperlipidemia   . Hypertension   . LBP (low back pain)   . Meralgia paresthetica of left side 2011  . PVD (peripheral vascular disease) ([place])    Stent to left common femoral and right superficial femoral.  2008.  50%  left renal   . Second degree AV block, Mobitz type I   . Shortness of breath    "once in  awhile; can happen at anytime" (08/26/2013)  . Sleep apnea    mod OSA, central sleep apnea/hypoapnea syndrome 11/22/12, CPAP every night   . Vitamin D deficiency     Past Surgical History:  Procedure Laterality Date  . ABDOMINAL AORTAGRAM N/A 11/16/2011   Procedure: ABDOMINAL Maxcine Ham;  Surgeon: Sherren Mocha, MD;  Location: [facility] CATH LAB;  Service: Cardiovascular;  Laterality: N/A;  . ABDOMINAL AORTAGRAM N/A 11/14/2012   Procedure: ABDOMINAL Maxcine Ham;  Surgeon: Sherren Mocha, MD;  Location: [facility] CATH LAB;  Service: Cardiovascular;  Laterality: N/A;  . ABDOMINAL AORTAGRAM N/A 12/30/2014   Procedure: ABDOMINAL Maxcine Ham;  Surgeon: Serafina Mitchell, MD;  Location: [facility] CATH LAB;  Service: Cardiovascular;  Laterality: N/A;  . CARDIAC CATHETERIZATION  05/02/13   x2   . CORONARY ARTERY BYPASS GRAFT N/A 05/06/2013   Procedure: CORONARY ARTERY BYPASS GRAFTING (CABG);  Surgeon: Melrose Nakayama, MD;  Location: [place];  Service: Open Heart Surgery;  Laterality: N/A;  Coronary artery bypass graft times five using left internal mammary artery and left greater saphenous vein via endovein harvest.  . ENDARTERECTOMY FEMORAL Left 01/29/2015   Procedure: ENDARTERECTOMY FEMORAL WITH PATCH ANGIOPLASTY;  Surgeon: Serafina Mitchell, MD;  Location: [place];  Service: Vascular;  Laterality: Left;  .  EYE SURGERY  03/24/16   cataract surgery on left eye  . FEMORAL-POPLITEAL BYPASS GRAFT  12/27/2012   Procedure: BYPASS GRAFT FEMORAL-POPLITEAL ARTERY;  Surgeon: Serafina Mitchell, MD;  Location: [facility] OR;  Service: Vascular;  Laterality: Right;  using non-reversed sapphenous vein.  Marland Kitchen ILIAC ATHERECTOMY Left 01/29/2015   Procedure: SUPERFICIAL FEMORAL ARTERY ATHERECTOMY/PERCUTANEOUS TRANSLUMINAL ANGIOPLASTY; superficial femoral artery stent;  Surgeon: Serafina Mitchell, MD;  Location: [place];  Service: Vascular;  Laterality: Left;  . LOWER EXTREMITY ANGIOGRAM Bilateral 11/16/2011   Procedure: LOWER EXTREMITY ANGIOGRAM;  Surgeon: Sherren Mocha, MD;  Location: [facility] CATH LAB;  Service: Cardiovascular;  Laterality: Bilateral;  . lower extremity stents     bilateral lower extremities x 2  . PERCUTANEOUS STENT INTERVENTION Left 11/16/2011   Procedure: PERCUTANEOUS STENT INTERVENTION;  Surgeon: Sherren Mocha, MD;  Location: [facility] CATH LAB;  Service: Cardiovascular;  Laterality: Left;  . TONSILLECTOMY    . TOTAL HIP ARTHROPLASTY Left 06/29/2015   Procedure: TOTAL HIP ARTHROPLASTY;  Surgeon: Frederik Pear, MD;  Location: [place];  Service: Orthopedics;  Laterality: Left;  LEFT TOTAL HIP ARTHROPLASTY DEPUY SROM/PINNACLE    Current Outpatient Prescriptions  Medication Sig Dispense Refill  . amLODipine (NORVASC) 5 MG tablet Take 1 tablet (5 mg total) by mouth daily. 90 tablet 3  . aspirin EC 325 MG tablet Take 1 tablet (325 mg total) by mouth 2 (two) times daily. 30 tablet 0  . atorvastatin (LIPITOR) 40 MG tablet Take 1 tablet (40 mg total) by mouth daily. 90 tablet 3  . cholecalciferol (VITAMIN D) 1000 units tablet Take 2,000 Units by mouth daily.    . clonazePAM (KLONOPIN) 0.5 MG tablet Take 1 tablet (0.5 mg total) by mouth 2 (two) times daily as needed for anxiety. 60 tablet 2  . cycloSPORINE (RESTASIS) 0.05 % ophthalmic emulsion Place 1 drop into both eyes 2 (two) times daily.    . hydrALAZINE (APRESOLINE) 50 MG tablet TAKE 50 MG BY MOUTH THREE TIMES DAILY 270 tablet 3  . metroNIDAZOLE (METROCREAM) 0.75 % cream Apply topically 2 (two) times daily. Apply to face rash 45 g 3  . OVER THE COUNTER MEDICATION Take 1 capsule by mouth daily. "Nugenix" Natural Testosterone Booster    . sildenafil (REVATIO) 20 MG tablet Take 20-100 mg by mouth daily as needed (ED).    . triamterene-hydrochlorothiazide (MAXZIDE-25) 37.5-25 MG tablet Take 1 tablet by mouth daily. 30 tablet 11  . vitamin B-12 (CYANOCOBALAMIN) 1000 MCG tablet Take 1,000 mcg by mouth daily.     Current Facility-Administered Medications  Medication Dose Route Frequency Provider Last Rate  Last Dose  . ipratropium-albuterol (DUONEB) 0.5-2.5 (3) MG/3ML nebulizer solution 3 mL  3 mL Nebulization Once Janith Lima, MD        Allergies:   Roxicodone [oxycodone hcl]; Doxycycline; Benazepril; Itraconazole; and Plavix [clopidogrel bisulfate]   Social History:  The patient  reports that he quit smoking about 4 years ago. His smoking use included Cigarettes. He has a 94.00 pack-year smoking history. He has never used smokeless tobacco. He reports that he drinks about 2.4 oz of alcohol per week . He reports that he does not use drugs.   Family History:  The patient's  family history includes Alzheimer's disease in his father; Cancer in his father and mother; Heart disease in his mother; Hyperlipidemia in his mother; Hypertension in his mother.    ROS:  Please see the history of present illness.   All other systems are reviewed and  negative.    PHYSICAL EXAM: VS:  BP (!) 140/50   Pulse 80   Ht 5\' 5"  (1.651 m)   Wt 198 lb 6.4 oz (90 kg)   BMI 33.02 kg/m  , BMI Body mass index is 33.02 kg/m. GEN: Well nourished, well developed, in no acute distress  HEENT: normal  Neck: no JVD, no masses. No carotid bruits Cardiac: RRR without murmur or gallop                Respiratory:  clear to auscultation bilaterally, normal work of breathing GI: soft, nontender, nondistended, + BS MS: no deformity or atrophy  Ext: no pretibial edema Skin: warm and dry, no rash Neuro:  Strength and sensation are intact Psych: euthymic mood, full affect  EKG:  EKG is not ordered today.  Recent Labs: 06/30/2017: ALT 38; TSH 3.33 07/29/2017: BUN 16; Creatinine, Ser 0.81; Potassium 3.6; Sodium 138 08/31/2017: Hemoglobin 16.3; Platelets 261.0   Lipid Panel     Component Value Date/Time   CHOL 181 03/06/2017 1233   TRIG 119.0 03/06/2017 1233   TRIG 118 09/25/2006 1002   HDL 50.00 03/06/2017 1233   CHOLHDL 4 03/06/2017 1233   VLDL 23.8 03/06/2017 1233   LDLCALC 107 (H) 03/06/2017 1233   LDLDIRECT  138.6 11/05/2014 0811      Wt Readings from Last 3 Encounters:  09/22/17 198 lb 6.4 oz (90 kg)  08/31/17 193 lb (87.5 kg)  08/18/17 190 lb (86.2 kg)    ASSESSMENT AND PLAN: 1.  2:1 AV block: resolved with holding his beta-blocker. Appreciate Dr Jackalyn Lombard evaluation. Continue observation. Maintaining sinus rhythm.  2.  Coronary artery disease, native vessel, without angina: Patient is doing well and will continue his current medical program.  He is on antiplatelet therapy with aspirin.  3.  Hypertension: Blood pressure is controlled on a combination of amlodipine, hydralazine, and triamterene/hydrochlorothiazide.  4.  Hyperlipidemia: LDL cholesterol is above goal.  Will intensify his statin therapy.  I asked him to increase atorvastatin to 40 mg daily.  He will have follow-up labs before he sees [facility] back in 6 months.  5.  Peripheral arterial disease: Patient is followed by vascular surgery.  He does not have any claudication symptoms at present.  Current medicines are reviewed with the patient today.  The patient does not have concerns regarding medicines.  Labs/ tests ordered today include:   Orders Placed This Encounter  Procedures  . Hepatic function panel  . Lipid panel    Disposition:   FU 6 months with Richardson Dopp, PA-C and one year with me  Signed, Sherren Mocha, MD  09/22/2017 1:21 PM    [facility] [address], [place], [place]  [address] Phone: [phone]; Fax: [phone]

## 2017-09-22 NOTE — Patient Instructions (Signed)
Medication Instructions:  1) INCREASE LIPITOR to 40 mg daily  Labwork: Your provider recommends that you return for FASTING lab work a couple days prior to your appointment in 6 months.  Testing/Procedures: None  Follow-Up: Your provider wants you to follow-up in: 6 months with Richardson Dopp, PA. You will receive a reminder letter in the mail two months in advance. If you don't receive a letter, please call our office to schedule the follow-up appointment.    Your provider wants you to follow-up in: 1 year with Dr. Burt Knack. You will receive a reminder letter in the mail two months in advance. If you don't receive a letter, please call our office to schedule the follow-up appointment.    Any Other Special Instructions Will Be Listed Below (If Applicable).     If you need a refill on your cardiac medications before your next appointment, please call your pharmacy.

## 2017-10-04 DIAGNOSIS — H01009 Unspecified blepharitis unspecified eye, unspecified eyelid: Secondary | ICD-10-CM | POA: Diagnosis not present

## 2017-10-04 DIAGNOSIS — H16143 Punctate keratitis, bilateral: Secondary | ICD-10-CM | POA: Diagnosis not present

## 2017-10-09 ENCOUNTER — Encounter (HOSPITAL_COMMUNITY)
Admission: RE | Admit: 2017-10-09 | Discharge: 2017-10-09 | Disposition: A | Discharge status: Home / Self Care | Payer: Self-pay | Source: Ambulatory Visit | Attending: Cardiovascular Disease | Admitting: Cardiovascular Disease

## 2017-10-09 DIAGNOSIS — Z951 Presence of aortocoronary bypass graft: Secondary | ICD-10-CM | POA: Insufficient documentation

## 2017-10-09 DIAGNOSIS — I739 Peripheral vascular disease, unspecified: Secondary | ICD-10-CM | POA: Insufficient documentation

## 2017-10-09 DIAGNOSIS — Z48812 Encounter for surgical aftercare following surgery on the circulatory system: Secondary | ICD-10-CM | POA: Insufficient documentation

## 2017-10-09 DIAGNOSIS — I10 Essential (primary) hypertension: Secondary | ICD-10-CM | POA: Insufficient documentation

## 2017-10-09 DIAGNOSIS — E785 Hyperlipidemia, unspecified: Secondary | ICD-10-CM | POA: Insufficient documentation

## 2017-10-11 ENCOUNTER — Encounter (HOSPITAL_COMMUNITY)
Admission: RE | Admit: 2017-10-11 | Discharge: 2017-10-11 | Disposition: A | Discharge status: Home / Self Care | Payer: Self-pay | Source: Ambulatory Visit | Attending: Cardiovascular Disease | Admitting: Cardiovascular Disease

## 2017-10-13 ENCOUNTER — Encounter (HOSPITAL_COMMUNITY): Payer: Self-pay

## 2017-10-16 ENCOUNTER — Encounter (HOSPITAL_COMMUNITY)
Admission: RE | Admit: 2017-10-16 | Discharge: 2017-10-16 | Disposition: A | Discharge status: Home / Self Care | Payer: Self-pay | Source: Ambulatory Visit | Attending: Cardiovascular Disease | Admitting: Cardiovascular Disease

## 2017-10-16 ENCOUNTER — Other Ambulatory Visit: Payer: Self-pay | Admitting: Family

## 2017-10-18 ENCOUNTER — Encounter (HOSPITAL_COMMUNITY)
Admission: RE | Admit: 2017-10-18 | Discharge: 2017-10-18 | Disposition: A | Discharge status: Home / Self Care | Payer: Self-pay | Source: Ambulatory Visit | Attending: Cardiovascular Disease | Admitting: Cardiovascular Disease

## 2017-10-23 ENCOUNTER — Encounter (HOSPITAL_COMMUNITY): Payer: Self-pay

## 2017-10-24 ENCOUNTER — Other Ambulatory Visit: Payer: Self-pay | Admitting: Family

## 2017-10-25 ENCOUNTER — Encounter (HOSPITAL_COMMUNITY)
Admission: RE | Admit: 2017-10-25 | Discharge: 2017-10-25 | Disposition: A | Discharge status: Home / Self Care | Payer: Self-pay | Source: Ambulatory Visit | Attending: Cardiovascular Disease | Admitting: Cardiovascular Disease

## 2017-10-27 ENCOUNTER — Other Ambulatory Visit: Payer: Self-pay | Admitting: Internal Medicine

## 2017-10-27 ENCOUNTER — Encounter (HOSPITAL_COMMUNITY)
Admission: RE | Admit: 2017-10-27 | Discharge: 2017-10-27 | Disposition: A | Discharge status: Home / Self Care | Payer: Self-pay | Source: Ambulatory Visit | Attending: Cardiovascular Disease | Admitting: Cardiovascular Disease

## 2017-10-27 MED ORDER — SILDENAFIL CITRATE 20 MG PO TABS: 20.0000 mg | ORAL_TABLET | Freq: Every day | ORAL | 4 refills | Status: DC | PRN

## 2017-10-27 NOTE — Telephone Encounter (Signed)
RX sent

## 2017-10-27 NOTE — Telephone Encounter (Signed)
Copied from [place] [phone]. Topic: General - Other >> Oct 27, 2017 11:18 AM Yvette Rack wrote: Reason for CRM: med refill Sildenafil 20 mg please send to [place]

## 2017-10-30 ENCOUNTER — Encounter (HOSPITAL_COMMUNITY)
Admission: RE | Admit: 2017-10-30 | Discharge: 2017-10-30 | Disposition: A | Discharge status: Home / Self Care | Payer: Self-pay | Source: Ambulatory Visit | Attending: Cardiovascular Disease | Admitting: Cardiovascular Disease

## 2017-10-30 DIAGNOSIS — E785 Hyperlipidemia, unspecified: Secondary | ICD-10-CM | POA: Insufficient documentation

## 2017-10-30 DIAGNOSIS — Z48812 Encounter for surgical aftercare following surgery on the circulatory system: Secondary | ICD-10-CM | POA: Insufficient documentation

## 2017-10-30 DIAGNOSIS — I10 Essential (primary) hypertension: Secondary | ICD-10-CM | POA: Insufficient documentation

## 2017-10-30 DIAGNOSIS — Z951 Presence of aortocoronary bypass graft: Secondary | ICD-10-CM | POA: Insufficient documentation

## 2017-10-30 DIAGNOSIS — I739 Peripheral vascular disease, unspecified: Secondary | ICD-10-CM | POA: Insufficient documentation

## 2017-11-01 ENCOUNTER — Encounter (HOSPITAL_COMMUNITY): Payer: Self-pay

## 2017-11-03 ENCOUNTER — Encounter (HOSPITAL_COMMUNITY): Payer: Self-pay

## 2017-11-06 ENCOUNTER — Encounter (HOSPITAL_COMMUNITY): Payer: Self-pay

## 2017-11-08 ENCOUNTER — Encounter (HOSPITAL_COMMUNITY): Payer: Self-pay

## 2017-11-10 ENCOUNTER — Encounter (HOSPITAL_COMMUNITY): Payer: Self-pay

## 2017-11-13 ENCOUNTER — Encounter (HOSPITAL_COMMUNITY): Payer: Self-pay

## 2017-11-15 ENCOUNTER — Encounter (HOSPITAL_COMMUNITY): Payer: Self-pay

## 2017-11-17 ENCOUNTER — Encounter (HOSPITAL_COMMUNITY): Payer: Self-pay

## 2017-11-22 ENCOUNTER — Encounter (HOSPITAL_COMMUNITY): Payer: Self-pay

## 2017-11-24 ENCOUNTER — Encounter (HOSPITAL_COMMUNITY): Payer: Self-pay

## 2017-11-27 ENCOUNTER — Encounter (HOSPITAL_COMMUNITY): Payer: Self-pay

## 2017-11-27 ENCOUNTER — Telehealth (HOSPITAL_COMMUNITY): Payer: Self-pay | Admitting: *Deleted

## 2017-11-29 ENCOUNTER — Ambulatory Visit: Payer: PPO | Admitting: Internal Medicine

## 2017-12-07 ENCOUNTER — Emergency Department (HOSPITAL_COMMUNITY): Payer: PPO

## 2017-12-07 ENCOUNTER — Other Ambulatory Visit: Payer: Self-pay

## 2017-12-07 ENCOUNTER — Encounter (HOSPITAL_COMMUNITY): Payer: Self-pay

## 2017-12-07 ENCOUNTER — Emergency Department (HOSPITAL_COMMUNITY)
Admission: EM | Admit: 2017-12-07 | Discharge: 2017-12-08 | Disposition: A | Discharge status: Home / Self Care | Payer: PPO | Attending: Emergency Medicine | Admitting: Emergency Medicine

## 2017-12-07 DIAGNOSIS — Z7982 Long term (current) use of aspirin: Secondary | ICD-10-CM | POA: Diagnosis not present

## 2017-12-07 DIAGNOSIS — Z79899 Other long term (current) drug therapy: Secondary | ICD-10-CM | POA: Diagnosis not present

## 2017-12-07 DIAGNOSIS — Z87891 Personal history of nicotine dependence: Secondary | ICD-10-CM | POA: Diagnosis not present

## 2017-12-07 DIAGNOSIS — Z951 Presence of aortocoronary bypass graft: Secondary | ICD-10-CM | POA: Insufficient documentation

## 2017-12-07 DIAGNOSIS — I2510 Atherosclerotic heart disease of native coronary artery without angina pectoris: Secondary | ICD-10-CM | POA: Diagnosis not present

## 2017-12-07 DIAGNOSIS — I10 Essential (primary) hypertension: Secondary | ICD-10-CM | POA: Diagnosis not present

## 2017-12-07 DIAGNOSIS — I119 Hypertensive heart disease without heart failure: Secondary | ICD-10-CM | POA: Insufficient documentation

## 2017-12-07 DIAGNOSIS — J449 Chronic obstructive pulmonary disease, unspecified: Secondary | ICD-10-CM | POA: Insufficient documentation

## 2017-12-07 DIAGNOSIS — Z96642 Presence of left artificial hip joint: Secondary | ICD-10-CM | POA: Insufficient documentation

## 2017-12-07 DIAGNOSIS — R002 Palpitations: Secondary | ICD-10-CM | POA: Insufficient documentation

## 2017-12-07 LAB — BASIC METABOLIC PANEL
ANION GAP: 12 (ref 5–15)
BUN: 23 mg/dL — ABNORMAL HIGH (ref 6–20)
CHLORIDE: 99 mmol/L — AB (ref 101–111)
CO2: 24 mmol/L (ref 22–32)
Calcium: 9.5 mg/dL (ref 8.9–10.3)
Creatinine, Ser: 1.24 mg/dL (ref 0.61–1.24)
GFR calc non Af Amer: 59 mL/min — ABNORMAL LOW (ref >60)
Glucose, Bld: 106 mg/dL — ABNORMAL HIGH (ref 65–99)
POTASSIUM: 3.6 mmol/L (ref 3.5–5.1)
Sodium: 135 mmol/L (ref 135–145)

## 2017-12-07 LAB — CBC
HCT: 51.8 % (ref 39.0–52.0)
HEMOGLOBIN: 18.1 g/dL — AB (ref 13.0–17.0)
MCH: 32.7 pg (ref 26.0–34.0)
MCHC: 34.9 g/dL (ref 30.0–36.0)
MCV: 93.5 fL (ref 78.0–100.0)
Platelets: 212 10*3/uL (ref 150–400)
RBC: 5.54 MIL/uL (ref 4.22–5.81)
RDW: 13.2 % (ref 11.5–15.5)
WBC: 10.9 10*3/uL — ABNORMAL HIGH (ref 4.0–10.5)

## 2017-12-07 LAB — I-STAT TROPONIN, ED: Troponin i, poc: 0.01 ng/mL (ref 0.00–0.08)

## 2017-12-07 NOTE — ED Triage Notes (Addendum)
PT reports checking bp and pulse PTA and bp was 180/90 and HR was 120. Denies dizziness, sob, chest pain. HR 103 at triage, but pt reports feeling like hes having palpitations
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
## 2017-12-08 NOTE — ED Notes (Signed)
ED Provider at bedside. 

## 2017-12-20 ENCOUNTER — Other Ambulatory Visit (INDEPENDENT_AMBULATORY_CARE_PROVIDER_SITE_OTHER): Payer: PPO

## 2017-12-20 ENCOUNTER — Encounter: Payer: Self-pay | Admitting: Internal Medicine

## 2017-12-20 ENCOUNTER — Ambulatory Visit (INDEPENDENT_AMBULATORY_CARE_PROVIDER_SITE_OTHER): Payer: PPO | Admitting: Internal Medicine

## 2017-12-20 DIAGNOSIS — E559 Vitamin D deficiency, unspecified: Secondary | ICD-10-CM

## 2017-12-20 DIAGNOSIS — E538 Deficiency of other specified B group vitamins: Secondary | ICD-10-CM

## 2017-12-20 DIAGNOSIS — D751 Secondary polycythemia: Secondary | ICD-10-CM

## 2017-12-20 DIAGNOSIS — E785 Hyperlipidemia, unspecified: Secondary | ICD-10-CM | POA: Diagnosis not present

## 2017-12-20 DIAGNOSIS — R600 Localized edema: Secondary | ICD-10-CM | POA: Diagnosis not present

## 2017-12-20 DIAGNOSIS — I110 Hypertensive heart disease with heart failure: Secondary | ICD-10-CM | POA: Diagnosis not present

## 2017-12-20 LAB — CBC
HCT: 49.4 % (ref 39.0–52.0)
HEMOGLOBIN: 17.2 g/dL — AB (ref 13.0–17.0)
MCHC: 34.7 g/dL (ref 30.0–36.0)
MCV: 93.9 fl (ref 78.0–100.0)
PLATELETS: 225 10*3/uL (ref 150.0–400.0)
RBC: 5.27 Mil/uL (ref 4.22–5.81)
RDW: 14 % (ref 11.5–15.5)
WBC: 7.5 10*3/uL (ref 4.0–10.5)

## 2017-12-20 MED ORDER — TRIAMTERENE-HCTZ 37.5-25 MG PO TABS: 2.0000 | ORAL_TABLET | Freq: Every day | ORAL | 11 refills | Status: DC

## 2017-12-20 NOTE — Assessment & Plan Note (Signed)
On R12

## 2017-12-20 NOTE — Assessment & Plan Note (Addendum)
Needs a phlebotomy Hematology ref offered

## 2017-12-20 NOTE — Assessment & Plan Note (Signed)
Hold lipitor - 2 wks due to LE weakness

## 2017-12-20 NOTE — Patient Instructions (Signed)
Hold lipitor for 2 wks to see if leg weakness gets better

## 2017-12-20 NOTE — Progress Notes (Signed)
Subjective:  Patient ID: Douglas Christensen, male    DOB: May 26, 1951  Age: 67 y.o. MRN: [id number]  CC: No chief complaint on file.   HPI Douglas Christensen presents for polycythemia, CAD, PAD, HTN - high BP  Outpatient Medications Prior to Visit  Medication Sig Dispense Refill  . amLODipine (NORVASC) 5 MG tablet Take 1 tablet (5 mg total) by mouth daily. (Patient taking differently: Take 2.5 mg by mouth daily. ) 90 tablet 3  . aspirin EC 325 MG tablet Take 1 tablet (325 mg total) by mouth 2 (two) times daily. 30 tablet 0  . atorvastatin (LIPITOR) 40 MG tablet Take 1 tablet (40 mg total) by mouth daily. 90 tablet 3  . Carboxymethylcellulose Sod PF (REFRESH CELLUVISC) 1 % GEL Place 1 drop into both eyes as needed (for dry eyes).    . cholecalciferol (VITAMIN D) 1000 units tablet Take 1,000 Units by mouth 2 (two) times daily.     . clonazePAM (KLONOPIN) 0.5 MG tablet Take 1 tablet (0.5 mg total) by mouth 2 (two) times daily as needed for anxiety. 60 tablet 2  . hydrALAZINE (APRESOLINE) 50 MG tablet TAKE 50 MG BY MOUTH THREE TIMES DAILY 270 tablet 3  . metroNIDAZOLE (METROCREAM) 0.75 % cream Apply topically 2 (two) times daily. Apply to face rash 45 g 3  . OVER THE COUNTER MEDICATION Take 1 capsule by mouth daily. "Nugenix" Natural Testosterone Booster    . sildenafil (REVATIO) 20 MG tablet Take 1-5 tablets (20-100 mg total) by mouth daily as needed (ED). 50 tablet 4  . vitamin B-12 (CYANOCOBALAMIN) 1000 MCG tablet Take 1,000 mcg by mouth daily.    Marland Kitchen triamterene-hydrochlorothiazide (MAXZIDE-25) 37.5-25 MG tablet Take 1 tablet by mouth daily. 30 tablet 11   Facility-Administered Medications Prior to Visit  Medication Dose Route Frequency Provider Last Rate Last Dose  . ipratropium-albuterol (DUONEB) 0.5-2.5 (3) MG/3ML nebulizer solution 3 mL  3 mL Nebulization Once Janith Lima, MD        ROS Review of Systems  Constitutional: Positive for fatigue. Negative for appetite change and unexpected weight  change.  HENT: Negative for congestion, nosebleeds, sneezing, sore throat and trouble swallowing.   Eyes: Negative for itching and visual disturbance.  Respiratory: Negative for cough.   Cardiovascular: Negative for chest pain, palpitations and leg swelling.  Gastrointestinal: Negative for abdominal distention, blood in stool, diarrhea and nausea.  Genitourinary: Negative for frequency and hematuria.  Musculoskeletal: Positive for back pain. Negative for gait problem, joint swelling and neck pain.  Skin: Negative for rash.  Neurological: Positive for weakness. Negative for dizziness, tremors and speech difficulty.  Psychiatric/Behavioral: Negative for agitation, dysphoric mood and sleep disturbance. The patient is not nervous/anxious.     Objective:  BP (!) 176/72 (BP Location: Left Arm, Patient Position: Sitting, Cuff Size: Large)   Pulse 88   Temp 98 F (36.7 C) (Oral)   Ht 5\' 5"  (1.651 m)   Wt 205 lb (93 kg)   SpO2 98%   BMI 34.11 kg/m   BP Readings from Last 3 Encounters:  12/20/17 (!) 176/72  12/08/17 131/66  09/22/17 (!) 140/50    Wt Readings from Last 3 Encounters:  12/20/17 205 lb (93 kg)  12/07/17 201 lb (91.2 kg)  09/22/17 198 lb 6.4 oz (90 kg)    Physical Exam  Constitutional: He is oriented to person, place, and time. He appears well-developed. No distress.  NAD  HENT:  Mouth/Throat: Oropharynx is clear and moist.  Eyes: Conjunctivae are normal. Pupils are equal, round, and reactive to light.  Neck: Normal range of motion. No JVD present. No thyromegaly present.  Cardiovascular: Normal rate, regular rhythm, normal heart sounds and intact distal pulses. Exam reveals no gallop and no friction rub.  No murmur heard. Pulmonary/Chest: Effort normal and breath sounds normal. No respiratory distress. He has no wheezes. He has no rales. He exhibits no tenderness.  Abdominal: Soft. Bowel sounds are normal. He exhibits no distension and no mass. There is no tenderness.  There is no rebound and no guarding.  Musculoskeletal: Normal range of motion. He exhibits no edema or tenderness.  Lymphadenopathy:    He has no cervical adenopathy.  Neurological: He is alert and oriented to person, place, and time. He has normal reflexes. No cranial nerve deficit. He exhibits normal muscle tone. He displays a negative Romberg sign. Coordination and gait normal.  Skin: Skin is warm and dry. No rash noted.  Psychiatric: He has a normal mood and affect. His behavior is normal. Judgment and thought content normal.  purple face  Lab Results  Component Value Date   WBC 10.9 (H) 12/07/2017   HGB 18.1 (H) 12/07/2017   HCT 51.8 12/07/2017   PLT 212 12/07/2017   GLUCOSE 106 (H) 12/07/2017   CHOL 181 03/06/2017   TRIG 119.0 03/06/2017   HDL 50.00 03/06/2017   LDLDIRECT 138.6 11/05/2014   LDLCALC 107 (H) 03/06/2017   ALT 38 06/30/2017   AST 33 06/30/2017   NA 135 12/07/2017   K 3.6 12/07/2017   CL 99 (L) 12/07/2017   CREATININE 1.24 12/07/2017   BUN 23 (H) 12/07/2017   CO2 24 12/07/2017   TSH 3.33 06/30/2017   PSA 0.87 03/06/2017   INR 1.10 06/22/2015   HGBA1C 5.7 03/06/2017    Dg Chest 2 View  Result Date: 12/07/2017 CLINICAL DATA:  Hypertension, elevated heart rate.  Palpitations. EXAM: CHEST  2 VIEW COMPARISON:  Chest x-ray dated 12/02/2015. chest x-ray dated 06/29/2014. FINDINGS: Stable cardiomegaly. Overall cardiomediastinal silhouette is stable. Aortic atherosclerosis. Median sternotomy wires appear intact and stable in alignment. Lungs are clear. No pleural effusion or pneumothorax seen. No acute or suspicious osseous finding. IMPRESSION: No active cardiopulmonary disease. No evidence of pneumonia or pulmonary edema. Stable mild cardiomegaly. Aortic atherosclerosis. Electronically Signed   By: Franki Cabot M.D.   On: 12/07/2017 17:04    Assessment & Plan:   Diagnoses and all orders for this visit:  Localized edema  B12  deficiency  Polycythemia  Vitamin D deficiency  Hypertensive heart disease with congestive heart failure, unspecified heart failure type ([place])  Other orders -     triamterene-hydrochlorothiazide (MAXZIDE-25) 37.5-25 MG tablet; Take 2 tablets by mouth daily.   I have changed Gared R. Harriott's triamterene-hydrochlorothiazide. I am also having him maintain his vitamin B-12, aspirin EC, amLODipine, hydrALAZINE, OVER THE COUNTER MEDICATION, cholecalciferol, metroNIDAZOLE, clonazePAM, atorvastatin, sildenafil, and Carboxymethylcellulose Sod PF. We will continue to administer ipratropium-albuterol.  Meds ordered this encounter  Medications  . triamterene-hydrochlorothiazide (MAXZIDE-25) 37.5-25 MG tablet    Sig: Take 2 tablets by mouth daily.    Dispense:  60 tablet    Refill:  11     Follow-up: Return in about 3 months (around 03/20/2018) for a follow-up visit.  [name], MD

## 2017-12-20 NOTE — Assessment & Plan Note (Signed)
On Vit D 

## 2017-12-20 NOTE — Assessment & Plan Note (Signed)
Better  

## 2017-12-20 NOTE — Assessment & Plan Note (Signed)
On Hydralazine, Amlodipine, Maxzide - increase dose if needed to 2/d Phlebotomy should help

## 2017-12-28 ENCOUNTER — Encounter: Payer: Self-pay | Admitting: Physician Assistant

## 2017-12-29 ENCOUNTER — Ambulatory Visit: Payer: PPO | Admitting: Physician Assistant

## 2017-12-29 ENCOUNTER — Encounter: Payer: Self-pay | Admitting: Physician Assistant

## 2017-12-29 VITALS — BP 162/70 | HR 73 | Ht 64.0 in | Wt 196.8 lb

## 2017-12-29 DIAGNOSIS — R0602 Shortness of breath: Secondary | ICD-10-CM

## 2017-12-29 DIAGNOSIS — E785 Hyperlipidemia, unspecified: Secondary | ICD-10-CM

## 2017-12-29 DIAGNOSIS — I441 Atrioventricular block, second degree: Secondary | ICD-10-CM

## 2017-12-29 DIAGNOSIS — I2510 Atherosclerotic heart disease of native coronary artery without angina pectoris: Secondary | ICD-10-CM | POA: Diagnosis not present

## 2017-12-29 DIAGNOSIS — I739 Peripheral vascular disease, unspecified: Secondary | ICD-10-CM | POA: Diagnosis not present

## 2017-12-29 DIAGNOSIS — I10 Essential (primary) hypertension: Secondary | ICD-10-CM | POA: Diagnosis not present

## 2017-12-29 MED ORDER — AMLODIPINE BESYLATE 10 MG PO TABS: 10.0000 mg | ORAL_TABLET | Freq: Every day | ORAL | 3 refills | Status: DC

## 2017-12-29 MED ORDER — ASPIRIN EC 81 MG PO TBEC: 81.0000 mg | DELAYED_RELEASE_TABLET | Freq: Every day | ORAL | 3 refills | Status: DC

## 2017-12-29 NOTE — Patient Instructions (Addendum)
Your physician has recommended you make the following change in your medication:  INCREASE AMLODIPINE TO 10 MG EVERY DAY  DECREASE AS TO 81 MG  Your physician has recommended that you wear a holter monitor. Holter monitors are medical devices that record the heart's electrical activity. Doctors most often use these monitors to diagnose arrhythmias. Arrhythmias are problems with the speed or rhythm of the heartbeat. The monitor is a small, portable device. You can wear one while you do your normal daily activities. This is usually used to diagnose what is causing palpitations/syncope (passing out).  [place] has requested that you have en exercise stress myoview. For further information please visit [email or web address]. Please follow instruction sheet, as given. MAY CHANGE TO LEXISCAN IF UNABLE TO GET HEART RATE UP  Your physician recommends that you schedule a follow-up appointment in: 3 -[place] ON DAY DR Burt Knack IN OFFICE

## 2017-12-29 NOTE — Progress Notes (Signed)
Cardiology Office Note:    Date:  12/29/2017   ID:  [name], DOB 05-21-1951, MRN [id number]  PCP:  Cassandria Anger, MD  Cardiologist:  Sherren Mocha, MD  Electrophysiologist:  Dr. Thompson Grayer     Vascular surgeon: Dr. Trula Slade  Referring MD: Cassandria Anger, MD   Chief Complaint  Patient presents with  . Hospitalization Follow-up    ED visit 12/07/17    History of Present Illness:    Douglas Christensen is a 67 y.o. male with a hx of CAD s/p CABG in 04/2013, PAD s/p R SFA stent and L CIA stent in 2008, L EIA stent 2012 and L EIA/CFA/PFA/SFA endarterectomy and L SFA stent in 3/16, HTN, HL, OSA, GERD, prior ETOH abuse. He developed 2-1 heart block in August 2018 that resolved with discontinuation of his beta-blocker.  Last seen by Dr. Burt Knack 10/18.  He was seen in the emergency room 12/07/17 with palpitations and high blood pressure.  ECG demonstrated sinus rhythm with transient Mobitz 1 and prolonged PR interval similar to prior tracings.  Troponin was negative.  EKGs were reviewed by the on-call fellow and outpatient follow-up was recommended.  Mr. Berwick returns for follow-up.  He notes that his blood pressure was 220/100 and his heart rate was 126 when he went to the emergency room.  He can always tell when his blood pressure is increasing.  His blood pressure has run high recently.  He denies chest pain.  He notes dyspnea with exertion that is fairly chronic.  It seems to be worse over the past 6 months.  He does feel tired.  He also notes occasional dizziness.  He denies PND.  He has occasional left leg edema.  This is overall stable without significant change.  Prior CV studies:   The following studies were reviewed today:  Echo 07/2014 EF 55-60%, Gr 2 DD Mild BAE  LHC (6/14):  prox and mid LM 40-50%, prox LAD 70%, ostial Dx 75%, mid LAD 90-95%, prox CFX 60-70%, mid RCA occluded, EF 55-65% >>> CABG  Carotid US (1/14):  Bilateral ICA 1-39%  Carotid US (1/14):  Bilateral  ICA 1-39%  Past Medical History:  Diagnosis Date  . Anxiety   . Arthritis   . CAD (coronary artery disease)    Mild plaque (cath "years ago"); abnormal Myoview 04/2013 with subsequent CABG x 5 with LIMA to LAD, SVG to OM1, SVG to DX, SVG to PD & PL.   Marland Kitchen COPD (chronic obstructive pulmonary disease) ([place])   . GERD (gastroesophageal reflux disease)   . History of colonic polyps   . Hx of echocardiogram    Echo (9/15):  EF 55-60%; Gr 2 DD, mild BAE  . Hyperlipidemia   . Hypertension   . LBP (low back pain)   . Meralgia paresthetica of left side 2011  . PVD (peripheral vascular disease) ([place])    Stent to left common femoral and right superficial femoral.  2008.  50%  left renal   . Second degree AV block, Mobitz type I   . Shortness of breath    "once in awhile; can happen at anytime" (08/26/2013)  . Sleep apnea    mod OSA, central sleep apnea/hypoapnea syndrome 11/22/12, CPAP every night   . Vitamin D deficiency     Past Surgical History:  Procedure Laterality Date  . ABDOMINAL AORTAGRAM N/A 11/16/2011   Procedure: ABDOMINAL Maxcine Ham;  Surgeon: Sherren Mocha, MD;  Location: [facility] CATH LAB;  Service: Cardiovascular;  Laterality: N/A;  . ABDOMINAL AORTAGRAM N/A 11/14/2012   Procedure: ABDOMINAL Maxcine Ham;  Surgeon: Sherren Mocha, MD;  Location[facility] CATH LAB;  Service: Cardiovascular;  Laterality: N/A;  . ABDOMINAL AORTAGRAM N/A 12/30/2014   Procedure: ABDOMINAL Maxcine Ham;  Surgeon: Serafina Mitchell, MD;  Location: [facility] CATH LAB;  Service: Cardiovascular;  Laterality: N/A;  . CARDIAC CATHETERIZATION  05/02/13   x2   . CORONARY ARTERY BYPASS GRAFT N/A 05/06/2013   Procedure: CORONARY ARTERY BYPASS GRAFTING (CABG);  Surgeon: Melrose Nakayama, MD;  Location: [place];  Service: Open Heart Surgery;  Laterality: N/A;  Coronary artery bypass graft times five using left internal mammary artery and left greater saphenous vein via endovein harvest.  . ENDARTERECTOMY FEMORAL Left 01/29/2015   Procedure:  ENDARTERECTOMY FEMORAL WITH PATCH ANGIOPLASTY;  Surgeon: Serafina Mitchell, MD;  Location: [place];  Service: Vascular;  Laterality: Left;  . EYE SURGERY  03/24/16   cataract surgery on left eye  . FEMORAL-POPLITEAL BYPASS GRAFT  12/27/2012   Procedure: BYPASS GRAFT FEMORAL-POPLITEAL ARTERY;  Surgeon: Serafina Mitchell, MD;  Location: [facility] OR;  Service: Vascular;  Laterality: Right;  using non-reversed sapphenous vein.  Marland Kitchen ILIAC ATHERECTOMY Left 01/29/2015   Procedure: SUPERFICIAL FEMORAL ARTERY ATHERECTOMY/PERCUTANEOUS TRANSLUMINAL ANGIOPLASTY; superficial femoral artery stent;  Surgeon: Serafina Mitchell, MD;  Location: [place];  Service: Vascular;  Laterality: Left;  . LOWER EXTREMITY ANGIOGRAM Bilateral 11/16/2011   Procedure: LOWER EXTREMITY ANGIOGRAM;  Surgeon: Sherren Mocha, MD;  Location: [facility] CATH LAB;  Service: Cardiovascular;  Laterality: Bilateral;  . lower extremity stents     bilateral lower extremities x 2  . PERCUTANEOUS STENT INTERVENTION Left 11/16/2011   Procedure: PERCUTANEOUS STENT INTERVENTION;  Surgeon: Sherren Mocha, MD;  Location: [facility] CATH LAB;  Service: Cardiovascular;  Laterality: Left;  . TONSILLECTOMY    . TOTAL HIP ARTHROPLASTY Left 06/29/2015   Procedure: TOTAL HIP ARTHROPLASTY;  Surgeon: Frederik Pear, MD;  Location: [place];  Service: Orthopedics;  Laterality: Left;  LEFT TOTAL HIP ARTHROPLASTY DEPUY SROM/PINNACLE    Current Medications: Current Meds  Medication Sig  . atorvastatin (LIPITOR) 40 MG tablet Take 1 tablet (40 mg total) by mouth daily.  . Carboxymethylcellulose Sod PF (REFRESH CELLUVISC) 1 % GEL Place 1 drop into both eyes as needed (for dry eyes).  . cholecalciferol (VITAMIN D) 1000 units tablet Take 1,000 Units by mouth 2 (two) times daily.   . clonazePAM (KLONOPIN) 0.5 MG tablet Take 1 tablet (0.5 mg total) by mouth 2 (two) times daily as needed for anxiety.  . hydrALAZINE (APRESOLINE) 50 MG tablet TAKE 50 MG BY MOUTH THREE TIMES DAILY  . metroNIDAZOLE (METROCREAM)  0.75 % cream Apply topically 2 (two) times daily. Apply to face rash  . OVER THE COUNTER MEDICATION Take 1 capsule by mouth daily. "Nugenix" Natural Testosterone Booster  . sildenafil (REVATIO) 20 MG tablet Take 1-5 tablets (20-100 mg total) by mouth daily as needed (ED).  . triamterene-hydrochlorothiazide (MAXZIDE-25) 37.5-25 MG tablet Take 2 tablets by mouth daily.  . vitamin B-12 (CYANOCOBALAMIN) 1000 MCG tablet Take 1,000 mcg by mouth daily.  . [DISCONTINUED] amLODipine (NORVASC) 5 MG tablet Take 1 tablet (5 mg total) by mouth daily.  . [DISCONTINUED] aspirin EC 325 MG tablet Take 1 tablet (325 mg total) by mouth 2 (two) times daily.   Current Facility-Administered Medications for the 12/29/17 encounter (Office Visit) with Richardson Dopp T, PA-C  Medication  . ipratropium-albuterol (DUONEB) 0.5-2.5 (3) MG/3ML nebulizer solution 3 mL  Allergies:   Roxicodone [oxycodone hcl]; Doxycycline; Benazepril; Itraconazole; and Plavix [clopidogrel bisulfate]   Social History   Tobacco Use  . Smoking status: Former Smoker    Packs/day: 2.00    Years: 47.00    Pack years: 94.00    Types: Cigarettes    Last attempt to quit: 12/17/2012    Years since quitting: 5.0  . Smokeless tobacco: Never Used  Substance Use Topics  . Alcohol use: Yes    Alcohol/week: 2.4 oz    Types: 4 Shots of liquor per week    Comment: 08/26/2013 "3-4 mixed drinks/wk"  . Drug use: No     Family Hx: The patient's family history includes Alzheimer's disease in his father; Cancer in his father and mother; Heart disease in his mother; Hyperlipidemia in his mother; Hypertension in his mother. There is no history of Colon cancer or Heart attack.  ROS:   Please see the history of present illness.    Review of Systems  Constitution: Positive for malaise/fatigue.  Cardiovascular: Positive for dyspnea on exertion.  Neurological: Positive for loss of balance.   All other systems reviewed and are  negative.   EKGs/Labs/Other Test Reviewed:    EKG:  EKG is  ordered today.  The ekg ordered today demonstrates sinus rhythm, HR 73, PR interval 240, second-degree AV block type I, QTC 414, similar to prior tracings  Recent Labs: 06/30/2017: ALT 38; TSH 3.33 12/07/2017: BUN 23; Creatinine, Ser 1.24; Potassium 3.6; Sodium 135 12/20/2017: Hemoglobin 17.2; Platelets 225.0   Recent Lipid Panel Lab Results  Component Value Date/Time   CHOL 181 03/06/2017 12:33 PM   TRIG 119.0 03/06/2017 12:33 PM   TRIG 118 09/25/2006 10:02 AM   HDL 50.00 03/06/2017 12:33 PM   CHOLHDL 4 03/06/2017 12:33 PM   LDLCALC 107 (H) 03/06/2017 12:33 PM   LDLDIRECT 138.6 11/05/2014 08:11 AM    Physical Exam:    VS:  BP (!) 162/70   Pulse 73   Ht 5\' 4"  (1.626 m)   Wt 196 lb 12.8 oz (89.3 kg)   SpO2 95%   BMI 33.78 kg/m      Wt Readings from Last 3 Encounters:  12/29/17 196 lb 12.8 oz (89.3 kg)  12/20/17 205 lb (93 kg)  12/07/17 201 lb (91.2 kg)     Physical Exam  Constitutional: He is oriented to person, place, and time. He appears well-developed and well-nourished. No distress.  HENT:  Head: Normocephalic and atraumatic.  Neck: No JVD present.  Cardiovascular: Normal rate and regular rhythm.  No murmur heard. Pulmonary/Chest: Effort normal. He has no rales.  Abdominal: There is no tenderness.  Musculoskeletal: He exhibits no edema.  Neurological: He is alert and oriented to person, place, and time.  Skin: Skin is warm and dry.    ASSESSMENT:    1. Second degree Mobitz I AV block   2. Shortness of breath   3. Essential hypertension   4. Coronary artery disease involving native coronary artery of native heart without angina pectoris   5. Dyslipidemia   6. Peripheral vascular disease ([place])    PLAN:    In order of problems listed above:  1. Second degree Mobitz I AV block He recently went to the ED with elevated HR and blood pressure. He had high grade heart block that resolved off of  beta-blocker therapy last year.  He followed up with Dr. Rayann Heman and did not require pacer implant.  His rhythm looks stable on ECG today and  when he was in the ED.  He feels fatigued and has frequent dizziness. He denies syncope.  -Obtain 64 Hr Holter to rule out arrhythmias   -As noted below, he will undergo stress testing.  Plan exercise test to r/o chronotropic incompetence.  2. Shortness of breath He does have a long smoking history.  He probably has some element of COPD.  He tells me that his symptom prior to bypass was shortness of breath.  He had no chest pain.  He has not had an assessment for ischemia since his bypass.  -Arrange exercise Myoview  3. Essential hypertension Blood pressure uncontrolled.  Increase amlodipine to 10 mg daily.  4. Coronary artery disease  Status post bypass in 2014.  As noted, he feels that his chronic dyspnea has gotten worse recently.  Obtain exercise Myoview as noted.  Continue aspirin, statin.  He is on aspirin 325 twice daily for unclear reasons.  He can change this to 81 mg daily.  5. Dyslipidemia His PCP recently placed his statin on hold to see if it would improve his symptoms.  6. Peripheral vascular disease ([place]) Follow-up with vascular surgery as planned.  Dispo:  Return in about 4 weeks (around 01/26/2018) for Close Follow Up, w/ Dr. Burt Knack, or Richardson Dopp, PA-C.   Medication Adjustments/Labs and Tests Ordered: Current medicines are reviewed at length with the patient today.  Concerns regarding medicines are outlined above.  Tests Ordered: Orders Placed This Encounter  Procedures  . Holter monitor - 48 hour  . Myocardial Perfusion Imaging  . EKG 12-Lead   Medication Changes: Meds ordered this encounter  Medications  . aspirin EC 81 MG tablet    Sig: Take 1 tablet (81 mg total) by mouth daily.    Dispense:  90 tablet    Refill:  3    Order Specific Question:   Supervising Provider    Answer:   Sueanne Margarita [id number]  . amLODipine  (NORVASC) 10 MG tablet    Sig: Take 1 tablet (10 mg total) by mouth daily.    Dispense:  90 tablet    Refill:  3    Signed, Richardson Dopp, PA-C  12/29/2017 1:23 PM    [facility] [address], [place], [place]  [address] Phone: [phone]; Fax: [phone]

## 2018-01-02 ENCOUNTER — Encounter: Payer: Self-pay | Admitting: Gastroenterology

## 2018-01-08 ENCOUNTER — Telehealth (HOSPITAL_COMMUNITY): Payer: Self-pay | Admitting: *Deleted

## 2018-01-08 NOTE — Telephone Encounter (Signed)
Left message on voicemail per DPR in reference to upcoming appointment scheduled on 02/13/119 at 0945 with detailed instructions given per Myocardial Perfusion Study Information Sheet for the test. LM to arrive 15 minutes early, and that it is imperative to arrive on time for appointment to keep from having the test rescheduled. If you need to cancel or reschedule your appointment, please call the office within 24 hours of your appointment. Failure to do so may result in a cancellation of your appointment, and a $50 no show fee. Phone number given for call back for any questions.

## 2018-01-09 ENCOUNTER — Other Ambulatory Visit: Payer: Self-pay | Admitting: Physician Assistant

## 2018-01-09 DIAGNOSIS — I441 Atrioventricular block, second degree: Secondary | ICD-10-CM

## 2018-01-09 DIAGNOSIS — R42 Dizziness and giddiness: Secondary | ICD-10-CM

## 2018-01-10 ENCOUNTER — Ambulatory Visit (HOSPITAL_COMMUNITY): Payer: PPO | Attending: Cardiovascular Disease

## 2018-01-10 ENCOUNTER — Ambulatory Visit (INDEPENDENT_AMBULATORY_CARE_PROVIDER_SITE_OTHER): Payer: PPO

## 2018-01-10 DIAGNOSIS — I998 Other disorder of circulatory system: Secondary | ICD-10-CM | POA: Diagnosis not present

## 2018-01-10 DIAGNOSIS — R9439 Abnormal result of other cardiovascular function study: Secondary | ICD-10-CM | POA: Insufficient documentation

## 2018-01-10 DIAGNOSIS — R0602 Shortness of breath: Secondary | ICD-10-CM | POA: Diagnosis not present

## 2018-01-10 DIAGNOSIS — I441 Atrioventricular block, second degree: Secondary | ICD-10-CM

## 2018-01-10 DIAGNOSIS — R42 Dizziness and giddiness: Secondary | ICD-10-CM | POA: Diagnosis not present

## 2018-01-10 LAB — MYOCARDIAL PERFUSION IMAGING
CHL CUP MPHR: 154 {beats}/min
CHL CUP NUCLEAR SSS: 4
CSEPED: 5 min
CSEPEDS: 50 s
CSEPEW: 7 METS
CSEPHR: 69 %
CSEPPHR: 106 {beats}/min
LV sys vol: 1 mL
RATE: 0.41
RPE: 18
Rest HR: 75 {beats}/min
SDS: 3
SRS: 1

## 2018-01-10 MED ORDER — TECHNETIUM TC 99M TETROFOSMIN IV KIT
32.2000 | PACK | Freq: Once | INTRAVENOUS | Status: AC | PRN
  Administered 2018-01-10: 32.2 via INTRAVENOUS
  Filled 2018-01-10: qty 33

## 2018-01-10 MED ORDER — TECHNETIUM TC 99M TETROFOSMIN IV KIT
10.2000 | PACK | Freq: Once | INTRAVENOUS | Status: AC | PRN
  Administered 2018-01-10: 10.2 via INTRAVENOUS
  Filled 2018-01-10: qty 11

## 2018-01-10 MED ORDER — REGADENOSON 0.4 MG/5ML IV SOLN
0.4000 mg | Freq: Once | INTRAVENOUS | Status: AC
  Administered 2018-01-10: 0.4 mg via INTRAVENOUS

## 2018-01-12 ENCOUNTER — Telehealth: Payer: Self-pay

## 2018-01-12 ENCOUNTER — Encounter: Payer: Self-pay | Admitting: Physician Assistant

## 2018-01-12 DIAGNOSIS — I110 Hypertensive heart disease with heart failure: Secondary | ICD-10-CM

## 2018-01-12 DIAGNOSIS — I440 Atrioventricular block, first degree: Secondary | ICD-10-CM

## 2018-01-12 NOTE — Addendum Note (Signed)
Addended by: Aris Georgia, Josephmichael Lisenbee L on: 01/12/2018 02:16 PM   Modules accepted: Orders

## 2018-01-12 NOTE — Telephone Encounter (Signed)
Patient aware of myoview results. Per Douglas Dopp PA, The Nuclear stress test shows what looks like interference from the gut. I did review with Dr. Sherren Mocha. We would like him to get an echocardiogram to look closer at his heart function and wall motion. Please arrange an echocardiogram. Patient will call back for a time to schedule echo. Will await patient to call back. Please route a copy of this study result to his PCP: Plotnikov, Evie Lacks, MD

## 2018-01-12 NOTE — Telephone Encounter (Signed)
-----   Message from Liliane Shi, [place] sent at 01/12/2018  1:46 PM EST ----- Please call the patient. The Nuclear stress test shows what looks like interference from the gut.  I did review with Dr. Sherren Mocha.  We would like him to get an echocardiogram to look closer at his heart function and wall motion. Please arrange an echocardiogram  Please route a copy of this study result to his PCP:  Plotnikov, Evie Lacks, MD  Richardson Dopp, PA-C    01/12/2018 1:40 PM

## 2018-01-15 ENCOUNTER — Ambulatory Visit (INDEPENDENT_AMBULATORY_CARE_PROVIDER_SITE_OTHER): Payer: PPO | Admitting: Internal Medicine

## 2018-01-15 ENCOUNTER — Encounter: Payer: Self-pay | Admitting: Internal Medicine

## 2018-01-15 VITALS — BP 138/72 | HR 69 | Temp 98.8°F | Ht 64.0 in | Wt 200.0 lb

## 2018-01-15 DIAGNOSIS — G4733 Obstructive sleep apnea (adult) (pediatric): Secondary | ICD-10-CM | POA: Diagnosis not present

## 2018-01-15 DIAGNOSIS — Z9989 Dependence on other enabling machines and devices: Secondary | ICD-10-CM

## 2018-01-15 DIAGNOSIS — J441 Chronic obstructive pulmonary disease with (acute) exacerbation: Secondary | ICD-10-CM

## 2018-01-15 DIAGNOSIS — R52 Pain, unspecified: Secondary | ICD-10-CM

## 2018-01-15 DIAGNOSIS — L719 Rosacea, unspecified: Secondary | ICD-10-CM | POA: Diagnosis not present

## 2018-01-15 DIAGNOSIS — J209 Acute bronchitis, unspecified: Secondary | ICD-10-CM | POA: Diagnosis not present

## 2018-01-15 LAB — POC INFLUENZA A&B (BINAX/QUICKVUE)
INFLUENZA A, POC: NEGATIVE
Influenza B, POC: NEGATIVE

## 2018-01-15 MED ORDER — METHYLPREDNISOLONE ACETATE 80 MG/ML IJ SUSP
80.0000 mg | Freq: Once | INTRAMUSCULAR | Status: AC
  Administered 2018-01-15: 80 mg via INTRAMUSCULAR

## 2018-01-15 MED ORDER — AZITHROMYCIN 250 MG PO TABS: ORAL_TABLET | ORAL | 0 refills | Status: DC

## 2018-01-15 MED ORDER — DOXYCYCLINE HYCLATE 100 MG PO TABS: 100.0000 mg | ORAL_TABLET | Freq: Every day | ORAL | 3 refills | Status: DC

## 2018-01-15 NOTE — Patient Instructions (Addendum)
You can use over-the-counter  "cold" medicines  such as "Afrin" nasal spray for nasal congestion as directed. Use " Delsym" or" Robitussin" cough syrup varietis for cough.  You can use plain "Tylenol" or "Advil" for fever, chills and achyness. Use Halls or Ricola cough drops.   "Common cold" symptoms are usually triggered by a virus.  The antibiotics are usually not necessary. On average a " viral cold" illness would take 4-7 days to resolve.   Please, make an appointment if you are not better or if you're worse.

## 2018-01-15 NOTE — Assessment & Plan Note (Signed)
Zpac 

## 2018-01-15 NOTE — Assessment & Plan Note (Signed)
Doxy Rx: he is able to tolerate 1 a day Not to take w/Zpac

## 2018-01-15 NOTE — Progress Notes (Signed)
Subjective:  Patient ID: Douglas Christensen, male    DOB: 10/13/1951  Age: 67 y.o. MRN: [id number]  CC: No chief complaint on file.   HPI Douglas Christensen presents for URI x 4 d, getting worse Cough w/clear sputum F/u Rosacea - worse   Outpatient Medications Prior to Visit  Medication Sig Dispense Refill  . amLODipine (NORVASC) 10 MG tablet Take 1 tablet (10 mg total) by mouth daily. 90 tablet 3  . aspirin EC 81 MG tablet Take 1 tablet (81 mg total) by mouth daily. 90 tablet 3  . atorvastatin (LIPITOR) 40 MG tablet Take 1 tablet (40 mg total) by mouth daily. 90 tablet 3  . Carboxymethylcellulose Sod PF (REFRESH CELLUVISC) 1 % GEL Place 1 drop into both eyes as needed (for dry eyes).    . cholecalciferol (VITAMIN D) 1000 units tablet Take 1,000 Units by mouth 2 (two) times daily.     . clonazePAM (KLONOPIN) 0.5 MG tablet Take 1 tablet (0.5 mg total) by mouth 2 (two) times daily as needed for anxiety. 60 tablet 2  . hydrALAZINE (APRESOLINE) 50 MG tablet TAKE 50 MG BY MOUTH THREE TIMES DAILY 270 tablet 3  . metroNIDAZOLE (METROCREAM) 0.75 % cream Apply topically 2 (two) times daily. Apply to face rash 45 g 3  . OVER THE COUNTER MEDICATION Take 1 capsule by mouth daily. "Nugenix" Natural Testosterone Booster    . sildenafil (REVATIO) 20 MG tablet Take 1-5 tablets (20-100 mg total) by mouth daily as needed (ED). 50 tablet 4  . triamterene-hydrochlorothiazide (MAXZIDE-25) 37.5-25 MG tablet Take 2 tablets by mouth daily. 60 tablet 11  . vitamin B-12 (CYANOCOBALAMIN) 1000 MCG tablet Take 1,000 mcg by mouth daily.     Facility-Administered Medications Prior to Visit  Medication Dose Route Frequency Provider Last Rate Last Dose  . ipratropium-albuterol (DUONEB) 0.5-2.5 (3) MG/3ML nebulizer solution 3 mL  3 mL Nebulization Once Janith Lima, MD        ROS Review of Systems  Constitutional: Positive for chills. Negative for appetite change, fatigue and unexpected weight change.  HENT: Positive for  congestion, postnasal drip, sinus pain and sore throat. Negative for nosebleeds, sneezing and trouble swallowing.   Eyes: Negative for itching and visual disturbance.  Respiratory: Positive for cough.   Cardiovascular: Negative for chest pain, palpitations and leg swelling.  Gastrointestinal: Negative for abdominal distention, blood in stool, diarrhea and nausea.  Genitourinary: Negative for frequency and hematuria.  Musculoskeletal: Positive for arthralgias and myalgias. Negative for back pain, gait problem, joint swelling and neck pain.  Skin: Negative for rash.  Neurological: Negative for dizziness, tremors, speech difficulty and weakness.  Psychiatric/Behavioral: Negative for agitation, dysphoric mood and sleep disturbance. The patient is not nervous/anxious.     Objective:  BP 138/72 (BP Location: Right Arm, Patient Position: Sitting, Cuff Size: Large)   Pulse 69   Temp 98.8 F (37.1 C) (Oral)   Ht 5\' 4"  (1.626 m)   Wt 200 lb (90.7 kg)   SpO2 97%   BMI 34.33 kg/m   BP Readings from Last 3 Encounters:  01/15/18 138/72  12/29/17 (!) 162/70  12/20/17 (!) 176/72    Wt Readings from Last 3 Encounters:  01/15/18 200 lb (90.7 kg)  01/10/18 196 lb (88.9 kg)  12/29/17 196 lb 12.8 oz (89.3 kg)    Physical Exam  Constitutional: He is oriented to person, place, and time. He appears well-developed. No distress.  NAD  HENT:  Mouth/Throat: Oropharynx is clear  and moist.  Eyes: Conjunctivae are normal. Pupils are equal, round, and reactive to light.  Neck: Normal range of motion. No JVD present. No thyromegaly present.  Cardiovascular: Normal rate, regular rhythm, normal heart sounds and intact distal pulses. Exam reveals no gallop and no friction rub.  No murmur heard. Pulmonary/Chest: Effort normal and breath sounds normal. No respiratory distress. He has no wheezes. He has no rales. He exhibits no tenderness.  Abdominal: Soft. Bowel sounds are normal. He exhibits no distension  and no mass. There is no tenderness. There is no rebound and no guarding.  Musculoskeletal: Normal range of motion. He exhibits no edema or tenderness.  Lymphadenopathy:    He has no cervical adenopathy.  Neurological: He is alert and oriented to person, place, and time. He has normal reflexes. No cranial nerve deficit. He exhibits normal muscle tone. He displays a negative Romberg sign. Coordination and gait normal.  Skin: Skin is warm and dry. No rash noted.  Psychiatric: He has a normal mood and affect. His behavior is normal. Judgment and thought content normal.  eryth throat  Lab Results  Component Value Date   WBC 7.5 12/20/2017   HGB 17.2 (H) 12/20/2017   HCT 49.4 12/20/2017   PLT 225.0 12/20/2017   GLUCOSE 106 (H) 12/07/2017   CHOL 181 03/06/2017   TRIG 119.0 03/06/2017   HDL 50.00 03/06/2017   LDLDIRECT 138.6 11/05/2014   LDLCALC 107 (H) 03/06/2017   ALT 38 06/30/2017   AST 33 06/30/2017   NA 135 12/07/2017   K 3.6 12/07/2017   CL 99 (L) 12/07/2017   CREATININE 1.24 12/07/2017   BUN 23 (H) 12/07/2017   CO2 24 12/07/2017   TSH 3.33 06/30/2017   PSA 0.87 03/06/2017   INR 1.10 06/22/2015   HGBA1C 5.7 03/06/2017    Dg Chest 2 View  Result Date: 12/07/2017 CLINICAL DATA:  Hypertension, elevated heart rate.  Palpitations. EXAM: CHEST  2 VIEW COMPARISON:  Chest x-ray dated 12/02/2015. chest x-ray dated 06/29/2014. FINDINGS: Stable cardiomegaly. Overall cardiomediastinal silhouette is stable. Aortic atherosclerosis. Median sternotomy wires appear intact and stable in alignment. Lungs are clear. No pleural effusion or pneumothorax seen. No acute or suspicious osseous finding. IMPRESSION: No active cardiopulmonary disease. No evidence of pneumonia or pulmonary edema. Stable mild cardiomegaly. Aortic atherosclerosis. Electronically Signed   By: Franki Cabot M.D.   On: 12/07/2017 17:04    Assessment & Plan:   Diagnoses and all orders for this visit:  Body aches -     POC  Influenza A&B (Binax test)   I am having Douglas Christensen maintain his vitamin B-12, hydrALAZINE, OVER THE COUNTER MEDICATION, cholecalciferol, metroNIDAZOLE, clonazePAM, atorvastatin, sildenafil, Carboxymethylcellulose Sod PF, triamterene-hydrochlorothiazide, aspirin EC, and amLODipine. We will continue to administer ipratropium-albuterol.  No orders of the defined types were placed in this encounter.    Follow-up: No Follow-up on file.  [name], MD

## 2018-01-15 NOTE — Assessment & Plan Note (Signed)
Depo-medrol 80 mg IM 

## 2018-01-15 NOTE — Assessment & Plan Note (Signed)
CPAP.  

## 2018-01-22 ENCOUNTER — Ambulatory Visit (HOSPITAL_COMMUNITY): Payer: PPO | Attending: Cardiovascular Disease

## 2018-01-22 ENCOUNTER — Telehealth: Payer: Self-pay | Admitting: *Deleted

## 2018-01-22 ENCOUNTER — Other Ambulatory Visit: Payer: Self-pay

## 2018-01-22 ENCOUNTER — Encounter: Payer: Self-pay | Admitting: Physician Assistant

## 2018-01-22 DIAGNOSIS — I739 Peripheral vascular disease, unspecified: Secondary | ICD-10-CM | POA: Diagnosis not present

## 2018-01-22 DIAGNOSIS — F419 Anxiety disorder, unspecified: Secondary | ICD-10-CM | POA: Insufficient documentation

## 2018-01-22 DIAGNOSIS — Z6834 Body mass index (BMI) 34.0-34.9, adult: Secondary | ICD-10-CM | POA: Diagnosis not present

## 2018-01-22 DIAGNOSIS — I509 Heart failure, unspecified: Secondary | ICD-10-CM | POA: Diagnosis not present

## 2018-01-22 DIAGNOSIS — E785 Hyperlipidemia, unspecified: Secondary | ICD-10-CM | POA: Insufficient documentation

## 2018-01-22 DIAGNOSIS — I2510 Atherosclerotic heart disease of native coronary artery without angina pectoris: Secondary | ICD-10-CM | POA: Insufficient documentation

## 2018-01-22 DIAGNOSIS — J449 Chronic obstructive pulmonary disease, unspecified: Secondary | ICD-10-CM | POA: Diagnosis not present

## 2018-01-22 DIAGNOSIS — E669 Obesity, unspecified: Secondary | ICD-10-CM | POA: Diagnosis not present

## 2018-01-22 DIAGNOSIS — I440 Atrioventricular block, first degree: Secondary | ICD-10-CM | POA: Insufficient documentation

## 2018-01-22 DIAGNOSIS — I110 Hypertensive heart disease with heart failure: Secondary | ICD-10-CM | POA: Insufficient documentation

## 2018-01-22 DIAGNOSIS — G4730 Sleep apnea, unspecified: Secondary | ICD-10-CM | POA: Diagnosis not present

## 2018-01-22 NOTE — Telephone Encounter (Signed)
Left message to go over monitor results

## 2018-01-22 NOTE — Telephone Encounter (Signed)
-----   Message from Liliane Shi, [place] sent at 01/22/2018  3:04 PM EST ----- Please call the patient. Holter monitor demonstrates normal rhythm (sinus).  There were frequent PVCs but only 5% of total beats.  There was some slowing between upper and lower chambers but no significant change.  This was reviewed by Dr. Burt Knack who felt that continued clinical management is appropriate. Continue current medications and follow up as planned.  Please fax a copy of this study result to his PCP:  Plotnikov, Evie Lacks, MD  Thanks! Richardson Dopp, PA-C    01/22/2018 2:57 PM

## 2018-01-23 ENCOUNTER — Encounter: Payer: Self-pay | Admitting: Physician Assistant

## 2018-01-24 ENCOUNTER — Encounter: Payer: Self-pay | Admitting: Physician Assistant

## 2018-01-24 ENCOUNTER — Ambulatory Visit: Payer: PPO | Admitting: Physician Assistant

## 2018-01-24 VITALS — BP 146/58 | HR 60 | Ht 64.0 in | Wt 200.0 lb

## 2018-01-24 DIAGNOSIS — I441 Atrioventricular block, second degree: Secondary | ICD-10-CM

## 2018-01-24 DIAGNOSIS — E785 Hyperlipidemia, unspecified: Secondary | ICD-10-CM | POA: Diagnosis not present

## 2018-01-24 DIAGNOSIS — I2510 Atherosclerotic heart disease of native coronary artery without angina pectoris: Secondary | ICD-10-CM

## 2018-01-24 DIAGNOSIS — I10 Essential (primary) hypertension: Secondary | ICD-10-CM | POA: Diagnosis not present

## 2018-01-24 NOTE — Patient Instructions (Addendum)
Medication Instructions:  No changes.  Make sure you get your Maxzide refilled and restart it.  This will help your blood pressure and swelling.   Labwork: 1 week before your appointment with Dr. Sherren Mocha in 6 months - CMET, fasting Lipids  Testing/Procedures: None   Follow-Up: Your physician wants you to follow-up in: [place] DR. [name] will receive a reminder letter in the mail two months in advance. If you don't receive a letter, please call our office to schedule the follow-up appointment.   Any Other Special Instructions Will Be Listed Below (If Applicable). Check your blood pressure daily and call me if it is consistently greater than 130/80.  If you need a refill on your cardiac medications before your next appointment, please call your pharmacy.

## 2018-01-24 NOTE — Progress Notes (Signed)
Cardiology Office Note:    Date:  01/24/2018   ID:  Douglas Christensen, DOB Jan 16, 1951, MRN [id number]  PCP:  Cassandria Anger, MD  Cardiologist:  Sherren Mocha, MD   Referring MD: Cassandria Anger, MD   Chief Complaint  Patient presents with  . Follow-up    BP, CAD    History of Present Illness:    Douglas Christensen is a 67 y.o. male with CAD s/p CABG in 04/2013, PAD s/p R SFA stent and L CIA stent in 2008, L EIA stent 2012 and L EIA/CFA/PFA/SFA endarterectomy and L SFA stent in 3/16, HTN, HL, OSA, GERD, prior ETOH abuse. He developed 2-1 heart block in August 2018 that resolved with discontinuation of his beta-blocker.  He was seen in the emergency room January 2000 with palpitations and high blood pressure.  Electrocardiogram demonstrated sinus rhythm with transient Mobitz 1 and prolonged PR interval similar to prior ECGs.  He was last seen in the office by me 12/29/17.  He did note progressively worsening shortness of breath.  I set him up for a 48-hour Holter and nuclear stress test.  Stress testing demonstrated inferior, inferolateral and apical inferior defect that appeared suspicious for attenuation.  A follow-up echo demonstrated normal LV function with normal wall motion.  There was no significant ischemia.  The Holter monitor demonstrated sinus rhythm with an average heart rate of 72, 5% PVCs and periods of second-degree AV block type I as well as 2:1 heart block.  Clinical follow-up was recommended.  Mr. Spieler returns for follow-up.  He is here alone.  Since last seen, he continues to note ecchymosis about his upper extremities.  He has chronic shortness of breath without significant change.  He denies orthopnea or PND.  He has noted left leg swelling.  This occurs from time to time.  It typically resolves after a day of wearing compression stockings.  It is still somewhat swollen today.  He denies chest discomfort or syncope.  He denies any other bleeding issues.  Prior CV studies:   The  following studies were reviewed today:  Holter 01/10/18 1.  The basic rhythm is normal sinus with an average heart rate of 72 bpm 2.  There are frequent PVCs (5% burden) 3.  There is second-degree type I AV block and periods of 2: 1 heart block  Echo 01/22/18 Mild LVH, EF 83-38, grade 2 diastolic dysfunction, mild RAE  Nuclear stress test 01/10/18 Inferior/inferolateral/apical inferior defect with minimal reversibility and anteroseptal wall-likely diaphragmatic attenuation; cannot rule out infarct with peri-infarct ischemia; sinus rhythm with PVCs, PACs and intermittent type I second-degree AV block which improved with increased heart rate; intermediate risk  Echo 07/2014 EF 55-60%, Gr 2 DD Mild BAE  LHC (6/14):  prox and mid LM 40-50%, prox LAD 70%, ostial Dx 75%, mid LAD 90-95%, prox CFX 60-70%, mid RCA occluded, EF 55-65% >>> CABG  Carotid US (1/14):  Bilateral ICA 1-39%  Carotid US (1/14):  Bilateral ICA 1-39%  Past Medical History:  Diagnosis Date  . Anxiety   . Arthritis   . CAD (coronary artery disease)    Mild plaque (cath "years ago"); abnormal Myoview 04/2013 with subsequent CABG x 5 with LIMA to LAD, SVG to OM1, SVG to DX, SVG to PD & PL.   Marland Kitchen COPD (chronic obstructive pulmonary disease) ([place])   . GERD (gastroesophageal reflux disease)   . History of colonic polyps   . History of echocardiogram    Echo 2/19:  Mild LVH, EF 60-65, normal wall motion, grade 2 diastolic dysfunction, mild RAE  . History of nuclear stress test    Myoview 2/19: inf/inf-lat/apical inf/ant-sept defect (?diaph atten - cannot rule out peri-infarct ischemia), PVCs/PACs/Mobitz 1  . Hx of echocardiogram    Echo (9/15):  EF 55-60%; Gr 2 DD, mild BAE  . Hyperlipidemia   . Hypertension   . LBP (low back pain)   . Meralgia paresthetica of left side 2011  . PVD (peripheral vascular disease) ([place])    Stent to left common femoral and right superficial femoral.  2008.  50%  left renal   . Second  degree AV block, Mobitz type I    Holter 2/19: Sinus rhythm, average heart rate 72, frequent PVCs (burden 5%), second-degree type I AV block and periods of 2:1 heart block >> continue clinical managment and avoid AVN blocking agents  . Shortness of breath    "once in awhile; can happen at anytime" (08/26/2013)  . Sleep apnea    mod OSA, central sleep apnea/hypoapnea syndrome 11/22/12, CPAP every night   . Vitamin D deficiency     Past Surgical History:  Procedure Laterality Date  . ABDOMINAL AORTAGRAM N/A 11/16/2011   Procedure: ABDOMINAL Maxcine Ham;  Surgeon: Sherren Mocha, MD;  Location[facility] C[facility] LAB;  Service: Cardiovascular;  Laterality: N/A;  . ABDOMINAL AORTAGRAM N/A 11/14/2012   Procedure: ABDOMINAL Maxcine Ham;  Surgeon: Sherren Mocha, MD;  Location: [facility] CATH LAB;  Service: Cardiovascular;  Laterality: N/A;  . ABDOMINAL AORTAGRAM N/A 12/30/2014   Procedure: ABDOMINAL Maxcine Ham;  Surgeon: Serafina Mitchell, MD;  Location: [facility] CATH LAB;  Service: Cardiovascular;  Laterality: N/A;  . CARDIAC CATHETERIZATION  05/02/13   x2   . CORONARY ARTERY BYPASS GRAFT N/A 05/06/2013   Procedure: CORONARY ARTERY BYPASS GRAFTING (CABG);  Surgeon: Melrose Nakayama, MD;  Location: [place];  Service: Open Heart Surgery;  Laterality: N/A;  Coronary artery bypass graft times five using left internal mammary artery and left greater saphenous vein via endovein harvest.  . ENDARTERECTOMY FEMORAL Left 01/29/2015   Procedure: ENDARTERECTOMY FEMORAL WITH PATCH ANGIOPLASTY;  Surgeon: Serafina Mitchell, MD;  Location: [place];  Service: Vascular;  Laterality: Left;  . EYE SURGERY  03/24/16   cataract surgery on left eye  . FEMORAL-POPLITEAL BYPASS GRAFT  12/27/2012   Procedure: BYPASS GRAFT FEMORAL-POPLITEAL ARTERY;  Surgeon: Serafina Mitchell, MD;  Location: [facility] OR;  Service: Vascular;  Laterality: Right;  using non-reversed sapphenous vein.  Marland Kitchen ILIAC ATHERECTOMY Left 01/29/2015   Procedure: SUPERFICIAL FEMORAL ARTERY  ATHERECTOMY/PERCUTANEOUS TRANSLUMINAL ANGIOPLASTY; superficial femoral artery stent;  Surgeon: Serafina Mitchell, MD;  Location: [place];  Service: Vascular;  Laterality: Left;  . LOWER EXTREMITY ANGIOGRAM Bilateral 11/16/2011   Procedure: LOWER EXTREMITY ANGIOGRAM;  Surgeon: Sherren Mocha, MD;  Location: [facility] CATH LAB;  Service: Cardiovascular;  Laterality: Bilateral;  . lower extremity stents     bilateral lower extremities x 2  . PERCUTANEOUS STENT INTERVENTION Left 11/16/2011   Procedure: PERCUTANEOUS STENT INTERVENTION;  Surgeon: Sherren Mocha, MD;  Location: [facility] CATH LAB;  Service: Cardiovascular;  Laterality: Left;  . TONSILLECTOMY    . TOTAL HIP ARTHROPLASTY Left 06/29/2015   Procedure: TOTAL HIP ARTHROPLASTY;  Surgeon: Frederik Pear, MD;  Location: [place];  Service: Orthopedics;  Laterality: Left;  LEFT TOTAL HIP ARTHROPLASTY DEPUY SROM/PINNACLE    Current Medications: Current Meds  Medication Sig  . amLODipine (NORVASC) 10 MG tablet Take 1 tablet (10 mg total) by mouth daily.  Marland Kitchen  aspirin EC 81 MG tablet Take 1 tablet (81 mg total) by mouth daily.  Marland Kitchen atorvastatin (LIPITOR) 40 MG tablet Take 1 tablet (40 mg total) by mouth daily.  Marland Kitchen azithromycin (ZITHROMAX Z-PAK) 250 MG tablet As directed  . Carboxymethylcellulose Sod PF (REFRESH CELLUVISC) 1 % GEL Place 1 drop into both eyes as needed (for dry eyes).  . cholecalciferol (VITAMIN D) 1000 units tablet Take 1,000 Units by mouth 2 (two) times daily.   . clonazePAM (KLONOPIN) 0.5 MG tablet Take 1 tablet (0.5 mg total) by mouth 2 (two) times daily as needed for anxiety.  Marland Kitchen doxycycline (VIBRA-TABS) 100 MG tablet Take 1 tablet (100 mg total) by mouth daily. For acne  . hydrALAZINE (APRESOLINE) 50 MG tablet TAKE 50 MG BY MOUTH THREE TIMES DAILY  . metroNIDAZOLE (METROCREAM) 0.75 % cream Apply topically 2 (two) times daily. Apply to face rash  . OVER THE COUNTER MEDICATION Take 1 capsule by mouth daily. "Nugenix" Natural Testosterone Booster  .  sildenafil (REVATIO) 20 MG tablet Take 1-5 tablets (20-100 mg total) by mouth daily as needed (ED).  . triamterene-hydrochlorothiazide (MAXZIDE-25) 37.5-25 MG tablet Take 2 tablets by mouth daily.  . vitamin B-12 (CYANOCOBALAMIN) 1000 MCG tablet Take 1,000 mcg by mouth daily.     Allergies:   Roxicodone [oxycodone hcl]; Doxycycline; Benazepril; Itraconazole; and Plavix [clopidogrel bisulfate]   Social History   Tobacco Use  . Smoking status: Former Smoker    Packs/day: 2.00    Years: 47.00    Pack years: 94.00    Types: Cigarettes    Last attempt to quit: 12/17/2012    Years since quitting: 5.1  . Smokeless tobacco: Never Used  Substance Use Topics  . Alcohol use: Yes    Alcohol/week: 2.4 oz    Types: 4 Shots of liquor per week    Comment: 08/26/2013 "3-4 mixed drinks/wk"  . Drug use: No     Family Hx: The patient's family history includes Alzheimer's disease in his father; Cancer in his father and mother; Heart disease in his mother; Hyperlipidemia in his mother; Hypertension in his mother. There is no history of Colon cancer or Heart attack.  ROS:   Please see the history of present illness.    ROS All other systems reviewed and are negative.   EKGs/Labs/Other Test Reviewed:    EKG:  EKG is  ordered today.  The ekg ordered today demonstrates sinus rhythm, heart rate 60, second-degree AV block type I  Recent Labs: 06/30/2017: ALT 38; TSH 3.33 12/07/2017: BUN 23; Creatinine, Ser 1.24; Potassium 3.6; Sodium 135 12/20/2017: Hemoglobin 17.2; Platelets 225.0   Recent Lipid Panel Lab Results  Component Value Date/Time   CHOL 181 03/06/2017 12:33 PM   TRIG 119.0 03/06/2017 12:33 PM   TRIG 118 09/25/2006 10:02 AM   HDL 50.00 03/06/2017 12:33 PM   CHOLHDL 4 03/06/2017 12:33 PM   LDLCALC 107 (H) 03/06/2017 12:33 PM   LDLDIRECT 138.6 11/05/2014 08:11 AM    Physical Exam:    VS:  BP (!) 146/58   Pulse 60   Ht 5' 4"  (1.626 m)   Wt 200 lb (90.7 kg)   SpO2 97%   BMI 34.33  kg/m     Wt Readings from Last 3 Encounters:  01/24/18 200 lb (90.7 kg)  01/15/18 200 lb (90.7 kg)  01/10/18 196 lb (88.9 kg)     Physical Exam  Constitutional: He is oriented to person, place, and time. He appears well-developed and well-nourished. No distress.  HENT:  Head: Normocephalic and atraumatic.  Neck: No JVD present.  Cardiovascular: Normal rate. A regularly irregular rhythm present.  No murmur heard. Pulmonary/Chest: Effort normal. He has no rales.  Abdominal: Soft.  Musculoskeletal: Edema: 1+ L leg edema.  Neurological: He is alert and oriented to person, place, and time.  Skin: Skin is warm and dry.    ASSESSMENT & PLAN:    1.  Coronary artery disease  Status post bypass in 2014.  Nuclear stress test 2/19 without significant ischemia.  EF normal by echocardiogram.  He denies anginal symptoms.  Continue aspirin, statin.  2.  Mobitz type 1 second degree atrioventricular block Recent Holter without evidence of high-grade heart block.  Continue clinical follow-up and avoid AV nodal blocking agents.  He denies syncope.  3.  Essential hypertension Blood pressure elevated today.  He notes that he has been out of Maxide for several days.  This likely explains the difficulty with swelling in his left leg.  I have asked him to resume Maxide and to keep an eye on his blood pressure over the next several days.  If it remains >130/80, and he will contact me for further adjustments in his medical therapy.  4.  Hyperlipidemia, unspecified hyperlipidemia type We will continue moderate intensity statin.  Obtain follow-up fasting CMET, lipids prior to next office visit.   Dispo:  Return in about 6 months (around 07/24/2018) for Routine Follow Up, w/ Dr. Burt Knack.   Medication Adjustments/Labs and Tests Ordered: Current medicines are reviewed at length with the patient today.  Concerns regarding medicines are outlined above.  Tests Ordered: Orders Placed This Encounter  Procedures   . Comp Met (CMET)  . Lipid Profile  . EKG 12-Lead   Medication Changes: No orders of the defined types were placed in this encounter.   Signed, Richardson Dopp, PA-C  01/24/2018 11:41 AM    [facility] [address], [place], [place]  [address] Phone: [phone]; Fax: [phone]

## 2018-02-09 ENCOUNTER — Telehealth: Payer: Self-pay | Admitting: Internal Medicine

## 2018-02-09 ENCOUNTER — Other Ambulatory Visit: Payer: Self-pay | Admitting: Family

## 2018-02-09 MED ORDER — HYDRALAZINE HCL 50 MG PO TABS: ORAL_TABLET | ORAL | 1 refills | Status: DC

## 2018-02-09 NOTE — Telephone Encounter (Signed)
Sent maintenance med. Check [place] registry last filled clonazepam 11/13/2017. pls advise if ok to refill.Marland KitchenJohny Chess

## 2018-02-09 NOTE — Telephone Encounter (Signed)
Copied from [place] [phone]. Topic: Quick Communication - Rx Refill/Question >> Feb 09, 2018 10:16 AM Margot Ables wrote: Medication: hydralazine & clonazepam - pt out of both medications and requesting refill from pharmacy Has the patient contacted their pharmacy? Yes.   Preferred Pharmacy (with phone number or street name): [facility], [place], [place] - [place] [phone] (Phone) [phone] (Fax)

## 2018-02-12 ENCOUNTER — Encounter: Payer: Self-pay | Admitting: Gastroenterology

## 2018-02-12 MED ORDER — CLONAZEPAM 0.5 MG PO TABS: 0.5000 mg | ORAL_TABLET | Freq: Two times a day (BID) | ORAL | 2 refills | Status: DC | PRN

## 2018-02-12 NOTE — Telephone Encounter (Signed)
Ok to ref thx

## 2018-02-12 NOTE — Telephone Encounter (Signed)
MD was out of office when message sent, will forward to PCP who is back in office.

## 2018-02-12 NOTE — Telephone Encounter (Signed)
Called clonazepam refill into [facility] spoke w/pharmasit gave MD approval../lmb

## 2018-02-16 ENCOUNTER — Ambulatory Visit: Payer: PPO | Admitting: Internal Medicine

## 2018-03-08 ENCOUNTER — Other Ambulatory Visit: Payer: Self-pay

## 2018-03-08 ENCOUNTER — Encounter: Payer: Self-pay | Admitting: Gastroenterology

## 2018-03-08 ENCOUNTER — Ambulatory Visit (AMBULATORY_SURGERY_CENTER): Payer: Self-pay

## 2018-03-08 ENCOUNTER — Other Ambulatory Visit (INDEPENDENT_AMBULATORY_CARE_PROVIDER_SITE_OTHER): Payer: PPO

## 2018-03-08 VITALS — Ht 64.5 in | Wt 193.8 lb

## 2018-03-08 DIAGNOSIS — E559 Vitamin D deficiency, unspecified: Secondary | ICD-10-CM | POA: Diagnosis not present

## 2018-03-08 DIAGNOSIS — E538 Deficiency of other specified B group vitamins: Secondary | ICD-10-CM

## 2018-03-08 DIAGNOSIS — Z860100 Personal history of colon polyps, unspecified: Secondary | ICD-10-CM

## 2018-03-08 DIAGNOSIS — E785 Hyperlipidemia, unspecified: Secondary | ICD-10-CM | POA: Diagnosis not present

## 2018-03-08 DIAGNOSIS — I110 Hypertensive heart disease with heart failure: Secondary | ICD-10-CM

## 2018-03-08 LAB — CBC WITH DIFFERENTIAL/PLATELET
BASOS PCT: 1.3 % (ref 0.0–3.0)
Basophils Absolute: 0.1 10*3/uL (ref 0.0–0.1)
EOS ABS: 0.2 10*3/uL (ref 0.0–0.7)
EOS PCT: 2.4 % (ref 0.0–5.0)
HCT: 49.2 % (ref 39.0–52.0)
Hemoglobin: 17.5 g/dL — ABNORMAL HIGH (ref 13.0–17.0)
LYMPHS ABS: 2 10*3/uL (ref 0.7–4.0)
Lymphocytes Relative: 21.5 % (ref 12.0–46.0)
MCHC: 35.5 g/dL (ref 30.0–36.0)
MCV: 92.7 fl (ref 78.0–100.0)
MONO ABS: 0.7 10*3/uL (ref 0.1–1.0)
Monocytes Relative: 7.6 % (ref 3.0–12.0)
NEUTROS PCT: 67.2 % (ref 43.0–77.0)
Neutro Abs: 6.1 10*3/uL (ref 1.4–7.7)
Platelets: 277 10*3/uL (ref 150.0–400.0)
RBC: 5.31 Mil/uL (ref 4.22–5.81)
RDW: 14 % (ref 11.5–15.5)
WBC: 9.1 10*3/uL (ref 4.0–10.5)

## 2018-03-08 LAB — VITAMIN B12: VITAMIN B 12: 747 pg/mL (ref 211–911)

## 2018-03-08 LAB — BASIC METABOLIC PANEL
BUN: 15 mg/dL (ref 6–23)
CHLORIDE: 98 meq/L (ref 96–112)
CO2: 25 mEq/L (ref 19–32)
CREATININE: 0.87 mg/dL (ref 0.40–1.50)
Calcium: 10 mg/dL (ref 8.4–10.5)
GFR: 93.02 mL/min (ref >60.00)
GLUCOSE: 95 mg/dL (ref 70–99)
POTASSIUM: 3.1 meq/L — AB (ref 3.5–5.1)
Sodium: 137 mEq/L (ref 135–145)

## 2018-03-08 LAB — URINALYSIS, ROUTINE W REFLEX MICROSCOPIC
Bilirubin Urine: NEGATIVE
Hgb urine dipstick: NEGATIVE
Ketones, ur: NEGATIVE
Leukocytes, UA: NEGATIVE
Nitrite: NEGATIVE
PH: 6.5 (ref 5.0–8.0)
SPECIFIC GRAVITY, URINE: 1.01 (ref 1.000–1.030)
TOTAL PROTEIN, URINE-UPE24: 30 — AB
URINE GLUCOSE: NEGATIVE
Urobilinogen, UA: 0.2 (ref 0.0–1.0)

## 2018-03-08 LAB — LIPID PANEL
CHOLESTEROL: 174 mg/dL (ref 0–200)
HDL: 64 mg/dL (ref >39.00)
LDL Cholesterol: 84 mg/dL (ref 0–99)
NonHDL: 109.96
TRIGLYCERIDES: 132 mg/dL (ref 0.0–149.0)
Total CHOL/HDL Ratio: 3
VLDL: 26.4 mg/dL (ref 0.0–40.0)

## 2018-03-08 LAB — VITAMIN D 25 HYDROXY (VIT D DEFICIENCY, FRACTURES): VITD: 35.67 ng/mL (ref 30.00–100.00)

## 2018-03-08 LAB — TSH: TSH: 3.41 u[IU]/mL (ref 0.35–4.50)

## 2018-03-08 MED ORDER — PEG 3350-KCL-NA BICARB-NACL 420 G PO SOLR: 4000.0000 mL | Freq: Once | ORAL | 0 refills | Status: AC

## 2018-03-08 NOTE — Progress Notes (Signed)
Denies allergies to eggs or soy products. Denies complication of anesthesia or sedation. Denies use of weight loss medication. Denies use of O2.   Emmi instructions declined.  

## 2018-03-09 ENCOUNTER — Encounter: Payer: Self-pay | Admitting: Internal Medicine

## 2018-03-09 ENCOUNTER — Ambulatory Visit (INDEPENDENT_AMBULATORY_CARE_PROVIDER_SITE_OTHER): Payer: PPO | Admitting: Internal Medicine

## 2018-03-09 VITALS — BP 144/66 | HR 54 | Temp 98.1°F | Ht 64.5 in | Wt 193.0 lb

## 2018-03-09 DIAGNOSIS — D751 Secondary polycythemia: Secondary | ICD-10-CM | POA: Diagnosis not present

## 2018-03-09 DIAGNOSIS — M5440 Lumbago with sciatica, unspecified side: Secondary | ICD-10-CM

## 2018-03-09 DIAGNOSIS — E785 Hyperlipidemia, unspecified: Secondary | ICD-10-CM

## 2018-03-09 DIAGNOSIS — G8929 Other chronic pain: Secondary | ICD-10-CM | POA: Diagnosis not present

## 2018-03-09 DIAGNOSIS — M545 Low back pain: Secondary | ICD-10-CM

## 2018-03-09 DIAGNOSIS — E538 Deficiency of other specified B group vitamins: Secondary | ICD-10-CM | POA: Diagnosis not present

## 2018-03-09 DIAGNOSIS — R252 Cramp and spasm: Secondary | ICD-10-CM

## 2018-03-09 MED ORDER — POTASSIUM CHLORIDE ER 8 MEQ PO TBCR: 8.0000 meq | EXTENDED_RELEASE_TABLET | Freq: Two times a day (BID) | ORAL | 5 refills | Status: DC

## 2018-03-09 MED ORDER — PRAVASTATIN SODIUM 20 MG PO TABS: 20.0000 mg | ORAL_TABLET | Freq: Every day | ORAL | 3 refills | Status: DC

## 2018-03-09 NOTE — Assessment & Plan Note (Signed)
Using CBD oil with ?effect

## 2018-03-09 NOTE — Assessment & Plan Note (Signed)
On B12 

## 2018-03-09 NOTE — Assessment & Plan Note (Signed)
Using CBD oil w/?effect

## 2018-03-09 NOTE — Assessment & Plan Note (Signed)
phlebotomy

## 2018-03-09 NOTE — Patient Instructions (Signed)
Stop Lipitor In two weeks start Pravastatin

## 2018-03-09 NOTE — Progress Notes (Signed)
Subjective:  Patient ID: Douglas Christensen, male    DOB: 12/07/1950  Age: 67 y.o. MRN: [id number]  CC: No chief complaint on file.   HPI Douglas Christensen presents for CAD, dyslipidemia, HTN f/u C/o a lot of cramps - worse on 20 mg of Lipitor   Outpatient Medications Prior to Visit  Medication Sig Dispense Refill  . amLODipine (NORVASC) 10 MG tablet Take 1 tablet (10 mg total) by mouth daily. 90 tablet 3  . aspirin EC 81 MG tablet Take 1 tablet (81 mg total) by mouth daily. 90 tablet 3  . atorvastatin (LIPITOR) 40 MG tablet Take 1 tablet (40 mg total) by mouth daily. 90 tablet 3  . Carboxymethylcellulose Sod PF (REFRESH CELLUVISC) 1 % GEL Place 1 drop into both eyes as needed (for dry eyes).    . cholecalciferol (VITAMIN D) 1000 units tablet Take 1,000 Units by mouth 2 (two) times daily.     . clonazePAM (KLONOPIN) 0.5 MG tablet Take 1 tablet (0.5 mg total) by mouth 2 (two) times daily as needed for anxiety. 60 tablet 2  . doxycycline (VIBRA-TABS) 100 MG tablet Take 1 tablet (100 mg total) by mouth daily. For acne 30 tablet 3  . hydrALAZINE (APRESOLINE) 50 MG tablet TAKE 50 MG BY MOUTH THREE TIMES DAILY 270 tablet 1  . metroNIDAZOLE (METROCREAM) 0.75 % cream Apply topically 2 (two) times daily. Apply to face rash 45 g 3  . OVER THE COUNTER MEDICATION Take 1 capsule by mouth daily. "Nugenix" Natural Testosterone Booster    . sildenafil (REVATIO) 20 MG tablet Take 1-5 tablets (20-100 mg total) by mouth daily as needed (ED). 50 tablet 4  . triamterene-hydrochlorothiazide (MAXZIDE-25) 37.5-25 MG tablet Take 2 tablets by mouth daily. 60 tablet 11  . vitamin B-12 (CYANOCOBALAMIN) 1000 MCG tablet Take 1,000 mcg by mouth daily.     No facility-administered medications prior to visit.     ROS Review of Systems  Constitutional: Negative for appetite change, fatigue and unexpected weight change.  HENT: Negative for congestion, nosebleeds, sneezing, sore throat and trouble swallowing.   Eyes: Negative for  itching and visual disturbance.  Respiratory: Negative for cough.   Cardiovascular: Negative for chest pain, palpitations and leg swelling.  Gastrointestinal: Negative for abdominal distention, blood in stool, diarrhea and nausea.  Genitourinary: Negative for frequency and hematuria.  Musculoskeletal: Positive for arthralgias, back pain and myalgias. Negative for gait problem, joint swelling and neck pain.  Skin: Negative for rash.  Neurological: Negative for dizziness, tremors, speech difficulty and weakness.  Psychiatric/Behavioral: Positive for sleep disturbance. Negative for agitation, dysphoric mood and suicidal ideas. The patient is not nervous/anxious.     Objective:  BP (!) 144/66 (BP Location: Left Arm, Patient Position: Sitting, Cuff Size: Large)   Pulse (!) 54   Temp 98.1 F (36.7 C) (Oral)   Ht 5' 4.5" (1.638 m)   Wt 193 lb (87.5 kg)   SpO2 96%   BMI 32.62 kg/m   BP Readings from Last 3 Encounters:  03/09/18 (!) 144/66  01/24/18 (!) 146/58  01/15/18 138/72    Wt Readings from Last 3 Encounters:  03/09/18 193 lb (87.5 kg)  03/08/18 193 lb 12.8 oz (87.9 kg)  01/24/18 200 lb (90.7 kg)    Physical Exam  Constitutional: He is oriented to person, place, and time. He appears well-developed. No distress.  NAD  HENT:  Mouth/Throat: Oropharynx is clear and moist.  Eyes: Pupils are equal, round, and reactive to light.  Conjunctivae are normal.  Neck: Normal range of motion. No JVD present. No thyromegaly present.  Cardiovascular: Normal rate, regular rhythm, normal heart sounds and intact distal pulses. Exam reveals no gallop and no friction rub.  No murmur heard. Pulmonary/Chest: Effort normal and breath sounds normal. No respiratory distress. He has no wheezes. He has no rales. He exhibits no tenderness.  Abdominal: Soft. Bowel sounds are normal. He exhibits no distension and no mass. There is no tenderness. There is no rebound and no guarding.  Musculoskeletal: Normal  range of motion. He exhibits tenderness. He exhibits no edema.  Lymphadenopathy:    He has no cervical adenopathy.  Neurological: He is alert and oriented to person, place, and time. He has normal reflexes. No cranial nerve deficit. He exhibits normal muscle tone. He displays a negative Romberg sign. Coordination and gait normal.  Skin: Skin is warm and dry. No rash noted.  Psychiatric: He has a normal mood and affect. His behavior is normal. Judgment and thought content normal.  LS tender w/ROM  Lab Results  Component Value Date   WBC 9.1 03/08/2018   HGB 17.5 (H) 03/08/2018   HCT 49.2 03/08/2018   PLT 277.0 03/08/2018   GLUCOSE 95 03/08/2018   CHOL 174 03/08/2018   TRIG 132.0 03/08/2018   HDL 64.00 03/08/2018   LDLDIRECT 138.6 11/05/2014   LDLCALC 84 03/08/2018   ALT 38 06/30/2017   AST 33 06/30/2017   NA 137 03/08/2018   K 3.1 (L) 03/08/2018   CL 98 03/08/2018   CREATININE 0.87 03/08/2018   BUN 15 03/08/2018   CO2 25 03/08/2018   TSH 3.41 03/08/2018   PSA 0.87 03/06/2017   INR 1.10 06/22/2015   HGBA1C 5.7 03/06/2017    Dg Chest 2 View  Result Date: 12/07/2017 CLINICAL DATA:  Hypertension, elevated heart rate.  Palpitations. EXAM: CHEST  2 VIEW COMPARISON:  Chest x-ray dated 12/02/2015. chest x-ray dated 06/29/2014. FINDINGS: Stable cardiomegaly. Overall cardiomediastinal silhouette is stable. Aortic atherosclerosis. Median sternotomy wires appear intact and stable in alignment. Lungs are clear. No pleural effusion or pneumothorax seen. No acute or suspicious osseous finding. IMPRESSION: No active cardiopulmonary disease. No evidence of pneumonia or pulmonary edema. Stable mild cardiomegaly. Aortic atherosclerosis. Electronically Signed   By: Franki Cabot M.D.   On: 12/07/2017 17:04    Assessment & Plan:   There are no diagnoses linked to this encounter. I am having Douglas Christensen maintain his vitamin B-12, OVER THE COUNTER MEDICATION, cholecalciferol, metroNIDAZOLE,  atorvastatin, sildenafil, Carboxymethylcellulose Sod PF, triamterene-hydrochlorothiazide, aspirin EC, amLODipine, doxycycline, hydrALAZINE, and clonazePAM.  No orders of the defined types were placed in this encounter.    Follow-up: No follow-ups on file.  [name], MD

## 2018-03-09 NOTE — Assessment & Plan Note (Signed)
Worse on 20 mg Lipitor Stop Lipitor In 2 weeks start Pravachol 20 mg/d

## 2018-03-16 ENCOUNTER — Telehealth: Payer: Self-pay | Admitting: Internal Medicine

## 2018-03-16 NOTE — Telephone Encounter (Signed)
Copied from [place] [id number]. Topic: Quick Communication - See Telephone Encounter >> Mar 16, 2018  2:40 PM Conception Chancy, NT wrote: CRM for notification. See Telephone encounter for: 03/16/18.  Patient is calling and states since Dr. Alain Marion has taken him off of his cholesterol medicine that his left leg has been swelling the past 4-5 days. States it goes down and night and he has not gained any weight with this. Please advise.

## 2018-03-19 NOTE — Telephone Encounter (Signed)
Please advise  Pt is supposed to start pravastatin on Friday

## 2018-03-20 ENCOUNTER — Other Ambulatory Visit (INDEPENDENT_AMBULATORY_CARE_PROVIDER_SITE_OTHER): Payer: PPO

## 2018-03-20 ENCOUNTER — Ambulatory Visit: Payer: Self-pay

## 2018-03-20 ENCOUNTER — Ambulatory Visit (INDEPENDENT_AMBULATORY_CARE_PROVIDER_SITE_OTHER): Payer: PPO | Admitting: Internal Medicine

## 2018-03-20 ENCOUNTER — Encounter: Payer: Self-pay | Admitting: Internal Medicine

## 2018-03-20 DIAGNOSIS — R600 Localized edema: Secondary | ICD-10-CM

## 2018-03-20 DIAGNOSIS — R252 Cramp and spasm: Secondary | ICD-10-CM | POA: Diagnosis not present

## 2018-03-20 DIAGNOSIS — D751 Secondary polycythemia: Secondary | ICD-10-CM

## 2018-03-20 LAB — BASIC METABOLIC PANEL
BUN: 12 mg/dL (ref 6–23)
CO2: 27 meq/L (ref 19–32)
Calcium: 9.6 mg/dL (ref 8.4–10.5)
Chloride: 95 mEq/L — ABNORMAL LOW (ref 96–112)
Creatinine, Ser: 0.81 mg/dL (ref 0.40–1.50)
GFR: 101.01 mL/min (ref >60.00)
Glucose, Bld: 108 mg/dL — ABNORMAL HIGH (ref 70–99)
Potassium: 3.3 mEq/L — ABNORMAL LOW (ref 3.5–5.1)
Sodium: 130 mEq/L — ABNORMAL LOW (ref 135–145)

## 2018-03-20 NOTE — Telephone Encounter (Signed)
C/o swelling left leg from foot to just below knee.  Reported has had intermittent swelling in left leg for awhile, that usually would decrease overnight, but now the swelling is not improving.  Denied redness, warmth, or localized tenderness.  Reported intermittent cramps in bilateral legs; occurs usually in the early morning or late evening.  Reported he stopped the Atorvastatin after in office on 4/12.  Reported the cramping has not improved since then, and has even worsened.  Denied open sores of left leg.  Denied any shortness of breath, chest pain, pain with deep inspiration, or cough.  Recommended pt. Have evaluation of left leg swelling today.  Appt. Scheduled at 4:00 PM.  Agreed with plan.         Reason for Disposition . [1] MODERATE leg swelling (e.g., swelling extends up to knees) AND [2] new onset or worsening  Answer Assessment - Initial Assessment Questions 1. ONSET: "When did the swelling start?" (e.g., minutes, hours, days)     Approx. About one week.  2. LOCATION: "What part of the leg is swollen?"  "Are both legs swollen or just one leg?"     Swelling in left foot to knee 3. SEVERITY: "How bad is the swelling?" (e.g., localized; mild, moderate, severe)  - Localized - small area of swelling localized to one leg  - MILD pedal edema - swelling limited to foot and ankle, pitting edema < 1/4 inch (6 mm) deep, rest and elevation eliminate most or all swelling  - MODERATE edema - swelling of lower leg to knee, pitting edema > 1/4 inch (6 mm) deep, rest and elevation only partially reduce swelling  - SEVERE edema - swelling extends above knee, facial or hand swelling present      Moderate  4. REDNESS: "Does the swelling look red or infected?"     Denied redness, tenderness, or warmth   5. PAIN: "Is the swelling painful to touch?" If so, ask: "How painful is it?"   (Scale 1-10; mild, moderate or severe)     Cramps in bilat. Legs, feet, and hands early morning or late afternoon   6.  FEVER: "Do you have a fever?" If so, ask: "What is it, how was it measured, and when did it start?"      No fever 7. CAUSE: "What do you think is causing the leg swelling?"     Unknown  8. MEDICAL HISTORY: "Do you have a history of heart failure, kidney disease, liver failure, or cancer?"    Hx of CAD; CABG x 5 2014, denied kidney, liver disease or hx of cancer 9. RECURRENT SYMPTOM: "Have you had leg swelling before?" If so, ask: "When was the last time?" "What happened that time?"     Intermittent swelling that would improve and resolve overnight 10. OTHER SYMPTOMS: "Do you have any other symptoms?" (e.g., chest pain, difficulty breathing)       Denied shortness of breath or chest pain, denied cough, denied pain with deep breath 11. PREGNANCY: "Is there any chance you are pregnant?" "When was your last menstrual period?"       No  Protocols used: LEG SWELLING AND EDEMA-A-AH

## 2018-03-20 NOTE — Assessment & Plan Note (Signed)
Labs

## 2018-03-20 NOTE — Progress Notes (Signed)
Subjective:  Patient ID: Douglas Christensen, male    DOB: 1951-07-03  Age: 67 y.o. MRN: [id number]  CC: No chief complaint on file.   HPI [name] presents for LLE swelling x 1 week. H/o leg swelling - normally would go down when in bed C/o cramps in B LEs, hands, feet  The pt is taking Norvasc 20 mg/d x weeks  Outpatient Medications Prior to Visit  Medication Sig Dispense Refill  . amLODipine (NORVASC) 10 MG tablet Take 1 tablet (10 mg total) by mouth daily. 90 tablet 3  . aspirin EC 81 MG tablet Take 1 tablet (81 mg total) by mouth daily. 90 tablet 3  . Carboxymethylcellulose Sod PF (REFRESH CELLUVISC) 1 % GEL Place 1 drop into both eyes as needed (for dry eyes).    . cholecalciferol (VITAMIN D) 1000 units tablet Take 1,000 Units by mouth 2 (two) times daily.     . clonazePAM (KLONOPIN) 0.5 MG tablet Take 1 tablet (0.5 mg total) by mouth 2 (two) times daily as needed for anxiety. 60 tablet 2  . doxycycline (VIBRA-TABS) 100 MG tablet Take 1 tablet (100 mg total) by mouth daily. For acne 30 tablet 3  . hydrALAZINE (APRESOLINE) 50 MG tablet TAKE 50 MG BY MOUTH THREE TIMES DAILY 270 tablet 1  . metroNIDAZOLE (METROCREAM) 0.75 % cream Apply topically 2 (two) times daily. Apply to face rash 45 g 3  . OVER THE COUNTER MEDICATION Take 1 capsule by mouth daily. "Nugenix" Natural Testosterone Booster    . potassium chloride (KLOR-CON) 8 MEQ tablet Take 1 tablet (8 mEq total) by mouth 2 (two) times daily. 60 tablet 5  . pravastatin (PRAVACHOL) 20 MG tablet Take 1 tablet (20 mg total) by mouth daily. 90 tablet 3  . sildenafil (REVATIO) 20 MG tablet Take 1-5 tablets (20-100 mg total) by mouth daily as needed (ED). 50 tablet 4  . triamterene-hydrochlorothiazide (MAXZIDE-25) 37.5-25 MG tablet Take 2 tablets by mouth daily. 60 tablet 11  . vitamin B-12 (CYANOCOBALAMIN) 1000 MCG tablet Take 1,000 mcg by mouth daily.     No facility-administered medications prior to visit.     ROS Review of Systems    Constitutional: Negative for appetite change, fatigue and unexpected weight change.  HENT: Negative for congestion, nosebleeds, sneezing, sore throat and trouble swallowing.   Eyes: Negative for itching and visual disturbance.  Respiratory: Negative for cough.   Cardiovascular: Positive for leg swelling. Negative for chest pain and palpitations.  Gastrointestinal: Negative for abdominal distention, blood in stool, diarrhea and nausea.  Genitourinary: Negative for frequency and hematuria.  Musculoskeletal: Positive for myalgias. Negative for back pain, gait problem, joint swelling and neck pain.  Skin: Negative for rash.  Neurological: Negative for dizziness, tremors, speech difficulty and weakness.  Psychiatric/Behavioral: Negative for agitation, dysphoric mood and sleep disturbance. The patient is not nervous/anxious.     Objective:  BP (!) 152/64 (BP Location: Left Arm, Patient Position: Sitting, Cuff Size: Large)   Pulse 60   Temp 98.4 F (36.9 C) (Oral)   Ht 5' 4.5" (1.638 m)   Wt 191 lb (86.6 kg)   SpO2 97%   BMI 32.28 kg/m   BP Readings from Last 3 Encounters:  03/20/18 (!) 152/64  03/09/18 (!) 144/66  01/24/18 (!) 146/58    Wt Readings from Last 3 Encounters:  03/20/18 191 lb (86.6 kg)  03/09/18 193 lb (87.5 kg)  03/08/18 193 lb 12.8 oz (87.9 kg)    Physical Exam  Constitutional: He is oriented to person, place, and time. He appears well-developed. No distress.  NAD  HENT:  Mouth/Throat: Oropharynx is clear and moist.  Eyes: Pupils are equal, round, and reactive to light. Conjunctivae are normal.  Neck: Normal range of motion. No JVD present. No thyromegaly present.  Cardiovascular: Normal rate, regular rhythm, normal heart sounds and intact distal pulses. Exam reveals no gallop and no friction rub.  No murmur heard. Pulmonary/Chest: Effort normal and breath sounds normal. No respiratory distress. He has no wheezes. He has no rales. He exhibits no tenderness.   Abdominal: Soft. Bowel sounds are normal. He exhibits no distension and no mass. There is no tenderness. There is no rebound and no guarding.  Musculoskeletal: Normal range of motion. He exhibits edema and tenderness.  Lymphadenopathy:    He has no cervical adenopathy.  Neurological: He is alert and oriented to person, place, and time. He has normal reflexes. No cranial nerve deficit. He exhibits normal muscle tone. He displays a negative Romberg sign. Coordination and gait normal.  Skin: Skin is warm and dry. No rash noted.  Psychiatric: He has a normal mood and affect. His behavior is normal. Judgment and thought content normal.   RLE trace edema LLE 1+ edema  Lab Results  Component Value Date   WBC 9.1 03/08/2018   HGB 17.5 (H) 03/08/2018   HCT 49.2 03/08/2018   PLT 277.0 03/08/2018   GLUCOSE 95 03/08/2018   CHOL 174 03/08/2018   TRIG 132.0 03/08/2018   HDL 64.00 03/08/2018   LDLDIRECT 138.6 11/05/2014   LDLCALC 84 03/08/2018   ALT 38 06/30/2017   AST 33 06/30/2017   NA 137 03/08/2018   K 3.1 (L) 03/08/2018   CL 98 03/08/2018   CREATININE 0.87 03/08/2018   BUN 15 03/08/2018   CO2 25 03/08/2018   TSH 3.41 03/08/2018   PSA 0.87 03/06/2017   INR 1.10 06/22/2015   HGBA1C 5.7 03/06/2017    Dg Chest 2 View  Result Date: 12/07/2017 CLINICAL DATA:  Hypertension, elevated heart rate.  Palpitations. EXAM: CHEST  2 VIEW COMPARISON:  Chest x-ray dated 12/02/2015. chest x-ray dated 06/29/2014. FINDINGS: Stable cardiomegaly. Overall cardiomediastinal silhouette is stable. Aortic atherosclerosis. Median sternotomy wires appear intact and stable in alignment. Lungs are clear. No pleural effusion or pneumothorax seen. No acute or suspicious osseous finding. IMPRESSION: No active cardiopulmonary disease. No evidence of pneumonia or pulmonary edema. Stable mild cardiomegaly. Aortic atherosclerosis. Electronically Signed   By: Franki Cabot M.D.   On: 12/07/2017 17:04    Assessment & Plan:    There are no diagnoses linked to this encounter. I am having Douglas Christensen maintain his vitamin B-12, OVER THE COUNTER MEDICATION, cholecalciferol, metroNIDAZOLE, sildenafil, Carboxymethylcellulose Sod PF, triamterene-hydrochlorothiazide, aspirin EC, amLODipine, doxycycline, hydrALAZINE, clonazePAM, pravastatin, and potassium chloride.  No orders of the defined types were placed in this encounter.    Follow-up: No follow-ups on file.  [name], MD

## 2018-03-20 NOTE — Assessment & Plan Note (Signed)
   Reduce Amlodipine to 5 mg/day to help the swelling

## 2018-03-20 NOTE — Patient Instructions (Addendum)
Your cramps are likely caused by Pravastatin or Triamterene-HCTZ meds. Stop Pravastatin for 2 weeks first to see if better  Reduce Amlodipine to 5 mg/day to help the swelling  Pulse oxymeter

## 2018-03-20 NOTE — Assessment & Plan Note (Signed)
Your cramps are likely caused by Pravastatin or Triamterene-HCTZ meds. Stop Pravastatin for 2 weeks first to see if better

## 2018-03-21 ENCOUNTER — Other Ambulatory Visit: Payer: Self-pay | Admitting: Internal Medicine

## 2018-03-21 ENCOUNTER — Other Ambulatory Visit: Payer: Self-pay

## 2018-03-21 ENCOUNTER — Telehealth: Payer: Self-pay

## 2018-03-21 ENCOUNTER — Ambulatory Visit (AMBULATORY_SURGERY_CENTER): Payer: PPO | Admitting: Gastroenterology

## 2018-03-21 ENCOUNTER — Encounter: Payer: Self-pay | Admitting: Gastroenterology

## 2018-03-21 VITALS — BP 131/53 | HR 77 | Temp 99.6°F | Resp 18 | Ht 64.5 in | Wt 193.0 lb

## 2018-03-21 DIAGNOSIS — Z1211 Encounter for screening for malignant neoplasm of colon: Secondary | ICD-10-CM | POA: Diagnosis not present

## 2018-03-21 DIAGNOSIS — M79604 Pain in right leg: Secondary | ICD-10-CM

## 2018-03-21 DIAGNOSIS — D120 Benign neoplasm of cecum: Secondary | ICD-10-CM

## 2018-03-21 DIAGNOSIS — K635 Polyp of colon: Secondary | ICD-10-CM | POA: Diagnosis not present

## 2018-03-21 DIAGNOSIS — Z86010 Personal history of colonic polyps: Secondary | ICD-10-CM

## 2018-03-21 DIAGNOSIS — D122 Benign neoplasm of ascending colon: Secondary | ICD-10-CM | POA: Diagnosis not present

## 2018-03-21 DIAGNOSIS — R609 Edema, unspecified: Secondary | ICD-10-CM

## 2018-03-21 DIAGNOSIS — D123 Benign neoplasm of transverse colon: Secondary | ICD-10-CM | POA: Diagnosis not present

## 2018-03-21 DIAGNOSIS — D124 Benign neoplasm of descending colon: Secondary | ICD-10-CM | POA: Diagnosis not present

## 2018-03-21 DIAGNOSIS — M79605 Pain in left leg: Secondary | ICD-10-CM

## 2018-03-21 DIAGNOSIS — D126 Benign neoplasm of colon, unspecified: Secondary | ICD-10-CM

## 2018-03-21 LAB — D-DIMER, QUANTITATIVE (NOT AT ARMC): D DIMER QUANT: 0.7 ug{FEU}/mL — AB (ref <0.50)

## 2018-03-21 LAB — TSH: TSH: 2.6 u[IU]/mL (ref 0.35–4.50)

## 2018-03-21 MED ORDER — SODIUM CHLORIDE 0.9 % IV SOLN
500.0000 mL | Freq: Once | INTRAVENOUS | Status: DC
Start: 2018-03-21 — End: 2019-05-03

## 2018-03-21 NOTE — Telephone Encounter (Signed)
-----   Message from Rufina Falco sent at 03/21/2018  9:04 AM EDT ----- Regarding: Order Change in [facility] Dr. Alain Marion entered an order for UE Venous.  Will you have it changed to LE Venous?   Thanks, [place]

## 2018-03-21 NOTE — Patient Instructions (Signed)
HANDOUTS GIVEN FOR POLYPS, DIVERTICULOSIS, HEMORRHOIDS AND HIGH FIBER DIET.  NO NSAIDS FOR 2 WEEKS (ASPIRIN, IBUPROFEN, ALEVE, NAPROXEN)  YOU HAD AN ENDOSCOPIC PROCEDURE TODAY AT THE [facility]:   Refer to the procedure report that was given to you for any specific questions about what was found during the examination.  If the procedure report does not answer your questions, please call your gastroenterologist to clarify.  If you requested that your care partner not be given the details of your procedure findings, then the procedure report has been included in a sealed envelope for you to review at your convenience later.  YOU SHOULD EXPECT: Some feelings of bloating in the abdomen. Passage of more gas than usual.  Walking can help get rid of the air that was put into your GI tract during the procedure and reduce the bloating. If you had a lower endoscopy (such as a colonoscopy or flexible sigmoidoscopy) you may notice spotting of blood in your stool or on the toilet paper. If you underwent a bowel prep for your procedure, you may not have a normal bowel movement for a few days.  Please Note:  You might notice some irritation and congestion in your nose or some drainage.  This is from the oxygen used during your procedure.  There is no need for concern and it should clear up in a day or so.  SYMPTOMS TO REPORT IMMEDIATELY:   Following lower endoscopy (colonoscopy or flexible sigmoidoscopy):  Excessive amounts of blood in the stool  Significant tenderness or worsening of abdominal pains  Swelling of the abdomen that is new, acute  Fever of 100F or higher  For urgent or emergent issues, a gastroenterologist can be reached at any hour by calling [phone].   DIET:  We do recommend a small meal at first, but then you may proceed to your regular diet.  Drink plenty of fluids but you should avoid alcoholic beverages for 24 hours.  ACTIVITY:  You should plan to take it easy for  the rest of today and you should NOT DRIVE or use heavy machinery until tomorrow (because of the sedation medicines used during the test).    FOLLOW UP: Our staff will call the number listed on your records the next business day following your procedure to check on you and address any questions or concerns that you may have regarding the information given to you following your procedure. If we do not reach you, we will leave a message.  However, if you are feeling well and you are not experiencing any problems, there is no need to return our call.  We will assume that you have returned to your regular daily activities without incident.  If any biopsies were taken you will be contacted by phone or by letter within the next 1-3 weeks.  Please call us at [phone] if you have not heard about the biopsies in 3 weeks.    SIGNATURES/CONFIDENTIALITY: You and/or your care partner have signed paperwork which will be entered into your electronic medical record.  These signatures attest to the fact that that the information above on your After Visit Summary has been reviewed and is understood.  Full responsibility of the confidentiality of this discharge information lies with you and/or your care-partner.

## 2018-03-21 NOTE — Progress Notes (Signed)
A and O x3. Report to RN. Tolerated MAC anesthesia well.

## 2018-03-21 NOTE — Progress Notes (Signed)
Pt's states no medical or surgical changes since previsit or office visit.Pt's states no medical or surgical changes since previsit or office visit. 

## 2018-03-21 NOTE — Op Note (Signed)
[name] Procedure Date: 03/21/2018 1:54 PM MRN: [id number] Endoscopist: Ladene Artist , MD Age: 67 Referring MD:  Date of Birth: 18-Jun-1951 Gender: Male Account #: [id number] Procedure:                Colonoscopy Indications:              Surveillance: Personal history of adenomatous                            polyps on last colonoscopy 5 years ago Medicines:                Monitored Anesthesia Care Procedure:                Pre-Anesthesia Assessment:                           - Prior to the procedure, a History and Physical                            was performed, and patient medications and                            allergies were reviewed. The patient's tolerance of                            previous anesthesia was also reviewed. The risks                            and benefits of the procedure and the sedation                            options and risks were discussed with the patient.                            All questions were answered, and informed consent                            was obtained. Prior Anticoagulants: The patient has                            taken no previous anticoagulant or antiplatelet                            agents. ASA Grade Assessment: III - A patient with                            severe systemic disease. After reviewing the risks                            and benefits, the patient was deemed in                            satisfactory condition to undergo the procedure.  After obtaining informed consent, the colonoscope                            was passed under direct vision. Throughout the                            procedure, the patient's blood pressure, pulse, and                            oxygen saturations were monitored continuously. The                            Colonoscope was introduced through the anus and                            advanced to the the cecum,  identified by                            appendiceal orifice and ileocecal valve. The                            ileocecal valve, appendiceal orifice, and rectum                            were photographed. The quality of the bowel                            preparation was adequate after extenstive lavage                            and suctioning. The colonoscopy was performed                            without difficulty. The patient tolerated the                            procedure well. Scope In: 2:03:26 PM Scope Out: 2:24:10 PM Scope Withdrawal Time: 0 hours 18 minutes 21 seconds  Total Procedure Duration: 0 hours 20 minutes 44 seconds  Findings:                 The perianal and digital rectal examinations were                            normal.                           A 10 mm polyp was found in the transverse colon.                            The polyp was semi-pedunculated. The polyp was                            removed with a cold snare. Resection and retrieval  were complete. Persistent oozing noted at the site.                            For hemostasis, one hemostatic clip was                            successfully placed (MR conditional). There was no                            bleeding at the end of the procedure.                           Three sessile polyps were found in the descending                            colon, ascending colon and cecum. The polyps were 5                            to 7 mm in size. These polyps were removed with a                            cold snare. Resection and retrieval were complete.                           Multiple small-mouthed diverticula were found in                            the left colon. There was narrowing of the colon in                            association with the diverticular opening. There                            was evidence of diverticular spasm. There was no                             evidence of diverticular bleeding.                           Internal hemorrhoids were found during                            retroflexion. The hemorrhoids were small and Grade                            I (internal hemorrhoids that do not prolapse).                           The exam was otherwise without abnormality on                            direct and retroflexion views. Complications:            No immediate complications. Estimated blood  loss:                            None. Estimated Blood Loss:     Estimated blood loss: none. Impression:               - One 10 mm polyp in the transverse colon, removed                            with a cold snare. Resected and retrieved. Clip (MR                            conditional) was placed.                           - Three 5 to 7 mm polyps in the descending colon,                            in the ascending colon and in the cecum, removed                            with a cold snare. Resected and retrieved.                           - Moderate diverticulosis in the left colon. There                            was narrowing of the colon in association with the                            diverticular opening. There was evidence of                            diverticular spasm. There was no evidence of                            diverticular bleeding.                           - Internal hemorrhoids.                           - The examination was otherwise normal on direct                            and retroflexion views. Recommendation:           - Repeat colonoscopy in 3 - 5 years for                            surveillance pending pathology review with a more                            extensive bowel prep.                           -  Patient has a contact number available for                            emergencies. The signs and symptoms of potential                            delayed complications were discussed with the                             patient. Return to normal activities tomorrow.                            Written discharge instructions were provided to the                            patient.                           - High fiber diet.                           - Continue present medications.                           - Await pathology results.                           - No aspirin, ibuprofen, naproxen, or other                            non-steroidal anti-inflammatory drugs for 2 weeks                            after polyp removal. Ladene Artist, MD 03/21/2018 2:30:06 PM This report has been signed electronically.

## 2018-03-21 NOTE — Progress Notes (Signed)
Called to room to assist during endoscopic procedure.  Patient ID and intended procedure confirmed with present staff. Received instructions for my participation in the procedure from the performing physician.  

## 2018-03-22 ENCOUNTER — Ambulatory Visit (HOSPITAL_COMMUNITY)
Admission: RE | Admit: 2018-03-22 | Discharge: 2018-03-22 | Disposition: A | Discharge status: Home / Self Care | Payer: PPO | Source: Ambulatory Visit | Attending: Vascular Surgery | Admitting: Vascular Surgery

## 2018-03-22 ENCOUNTER — Telehealth: Payer: Self-pay | Admitting: Internal Medicine

## 2018-03-22 ENCOUNTER — Telehealth: Payer: Self-pay

## 2018-03-22 DIAGNOSIS — M79604 Pain in right leg: Secondary | ICD-10-CM | POA: Diagnosis not present

## 2018-03-22 DIAGNOSIS — R609 Edema, unspecified: Secondary | ICD-10-CM | POA: Diagnosis not present

## 2018-03-22 DIAGNOSIS — M7989 Other specified soft tissue disorders: Secondary | ICD-10-CM | POA: Insufficient documentation

## 2018-03-22 DIAGNOSIS — M79605 Pain in left leg: Secondary | ICD-10-CM | POA: Diagnosis not present

## 2018-03-22 NOTE — Telephone Encounter (Signed)
Attempted to reach pt. With follow-up call following endoscopic procedure 03/21/2018.  LM on pt. Ans. Machine.  Will try to reach pt. Again later today. 

## 2018-03-22 NOTE — Telephone Encounter (Signed)
Report for DVT was negetive.

## 2018-03-22 NOTE — Telephone Encounter (Signed)
  Follow up Call-  Call back number 03/21/2018  Post procedure Call Back phone  # [phone]  Permission to leave phone message Yes  Some recent data might be hidden     Patient questions:2  Do you have a fever, pain , or abdominal swelling? No. Pain Score  0 *  Have you tolerated food without any problems? Yes.    Have you been able to return to your normal activities? Yes.    Do you have any questions about your discharge instructions: Diet   No. Medications  No. Follow up visit  No.  Do you have questions or concerns about your Care? Yes.   Pt. Reports he is still passing air.  Told pt. To be sure and walk, drink hot beverages, and rock back and forth on hands and knees.   If discomfort increases (pt. Denies pain, just awareness there is still air), to call for any further interventions. Actions: * If pain score is 4 or above: No action needed, pain <4.

## 2018-03-23 NOTE — Telephone Encounter (Signed)
Notified pt w/MD response.../lmb 

## 2018-03-23 NOTE — Telephone Encounter (Signed)
Noted - thx Pls inform the pt

## 2018-03-25 ENCOUNTER — Encounter (INDEPENDENT_AMBULATORY_CARE_PROVIDER_SITE_OTHER): Payer: Self-pay

## 2018-03-29 ENCOUNTER — Telehealth: Payer: Self-pay | Admitting: Internal Medicine

## 2018-03-29 NOTE — Telephone Encounter (Signed)
Copied from [place] [phone]. Topic: Quick Communication - See Telephone Encounter >> Mar 29, 2018  1:00 PM Synthia Innocent wrote: CRM for notification. See Telephone encounter for: 03/29/18. Patient states his left leg is still swelling, even after cutting amLODipine (NORVASC) 10 MG tablet in half, has only taking been 5mg  since 03/20/18, negative vascular US. Leg swelling goes down at night but as soon as he is on it, it starts swelling. Please advise

## 2018-03-30 NOTE — Telephone Encounter (Signed)
Do you think this can wait till Dr. Alain Marion returns Monday or is this something that needs to be switched today? If so can you send something else in.

## 2018-03-30 NOTE — Telephone Encounter (Signed)
As long as a blood clot has been rule out - this can wait until Monday.

## 2018-03-30 NOTE — Telephone Encounter (Signed)
Patient inquiring about a phone call on this issue. Patient would like a call back before the weekend. Please advise.

## 2018-04-01 ENCOUNTER — Encounter: Payer: Self-pay | Admitting: Gastroenterology

## 2018-04-02 NOTE — Telephone Encounter (Signed)
Per dr plotnikov: stop amlodipine completely, LM notifying pt.

## 2018-04-09 ENCOUNTER — Other Ambulatory Visit: Payer: Self-pay | Admitting: Family

## 2018-04-17 DIAGNOSIS — G4730 Sleep apnea, unspecified: Secondary | ICD-10-CM | POA: Diagnosis not present

## 2018-05-14 ENCOUNTER — Encounter: Payer: Self-pay | Admitting: Family

## 2018-05-14 ENCOUNTER — Ambulatory Visit (INDEPENDENT_AMBULATORY_CARE_PROVIDER_SITE_OTHER)
Admission: RE | Admit: 2018-05-14 | Discharge: 2018-05-14 | Disposition: A | Discharge status: Home / Self Care | Payer: PPO | Source: Ambulatory Visit | Attending: Family | Admitting: Family

## 2018-05-14 ENCOUNTER — Ambulatory Visit: Payer: PPO | Admitting: Family

## 2018-05-14 ENCOUNTER — Ambulatory Visit (HOSPITAL_COMMUNITY)
Admission: RE | Admit: 2018-05-14 | Discharge: 2018-05-14 | Disposition: A | Discharge status: Home / Self Care | Payer: PPO | Source: Ambulatory Visit | Attending: Family | Admitting: Family

## 2018-05-14 VITALS — BP 143/68 | HR 61 | Temp 97.1°F | Resp 18 | Ht 64.0 in | Wt 190.0 lb

## 2018-05-14 DIAGNOSIS — I779 Disorder of arteries and arterioles, unspecified: Secondary | ICD-10-CM | POA: Diagnosis not present

## 2018-05-14 DIAGNOSIS — Z95828 Presence of other vascular implants and grafts: Secondary | ICD-10-CM | POA: Diagnosis not present

## 2018-05-14 DIAGNOSIS — Z87891 Personal history of nicotine dependence: Secondary | ICD-10-CM | POA: Diagnosis not present

## 2018-05-14 DIAGNOSIS — Z8679 Personal history of other diseases of the circulatory system: Secondary | ICD-10-CM | POA: Diagnosis not present

## 2018-05-14 NOTE — Progress Notes (Signed)
[facility] [place]   CC: Follow up peripheral artery occlusive disease  History of Present Illness Douglas Christensen is a 67 y.o. male who is s/p femoral endarterectomy with vein patch angioplasty, followed by atherectomy and stenting of his left superficial femoral artery on 01-29-15 by Dr. Trula Slade. This was done for claudication. Overlapping 7 mm Cordis stents were placed. The patient did well postoperatively and was discharged to home.He has a history of right femoral to above-knee popliteal artery bypass graft with vein which was performed on 12/10/2012.   The patient was at the beach early Summer of 2017and developed bilateral leg swelling. There was no inciting factor. He has been wearing compression stockings. He has no ulcers. His swelling is better with elevation. He underwent a CT scan while at the beach which showed patent right femoral-popliteal bypass graft and patent left femoral endarterectomy with stenting of the left superficial femoral artery. There was no evidence of DVT.  Pt was last evaluated by Dr. Trula Slade on 05-02-16. At that time report from CT scan showeda patent right femoral-popliteal bypass graft and a patent left femoral endarterectomy with left superficial femoral artery stenting The patient was doing well from Dr. Lavella Lemons. Pt was tofollow up in 6 months with bilateral lower extremity duplex to evaluate his interventions. Dr. Lincoln Maxin him to continue wearing compression stockings for his leg swelling. Dr. Trula Slade did not think he neededto undergo additional venous insufficiency testing as he has alreadyhad his saphenous vein removed bilaterally.  Pt states his blood pressure at home is in the 140's/70's.   He had a CABG x 5 vessels in June 2014.  He denies any known hx of stroke or TIA.   He denies claudication type sx's with walking.    Diabetic: No Tobacco use: former smoker, quit in 2008, smoked x 45  years  Pt meds include: Statin :Yes Betablocker: Yes ASA: Yes Other anticoagulants/antiplatelets: no      Past Medical History:  Diagnosis Date  . Anxiety   . Arthritis   . CAD (coronary artery disease)    Mild plaque (cath "years ago"); abnormal Myoview 04/2013 with subsequent CABG x 5 with LIMA to LAD, SVG to OM1, SVG to DX, SVG to PD & PL.   . Cataract   . COPD (chronic obstructive pulmonary disease) ([place])   . GERD (gastroesophageal reflux disease)   . History of colonic polyps   . History of echocardiogram    Echo 2/19: Mild LVH, EF 60-65, normal wall motion, grade 2 diastolic dysfunction, mild RAE  . History of nuclear stress test    Myoview 2/19: inf/inf-lat/apical inf/ant-sept defect (?diaph atten - cannot rule out peri-infarct ischemia), PVCs/PACs/Mobitz 1  . Hx of echocardiogram    Echo (9/15):  EF 55-60%; Gr 2 DD, mild BAE  . Hyperlipidemia   . Hypertension   . LBP (low back pain)   . Meralgia paresthetica of left side 2011  . PVD (peripheral vascular disease) ([place])    Stent to left common femoral and right superficial femoral.  2008.  50%  left renal   . Second degree AV block, Mobitz type I    Holter 2/19: Sinus rhythm, average heart rate 72, frequent PVCs (burden 5%), second-degree type I AV block and periods of 2:1 heart block >> continue clinical managment and avoid AVN blocking agents  . Shortness of breath    "once in awhile; can happen at anytime" (08/26/2013)  . Sleep apnea    mod OSA,  central sleep apnea/hypoapnea syndrome 11/22/12, CPAP every night   . Vitamin D deficiency     Social History Social History   Tobacco Use  . Smoking status: Former Smoker    Packs/day: 2.00    Years: 47.00    Pack years: 94.00    Types: Cigarettes    Last attempt to quit: 12/17/2012    Years since quitting: 5.4  . Smokeless tobacco: Current User  Substance Use Topics  . Alcohol use: Yes    Alcohol/week: 2.4 oz    Types: 4 Shots of liquor per week    Comment:  08/26/2013 "3-4 mixed drinks/wk"  . Drug use: No    Family History Family History  Problem Relation Age of Onset  . Heart disease Mother        No clear CAD and Heart Disease before age 67  . Hypertension Mother   . Cancer Mother        ? colon ca  . Hyperlipidemia Mother   . Cancer Father        brain tumor  . Alzheimer's disease Father   . Colon cancer Neg Hx   . Heart attack Neg Hx   . Esophageal cancer Neg Hx   . Liver cancer Neg Hx   . Rectal cancer Neg Hx   . Stomach cancer Neg Hx   . Pancreatic cancer Neg Hx     Past Surgical History:  Procedure Laterality Date  . ABDOMINAL AORTAGRAM N/A 11/16/2011   Procedure: ABDOMINAL Maxcine Ham;  Surgeon: Sherren Mocha, MD;  Location: [facility] CATH LAB;  Service: Cardiovascular;  Laterality: N/A;  . ABDOMINAL AORTAGRAM N/A 11/14/2012   Procedure: ABDOMINAL Maxcine Ham;  Surgeon: Sherren Mocha, MD;  Location[facility] CATH LAB;  Service: Cardiovascular;  Laterality: N/A;  . ABDOMINAL AORTAGRAM N/A 12/30/2014   Procedure: ABDOMINAL Maxcine Ham;  Surgeon: Serafina Mitchell, MD;  Location: [facility] CATH LAB;  Service: Cardiovascular;  Laterality: N/A;  . CARDIAC CATHETERIZATION  05/02/13   x2   . CORONARY ARTERY BYPASS GRAFT N/A 05/06/2013   Procedure: CORONARY ARTERY BYPASS GRAFTING (CABG);  Surgeon: Melrose Nakayama, MD;  Location: [place];  Service: Open Heart Surgery;  Laterality: N/A;  Coronary artery bypass graft times five using left internal mammary artery and left greater saphenous vein via endovein harvest.  . ENDARTERECTOMY FEMORAL Left 01/29/2015   Procedure: ENDARTERECTOMY FEMORAL WITH PATCH ANGIOPLASTY;  Surgeon: Serafina Mitchell, MD;  Location: [place];  Service: Vascular;  Laterality: Left;  . EYE SURGERY  03/24/16   cataract surgery on left eye  . FEMORAL-POPLITEAL BYPASS GRAFT  12/27/2012   Procedure: BYPASS GRAFT FEMORAL-POPLITEAL ARTERY;  Surgeon: Serafina Mitchell, MD;  Location: [facility] OR;  Service: Vascular;  Laterality: Right;  using non-reversed  sapphenous vein.  Marland Kitchen ILIAC ATHERECTOMY Left 01/29/2015   Procedure: SUPERFICIAL FEMORAL ARTERY ATHERECTOMY/PERCUTANEOUS TRANSLUMINAL ANGIOPLASTY; superficial femoral artery stent;  Surgeon: Serafina Mitchell, MD;  Location: [place] [facility];  Service: Vascular;  Laterality: Left;  . LOWER EXTREMITY ANGIOGRAM Bilateral 11/16/2011   Procedure: LOWER EXTREMITY ANGIOGRAM;  Surgeon: Sherren Mocha, MD;  Location: [facility] CATH LAB;  Service: Cardiovascular;  Laterality: Bilateral;  . lower extremity stents     bilateral lower extremities x 2  . PERCUTANEOUS STENT INTERVENTION Left 11/16/2011   Procedure: PERCUTANEOUS STENT INTERVENTION;  Surgeon: Sherren Mocha, MD;  Location: [facility] CATH LAB;  Service: Cardiovascular;  Laterality: Left;  . TONSILLECTOMY    . TOTAL HIP ARTHROPLASTY Left 06/29/2015   Procedure: TOTAL HIP ARTHROPLASTY;  Surgeon: Frederik Pear, MD;  Location: [place];  Service: Orthopedics;  Laterality: Left;  LEFT TOTAL HIP ARTHROPLASTY DEPUY SROM/PINNACLE    Allergies  Allergen Reactions  . Roxicodone [Oxycodone Hcl] Other (See Comments)    hallucinations  . Doxycycline     Nausea, able to tolerate 1 a day  . Lipitor [Atorvastatin Calcium]     cramps  . Benazepril Cough  . Itraconazole Nausea Only and Rash  . Plavix [Clopidogrel Bisulfate] Rash    Current Outpatient Medications  Medication Sig Dispense Refill  . aspirin EC 81 MG tablet Take 1 tablet (81 mg total) by mouth daily. 90 tablet 3  . Carboxymethylcellulose Sod PF (REFRESH CELLUVISC) 1 % GEL Place 1 drop into both eyes as needed (for dry eyes).    . cholecalciferol (VITAMIN D) 1000 units tablet Take 1,000 Units by mouth 2 (two) times daily.     . clonazePAM (KLONOPIN) 0.5 MG tablet Take 1 tablet (0.5 mg total) by mouth 2 (two) times daily as needed for anxiety. 60 tablet 2  . hydrALAZINE (APRESOLINE) 50 MG tablet TAKE 50 MG BY MOUTH THREE TIMES DAILY 270 tablet 1  . OVER THE COUNTER MEDICATION Take 1 capsule by mouth daily. "Nugenix" Natural  Testosterone Booster    . potassium chloride (KLOR-CON) 8 MEQ tablet Take 1 tablet (8 mEq total) by mouth 2 (two) times daily. 60 tablet 5  . pravastatin (PRAVACHOL) 20 MG tablet Take 1 tablet (20 mg total) by mouth daily. 90 tablet 3  . sildenafil (REVATIO) 20 MG tablet Take 1-5 tablets (20-100 mg total) by mouth daily as needed (ED). 50 tablet 4  . triamterene-hydrochlorothiazide (MAXZIDE-25) 37.5-25 MG tablet Take 2 tablets by mouth daily. 60 tablet 11  . vitamin B-12 (CYANOCOBALAMIN) 1000 MCG tablet Take 1,000 mcg by mouth daily.    Marland Kitchen amLODipine (NORVASC) 10 MG tablet Take 1 tablet (10 mg total) by mouth daily. 90 tablet 3   Current Facility-Administered Medications  Medication Dose Route Frequency Provider Last Rate Last Dose  . 0.9 %  sodium chloride infusion  500 mL Intravenous Once Ladene Artist, MD        ROS: See HPI for pertinent positives and negatives.   Physical Examination  Vitals:   05/14/18 1453  BP: (!) 143/68  Pulse: 61  Resp: 18  Temp: (!) 97.1 F (36.2 C)  TempSrc: Oral  SpO2: 95%  Weight: 190 lb (86.2 kg)  Height: 5\' 4"  (1.626 m)   Body mass index is 32.61 kg/m.  General: A&O x 3, WDWN, obese male. Gait: normal HENT: No gross abnormalities.  Eyes: PERRLA. Pulmonary: Respirations are non labored, CTAB, good air movement in all fields Cardiac: regular rhythm, no detected murmur.         Carotid Bruits Right Left   Negative Negative   Radial pulses are 2+ palpable bilaterally   Adominal aortic pulse is not palpable                         VASCULAR EXAM: Extremities without ischemic changes, without Gangrene; without open wounds.  LE Pulses Right Left       FEMORAL  3+ palpable  3+ palpable        POPLITEAL  1+ palpable   1+ palpable       POSTERIOR TIBIAL  not palpable   2+ palpable        DORSALIS PEDIS      ANTERIOR TIBIAL 1+  palpable  2+ palpable    Abdomen: soft, NT, no palpable masses. Skin: no rashes, no cellulitis, no ulcers noted. Musculoskeletal: no muscle wasting or atrophy.  Neurologic: A&O X 3; appropriate affect, Sensation is normal; MOTOR FUNCTION:  moving all extremities equally, motor strength 5/5 throughout. Speech is fluent/normal. CN 2-12 intact. Psychiatric: Thought content is normal, mood appropriate for clinical situation.    ASSESSMENT: Douglas Christensen is a 67 y.o. male who is s/p right SFA stent placement in 2008; left iliac artery stent placement in 2013; right femoral to above knee poplieteal artery bypass graft on 12-11-12; left EIA, common femoral, profunda femoral, and SFA endarterectomy/angioplasty and left SFA stent placed on 01-29-15.   He has no claudication sx's with walking. There are no signs of ischemia in his feet or legs.   His atherosclerotic risk factors include 45 year hx of smoking (quit in 2008), COPD, CAD, OSA (treated), and obesity.  He stays physically active. He does not have DM, and takes a daily ASA and a statin.    DATA  Bilateal LE Arterial Duplex (05-14-18):  Right LE bypass graft with no restenosis, bi and monophasic waveforms.  Increased velocity in the right EIA (264 cm/s). Right outflow at 229 cm/s.  Left SFA stent with elevated velocities proximal to (223 cm/s) and in the proximal stent (232 cm/s), all biphasic waveforms.  No significant change compared to the exam on 05-10-17.    ABI (Date: 05/14/2018):  R:   ABI: 0.74 (was 1.00 on 05-10-17),   PT: bi  DP: bi  TBI:  0.67 (was 0.83)  L:   ABI: 1.11 (was 1.06),   PT: tri  DP: tri  TBI: 0.89 (was 0.81)  Significant decline in right ABI from normal to moderate disease, biphasic waveforms.   Left ABI remains normal with triphasic waveforms.     PLAN:  Continued graduated walking program. Based on the patient's vascular studies and examination, pt will return to clinic in 1  yearwith ABI's and bilateral LE arterial duplex. I advised him to notify us if he develops concerns re the circulation in his feet or legs.    I discussed in depth with the patient the nature of atherosclerosis, and emphasized the importance of maximal medical management including strict control of blood pressure, blood glucose, and lipid levels, obtaining regular exercise, and continued cessation of smoking.  The patient is aware that without maximal medical management the underlying atherosclerotic disease process will progress, limiting the benefit of any interventions.  The patient was given information about PAD including signs, symptoms, treatment, what symptoms should prompt the patient to seek immediate medical care, and risk reduction measures to take.  Clemon Chambers, RN, MSN, FNP-C [facility] Phone: [phone]  Clinic MD: Trula Slade  05/14/18 3:02 PM

## 2018-05-14 NOTE — Patient Instructions (Signed)

## 2018-05-20 DIAGNOSIS — Z87891 Personal history of nicotine dependence: Secondary | ICD-10-CM | POA: Diagnosis not present

## 2018-05-20 DIAGNOSIS — T7840XA Allergy, unspecified, initial encounter: Secondary | ICD-10-CM | POA: Insufficient documentation

## 2018-05-20 DIAGNOSIS — I10 Essential (primary) hypertension: Secondary | ICD-10-CM | POA: Diagnosis not present

## 2018-05-20 DIAGNOSIS — T63421A Toxic effect of venom of ants, accidental (unintentional), initial encounter: Secondary | ICD-10-CM | POA: Diagnosis not present

## 2018-05-29 ENCOUNTER — Other Ambulatory Visit: Payer: Self-pay | Admitting: Family

## 2018-06-07 ENCOUNTER — Ambulatory Visit (INDEPENDENT_AMBULATORY_CARE_PROVIDER_SITE_OTHER): Payer: PPO | Admitting: Internal Medicine

## 2018-06-07 ENCOUNTER — Encounter: Payer: Self-pay | Admitting: Internal Medicine

## 2018-06-07 ENCOUNTER — Other Ambulatory Visit (INDEPENDENT_AMBULATORY_CARE_PROVIDER_SITE_OTHER): Payer: PPO

## 2018-06-07 DIAGNOSIS — R0603 Acute respiratory distress: Secondary | ICD-10-CM | POA: Diagnosis not present

## 2018-06-07 DIAGNOSIS — L299 Pruritus, unspecified: Secondary | ICD-10-CM | POA: Diagnosis not present

## 2018-06-07 DIAGNOSIS — E785 Hyperlipidemia, unspecified: Secondary | ICD-10-CM

## 2018-06-07 DIAGNOSIS — D751 Secondary polycythemia: Secondary | ICD-10-CM

## 2018-06-07 DIAGNOSIS — R252 Cramp and spasm: Secondary | ICD-10-CM

## 2018-06-07 LAB — BASIC METABOLIC PANEL
BUN: 18 mg/dL (ref 6–23)
CO2: 27 mEq/L (ref 19–32)
CREATININE: 1.01 mg/dL (ref 0.40–1.50)
Calcium: 10 mg/dL (ref 8.4–10.5)
Chloride: 98 mEq/L (ref 96–112)
GFR: 78.25 mL/min (ref >60.00)
GLUCOSE: 124 mg/dL — AB (ref 70–99)
Potassium: 3.5 mEq/L (ref 3.5–5.1)
Sodium: 139 mEq/L (ref 135–145)

## 2018-06-07 LAB — CK: Total CK: 44 U/L (ref 7–232)

## 2018-06-07 LAB — LIPID PANEL
CHOL/HDL RATIO: 5
Cholesterol: 206 mg/dL — ABNORMAL HIGH (ref 0–200)
HDL: 39.8 mg/dL (ref >39.00)
NonHDL: 165.94
TRIGLYCERIDES: 287 mg/dL — AB (ref 0.0–149.0)
VLDL: 57.4 mg/dL — AB (ref 0.0–40.0)

## 2018-06-07 LAB — LDL CHOLESTEROL, DIRECT: LDL DIRECT: 148 mg/dL

## 2018-06-07 MED ORDER — EPINEPHRINE 0.3 MG/0.3ML IJ SOAJ: 0.3000 mg | Freq: Once | INTRAMUSCULAR | 3 refills | Status: AC

## 2018-06-07 NOTE — Assessment & Plan Note (Signed)
Hem ref phlebotomy

## 2018-06-07 NOTE — Progress Notes (Signed)
Subjective:  Patient ID: Douglas Christensen, male    DOB: 12/19/1950  Age: 67 y.o. MRN: [id number]  CC: No chief complaint on file.   HPI Douglas Christensen presents for CAD, HTN, polycythemia C/[place] beach on [place]13 - skin itching, throat closing - given Prednisone, Epi-pen Rx, Benadryl at the ER.  Outpatient Medications Prior to Visit  Medication Sig Dispense Refill  . aspirin EC 81 MG tablet Take 1 tablet (81 mg total) by mouth daily. 90 tablet 3  . Carboxymethylcellulose Sod PF (REFRESH CELLUVISC) 1 % GEL Place 1 drop into both eyes as needed (for dry eyes).    . cholecalciferol (VITAMIN D) 1000 units tablet Take 1,000 Units by mouth 2 (two) times daily.     . clonazePAM (KLONOPIN) 0.5 MG tablet Take 1 tablet (0.5 mg total) by mouth 2 (two) times daily as needed for anxiety. 60 tablet 2  . hydrALAZINE (APRESOLINE) 50 MG tablet TAKE 50 MG BY MOUTH THREE TIMES DAILY 270 tablet 3  . OVER THE COUNTER MEDICATION Take 1 capsule by mouth daily. "Nugenix" Natural Testosterone Booster    . potassium chloride (KLOR-CON) 8 MEQ tablet Take 1 tablet (8 mEq total) by mouth 2 (two) times daily. 60 tablet 5  . pravastatin (PRAVACHOL) 20 MG tablet Take 1 tablet (20 mg total) by mouth daily. 90 tablet 3  . sildenafil (REVATIO) 20 MG tablet Take 1-5 tablets (20-100 mg total) by mouth daily as needed (ED). 50 tablet 4  . triamterene-hydrochlorothiazide (MAXZIDE-25) 37.5-25 MG tablet Take 2 tablets by mouth daily. 60 tablet 11  . vitamin B-12 (CYANOCOBALAMIN) 1000 MCG tablet Take 1,000 mcg by mouth daily.    Marland Kitchen amLODipine (NORVASC) 10 MG tablet Take 1 tablet (10 mg total) by mouth daily. 90 tablet 3   Facility-Administered Medications Prior to Visit  Medication Dose Route Frequency Provider Last Rate Last Dose  . 0.9 %  sodium chloride infusion  500 mL Intravenous Once Ladene Artist, MD        ROS: Review of Systems  Constitutional: Negative for appetite change, fatigue and unexpected weight  change.  HENT: Negative for congestion, nosebleeds, sneezing, sore throat and trouble swallowing.   Eyes: Negative for itching and visual disturbance.  Respiratory: Negative for cough.   Cardiovascular: Negative for chest pain, palpitations and leg swelling.  Gastrointestinal: Negative for abdominal distention, blood in stool, diarrhea and nausea.  Genitourinary: Negative for frequency and hematuria.  Musculoskeletal: Positive for arthralgias and back pain. Negative for gait problem, joint swelling and neck pain.  Skin: Negative for rash.  Neurological: Negative for dizziness, tremors, speech difficulty and weakness.  Psychiatric/Behavioral: Negative for agitation, dysphoric mood and sleep disturbance. The patient is not nervous/anxious.     Objective:  BP 122/68 (BP Location: Left Arm, Patient Position: Sitting, Cuff Size: Normal)   Pulse 87   Temp 98.4 F (36.9 C) (Oral)   Ht 5\' 4"  (1.626 m)   Wt 193 lb (87.5 kg)   SpO2 97%   BMI 33.13 kg/m   BP Readings from Last 3 Encounters:  06/07/18 122/68  05/14/18 (!) 143/68  03/21/18 (!) 131/53    Wt Readings from Last 3 Encounters:  06/07/18 193 lb (87.5 kg)  05/14/18 190 lb (86.2 kg)  03/21/18 193 lb (87.5 kg)    Physical Exam  Constitutional: He is oriented to person, place, and time. He appears well-developed. No distress.  NAD  HENT:  Mouth/Throat: Oropharynx is clear and moist.  Eyes: Pupils are equal, round, and reactive to light. Conjunctivae are normal.  Neck: Normal range of motion. No JVD present. No thyromegaly present.  Cardiovascular: Normal rate, regular rhythm, normal heart sounds and intact distal pulses. Exam reveals no gallop and no friction rub.  No murmur heard. Pulmonary/Chest: Effort normal and breath sounds normal. No respiratory distress. He has no wheezes. He has no rales. He exhibits no tenderness.  Abdominal: Soft. Bowel sounds are normal. He exhibits no distension and no mass. There is no  tenderness. There is no rebound and no guarding.  Musculoskeletal: Normal range of motion. He exhibits no edema or tenderness.  Lymphadenopathy:    He has no cervical adenopathy.  Neurological: He is alert and oriented to person, place, and time. He has normal reflexes. No cranial nerve deficit. He exhibits normal muscle tone. He displays a negative Romberg sign. Coordination and gait normal.  Skin: Skin is warm and dry. No rash noted.  Psychiatric: He has a normal mood and affect. His behavior is normal. Judgment and thought content normal.   Tanned  Lab Results  Component Value Date   WBC 9.1 03/08/2018   HGB 17.5 (H) 03/08/2018   HCT 49.2 03/08/2018   PLT 277.0 03/08/2018   GLUCOSE 108 (H) 03/20/2018   CHOL 174 03/08/2018   TRIG 132.0 03/08/2018   HDL 64.00 03/08/2018   LDLDIRECT 138.6 11/05/2014   LDLCALC 84 03/08/2018   ALT 38 06/30/2017   AST 33 06/30/2017   NA 130 (L) 03/20/2018   K 3.3 (L) 03/20/2018   CL 95 (L) 03/20/2018   CREATININE 0.81 03/20/2018   BUN 12 03/20/2018   CO2 27 03/20/2018   TSH 2.60 03/20/2018   PSA 0.87 03/06/2017   INR 1.10 06/22/2015   HGBA1C 5.7 03/06/2017    No results found.  Assessment & Plan:   There are no diagnoses linked to this encounter.   No orders of the defined types were placed in this encounter.    Follow-up: No follow-ups on file.  [name], MD

## 2018-06-07 NOTE — Assessment & Plan Note (Signed)
6/19 allergic reaction vs polycythemia Hem ref

## 2018-06-07 NOTE — Patient Instructions (Signed)
Benadryl and Sudafed

## 2018-06-07 NOTE — Assessment & Plan Note (Signed)
6/19 allergic reaction vs polycythemia Epipen Benadryl Phlebotomy Hem ref

## 2018-06-15 ENCOUNTER — Telehealth: Payer: Self-pay | Admitting: *Deleted

## 2018-06-15 ENCOUNTER — Other Ambulatory Visit (INDEPENDENT_AMBULATORY_CARE_PROVIDER_SITE_OTHER): Payer: PPO

## 2018-06-15 DIAGNOSIS — D751 Secondary polycythemia: Secondary | ICD-10-CM | POA: Diagnosis not present

## 2018-06-15 LAB — CBC
HCT: 48.9 % (ref 39.0–52.0)
Hemoglobin: 17.4 g/dL — ABNORMAL HIGH (ref 13.0–17.0)
MCHC: 35.5 g/dL (ref 30.0–36.0)
MCV: 96 fl (ref 78.0–100.0)
PLATELETS: 293 10*3/uL (ref 150.0–400.0)
RBC: 5.1 Mil/uL (ref 4.22–5.81)
RDW: 13.5 % (ref 11.5–15.5)
WBC: 10.1 10*3/uL (ref 4.0–10.5)

## 2018-06-15 NOTE — Telephone Encounter (Signed)
FYI... Pt came into the lab to have labs done. Per lab tech he became very dizzy. They had check blood pressure w/machine when he became dizzy. BP drop to 70/46. I went down to the lab to triage pt. He states he is feeling a little better now. I recheck BP manually it was 152/70. Recheck w/machine it was 146/77. He states this is the second/third time when he come to do lab he get dizzy. Ask pt does he get nervous about having labs drawn he states sometimes. Pt states he has an appt w/cardiology will let MD know. Inform pt will send Dr. Alain Marion msg for FYI, but if he start having dizziness again or other sxs to f/u w/PCP or go to ED to be evaluated.Marland KitchenJohny Chess

## 2018-06-15 NOTE — Telephone Encounter (Signed)
Noted. Thx.

## 2018-07-13 ENCOUNTER — Ambulatory Visit: Payer: PPO | Admitting: *Deleted

## 2018-07-13 ENCOUNTER — Other Ambulatory Visit: Payer: Self-pay

## 2018-07-13 ENCOUNTER — Encounter: Payer: Self-pay | Admitting: Hematology & Oncology

## 2018-07-13 ENCOUNTER — Inpatient Hospital Stay: Payer: PPO | Attending: Hematology & Oncology | Admitting: Hematology & Oncology

## 2018-07-13 ENCOUNTER — Inpatient Hospital Stay: Payer: PPO

## 2018-07-13 VITALS — BP 171/77 | HR 78 | Temp 98.2°F | Resp 20 | Ht 65.0 in | Wt 196.0 lb

## 2018-07-13 VITALS — BP 148/70 | HR 62 | Resp 18 | Ht 65.0 in | Wt 197.0 lb

## 2018-07-13 DIAGNOSIS — E538 Deficiency of other specified B group vitamins: Secondary | ICD-10-CM

## 2018-07-13 DIAGNOSIS — I10 Essential (primary) hypertension: Secondary | ICD-10-CM | POA: Insufficient documentation

## 2018-07-13 DIAGNOSIS — G4730 Sleep apnea, unspecified: Secondary | ICD-10-CM | POA: Insufficient documentation

## 2018-07-13 DIAGNOSIS — Z0000 Encounter for general adult medical examination without abnormal findings: Secondary | ICD-10-CM

## 2018-07-13 DIAGNOSIS — Z7982 Long term (current) use of aspirin: Secondary | ICD-10-CM

## 2018-07-13 DIAGNOSIS — Z87891 Personal history of nicotine dependence: Secondary | ICD-10-CM | POA: Diagnosis not present

## 2018-07-13 DIAGNOSIS — I441 Atrioventricular block, second degree: Secondary | ICD-10-CM | POA: Insufficient documentation

## 2018-07-13 DIAGNOSIS — E559 Vitamin D deficiency, unspecified: Secondary | ICD-10-CM | POA: Diagnosis not present

## 2018-07-13 DIAGNOSIS — D751 Secondary polycythemia: Secondary | ICD-10-CM | POA: Diagnosis not present

## 2018-07-13 DIAGNOSIS — D45 Polycythemia vera: Secondary | ICD-10-CM

## 2018-07-13 LAB — CBC WITH DIFFERENTIAL (CANCER CENTER ONLY)
BASOS ABS: 0.1 10*3/uL (ref 0.0–0.1)
Basophils Relative: 1 %
EOS ABS: 0.3 10*3/uL (ref 0.0–0.5)
EOS PCT: 4 %
HCT: 47 % (ref 38.7–49.9)
Hemoglobin: 16.5 g/dL (ref 13.0–17.1)
LYMPHS ABS: 2.2 10*3/uL (ref 0.9–3.3)
LYMPHS PCT: 25 %
MCH: 33.3 pg (ref 28.0–33.4)
MCHC: 35.1 g/dL (ref 32.0–35.9)
MCV: 94.9 fL (ref 82.0–98.0)
Monocytes Absolute: 0.8 10*3/uL (ref 0.1–0.9)
Monocytes Relative: 9 %
NEUTROS PCT: 61 %
Neutro Abs: 5.4 10*3/uL (ref 1.5–6.5)
PLATELETS: 243 10*3/uL (ref 145–400)
RBC: 4.95 MIL/uL (ref 4.20–5.70)
RDW: 13.4 % (ref 11.1–15.7)
WBC: 8.8 10*3/uL (ref 4.0–10.0)

## 2018-07-13 LAB — CMP (CANCER CENTER ONLY)
ALK PHOS: 65 U/L (ref 26–84)
ALT: 37 U/L (ref 10–47)
AST: 44 U/L — AB (ref 11–38)
Albumin: 4.1 g/dL (ref 3.5–5.0)
Anion gap: 8 (ref 5–15)
BUN: 9 mg/dL (ref 7–22)
CALCIUM: 9.2 mg/dL (ref 8.0–10.3)
CO2: 28 mmol/L (ref 18–33)
CREATININE: 1 mg/dL (ref 0.60–1.20)
Chloride: 100 mmol/L (ref 98–108)
GLUCOSE: 155 mg/dL — AB (ref 73–118)
POTASSIUM: 3.2 mmol/L — AB (ref 3.3–4.7)
SODIUM: 136 mmol/L (ref 128–145)
TOTAL PROTEIN: 7.3 g/dL (ref 6.4–8.1)
Total Bilirubin: 0.9 mg/dL (ref 0.2–1.6)

## 2018-07-13 LAB — RETICULOCYTES
RBC.: 4.8 MIL/uL (ref 4.20–5.82)
RETIC CT PCT: 2.1 % — AB (ref 0.8–1.8)
Retic Count, Absolute: 100.8 10*3/uL — ABNORMAL HIGH (ref 34.8–93.9)

## 2018-07-13 LAB — SAVE SMEAR

## 2018-07-13 LAB — VITAMIN B12: Vitamin B-12: 1397 pg/mL — ABNORMAL HIGH (ref 180–914)

## 2018-07-13 NOTE — Patient Instructions (Addendum)
Continue doing brain stimulating activities (puzzles, reading, adult coloring books, staying active) to keep memory sharp.   Continue to eat heart healthy diet (full of fruits, vegetables, whole grains, lean protein, water--limit salt, fat, and sugar intake) and increase physical activity as tolerated.   Mr. Defino , Thank you for taking time to come for your Medicare Wellness Visit. I appreciate your ongoing commitment to your health goals. Please review the following plan we discussed and let me know if I can assist you in the future.   These are the goals we discussed: Goals    . Patient Stated     I would like to have more energy and lose weight. I will start to eat healthier and exercise more by start going to [employer] classes.     . Weight (lb) < 200 lb (90.7 kg)       This is a list of the screening recommended for you and due dates:  Health Maintenance  Topic Date Due  . Pneumonia vaccines (2 of 2 - PPSV23) 02/28/2016  . Flu Shot  06/28/2018  . Colon Cancer Screening  03/21/2021  . Tetanus Vaccine  03/02/2027  .  Hepatitis C: One time screening is recommended by Center for Disease Control  (CDC) for  adults born from [birth year] through 1965.   Completed    Fat and Cholesterol Restricted Diet Getting too much fat and cholesterol in your diet may cause health problems. Following this diet helps keep your fat and cholesterol at normal levels. This can keep you from getting sick. What types of fat should I choose?  Choose monosaturated and polyunsaturated fats. These are found in foods such as olive oil, canola oil, flaxseeds, walnuts, almonds, and seeds.  Eat more omega-3 fats. Good choices include salmon, mackerel, sardines, tuna, flaxseed oil, and ground flaxseeds.  Limit saturated fats. These are in animal products such as meats, butter, and cream. They can also be in plant products such as palm oil, palm kernel oil, and coconut oil.  Avoid foods with partially hydrogenated  oils in them. These contain trans fats. Examples of foods that have trans fats are stick margarine, some tub margarines, cookies, crackers, and other baked goods. What general guidelines do I need to follow?  Check food labels. Look for the words "trans fat" and "saturated fat."  When preparing a meal: ? Fill half of your plate with vegetables and green salads. ? Fill one fourth of your plate with whole grains. Look for the word "whole" as the first word in the ingredient list. ? Fill one fourth of your plate with lean protein foods.  Eat more foods that have fiber, like apples, carrots, beans, peas, and barley.  Eat more home-cooked foods. Eat less at restaurants and buffets.  Limit or avoid alcohol.  Limit foods high in starch and sugar.  Limit fried foods.  Cook foods without frying them. Baking, boiling, grilling, and broiling are all great options.  Lose weight if you are overweight. Losing even a small amount of weight can help your overall health. It can also help prevent diseases such as diabetes and heart disease. What foods can I eat? Grains Whole grains, such as whole wheat or whole grain breads, crackers, cereals, and pasta. Unsweetened oatmeal, bulgur, barley, quinoa, or brown rice. Corn or whole wheat flour tortillas. Vegetables Fresh or frozen vegetables (raw, steamed, roasted, or grilled). Green salads. Fruits All fresh, canned (in natural juice), or frozen fruits. Meat and Other Protein Products  Ground beef (85% or leaner), grass-fed beef, or beef trimmed of fat. Skinless chicken or [place]. Ground chicken or [place]. Pork trimmed of fat. All fish and seafood. Eggs. Dried beans, peas, or lentils. Unsalted nuts or seeds. Unsalted canned or dry beans. Dairy Low-fat dairy products, such as skim or 1% milk, 2% or reduced-fat cheeses, low-fat ricotta or cottage cheese, or plain low-fat yogurt. Fats and Oils Tub margarines without trans fats. Light or reduced-fat  mayonnaise and salad dressings. Avocado. Olive, canola, sesame, or safflower oils. Natural peanut or almond butter (choose ones without added sugar and oil). The items listed above may not be a complete list of recommended foods or beverages. Contact your dietitian for more options. What foods are not recommended? Grains White bread. White pasta. White rice. Cornbread. Bagels, pastries, and croissants. Crackers that contain trans fat. Vegetables White potatoes. Corn. Creamed or fried vegetables. Vegetables in a cheese sauce. Fruits Dried fruits. Canned fruit in light or heavy syrup. Fruit juice. Meat and Other Protein Products Fatty cuts of meat. Ribs, chicken wings, bacon, sausage, bologna, salami, chitterlings, fatback, hot dogs, bratwurst, and packaged luncheon meats. Liver and organ meats. Dairy Whole or 2% milk, cream, half-and-half, and cream cheese. Whole milk cheeses. Whole-fat or sweetened yogurt. Full-fat cheeses. Nondairy creamers and whipped toppings. Processed cheese, cheese spreads, or cheese curds. Sweets and Desserts Corn syrup, sugars, honey, and molasses. Candy. Jam and jelly. Syrup. Sweetened cereals. Cookies, pies, cakes, donuts, muffins, and ice cream. Fats and Oils Butter, stick margarine, lard, shortening, ghee, or bacon fat. Coconut, palm kernel, or palm oils. Beverages Alcohol. Sweetened drinks (such as sodas, lemonade, and fruit drinks or punches). The items listed above may not be a complete list of foods and beverages to avoid. Contact your dietitian for more information. This information is not intended to replace advice given to you by your health care provider. Make sure you discuss any questions you have with your health care provider. Document Released: 05/15/2012 Document Revised: 07/21/2016 Document Reviewed: 02/13/2014 Elsevier Interactive Patient Education  2018 [place] Maintenance, Male A healthy lifestyle and preventive care is  important for your health and wellness. Ask your health care provider about what schedule of regular examinations is right for you. What should I know about weight and diet? Eat a Healthy Diet  Eat plenty of vegetables, fruits, whole grains, low-fat dairy products, and lean protein.  Do not eat a lot of foods high in solid fats, added sugars, or salt.  Maintain a Healthy Weight Regular exercise can help you achieve or maintain a healthy weight. You should:  Do at least 150 minutes of exercise each week. The exercise should increase your heart rate and make you sweat (moderate-intensity exercise).  Do strength-training exercises at least twice a week.  Watch Your Levels of Cholesterol and Blood Lipids  Have your blood tested for lipids and cholesterol every 5 years starting at 67 years of age. If you are at high risk for heart disease, you should start having your blood tested when you are 67 years old. You may need to have your cholesterol levels checked more often if: ? Your lipid or cholesterol levels are high. ? You are older than 67 years of age. ? You are at high risk for heart disease.  What should I know about cancer screening? Many types of cancers can be detected early and may often be prevented. Lung Cancer  You should be screened every year for lung cancer if: ?  You are a current smoker who has smoked for at least 30 years. ? You are a former smoker who has quit within the past 15 years.  Talk to your health care provider about your screening options, when you should start screening, and how often you should be screened.  Colorectal Cancer  Routine colorectal cancer screening usually begins at 67 years of age and should be repeated every 5-10 years until you are 67 years old. You may need to be screened more often if early forms of precancerous polyps or small growths are found. Your health care provider may recommend screening at an earlier age if you have risk factors  for colon cancer.  Your health care provider may recommend using home test kits to check for hidden blood in the stool.  A small camera at the end of a tube can be used to examine your colon (sigmoidoscopy or colonoscopy). This checks for the earliest forms of colorectal cancer.  Prostate and Testicular Cancer  Depending on your age and overall health, your health care provider may do certain tests to screen for prostate and testicular cancer.  Talk to your health care provider about any symptoms or concerns you have about testicular or prostate cancer.  Skin Cancer  Check your skin from head to toe regularly.  Tell your health care provider about any new moles or changes in moles, especially if: ? There is a change in a mole's size, shape, or color. ? You have a mole that is larger than a pencil eraser.  Always use sunscreen. Apply sunscreen liberally and repeat throughout the day.  Protect yourself by wearing long sleeves, pants, a wide-brimmed hat, and sunglasses when outside.  What should I know about heart disease, diabetes, and high blood pressure?  If you are 64-51 years of age, have your blood pressure checked every 3-5 years. If you are 29 years of age or older, have your blood pressure checked every year. You should have your blood pressure measured twice-once when you are at a hospital or clinic, and once when you are not at a hospital or clinic. Record the average of the two measurements. To check your blood pressure when you are not at a hospital or clinic, you can use: ? An automated blood pressure machine at a pharmacy. ? A home blood pressure monitor.  Talk to your health care provider about your target blood pressure.  If you are between 109-65 years old, ask your health care provider if you should take aspirin to prevent heart disease.  Have regular diabetes screenings by checking your fasting blood sugar level. ? If you are at a normal weight and have a low risk  for diabetes, have this test once every three years after the age of 40. ? If you are overweight and have a high risk for diabetes, consider being tested at a younger age or more often.  A one-time screening for abdominal aortic aneurysm (AAA) by ultrasound is recommended for men aged 42-75 years who are current or former smokers. What should I know about preventing infection? Hepatitis B If you have a higher risk for hepatitis B, you should be screened for this virus. Talk with your health care provider to find out if you are at risk for hepatitis B infection. Hepatitis C Blood testing is recommended for:  Everyone born from [birth year] through 1965.  Anyone with known risk factors for hepatitis C.  Sexually Transmitted Diseases (STDs)  You should be screened each year  for STDs including gonorrhea and chlamydia if: ? You are sexually active and are younger than 67 years of age. ? You are older than 67 years of age and your health care provider tells you that you are at risk for this type of infection. ? Your sexual activity has changed since you were last screened and you are at an increased risk for chlamydia or gonorrhea. Ask your health care provider if you are at risk.  Talk with your health care provider about whether you are at high risk of being infected with HIV. Your health care provider may recommend a prescription medicine to help prevent HIV infection.  What else can I do?  Schedule regular health, dental, and eye exams.  Stay current with your vaccines (immunizations).  Do not use any tobacco products, such as cigarettes, chewing tobacco, and e-cigarettes. If you need help quitting, ask your health care provider.  Limit alcohol intake to no more than 2 drinks per day. One drink equals 12 ounces of beer, 5 ounces of wine, or 1 ounces of hard liquor.  Do not use street drugs.  Do not share needles.  Ask your health care provider for help if you need support or information  about quitting drugs.  Tell your health care provider if you often feel depressed.  Tell your health care provider if you have ever been abused or do not feel safe at home. This information is not intended to replace advice given to you by your health care provider. Make sure you discuss any questions you have with your health care provider. Document Released: 05/12/2008 Document Revised: 07/13/2016 Document Reviewed: 08/18/2015 Elsevier Interactive Patient Education  2018 [place] Eating Plan DASH stands for "Dietary Approaches to Stop Hypertension." The DASH eating plan is a healthy eating plan that has been shown to reduce high blood pressure (hypertension). It may also reduce your risk for type 2 diabetes, heart disease, and stroke. The DASH eating plan may also help with weight loss. What are tips for following this plan? General guidelines  Avoid eating more than 2,300 mg (milligrams) of salt (sodium) a day. If you have hypertension, you may need to reduce your sodium intake to 1,500 mg a day.  Limit alcohol intake to no more than 1 drink a day for nonpregnant women and 2 drinks a day for men. One drink equals 12 oz of beer, 5 oz of wine, or 1 oz of hard liquor.  Work with your health care provider to maintain a healthy body weight or to lose weight. Ask what an ideal weight is for you.  Get at least 30 minutes of exercise that causes your heart to beat faster (aerobic exercise) most days of the week. Activities may include walking, swimming, or biking.  Work with your health care provider or diet and nutrition specialist (dietitian) to adjust your eating plan to your individual calorie needs. Reading food labels  Check food labels for the amount of sodium per serving. Choose foods with less than 5 percent of the Daily Value of sodium. Generally, foods with less than 300 mg of sodium per serving fit into this eating plan.  To find whole grains, look for the word "whole"  as the first word in the ingredient list. Shopping  Buy products labeled as "low-sodium" or "no salt added."  Buy fresh foods. Avoid canned foods and premade or frozen meals. Cooking  Avoid adding salt when cooking. Use salt-free seasonings or herbs instead of table  salt or sea salt. Check with your health care provider or pharmacist before using salt substitutes.  Do not fry foods. Cook foods using healthy methods such as baking, boiling, grilling, and broiling instead.  Cook with heart-healthy oils, such as olive, canola, soybean, or sunflower oil. Meal planning   Eat a balanced diet that includes: ? 5 or more servings of fruits and vegetables each day. At each meal, try to fill half of your plate with fruits and vegetables. ? Up to 6-8 servings of whole grains each day. ? Less than 6 oz of lean meat, poultry, or fish each day. A 3-oz serving of meat is about the same size as a deck of cards. One egg equals 1 oz. ? 2 servings of low-fat dairy each day. ? A serving of nuts, seeds, or beans 5 times each week. ? Heart-healthy fats. Healthy fats called Omega-3 fatty acids are found in foods such as flaxseeds and coldwater fish, like sardines, salmon, and mackerel.  Limit how much you eat of the following: ? Canned or prepackaged foods. ? Food that is high in trans fat, such as fried foods. ? Food that is high in saturated fat, such as fatty meat. ? Sweets, desserts, sugary drinks, and other foods with added sugar. ? Full-fat dairy products.  Do not salt foods before eating.  Try to eat at least 2 vegetarian meals each week.  Eat more home-cooked food and less restaurant, buffet, and fast food.  When eating at a restaurant, ask that your food be prepared with less salt or no salt, if possible. What foods are recommended? The items listed may not be a complete list. Talk with your dietitian about what dietary choices are best for you. Grains Whole-grain or whole-wheat bread.  Whole-grain or whole-wheat pasta. Brown rice. [place]. Bulgur. Whole-grain and low-sodium cereals. Pita bread. Low-fat, low-sodium crackers. Whole-wheat flour tortillas. Vegetables Fresh or frozen vegetables (raw, steamed, roasted, or grilled). Low-sodium or reduced-sodium tomato and vegetable juice. Low-sodium or reduced-sodium tomato sauce and tomato paste. Low-sodium or reduced-sodium canned vegetables. Fruits All fresh, dried, or frozen fruit. Canned fruit in natural juice (without added sugar). Meat and other protein foods Skinless chicken or [place]. Ground chicken or [place]. Pork with fat trimmed off. Fish and seafood. Egg whites. Dried beans, peas, or lentils. Unsalted nuts, nut butters, and seeds. Unsalted canned beans. Lean cuts of beef with fat trimmed off. Low-sodium, lean deli meat. Dairy Low-fat (1%) or fat-free (skim) milk. Fat-free, low-fat, or reduced-fat cheeses. Nonfat, low-sodium ricotta or cottage cheese. Low-fat or nonfat yogurt. Low-fat, low-sodium cheese. Fats and oils Soft margarine without trans fats. Vegetable oil. Low-fat, reduced-fat, or light mayonnaise and salad dressings (reduced-sodium). Canola, safflower, olive, soybean, and sunflower oils. Avocado. Seasoning and other foods Herbs. Spices. Seasoning mixes without salt. Unsalted popcorn and pretzels. Fat-free sweets. What foods are not recommended? The items listed may not be a complete list. Talk with your dietitian about what dietary choices are best for you. Grains Baked goods made with fat, such as croissants, muffins, or some breads. Dry pasta or rice meal packs. Vegetables Creamed or fried vegetables. Vegetables in a cheese sauce. Regular canned vegetables (not low-sodium or reduced-sodium). Regular canned tomato sauce and paste (not low-sodium or reduced-sodium). Regular tomato and vegetable juice (not low-sodium or reduced-sodium). Angie Fava. Olives. Fruits Canned fruit in a light or heavy syrup.  Fried fruit. Fruit in cream or butter sauce. Meat and other protein foods Fatty cuts of meat. Ribs. Fried meat. [place] Salines.  Sausage. Bologna and other processed lunch meats. Salami. Fatback. Hotdogs. Bratwurst. Salted nuts and seeds. Canned beans with added salt. Canned or smoked fish. Whole eggs or egg yolks. Chicken or [place] with skin. Dairy Whole or 2% milk, cream, and half-and-half. Whole or full-fat cream cheese. Whole-fat or sweetened yogurt. Full-fat cheese. Nondairy creamers. Whipped toppings. Processed cheese and cheese spreads. Fats and oils Butter. Stick margarine. Lard. Shortening. Ghee. Bacon fat. Tropical oils, such as coconut, palm kernel, or palm oil. Seasoning and other foods Salted popcorn and pretzels. Onion salt, garlic salt, seasoned salt, table salt, and sea salt. Worcestershire sauce. Tartar sauce. Barbecue sauce. Teriyaki sauce. Soy sauce, including reduced-sodium. Steak sauce. Canned and packaged gravies. Fish sauce. Oyster sauce. Cocktail sauce. Horseradish that you find on the shelf. Ketchup. Mustard. Meat flavorings and tenderizers. Bouillon cubes. Hot sauce and Tabasco sauce. Premade or packaged marinades. Premade or packaged taco seasonings. Relishes. Regular salad dressings. Where to find more information:  National Heart, Lung, and [place]: https://wilson-eaton.com/  American Heart Association: www.heart.org Summary  The DASH eating plan is a healthy eating plan that has been shown to reduce high blood pressure (hypertension). It may also reduce your risk for type 2 diabetes, heart disease, and stroke.  With the DASH eating plan, you should limit salt (sodium) intake to 2,300 mg a day. If you have hypertension, you may need to reduce your sodium intake to 1,500 mg a day.  When on the DASH eating plan, aim to eat more fresh fruits and vegetables, whole grains, lean proteins, low-fat dairy, and heart-healthy fats.  Work with your health care provider or diet and  nutrition specialist (dietitian) to adjust your eating plan to your individual calorie needs. This information is not intended to replace advice given to you by your health care provider. Make sure you discuss any questions you have with your health care provider. Document Released: 11/03/2011 Document Revised: 11/07/2016 Document Reviewed: 11/07/2016 Elsevier Interactive Patient Education  [employer].

## 2018-07-13 NOTE — Progress Notes (Signed)
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 New Patient (Initial Visit)  : I was told to come here by my doctor.  HPI: Douglas Christensen is a real nice 67 year old white male.  He used to work in the [employer].  Used to work at [employer].  He did this for 10 years.  He is now retired.  He still works part-time for the jail.  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  He was a very heavy smoker.  He stopped about 5 years ago.  He probably has over 100-pack-year history of tobacco use.  He also has sleep apnea.  He does have a CPAP machine.  He has been using it for about 4-5 years.  He has been noted to have some erythrocytosis.  He says that he has been phlebotomized.  Going through his labs, back in January of this year, a CBC was done which showed a white cell count 10.9.  Hemoglobin 18.1.  Platelet count 212,000.  His hematocrit was 51.8.  His MCV was 94.  In April 2019, his white count was 9.1.  Hemoglobin 17.5.  Hematocrit 49.2.  Reticulocyte count 277,000.  His MCV was 93.  In July of this year, another CBC was done which showed a white cell count of 10.1.  Hemoglobin 17.4 with a hematocrit of 48.9.  Platelet count 293,000.  His MCV was 96.  I think he was kindly referred to the [facility] for evaluation of the erythrocytosis.  He does drink on occasion.  He does not have diabetes.  He has had cardiac surgery.  He has had peripheral vascular surgery.  He says that he has poor circulation because of his past tobacco use.  He does not use any testosterone supplements.  He is not a vegetarian.  He really does not exercise all that much.  He has been taking baby aspirin.   Overall, his performance status is ECOG 0.    Past Medical History:  Diagnosis Date  . Anxiety   . Arthritis   . CAD (coronary artery disease)    Mild plaque (cath  "years ago"); abnormal Myoview 04/2013 with subsequent CABG x 5 with LIMA to LAD, SVG to OM1, SVG to DX, SVG to PD & PL.   . Cataract   . COPD (chronic obstructive pulmonary disease) ([place])   . GERD (gastroesophageal reflux disease)   . History of colonic polyps   . History of echocardiogram    Echo 2/19: Mild LVH, EF 60-65, normal wall motion, grade 2 diastolic dysfunction, mild RAE  . History of nuclear stress test    Myoview 2/19: inf/inf-lat/apical inf/ant-sept defect (?diaph atten - cannot rule out peri-infarct ischemia), PVCs/PACs/Mobitz 1  . Hx of echocardiogram    Echo (9/15):  EF 55-60%; Gr 2 DD, mild BAE  . Hyperlipidemia   . Hypertension   . LBP (low back pain)   . Meralgia paresthetica of left side 2011  . PVD (peripheral vascular disease) ([place])    Stent to left common femoral and right superficial femoral.  2008.  50%  left renal   . Second degree AV block, Mobitz type I    Holter 2/19: Sinus rhythm, average heart rate 72, frequent PVCs (burden 5%), second-degree type I AV block and periods of 2:1 heart block >> continue clinical managment and avoid AVN blocking agents  . Shortness of  breath    "once in awhile; can happen at anytime" (08/26/2013)  . Sleep apnea    mod OSA, central sleep apnea/hypoapnea syndrome 11/22/12, CPAP every night   . Vitamin D deficiency   :  Past Surgical History:  Procedure Laterality Date  . ABDOMINAL AORTAGRAM N/A 11/16/2011   Procedure: ABDOMINAL Maxcine Ham;  Surgeon: Sherren Mocha, MD;  Location: [facility] CATH LAB;  Service: Cardiovascular;  Laterality: N/A;  . ABDOMINAL AORTAGRAM N/A 11/14/2012   Procedure: ABDOMINAL Maxcine Ham;  Surgeon: Sherren Mocha, MD;  Location[facility] CATH LAB;  Service: Cardiovascular;  Laterality: N/A;  . ABDOMINAL AORTAGRAM N/A 12/30/2014   Procedure: ABDOMINAL Maxcine Ham;  Surgeon: Serafina Mitchell, MD;  Location: [facility] CATH LAB;  Service: Cardiovascular;  Laterality: N/A;  . CARDIAC CATHETERIZATION  05/02/13   x2   . CORONARY  ARTERY BYPASS GRAFT N/A 05/06/2013   Procedure: CORONARY ARTERY BYPASS GRAFTING (CABG);  Surgeon: Melrose Nakayama, MD;  Location: [place];  Service: Open Heart Surgery;  Laterality: N/A;  Coronary artery bypass graft times five using left internal mammary artery and left greater saphenous vein via endovein harvest.  . ENDARTERECTOMY FEMORAL Left 01/29/2015   Procedure: ENDARTERECTOMY FEMORAL WITH PATCH ANGIOPLASTY;  Surgeon: Serafina Mitchell, MD;  Location: [place];  Service: Vascular;  Laterality: Left;  . EYE SURGERY  03/24/16   cataract surgery on left eye  . FEMORAL-POPLITEAL BYPASS GRAFT  12/27/2012   Procedure: BYPASS GRAFT FEMORAL-POPLITEAL ARTERY;  Surgeon: Serafina Mitchell, MD;  Location: [facility] OR;  Service: Vascular;  Laterality: Right;  using non-reversed sapphenous vein.  Marland Kitchen ILIAC ATHERECTOMY Left 01/29/2015   Procedure: SUPERFICIAL FEMORAL ARTERY ATHERECTOMY/PERCUTANEOUS TRANSLUMINAL ANGIOPLASTY; superficial femoral artery stent;  Surgeon: Serafina Mitchell, MD;  Location: [place];  Service: Vascular;  Laterality: Left;  . LOWER EXTREMITY ANGIOGRAM Bilateral 11/16/2011   Procedure: LOWER EXTREMITY ANGIOGRAM;  Surgeon: Sherren Mocha, MD;  Location: [facility] CATH LAB;  Service: Cardiovascular;  Laterality: Bilateral;  . lower extremity stents     bilateral lower extremities x 2  . PERCUTANEOUS STENT INTERVENTION Left 11/16/2011   Procedure: PERCUTANEOUS STENT INTERVENTION;  Surgeon: Sherren Mocha, MD;  Location: [facility] CATH LAB;  Service: Cardiovascular;  Laterality: Left;  . TONSILLECTOMY    . TOTAL HIP ARTHROPLASTY Left 06/29/2015   Procedure: TOTAL HIP ARTHROPLASTY;  Surgeon: Frederik Pear, MD;  Location: [place];  Service: Orthopedics;  Laterality: Left;  LEFT TOTAL HIP ARTHROPLASTY DEPUY SROM/PINNACLE  :   Current Outpatient Medications:  .  amLODipine (NORVASC) 10 MG tablet, Take 1 tablet (10 mg total) by mouth daily., Disp: 90 tablet, Rfl: 3 .  aspirin EC 81 MG tablet, Take 1 tablet (81 mg total) by  mouth daily., Disp: 90 tablet, Rfl: 3 .  cholecalciferol (VITAMIN D) 1000 units tablet, Take 1,000 Units by mouth 2 (two) times daily. , Disp: , Rfl:  .  clonazePAM (KLONOPIN) 0.5 MG tablet, Take 1 tablet (0.5 mg total) by mouth 2 (two) times daily as needed for anxiety., Disp: 60 tablet, Rfl: 2 .  hydrALAZINE (APRESOLINE) 50 MG tablet, TAKE 50 MG BY MOUTH THREE TIMES DAILY, Disp: 270 tablet, Rfl: 3 .  OVER THE COUNTER MEDICATION, Take 1 capsule by mouth daily. "Nugenix" Natural Testosterone Booster, Disp: , Rfl:  .  potassium chloride (KLOR-CON) 8 MEQ tablet, Take 1 tablet (8 mEq total) by mouth 2 (two) times daily., Disp: 60 tablet, Rfl: 5 .  pravastatin (PRAVACHOL) 20 MG tablet, Take 1 tablet (20 mg total) by mouth daily.,  Disp: 90 tablet, Rfl: 3 .  sildenafil (REVATIO) 20 MG tablet, Take 1-5 tablets (20-100 mg total) by mouth daily as needed (ED)., Disp: 50 tablet, Rfl: 4 .  triamterene-hydrochlorothiazide (MAXZIDE-25) 37.5-25 MG tablet, Take 2 tablets by mouth daily., Disp: 60 tablet, Rfl: 11 .  vitamin B-12 (CYANOCOBALAMIN) 1000 MCG tablet, Take 1,000 mcg by mouth daily., Disp: , Rfl:   Current Facility-Administered Medications:  .  0.9 %  sodium chloride infusion, 500 mL, Intravenous, Once, Ladene Artist, MD:  :  Allergies  Allergen Reactions  . Roxicodone [Oxycodone Hcl] Other (See Comments)    hallucinations  . Doxycycline     Nausea, able to tolerate 1 a day  . Lipitor [Atorvastatin Calcium]     cramps  . Benazepril Cough  . Itraconazole Nausea Only and Rash  . Plavix [Clopidogrel Bisulfate] Rash  :  Family History  Problem Relation Age of Onset  . Heart disease Mother        No clear CAD and Heart Disease before age 61  . Hypertension Mother   . Cancer Mother        ? colon ca  . Hyperlipidemia Mother   . Cancer Father        brain tumor  . Alzheimer's disease Father   . Colon cancer Neg Hx   . Heart attack Neg Hx   . Esophageal cancer Neg Hx   . Liver cancer  Neg Hx   . Rectal cancer Neg Hx   . Stomach cancer Neg Hx   . Pancreatic cancer Neg Hx   :  Social History   Socioeconomic History  . Marital status: Widowed    Spouse name: Not on file  . Number of children: 2  . Years of education: Not on file  . Highest education level: Not on file  Occupational History  . Occupation: English as a second language teacher: [employer]  . Financial resource strain: Not on file  . Food insecurity:    Worry: Not on file    Inability: Not on file  . Transportation needs:    Medical: Not on file    Non-medical: Not on file  Tobacco Use  . Smoking status: Former Smoker    Packs/day: 2.00    Years: 47.00    Pack years: 94.00    Types: Cigarettes    Last attempt to quit: 12/17/2012    Years since quitting: 5.5  . Smokeless tobacco: Current User  Substance and Sexual Activity  . Alcohol use: Yes    Alcohol/week: 4.0 standard drinks    Types: 4 Shots of liquor per week    Comment: 08/26/2013 "3-4 mixed drinks/wk"  . Drug use: No  . Sexual activity: Yes  Lifestyle  . Physical activity:    Days per week: Not on file    Minutes per session: Not on file  . Stress: Not on file  Relationships  . Social connections:    Talks on phone: Not on file    Gets together: Not on file    Attends religious service: Not on file    Active member of club or organization: Not on file    Attends meetings of clubs or organizations: Not on file    Relationship status: Not on file  . Intimate partner violence:    Fear of current or ex partner: Not on file    Emotionally abused: Not on file    Physically abused: Not on file  Forced sexual activity: Not on file  Other Topics Concern  . Not on file  Social History Narrative   Widower  :  Review of Systems  Constitutional: Negative.   HENT: Negative.   Eyes: Negative.   Respiratory: Negative.   Cardiovascular: Negative.   Gastrointestinal: Negative.   Genitourinary: Negative.   Musculoskeletal:  Negative.   Skin: Negative.   Neurological: Negative.   Endo/Heme/Allergies: Negative.   Psychiatric/Behavioral: Negative.      Exam: Moderately obese white male in no obvious distress.  I must say that he does have a little bit of a ruddy complexion.  His vital signs show temperature of 98.2.  Pulse 78.  Blood pressure 171/77.  Weight is 196 pounds.  Head neck exam shows no ocular or oral lesions.  He has no scleral icterus.  He has no conjunctival inflammation.  There is no adenopathy in the neck.  Thyroid is nonpalpable.  Lungs are clear bilaterally.  Cardiac exam regular rate and rhythm with no murmurs, rubs or bruits.  Abdomen is soft.  Abdomen is obese.  He has no fluid wave.  He has good bowel sounds.  He has no palpable liver or spleen tip.  Back exam shows no tenderness over the spine, ribs or hips.  Extremities shows no clubbing, cyanosis or edema.  Skin exam does show a little bit of a ruddy complexion.  He has scattered ecchymoses.  Neurological exam is nonfocal. @IPVITALS @   Recent Labs    07/13/18 1055  WBC 8.8  HGB 16.5  HCT 47.0  PLT 243   Recent Labs    07/13/18 1055  NA 136  K 3.2*  CL 100  CO2 28  GLUCOSE 155*  BUN 9  CREATININE 1.00  CALCIUM 9.2    Blood smear review: Pending   Pathology: None    Assessment and Plan: Douglas Christensen is a very nice 67 year old white male.  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  I would think that this is secondary to his tobacco use.  I did send off a JAK2 assay.  This will help [place] out.  We will see what his erythropoietin level is.  We will see what his iron levels show.  I would think that the erythrocytosis might be physiologic.  It has been coming down since January.  The fact that he is on CPAP I think will certainly help the erythrocytosis as he will not sustain a hypoxic state while he is sleeping.  For right now, I think we should just see how his labs look.  I do not  think we have to phlebotomize him.  I spent about 45 minutes with Douglas Christensen.  All the time was spent face-to-face.  I reviewed all of his lab work.  I answered his questions.  I helped coordinate follow-up care.  We will plan to see him back in 3 months.  If his JAK2 as a come back positive, then we will certainly get him back sooner for a phlebotomy and then see about setting up with a phlebotomy program to maintain a hematocrit below 45%.  I told to make sure he keeps taking the baby aspirin.

## 2018-07-13 NOTE — Progress Notes (Addendum)
Subjective:   Douglas Christensen is a 67 y.o. male who presents for Medicare Annual/Subsequent preventive examination.  Review of Systems:  No ROS.  Medicare Wellness Visit. Additional risk factors are reflected in the social history.  Cardiac Risk Factors include: advanced age (>15men, >43 women);hypertension;male gender;obesity (BMI >30kg/m2) Sleep patterns: sleeps through the night, feels rested on waking, does not get up to void and sleeps 7-8 hours nightly. Wears C-PAP  Home Safety/Smoke Alarms: Feels safe in home. Smoke alarms in place.  Living environment; residence and Firearm Safety: 1-story house/ trailer, no firearms. Lives alone, no needs for DME, good support system Seat Belt Safety/Bike Helmet: Wears seat belt.   PSA-  Lab Results  Component Value Date   PSA 0.87 03/06/2017   PSA 0.61 03/01/2016   PSA 0.86 11/05/2014       Objective:    Vitals: BP (!) 148/70   Pulse 62   Resp 18   Ht 5\' 5"  (1.651 m)   Wt 197 lb (89.4 kg)   SpO2 99%   BMI 32.78 kg/m   Body mass index is 32.78 kg/m.  Advanced Directives 07/13/2018 07/13/2018 07/28/2017 06/20/2017 05/10/2017 03/01/2017 05/02/2016  Does Patient Have a Medical Advance Directive? No No No No No No Yes  Type of Advance Directive - - - - - - Press photographer;Living will  Copy of [place] in Chart? - - - - - - Yes  Would patient like information on creating a medical advance directive? Yes (ED - Information included in AVS) - No - Patient declined Yes (ED - Information included in AVS) - - -  Pre-existing out of facility DNR order (yellow form or pink MOST form) - - - - - - -    Tobacco Social History   Tobacco Use  Smoking Status Former Smoker  . Packs/day: 2.00  . Years: 47.00  . Pack years: 94.00  . Types: Cigarettes  . Last attempt to quit: 12/17/2012  . Years since quitting: 5.5  Smokeless Tobacco Never Used     Counseling given: Not Answered  Past Medical History:  Diagnosis  Date  . Anxiety   . Arthritis   . CAD (coronary artery disease)    Mild plaque (cath "years ago"); abnormal Myoview 04/2013 with subsequent CABG x 5 with LIMA to LAD, SVG to OM1, SVG to DX, SVG to PD & PL.   . Cataract   . COPD (chronic obstructive pulmonary disease) ([place])   . GERD (gastroesophageal reflux disease)   . History of colonic polyps   . History of echocardiogram    Echo 2/19: Mild LVH, EF 60-65, normal wall motion, grade 2 diastolic dysfunction, mild RAE  . History of nuclear stress test    Myoview 2/19: inf/inf-lat/apical inf/ant-sept defect (?diaph atten - cannot rule out peri-infarct ischemia), PVCs/PACs/Mobitz 1  . Hx of echocardiogram    Echo (9/15):  EF 55-60%; Gr 2 DD, mild BAE  . Hyperlipidemia   . Hypertension   . LBP (low back pain)   . Meralgia paresthetica of left side 2011  . PVD (peripheral vascular disease) ([place])    Stent to left common femoral and right superficial femoral.  2008.  50%  left renal   . Second degree AV block, Mobitz type I    Holter 2/19: Sinus rhythm, average heart rate 72, frequent PVCs (burden 5%), second-degree type I AV block and periods of 2:1 heart block >> continue clinical managment and avoid AVN  blocking agents  . Shortness of breath    "once in awhile; can happen at anytime" (08/26/2013)  . Sleep apnea    mod OSA, central sleep apnea/hypoapnea syndrome 11/22/12, CPAP every night   . Vitamin D deficiency    Past Surgical History:  Procedure Laterality Date  . ABDOMINAL AORTAGRAM N/A 11/16/2011   Procedure: ABDOMINAL Maxcine Ham;  Surgeon: Sherren Mocha, MD;  Location: [facility] CATH LAB;  Service: Cardiovascular;  Laterality: N/A;  . ABDOMINAL AORTAGRAM N/A 11/14/2012   Procedure: ABDOMINAL Maxcine Ham;  Surgeon: Sherren Mocha, MD;  Location: [facility] CATH LAB;  Service: Cardiovascular;  Laterality: N/A;  . ABDOMINAL AORTAGRAM N/A 12/30/2014   Procedure: ABDOMINAL Maxcine Ham;  Surgeon: Serafina Mitchell, MD;  Location: [facility] CATH LAB;  Service:  Cardiovascular;  Laterality: N/A;  . CARDIAC CATHETERIZATION  05/02/13   x2   . CORONARY ARTERY BYPASS GRAFT N/A 05/06/2013   Procedure: CORONARY ARTERY BYPASS GRAFTING (CABG);  Surgeon: Melrose Nakayama, MD;  Location: [facility];  Service: Open Heart Surgery;  Laterality: N/A;  Coronary artery bypass graft times five using left internal mammary artery and left greater saphenous vein via endovein harvest.  . ENDARTERECTOMY FEMORAL Left 01/29/2015   Procedure: ENDARTERECTOMY FEMORAL WITH PATCH ANGIOPLASTY;  Surgeon: Serafina Mitchell, MD;  Location: [facility];  Service: Vascular;  Laterality: Left;  . EYE SURGERY  03/24/16   cataract surgery on left eye  . FEMORAL-POPLITEAL BYPASS GRAFT  12/27/2012   Procedure: BYPASS GRAFT FEMORAL-POPLITEAL ARTERY;  Surgeon: Serafina Mitchell, MD;  Location: [facility] OR;  Service: Vascular;  Laterality: Right;  using non-reversed sapphenous vein.  Marland Kitchen ILIAC ATHERECTOMY Left 01/29/2015   Procedure: SUPERFICIAL FEMORAL ARTERY ATHERECTOMY/PERCUTANEOUS TRANSLUMINAL ANGIOPLASTY; superficial femoral artery stent;  Surgeon: Serafina Mitchell, MD;  Location: [facility];  Service: Vascular;  Laterality: Left;  . LOWER EXTREMITY ANGIOGRAM Bilateral 11/16/2011   Procedure: LOWER EXTREMITY ANGIOGRAM;  Surgeon: Sherren Mocha, MD;  Location: [facility] CATH LAB;  Service: Cardiovascular;  Laterality: Bilateral;  . lower extremity stents     bilateral lower extremities x 2  . PERCUTANEOUS STENT INTERVENTION Left 11/16/2011   Procedure: PERCUTANEOUS STENT INTERVENTION;  Surgeon: Sherren Mocha, MD;  Location: [facility] CATH LAB;  Service: Cardiovascular;  Laterality: Left;  . TONSILLECTOMY    . TOTAL HIP ARTHROPLASTY Left 06/29/2015   Procedure: TOTAL HIP ARTHROPLASTY;  Surgeon: Frederik Pear, MD;  Location: [facility];  Service: Orthopedics;  Laterality: Left;  LEFT TOTAL HIP ARTHROPLASTY DEPUY SROM/PINNACLE   Family History  Problem Relation Age of Onset  . Heart disease Mother        No clear CAD and Heart Disease before  age 31  . Hypertension Mother   . Cancer Mother        ? colon ca  . Hyperlipidemia Mother   . Cancer Father        brain tumor  . Alzheimer's disease Father   . Colon cancer Neg Hx   . Heart attack Neg Hx   . Esophageal cancer Neg Hx   . Liver cancer Neg Hx   . Rectal cancer Neg Hx   . Stomach cancer Neg Hx   . Pancreatic cancer Neg Hx    Social History   Socioeconomic History  . Marital status: Widowed    Spouse name: Not on file  . Number of children: 2  . Years of education: Not on file  . Highest education level: Not on file  Occupational History  . Occupation: Tax adviser  Employer: [employer]  Social Needs  . Financial resource strain: Not hard at all  . Food insecurity:    Worry: Never true    Inability: Never true  . Transportation needs:    Medical: No    Non-medical: No  Tobacco Use  . Smoking status: Former Smoker    Packs/day: 2.00    Years: 47.00    Pack years: 94.00    Types: Cigarettes    Last attempt to quit: 12/17/2012    Years since quitting: 5.5  . Smokeless tobacco: Never Used  Substance and Sexual Activity  . Alcohol use: Yes    Alcohol/week: 4.0 standard drinks    Types: 4 Shots of liquor per week    Comment: 08/26/2013 "3-4 mixed drinks/wk"  . Drug use: No  . Sexual activity: Yes  Lifestyle  . Physical activity:    Days per week: 4 days    Minutes per session: 50 min  . Stress: Not at all  Relationships  . Social connections:    Talks on phone: More than three times a week    Gets together: More than three times a week    Attends religious service: Never    Active member of club or organization: Yes    Attends meetings of clubs or organizations: Never    Relationship status: Not on file  Other Topics Concern  . Not on file  Social History Narrative   Widower    Outpatient Encounter Medications as of 07/13/2018  Medication Sig  . aspirin EC 81 MG tablet Take 1 tablet (81 mg total) by mouth daily.  . cholecalciferol  (VITAMIN D) 1000 units tablet Take 1,000 Units by mouth 2 (two) times daily.   . clonazePAM (KLONOPIN) 0.5 MG tablet Take 1 tablet (0.5 mg total) by mouth 2 (two) times daily as needed for anxiety.  . hydrALAZINE (APRESOLINE) 50 MG tablet TAKE 50 MG BY MOUTH THREE TIMES DAILY  . OVER THE COUNTER MEDICATION Take 1 capsule by mouth daily. "Nugenix" Natural Testosterone Booster  . potassium chloride (KLOR-CON) 8 MEQ tablet Take 1 tablet (8 mEq total) by mouth 2 (two) times daily.  . pravastatin (PRAVACHOL) 20 MG tablet Take 1 tablet (20 mg total) by mouth daily.  . sildenafil (REVATIO) 20 MG tablet Take 1-5 tablets (20-100 mg total) by mouth daily as needed (ED).  . triamterene-hydrochlorothiazide (MAXZIDE-25) 37.5-25 MG tablet Take 2 tablets by mouth daily.  . vitamin B-12 (CYANOCOBALAMIN) 1000 MCG tablet Take 1,000 mcg by mouth daily.  Marland Kitchen amLODipine (NORVASC) 10 MG tablet Take 1 tablet (10 mg total) by mouth daily.   Facility-Administered Encounter Medications as of 07/13/2018  Medication  . 0.9 %  sodium chloride infusion    Activities of Daily Living In your present state of health, do you have any difficulty performing the following activities: 07/13/2018 07/28/2017  Hearing? N N  Vision? N N  Difficulty concentrating or making decisions? N N  Walking or climbing stairs? N N  Dressing or bathing? N N  Doing errands, shopping? N N  Preparing Food and eating ? N -  Using the Toilet? N -  In the past six months, have you accidently leaked urine? N -  Do you have problems with loss of bowel control? N -  Managing your Medications? N -  Managing your Finances? N -  Housekeeping or managing your Housekeeping? N -  Some recent data might be hidden    Patient Care Team: Plotnikov, Evie Lacks,  MD as PCP - Cyndia Diver, MD as PCP - Cardiology (Cardiology) Frederik Pear, MD as Consulting Physician (Orthopedic Surgery) Chesley Mires, MD as Consulting Physician (Pulmonary  Disease) Ladene Artist, MD as Consulting Physician (Gastroenterology) Warden Fillers, MD as Consulting Physician (Ophthalmology)   Assessment:   This is a routine wellness examination for Bas[place]. Physical assessment deferred to PCP.   Exercise Activities and Dietary recommendations Current Exercise Habits: The patient has a physically strenous job, but has no regular exercise apart from work., Exercise limited by: None identified  Diet (meal preparation, eat out, water intake, caffeinated beverages, dairy products, fruits and vegetables): in general, a "healthy" diet     Reviewed heart healthy diet. Encouraged patient to increase daily water and healthy fluid intake. Discussed weight loss strategies and diet. Diet education was attached to patient's AVS. Relevant patient education assigned to patient using Emmi.   Goals    . Patient Stated     I would like to have more energy and lose weight. I will start to eat healthier and exercise more by start going to [employer] classes.     . Weight (lb) < 200 lb (90.7 kg)       Fall Risk Fall Risk  07/13/2018 03/09/2018 03/01/2017 04/29/2016  Falls in the past year? No No No No    Depression Screen PHQ 2/9 Scores 03/09/2018 03/01/2017 04/29/2016 02/12/2014  PHQ - 2 Score 0 0 0 0    Cognitive Function MMSE - Mini Mental State Exam 07/13/2018  Orientation to time 5  Orientation to Place 5  Registration 3  Attention/ Calculation 5  Recall 3  Language- name 2 objects 2  Language- repeat 1  Language- follow 3 step command 3  Language- read & follow direction 1  Write a sentence 1  Copy design 1  Total score 30        Immunization History  Administered Date(s) Administered  . Influenza Split 12/28/2012  . Influenza Whole 08/18/2010  . Influenza, High Dose Seasonal PF 09/05/2016, 09/06/2017  . Influenza,inj,Quad PF,6+ Mos 07/22/2015  . Influenza-Unspecified 01/16/2014  . Pneumococcal Conjugate-13 01/27/2014  . Pneumococcal  Polysaccharide-23 08/28/2006  . Tdap 03/01/2017   Screening Tests Health Maintenance  Topic Date Due  . PNA vac Low Risk Adult (2 of 2 - PPSV23) 02/28/2016  . INFLUENZA VACCINE  06/28/2018  . COLONOSCOPY  03/21/2021  . TETANUS/TDAP  03/02/2027  . Hepatitis C Screening  Completed      Plan:     Continue doing brain stimulating activities (puzzles, reading, adult coloring books, staying active) to keep memory sharp.   Continue to eat heart healthy diet (full of fruits, vegetables, whole grains, lean protein, water--limit salt, fat, and sugar intake) and increase physical activity as tolerated.  I have personally reviewed and noted the following in the patient's chart:   . Medical and social history . Use of alcohol, tobacco or illicit drugs  . Current medications and supplements . Functional ability and status . Nutritional status . Physical activity . Advanced directives . List of other physicians . Vitals . Screenings to include cognitive, depression, and falls . Referrals and appointments  In addition, I have reviewed and discussed with patient certain preventive protocols, quality metrics, and best practice recommendations. A written personalized care plan for preventive services as well as general preventive health recommendations were provided to patient.     Michiel Cowboy, RN  07/13/2018  Medical screening examination/treatment/procedure(s) were performed by non-physician practitioner and  as supervising physician I was immediately available for consultation/collaboration. I agree with above. Lew Dawes, MD

## 2018-07-14 LAB — TESTOSTERONE: TESTOSTERONE: 416 ng/dL (ref 264–916)

## 2018-07-14 LAB — ERYTHROPOIETIN: ERYTHROPOIETIN: 10.3 m[IU]/mL (ref 2.6–18.5)

## 2018-07-16 LAB — IRON AND TIBC
Iron: 90 ug/dL (ref 42–163)
SATURATION RATIOS: 21 % — AB (ref 42–163)
TIBC: 421 ug/dL — ABNORMAL HIGH (ref 202–409)
UIBC: 332 ug/dL

## 2018-07-16 LAB — FERRITIN: Ferritin: 63 ng/mL (ref 24–336)

## 2018-08-10 LAB — MPN-ET/MYELOFIBROSIS (JAK2 V617F-MPL515-CALR)

## 2018-08-17 ENCOUNTER — Other Ambulatory Visit: Payer: Self-pay | Admitting: Family

## 2018-08-17 NOTE — Telephone Encounter (Signed)
Last refill 04/09/18

## 2018-09-13 ENCOUNTER — Encounter: Payer: Self-pay | Admitting: Internal Medicine

## 2018-09-13 DIAGNOSIS — H04123 Dry eye syndrome of bilateral lacrimal glands: Secondary | ICD-10-CM | POA: Diagnosis not present

## 2018-09-13 DIAGNOSIS — H01009 Unspecified blepharitis unspecified eye, unspecified eyelid: Secondary | ICD-10-CM | POA: Diagnosis not present

## 2018-09-13 DIAGNOSIS — Z961 Presence of intraocular lens: Secondary | ICD-10-CM | POA: Diagnosis not present

## 2018-09-13 DIAGNOSIS — H35373 Puckering of macula, bilateral: Secondary | ICD-10-CM | POA: Diagnosis not present

## 2018-09-27 DIAGNOSIS — H35372 Puckering of macula, left eye: Secondary | ICD-10-CM | POA: Diagnosis not present

## 2018-09-27 DIAGNOSIS — H43812 Vitreous degeneration, left eye: Secondary | ICD-10-CM | POA: Diagnosis not present

## 2018-09-27 DIAGNOSIS — H35371 Puckering of macula, right eye: Secondary | ICD-10-CM | POA: Diagnosis not present

## 2018-10-01 ENCOUNTER — Other Ambulatory Visit: Payer: PPO | Admitting: *Deleted

## 2018-10-01 DIAGNOSIS — E785 Hyperlipidemia, unspecified: Secondary | ICD-10-CM | POA: Diagnosis not present

## 2018-10-01 DIAGNOSIS — I10 Essential (primary) hypertension: Secondary | ICD-10-CM | POA: Diagnosis not present

## 2018-10-01 LAB — COMPREHENSIVE METABOLIC PANEL
A/G RATIO: 1.8 (ref 1.2–2.2)
ALT: 33 IU/L (ref 0–44)
AST: 29 IU/L (ref 0–40)
Albumin: 4.4 g/dL (ref 3.6–4.8)
Alkaline Phosphatase: 66 IU/L (ref 39–117)
BUN/Creatinine Ratio: 14 (ref 10–24)
BUN: 13 mg/dL (ref 8–27)
Bilirubin Total: 0.6 mg/dL (ref 0.0–1.2)
CALCIUM: 9.7 mg/dL (ref 8.6–10.2)
CO2: 22 mmol/L (ref 20–29)
Chloride: 95 mmol/L — ABNORMAL LOW (ref 96–106)
Creatinine, Ser: 0.9 mg/dL (ref 0.76–1.27)
GFR calc Af Amer: 102 mL/min/{1.73_m2} (ref >59)
GFR, EST NON AFRICAN AMERICAN: 88 mL/min/{1.73_m2} (ref >59)
GLOBULIN, TOTAL: 2.4 g/dL (ref 1.5–4.5)
Glucose: 105 mg/dL — ABNORMAL HIGH (ref 65–99)
POTASSIUM: 3.4 mmol/L — AB (ref 3.5–5.2)
Sodium: 138 mmol/L (ref 134–144)
Total Protein: 6.8 g/dL (ref 6.0–8.5)

## 2018-10-01 LAB — LIPID PANEL
CHOLESTEROL TOTAL: 199 mg/dL (ref 100–199)
Chol/HDL Ratio: 5 ratio (ref 0.0–5.0)
HDL: 40 mg/dL (ref >39)
LDL CALC: 128 mg/dL — AB (ref 0–99)
TRIGLYCERIDES: 156 mg/dL — AB (ref 0–149)
VLDL Cholesterol Cal: 31 mg/dL (ref 5–40)

## 2018-10-02 ENCOUNTER — Telehealth: Payer: Self-pay | Admitting: *Deleted

## 2018-10-02 DIAGNOSIS — E785 Hyperlipidemia, unspecified: Secondary | ICD-10-CM

## 2018-10-02 DIAGNOSIS — I10 Essential (primary) hypertension: Secondary | ICD-10-CM

## 2018-10-02 MED ORDER — ROSUVASTATIN CALCIUM 10 MG PO TABS: 10.0000 mg | ORAL_TABLET | Freq: Every day | ORAL | 3 refills | Status: DC

## 2018-10-02 NOTE — Telephone Encounter (Signed)
Left message to go over lab results and medication changes. Also per Brynda Rim. PA move pt to his schedule as he has seen pt more and is on Dr. Antionette Char care team.

## 2018-10-02 NOTE — Telephone Encounter (Signed)
-----   Message from Liliane Shi, PA-C sent at 10/01/2018  3:57 PM EST ----- LDL above goal (goal < 70).  Glucose mildly elevated.  Kidney function normal.  The potassium is low.  LFTs normal.   Recommendations:  - DC Pravastatin  - Start Rosuvastatin 10 mg QD  - Increase K+ to 16 mEq Twice daily   - BMET 1 week  - Lipids and LFTs 3 mos   - I have seen Mr. Mark in the past and have several slots open this week.  He can see me one day this week (ok to use 72 hour, next day appts) if he would like.  Richardson Dopp, PA-C    10/01/2018 3:42 PM

## 2018-10-02 NOTE — Telephone Encounter (Signed)
Pt called back and has been notified of lab results and medication changes. Pt is agreeable to plan of care. Pt states he has only been taking his K+ 8 meq once a day and then not taking everyday. I advised pt to follow recommendation per Brynda Rim. PA to increase K+ to 16 meq BID, with bmet to be done in 1 week when he see's [place] 10/10/18 @ 11:15 am . I have cancelled the 11/14 appt with Cecilie Kicks, NP. FLP/LFT has been scheduled for 12/31/18. Pt aware to d/c Pravastatin and start Crestor 10 mg daily; Rx has been sent in to [facility].    D/W Brynda Rim. PA for further K+ recommendations. Per [place] have pt resume his K+ 8 meq BID and we will check BMET when he comes in 11/13.

## 2018-10-02 NOTE — Telephone Encounter (Signed)
-----   Message from Liliane Shi, PA-C sent at 10/01/2018  3:57 PM EST ----- LDL above goal (goal < 70).  Glucose mildly elevated.  Kidney function normal.  The potassium is low.  LFTs normal.   Recommendations:  - DC Pravastatin  - Start Rosuvastatin 10 mg QD  - Increase K+ to 16 mEq Twice daily   - BMET 1 week  - Lipids and LFTs 3 mos   - I have seen Mr. Rounds in the past and have several slots open this week.  He can see me one day this week (ok to use 72 hour, next day appts) if he would like.  Richardson Dopp, PA-C    10/01/2018 3:42 PM

## 2018-10-10 ENCOUNTER — Ambulatory Visit: Payer: PPO | Admitting: Physician Assistant

## 2018-10-11 ENCOUNTER — Telehealth: Payer: Self-pay | Admitting: Hematology & Oncology

## 2018-10-11 ENCOUNTER — Ambulatory Visit: Payer: PPO | Admitting: Cardiology

## 2018-10-11 NOTE — Telephone Encounter (Signed)
Received call from pt to resch 11/15 appt to 12/9 at 2 pm

## 2018-10-12 ENCOUNTER — Other Ambulatory Visit: Payer: PPO

## 2018-10-12 ENCOUNTER — Ambulatory Visit: Payer: PPO | Admitting: Hematology & Oncology

## 2018-10-19 ENCOUNTER — Ambulatory Visit: Payer: Self-pay

## 2018-10-19 NOTE — Telephone Encounter (Signed)
Ret'd call to pt.  Stated he would like a prescription for Cialis.  Reported he has not taken the Sildenafil for a couple months due to it making him nauseated.  Stated that his pharmacist told him that the Cialis is more affordable, so he would like to try this.  The pt. requested that he be notified via MyChart if this can be done.     Reason for Disposition . [1] Caller requesting NON-URGENT health information AND [2] PCP's office is the best resource  Answer Assessment - Initial Assessment Questions 1. REASON FOR CALL or QUESTION: "What is your reason for calling today?" or "How can I best help you?" or "What question do you have that I can help answer?"     Requesting Rx for Cialis  Protocols used: INFORMATION ONLY CALL-A-AH  ----- Message from Luciana Axe sent at 10/19/2018 3:31 PM EST ----- Patient is calling for medication management. Patient was taking Sildenafil. This medication was making him sick.  Patient has stopped taking the medication in 27month. Patient is requesting a script for Cialis. Please advise [phone]

## 2018-10-20 NOTE — Telephone Encounter (Signed)
Please advise 

## 2018-10-22 MED ORDER — TADALAFIL 20 MG PO TABS: 20.0000 mg | ORAL_TABLET | Freq: Every day | ORAL | 1 refills | Status: DC | PRN

## 2018-10-22 NOTE — Telephone Encounter (Signed)
Ok Done Thx 

## 2018-10-22 NOTE — Addendum Note (Signed)
Addended by: Cassandria Anger on: 10/22/2018 05:16 PM   Modules accepted: Orders

## 2018-10-30 DIAGNOSIS — G4730 Sleep apnea, unspecified: Secondary | ICD-10-CM | POA: Diagnosis not present

## 2018-10-31 DIAGNOSIS — H35372 Puckering of macula, left eye: Secondary | ICD-10-CM | POA: Diagnosis not present

## 2018-11-01 ENCOUNTER — Other Ambulatory Visit: Payer: Self-pay | Admitting: Family

## 2018-11-01 DIAGNOSIS — E785 Hyperlipidemia, unspecified: Secondary | ICD-10-CM

## 2018-11-05 ENCOUNTER — Other Ambulatory Visit: Payer: Self-pay

## 2018-11-05 ENCOUNTER — Inpatient Hospital Stay: Payer: PPO | Attending: Hematology & Oncology

## 2018-11-05 ENCOUNTER — Inpatient Hospital Stay (HOSPITAL_BASED_OUTPATIENT_CLINIC_OR_DEPARTMENT_OTHER): Payer: PPO | Admitting: Hematology & Oncology

## 2018-11-05 ENCOUNTER — Inpatient Hospital Stay: Payer: PPO

## 2018-11-05 VITALS — BP 144/70 | HR 95 | Resp 16

## 2018-11-05 VITALS — BP 160/75 | HR 98 | Temp 98.6°F | Resp 20 | Wt 202.5 lb

## 2018-11-05 DIAGNOSIS — D45 Polycythemia vera: Secondary | ICD-10-CM

## 2018-11-05 DIAGNOSIS — D751 Secondary polycythemia: Secondary | ICD-10-CM

## 2018-11-05 LAB — CMP (CANCER CENTER ONLY)
ALK PHOS: 59 U/L (ref 38–126)
ALT: 34 U/L (ref 0–44)
ANION GAP: 11 (ref 5–15)
AST: 29 U/L (ref 15–41)
Albumin: 4.3 g/dL (ref 3.5–5.0)
BILIRUBIN TOTAL: 0.6 mg/dL (ref 0.3–1.2)
BUN: 16 mg/dL (ref 8–23)
CALCIUM: 9.1 mg/dL (ref 8.9–10.3)
CO2: 26 mmol/L (ref 22–32)
CREATININE: 0.97 mg/dL (ref 0.61–1.24)
Chloride: 100 mmol/L (ref 98–111)
Glucose, Bld: 136 mg/dL — ABNORMAL HIGH (ref 70–99)
Potassium: 3.4 mmol/L — ABNORMAL LOW (ref 3.5–5.1)
Sodium: 137 mmol/L (ref 135–145)
TOTAL PROTEIN: 6.9 g/dL (ref 6.5–8.1)

## 2018-11-05 LAB — CBC WITH DIFFERENTIAL (CANCER CENTER ONLY)
Abs Immature Granulocytes: 0.05 10*3/uL (ref 0.00–0.07)
Basophils Absolute: 0.1 10*3/uL (ref 0.0–0.1)
Basophils Relative: 1 %
Eosinophils Absolute: 0.2 10*3/uL (ref 0.0–0.5)
Eosinophils Relative: 2 %
HCT: 50.3 % (ref 39.0–52.0)
HEMOGLOBIN: 17.9 g/dL — AB (ref 13.0–17.0)
Immature Granulocytes: 1 %
LYMPHS PCT: 24 %
Lymphs Abs: 2 10*3/uL (ref 0.7–4.0)
MCH: 32.7 pg (ref 26.0–34.0)
MCHC: 35.6 g/dL (ref 30.0–36.0)
MCV: 91.8 fL (ref 80.0–100.0)
MONO ABS: 0.7 10*3/uL (ref 0.1–1.0)
MONOS PCT: 9 %
Neutro Abs: 5.3 10*3/uL (ref 1.7–7.7)
Neutrophils Relative %: 63 %
Platelet Count: 235 10*3/uL (ref 150–400)
RBC: 5.48 MIL/uL (ref 4.22–5.81)
RDW: 12.8 % (ref 11.5–15.5)
WBC: 8.4 10*3/uL (ref 4.0–10.5)
nRBC: 0 % (ref 0.0–0.2)

## 2018-11-05 MED ORDER — SODIUM CHLORIDE 0.9 % IV SOLN
Freq: Once | INTRAVENOUS | Status: AC
  Administered 2018-11-05: 15:00:00 via INTRAVENOUS
  Filled 2018-11-05: qty 250

## 2018-11-05 NOTE — Patient Instructions (Signed)

## 2018-11-05 NOTE — Progress Notes (Signed)
Douglas Christensen presents today for phlebotomy per MD orders. Phlebotomy procedure started at 1450 and ended at 1510. 518 grams removed from lt AC using 18g IV cath. Patient observed for 30 minutes after procedure without any incident while IVF given.  Patient tolerated procedure well. IV needle removed intact.

## 2018-11-07 ENCOUNTER — Encounter

## 2018-11-07 ENCOUNTER — Ambulatory Visit (INDEPENDENT_AMBULATORY_CARE_PROVIDER_SITE_OTHER): Payer: PPO | Admitting: Physician Assistant

## 2018-11-07 ENCOUNTER — Encounter: Payer: Self-pay | Admitting: Physician Assistant

## 2018-11-07 VITALS — BP 144/60 | HR 104 | Ht 65.0 in | Wt 203.0 lb

## 2018-11-07 DIAGNOSIS — I10 Essential (primary) hypertension: Secondary | ICD-10-CM

## 2018-11-07 DIAGNOSIS — I779 Disorder of arteries and arterioles, unspecified: Secondary | ICD-10-CM | POA: Diagnosis not present

## 2018-11-07 DIAGNOSIS — I441 Atrioventricular block, second degree: Secondary | ICD-10-CM

## 2018-11-07 DIAGNOSIS — E876 Hypokalemia: Secondary | ICD-10-CM | POA: Diagnosis not present

## 2018-11-07 DIAGNOSIS — I2510 Atherosclerotic heart disease of native coronary artery without angina pectoris: Secondary | ICD-10-CM

## 2018-11-07 DIAGNOSIS — E785 Hyperlipidemia, unspecified: Secondary | ICD-10-CM

## 2018-11-07 MED ORDER — LOSARTAN POTASSIUM 25 MG PO TABS: 25.0000 mg | ORAL_TABLET | Freq: Every day | ORAL | 3 refills | Status: DC

## 2018-11-07 MED ORDER — POTASSIUM CHLORIDE ER 8 MEQ PO CPCR: 16.0000 meq | ORAL_CAPSULE | Freq: Two times a day (BID) | ORAL | 11 refills | Status: DC

## 2018-11-07 NOTE — Patient Instructions (Signed)
Medication Instructions:  Your physician has recommended you make the following change in your medication:  1. INCREASE POTASSIUM 8 MEQ TO 2 TABLETS TWICE A DAY  2. START LOSARTAN 25 MG DAILY  If you need a refill on your cardiac medications before your next appointment, please call your pharmacy.   Lab work: TO BE DONE IN 2 WEEKS: BMET  YOU HAVE A LAB APPOINTMENT SCHEDULE FOR 12/31/2018 TO GET LFTS, LIPIDS  If you have labs (blood work) drawn today and your tests are completely normal, you will receive your results only by: Marland Kitchen MyChart Message (if you have MyChart) OR . A paper copy in the mail If you have any lab test that is abnormal or we need to change your treatment, we will call you to review the results.  Testing/Procedures: NONE  Follow-Up: At [facility], you and your health needs are our priority.  As part of our continuing mission to provide you with exceptional heart care, we have created designated Provider Care Teams.  These Care Teams include your primary Cardiologist (physician) and Advanced Practice Providers (APPs -  Physician Assistants and Nurse Practitioners) who all work together to provide you with the care you need, when you need it. You will need a follow up appointment in:  6 months.  Please call our office 2 months in advance to schedule this appointment.  You may see Sherren Mocha, MD or one of the following Advanced Practice Providers on your designated Care Team: Richardson Dopp, PA-C   Any Other Special Instructions Will Be Listed Below (If Applicable).

## 2018-11-07 NOTE — Progress Notes (Signed)
Hematology and Oncology Follow Up Visit  Douglas Christensen [id number] Mar 08, 1951 67 y.o. 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  We first saw him back in August.  At that time, I thought that he would likely have spurious polycythemia.  His evaluation showed an erythropoietin level of only 10.3.  His iron studies showed a ferritin of 63 with an iron saturation of 21%.  His B12 level was 1400.  His JAK 2 mutation was negative.  He does not want to donate blood for the [employer].  He says that when he has done this before, his blood pressure drops quite a bit.  He looks quite "ready" today.  His hematocrit is 50.3.  He is not smoking.  He is still working part-time.  He had a nice Thanksgiving.  Sounds like he will be going to the beach for [place].  There is been no fever.  He has had no cough.  He has had no nausea or vomiting.  Overall, his performance status is ECOG 1.  He does have sleep apnea.  He says he does use his CPAP machine.  Medications:  Current Outpatient Medications:  .  aspirin EC 81 MG tablet, Take 1 tablet (81 mg total) by mouth daily., Disp: 90 tablet, Rfl: 3 .  cholecalciferol (VITAMIN D) 1000 units tablet, Take 1,000 Units by mouth 2 (two) times daily. , Disp: , Rfl:  .  clonazePAM (KLONOPIN) 0.5 MG tablet, TAKE ONE TABLET TWICE DAILY AS NEEDED FOR ANXIETY, Disp: 60 tablet, Rfl: 3 .  hydrALAZINE (APRESOLINE) 50 MG tablet, TAKE 50 MG BY MOUTH THREE TIMES DAILY, Disp: 270 tablet, Rfl: 3 .  ofloxacin (OCUFLOX) 0.3 % ophthalmic solution, INSTILL ONE DROP IN THE AFFECTED EYE FOUR TIMES DAILY, START TWO DAYS PRIOR TO SURGERY, Disp: , Rfl: 0 .  OVER THE COUNTER MEDICATION, Take 1 capsule by mouth daily. "Nugenix" Natural Testosterone Booster, Disp: , Rfl:  .  potassium  chloride (KLOR-CON) 8 MEQ tablet, Take 1 tablet (8 mEq total) by mouth 2 (two) times daily., Disp: 60 tablet, Rfl: 5 .  prednisoLONE acetate (PRED FORTE) 1 % ophthalmic suspension, INSTILL ONE DROP IN THE AFFECTED EYE FOUR TIMES DAILY. START TWO DAY PRIOR TO SURGERY, Disp: , Rfl: 0 .  rosuvastatin (CRESTOR) 10 MG tablet, Take 1 tablet (10 mg total) by mouth daily., Disp: 90 tablet, Rfl: 3 .  tadalafil (CIALIS) 20 MG tablet, Take 1 tablet (20 mg total) by mouth daily as needed for erectile dysfunction., Disp: 10 tablet, Rfl: 1 .  tadalafil, PAH, (ADCIRCA) 20 MG tablet, Take 1 tablet (20 mg total) by mouth daily as needed for erectile dysfunction., Disp: , Rfl: 1 .  triamterene-hydrochlorothiazide (MAXZIDE-25) 37.5-25 MG tablet, Take 2 tablets by mouth daily., Disp: 60 tablet, Rfl: 11 .  vitamin B-12 (CYANOCOBALAMIN) 1000 MCG tablet, Take 1,000 mcg by mouth daily., Disp: , Rfl:  .  amLODipine (NORVASC) 10 MG tablet, Take 1 tablet (10 mg total) by mouth daily., Disp: 90 tablet, Rfl: 3  Current Facility-Administered Medications:  .  0.9 %  sodium chloride infusion, 500 mL, Intravenous, Once, Ladene Artist, MD  Allergies:  Allergies  Allergen Reactions  . Roxicodone [Oxycodone Hcl] Other (See Comments)  hallucinations  . Doxycycline     Nausea, able to tolerate 1 a day  . Lipitor [Atorvastatin Calcium]     cramps  . Benazepril Cough  . Itraconazole Nausea Only and Rash  . Plavix [Clopidogrel Bisulfate] Rash    Past Medical History, Surgical history, Social history, and Family History were reviewed and updated.  Review of Systems: Review of Systems  Constitutional: Negative.   HENT:  Negative.   Eyes: Negative.   Respiratory: Negative.   Cardiovascular: Negative.   Gastrointestinal: Negative.   Endocrine: Negative.   Genitourinary: Negative.    Musculoskeletal: Negative.   Skin: Negative.   Neurological: Negative.   Hematological: Negative.   Psychiatric/Behavioral: Negative.      Physical Exam:  weight is 202 lb 8 oz (91.9 kg). His oral temperature is 98.6 F (37 C). His blood pressure is 160/75 (abnormal) and his pulse is 98. His respiration is 20 and oxygen saturation is 99%.   Wt Readings from Last 3 Encounters:  11/05/18 202 lb 8 oz (91.9 kg)  07/13/18 197 lb (89.4 kg)  07/13/18 196 lb (88.9 kg)    Physical Exam  Constitutional: He is oriented to person, place, and time.  HENT:  Head: Normocephalic and atraumatic.  Mouth/Throat: Oropharynx is clear and moist.  Eyes: Pupils are equal, round, and reactive to light. EOM are normal.  Neck: Normal range of motion.  Cardiovascular: Normal rate, regular rhythm and normal heart sounds.  Pulmonary/Chest: Effort normal and breath sounds normal.  Abdominal: Soft. Bowel sounds are normal.  Musculoskeletal: Normal range of motion. He exhibits no edema, tenderness or deformity.  Lymphadenopathy:    He has no cervical adenopathy.  Neurological: He is alert and oriented to person, place, and time.  Skin: Skin is warm and dry. No rash noted. No erythema.  Psychiatric: He has a normal mood and affect. His behavior is normal. Judgment and thought content normal.  Vitals reviewed.    Lab Results  Component Value Date   WBC 8.4 11/05/2018   HGB 17.9 (H) 11/05/2018   HCT 50.3 11/05/2018   MCV 91.8 11/05/2018   PLT 235 11/05/2018     Chemistry      Component Value Date/Time   NA 137 11/05/2018 1353   NA 138 10/01/2018 1041   K 3.4 (L) 11/05/2018 1353   CL 100 11/05/2018 1353   CO2 26 11/05/2018 1353   BUN 16 11/05/2018 1353   BUN 13 10/01/2018 1041   CREATININE 0.97 11/05/2018 1353      Component Value Date/Time   CALCIUM 9.1 11/05/2018 1353   ALKPHOS 59 11/05/2018 1353   AST 29 11/05/2018 1353   ALT 34 11/05/2018 1353   BILITOT 0.6 11/05/2018 1353       Impression and Plan: Douglas Christensen is a 67 year old white male.  I still suspect that he has spurious polycythemia.  However, I think that given  his appearance, and the fact that his hematocrit is 50%, I really think we are going to have to do some phlebotomies on him.  I really think that with phlebotomize him, we can improve his circulation and improve his oxygen carrying capacity.  I talked to him about doing phlebotomies in the office.  I said that we would give him some IV fluid after a phlebotomy so that we would minimize his hypotension.  He is okay with this.  We will see about getting him back next week for another phlebotomy.  I will see him back after  the New Year's holiday.    Volanda Napoleon, MD 12/11/20197:19 AM

## 2018-11-07 NOTE — Progress Notes (Signed)
Cardiology Office Note:    Date:  11/07/2018   ID:  Douglas Christensen, DOB 06-Jun-1951, MRN [id number]  PCP:  Cassandria Anger, MD  Cardiologist:  Sherren Mocha, MD   Electrophysiologist:  None   Referring MD: Cassandria Anger, MD   Chief Complaint  Patient presents with  . Follow-up    CAD    History of Present Illness:    Douglas Christensen is a 67 y.o. male with CAD s/p CABG in 04/2013, PAD (s/p R SFA stent and L CIA stent in 2008, L EIA stent 2012, L EIA/CFA/PFA/SFA endarterectomy and L SFA stent in 3/16), second degree heart block type 1, HTN, HL, OSA, GERD, prior ETOH abuse. He developed 2-1 heart block in August 2018 that resolved with discontinuation of his beta-blocker.  He was seen in the emergency room January 2019 with palpitations and high blood pressure.  Electrocardiogram demonstrated sinus rhythm with transient Mobitz 1 and prolonged PR interval similar to prior ECGs.   He was seen in 12/2017 for worsening shortness of breath.  A Nuclear stress test demonstrated inferior, inferolateral and apical inferior defect that appeared suspicious for attenuation.  A follow-up echo demonstrated normal LV function with normal wall motion.  There was no significant ischemia.  The Holter monitor demonstrated sinus rhythm with an average heart rate of 72, 5% PVCs and periods of second-degree AV block type I as well as 2:1 heart block.  Clinical follow-up was recommended.    Recently he has seen Dr. Marin Olp for secondary polycythemia.  He underwent therapeutic phlebotomy earlier this week.   [name] returns for follow-up.  He is here alone.  Since his recent phlebotomy, he feels that his breathing has improved somewhat.  He denies chest discomfort.  He denies paroxysmal nocturnal dyspnea, syncope.  He does have some occasional lower extremity swelling, especially on the left.  Prior CV studies:   The following studies were reviewed today:  Holter 01/10/18 1. The basic rhythm is normal  sinus with an average heart rate of 72 bpm 2. There are frequent PVCs (5% burden) 3. There is second-degree type I AV block and periods of 2: 1 heart block  Echo 01/22/18 Mild LVH, EF 28-31, grade 2 diastolic dysfunction, mild RAE  Nuclear stress test 01/10/18 Inferior/inferolateral/apical inferior defect with minimal reversibility and anteroseptal wall-likely diaphragmatic attenuation; cannot rule out infarct with peri-infarct ischemia; sinus rhythm with PVCs, PACs and intermittent type I second-degree AV block which improved with increased heart rate; intermediate risk  Echo 07/2014 EF 55-60%, Gr 2 DD Mild BAE  LHC (6/14):  prox and mid LM 40-50%, prox LAD 70%, ostial Dx 75%, mid LAD 90-95%, prox CFX 60-70%, mid RCA occluded, EF 55-65% >>> CABG  Carotid US (1/14):  Bilateral ICA 1-39%  Carotid US (1/14):  Bilateral ICA 1-39%  Past Medical History:  Diagnosis Date  . Anxiety   . Arthritis   . CAD (coronary artery disease)    Mild plaque (cath "years ago"); abnormal Myoview 04/2013 with subsequent CABG x 5 with LIMA to LAD, SVG to OM1, SVG to DX, SVG to PD & PL.   . Cataract   . COPD (chronic obstructive pulmonary disease) ([place])   . GERD (gastroesophageal reflux disease)   . History of colonic polyps   . History of echocardiogram    Echo 2/19: Mild LVH, EF 60-65, normal wall motion, grade 2 diastolic dysfunction, mild RAE  . History of nuclear stress test    Myoview 2/19:  inf/inf-lat/apical inf/ant-sept defect (?diaph atten - cannot rule out peri-infarct ischemia), PVCs/PACs/Mobitz 1  . Hx of echocardiogram    Echo (9/15):  EF 55-60%; Gr 2 DD, mild BAE  . Hyperlipidemia   . Hypertension   . LBP (low back pain)   . Meralgia paresthetica of left side 2011  . PVD (peripheral vascular disease) ([place])    Stent to left common femoral and right superficial femoral.  2008.  50%  left renal   . Second degree AV block, Mobitz type I    Holter 2/19: Sinus rhythm, average  heart rate 72, frequent PVCs (burden 5%), second-degree type I AV block and periods of 2:1 heart block >> continue clinical managment and avoid AVN blocking agents  . Shortness of breath    "once in awhile; can happen at anytime" (08/26/2013)  . Sleep apnea    mod OSA, central sleep apnea/hypoapnea syndrome 11/22/12, CPAP every night   . Vitamin D deficiency    Surgical Hx: The patient  has a past surgical history that includes lower extremity stents; Femoral-popliteal Bypass Graft (12/27/2012); Cardiac catheterization (05/02/13); Coronary artery bypass graft (N/A, 05/06/2013); lower extremity angiogram (Bilateral, 11/16/2011); abdominal aortagram (N/A, 11/16/2011); percutaneous stent intervention (Left, 11/16/2011); abdominal aortagram (N/A, 11/14/2012); abdominal aortagram (N/A, 12/30/2014); Endarterectomy femoral (Left, 01/29/2015); Iliac atherectomy (Left, 01/29/2015); Tonsillectomy; Total hip arthroplasty (Left, 06/29/2015); and Eye surgery (03/24/16).   Current Medications: Current Meds  Medication Sig  . amLODipine (NORVASC) 10 MG tablet Take 1 tablet (10 mg total) by mouth daily.  Marland Kitchen aspirin EC 81 MG tablet Take 1 tablet (81 mg total) by mouth daily.  . cholecalciferol (VITAMIN D) 1000 units tablet Take 1,000 Units by mouth 2 (two) times daily.   . clonazePAM (KLONOPIN) 0.5 MG tablet TAKE ONE TABLET TWICE DAILY AS NEEDED FOR ANXIETY  . hydrALAZINE (APRESOLINE) 50 MG tablet TAKE 50 MG BY MOUTH THREE TIMES DAILY  . ofloxacin (OCUFLOX) 0.3 % ophthalmic solution INSTILL ONE DROP IN THE AFFECTED EYE FOUR TIMES DAILY, START TWO DAYS PRIOR TO SURGERY  . OVER THE COUNTER MEDICATION Take 1 capsule by mouth daily. "Nugenix" Natural Testosterone Booster  . prednisoLONE acetate (PRED FORTE) 1 % ophthalmic suspension INSTILL ONE DROP IN THE AFFECTED EYE FOUR TIMES DAILY. START TWO DAY PRIOR TO SURGERY  . rosuvastatin (CRESTOR) 10 MG tablet Take 1 tablet (10 mg total) by mouth daily.  . tadalafil (CIALIS) 20 MG  tablet Take 1 tablet (20 mg total) by mouth daily as needed for erectile dysfunction.  . triamterene-hydrochlorothiazide (MAXZIDE-25) 37.5-25 MG tablet Take 2 tablets by mouth daily.  . vitamin B-12 (CYANOCOBALAMIN) 1000 MCG tablet Take 1,000 mcg by mouth daily.  . [DISCONTINUED] potassium chloride (KLOR-CON) 8 MEQ tablet Take 1 tablet (8 mEq total) by mouth 2 (two) times daily.   Current Facility-Administered Medications for the 11/07/18 encounter (Office Visit) with Richardson Dopp T, PA-C  Medication  . 0.9 %  sodium chloride infusion     Allergies:   Roxicodone [oxycodone hcl]; Benazepril; Doxycycline; Itraconazole; Lipitor [atorvastatin calcium]; and Plavix [clopidogrel bisulfate]   Social History   Tobacco Use  . Smoking status: Former Smoker    Packs/day: 2.00    Years: 47.00    Pack years: 94.00    Types: Cigarettes    Last attempt to quit: 12/17/2012    Years since quitting: 5.8  . Smokeless tobacco: Never Used  Substance Use Topics  . Alcohol use: Yes    Alcohol/week: 4.0 standard drinks    Types:  4 Shots of liquor per week    Comment: 08/26/2013 "3-4 mixed drinks/wk"  . Drug use: No     Family Hx: The patient's family history includes Alzheimer's disease in his father; Cancer in his father and mother; Heart disease in his mother; Hyperlipidemia in his mother; Hypertension in his mother. There is no history of Colon cancer, Heart attack, Esophageal cancer, Liver cancer, Rectal cancer, Stomach cancer, or Pancreatic cancer.  ROS:   Please see the history of present illness.    Review of Systems  Cardiovascular: Positive for dyspnea on exertion.  Musculoskeletal: Positive for back pain.   All other systems reviewed and are negative.   EKGs/Labs/Other Test Reviewed:    EKG:  EKG is  ordered today.  The ekg ordered today demonstrates NSR, HR 93, 1st degree AVB, PR 256, ?2nd degree AVB, QTc 415, similar to old EKGs.  Recent Labs: 03/20/2018: TSH 2.60 11/05/2018: ALT 34;  BUN 16; Creatinine 0.97; Hemoglobin 17.9; Platelet Count 235; Potassium 3.4; Sodium 137   Recent Lipid Panel Lab Results  Component Value Date/Time   CHOL 199 10/01/2018 10:41 AM   TRIG 156 (H) 10/01/2018 10:41 AM   TRIG 118 09/25/2006 10:02 AM   HDL 40 10/01/2018 10:41 AM   CHOLHDL 5.0 10/01/2018 10:41 AM   CHOLHDL 5 06/07/2018 11:21 AM   LDLCALC 128 (H) 10/01/2018 10:41 AM   LDLDIRECT 148.0 06/07/2018 11:21 AM    Physical Exam:    VS:  BP (!) 144/60   Pulse (!) 104   Ht 5\' 5"  (1.651 m)   Wt 203 lb (92.1 kg)   SpO2 96%   BMI 33.78 kg/m     Wt Readings from Last 3 Encounters:  11/07/18 203 lb (92.1 kg)  11/05/18 202 lb 8 oz (91.9 kg)  07/13/18 197 lb (89.4 kg)     Physical Exam  Constitutional: He is oriented to person, place, and time. He appears well-developed and well-nourished. No distress.  HENT:  Head: Normocephalic and atraumatic.  Neck: Neck supple. No JVD present. No thyromegaly present.  Cardiovascular: Normal rate, regular rhythm, S1 normal and S2 normal.  No murmur heard. Pulmonary/Chest: Breath sounds normal. He has no rales.  Abdominal: Soft. There is no hepatomegaly.  Musculoskeletal: He exhibits no edema.  Lymphadenopathy:    He has no cervical adenopathy.  Neurological: He is alert and oriented to person, place, and time.  Skin: Skin is warm and dry.  Psychiatric: He has a normal mood and affect.    ASSESSMENT & PLAN:    Coronary artery disease involving native coronary artery of native heart without angina pectoris History of CABG in 2014.  Nuclear stress test in February 2019 without significant ischemia.  He is not having any anginal symptoms.  Continue aspirin, statin.  He is not on beta-blocker secondary to second-degree AV block type I.  Essential hypertension Blood pressure remains above target.  We discussed the possibility of increasing his hydralazine or starting losartan.  I prefer to start losartan.  He had a cough with ACE inhibitor  in the past.  -Start losartan 25 mg daily  -Obtain a BMET in 2 weeks.  Hyperlipidemia, unspecified hyperlipidemia type Lipid control suboptimal on recent blood work.  Therefore, I changed him to rosuvastatin.  Follow-up lipids, LFTs will be obtained in 2 to 3 months.  PAOD (peripheral arterial occlusive disease) ([place]) Continue follow-up with Dr. Trula Slade as planned.  Mobitz type 1 second degree atrioventricular block He is somewhat tachycardic today on  exam.  Follow-up ECG demonstrates sinus rhythm with 1st degree AVB.  We discussed the warning symptoms that would indicate significant bradycardia.    Hypokalemia Follow-up labs with hematology 2 days ago demonstrated a potassium of 3.4 and a creatinine of 0.97.  I will increase his potassium to 16 mEq twice daily.  Follow-up BMET in 2 weeks.   Dispo:  Return in about 6 months (around 05/09/2019) for Routine Follow Up, w/ Dr. Burt Knack, or Richardson Dopp, PA-C.   Medication Adjustments/Labs and Tests Ordered: Current medicines are reviewed at length with the patient today.  Concerns regarding medicines are outlined above.  Tests Ordered: Orders Placed This Encounter  Procedures  . Basic metabolic panel   Medication Changes: Meds ordered this encounter  Medications  . Potassium Chloride CR (MICRO-K) 8 MEQ CPCR capsule CR    Sig: Take 2 capsules (16 mEq total) by mouth 2 (two) times daily.    Dispense:  120 capsule    Refill:  11  . losartan (COZAAR) 25 MG tablet    Sig: Take 1 tablet (25 mg total) by mouth daily.    Dispense:  90 tablet    Refill:  3    Signed, Richardson Dopp, PA-C  11/07/2018 11:38 AM    [facility] [place], [place], [place]  [address] Phone: [phone]; Fax: [phone]

## 2018-11-07 NOTE — Addendum Note (Signed)
Addended by: Briant Cedar on: 11/07/2018 11:50 AM   Modules accepted: Orders

## 2018-11-12 ENCOUNTER — Ambulatory Visit (INDEPENDENT_AMBULATORY_CARE_PROVIDER_SITE_OTHER): Payer: PPO | Admitting: Internal Medicine

## 2018-11-12 ENCOUNTER — Other Ambulatory Visit: Payer: Self-pay | Admitting: Family

## 2018-11-12 ENCOUNTER — Encounter: Payer: Self-pay | Admitting: Internal Medicine

## 2018-11-12 ENCOUNTER — Inpatient Hospital Stay: Payer: PPO

## 2018-11-12 VITALS — BP 147/71 | HR 77 | Resp 18

## 2018-11-12 DIAGNOSIS — H35372 Puckering of macula, left eye: Secondary | ICD-10-CM | POA: Diagnosis not present

## 2018-11-12 DIAGNOSIS — D751 Secondary polycythemia: Secondary | ICD-10-CM

## 2018-11-12 DIAGNOSIS — I2510 Atherosclerotic heart disease of native coronary artery without angina pectoris: Secondary | ICD-10-CM

## 2018-11-12 DIAGNOSIS — Z09 Encounter for follow-up examination after completed treatment for conditions other than malignant neoplasm: Secondary | ICD-10-CM | POA: Diagnosis not present

## 2018-11-12 DIAGNOSIS — E538 Deficiency of other specified B group vitamins: Secondary | ICD-10-CM | POA: Diagnosis not present

## 2018-11-12 DIAGNOSIS — I10 Essential (primary) hypertension: Secondary | ICD-10-CM | POA: Diagnosis not present

## 2018-11-12 DIAGNOSIS — J209 Acute bronchitis, unspecified: Secondary | ICD-10-CM | POA: Diagnosis not present

## 2018-11-12 DIAGNOSIS — J441 Chronic obstructive pulmonary disease with (acute) exacerbation: Secondary | ICD-10-CM

## 2018-11-12 MED ORDER — CEFDINIR 300 MG PO CAPS: 300.0000 mg | ORAL_CAPSULE | Freq: Two times a day (BID) | ORAL | 0 refills | Status: DC

## 2018-11-12 MED ORDER — SODIUM CHLORIDE 0.9 % IV SOLN
Freq: Once | INTRAVENOUS | Status: AC
  Administered 2018-11-12: 15:00:00 via INTRAVENOUS
  Filled 2018-11-12: qty 250

## 2018-11-12 MED ORDER — METHYLPREDNISOLONE 4 MG PO TBPK: ORAL_TABLET | ORAL | 0 refills | Status: DC

## 2018-11-12 NOTE — Assessment & Plan Note (Signed)
Cefdinir, Medrol dosepac

## 2018-11-12 NOTE — Progress Notes (Signed)
Subjective:  Patient ID: Douglas Christensen, male    DOB: 1951-08-14  Age: 67 y.o. MRN: [id number]  CC: No chief complaint on file.   HPI Douglas Christensen presents for URI sx's x 1 wk F/u CAD, polycythemia, anxiety f/u  Outpatient Medications Prior to Visit  Medication Sig Dispense Refill  . aspirin EC 81 MG tablet Take 1 tablet (81 mg total) by mouth daily. 90 tablet 3  . cholecalciferol (VITAMIN D) 1000 units tablet Take 1,000 Units by mouth 2 (two) times daily.     . clonazePAM (KLONOPIN) 0.5 MG tablet TAKE ONE TABLET TWICE DAILY AS NEEDED FOR ANXIETY 60 tablet 3  . hydrALAZINE (APRESOLINE) 50 MG tablet TAKE 50 MG BY MOUTH THREE TIMES DAILY 270 tablet 3  . losartan (COZAAR) 25 MG tablet Take 1 tablet (25 mg total) by mouth daily. 90 tablet 3  . ofloxacin (OCUFLOX) 0.3 % ophthalmic solution INSTILL ONE DROP IN THE AFFECTED EYE FOUR TIMES DAILY, START TWO DAYS PRIOR TO SURGERY  0  . OVER THE COUNTER MEDICATION Take 1 capsule by mouth daily. "Nugenix" Natural Testosterone Booster    . Potassium Chloride CR (MICRO-K) 8 MEQ CPCR capsule CR Take 2 capsules (16 mEq total) by mouth 2 (two) times daily. 120 capsule 11  . prednisoLONE acetate (PRED FORTE) 1 % ophthalmic suspension INSTILL ONE DROP IN THE AFFECTED EYE FOUR TIMES DAILY. START TWO DAY PRIOR TO SURGERY  0  . rosuvastatin (CRESTOR) 10 MG tablet Take 1 tablet (10 mg total) by mouth daily. 90 tablet 3  . tadalafil (CIALIS) 20 MG tablet Take 1 tablet (20 mg total) by mouth daily as needed for erectile dysfunction. 10 tablet 1  . triamterene-hydrochlorothiazide (MAXZIDE-25) 37.5-25 MG tablet Take 2 tablets by mouth daily. 60 tablet 11  . vitamin B-12 (CYANOCOBALAMIN) 1000 MCG tablet Take 1,000 mcg by mouth daily.    Marland Kitchen amLODipine (NORVASC) 10 MG tablet Take 1 tablet (10 mg total) by mouth daily. 90 tablet 3   Facility-Administered Medications Prior to Visit  Medication Dose Route Frequency Provider Last Rate Last Dose  . 0.9 %  sodium chloride  infusion  500 mL Intravenous Once Ladene Artist, MD        ROS: Review of Systems  Constitutional: Negative for appetite change, fatigue and unexpected weight change.  HENT: Positive for congestion, postnasal drip, rhinorrhea, sinus pressure and sinus pain. Negative for nosebleeds, sneezing, sore throat and trouble swallowing.   Eyes: Negative for itching and visual disturbance.  Respiratory: Positive for cough and wheezing.   Cardiovascular: Negative for chest pain, palpitations and leg swelling.  Gastrointestinal: Negative for abdominal distention, blood in stool, diarrhea and nausea.  Genitourinary: Negative for frequency and hematuria.  Musculoskeletal: Negative for back pain, gait problem, joint swelling and neck pain.  Skin: Negative for rash.  Neurological: Negative for dizziness, tremors, speech difficulty and weakness.  Psychiatric/Behavioral: Negative for agitation, dysphoric mood and sleep disturbance. The patient is not nervous/anxious.     Objective:  BP 132/62 (BP Location: Right Arm, Patient Position: Sitting, Cuff Size: Large)   Pulse 75   Temp 98.2 F (36.8 C) (Oral)   Ht 5\' 5"  (1.651 m)   Wt 206 lb (93.4 kg)   SpO2 96%   BMI 34.28 kg/m   BP Readings from Last 3 Encounters:  11/12/18 132/62  11/07/18 (!) 144/60  11/05/18 (!) 144/70    Wt Readings from Last 3 Encounters:  11/12/18 206 lb (93.4 kg)  11/07/18  203 lb (92.1 kg)  11/05/18 202 lb 8 oz (91.9 kg)    Physical Exam Constitutional:      General: He is not in acute distress.    Appearance: He is well-developed.     Comments: NAD  HENT:     Nose: Congestion and rhinorrhea present.     Mouth/Throat:     Pharynx: Posterior oropharyngeal erythema present.  Eyes:     Conjunctiva/sclera: Conjunctivae normal.     Pupils: Pupils are equal, round, and reactive to light.  Neck:     Musculoskeletal: Normal range of motion.     Thyroid: No thyromegaly.     Vascular: No JVD.  Cardiovascular:      Rate and Rhythm: Normal rate and regular rhythm.     Heart sounds: Normal heart sounds. No murmur. No friction rub. No gallop.   Pulmonary:     Effort: Pulmonary effort is normal. No respiratory distress.     Breath sounds: Normal breath sounds. No wheezing or rales.  Chest:     Chest wall: No tenderness.  Abdominal:     General: Bowel sounds are normal. There is no distension.     Palpations: Abdomen is soft. There is no mass.     Tenderness: There is no abdominal tenderness. There is no guarding or rebound.  Musculoskeletal: Normal range of motion.        General: No tenderness.  Lymphadenopathy:     Cervical: No cervical adenopathy.  Skin:    General: Skin is warm and dry.     Findings: No rash.  Neurological:     Mental Status: He is alert and oriented to person, place, and time.     Cranial Nerves: No cranial nerve deficit.     Motor: No abnormal muscle tone.     Coordination: Coordination normal.     Gait: Gait normal.     Deep Tendon Reflexes: Reflexes are normal and symmetric.  Psychiatric:        Behavior: Behavior normal.        Thought Content: Thought content normal.        Judgment: Judgment normal.   coughing  Lab Results  Component Value Date   WBC 8.4 11/05/2018   HGB 17.9 (H) 11/05/2018   HCT 50.3 11/05/2018   PLT 235 11/05/2018   GLUCOSE 136 (H) 11/05/2018   CHOL 199 10/01/2018   TRIG 156 (H) 10/01/2018   HDL 40 10/01/2018   LDLDIRECT 148.0 06/07/2018   LDLCALC 128 (H) 10/01/2018   ALT 34 11/05/2018   AST 29 11/05/2018   NA 137 11/05/2018   K 3.4 (L) 11/05/2018   CL 100 11/05/2018   CREATININE 0.97 11/05/2018   BUN 16 11/05/2018   CO2 26 11/05/2018   TSH 2.60 03/20/2018   PSA 0.87 03/06/2017   INR 1.10 06/22/2015   HGBA1C 5.7 03/06/2017    Vas [place] Abi With/wo Tbi  Result Date: 05/16/2018 LOWER EXTREMITY DOPPLER STUDY High Risk Factors: Hypertension, hyperlipidemia, past history of smoking.  Vascular Interventions: History of right SFA stent  2008 (occluded). Right                         femoral to [place] popliteal bypass 12/11/2012. Left EIA, CFA,                         Profunda and SFA endarterectomy/angioplasty and left  superficial femoral stent placed 01/29/2015. Comparison Study: Right ABI has decreased since exam of 05/10/2017 Performing Technologist: Alvia Grove RVT  Examination Guidelines: A complete evaluation includes B-mode imaging, spectral Doppler, color Doppler, and power Doppler as needed of all accessible portions of each vessel. Bilateral testing is considered an integral part of a complete examination. Limited examinations for reoccurring indications may be performed as noted.  ABI Findings: +---------+------------------+-----+--------+--------+ Right    Rt Pressure (mmHg)IndexWaveformComment  +---------+------------------+-----+--------+--------+ Brachial 151                                     +---------+------------------+-----+--------+--------+ PTA      124               0.74 biphasic         +---------+------------------+-----+--------+--------+ DP       115               0.68 biphasic         +---------+------------------+-----+--------+--------+ Theodoro Parma               0.67 Abnormal         +---------+------------------+-----+--------+--------+ +---------+------------------+-----+---------+-------+ Left     Lt Pressure (mmHg)IndexWaveform Comment +---------+------------------+-----+---------+-------+ Brachial 168                                     +---------+------------------+-----+---------+-------+ PTA      163               0.97 triphasic        +---------+------------------+-----+---------+-------+ DP       186               1.11 triphasic        +---------+------------------+-----+---------+-------+ Great Toe150               0.89 Normal           +---------+------------------+-----+---------+-------+  +-------+-----------+-----------+------------+------------+ ABI/TBIToday's ABIToday's TBIPrevious ABIPrevious TBI +-------+-----------+-----------+------------+------------+ Right  0.74       0.67       1.00        0.83         +-------+-----------+-----------+------------+------------+ Left   1.11       0.89       1.06        0.81         +-------+-----------+-----------+------------+------------+  Final Interpretation: Right: Resting right ankle-brachial index indicates moderate right lower extremity arterial disease. The right toe-brachial index is abnormal. Left: Resting left ankle-brachial index is within normal range. No evidence of significant left lower extremity arterial disease. The left toe-brachial index is normal.  *See table(s) above for measurements and observations.  Electronically signed by Deitra Mayo MD on 05/16/2018 at 1:44:21 PM.    Final    Vas [place] Lower Extremity Bypass Graft Dupl  Result Date: 05/15/2018 LOWER EXTREMITY ARTERIAL DUPLEX STUDY High Risk Factors: Hypertension, hyperlipidemia, past history of smoking.  Vascular Interventions: Right SFA stent 2008 (known occlusion). Right femoral to                         [place] popliteal bypass 12/11/2012. Left EIA, CFA, PFA and                         SFA endarterectomy / angioplasty and  left superficial                         femoral artery stent 01/29/2015. Current ABI:            right 0.74 Left 1.11 Performing Technologist: Alvia Grove  Examination Guidelines: A complete evaluation includes B-mode imaging, spectral Doppler, color Doppler, and power Doppler as needed of all accessible portions of each vessel. Bilateral testing is considered an integral part of a complete examination. Limited examinations for reoccurring indications may be performed as noted.  Right Duplex Findings: +----------+--------+-----+--------+--------+--------+           PSV cm/sRatioStenosisWaveformComments  +----------+--------+-----+--------+--------+--------+ EIA [id number]                  biphasic         +----------+--------+-----+--------+--------+--------+ CFA Prox  212                  biphasicdamp     +----------+--------+-----+--------+--------+--------+  Right Graft: +------------------+--------+--------+----------+--------+                   PSV cm/sStenosisWaveform  Comments +------------------+--------+--------+----------+--------+ Inflow            212             biphasic           +------------------+--------+--------+----------+--------+ Prox anastomosis  29              monophasic         +------------------+--------+--------+----------+--------+ Proximal graft    22              monophasic         +------------------+--------+--------+----------+--------+ Mid graft         50              biphasic  dampened +------------------+--------+--------+----------+--------+ Distal graft      46              monophasic         +------------------+--------+--------+----------+--------+ Distal anastomosis39              biphasic           +------------------+--------+--------+----------+--------+ Outflow           39 / 229        monophasic         +------------------+--------+--------+----------+--------+ Left Duplex Findings: +----------+--------+-----+--------+--------+--------+           PSV cm/sRatioStenosisWaveformComments +----------+--------+-----+--------+--------+--------+ SFA Distal75                                    +----------+--------+-----+--------+--------+--------+  Left Stent(s): +---------------+---++--------++ Prox to Stent  223biphasic +---------------+---++--------++ Proximal Stent 232biphasic +---------------+---++--------++ Mid Stent      165biphasic +---------------+---++--------++ Distal Stent   166biphasic +---------------+---++--------++ Distal to Stent97 biphasic  +---------------+---++--------++  Final Interpretation: Right: The femoral to popliteal bypass appears patent throughout        with no visualized stenosis but with dampened flow. Increased        velocity in the distal EIA suggestive of > 50% stenosis,        Increased velocity distal to the bypass in the 50 - 74%        stenosis range. limited visualization. Left: Patent left superficial femoral artery stent with elevated       velocities proximal to and in the proximal stent suggestive of  50 - 74 stenosis (probable lower end of range) by velocity       criteria, unable to visualized plaque. *See table(s) above for measurements and observations.  Electronically signed by Harold Barban MD on 05/15/2018 at 12:54:39 PM.   Final     Assessment & Plan:   There are no diagnoses linked to this encounter.   No orders of the defined types were placed in this encounter.    Follow-up: No follow-ups on file.  [name], MD

## 2018-11-12 NOTE — Assessment & Plan Note (Signed)
On B12 

## 2018-11-12 NOTE — Progress Notes (Unsigned)
Douglas Christensen presents today for phlebotomy per MD orders. Phlebotomy procedure started at 1414 and ended at 1635 with  540 grams removed via 20 G to RAC. Patient observed for 30 minutes after procedure without any incident. Patient tolerated procedure well. Diet and nutriton offered

## 2018-11-12 NOTE — Assessment & Plan Note (Signed)
On phlebotomies 

## 2018-11-12 NOTE — Assessment & Plan Note (Signed)
On Hydralazine, Amlodipine, Maxzide Phlebotomy

## 2018-11-12 NOTE — Assessment & Plan Note (Signed)
Lipitor, ASA 

## 2018-11-20 ENCOUNTER — Other Ambulatory Visit: Payer: PPO | Admitting: *Deleted

## 2018-11-20 DIAGNOSIS — I779 Disorder of arteries and arterioles, unspecified: Secondary | ICD-10-CM

## 2018-11-20 DIAGNOSIS — I441 Atrioventricular block, second degree: Secondary | ICD-10-CM | POA: Diagnosis not present

## 2018-11-20 DIAGNOSIS — I10 Essential (primary) hypertension: Secondary | ICD-10-CM | POA: Diagnosis not present

## 2018-11-20 DIAGNOSIS — I2510 Atherosclerotic heart disease of native coronary artery without angina pectoris: Secondary | ICD-10-CM

## 2018-11-20 LAB — BASIC METABOLIC PANEL
BUN/Creatinine Ratio: 20 (ref 10–24)
BUN: 21 mg/dL (ref 8–27)
CO2: 21 mmol/L (ref 20–29)
CREATININE: 1.05 mg/dL (ref 0.76–1.27)
Calcium: 9.5 mg/dL (ref 8.6–10.2)
Chloride: 98 mmol/L (ref 96–106)
GFR calc Af Amer: 84 mL/min/{1.73_m2} (ref >59)
GFR calc non Af Amer: 73 mL/min/{1.73_m2} (ref >59)
Glucose: 189 mg/dL — ABNORMAL HIGH (ref 65–99)
Potassium: 3.6 mmol/L (ref 3.5–5.2)
Sodium: 136 mmol/L (ref 134–144)

## 2018-11-26 ENCOUNTER — Telehealth: Payer: Self-pay | Admitting: *Deleted

## 2018-11-26 NOTE — Telephone Encounter (Signed)
DPR Left message lab work ok. Follow up with PCP for elevated glucose levels. If any questions please call [phone].

## 2018-11-26 NOTE — Telephone Encounter (Signed)
-----   Message from Liliane Shi, PA-C sent at 11/22/2018  8:30 AM EST ----- Renal function, K+ normal.  Glucose elevated. Recommendations:  - Continue current medications and follow up as planned.   - Follow up with PCP for elevated glucose.  Richardson Dopp, PA-C    11/22/2018 8:28 AM

## 2018-12-07 ENCOUNTER — Encounter (HOSPITAL_COMMUNITY): Payer: Self-pay | Admitting: *Deleted

## 2018-12-07 ENCOUNTER — Emergency Department (HOSPITAL_COMMUNITY)
Admission: EM | Admit: 2018-12-07 | Discharge: 2018-12-07 | Disposition: A | Discharge status: Home / Self Care | Payer: PPO | Attending: Emergency Medicine | Admitting: Emergency Medicine

## 2018-12-07 ENCOUNTER — Other Ambulatory Visit: Payer: Self-pay

## 2018-12-07 DIAGNOSIS — I10 Essential (primary) hypertension: Secondary | ICD-10-CM | POA: Insufficient documentation

## 2018-12-07 DIAGNOSIS — Z87891 Personal history of nicotine dependence: Secondary | ICD-10-CM | POA: Diagnosis not present

## 2018-12-07 DIAGNOSIS — Z96642 Presence of left artificial hip joint: Secondary | ICD-10-CM | POA: Diagnosis not present

## 2018-12-07 DIAGNOSIS — I2510 Atherosclerotic heart disease of native coronary artery without angina pectoris: Secondary | ICD-10-CM | POA: Insufficient documentation

## 2018-12-07 DIAGNOSIS — Z79899 Other long term (current) drug therapy: Secondary | ICD-10-CM | POA: Diagnosis not present

## 2018-12-07 DIAGNOSIS — Z7982 Long term (current) use of aspirin: Secondary | ICD-10-CM | POA: Diagnosis not present

## 2018-12-07 DIAGNOSIS — Z951 Presence of aortocoronary bypass graft: Secondary | ICD-10-CM | POA: Diagnosis not present

## 2018-12-07 DIAGNOSIS — J449 Chronic obstructive pulmonary disease, unspecified: Secondary | ICD-10-CM | POA: Diagnosis not present

## 2018-12-07 DIAGNOSIS — R42 Dizziness and giddiness: Secondary | ICD-10-CM | POA: Diagnosis present

## 2018-12-07 LAB — CBC WITH DIFFERENTIAL/PLATELET
Abs Immature Granulocytes: 0.03 10*3/uL (ref 0.00–0.07)
Basophils Absolute: 0 10*3/uL (ref 0.0–0.1)
Basophils Relative: 1 %
Eosinophils Absolute: 0.1 10*3/uL (ref 0.0–0.5)
Eosinophils Relative: 2 %
HCT: 41.8 % (ref 39.0–52.0)
Hemoglobin: 15 g/dL (ref 13.0–17.0)
Immature Granulocytes: 0 %
LYMPHS PCT: 29 %
Lymphs Abs: 2.1 10*3/uL (ref 0.7–4.0)
MCH: 33.9 pg (ref 26.0–34.0)
MCHC: 35.9 g/dL (ref 30.0–36.0)
MCV: 94.6 fL (ref 80.0–100.0)
Monocytes Absolute: 0.7 10*3/uL (ref 0.1–1.0)
Monocytes Relative: 9 %
Neutro Abs: 4.2 10*3/uL (ref 1.7–7.7)
Neutrophils Relative %: 59 %
Platelets: 233 10*3/uL (ref 150–400)
RBC: 4.42 MIL/uL (ref 4.22–5.81)
RDW: 13.5 % (ref 11.5–15.5)
WBC: 7.2 10*3/uL (ref 4.0–10.5)
nRBC: 0 % (ref 0.0–0.2)

## 2018-12-07 LAB — BASIC METABOLIC PANEL
Anion gap: 12 (ref 5–15)
BUN: 18 mg/dL (ref 8–23)
CO2: 21 mmol/L — AB (ref 22–32)
Calcium: 9.7 mg/dL (ref 8.9–10.3)
Chloride: 103 mmol/L (ref 98–111)
Creatinine, Ser: 1.04 mg/dL (ref 0.61–1.24)
GFR calc Af Amer: >60 mL/min (ref >60)
GFR calc non Af Amer: >60 mL/min (ref >60)
Glucose, Bld: 102 mg/dL — ABNORMAL HIGH (ref 70–99)
Potassium: 3.4 mmol/L — ABNORMAL LOW (ref 3.5–5.1)
Sodium: 136 mmol/L (ref 135–145)

## 2018-12-07 LAB — TROPONIN I: Troponin I: <0.03 ng/mL (ref <0.03)

## 2018-12-07 MED ORDER — HYDRALAZINE HCL 25 MG PO TABS
50.0000 mg | ORAL_TABLET | Freq: Once | ORAL | Status: AC
  Administered 2018-12-07: 50 mg via ORAL
  Filled 2018-12-07: qty 2

## 2018-12-07 NOTE — ED Notes (Signed)
Unable to locate x 1  
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
## 2018-12-07 NOTE — ED Triage Notes (Signed)
Pt was at a basketball game, felt "swimmy headed" and had the medics take his bp. Two different readings were over 660 systolic. Hx of 5 bypasses, reports compliance with medications. Denies CP, sob, is having the swimmy headed feeling

## 2018-12-10 ENCOUNTER — Inpatient Hospital Stay: Payer: PPO | Attending: Hematology & Oncology | Admitting: Hematology & Oncology

## 2018-12-10 ENCOUNTER — Inpatient Hospital Stay: Payer: PPO

## 2018-12-10 ENCOUNTER — Telehealth: Payer: Self-pay | Admitting: Hematology & Oncology

## 2018-12-10 ENCOUNTER — Encounter: Payer: Self-pay | Admitting: Hematology & Oncology

## 2018-12-10 ENCOUNTER — Other Ambulatory Visit: Payer: Self-pay

## 2018-12-10 VITALS — BP 130/50 | HR 73 | Temp 98.3°F | Resp 20 | Wt 203.0 lb

## 2018-12-10 DIAGNOSIS — I440 Atrioventricular block, first degree: Secondary | ICD-10-CM | POA: Insufficient documentation

## 2018-12-10 DIAGNOSIS — Z720 Tobacco use: Secondary | ICD-10-CM | POA: Diagnosis not present

## 2018-12-10 DIAGNOSIS — D751 Secondary polycythemia: Secondary | ICD-10-CM | POA: Diagnosis not present

## 2018-12-10 DIAGNOSIS — G4730 Sleep apnea, unspecified: Secondary | ICD-10-CM | POA: Diagnosis not present

## 2018-12-10 LAB — CBC WITH DIFFERENTIAL (CANCER CENTER ONLY)
Abs Immature Granulocytes: 0.03 10*3/uL (ref 0.00–0.07)
Basophils Absolute: 0.1 10*3/uL (ref 0.0–0.1)
Basophils Relative: 1 %
Eosinophils Absolute: 0.2 10*3/uL (ref 0.0–0.5)
Eosinophils Relative: 3 %
HCT: 40.8 % (ref 39.0–52.0)
HEMOGLOBIN: 14.4 g/dL (ref 13.0–17.0)
IMMATURE GRANULOCYTES: 0 %
LYMPHS PCT: 23 %
Lymphs Abs: 1.7 10*3/uL (ref 0.7–4.0)
MCH: 33.4 pg (ref 26.0–34.0)
MCHC: 35.3 g/dL (ref 30.0–36.0)
MCV: 94.7 fL (ref 80.0–100.0)
Monocytes Absolute: 0.7 10*3/uL (ref 0.1–1.0)
Monocytes Relative: 10 %
Neutro Abs: 4.7 10*3/uL (ref 1.7–7.7)
Neutrophils Relative %: 63 %
Platelet Count: 231 10*3/uL (ref 150–400)
RBC: 4.31 MIL/uL (ref 4.22–5.81)
RDW: 13.1 % (ref 11.5–15.5)
WBC Count: 7.4 10*3/uL (ref 4.0–10.5)
nRBC: 0 % (ref 0.0–0.2)

## 2018-12-10 LAB — CMP (CANCER CENTER ONLY)
ALK PHOS: 45 U/L (ref 38–126)
ALT: 34 U/L (ref 0–44)
AST: 33 U/L (ref 15–41)
Albumin: 4.4 g/dL (ref 3.5–5.0)
Anion gap: 9 (ref 5–15)
BUN: 24 mg/dL — ABNORMAL HIGH (ref 8–23)
CALCIUM: 9.3 mg/dL (ref 8.9–10.3)
CO2: 25 mmol/L (ref 22–32)
Chloride: 100 mmol/L (ref 98–111)
Creatinine: 1.23 mg/dL (ref 0.61–1.24)
GFR, Est AFR Am: >60 mL/min (ref >60)
GFR, Estimated: >60 mL/min (ref >60)
Glucose, Bld: 178 mg/dL — ABNORMAL HIGH (ref 70–99)
Potassium: 4 mmol/L (ref 3.5–5.1)
Sodium: 134 mmol/L — ABNORMAL LOW (ref 135–145)
Total Bilirubin: 0.6 mg/dL (ref 0.3–1.2)
Total Protein: 6.8 g/dL (ref 6.5–8.1)

## 2018-12-10 NOTE — Telephone Encounter (Signed)
Appointments scheduled letter/calendar mailed per 1/13 los

## 2018-12-10 NOTE — Progress Notes (Signed)
Hematology and Oncology Follow Up Visit  Douglas Christensen [id number] 1951/08/08 68 y.o. 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 is back for follow-up.  He actually is doing quite well.  He really enjoyed the holiday season.  [place] for [employer].  He did little bit of fishing while he was down there.  He really has had no problems since we saw him.  He is using his CPAP.  He absolutely loves using his CPAP.  His iron studies back in August showed a ferritin of 63 with an iron saturation of 21%.  I do not think he has been phlebotomized for a couple of months.  He has done really well.  He has had no issues with smoking.  He has had no issues with drinking.  He is still working part-time.  Overall, his performance status is ECOG 0.  Medications:  Current Outpatient Medications:  .  amLODipine (NORVASC) 10 MG tablet, Take 1 tablet (10 mg total) by mouth daily., Disp: 90 tablet, Rfl: 3 .  aspirin EC 81 MG tablet, Take 1 tablet (81 mg total) by mouth daily., Disp: 90 tablet, Rfl: 3 .  cefdinir (OMNICEF) 300 MG capsule, Take 1 capsule (300 mg total) by mouth 2 (two) times daily., Disp: 20 capsule, Rfl: 0 .  cholecalciferol (VITAMIN D) 1000 units tablet, Take 1,000 Units by mouth 2 (two) times daily. , Disp: , Rfl:  .  clonazePAM (KLONOPIN) 0.5 MG tablet, TAKE ONE TABLET TWICE DAILY AS NEEDED FOR ANXIETY, Disp: 60 tablet, Rfl: 3 .  hydrALAZINE (APRESOLINE) 50 MG tablet, TAKE 50 MG BY MOUTH THREE TIMES DAILY, Disp: 270 tablet, Rfl: 3 .  losartan (COZAAR) 25 MG tablet, Take 1 tablet (25 mg total) by mouth daily., Disp: 90 tablet, Rfl: 3 .  methylPREDNISolone (MEDROL DOSEPAK) 4 MG TBPK tablet, As directed, Disp: 21 tablet, Rfl: 0 .  ofloxacin (OCUFLOX) 0.3 % ophthalmic solution, INSTILL ONE DROP IN THE AFFECTED EYE FOUR  TIMES DAILY, START TWO DAYS PRIOR TO SURGERY, Disp: , Rfl: 0 .  OVER THE COUNTER MEDICATION, Take 1 capsule by mouth daily. "Nugenix" Natural Testosterone Booster, Disp: , Rfl:  .  Potassium Chloride CR (MICRO-K) 8 MEQ CPCR capsule CR, Take 2 capsules (16 mEq total) by mouth 2 (two) times daily., Disp: 120 capsule, Rfl: 11 .  prednisoLONE acetate (PRED FORTE) 1 % ophthalmic suspension, INSTILL ONE DROP IN THE AFFECTED EYE FOUR TIMES DAILY. START TWO DAY PRIOR TO SURGERY, Disp: , Rfl: 0 .  rosuvastatin (CRESTOR) 10 MG tablet, Take 1 tablet (10 mg total) by mouth daily., Disp: 90 tablet, Rfl: 3 .  tadalafil (CIALIS) 20 MG tablet, Take 1 tablet (20 mg total) by mouth daily as needed for erectile dysfunction., Disp: 10 tablet, Rfl: 1 .  triamterene-hydrochlorothiazide (MAXZIDE-25) 37.5-25 MG tablet, Take 2 tablets by mouth daily., Disp: 60 tablet, Rfl: 11 .  vitamin B-12 (CYANOCOBALAMIN) 1000 MCG tablet, Take 1,000 mcg by mouth daily., Disp: , Rfl:   Current Facility-Administered Medications:  .  0.9 %  sodium chloride infusion, 500 mL, Intravenous, Once, Ladene Artist, MD  Allergies:  Allergies  Allergen Reactions  . Roxicodone [Oxycodone Hcl] Other (See Comments)    hallucinations  . Benazepril Cough  . Doxycycline  Nausea, able to tolerate 1 a day  . Itraconazole Nausea Only and Rash  . Lipitor [Atorvastatin Calcium]     cramps  . Plavix [Clopidogrel Bisulfate] Rash    Past Medical History, Surgical history, Social history, and Family History were reviewed and updated.  Review of Systems: Review of Systems  Constitutional: Negative.   HENT:  Negative.   Eyes: Negative.   Respiratory: Negative.   Cardiovascular: Negative.   Gastrointestinal: Negative.   Endocrine: Negative.   Genitourinary: Negative.    Musculoskeletal: Negative.   Skin: Negative.   Neurological: Negative.   Hematological: Negative.   Psychiatric/Behavioral: Negative.     Physical Exam:  weight is  203 lb (92.1 kg). His oral temperature is 98.3 F (36.8 C). His blood pressure is 130/50 (abnormal) and his pulse is 73. His respiration is 20 and oxygen saturation is 100%.   Wt Readings from Last 3 Encounters:  12/10/18 203 lb (92.1 kg)  11/12/18 206 lb (93.4 kg)  11/07/18 203 lb (92.1 kg)    Physical Exam Vitals signs reviewed.  HENT:     Head: Normocephalic and atraumatic.  Eyes:     Pupils: Pupils are equal, round, and reactive to light.  Neck:     Musculoskeletal: Normal range of motion.  Cardiovascular:     Rate and Rhythm: Normal rate and regular rhythm.     Heart sounds: Normal heart sounds.  Pulmonary:     Effort: Pulmonary effort is normal.     Breath sounds: Normal breath sounds.  Abdominal:     General: Bowel sounds are normal.     Palpations: Abdomen is soft.  Musculoskeletal: Normal range of motion.        General: No tenderness or deformity.  Lymphadenopathy:     Cervical: No cervical adenopathy.  Skin:    General: Skin is warm and dry.     Findings: No erythema or rash.  Neurological:     Mental Status: He is alert and oriented to person, place, and time.  Psychiatric:        Behavior: Behavior normal.        Thought Content: Thought content normal.        Judgment: Judgment normal.      Lab Results  Component Value Date   WBC 7.4 12/10/2018   HGB 14.4 12/10/2018   HCT 40.8 12/10/2018   MCV 94.7 12/10/2018   PLT 231 12/10/2018     Chemistry      Component Value Date/Time   NA 134 (L) 12/10/2018 1311   NA 136 11/20/2018 1018   K 4.0 12/10/2018 1311   CL 100 12/10/2018 1311   CO2 25 12/10/2018 1311   BUN 24 (H) 12/10/2018 1311   BUN 21 11/20/2018 1018   CREATININE 1.23 12/10/2018 1311      Component Value Date/Time   CALCIUM 9.3 12/10/2018 1311   ALKPHOS 45 12/10/2018 1311   AST 33 12/10/2018 1311   ALT 34 12/10/2018 1311   BILITOT 0.6 12/10/2018 1311       Impression and Plan: [name] is a 68 year old white male.  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  However, I think that given his appearance, and the fact that his hematocrit is 50%, I really think we are going to have to do some phlebotomies on him.  We will not have to phlebotomize him today.  He is doing quite well.  Clearly, the phlebotomies have not benefited him.  He has  more stamina.  He has more energy.    I will plan to get him back in 2 more months.  If all looks good in 2 months, then we will go to 3 months.      Volanda Napoleon, MD 1/13/20201:56 PM

## 2018-12-11 LAB — IRON AND TIBC
Iron: 139 ug/dL (ref 42–163)
Saturation Ratios: 39 % (ref 20–55)
TIBC: 353 ug/dL (ref 202–409)
UIBC: 214 ug/dL (ref 117–376)

## 2018-12-11 LAB — FERRITIN: FERRITIN: 64 ng/mL (ref 24–336)

## 2018-12-19 ENCOUNTER — Ambulatory Visit: Payer: PPO | Admitting: Internal Medicine

## 2018-12-31 ENCOUNTER — Other Ambulatory Visit: Payer: PPO | Admitting: *Deleted

## 2018-12-31 DIAGNOSIS — E785 Hyperlipidemia, unspecified: Secondary | ICD-10-CM | POA: Diagnosis not present

## 2018-12-31 LAB — HEPATIC FUNCTION PANEL
ALT: 33 IU/L (ref 0–44)
AST: 30 IU/L (ref 0–40)
Albumin: 4.6 g/dL (ref 3.8–4.8)
Alkaline Phosphatase: 57 IU/L (ref 39–117)
BILIRUBIN, DIRECT: 0.15 mg/dL (ref 0.00–0.40)
Bilirubin Total: 0.4 mg/dL (ref 0.0–1.2)
Total Protein: 6.8 g/dL (ref 6.0–8.5)

## 2018-12-31 LAB — LIPID PANEL
Chol/HDL Ratio: 3.4 ratio (ref 0.0–5.0)
Cholesterol, Total: 158 mg/dL (ref 100–199)
HDL: 46 mg/dL (ref >39)
LDL Calculated: 63 mg/dL (ref 0–99)
Triglycerides: 243 mg/dL — ABNORMAL HIGH (ref 0–149)
VLDL Cholesterol Cal: 49 mg/dL — ABNORMAL HIGH (ref 5–40)

## 2019-01-02 ENCOUNTER — Telehealth: Payer: Self-pay

## 2019-01-02 DIAGNOSIS — E785 Hyperlipidemia, unspecified: Secondary | ICD-10-CM

## 2019-01-02 DIAGNOSIS — I10 Essential (primary) hypertension: Secondary | ICD-10-CM

## 2019-01-02 DIAGNOSIS — I2510 Atherosclerotic heart disease of native coronary artery without angina pectoris: Secondary | ICD-10-CM

## 2019-01-02 MED ORDER — ROSUVASTATIN CALCIUM 40 MG PO TABS: 20.0000 mg | ORAL_TABLET | Freq: Every day | ORAL | 3 refills | Status: DC

## 2019-01-02 NOTE — Telephone Encounter (Signed)
-----   Message from Liliane Shi, [place] sent at 12/31/2018  4:54 PM EST ----- HDL and LDL are at goal.  Triglycerides still a little high.  LFTs normal.  Recommendations:  - increase Crestor to 20 mg QD  - obtain fasting Lipids and LFTs in 3 mos. Richardson Dopp, PA-C    12/31/2018 4:51 PM

## 2019-01-02 NOTE — Telephone Encounter (Signed)
Reviewed results with pt who verbalized understanding. Per Richardson Dopp, PA-C to increase Crestor to 20 mg qd and have pt come in to get labs in 3 months (lab appt 5/5) Pt's insurance will not cover Crestor 20 mg so I have put in a order for Crestor 40 mg and take 1/2 tablet(20 mg) which is covered by pt's insurance. Orders have been placed in Epic.

## 2019-01-07 ENCOUNTER — Ambulatory Visit: Payer: PPO | Admitting: Internal Medicine

## 2019-01-08 ENCOUNTER — Other Ambulatory Visit: Payer: Self-pay | Admitting: Family

## 2019-01-15 DIAGNOSIS — H35372 Puckering of macula, left eye: Secondary | ICD-10-CM | POA: Diagnosis not present

## 2019-01-15 DIAGNOSIS — Z09 Encounter for follow-up examination after completed treatment for conditions other than malignant neoplasm: Secondary | ICD-10-CM | POA: Diagnosis not present

## 2019-01-15 DIAGNOSIS — H35371 Puckering of macula, right eye: Secondary | ICD-10-CM | POA: Diagnosis not present

## 2019-02-12 ENCOUNTER — Inpatient Hospital Stay: Payer: PPO | Attending: Hematology & Oncology

## 2019-02-12 ENCOUNTER — Other Ambulatory Visit: Payer: Self-pay

## 2019-02-12 ENCOUNTER — Encounter: Payer: Self-pay | Admitting: Hematology & Oncology

## 2019-02-12 ENCOUNTER — Inpatient Hospital Stay: Payer: PPO

## 2019-02-12 ENCOUNTER — Telehealth: Payer: Self-pay | Admitting: Hematology & Oncology

## 2019-02-12 ENCOUNTER — Inpatient Hospital Stay (HOSPITAL_BASED_OUTPATIENT_CLINIC_OR_DEPARTMENT_OTHER): Payer: PPO | Admitting: Hematology & Oncology

## 2019-02-12 VITALS — BP 143/54 | HR 86 | Temp 98.2°F | Resp 18 | Wt 200.0 lb

## 2019-02-12 VITALS — BP 154/54 | HR 70 | Resp 18

## 2019-02-12 DIAGNOSIS — D751 Secondary polycythemia: Secondary | ICD-10-CM | POA: Insufficient documentation

## 2019-02-12 DIAGNOSIS — Z87891 Personal history of nicotine dependence: Secondary | ICD-10-CM

## 2019-02-12 DIAGNOSIS — E538 Deficiency of other specified B group vitamins: Secondary | ICD-10-CM

## 2019-02-12 LAB — CBC WITH DIFFERENTIAL (CANCER CENTER ONLY)
Abs Immature Granulocytes: 0.02 10*3/uL (ref 0.00–0.07)
BASOS PCT: 1 %
Basophils Absolute: 0.1 10*3/uL (ref 0.0–0.1)
Eosinophils Absolute: 0.3 10*3/uL (ref 0.0–0.5)
Eosinophils Relative: 5 %
HCT: 44.5 % (ref 39.0–52.0)
Hemoglobin: 15.4 g/dL (ref 13.0–17.0)
Immature Granulocytes: 0 %
Lymphocytes Relative: 32 %
Lymphs Abs: 2.1 10*3/uL (ref 0.7–4.0)
MCH: 32.9 pg (ref 26.0–34.0)
MCHC: 34.6 g/dL (ref 30.0–36.0)
MCV: 95.1 fL (ref 80.0–100.0)
Monocytes Absolute: 0.7 10*3/uL (ref 0.1–1.0)
Monocytes Relative: 11 %
Neutro Abs: 3.4 10*3/uL (ref 1.7–7.7)
Neutrophils Relative %: 51 %
Platelet Count: 250 10*3/uL (ref 150–400)
RBC: 4.68 MIL/uL (ref 4.22–5.81)
RDW: 12.6 % (ref 11.5–15.5)
WBC: 6.6 10*3/uL (ref 4.0–10.5)
nRBC: 0 % (ref 0.0–0.2)

## 2019-02-12 LAB — CMP (CANCER CENTER ONLY)
ALT: 36 U/L (ref 0–44)
AST: 37 U/L (ref 15–41)
Albumin: 4.7 g/dL (ref 3.5–5.0)
Alkaline Phosphatase: 53 U/L (ref 38–126)
Anion gap: 9 (ref 5–15)
BUN: 20 mg/dL (ref 8–23)
CO2: 28 mmol/L (ref 22–32)
Calcium: 9.9 mg/dL (ref 8.9–10.3)
Chloride: 101 mmol/L (ref 98–111)
Creatinine: 1.33 mg/dL — ABNORMAL HIGH (ref 0.61–1.24)
GFR, Estimated: 55 mL/min — ABNORMAL LOW (ref >60)
Glucose, Bld: 109 mg/dL — ABNORMAL HIGH (ref 70–99)
Potassium: 3.9 mmol/L (ref 3.5–5.1)
Sodium: 138 mmol/L (ref 135–145)
Total Bilirubin: 0.5 mg/dL (ref 0.3–1.2)
Total Protein: 7.3 g/dL (ref 6.5–8.1)

## 2019-02-12 NOTE — Patient Instructions (Signed)
Therapeutic Phlebotomy Therapeutic phlebotomy is the planned removal of blood from a person's body for the purpose of treating a medical condition. The procedure is similar to donating blood. Usually, about a pint (470 mL, or 0.47 L) of blood is removed. The average adult has 9-12 pints (4.3-5.7 L) of blood in the body. Therapeutic phlebotomy may be used to treat the following medical conditions:  Hemochromatosis. This is a condition in which the blood contains too much iron.  Polycythemia vera. This is a condition in which the blood contains too many red blood cells.  Porphyria cutanea tarda. This is a disease in which an important part of hemoglobin is not made properly. It results in the buildup of abnormal amounts of porphyrins in the body.  Sickle cell disease. This is a condition in which the red blood cells form an abnormal crescent shape rather than a round shape. Tell a health care provider about:  Any allergies you have.  All medicines you are taking, including vitamins, herbs, eye drops, creams, and over-the-counter medicines.  Any problems you or family members have had with anesthetic medicines.  Any blood disorders you have.  Any surgeries you have had.  Any medical conditions you have.  Whether you are pregnant or may be pregnant. What are the risks? Generally, this is a safe procedure. However, problems may occur, including:  Nausea or light-headedness.  Low blood pressure (hypotension).  Soreness, bleeding, swelling, or bruising at the needle insertion site.  Infection. What happens before the procedure?  Follow instructions from your health care provider about eating or drinking restrictions.  Ask your health care provider about: ? Changing or stopping your regular medicines. This is especially important if you are taking diabetes medicines or blood thinners (anticoagulants). ? Taking medicines such as aspirin and ibuprofen. These medicines can thin your  blood. Do not take these medicines unless your health care provider tells you to take them. ? Taking over-the-counter medicines, vitamins, herbs, and supplements.  Wear clothing with sleeves that can be raised above the elbow.  Plan to have someone take you home from the hospital or clinic.  You may have a blood sample taken.  Your blood pressure, pulse rate, and breathing rate will be measured. What happens during the procedure?   To lower your risk of infection: ? Your health care team will wash or sanitize their hands. ? Your skin will be cleaned with an antiseptic.  You may be given a medicine to numb the area (local anesthetic).  A tourniquet will be placed on your arm.  A needle will be inserted into one of your veins.  Tubing and a collection bag will be attached to that needle.  Blood will flow through the needle and tubing into the collection bag.  The collection bag will be placed lower than your arm to allow gravity to help the flow of blood into the bag.  You may be asked to open and close your hand slowly and continually during the entire collection.  After the specified amount of blood has been removed from your body, the collection bag and tubing will be clamped.  The needle will be removed from your vein.  Pressure will be held on the site of the needle insertion to stop the bleeding.  A bandage (dressing) will be placed over the needle insertion site. The procedure may vary among health care providers and hospitals. What happens after the procedure?  Your blood pressure, pulse rate, and breathing rate will be   measured after the procedure.  You will be encouraged to drink fluids.  Your recovery will be assessed and monitored.  You can return to your normal activities as told by your health care provider. Summary  Therapeutic phlebotomy is the planned removal of blood from a person's body for the purpose of treating a medical condition.  Therapeutic  phlebotomy may be used to treat hemochromatosis, polycythemia vera, porphyria cutanea tarda, or sickle cell disease.  In the procedure, a needle is inserted and about a pint (470 mL, or 0.47 L) of blood is removed. The average adult has 9-12 pints (4.3-5.7 L) of blood in the body.  This is generally a safe procedure, but it can sometimes cause problems such as nausea, light-headedness, or low blood pressure (hypotension). This information is not intended to replace advice given to you by your health care provider. Make sure you discuss any questions you have with your health care provider. Document Released: 04/18/2011 Document Revised: 11/30/2017 Document Reviewed: 11/30/2017 Elsevier Interactive Patient Education  2019 [employer]  

## 2019-02-12 NOTE — Telephone Encounter (Signed)
Appointments scheduled calendar printed per 3/17 los 

## 2019-02-12 NOTE — Progress Notes (Signed)
Douglas Christensen presents today for phlebotomy per MD orders. Phlebotomy procedure started at 1305 and ended at 1315. 439 grams removed from rt [facility] using 16g phlebotomy kit.  Patient observed for 30 minutes after procedure without any incident. Patient tolerated procedure well. IV needle removed intact.

## 2019-02-12 NOTE — Progress Notes (Signed)
Hematology and Oncology Follow Up Visit  Douglas Christensen [id number] [birth year] 68 y.o. 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 is back for follow-up.  Unfortunately, he now is out of work because of the coronavirus.  This really is causing a lot of disruption for everybody in the country now.  He is doing okay.  His hematocrit is 44.5% today.  I will go ahead and give him a phlebotomy just to make sure that we keep his blood count down.  He feels better when his hematocrit is down below 45%.  He has been swelling in his legs.  He is on amlodipine.  This probably is a reason.  He is not smoking.  He has not smoked for many years.  He has had no headache.  He has had no visual changes.  He has had no change in bowel or bladder habits.  He has had no cough.  He has had no rashes.  Overall, his performance status is ECOG 0.  Medications:  Current Outpatient Medications:  .  amLODipine (NORVASC) 10 MG tablet, Take 1 tablet (10 mg total) by mouth daily., Disp: 90 tablet, Rfl: 3 .  aspirin EC 81 MG tablet, Take 1 tablet (81 mg total) by mouth daily., Disp: 90 tablet, Rfl: 3 .  cholecalciferol (VITAMIN D) 1000 units tablet, Take 1,000 Units by mouth 2 (two) times daily. , Disp: , Rfl:  .  clonazePAM (KLONOPIN) 0.5 MG tablet, TAKE ONE TABLET TWICE DAILY AS NEEDED FOR ANXIETY, Disp: 60 tablet, Rfl: 3 .  hydrALAZINE (APRESOLINE) 50 MG tablet, TAKE 50 MG BY MOUTH THREE TIMES DAILY, Disp: 270 tablet, Rfl: 3 .  losartan (COZAAR) 25 MG tablet, Take 1 tablet (25 mg total) by mouth daily., Disp: 90 tablet, Rfl: 3 .  Potassium Chloride CR (MICRO-K) 8 MEQ CPCR capsule CR, Take 2 capsules (16 mEq total) by mouth 2 (two) times daily., Disp: 120 capsule, Rfl: 11 .  rosuvastatin (CRESTOR) 40 MG tablet, Take 0.5 tablets (20 mg total) by mouth daily., Disp:  45 tablet, Rfl: 3 .  tadalafil (CIALIS) 20 MG tablet, Take 1 tablet (20 mg total) by mouth daily as needed for erectile dysfunction., Disp: 10 tablet, Rfl: 1 .  triamterene-hydrochlorothiazide (MAXZIDE-25) 37.5-25 MG tablet, TAKE TWO TABLETS BY MOUTH EVERY DAY, Disp: 60 tablet, Rfl: 11 .  vitamin B-12 (CYANOCOBALAMIN) 1000 MCG tablet, Take 1,000 mcg by mouth daily., Disp: , Rfl:   Current Facility-Administered Medications:  .  0.9 %  sodium chloride infusion, 500 mL, Intravenous, Once, Ladene Artist, MD  Allergies:  Allergies  Allergen Reactions  . Roxicodone [Oxycodone Hcl] Other (See Comments)    hallucinations  . Benazepril Cough  . Doxycycline     Nausea, able to tolerate 1 a day  . Itraconazole Nausea Only and Rash  . Lipitor [Atorvastatin Calcium]     cramps  . Plavix [Clopidogrel Bisulfate] Rash    Past Medical History, Surgical history, Social history, and Family History were reviewed and updated.  Review of Systems: Review of Systems  Constitutional: Negative.   HENT:  Negative.   Eyes: Negative.   Respiratory: Negative.   Cardiovascular: Negative.   Gastrointestinal: Negative.   Endocrine: Negative.   Genitourinary: Negative.    Musculoskeletal: Negative.   Skin: Negative.  Neurological: Negative.   Hematological: Negative.   Psychiatric/Behavioral: Negative.     Physical Exam:  weight is 200 lb (90.7 kg). His oral temperature is 98.2 F (36.8 C). His blood pressure is 143/54 (abnormal) and his pulse is 86. His respiration is 18 and oxygen saturation is 99%.   Wt Readings from Last 3 Encounters:  02/12/19 200 lb (90.7 kg)  12/10/18 203 lb (92.1 kg)  11/12/18 206 lb (93.4 kg)    Physical Exam Vitals signs reviewed.  HENT:     Head: Normocephalic and atraumatic.  Eyes:     Pupils: Pupils are equal, round, and reactive to light.  Neck:     Musculoskeletal: Normal range of motion.  Cardiovascular:     Rate and Rhythm: Normal rate and regular  rhythm.     Heart sounds: Normal heart sounds.  Pulmonary:     Effort: Pulmonary effort is normal.     Breath sounds: Normal breath sounds.  Abdominal:     General: Bowel sounds are normal.     Palpations: Abdomen is soft.  Musculoskeletal: Normal range of motion.        General: No tenderness or deformity.  Lymphadenopathy:     Cervical: No cervical adenopathy.  Skin:    General: Skin is warm and dry.     Findings: No erythema or rash.  Neurological:     Mental Status: He is alert and oriented to person, place, and time.  Psychiatric:        Behavior: Behavior normal.        Thought Content: Thought content normal.        Judgment: Judgment normal.      Lab Results  Component Value Date   WBC 6.6 02/12/2019   HGB 15.4 02/12/2019   HCT 44.5 02/12/2019   MCV 95.1 02/12/2019   PLT 250 02/12/2019     Chemistry      Component Value Date/Time   NA 138 02/12/2019 1126   NA 136 11/20/2018 1018   K 3.9 02/12/2019 1126   CL 101 02/12/2019 1126   CO2 28 02/12/2019 1126   BUN 20 02/12/2019 1126   BUN 21 11/20/2018 1018   CREATININE 1.33 (H) 02/12/2019 1126      Component Value Date/Time   CALCIUM 9.9 02/12/2019 1126   ALKPHOS 53 02/12/2019 1126   AST 37 02/12/2019 1126   ALT 36 02/12/2019 1126   BILITOT 0.5 02/12/2019 1126       Impression and Plan: [name] is a 68 year old white male.  I still suspect that he has spurious polycythemia.   I do think that phlebotomies make a difference for him.  He just feels better.  He has more energy.  We will go ahead with a phlebotomy today.  We can then plan to get him back to see [place] probably after [facility] Day.  Hopefully, he will be able to go to the beach where [place] a home.        Volanda Napoleon, MD 3/17/202012:40 PM

## 2019-02-13 ENCOUNTER — Ambulatory Visit: Payer: PPO | Admitting: Internal Medicine

## 2019-02-13 LAB — FERRITIN: Ferritin: 50 ng/mL (ref 24–336)

## 2019-02-13 LAB — IRON AND TIBC
Iron: 108 ug/dL (ref 42–163)
Saturation Ratios: 29 % (ref 20–55)
TIBC: 371 ug/dL (ref 202–409)
UIBC: 263 ug/dL (ref 117–376)

## 2019-02-20 ENCOUNTER — Ambulatory Visit (INDEPENDENT_AMBULATORY_CARE_PROVIDER_SITE_OTHER): Payer: PPO | Admitting: Internal Medicine

## 2019-02-20 ENCOUNTER — Encounter: Payer: Self-pay | Admitting: Internal Medicine

## 2019-02-20 ENCOUNTER — Other Ambulatory Visit: Payer: Self-pay

## 2019-02-20 DIAGNOSIS — I2510 Atherosclerotic heart disease of native coronary artery without angina pectoris: Secondary | ICD-10-CM

## 2019-02-20 DIAGNOSIS — I10 Essential (primary) hypertension: Secondary | ICD-10-CM | POA: Diagnosis not present

## 2019-02-20 DIAGNOSIS — E559 Vitamin D deficiency, unspecified: Secondary | ICD-10-CM

## 2019-02-20 DIAGNOSIS — G8929 Other chronic pain: Secondary | ICD-10-CM

## 2019-02-20 DIAGNOSIS — M5440 Lumbago with sciatica, unspecified side: Secondary | ICD-10-CM | POA: Diagnosis not present

## 2019-02-20 DIAGNOSIS — M5416 Radiculopathy, lumbar region: Secondary | ICD-10-CM | POA: Diagnosis not present

## 2019-02-20 NOTE — Assessment & Plan Note (Signed)
Lipitor, ASA 

## 2019-02-20 NOTE — Assessment & Plan Note (Signed)
Hydralazine, Amlodipine, Maxzide BP Readings from Last 3 Encounters:  02/20/19 (!) 146/60  02/12/19 (!) 154/54  02/12/19 (!) 143/54

## 2019-02-20 NOTE — Assessment & Plan Note (Signed)
Worse Ortho ref - Dr Mayer Camel B12 level

## 2019-02-20 NOTE — Progress Notes (Signed)
Subjective:  Patient ID: Douglas Christensen, male    DOB: June 21, 1951  Age: 68 y.o. MRN: [id number]  CC: No chief complaint on file.   HPI Douglas Christensen presents for polycythemia f/u C/o anxiety, LBP - worse, feet get numb f/u  Outpatient Medications Prior to Visit  Medication Sig Dispense Refill  . aspirin EC 81 MG tablet Take 1 tablet (81 mg total) by mouth daily. 90 tablet 3  . cholecalciferol (VITAMIN D) 1000 units tablet Take 1,000 Units by mouth 2 (two) times daily.     . clonazePAM (KLONOPIN) 0.5 MG tablet TAKE ONE TABLET TWICE DAILY AS NEEDED FOR ANXIETY 60 tablet 3  . hydrALAZINE (APRESOLINE) 50 MG tablet TAKE 50 MG BY MOUTH THREE TIMES DAILY 270 tablet 3  . Potassium Chloride CR (MICRO-K) 8 MEQ CPCR capsule CR Take 2 capsules (16 mEq total) by mouth 2 (two) times daily. 120 capsule 11  . rosuvastatin (CRESTOR) 40 MG tablet Take 0.5 tablets (20 mg total) by mouth daily. 45 tablet 3  . triamterene-hydrochlorothiazide (MAXZIDE-25) 37.5-25 MG tablet TAKE TWO TABLETS BY MOUTH EVERY DAY 60 tablet 11  . vitamin B-12 (CYANOCOBALAMIN) 1000 MCG tablet Take 1,000 mcg by mouth daily.    Marland Kitchen amLODipine (NORVASC) 10 MG tablet Take 1 tablet (10 mg total) by mouth daily. 90 tablet 3  . losartan (COZAAR) 25 MG tablet Take 1 tablet (25 mg total) by mouth daily. 90 tablet 3  . tadalafil (CIALIS) 20 MG tablet Take 1 tablet (20 mg total) by mouth daily as needed for erectile dysfunction. 10 tablet 1   Facility-Administered Medications Prior to Visit  Medication Dose Route Frequency Provider Last Rate Last Dose  . 0.9 %  sodium chloride infusion  500 mL Intravenous Once Ladene Artist, MD        ROS: Review of Systems  Constitutional: Positive for fatigue. Negative for appetite change and unexpected weight change.  HENT: Negative for congestion, nosebleeds, sneezing, sore throat and trouble swallowing.   Eyes: Negative for itching and visual disturbance.  Respiratory: Negative for cough.    Cardiovascular: Negative for chest pain, palpitations and leg swelling.  Gastrointestinal: Negative for abdominal distention, blood in stool, diarrhea and nausea.  Genitourinary: Negative for frequency and hematuria.  Musculoskeletal: Positive for arthralgias, back pain and gait problem. Negative for joint swelling and neck pain.  Skin: Negative for rash.  Neurological: Negative for dizziness, tremors, speech difficulty and weakness.  Psychiatric/Behavioral: Negative for agitation, dysphoric mood, sleep disturbance and suicidal ideas. The patient is not nervous/anxious.     Objective:  BP (!) 146/60 (BP Location: Left Arm, Patient Position: Sitting, Cuff Size: Large)   Pulse 83   Temp 97.9 F (36.6 C) (Oral)   Ht 5\' 5"  (1.651 m)   Wt 199 lb (90.3 kg)   SpO2 99%   BMI 33.12 kg/m   BP Readings from Last 3 Encounters:  02/20/19 (!) 146/60  02/12/19 (!) 154/54  02/12/19 (!) 143/54    Wt Readings from Last 3 Encounters:  02/20/19 199 lb (90.3 kg)  02/12/19 200 lb (90.7 kg)  12/10/18 203 lb (92.1 kg)    Physical Exam Constitutional:      General: He is not in acute distress.    Appearance: He is well-developed.     Comments: NAD  Eyes:     Conjunctiva/sclera: Conjunctivae normal.     Pupils: Pupils are equal, round, and reactive to light.  Neck:     Musculoskeletal: Normal range  of motion.     Thyroid: No thyromegaly.     Vascular: No JVD.  Cardiovascular:     Rate and Rhythm: Normal rate and regular rhythm.     Heart sounds: Normal heart sounds. No murmur. No friction rub. No gallop.   Pulmonary:     Effort: Pulmonary effort is normal. No respiratory distress.     Breath sounds: Normal breath sounds. No wheezing or rales.  Chest:     Chest wall: No tenderness.  Abdominal:     General: Bowel sounds are normal. There is no distension.     Palpations: Abdomen is soft. There is no mass.     Tenderness: There is no abdominal tenderness. There is no guarding or rebound.   Musculoskeletal: Normal range of motion.        General: Tenderness present.  Lymphadenopathy:     Cervical: No cervical adenopathy.  Skin:    General: Skin is warm and dry.     Findings: No rash.  Neurological:     Mental Status: He is alert and oriented to person, place, and time.     Cranial Nerves: No cranial nerve deficit.     Motor: No abnormal muscle tone.     Coordination: Coordination normal.     Gait: Gait normal.     Deep Tendon Reflexes: Reflexes are normal and symmetric.  Psychiatric:        Behavior: Behavior normal.        Thought Content: Thought content normal.        Judgment: Judgment normal.   purplish skin LS painful   Lab Results  Component Value Date   WBC 6.6 02/12/2019   HGB 15.4 02/12/2019   HCT 44.5 02/12/2019   PLT 250 02/12/2019   GLUCOSE 109 (H) 02/12/2019   CHOL 158 12/31/2018   TRIG 243 (H) 12/31/2018   HDL 46 12/31/2018   LDLDIRECT 148.0 06/07/2018   LDLCALC 63 12/31/2018   ALT 36 02/12/2019   AST 37 02/12/2019   NA 138 02/12/2019   K 3.9 02/12/2019   CL 101 02/12/2019   CREATININE 1.33 (H) 02/12/2019   BUN 20 02/12/2019   CO2 28 02/12/2019   TSH 2.60 03/20/2018   PSA 0.87 03/06/2017   INR 1.10 06/22/2015   HGBA1C 5.7 03/06/2017    No results found.  Assessment & Plan:   There are no diagnoses linked to this encounter.   No orders of the defined types were placed in this encounter.    Follow-up: No follow-ups on file.  [name], MD

## 2019-02-20 NOTE — Assessment & Plan Note (Signed)
On Vit D 

## 2019-02-20 NOTE — Assessment & Plan Note (Signed)
Handicapped decal form filled out

## 2019-02-26 DIAGNOSIS — G4730 Sleep apnea, unspecified: Secondary | ICD-10-CM | POA: Diagnosis not present

## 2019-02-26 DIAGNOSIS — G4733 Obstructive sleep apnea (adult) (pediatric): Secondary | ICD-10-CM | POA: Diagnosis not present

## 2019-02-28 DIAGNOSIS — M5442 Lumbago with sciatica, left side: Secondary | ICD-10-CM | POA: Diagnosis not present

## 2019-02-28 DIAGNOSIS — M546 Pain in thoracic spine: Secondary | ICD-10-CM | POA: Diagnosis not present

## 2019-03-21 DIAGNOSIS — M6281 Muscle weakness (generalized): Secondary | ICD-10-CM | POA: Diagnosis not present

## 2019-03-21 DIAGNOSIS — M5416 Radiculopathy, lumbar region: Secondary | ICD-10-CM | POA: Diagnosis not present

## 2019-03-28 DIAGNOSIS — M6281 Muscle weakness (generalized): Secondary | ICD-10-CM | POA: Diagnosis not present

## 2019-03-28 DIAGNOSIS — M5416 Radiculopathy, lumbar region: Secondary | ICD-10-CM | POA: Diagnosis not present

## 2019-04-01 DIAGNOSIS — M6281 Muscle weakness (generalized): Secondary | ICD-10-CM | POA: Diagnosis not present

## 2019-04-01 DIAGNOSIS — M5416 Radiculopathy, lumbar region: Secondary | ICD-10-CM | POA: Diagnosis not present

## 2019-04-02 ENCOUNTER — Other Ambulatory Visit: Payer: Self-pay

## 2019-04-02 ENCOUNTER — Other Ambulatory Visit: Payer: PPO

## 2019-04-02 DIAGNOSIS — E785 Hyperlipidemia, unspecified: Secondary | ICD-10-CM

## 2019-04-02 DIAGNOSIS — I2510 Atherosclerotic heart disease of native coronary artery without angina pectoris: Secondary | ICD-10-CM

## 2019-04-02 DIAGNOSIS — I10 Essential (primary) hypertension: Secondary | ICD-10-CM

## 2019-04-02 LAB — HEPATIC FUNCTION PANEL
ALT: 25 IU/L (ref 0–44)
AST: 25 IU/L (ref 0–40)
Albumin: 4.6 g/dL (ref 3.8–4.8)
Alkaline Phosphatase: 52 IU/L (ref 39–117)
Bilirubin Total: 0.4 mg/dL (ref 0.0–1.2)
Bilirubin, Direct: 0.14 mg/dL (ref 0.00–0.40)
Total Protein: 6.9 g/dL (ref 6.0–8.5)

## 2019-04-02 LAB — LIPID PANEL
Chol/HDL Ratio: 3.4 ratio (ref 0.0–5.0)
Cholesterol, Total: 134 mg/dL (ref 100–199)
HDL: 40 mg/dL (ref >39)
LDL Calculated: 51 mg/dL (ref 0–99)
Triglycerides: 214 mg/dL — ABNORMAL HIGH (ref 0–149)
VLDL Cholesterol Cal: 43 mg/dL — ABNORMAL HIGH (ref 5–40)

## 2019-04-03 DIAGNOSIS — M5416 Radiculopathy, lumbar region: Secondary | ICD-10-CM | POA: Diagnosis not present

## 2019-04-03 DIAGNOSIS — M6281 Muscle weakness (generalized): Secondary | ICD-10-CM | POA: Diagnosis not present

## 2019-04-24 ENCOUNTER — Other Ambulatory Visit: Payer: Self-pay | Admitting: Internal Medicine

## 2019-04-24 ENCOUNTER — Telehealth: Payer: Self-pay | Admitting: Hematology & Oncology

## 2019-04-24 NOTE — Telephone Encounter (Signed)
pt r/s appt 5/26 due to being out of town. pt ok to see Judson Roch due to Dr Marin Olp sch full

## 2019-04-26 ENCOUNTER — Other Ambulatory Visit: Payer: Self-pay | Admitting: Internal Medicine

## 2019-04-26 ENCOUNTER — Ambulatory Visit: Payer: PPO | Admitting: Hematology & Oncology

## 2019-04-26 ENCOUNTER — Other Ambulatory Visit: Payer: PPO

## 2019-04-29 ENCOUNTER — Other Ambulatory Visit: Payer: Self-pay | Admitting: Internal Medicine

## 2019-05-02 ENCOUNTER — Other Ambulatory Visit: Payer: Self-pay | Admitting: Internal Medicine

## 2019-05-02 DIAGNOSIS — E785 Hyperlipidemia, unspecified: Secondary | ICD-10-CM

## 2019-05-03 ENCOUNTER — Inpatient Hospital Stay: Payer: PPO | Attending: Hematology & Oncology

## 2019-05-03 ENCOUNTER — Other Ambulatory Visit: Payer: Self-pay

## 2019-05-03 ENCOUNTER — Encounter: Payer: Self-pay | Admitting: Family

## 2019-05-03 ENCOUNTER — Inpatient Hospital Stay: Payer: PPO

## 2019-05-03 ENCOUNTER — Inpatient Hospital Stay (HOSPITAL_BASED_OUTPATIENT_CLINIC_OR_DEPARTMENT_OTHER): Payer: PPO | Admitting: Family

## 2019-05-03 VITALS — BP 156/48 | HR 45 | Temp 98.0°F | Resp 18 | Ht 65.0 in | Wt 196.1 lb

## 2019-05-03 DIAGNOSIS — D751 Secondary polycythemia: Secondary | ICD-10-CM

## 2019-05-03 DIAGNOSIS — R5383 Other fatigue: Secondary | ICD-10-CM

## 2019-05-03 DIAGNOSIS — D500 Iron deficiency anemia secondary to blood loss (chronic): Secondary | ICD-10-CM | POA: Insufficient documentation

## 2019-05-03 DIAGNOSIS — F17210 Nicotine dependence, cigarettes, uncomplicated: Secondary | ICD-10-CM | POA: Diagnosis not present

## 2019-05-03 DIAGNOSIS — E538 Deficiency of other specified B group vitamins: Secondary | ICD-10-CM

## 2019-05-03 LAB — CBC WITH DIFFERENTIAL (CANCER CENTER ONLY)
Abs Immature Granulocytes: 0.02 10*3/uL (ref 0.00–0.07)
Basophils Absolute: 0.1 10*3/uL (ref 0.0–0.1)
Basophils Relative: 1 %
Eosinophils Absolute: 0.2 10*3/uL (ref 0.0–0.5)
Eosinophils Relative: 2 %
HCT: 44 % (ref 39.0–52.0)
Hemoglobin: 15.4 g/dL (ref 13.0–17.0)
Immature Granulocytes: 0 %
Lymphocytes Relative: 23 %
Lymphs Abs: 1.9 10*3/uL (ref 0.7–4.0)
MCH: 32.4 pg (ref 26.0–34.0)
MCHC: 35 g/dL (ref 30.0–36.0)
MCV: 92.6 fL (ref 80.0–100.0)
Monocytes Absolute: 1 10*3/uL (ref 0.1–1.0)
Monocytes Relative: 12 %
Neutro Abs: 5.3 10*3/uL (ref 1.7–7.7)
Neutrophils Relative %: 62 %
Platelet Count: 215 10*3/uL (ref 150–400)
RBC: 4.75 MIL/uL (ref 4.22–5.81)
RDW: 13 % (ref 11.5–15.5)
WBC Count: 8.5 10*3/uL (ref 4.0–10.5)
nRBC: 0 % (ref 0.0–0.2)

## 2019-05-03 LAB — CMP (CANCER CENTER ONLY)
ALT: 32 U/L (ref 0–44)
AST: 35 U/L (ref 15–41)
Albumin: 4.6 g/dL (ref 3.5–5.0)
Alkaline Phosphatase: 49 U/L (ref 38–126)
Anion gap: 10 (ref 5–15)
BUN: 20 mg/dL (ref 8–23)
CO2: 26 mmol/L (ref 22–32)
Calcium: 9.6 mg/dL (ref 8.9–10.3)
Chloride: 103 mmol/L (ref 98–111)
Creatinine: 0.98 mg/dL (ref 0.61–1.24)
GFR, Est AFR Am: >60 mL/min (ref >60)
GFR, Estimated: >60 mL/min (ref >60)
Glucose, Bld: 116 mg/dL — ABNORMAL HIGH (ref 70–99)
Potassium: 3.8 mmol/L (ref 3.5–5.1)
Sodium: 139 mmol/L (ref 135–145)
Total Bilirubin: 0.6 mg/dL (ref 0.3–1.2)
Total Protein: 7.1 g/dL (ref 6.5–8.1)

## 2019-05-03 NOTE — Progress Notes (Signed)
Hematology and Oncology Follow Up Visit  Douglas Christensen [id number] [birth year] 68 y.o. 05/03/2019   Principle Diagnosis:  Secondary polycythemia, due to smoking/sleep apnea  Current Therapy:  Phlebotomy to keep Hct < 45% EC ASA 81 mg po q day         Interim History:  Douglas Christensen is here today for follow-up. He is doing well but has noted some mild fatigue at times.  His Hct today is stable at 44%. He is taking his daily aspirin.  He has had no fever, chills, n/v, cough, rash, dizziness, SOB, chest pain, palpitations, abdominal pain or changes in bowel or bladder habits.  No swelling in his extremities.  He noes have what sounds like some neuropathy in his feet. The burn and tingling. He plans to follow-up with his vascular doctor for further evaluation.  No lymphadenopathy noted on exam.  He is eating well and staying hydrated. His weight is stable.   ECOG Performance Status: 1 - Symptomatic but completely ambulatory  Medications:  Allergies as of 05/03/2019      Reactions   Roxicodone [oxycodone Hcl] Other (See Comments)   hallucinations   Benazepril Cough   Doxycycline    Nausea, able to tolerate 1 a day   Itraconazole Nausea Only, Rash   Lipitor [atorvastatin Calcium]    cramps   Plavix [clopidogrel Bisulfate] Rash      Medication List       Accurate as of May 03, 2019  1:18 PM. If you have any questions, ask your nurse or doctor.        amLODipine 10 MG tablet Commonly known as:  NORVASC Take 1 tablet (10 mg total) by mouth daily.   aspirin EC 81 MG tablet Take 1 tablet (81 mg total) by mouth daily.   cholecalciferol 1000 units tablet Commonly known as:  VITAMIN D Take 1,000 Units by mouth 2 (two) times daily.   clonazePAM 0.5 MG tablet Commonly known as:  KLONOPIN TAKE ONE TABLET TWICE DAILY AS NEEDED FOR ANXIETY   hydrALAZINE 50 MG tablet Commonly known as:  APRESOLINE TAKE 1 tablet BY MOUTH THREE TIMES DAILY   losartan 25 MG tablet Commonly known as:   COZAAR Take 1 tablet (25 mg total) by mouth daily.   Potassium Chloride CR 8 MEQ Cpcr capsule CR Commonly known as:  MICRO-K Take 2 capsules (16 mEq total) by mouth 2 (two) times daily.   rosuvastatin 40 MG tablet Commonly known as:  CRESTOR Take 0.5 tablets (20 mg total) by mouth daily.   tadalafil (PAH) 20 MG tablet Commonly known as:  ADCIRCA Take 1 tablet (20 mg total) by mouth daily as needed for erectile dysfunction.   triamterene-hydrochlorothiazide 37.5-25 MG tablet Commonly known as:  MAXZIDE-25 TAKE TWO TABLETS BY MOUTH EVERY DAY   vitamin B-12 1000 MCG tablet Commonly known as:  CYANOCOBALAMIN Take 1,000 mcg by mouth daily.       Allergies:  Allergies  Allergen Reactions  . Roxicodone [Oxycodone Hcl] Other (See Comments)    hallucinations  . Benazepril Cough  . Doxycycline     Nausea, able to tolerate 1 a day  . Itraconazole Nausea Only and Rash  . Lipitor [Atorvastatin Calcium]     cramps  . Plavix [Clopidogrel Bisulfate] Rash    Past Medical History, Surgical history, Social history, and Family History were reviewed and updated.  Review of Systems: All other 10 point review of systems is negative.   Physical Exam:  vitals were  not taken for this visit.   Wt Readings from Last 3 Encounters:  02/20/19 199 lb (90.3 kg)  02/12/19 200 lb (90.7 kg)  12/10/18 203 lb (92.1 kg)    Ocular: Sclerae unicteric, pupils equal, round and reactive to light Ear-nose-throat: Oropharynx clear, dentition fair Lymphatic: No cervical or supraclavicular adenopathy Lungs no rales or rhonchi, good excursion bilaterally Heart regular rate and rhythm, no murmur appreciated Abd soft, nontender, positive bowel sounds, no liver or spleen tip palpated on exam, no fluid wave  MSK no focal spinal tenderness, no joint edema Neuro: non-focal, well-oriented, appropriate affect Breasts: Deferred   Lab Results  Component Value Date   WBC 8.5 05/03/2019   HGB 15.4 05/03/2019    HCT 44.0 05/03/2019   MCV 92.6 05/03/2019   PLT 215 05/03/2019   Lab Results  Component Value Date   FERRITIN 50 02/12/2019   IRON 108 02/12/2019   TIBC 371 02/12/2019   UIBC 263 02/12/2019   IRONPCTSAT 29 02/12/2019   Lab Results  Component Value Date   RETICCTPCT 2.1 (H) 07/13/2018   RBC 4.75 05/03/2019   No results found for: Nils Pyle [facility] Lab Results  Component Value Date   IGGSERUM 858 05/01/2008   IGMSERUM 134 05/01/2008   No results found for: Odetta Pink, SPEI   Chemistry      Component Value Date/Time   NA 138 02/12/2019 1126   NA 136 11/20/2018 1018   K 3.9 02/12/2019 1126   CL 101 02/12/2019 1126   CO2 28 02/12/2019 1126   BUN 20 02/12/2019 1126   BUN 21 11/20/2018 1018   CREATININE 1.33 (H) 02/12/2019 1126      Component Value Date/Time   CALCIUM 9.9 02/12/2019 1126   ALKPHOS 52 04/02/2019 0956   AST 25 04/02/2019 0956   AST 37 02/12/2019 1126   ALT 25 04/02/2019 0956   ALT 36 02/12/2019 1126   BILITOT 0.4 04/02/2019 0956   BILITOT 0.5 02/12/2019 1126       Impression and Plan: Douglas Christensen is a very pleasant 67 yo caucasian with secondary polycythemia.  Hct is 44%. No phlebotomy needed at this time.  We will plan to see him back in another 2 months.  He will contact our office with any questions or concerns. We can certainly see him sooner if need be.   Laverna Peace, NP 6/5/20201:18 PM

## 2019-05-06 LAB — FERRITIN: Ferritin: 45 ng/mL (ref 24–336)

## 2019-05-06 LAB — IRON AND TIBC
Iron: 91 ug/dL (ref 42–163)
Saturation Ratios: 24 % (ref 20–55)
TIBC: 385 ug/dL (ref 202–409)
UIBC: 294 ug/dL (ref 117–376)

## 2019-05-06 LAB — VITAMIN B12: Vitamin B-12: 954 pg/mL — ABNORMAL HIGH (ref 180–914)

## 2019-05-08 ENCOUNTER — Telehealth: Payer: Self-pay | Admitting: Family

## 2019-05-08 NOTE — Telephone Encounter (Signed)
Appointments scheduled letter mailed per 6/5 los  °

## 2019-05-09 ENCOUNTER — Telehealth: Payer: Self-pay | Admitting: Cardiovascular Disease

## 2019-05-09 DIAGNOSIS — I10 Essential (primary) hypertension: Secondary | ICD-10-CM

## 2019-05-09 MED ORDER — LOSARTAN POTASSIUM 25 MG PO TABS: 50.0000 mg | ORAL_TABLET | Freq: Every day | ORAL | 3 refills | Status: DC

## 2019-05-09 NOTE — Telephone Encounter (Signed)
Spoke with patient aware of  Medication recommendations and bmet in 2 weeks   Patient also consented to telephone visit    Confirm consent - "In the setting of the current Covid19 crisis, you are scheduled for a (phone or video) visit with your provider on (date) at (time).  Just as we do with many in-office visits, in order for you to participate in this visit, we must obtain consent.  If you'd like, I can send this to your mychart (if signed up) or email for you to review.  Otherwise, I can obtain your verbal consent now.  All virtual visits are billed to your insurance company just like a normal visit would be.  By agreeing to a virtual visit, we'd like you to understand that the technology does not allow for your provider to perform an examination, and thus may limit your provider's ability to fully assess your condition. If your provider identifies any concerns that need to be evaluated in person, we will make arrangements to do so.  Finally, though the technology is pretty good, we cannot assure that it will always work on either your or our end, and in the setting of a video visit, we may have to convert it to a phone-only visit.  In either situation, we cannot ensure that we have a secure connection.  Are you willing to proceed?" STAFF: Did the patient verbally acknowledge consent to telehealth visit? Document YES/NO here:  YES   TELEPHONE CALL NOTE  Douglas Christensen has been deemed a candidate for a follow-up tele-health visit to limit community exposure during the Covid-19 pandemic. I spoke with the patient via phone to ensure availability of phone/video source, confirm preferred email & phone number, and discuss instructions and expectations.  I reminded Douglas Christensen to be prepared with any vital sign and/or heart rhythm information that could potentially be obtained via home monitoring, at the time of his visit. I reminded Douglas Christensen to expect a phone call prior to his visit.  Claude Manges, [place]  05/09/2019 3:04 PM    FULL LENGTH CONSENT FOR TELE-HEALTH VISIT   I hereby voluntarily request, consent and authorize [facility] and its employed or contracted physicians, physician assistants, nurse practitioners or other licensed health care professionals (the Practitioner), to provide me with telemedicine health care services (the "Services") as deemed necessary by the treating Practitioner. I acknowledge and consent to receive the Services by the Practitioner via telemedicine. I understand that the telemedicine visit will involve communicating with the Practitioner through live audiovisual communication technology and the disclosure of certain medical information by electronic transmission. I acknowledge that I have been given the opportunity to request an in-person assessment or other available alternative prior to the telemedicine visit and am voluntarily participating in the telemedicine visit.  I understand that I have the right to withhold or withdraw my consent to the use of telemedicine in the course of my care at any time, without affecting my right to future care or treatment, and that the Practitioner or I may terminate the telemedicine visit at any time. I understand that I have the right to inspect all information obtained and/or recorded in the course of the telemedicine visit and may receive copies of available information for a reasonable fee.  I understand that some of the potential risks of receiving the Services via telemedicine include:  Marland Kitchen Delay or interruption in medical evaluation due to technological equipment failure or disruption; . Information transmitted may not be  sufficient (e.g. poor resolution of images) to allow for appropriate medical decision making by the Practitioner; and/or  . In rare instances, security protocols could fail, causing a breach of personal health information.  Furthermore, I acknowledge that it is my responsibility to provide information about  my medical history, conditions and care that is complete and accurate to the best of my ability. I acknowledge that Practitioner's advice, recommendations, and/or decision may be based on factors not within their control, such as incomplete or inaccurate data provided by me or distortions of diagnostic images or specimens that may result from electronic transmissions. I understand that the practice of medicine is not an exact science and that Practitioner makes no warranties or guarantees regarding treatment outcomes. I acknowledge that I will receive a copy of this consent concurrently upon execution via email to the email address I last provided but may also request a printed copy by calling the office of [place].    I understand that my insurance will be billed for this visit.   I have read or had this consent read to me. . I understand the contents of this consent, which adequately explains the benefits and risks of the Services being provided via telemedicine.  . I have been provided ample opportunity to ask questions regarding this consent and the Services and have had my questions answered to my satisfaction. . I give my informed consent for the services to be provided through the use of telemedicine in my medical care  By participating in this telemedicine visit I agree to the above.

## 2019-05-09 NOTE — Telephone Encounter (Signed)
Spoke with patient about blood pressures and symptoms.  Pt stated blood pressure tooken 5 mins before I called was. 146/78  This am 186/74 hr 62   The following blood pressures patient could give specific time frame other than over The grace period of 8 to 10 days  136/70 HR-104 177/76 HR-52 186/65 HR-52 190/71 HR-52  Spoke with patient about symptoms of fatigue which patient stated that he has been dealing with over 3 to 4 years and Nicki Reaper was aware of that.Patient is currently taking Hydralazine 50 mg three times a day medication relative to his blood pressure. Patient taking  potassium 8 meq twice daily.  Pt concerned of spikes in pressure but he stated it does eventually come down. Patient is instructed to stop taking blood pressure and provider will be contacted for further recommendations.

## 2019-05-09 NOTE — Telephone Encounter (Signed)
   Increase Losartan to 50 mg once daily   Obtain BMET in 2 weeks. Richardson Dopp, PA-C    05/09/2019 2:00 PM

## 2019-05-09 NOTE — Telephone Encounter (Signed)
New Message    Pt c/o BP issue: STAT if pt c/o blurred vision, one-sided weakness or slurred speech  1. What are your last 5 BP readings?  186/74 HR-62 136/70 HR-104 177/76 HR-52 186/65 HR-52 190/71 HR-52   2. Are you having any other symptoms (ex. Dizziness, headache, blurred vision, passed out)? Fatigue and SOB  3. What is your BP issue? Patient's BP tends to spike and he's been feeling fatigued wants to speak to nurse about it

## 2019-05-13 NOTE — Progress Notes (Signed)
Virtual Visit via Telephone Note   This visit type was conducted due to national recommendations for restrictions regarding the COVID-19 Pandemic (e.g. social distancing) in an effort to limit this patient's exposure and mitigate transmission in our community.  Due to his co-morbid illnesses, this patient is at least at moderate risk for complications without adequate follow up.  This format is felt to be most appropriate for this patient at this time.  The patient did not have access to video technology/had technical difficulties with video requiring transitioning to audio format only (telephone).  All issues noted in this document were discussed and addressed.  No physical exam could be performed with this format.  Please refer to the patient's chart for his  consent to telehealth for [facility].   Date:  05/14/2019   ID:  Douglas Christensen, DOB 1950/12/06, Douglas Christensen  Patient Location: Home Provider Location: Home  PCP:  Cassandria Anger, MD  Cardiologist:  Sherren Mocha, MD   Electrophysiologist:  None   Evaluation Performed:  Follow-Up Visit  Chief Complaint:  Follow-up on coronary artery disease  History of Present Illness:    Douglas Christensen is a 68 y.o. male with:  CAD s/p CABG in 04/2013  PAD   s/p R SFA stent and L CIA stent in 2008  L EIA stent 2012  L EIA/CFA/PFA/SFA endarterectomy  L SFA stent in 3/16)   second degree heart block type 1  2-1 heart block in 06/2017 >> resolved with stopping beta-blocker   Mobitz 1  Hypertension    Hyperlipidemia   OSA  GERD,   prior ETOH abuse.   He was last seen in Dec 2019.  Today, he notes that he continues to have shortness of breath with exertion.  He feels it is getting worse.  He did not really have chest pain prior to his bypass.  He only had shortness of breath.  He is concerned that he may have a new blockage.  He has not had any chest pain.  He does get tired easily.  He has not had any orthopnea.  He has  not had PND.  He has chronic lower extremity swelling.  He uses CPAP at night.  He denies syncope or near syncope.  He has not had any fever, cough, bleeding issues.  The patient does not have symptoms concerning for COVID-19 infection (fever, chills, cough, or new shortness of breath).    Past Medical History:  Diagnosis Date  . Anxiety   . Arthritis   . CAD (coronary artery disease)    Mild plaque (cath "years ago"); abnormal Myoview 04/2013 with subsequent CABG x 5 with LIMA to LAD, SVG to OM1, SVG to DX, SVG to PD & PL.   . Cataract   . COPD (chronic obstructive pulmonary disease) ([place])   . GERD (gastroesophageal reflux disease)   . History of colonic polyps   . History of echocardiogram    Echo 2/19: Mild LVH, EF 60-65, normal wall motion, grade 2 diastolic dysfunction, mild RAE  . History of nuclear stress test    Myoview 2/19: inf/inf-lat/apical inf/ant-sept defect (?diaph atten - cannot rule out peri-infarct ischemia), PVCs/PACs/Mobitz 1  . Hx of echocardiogram    Echo (9/15):  EF 55-60%; Gr 2 DD, mild BAE  . Hyperlipidemia   . Hypertension   . LBP (low back pain)   . Meralgia paresthetica of left side 2011  . PVD (peripheral vascular disease) ([place])    Stent  to left common femoral and right superficial femoral.  2008.  50%  left renal   . Second degree AV block, Mobitz type I    Holter 2/19: Sinus rhythm, average heart rate 72, frequent PVCs (burden 5%), second-degree type I AV block and periods of 2:1 heart block >> continue clinical managment and avoid AVN blocking agents  . Shortness of breath    "once in awhile; can happen at anytime" (08/26/2013)  . Sleep apnea    mod OSA, central sleep apnea/hypoapnea syndrome 11/22/12, CPAP every night   . Vitamin D deficiency    Past Surgical History:  Procedure Laterality Date  . ABDOMINAL AORTAGRAM N/A 11/16/2011   Procedure: ABDOMINAL Maxcine Ham;  Surgeon: Sherren Mocha, MD;  Location[facility] CATH LAB;  Service: Cardiovascular;   Laterality: N/A;  . ABDOMINAL AORTAGRAM N/A 11/14/2012   Procedure: ABDOMINAL Maxcine Ham;  Surgeon: Sherren Mocha, MD;  Location: [facility] CATH LAB;  Service: Cardiovascular;  Laterality: N/A;  . ABDOMINAL AORTAGRAM N/A 12/30/2014   Procedure: ABDOMINAL Maxcine Ham;  Surgeon: Serafina Mitchell, MD;  Location: [facility] CATH LAB;  Service: Cardiovascular;  Laterality: N/A;  . CARDIAC CATHETERIZATION  05/02/13   x2   . CORONARY ARTERY BYPASS GRAFT N/A 05/06/2013   Procedure: CORONARY ARTERY BYPASS GRAFTING (CABG);  Surgeon: Melrose Nakayama, MD;  Location: [place];  Service: Open Heart Surgery;  Laterality: N/A;  Coronary artery bypass graft times five using left internal mammary artery and left greater saphenous vein via endovein harvest.  . ENDARTERECTOMY FEMORAL Left 01/29/2015   Procedure: ENDARTERECTOMY FEMORAL WITH PATCH ANGIOPLASTY;  Surgeon: Serafina Mitchell, MD;  Location: [place];  Service: Vascular;  Laterality: Left;  . EYE SURGERY  03/24/16   cataract surgery on left eye  . FEMORAL-POPLITEAL BYPASS GRAFT  12/27/2012   Procedure: BYPASS GRAFT FEMORAL-POPLITEAL ARTERY;  Surgeon: Serafina Mitchell, MD;  Location: [facility] OR;  Service: Vascular;  Laterality: Right;  using non-reversed sapphenous vein.  Marland Kitchen ILIAC ATHERECTOMY Left 01/29/2015   Procedure: SUPERFICIAL FEMORAL ARTERY ATHERECTOMY/PERCUTANEOUS TRANSLUMINAL ANGIOPLASTY; superficial femoral artery stent;  Surgeon: Serafina Mitchell, MD;  Location: [place];  Service: Vascular;  Laterality: Left;  . LOWER EXTREMITY ANGIOGRAM Bilateral 11/16/2011   Procedure: LOWER EXTREMITY ANGIOGRAM;  Surgeon: Sherren Mocha, MD;  Location: [facility] CATH LAB;  Service: Cardiovascular;  Laterality: Bilateral;  . lower extremity stents     bilateral lower extremities x 2  . PERCUTANEOUS STENT INTERVENTION Left 11/16/2011   Procedure: PERCUTANEOUS STENT INTERVENTION;  Surgeon: Sherren Mocha, MD;  Location: [facility] CATH LAB;  Service: Cardiovascular;  Laterality: Left;  . TONSILLECTOMY    . TOTAL HIP  ARTHROPLASTY Left 06/29/2015   Procedure: TOTAL HIP ARTHROPLASTY;  Surgeon: Frederik Pear, MD;  Location: [place];  Service: Orthopedics;  Laterality: Left;  LEFT TOTAL HIP ARTHROPLASTY DEPUY SROM/PINNACLE     Current Meds  Medication Sig  . amLODipine (NORVASC) 10 MG tablet Take 1 tablet (10 mg total) by mouth daily.  Marland Kitchen aspirin EC 81 MG tablet Take 1 tablet (81 mg total) by mouth daily.  . cholecalciferol (VITAMIN D) 1000 units tablet Take 1,000 Units by mouth 2 (two) times daily.   . clonazePAM (KLONOPIN) 0.5 MG tablet TAKE ONE TABLET TWICE DAILY AS NEEDED FOR ANXIETY  . hydrALAZINE (APRESOLINE) 50 MG tablet TAKE 1 tablet BY MOUTH THREE TIMES DAILY  . losartan (COZAAR) 25 MG tablet Take 2 tablets (50 mg total) by mouth daily.  . Potassium Chloride CR (MICRO-K) 8 MEQ CPCR capsule CR Take  2 capsules (16 mEq total) by mouth 2 (two) times daily.  . rosuvastatin (CRESTOR) 40 MG tablet Take 0.5 tablets (20 mg total) by mouth daily.  . tadalafil, PAH, (ADCIRCA) 20 MG tablet Take 1 tablet (20 mg total) by mouth daily as needed for erectile dysfunction.  . triamterene-hydrochlorothiazide (MAXZIDE-25) 37.5-25 MG tablet TAKE TWO TABLETS BY MOUTH EVERY DAY  . vitamin B-12 (CYANOCOBALAMIN) 1000 MCG tablet Take 1,000 mcg by mouth daily.     Allergies:   Roxicodone [oxycodone hcl], Benazepril, Doxycycline, Itraconazole, Lipitor [atorvastatin calcium], and Plavix [clopidogrel bisulfate]   Social History   Tobacco Use  . Smoking status: Former Smoker    Packs/day: 2.00    Years: 47.00    Pack years: 94.00    Types: Cigarettes    Quit date: 12/17/2012    Years since quitting: 6.4  . Smokeless tobacco: Never Used  Substance Use Topics  . Alcohol use: Yes    Alcohol/week: 4.0 standard drinks    Types: 4 Shots of liquor per week    Comment: 08/26/2013 "3-4 mixed drinks/wk"  . Drug use: No     Family Hx: The patient's family history includes Alzheimer's disease in his father; Cancer in his father and  mother; Heart disease in his mother; Hyperlipidemia in his mother; Hypertension in his mother. There is no history of Colon cancer, Heart attack, Esophageal cancer, Liver cancer, Rectal cancer, Stomach cancer, or Pancreatic cancer.  ROS:   Please see the history of present illness.     All other systems reviewed and are negative.   Prior CV studies:   The following studies were reviewed today:  Holter 01/10/18 1.  The basic rhythm is normal sinus with an average heart rate of 72 bpm 2.  There are frequent PVCs (5% burden) 3.  There is second-degree type I AV block and periods of 2: 1 heart block   Echo 01/22/18 Mild LVH, EF 80-03, grade 2 diastolic dysfunction, mild RAE   Nuclear stress test 01/10/18 Inferior/inferolateral/apical inferior defect with minimal reversibility and anteroseptal wall-likely diaphragmatic attenuation; cannot rule out infarct with peri-infarct ischemia; sinus rhythm with PVCs, PACs and intermittent type I second-degree AV block which improved with increased heart rate; intermediate risk   Echo 07/2014 EF 55-60%, Gr 2 DD Mild BAE   LHC (6/14):   prox and mid LM 40-50%, prox LAD 70%, ostial Dx 75%, mid LAD 90-95%, prox CFX 60-70%, mid RCA occluded, EF 55-65% >>> CABG   Carotid US (1/14):   Bilateral ICA 1-39%   Carotid US (1/14):   Bilateral ICA 1-39%  Labs/Other Tests and Data Reviewed:    EKG:  No ECG reviewed.  Recent Labs: 05/03/2019: ALT 32; BUN 20; Creatinine 0.98; Hemoglobin 15.4; Platelet Count 215; Potassium 3.8; Sodium 139   Recent Lipid Panel Lab Results  Component Value Date/Time   CHOL 134 04/02/2019 09:56 AM   TRIG 214 (H) 04/02/2019 09:56 AM   TRIG 118 09/25/2006 10:02 AM   HDL 40 04/02/2019 09:56 AM   CHOLHDL 3.4 04/02/2019 09:56 AM   CHOLHDL 5 06/07/2018 11:21 AM   LDLCALC 51 04/02/2019 09:56 AM   LDLDIRECT 148.0 06/07/2018 11:21 AM    Wt Readings from Last 3 Encounters:  05/14/19 194 lb (88 kg)  05/03/19 196 lb 1.9 oz (89 kg)   02/20/19 199 lb (90.3 kg)     Objective:    Vital Signs:  BP (!) 145/74   Pulse (!) 54   Ht 5\' 5"  (1.651 m)  Wt 194 lb (88 kg)   BMI 32.28 kg/m    VITAL SIGNS:  reviewed GEN:  no acute distress RESPIRATORY:  No labored breathing NEURO:  Alert and oriented PSYCH:  He seems to be in a good mood  ASSESSMENT & PLAN:     Coronary artery disease involving native coronary artery of native heart without angina pectoris / Shortness of breath -  Plan: History of CABG in 2014.  Myoview November 2019 was negative for ischemia.  His anginal equivalent prior to his bypass was shortness of breath.  He is concerned that his worsening shortness of breath may be an indicator that he has any blockage.  Since it has been more than 1 year since his stress test, I think it is reasonable to proceed with a repeat Lexiscan Myoview.  He also has a history of moderate diastolic dysfunction on echocardiogram.  Recent creatinine and potassium were normal.  I will also obtain a BNP.  If this is elevated, he will need to change Maxide to furosemide.  We will plan on an in person return visit with me or Dr. Burt Knack in about 4 weeks.  -Arrange Lexiscan Myoview  -Obtain BNP  -If BNP significantly elevated, change Maxide to furosemide  -Follow-up 4 weeks  Essential hypertension -  Plan: He notes continued blood pressure elevation throughout the day.  I have asked him to change at the timing of his medications.  He will start taking losartan altogether as well as amlodipine in the evening.  He will continue on hydralazine 3 times a day.  He will take Maxide in the morning.  His blood pressure continues to remain uncontrolled, consider adjusting hydralazine.  Hyperlipidemia, unspecified hyperlipidemia type -  Plan: Recent LDL optimal.  Continue current therapy.  Peripheral vascular disease ([place]) -  Plan: Continue follow-up with vascular surgery.  COVID-19 Education: The signs and symptoms of COVID-19 were  discussed with the patient and how to seek care for testing (follow up with PCP or arrange E-visit).  The importance of social distancing was discussed today.  Time:   Today, I have spent 20 minutes with the patient with telehealth technology discussing the above problems.     Medication Adjustments/Labs and Tests Ordered: Current medicines are reviewed at length with the patient today.  Concerns regarding medicines are outlined above.   Tests Ordered: Orders Placed This Encounter  Procedures  . Pro b natriuretic peptide (BNP)  . MYOCARDIAL PERFUSION IMAGING    Medication Changes: No orders of the defined types were placed in this encounter.   Follow Up:  In Person in 4 week(s)  Signed, Richardson Dopp, PA-C  05/14/2019 5:11 PM    [facility]

## 2019-05-14 ENCOUNTER — Telehealth (INDEPENDENT_AMBULATORY_CARE_PROVIDER_SITE_OTHER): Payer: PPO | Admitting: Physician Assistant

## 2019-05-14 ENCOUNTER — Other Ambulatory Visit: Payer: Self-pay

## 2019-05-14 ENCOUNTER — Encounter: Payer: Self-pay | Admitting: Physician Assistant

## 2019-05-14 VITALS — BP 145/74 | HR 54 | Ht 65.0 in | Wt 194.0 lb

## 2019-05-14 DIAGNOSIS — E785 Hyperlipidemia, unspecified: Secondary | ICD-10-CM

## 2019-05-14 DIAGNOSIS — I739 Peripheral vascular disease, unspecified: Secondary | ICD-10-CM

## 2019-05-14 DIAGNOSIS — R0602 Shortness of breath: Secondary | ICD-10-CM

## 2019-05-14 DIAGNOSIS — I2510 Atherosclerotic heart disease of native coronary artery without angina pectoris: Secondary | ICD-10-CM

## 2019-05-14 DIAGNOSIS — Z7189 Other specified counseling: Secondary | ICD-10-CM

## 2019-05-14 DIAGNOSIS — I10 Essential (primary) hypertension: Secondary | ICD-10-CM

## 2019-05-14 NOTE — Patient Instructions (Addendum)
Medication Instructions:  Your physician recommends that you continue on your current medications as directed. Please refer to the Current Medication list given to you today.  If you need a refill on your cardiac medications before your next appointment, please call your pharmacy.   Lab work: bnp on same day lexiscan   If you have labs (blood work) drawn today and your tests are completely normal, you will receive your results only by: Marland Kitchen MyChart Message (if you have MyChart) OR . A paper copy in the mail If you have any lab test that is abnormal or we need to change your treatment, we will call you to review the results.  Testing/Procedures:Your physician has requested that you have a lexiscan myoview. For further information please visit [email or web address]. Please follow instruction sheet, as given.   Follow-Up: in 4 weeks in office with Burt Knack or Kathlen Mody     Any Other Special Instructions Will Be Listed Below (If Applicable).

## 2019-05-16 DIAGNOSIS — M5416 Radiculopathy, lumbar region: Secondary | ICD-10-CM | POA: Diagnosis not present

## 2019-05-17 ENCOUNTER — Telehealth (HOSPITAL_COMMUNITY): Payer: Self-pay | Admitting: *Deleted

## 2019-05-17 ENCOUNTER — Telehealth: Payer: Self-pay | Admitting: *Deleted

## 2019-05-17 NOTE — Telephone Encounter (Signed)
lvm on patient to confirm appointment on 7-22- at 11:45 am

## 2019-05-17 NOTE — Telephone Encounter (Signed)
Left message on voicemail per DPR in reference to upcoming appointment scheduled on 05/21/19 at 10:30 with detailed instructions given per Myocardial Perfusion Study Information Sheet for the test. LM to arrive 15 minutes early, and that it is imperative to arrive on time for appointment to keep from having the test rescheduled. If you need to cancel or reschedule your appointment, please call the office within 24 hours of your appointment. Failure to do so may result in a cancellation of your appointment, and a $50 no show fee. Phone number given for call back for any questions.

## 2019-05-21 ENCOUNTER — Other Ambulatory Visit: Payer: Self-pay | Admitting: Orthopedic Surgery

## 2019-05-21 ENCOUNTER — Encounter (HOSPITAL_COMMUNITY): Payer: PPO

## 2019-05-21 ENCOUNTER — Other Ambulatory Visit: Payer: PPO

## 2019-05-21 DIAGNOSIS — M545 Low back pain, unspecified: Secondary | ICD-10-CM

## 2019-05-23 ENCOUNTER — Other Ambulatory Visit: Payer: PPO

## 2019-05-24 ENCOUNTER — Other Ambulatory Visit: Payer: PPO

## 2019-05-29 ENCOUNTER — Telehealth (HOSPITAL_COMMUNITY): Payer: Self-pay | Admitting: *Deleted

## 2019-05-29 NOTE — Telephone Encounter (Signed)
Patient given detailed instructions per Myocardial Perfusion Study Information Sheet for the test on 06/04/19 at 10:45. Patient notified to arrive 15 minutes early and that it is imperative to arrive on time for appointment to keep from having the test rescheduled.  If you need to cancel or reschedule your appointment, please call the office within 24 hours of your appointment. . Patient verbalized understanding.Douglas Christensen

## 2019-05-30 ENCOUNTER — Encounter (HOSPITAL_COMMUNITY): Payer: PPO

## 2019-05-30 ENCOUNTER — Other Ambulatory Visit: Payer: PPO

## 2019-06-04 ENCOUNTER — Ambulatory Visit (HOSPITAL_COMMUNITY): Payer: PPO | Attending: Internal Medicine

## 2019-06-04 ENCOUNTER — Other Ambulatory Visit: Payer: Self-pay

## 2019-06-04 ENCOUNTER — Other Ambulatory Visit: Payer: PPO

## 2019-06-04 DIAGNOSIS — I2510 Atherosclerotic heart disease of native coronary artery without angina pectoris: Secondary | ICD-10-CM

## 2019-06-04 DIAGNOSIS — I10 Essential (primary) hypertension: Secondary | ICD-10-CM | POA: Diagnosis not present

## 2019-06-04 DIAGNOSIS — R0602 Shortness of breath: Secondary | ICD-10-CM

## 2019-06-04 LAB — BASIC METABOLIC PANEL
BUN/Creatinine Ratio: 24 (ref 10–24)
BUN: 25 mg/dL (ref 8–27)
CO2: 20 mmol/L (ref 20–29)
Calcium: 9.8 mg/dL (ref 8.6–10.2)
Chloride: 102 mmol/L (ref 96–106)
Creatinine, Ser: 1.03 mg/dL (ref 0.76–1.27)
GFR calc Af Amer: 86 mL/min/{1.73_m2} (ref >59)
GFR calc non Af Amer: 74 mL/min/{1.73_m2} (ref >59)
Glucose: 98 mg/dL (ref 65–99)
Potassium: 4.1 mmol/L (ref 3.5–5.2)
Sodium: 138 mmol/L (ref 134–144)

## 2019-06-04 LAB — PRO B NATRIURETIC PEPTIDE: NT-Pro BNP: 93 pg/mL (ref 0–376)

## 2019-06-04 MED ORDER — TECHNETIUM TC 99M TETROFOSMIN IV KIT
30.9000 | PACK | Freq: Once | INTRAVENOUS | Status: AC | PRN
  Administered 2019-06-04: 30.9 via INTRAVENOUS
  Filled 2019-06-04: qty 31

## 2019-06-04 MED ORDER — REGADENOSON 0.4 MG/5ML IV SOLN
0.4000 mg | Freq: Once | INTRAVENOUS | Status: AC
  Administered 2019-06-04: 0.4 mg via INTRAVENOUS

## 2019-06-04 MED ORDER — TECHNETIUM TC 99M TETROFOSMIN IV KIT
9.6000 | PACK | Freq: Once | INTRAVENOUS | Status: AC | PRN
  Administered 2019-06-04: 9.6 via INTRAVENOUS
  Filled 2019-06-04: qty 10

## 2019-06-05 ENCOUNTER — Encounter: Payer: Self-pay | Admitting: Physician Assistant

## 2019-06-05 LAB — MYOCARDIAL PERFUSION IMAGING
LV dias vol: 139 mL (ref 62–150)
LV sys vol: 55 mL
Peak HR: 83 {beats}/min
Rest HR: 50 {beats}/min
SDS: 1
SRS: 0
SSS: 1
TID: 1.14

## 2019-06-10 ENCOUNTER — Other Ambulatory Visit: Payer: Self-pay | Admitting: Physician Assistant

## 2019-06-10 DIAGNOSIS — G4733 Obstructive sleep apnea (adult) (pediatric): Secondary | ICD-10-CM | POA: Diagnosis not present

## 2019-06-10 DIAGNOSIS — G4730 Sleep apnea, unspecified: Secondary | ICD-10-CM | POA: Diagnosis not present

## 2019-06-18 ENCOUNTER — Telehealth: Payer: Self-pay | Admitting: Physician Assistant

## 2019-06-18 NOTE — Telephone Encounter (Signed)
New Message ° ° ° °Left message to confirm appt and answer covid questions  °

## 2019-06-18 NOTE — Progress Notes (Deleted)
Cardiology Office Note:    Date:  06/18/2019   ID:  Douglas Christensen, DOB Apr 10, 1951, MRN [id number]  PCP:  Cassandria Anger, MD  Cardiologist:  Sherren Mocha, MD *** Electrophysiologist:  None   Referring MD: Cassandria Anger, MD   No chief complaint on file. ***  History of Present Illness:    Douglas Christensen is a 68 y.o. male with ***  CAD s/p CABG in 04/2013  PAD ? s/p R SFA stent and L CIA stent in 2008 ? L EIA stent 2012 ? L EIA/CFA/PFA/SFA endarterectomy ? L SFA stent in 3/16)  second degree heart block type 1 ? 2-1 heart block in 06/2017 >> resolved with stopping beta-blocker   Mobitz 1  Hypertension    Hyperlipidemia   OSA  GERD,   prior ETOH abuse.    Douglas Christensen was last seen in 04/2019 via Telemedicine.  He complained of worsening shortness of breath. A BNP was normal.  A stress test was done and showed inferior scar with peri-infarct ischemia.  The findings were unchanged from the prior study and medical Rx was continued.  He returns for follow up.   ***  Prior CV studies:   The following studies were reviewed today:  Myoview 06/04/2019 Medium size defect in the inferior wall from apex to base, with partial reversibility from moderate to mild, suggesting peri-infarct ischemia.  EF 60  Intermediate risk Compared to prior study, no significant change has occurred. Extracardiac uptake is also seen on today's exam and may be contributing in a similar fashion to attenuation artifact.   Holter2/13/19 1. The basic rhythm is normal sinus with an average heart rate of 72 bpm 2. There are frequent PVCs (5% burden) 3. There is second-degree type I AV block and periods of 2: 1 heart block  Echo 01/22/18 Mild LVH, EF 33-54, grade 2 diastolic dysfunction, mild RAE  Nuclear stress test 01/10/18 Inferior/inferolateral/apical inferior defect with minimal reversibility and anteroseptal wall-likely diaphragmatic attenuation; cannot rule out infarct with  peri-infarct ischemia; sinus rhythm with PVCs, PACs and intermittent type I second-degree AV block which improved with increased heart rate; intermediate risk  Echo 07/2014 EF 55-60%, Gr 2 DD Mild BAE  LHC (6/14): prox and mid LM 40-50%, prox LAD 70%, ostial Dx 75%, mid LAD 90-95%, prox CFX 60-70%, mid RCA occluded, EF 55-65% >>> CABG  Carotid US (1/14): Bilateral ICA 1-39%  Carotid US (1/14): Bilateral ICA 1-39%  Past Medical History:  Diagnosis Date   Anxiety    Arthritis    CAD (coronary artery disease)    Mild plaque (cath "years ago"); abnormal Myoview 04/2013 with subsequent CABG x 5 with LIMA to LAD, SVG to OM1, SVG to DX, SVG to PD & PL.    Cataract    COPD (chronic obstructive pulmonary disease) (HCC)    GERD (gastroesophageal reflux disease)    History of colonic polyps    History of echocardiogram    Echo 2/19: Mild LVH, EF 60-65, normal wall motion, grade 2 diastolic dysfunction, mild RAE   History of nuclear stress test    Myoview 2/19: inf/inf-lat/apical inf/ant-sept defect (?diaph atten - cannot rule out peri-infarct ischemia), PVCs/PACs/Mobitz 1 // Myoview 05/2019: EF 60, inf infarct with mild peri-infarct ischemia; no significant change when compared to prior study; Intermediate Risk   Hx of echocardiogram    Echo (9/15):  EF 55-60%; Gr 2 DD, mild BAE   Hyperlipidemia    Hypertension    LBP (low  back pain)    Meralgia paresthetica of left side 2011   PVD (peripheral vascular disease) ([place])    Stent to left common femoral and right superficial femoral.  2008.  50%  left renal    Second degree AV block, Mobitz type I    Holter 2/19: Sinus rhythm, average heart rate 72, frequent PVCs (burden 5%), second-degree type I AV block and periods of 2:1 heart block >> continue clinical managment and avoid AVN blocking agents   Shortness of breath    "once in awhile; can happen at anytime" (08/26/2013)   Sleep apnea    mod OSA, central sleep  apnea/hypoapnea syndrome 11/22/12, CPAP every night    Vitamin D deficiency    Surgical Hx: The patient  has a past surgical history that includes lower extremity stents; Femoral-popliteal Bypass Graft (12/27/2012); Cardiac catheterization (05/02/13); Coronary artery bypass graft (N/A, 05/06/2013); lower extremity angiogram (Bilateral, 11/16/2011); abdominal aortagram (N/A, 11/16/2011); percutaneous stent intervention (Left, 11/16/2011); abdominal aortagram (N/A, 11/14/2012); abdominal aortagram (N/A, 12/30/2014); Endarterectomy femoral (Left, 01/29/2015); Iliac atherectomy (Left, 01/29/2015); Tonsillectomy; Total hip arthroplasty (Left, 06/29/2015); and Eye surgery (03/24/16).   Current Medications: No outpatient medications have been marked as taking for the 06/19/19 encounter (Appointment) with Richardson Dopp T, PA-C.     Allergies:   Roxicodone [oxycodone hcl], Benazepril, Doxycycline, Itraconazole, Lipitor [atorvastatin calcium], and Plavix [clopidogrel bisulfate]   Social History   Tobacco Use   Smoking status: Former Smoker    Packs/day: 2.00    Years: 47.00    Pack years: 94.00    Types: Cigarettes    Quit date: 12/17/2012    Years since quitting: 6.5   Smokeless tobacco: Never Used  Substance Use Topics   Alcohol use: Yes    Alcohol/week: 4.0 standard drinks    Types: 4 Shots of liquor per week    Comment: 08/26/2013 "3-4 mixed drinks/wk"   Drug use: No     Family Hx: The patient's family history includes Alzheimer's disease in his father; Cancer in his father and mother; Heart disease in his mother; Hyperlipidemia in his mother; Hypertension in his mother. There is no history of Colon cancer, Heart attack, Esophageal cancer, Liver cancer, Rectal cancer, Stomach cancer, or Pancreatic cancer.  ROS:   Please see the history of present illness.    ROS All other systems reviewed and are negative.   EKGs/Labs/Other Test Reviewed:    EKG:  EKG is *** ordered today.  The ekg ordered  today demonstrates ***  Recent Labs: 05/03/2019: ALT 32; Hemoglobin 15.4; Platelet Count 215 06/04/2019: BUN 25; Creatinine, Ser 1.03; NT-Pro BNP 93; Potassium 4.1; Sodium 138   Recent Lipid Panel Lab Results  Component Value Date/Time   CHOL 134 04/02/2019 09:56 AM   TRIG 214 (H) 04/02/2019 09:56 AM   TRIG 118 09/25/2006 10:02 AM   HDL 40 04/02/2019 09:56 AM   CHOLHDL 3.4 04/02/2019 09:56 AM   CHOLHDL 5 06/07/2018 11:21 AM   LDLCALC 51 04/02/2019 09:56 AM   LDLDIRECT 148.0 06/07/2018 11:21 AM    Physical Exam:    VS:  There were no vitals taken for this visit.    Wt Readings from Last 3 Encounters:  06/04/19 194 lb (88 kg)  05/14/19 194 lb (88 kg)  05/03/19 196 lb 1.9 oz (89 kg)     ***Physical Exam  ASSESSMENT & PLAN:    *** Coronary artery disease involving native coronary artery of native heart without angina pectoris / Shortness of breath -  Plan: History of CABG in 2014.  Myoview November 2019 was negative for ischemia.  His anginal equivalent prior to his bypass was shortness of breath.  He is concerned that his worsening shortness of breath may be an indicator that he has any blockage.  Since it has been more than 1 year since his stress test, I think it is reasonable to proceed with a repeat Lexiscan Myoview.  He also has a history of moderate diastolic dysfunction on echocardiogram.  Recent creatinine and potassium were normal.  I will also obtain a BNP.  If this is elevated, he will need to change Maxide to furosemide.  We will plan on an in person return visit with me or Dr. Burt Knack in about 4 weeks.             -Arrange Lexiscan Myoview             -Obtain BNP             -If BNP significantly elevated, change Maxide to furosemide             -Follow-up 4 weeks  Essential hypertension -  Plan: He notes continued blood pressure elevation throughout the day.  I have asked him to change at the timing of his medications.  He will start taking losartan altogether as well  as amlodipine in the evening.  He will continue on hydralazine 3 times a day.  He will take Maxide in the morning.  His blood pressure continues to remain uncontrolled, consider adjusting hydralazine.  Hyperlipidemia, unspecified hyperlipidemia type -  Plan: Recent LDL optimal.  Continue current therapy.  Peripheral vascular disease ([place]) -  Plan: Continue follow-up with vascular surgery.  Dispo:  No follow-ups on file.   Medication Adjustments/Labs and Tests Ordered: Current medicines are reviewed at length with the patient today.  Concerns regarding medicines are outlined above.  Tests Ordered: No orders of the defined types were placed in this encounter.  Medication Changes: No orders of the defined types were placed in this encounter.   Signed, Richardson Dopp, PA-C  06/18/2019 9:33 PM    [facility] [address], [place], [place]  [address] Phone: [phone]; Fax: [phone]

## 2019-06-19 ENCOUNTER — Ambulatory Visit: Payer: PPO | Admitting: Physician Assistant

## 2019-06-25 ENCOUNTER — Other Ambulatory Visit: Payer: Self-pay

## 2019-06-25 ENCOUNTER — Ambulatory Visit (INDEPENDENT_AMBULATORY_CARE_PROVIDER_SITE_OTHER): Payer: PPO | Admitting: Internal Medicine

## 2019-06-25 ENCOUNTER — Encounter: Payer: Self-pay | Admitting: Internal Medicine

## 2019-06-25 DIAGNOSIS — M545 Low back pain, unspecified: Secondary | ICD-10-CM

## 2019-06-25 DIAGNOSIS — M5440 Lumbago with sciatica, unspecified side: Secondary | ICD-10-CM | POA: Diagnosis not present

## 2019-06-25 DIAGNOSIS — E538 Deficiency of other specified B group vitamins: Secondary | ICD-10-CM | POA: Diagnosis not present

## 2019-06-25 DIAGNOSIS — I2510 Atherosclerotic heart disease of native coronary artery without angina pectoris: Secondary | ICD-10-CM | POA: Diagnosis not present

## 2019-06-25 DIAGNOSIS — I739 Peripheral vascular disease, unspecified: Secondary | ICD-10-CM

## 2019-06-25 DIAGNOSIS — G8929 Other chronic pain: Secondary | ICD-10-CM

## 2019-06-25 MED ORDER — LOSARTAN POTASSIUM 25 MG PO TABS: 25.0000 mg | ORAL_TABLET | Freq: Two times a day (BID) | ORAL | 3 refills | Status: DC

## 2019-06-25 MED ORDER — LOSARTAN POTASSIUM 25 MG PO TABS: 50.0000 mg | ORAL_TABLET | Freq: Two times a day (BID) | ORAL | 3 refills | Status: DC

## 2019-06-25 MED ORDER — TRAMADOL HCL 50 MG PO TABS: 50.0000 mg | ORAL_TABLET | Freq: Four times a day (QID) | ORAL | 0 refills | Status: AC | PRN

## 2019-06-25 NOTE — Patient Instructions (Signed)

## 2019-06-25 NOTE — Assessment & Plan Note (Addendum)
Seeing Dr Mayer Camel LS MRI pending Tramadol prn  Potential benefits of a short or long term opioids use as well as potential risks (i.e. addiction risk, apnea etc) and complications (i.e. Somnolence, constipation and others) were explained to the patient and were aknowledged.

## 2019-06-25 NOTE — Assessment & Plan Note (Signed)
Lipitor, ASA 

## 2019-06-25 NOTE — Assessment & Plan Note (Signed)
On B12 

## 2019-06-25 NOTE — Progress Notes (Signed)
Subjective:  Patient ID: Douglas Christensen, male    DOB: [birth year]  Age: 68 y.o. MRN: [id number]  CC: No chief complaint on file.   HPI Douglas Christensen presents for B burning feet pain - not better, polycythemia, anxiety, PVD f/u F/u LBP  Outpatient Medications Prior to Visit  Medication Sig Dispense Refill  . aspirin EC 81 MG tablet Take 1 tablet (81 mg total) by mouth daily. 90 tablet 3  . cholecalciferol (VITAMIN D) 1000 units tablet Take 1,000 Units by mouth 2 (two) times daily.     . clonazePAM (KLONOPIN) 0.5 MG tablet TAKE ONE TABLET TWICE DAILY AS NEEDED FOR ANXIETY 60 tablet 3  . hydrALAZINE (APRESOLINE) 50 MG tablet TAKE 1 tablet BY MOUTH THREE TIMES DAILY 270 tablet 3  . Potassium Chloride CR (MICRO-K) 8 MEQ CPCR capsule CR Take 2 capsules (16 mEq total) by mouth 2 (two) times daily. 120 capsule 11  . tadalafil, PAH, (ADCIRCA) 20 MG tablet Take 1 tablet (20 mg total) by mouth daily as needed for erectile dysfunction. 10 tablet 1  . triamterene-hydrochlorothiazide (MAXZIDE-25) 37.5-25 MG tablet TAKE TWO TABLETS BY MOUTH EVERY DAY 60 tablet 11  . vitamin B-12 (CYANOCOBALAMIN) 1000 MCG tablet Take 1,000 mcg by mouth daily.    Marland Kitchen losartan (COZAAR) 25 MG tablet Take 2 tablets (50 mg total) by mouth daily. 180 tablet 3  . amLODipine (NORVASC) 10 MG tablet Take 1 tablet (10 mg total) by mouth daily. 90 tablet 3  . rosuvastatin (CRESTOR) 40 MG tablet Take 0.5 tablets (20 mg total) by mouth daily. 45 tablet 3   No facility-administered medications prior to visit.     ROS: Review of Systems  Constitutional: Negative for appetite change, fatigue and unexpected weight change.  HENT: Negative for congestion, nosebleeds, sneezing, sore throat and trouble swallowing.   Eyes: Negative for itching and visual disturbance.  Respiratory: Negative for cough, shortness of breath and wheezing.   Cardiovascular: Negative for chest pain, palpitations and leg swelling.  Gastrointestinal: Negative for  abdominal distention, blood in stool, diarrhea and nausea.  Genitourinary: Negative for frequency and hematuria.  Musculoskeletal: Positive for back pain. Negative for gait problem, joint swelling and neck pain.  Skin: Negative for rash.  Neurological: Positive for numbness. Negative for dizziness, tremors, speech difficulty and weakness.  Psychiatric/Behavioral: Negative for agitation, dysphoric mood and sleep disturbance. The patient is not nervous/anxious.     Objective:  BP 140/68 (BP Location: Left Arm, Patient Position: Sitting, Cuff Size: Large)   Pulse 84   Temp 98.3 F (36.8 C) (Oral)   Ht 5\' 5"  (1.651 m)   Wt 198 lb (89.8 kg)   SpO2 96%   BMI 32.95 kg/m   BP Readings from Last 3 Encounters:  06/25/19 140/68  05/14/19 (!) 145/74  05/03/19 (!) 156/48    Wt Readings from Last 3 Encounters:  06/25/19 198 lb (89.8 kg)  06/04/19 194 lb (88 kg)  05/14/19 194 lb (88 kg)    Physical Exam Constitutional:      General: He is not in acute distress.    Appearance: He is well-developed.     Comments: NAD  Eyes:     Conjunctiva/sclera: Conjunctivae normal.     Pupils: Pupils are equal, round, and reactive to light.  Neck:     Musculoskeletal: Normal range of motion.     Thyroid: No thyromegaly.     Vascular: No JVD.  Cardiovascular:     Rate and Rhythm: Normal  rate and regular rhythm.     Heart sounds: Normal heart sounds. No murmur. No friction rub. No gallop.   Pulmonary:     Effort: Pulmonary effort is normal. No respiratory distress.     Breath sounds: Normal breath sounds. No wheezing or rales.  Chest:     Chest wall: No tenderness.  Abdominal:     General: Bowel sounds are normal. There is no distension.     Palpations: Abdomen is soft. There is no mass.     Tenderness: There is no abdominal tenderness. There is no guarding or rebound.  Musculoskeletal: Normal range of motion.        General: Tenderness present.  Lymphadenopathy:     Cervical: No cervical  adenopathy.  Skin:    General: Skin is warm and dry.     Findings: No rash.  Neurological:     Mental Status: He is alert and oriented to person, place, and time.     Cranial Nerves: No cranial nerve deficit.     Motor: No abnormal muscle tone.     Coordination: Coordination normal.     Gait: Gait normal.     Deep Tendon Reflexes: Reflexes are normal and symmetric.  Psychiatric:        Behavior: Behavior normal.        Thought Content: Thought content normal.        Judgment: Judgment normal.   LS tender No LE edema  Lab Results  Component Value Date   WBC 8.5 05/03/2019   HGB 15.4 05/03/2019   HCT 44.0 05/03/2019   PLT 215 05/03/2019   GLUCOSE 98 06/04/2019   CHOL 134 04/02/2019   TRIG 214 (H) 04/02/2019   HDL 40 04/02/2019   LDLDIRECT 148.0 06/07/2018   LDLCALC 51 04/02/2019   ALT 32 05/03/2019   AST 35 05/03/2019   NA 138 06/04/2019   K 4.1 06/04/2019   CL 102 06/04/2019   CREATININE 1.03 06/04/2019   BUN 25 06/04/2019   CO2 20 06/04/2019   TSH 2.60 03/20/2018   PSA 0.87 03/06/2017   INR 1.10 06/22/2015   HGBA1C 5.7 03/06/2017    No results found.  Assessment & Plan:   Diagnoses and all orders for this visit:  Coronary artery disease involving native coronary artery of native heart without angina pectoris  Peripheral vascular disease (HCC)  Chronic midline low back pain with sciatica, sciatica laterality unspecified  Chronic bilateral low back pain, unspecified whether sciatica present  B12 deficiency  Other orders -     traMADol (ULTRAM) 50 MG tablet; Take 1 tablet (50 mg total) by mouth every 6 (six) hours as needed for up to 5 days for severe pain. -     losartan (COZAAR) 25 MG tablet; Take 2 tablets (50 mg total) by mouth 2 (two) times a day.     Meds ordered this encounter  Medications  . traMADol (ULTRAM) 50 MG tablet    Sig: Take 1 tablet (50 mg total) by mouth every 6 (six) hours as needed for up to 5 days for severe pain.     Dispense:  20 tablet    Refill:  0  . losartan (COZAAR) 25 MG tablet    Sig: Take 2 tablets (50 mg total) by mouth 2 (two) times a day.    Dispense:  180 tablet    Refill:  3     Follow-up: Return in about 3 months (around 09/25/2019) for a follow-up visit.  Alex  , MD

## 2019-06-25 NOTE — Assessment & Plan Note (Signed)
F/u w/Dr Burt Knack for B LE pain STENTs in B LEs ASA, Lipitor

## 2019-06-25 NOTE — Assessment & Plan Note (Signed)
MRI LS spine F/u w/Dr Burt Knack for B LE pain/feet pain if LS MRI is ok

## 2019-06-28 ENCOUNTER — Ambulatory Visit
Admission: RE | Admit: 2019-06-28 | Discharge: 2019-06-28 | Disposition: A | Discharge status: Home / Self Care | Payer: PPO | Source: Ambulatory Visit | Attending: Orthopedic Surgery | Admitting: Orthopedic Surgery

## 2019-06-28 ENCOUNTER — Other Ambulatory Visit: Payer: Self-pay

## 2019-06-28 DIAGNOSIS — M48061 Spinal stenosis, lumbar region without neurogenic claudication: Secondary | ICD-10-CM | POA: Diagnosis not present

## 2019-06-28 DIAGNOSIS — M545 Low back pain, unspecified: Secondary | ICD-10-CM

## 2019-07-05 ENCOUNTER — Ambulatory Visit: Payer: PPO | Admitting: Family

## 2019-07-05 ENCOUNTER — Other Ambulatory Visit: Payer: PPO

## 2019-07-15 NOTE — Progress Notes (Signed)
Cardiology Office Note:    Date:  07/16/2019   ID:  Douglas Christensen, DOB [birth year], MRN [id number]  PCP:  Cassandria Anger, MD  Cardiologist:  Sherren Mocha, MD  Electrophysiologist:  None   Referring MD: Cassandria Anger, MD   Chief Complaint  Patient presents with  . Follow-up    CAD, dyspnea  . Claudication  . AAA    History of Present Illness:    Douglas Christensen is a 68 y.o. male with:  CAD s/p CABG in 04/2013 ? Myoview 7/20: EF 60, inf scar w/ peri-infarct ischemia; unchanged from 2019  PAD ? s/p R SFA stent and L CIA stent in 2008 ? L EIA stent 2012 ? L EIA/CFA/PFA/SFA endarterectomy ? R fem-pop 2014 ? L SFA stent in 3/16)  second degree heart block type 1 ? 2-1 heart block in 06/2017 >> resolved with stopping beta-blocker   Mobitz 1  Hypertension    Hyperlipidemia   OSA  GERD,   prior ETOH abuse.   Douglas Christensen was last seen in 04/2019 via Telemedicine. He continued to note worsening shortness of breath.  A pro BNP was normal.  A Myoview demonstrated an inferior defect suggestive of scar with peri-infarct ischemia, EF 60.  This was unchanged from his prior study and medical Rx was recommended.    He returns for follow-up.  He is here alone.  His breathing remains about the same.  He has not had any chest discomfort.  He is mainly concerned about lower extremity discomfort with activity.  This reminds him of the discomfort he had prior to stenting to his lower extremity arteries.  He has also had previous lower extremity bypass by Dr. Trula Slade.  He was last seen in the vascular surgery clinic in June 2019.  Studies indicated some progression of disease and he was to see them back in June 2020.  He has not had any rest symptoms or nonhealing ulcers.  He has not had any syncope, orthopnea or significant leg swelling.  He did have a recent MRI of his lumbar spine due to his leg symptoms.  This demonstrated a 3.2 abdominal aortic aneurysm.  Prior CV studies:   The  following studies were reviewed today:  Lumbar MRI 06/28/2019 IMPRESSION: - L4-L5 spondylosis and degenerative grade 1 anterolisthesis have progressed since CT myelogram 04/20/2015. Mild bilateral subarticular and central canal narrowing at this level, as well as mild left neural foraminal narrowing.  - Moderate L5-S1 facet arthrosis has also progressed. Lumbar spondylosis is otherwise unchanged. No significant canal or neural foraminal narrowing at the remaining levels.  - Enlarged suprarenal abdominal aorta measuring 3.2 cm in diameter. Recommend followup by ultrasound in 3 years. This recommendation follows ACR consensus guidelines: White Paper of the ACR Incidental Findings Committee II on Vascular Findings. J Am Coll Radiol 2013; 10:789-794. Aortic aneurysm NOS (ICD10-I71.9)  Myoview 06/04/2019  Nuclear stress EF: 60%.  There was no ST segment deviation noted during stress.  Defect 1: There is a medium defect of moderate severity present in the basal inferior, mid inferior and apical inferior location.  Findings consistent with prior myocardial infarction with peri-infarct ischemia.  This is an intermediate risk study.  The left ventricular ejection fraction is normal (55-65%).   Medium size defect in the inferior wall from apex to base, with partial reversibility from moderate to mild, suggesting peri-infarct ischemia.  Compared to prior study, no significant change has occurred. Extracardiac uptake is also seen on today's exam  and may be contributing in a similar fashion to attenuation artifact.    Holter2/13/19 1. The basic rhythm is normal sinus with an average heart rate of 72 bpm 2. There are frequent PVCs (5% burden) 3. There is second-degree type I AV block and periods of 2: 1 heart block  Echo 01/22/18 Mild LVH, EF 83-38, grade 2 diastolic dysfunction, mild RAE  Nuclear stress test 01/10/18 Inferior/inferolateral/apical inferior defect with minimal  reversibility and anteroseptal wall-likely diaphragmatic attenuation; cannot rule out infarct with peri-infarct ischemia; sinus rhythm with PVCs, PACs and intermittent type I second-degree AV block which improved with increased heart rate; intermediate risk  Echo 07/2014 EF 55-60%, Gr 2 DD Mild BAE  LHC (6/14): prox and mid LM 40-50%, prox LAD 70%, ostial Dx 75%, mid LAD 90-95%, prox CFX 60-70%, mid RCA occluded, EF 55-65% >>> CABG  Carotid US (1/14): Bilateral ICA 1-39%  Carotid US (1/14): Bilateral ICA 1-39%  Past Medical History:  Diagnosis Date  . Anxiety   . Arthritis   . CAD (coronary artery disease)    Mild plaque (cath "years ago"); abnormal Myoview 04/2013 with subsequent CABG x 5 with LIMA to LAD, SVG to OM1, SVG to DX, SVG to PD & PL.   . Cataract   . COPD (chronic obstructive pulmonary disease) ([place])   . GERD (gastroesophageal reflux disease)   . History of colonic polyps   . History of echocardiogram    Echo 2/19: Mild LVH, EF 60-65, normal wall motion, grade 2 diastolic dysfunction, mild RAE  . History of nuclear stress test    Myoview 2/19: inf/inf-lat/apical inf/ant-sept defect (?diaph atten - cannot rule out peri-infarct ischemia), PVCs/PACs/Mobitz 1 // Myoview 05/2019: EF 60, inf infarct with mild peri-infarct ischemia; no significant change when compared to prior study; Intermediate Risk  . Hx of echocardiogram    Echo (9/15):  EF 55-60%; Gr 2 DD, mild BAE  . Hyperlipidemia   . Hypertension   . LBP (low back pain)   . Meralgia paresthetica of left side 2011  . PVD (peripheral vascular disease) ([place])    Stent to left common femoral and right superficial femoral.  2008.  50%  left renal   . Second degree AV block, Mobitz type I    Holter 2/19: Sinus rhythm, average heart rate 72, frequent PVCs (burden 5%), second-degree type I AV block and periods of 2:1 heart block >> continue clinical managment and avoid AVN blocking agents  . Shortness of breath     "once in awhile; can happen at anytime" (08/26/2013)  . Sleep apnea    mod OSA, central sleep apnea/hypoapnea syndrome 11/22/12, CPAP every night   . Vitamin D deficiency    Surgical Hx: The patient  has a past surgical history that includes lower extremity stents; Femoral-popliteal Bypass Graft (12/27/2012); Cardiac catheterization (05/02/13); Coronary artery bypass graft (N/A, 05/06/2013); lower extremity angiogram (Bilateral, 11/16/2011); abdominal aortagram (N/A, 11/16/2011); percutaneous stent intervention (Left, 11/16/2011); abdominal aortagram (N/A, 11/14/2012); abdominal aortagram (N/A, 12/30/2014); Endarterectomy femoral (Left, 01/29/2015); Iliac atherectomy (Left, 01/29/2015); Tonsillectomy; Total hip arthroplasty (Left, 06/29/2015); and Eye surgery (03/24/16).   Current Medications: Current Meds  Medication Sig  . amLODipine (NORVASC) 10 MG tablet Take 1 tablet (10 mg total) by mouth daily.  Marland Kitchen aspirin EC 81 MG tablet Take 1 tablet (81 mg total) by mouth daily.  . cholecalciferol (VITAMIN D) 1000 units tablet Take 1,000 Units by mouth 2 (two) times daily.   . clonazePAM (KLONOPIN) 0.5 MG tablet TAKE ONE  TABLET TWICE DAILY AS NEEDED FOR ANXIETY  . hydrALAZINE (APRESOLINE) 50 MG tablet TAKE 1 tablet BY MOUTH THREE TIMES DAILY  . losartan (COZAAR) 25 MG tablet Take 1 tablet (25 mg total) by mouth daily. Take 50 mg in the morning and 25 mg PM  . Potassium Chloride CR (MICRO-K) 8 MEQ CPCR capsule CR Take 2 capsules (16 mEq total) by mouth 2 (two) times daily.  . rosuvastatin (CRESTOR) 40 MG tablet Take 40 mg by mouth daily.  . tadalafil, PAH, (ADCIRCA) 20 MG tablet Take 1 tablet (20 mg total) by mouth daily as needed for erectile dysfunction.  . triamterene-hydrochlorothiazide (MAXZIDE-25) 37.5-25 MG tablet TAKE TWO TABLETS BY MOUTH EVERY DAY  . vitamin B-12 (CYANOCOBALAMIN) 1000 MCG tablet Take 1,000 mcg by mouth daily.  . [DISCONTINUED] losartan (COZAAR) 25 MG tablet Take 1 tablet (25 mg total) by  mouth 2 (two) times a day.     Allergies:   Roxicodone [oxycodone hcl], Gabapentin, Benazepril, Doxycycline, Itraconazole, Lipitor [atorvastatin calcium], and Plavix [clopidogrel bisulfate]   Social History   Tobacco Use  . Smoking status: Former Smoker    Packs/day: 2.00    Years: 47.00    Pack years: 94.00    Types: Cigarettes    Quit date: 12/17/2012    Years since quitting: 6.5  . Smokeless tobacco: Never Used  Substance Use Topics  . Alcohol use: Yes    Alcohol/week: 4.0 standard drinks    Types: 4 Shots of liquor per week    Comment: 08/26/2013 "3-4 mixed drinks/wk"  . Drug use: No     Family Hx: The patient's family history includes Alzheimer's disease in his father; Cancer in his father and mother; Heart disease in his mother; Hyperlipidemia in his mother; Hypertension in his mother. There is no history of Colon cancer, Heart attack, Esophageal cancer, Liver cancer, Rectal cancer, Stomach cancer, or Pancreatic cancer.  ROS:   Please see the history of present illness.    ROS All other systems reviewed and are negative.   EKGs/Labs/Other Test Reviewed:    EKG:  EKG is  ordered today.  The ekg ordered today demonstrates sinus rhythm, second-degree AV block type I, PR 264, PVCs, QTC 424, similar to prior tracings  Recent Labs: 05/03/2019: ALT 32; Hemoglobin 15.4; Platelet Count 215 06/04/2019: BUN 25; Creatinine, Ser 1.03; NT-Pro BNP 93; Potassium 4.1; Sodium 138   Recent Lipid Panel Lab Results  Component Value Date/Time   CHOL 134 04/02/2019 09:56 AM   TRIG 214 (H) 04/02/2019 09:56 AM   TRIG 118 09/25/2006 10:02 AM   HDL 40 04/02/2019 09:56 AM   CHOLHDL 3.4 04/02/2019 09:56 AM   CHOLHDL 5 06/07/2018 11:21 AM   LDLCALC 51 04/02/2019 09:56 AM   LDLDIRECT 148.0 06/07/2018 11:21 AM    Physical Exam:    VS:  BP (!) 146/60   Pulse 74   Ht 5\' 5"  (1.651 m)   Wt 198 lb 1.9 oz (89.9 kg)   SpO2 97%   BMI 32.97 kg/m     Wt Readings from Last 3 Encounters:   07/16/19 198 lb 1.9 oz (89.9 kg)  06/25/19 198 lb (89.8 kg)  06/04/19 194 lb (88 kg)     Physical Exam  Constitutional: He is oriented to person, place, and time. He appears well-developed and well-nourished. No distress.  HENT:  Head: Normocephalic and atraumatic.  Eyes: No scleral icterus.  Neck: No JVD present. Carotid bruit is not present. No thyromegaly present.  Cardiovascular: Normal  rate. An irregular rhythm present.  Murmur heard.  Low-pitched systolic murmur is present with a grade of 1/6 at the upper right sternal border. Pulses:      Carotid pulses are on the right side with bruit.      Femoral pulses are 0 on the right side and 2+ on the left side.      Dorsalis pedis pulses are 0 on the right side and 1+ on the left side.       Posterior tibial pulses are 0 on the right side and 1+ on the left side.  Pulmonary/Chest: Effort normal and breath sounds normal. He has no rales.  Abdominal: Soft. There is no hepatomegaly.  Musculoskeletal:        General: No edema.  Lymphadenopathy:    He has no cervical adenopathy.  Neurological: He is alert and oriented to person, place, and time.  Skin: Skin is warm and dry.  Psychiatric: He has a normal mood and affect.    ASSESSMENT & PLAN:    1. Coronary artery disease involving native coronary artery of native heart without angina pectoris History of CABG in 2014.  Recent nuclear stress test demonstrated inferior scar with peri-infarct ischemia.  There was no significant ischemia elsewhere.  Continued medical therapy has been recommended.  Continue amlodipine, aspirin, rosuvastatin.  2. Shortness of breath EF was normal on recent nuclear stress test.  He smoked cigarettes for over 40 years.  I suspect he probably has an element of COPD.  He plans to schedule follow-up with his pulmonologist soon for sleep apnea.  I have asked him to discuss this further with Dr. Halford Chessman.  He may benefit from proceeding with pulmonary function testing.   3. Essential hypertension Blood pressure above target.  Increase losartan to 50 mg in the morning and 25 mg in the evening.  Arrange BMET in 2 weeks.  4. Hyperlipidemia, unspecified hyperlipidemia type LDL optimal on most recent lab work.  Continue current Rx.    5. PAD (peripheral artery disease) ([place]) He notes worsening claudication in bilateral lower extremities.  His pulses are significantly diminished on the right.  It looks like his studies with vascular surgery in 2018/06/05 indicated worse disease on the right.  He is overdue for follow-up and I will make arrangements for follow-up with vascular surgery.  6. Mobitz type 1 second degree atrioventricular block He remains asymptomatic.  He is not on beta-blocker therapy secondary to this.  7. AAA (abdominal aortic aneurysm) without rupture (HCC) 3.2 cm by recent lumbar spine MRI.  We will arrange formal abdominal aortic aneurysm ultrasound.  He will likely require follow-up in 1 to 2 years.  As noted, he is not on beta-blocker secondary to second-degree AV block type I.  8. Carotid bruit, unspecified laterality R Bruit on exam.  He has not had carotid ultrasound in many years.  Carotid US will be arranged at the time of his AAA ultrasound.   Dispo:  Return in about 6 months (around 01/16/2020) for Routine Follow Up, w/ Dr. Burt Knack, or Richardson Dopp, PA-C.   Medication Adjustments/Labs and Tests Ordered: Current medicines are reviewed at length with the patient today.  Concerns regarding medicines are outlined above.  Tests Ordered: Orders Placed This Encounter  Procedures  . Basic metabolic panel  . EKG 12-Lead   Medication Changes: Meds ordered this encounter  Medications  . losartan (COZAAR) 25 MG tablet    Sig: Take 1 tablet (25 mg total) by mouth daily.  Take 50 mg in the morning and 25 mg PM    Dispense:  180 tablet    Refill:  3    correct    Signed, Richardson Dopp, PA-C  07/16/2019 1:06 PM    [facility] [address], [place], [place]  [address] Phone: [phone]; Fax: [phone]

## 2019-07-16 ENCOUNTER — Ambulatory Visit (INDEPENDENT_AMBULATORY_CARE_PROVIDER_SITE_OTHER): Payer: PPO | Admitting: Physician Assistant

## 2019-07-16 ENCOUNTER — Other Ambulatory Visit: Payer: Self-pay

## 2019-07-16 ENCOUNTER — Encounter: Payer: Self-pay | Admitting: Physician Assistant

## 2019-07-16 VITALS — BP 146/60 | HR 74 | Ht 65.0 in | Wt 198.1 lb

## 2019-07-16 DIAGNOSIS — I10 Essential (primary) hypertension: Secondary | ICD-10-CM

## 2019-07-16 DIAGNOSIS — I714 Abdominal aortic aneurysm, without rupture, unspecified: Secondary | ICD-10-CM

## 2019-07-16 DIAGNOSIS — R0602 Shortness of breath: Secondary | ICD-10-CM | POA: Diagnosis not present

## 2019-07-16 DIAGNOSIS — I441 Atrioventricular block, second degree: Secondary | ICD-10-CM | POA: Diagnosis not present

## 2019-07-16 DIAGNOSIS — I739 Peripheral vascular disease, unspecified: Secondary | ICD-10-CM | POA: Diagnosis not present

## 2019-07-16 DIAGNOSIS — R0989 Other specified symptoms and signs involving the circulatory and respiratory systems: Secondary | ICD-10-CM

## 2019-07-16 DIAGNOSIS — E785 Hyperlipidemia, unspecified: Secondary | ICD-10-CM | POA: Diagnosis not present

## 2019-07-16 DIAGNOSIS — I2510 Atherosclerotic heart disease of native coronary artery without angina pectoris: Secondary | ICD-10-CM

## 2019-07-16 MED ORDER — LOSARTAN POTASSIUM 25 MG PO TABS: 25.0000 mg | ORAL_TABLET | Freq: Every day | ORAL | 3 refills | Status: DC

## 2019-07-16 NOTE — Patient Instructions (Signed)
Medication Instructions:  INCREASE: Losartan to 50 mg in the morning and 25 mg PM  If you need a refill on your cardiac medications before your next appointment, please call your pharmacy.   Lab work: FUTURE: BMET in 2 weeks   If you have labs (blood work) drawn today and your tests are completely normal, you will receive your results only by: Marland Kitchen MyChart Message (if you have MyChart) OR . A paper copy in the mail If you have any lab test that is abnormal or we need to change your treatment, we will call you to review the results.  Testing/Procedures: Your physician has requested that you have a carotid duplex. This test is an ultrasound of the carotid arteries in your neck. It looks at blood flow through these arteries that supply the brain with blood. Allow one hour for this exam. There are no restrictions or special instructions.  Your physician has ordered for you to have a AAA duplex   Follow-Up: At [facility], you and your health needs are our priority.  As part of our continuing mission to provide you with exceptional heart care, we have created designated Provider Care Teams.  These Care Teams include your primary Cardiologist (physician) and Advanced Practice Providers (APPs -  Physician Assistants and Nurse Practitioners) who all work together to provide you with the care you need, when you need it. You will need a follow up appointment in:  6 months.  Please call our office 2 months in advance to schedule this appointment.  You may see Sherren Mocha, MD or one of the following Advanced Practice Providers on your designated Care Team: Richardson Dopp, PA-C [place], [place] . Daune Perch, NP  Any Other Special Instructions Will Be Listed Below (If Applicable).

## 2019-07-17 ENCOUNTER — Inpatient Hospital Stay: Payer: PPO | Attending: Hematology & Oncology

## 2019-07-17 ENCOUNTER — Inpatient Hospital Stay: Payer: PPO

## 2019-07-17 ENCOUNTER — Telehealth: Payer: Self-pay | Admitting: Family

## 2019-07-17 ENCOUNTER — Inpatient Hospital Stay (HOSPITAL_BASED_OUTPATIENT_CLINIC_OR_DEPARTMENT_OTHER): Payer: PPO | Admitting: Family

## 2019-07-17 ENCOUNTER — Encounter: Payer: Self-pay | Admitting: Family

## 2019-07-17 VITALS — BP 156/44 | HR 50 | Temp 98.4°F | Resp 19 | Wt 198.4 lb

## 2019-07-17 DIAGNOSIS — D751 Secondary polycythemia: Secondary | ICD-10-CM

## 2019-07-17 DIAGNOSIS — R200 Anesthesia of skin: Secondary | ICD-10-CM | POA: Diagnosis not present

## 2019-07-17 DIAGNOSIS — D500 Iron deficiency anemia secondary to blood loss (chronic): Secondary | ICD-10-CM

## 2019-07-17 DIAGNOSIS — G4730 Sleep apnea, unspecified: Secondary | ICD-10-CM | POA: Insufficient documentation

## 2019-07-17 DIAGNOSIS — Z7982 Long term (current) use of aspirin: Secondary | ICD-10-CM | POA: Diagnosis not present

## 2019-07-17 DIAGNOSIS — I10 Essential (primary) hypertension: Secondary | ICD-10-CM | POA: Insufficient documentation

## 2019-07-17 DIAGNOSIS — R202 Paresthesia of skin: Secondary | ICD-10-CM | POA: Diagnosis not present

## 2019-07-17 DIAGNOSIS — E538 Deficiency of other specified B group vitamins: Secondary | ICD-10-CM | POA: Diagnosis not present

## 2019-07-17 DIAGNOSIS — F17210 Nicotine dependence, cigarettes, uncomplicated: Secondary | ICD-10-CM | POA: Insufficient documentation

## 2019-07-17 DIAGNOSIS — Z79899 Other long term (current) drug therapy: Secondary | ICD-10-CM | POA: Diagnosis not present

## 2019-07-17 LAB — RENAL FUNCTION PANEL
Albumin: 4.2 g/dL (ref 3.5–5.0)
Anion gap: 11 (ref 5–15)
BUN: 28 mg/dL — ABNORMAL HIGH (ref 8–23)
CO2: 24 mmol/L (ref 22–32)
Calcium: 9.6 mg/dL (ref 8.9–10.3)
Chloride: 101 mmol/L (ref 98–111)
Creatinine, Ser: 1.49 mg/dL — ABNORMAL HIGH (ref 0.61–1.24)
GFR calc Af Amer: 55 mL/min — ABNORMAL LOW (ref >60)
GFR calc non Af Amer: 48 mL/min — ABNORMAL LOW (ref >60)
Glucose, Bld: 124 mg/dL — ABNORMAL HIGH (ref 70–99)
Phosphorus: 2.8 mg/dL (ref 2.5–4.6)
Potassium: 4.5 mmol/L (ref 3.5–5.1)
Sodium: 136 mmol/L (ref 135–145)

## 2019-07-17 LAB — CBC WITH DIFFERENTIAL (CANCER CENTER ONLY)
Abs Immature Granulocytes: 0.04 10*3/uL (ref 0.00–0.07)
Basophils Absolute: 0.1 10*3/uL (ref 0.0–0.1)
Basophils Relative: 1 %
Eosinophils Absolute: 0.3 10*3/uL (ref 0.0–0.5)
Eosinophils Relative: 4 %
HCT: 41 % (ref 39.0–52.0)
Hemoglobin: 14.2 g/dL (ref 13.0–17.0)
Immature Granulocytes: 0 %
Lymphocytes Relative: 23 %
Lymphs Abs: 2.2 10*3/uL (ref 0.7–4.0)
MCH: 32.3 pg (ref 26.0–34.0)
MCHC: 34.6 g/dL (ref 30.0–36.0)
MCV: 93.4 fL (ref 80.0–100.0)
Monocytes Absolute: 0.7 10*3/uL (ref 0.1–1.0)
Monocytes Relative: 8 %
Neutro Abs: 6 10*3/uL (ref 1.7–7.7)
Neutrophils Relative %: 64 %
Platelet Count: 251 10*3/uL (ref 150–400)
RBC: 4.39 MIL/uL (ref 4.22–5.81)
RDW: 12.4 % (ref 11.5–15.5)
WBC Count: 9.4 10*3/uL (ref 4.0–10.5)
nRBC: 0 % (ref 0.0–0.2)

## 2019-07-17 LAB — VITAMIN B12: Vitamin B-12: 501 pg/mL (ref 180–914)

## 2019-07-17 LAB — CMP (CANCER CENTER ONLY)
ALT: 27 U/L (ref 0–44)
AST: 26 U/L (ref 15–41)
Albumin: 4.2 g/dL (ref 3.5–5.0)
Alkaline Phosphatase: 56 U/L (ref 38–126)
Anion gap: 10 (ref 5–15)
BUN: 27 mg/dL — ABNORMAL HIGH (ref 8–23)
CO2: 24 mmol/L (ref 22–32)
Calcium: 9.1 mg/dL (ref 8.9–10.3)
Chloride: 104 mmol/L (ref 98–111)
Creatinine: 1.34 mg/dL — ABNORMAL HIGH (ref 0.61–1.24)
GFR, Est AFR Am: >60 mL/min (ref >60)
GFR, Estimated: 54 mL/min — ABNORMAL LOW (ref >60)
Glucose, Bld: 127 mg/dL — ABNORMAL HIGH (ref 70–99)
Potassium: 4.3 mmol/L (ref 3.5–5.1)
Sodium: 138 mmol/L (ref 135–145)
Total Bilirubin: 0.7 mg/dL (ref 0.3–1.2)
Total Protein: 6.6 g/dL (ref 6.5–8.1)

## 2019-07-17 NOTE — Telephone Encounter (Signed)
Called and LMVM for patient regarding appointments added per 8/19 los

## 2019-07-17 NOTE — Progress Notes (Signed)
Hematology and Oncology Follow Up Visit  Douglas Christensen [id number] 1951-03-19 68 y.o. 07/17/2019   Principle Diagnosis:  Secondary polycythemia, due to smoking/sleep apnea  Current Therapy:   Phlebotomy to keep Hct < 45% EC ASA 81 mg po q day   Interim History:  Douglas Christensen is here today for follow-up. He is doing well.  He saw his cardiologist yesterday for HTN and states that his Cozaar was increased to 25 mg po TID.  He has occasional dizziness which he attributes to some of the medication he takes.  Hct today is 41%.  No bleeding. He bruises some on aspirin but not in excess.  He has had no fever, chills, n/v, cough, rash, dizziness, SOB, chest pain, palpitations, abdominal pain or changes in bowel or bladder habits.  No swelling or tenderness in his extremities at this time.  He states that he has numbness and tingling in his feet and that he has a vascular US coming up soon for further evaluation.  He is eating well and staying hydrated. His weight is stable.   ECOG Performance Status: 0 - Asymptomatic  Medications:  Allergies as of 07/17/2019      Reactions   Roxicodone [oxycodone Hcl] Other (See Comments)   hallucinations   Gabapentin    rash   Benazepril Cough   Doxycycline    Nausea, able to tolerate 1 a day   Itraconazole Nausea Only, Rash   Lipitor [atorvastatin Calcium]    cramps   Plavix [clopidogrel Bisulfate] Rash      Medication List       Accurate as of July 17, 2019 12:06 PM. If you have any questions, ask your nurse or doctor.        amLODipine 10 MG tablet Commonly known as: NORVASC Take 1 tablet (10 mg total) by mouth daily.   aspirin EC 81 MG tablet Take 1 tablet (81 mg total) by mouth daily.   cholecalciferol 1000 units tablet Commonly known as: VITAMIN D Take 1,000 Units by mouth 2 (two) times daily.   clonazePAM 0.5 MG tablet Commonly known as: KLONOPIN TAKE ONE TABLET TWICE DAILY AS NEEDED FOR ANXIETY   hydrALAZINE 50 MG tablet  Commonly known as: APRESOLINE TAKE 1 tablet BY MOUTH THREE TIMES DAILY   losartan 25 MG tablet Commonly known as: COZAAR Take 1 tablet (25 mg total) by mouth daily. Take 50 mg in the morning and 25 mg PM   Potassium Chloride CR 8 MEQ Cpcr capsule CR Commonly known as: MICRO-K Take 2 capsules (16 mEq total) by mouth 2 (two) times daily.   rosuvastatin 40 MG tablet Commonly known as: CRESTOR Take 40 mg by mouth daily.   tadalafil (PAH) 20 MG tablet Commonly known as: ADCIRCA Take 1 tablet (20 mg total) by mouth daily as needed for erectile dysfunction.   triamterene-hydrochlorothiazide 37.5-25 MG tablet Commonly known as: MAXZIDE-25 TAKE TWO TABLETS BY MOUTH EVERY DAY   vitamin B-12 1000 MCG tablet Commonly known as: CYANOCOBALAMIN Take 1,000 mcg by mouth daily.       Allergies:  Allergies  Allergen Reactions  . Roxicodone [Oxycodone Hcl] Other (See Comments)    hallucinations  . Gabapentin     rash  . Benazepril Cough  . Doxycycline     Nausea, able to tolerate 1 a day  . Itraconazole Nausea Only and Rash  . Lipitor [Atorvastatin Calcium]     cramps  . Plavix [Clopidogrel Bisulfate] Rash    Past Medical History, Surgical  history, Social history, and Family History were reviewed and updated.  Review of Systems: All other 10 point review of systems is negative.   Physical Exam:  vitals were not taken for this visit.   Wt Readings from Last 3 Encounters:  07/16/19 198 lb 1.9 oz (89.9 kg)  06/25/19 198 lb (89.8 kg)  06/04/19 194 lb (88 kg)    Ocular: Sclerae unicteric, pupils equal, round and reactive to light Ear-nose-throat: Oropharynx clear, dentition fair Lymphatic: No cervical or supraclavicular adenopathy Lungs no rales or rhonchi, good excursion bilaterally Heart regular rate and rhythm, no murmur appreciated Abd soft, nontender, positive bowel sounds, no liver or spleen tip palpated on exam, no fluid wave  MSK no focal spinal tenderness, no joint  edema Neuro: non-focal, well-oriented, appropriate affect Breasts: Deferred   Lab Results  Component Value Date   WBC 9.4 07/17/2019   HGB 14.2 07/17/2019   HCT 41.0 07/17/2019   MCV 93.4 07/17/2019   PLT 251 07/17/2019   Lab Results  Component Value Date   FERRITIN 45 05/03/2019   IRON 91 05/03/2019   TIBC 385 05/03/2019   UIBC 294 05/03/2019   IRONPCTSAT 24 05/03/2019   Lab Results  Component Value Date   RETICCTPCT 2.1 (H) 07/13/2018   RBC 4.39 07/17/2019   No results found for: Nils Pyle [facility] Lab Results  Component Value Date   IGGSERUM 858 05/01/2008   IGMSERUM 134 05/01/2008   No results found for: Odetta Pink, SPEI   Chemistry      Component Value Date/Time   NA 138 07/17/2019 1057   NA 138 06/04/2019 1046   K 4.3 07/17/2019 1057   CL 104 07/17/2019 1057   CO2 24 07/17/2019 1057   BUN 27 (H) 07/17/2019 1057   BUN 25 06/04/2019 1046   CREATININE 1.34 (H) 07/17/2019 1057      Component Value Date/Time   CALCIUM 9.1 07/17/2019 1057   ALKPHOS 56 07/17/2019 1057   AST 26 07/17/2019 1057   ALT 27 07/17/2019 1057   BILITOT 0.7 07/17/2019 1057       Impression and Plan: Douglas Christensen is a very pleasant 68 yo caucasian with secondary polycythemia.  He continues to do well and Hct is stable at 41%. No phlebotomy needed this visit.  We will plan to see him back in another 3 months.  He will contact our office with any questions or concerns. We can certainly see him sooner if needed.   Laverna Peace, NP 8/19/202012:06 PM

## 2019-07-18 LAB — FERRITIN: Ferritin: 137 ng/mL (ref 24–336)

## 2019-07-24 NOTE — H&P (View-Only) (Signed)
HISTORY AND PHYSICAL     CC:  follow up. Requesting Provider:  Cassandria Anger, MD  HPI: This is a 68 y.o. male who is here today for follow up.  who is s/p femoral endarterectomy with vein patch angioplasty, followed by atherectomy and stenting of his left superficial femoral artery on 01-29-15 by Dr. Trula Slade. This was done for claudication. Overlapping 7 mm Cordis stents were placed. The patient did well postoperatively and was discharged to home.He has a history of right femoral to above-knee popliteal artery bypass graft with vein which was performed on 12/10/2012.  In 2017, he did develop BLE swelling.  He did not have any ulcers.  He underwent CT scan, which showed patent bypass and there was no evidence of DVT.  He began wearing compressing stockings, which helped with the swelling.  He was evaluated by Dr. Trula Slade after that and he did not think he needed to undergo addition venous testing as he already had his saphenous vein removed bilaterally.    He has hx of CABG x 5 in 2014.    Pt was recently seen by Cardiology for worsening SOB.  A pro BNP was normal.  A Myoview demonstrated an inferior defect suggestive of scar with peri-infarct ischemia, EF 60.  This was unchanged from his prior study and medical Rx was recommended.  At his last visit on 8/18, his breathing was about the same without CP.  He has a normal EF.  Given his tobacco hx, it was felt this had some element of COPD and will f/u with pulmonologist soon.  He also had a right carotid bruit on exam and carotid duplex will be scheduled next month at time of AAA u/s.   At that visit, he was mainly concerned about his lower extremity discomfort with activity, which he felt was similar to before his stenting and bypass.    He recently had an MRI of his lumbar spine due to his leg sx and was found to have an AAA.  He is being scheduled for u/s next month.    Lumbar MRI 06/28/2019 IMPRESSION: - L4-L5 spondylosis and  degenerative grade 1 anterolisthesis have progressed since CT myelogram 04/20/2015. Mild bilateral subarticular and central canal narrowing at this level, as well as mild left neural foraminal narrowing.  - Moderate L5-S1 facet arthrosis has also progressed. Lumbar spondylosis is otherwise unchanged. No significant canal or neural foraminal narrowing at the remaining levels.  - Enlarged suprarenal abdominal aorta measuring 3.2 cm in diameter. Recommend followup by ultrasound in 3 years. This recommendation follows ACR consensus guidelines: White Paper of the ACR Incidental Findings Committee II on Vascular Findings. J Am Coll Radiol 2013; 10:789-794. Aortic aneurysm NOS (ICD10-I71.9)  The pt returns today for evaluation of his legs.  He states that he can walk about 155ft before both legs cramp and he has to stop.  His cramping goes away and he can continue for about the same distance before he has to stop again.   He states [place] place at the [place] and has difficulty walking to and from the beach from his house.  He states he had a wound on his right great toe but this is healing.  He also has a small scab on the left 3rd toe but this is also improving.  He states he was supposed to come see Dr. Trula Slade back in June for studies, but he was not able to get here.   He states he is getting  ultrasound tests on Tuesday.    The pt is on a statin for cholesterol management.    The pt is on an aspirin.    Other AC:  none The pt is on CCB/ARB  for hypertension.  The pt does not have diabetes. Tobacco hx:  remote   Past Medical History:  Diagnosis Date  . Anxiety   . Arthritis   . CAD (coronary artery disease)    Mild plaque (cath "years ago"); abnormal Myoview 04/2013 with subsequent CABG x 5 with LIMA to LAD, SVG to OM1, SVG to DX, SVG to PD & PL.   . Cataract   . COPD (chronic obstructive pulmonary disease) ([place])   . GERD (gastroesophageal reflux disease)   . History of colonic polyps    . History of echocardiogram    Echo 2/19: Mild LVH, EF 60-65, normal wall motion, grade 2 diastolic dysfunction, mild RAE  . History of nuclear stress test    Myoview 2/19: inf/inf-lat/apical inf/ant-sept defect (?diaph atten - cannot rule out peri-infarct ischemia), PVCs/PACs/Mobitz 1 // Myoview 05/2019: EF 60, inf infarct with mild peri-infarct ischemia; no significant change when compared to prior study; Intermediate Risk  . Hx of echocardiogram    Echo (9/15):  EF 55-60%; Gr 2 DD, mild BAE  . Hyperlipidemia   . Hypertension   . LBP (low back pain)   . Meralgia paresthetica of left side 2011  . PVD (peripheral vascular disease) ([place])    Stent to left common femoral and right superficial femoral.  2008.  50%  left renal   . Second degree AV block, Mobitz type I    Holter 2/19: Sinus rhythm, average heart rate 72, frequent PVCs (burden 5%), second-degree type I AV block and periods of 2:1 heart block >> continue clinical managment and avoid AVN blocking agents  . Shortness of breath    "once in awhile; can happen at anytime" (08/26/2013)  . Sleep apnea    mod OSA, central sleep apnea/hypoapnea syndrome 11/22/12, CPAP every night   . Vitamin D deficiency     Past Surgical History:  Procedure Laterality Date  . ABDOMINAL AORTAGRAM N/A 11/16/2011   Procedure: ABDOMINAL Maxcine Ham;  Surgeon: Sherren Mocha, MD;  Location[facility] CATH LAB;  Service: Cardiovascular;  Laterality: N/A;  . ABDOMINAL AORTAGRAM N/A 11/14/2012   Procedure: ABDOMINAL Maxcine Ham;  Surgeon: Sherren Mocha, MD;  Location: [facility] CATH LAB;  Service: Cardiovascular;  Laterality: N/A;  . ABDOMINAL AORTAGRAM N/A 12/30/2014   Procedure: ABDOMINAL Maxcine Ham;  Surgeon: Serafina Mitchell, MD;  Location: [facility] CATH LAB;  Service: Cardiovascular;  Laterality: N/A;  . CARDIAC CATHETERIZATION  05/02/13   x2   . CORONARY ARTERY BYPASS GRAFT N/A 05/06/2013   Procedure: CORONARY ARTERY BYPASS GRAFTING (CABG);  Surgeon: Melrose Nakayama, MD;   Location: [place];  Service: Open Heart Surgery;  Laterality: N/A;  Coronary artery bypass graft times five using left internal mammary artery and left greater saphenous vein via endovein harvest.  . ENDARTERECTOMY FEMORAL Left 01/29/2015   Procedure: ENDARTERECTOMY FEMORAL WITH PATCH ANGIOPLASTY;  Surgeon: Serafina Mitchell, MD;  Location: [place];  Service: Vascular;  Laterality: Left;  . EYE SURGERY  03/24/16   cataract surgery on left eye  . FEMORAL-POPLITEAL BYPASS GRAFT  12/27/2012   Procedure: BYPASS GRAFT FEMORAL-POPLITEAL ARTERY;  Surgeon: Serafina Mitchell, MD;  Location: [facility] OR;  Service: Vascular;  Laterality: Right;  using non-reversed sapphenous vein.  Marland Kitchen ILIAC ATHERECTOMY Left 01/29/2015   Procedure: SUPERFICIAL FEMORAL  ARTERY ATHERECTOMY/PERCUTANEOUS TRANSLUMINAL ANGIOPLASTY; superficial femoral artery stent;  Surgeon: Serafina Mitchell, MD;  Location: [place];  Service: Vascular;  Laterality: Left;  . LOWER EXTREMITY ANGIOGRAM Bilateral 11/16/2011   Procedure: LOWER EXTREMITY ANGIOGRAM;  Surgeon: Sherren Mocha, MD;  Location: [facility] CATH LAB;  Service: Cardiovascular;  Laterality: Bilateral;  . lower extremity stents     bilateral lower extremities x 2  . PERCUTANEOUS STENT INTERVENTION Left 11/16/2011   Procedure: PERCUTANEOUS STENT INTERVENTION;  Surgeon: Sherren Mocha, MD;  Location: [facility] CATH LAB;  Service: Cardiovascular;  Laterality: Left;  . TONSILLECTOMY    . TOTAL HIP ARTHROPLASTY Left 06/29/2015   Procedure: TOTAL HIP ARTHROPLASTY;  Surgeon: Frederik Pear, MD;  Location: [place];  Service: Orthopedics;  Laterality: Left;  LEFT TOTAL HIP ARTHROPLASTY DEPUY SROM/PINNACLE    Allergies  Allergen Reactions  . Roxicodone [Oxycodone Hcl] Other (See Comments)    hallucinations  . Gabapentin     rash  . Benazepril Cough  . Doxycycline     Nausea, able to tolerate 1 a day  . Itraconazole Nausea Only and Rash  . Lipitor [Atorvastatin Calcium]     cramps  . Plavix [Clopidogrel Bisulfate] Rash     Current Outpatient Medications  Medication Sig Dispense Refill  . amLODipine (NORVASC) 10 MG tablet Take 1 tablet (10 mg total) by mouth daily. 90 tablet 3  . aspirin EC 81 MG tablet Take 1 tablet (81 mg total) by mouth daily. 90 tablet 3  . cholecalciferol (VITAMIN D) 1000 units tablet Take 1,000 Units by mouth 2 (two) times daily.     . clonazePAM (KLONOPIN) 0.5 MG tablet TAKE ONE TABLET TWICE DAILY AS NEEDED FOR ANXIETY 60 tablet 3  . hydrALAZINE (APRESOLINE) 50 MG tablet TAKE 1 tablet BY MOUTH THREE TIMES DAILY 270 tablet 3  . losartan (COZAAR) 25 MG tablet Take 1 tablet (25 mg total) by mouth daily. Take 50 mg in the morning and 25 mg PM 180 tablet 3  . Potassium Chloride CR (MICRO-K) 8 MEQ CPCR capsule CR Take 2 capsules (16 mEq total) by mouth 2 (two) times daily. 120 capsule 11  . rosuvastatin (CRESTOR) 40 MG tablet Take 40 mg by mouth daily.    . tadalafil, PAH, (ADCIRCA) 20 MG tablet Take 1 tablet (20 mg total) by mouth daily as needed for erectile dysfunction. 10 tablet 1  . triamterene-hydrochlorothiazide (MAXZIDE-25) 37.5-25 MG tablet TAKE TWO TABLETS BY MOUTH EVERY DAY 60 tablet 11  . vitamin B-12 (CYANOCOBALAMIN) 1000 MCG tablet Take 1,000 mcg by mouth daily.     No current facility-administered medications for this visit.     Family History  Problem Relation Age of Onset  . Heart disease Mother        No clear CAD and Heart Disease before age 77  . Hypertension Mother   . Cancer Mother        ? colon ca  . Hyperlipidemia Mother   . Cancer Father        brain tumor  . Alzheimer's disease Father   . Colon cancer Neg Hx   . Heart attack Neg Hx   . Esophageal cancer Neg Hx   . Liver cancer Neg Hx   . Rectal cancer Neg Hx   . Stomach cancer Neg Hx   . Pancreatic cancer Neg Hx     Social History   Socioeconomic History  . Marital status: Widowed    Spouse name: Not on file  . Number of children:  2  . Years of education: Not on file  . Highest education level:  Not on file  Occupational History  . Occupation: English as a second language teacher: [employer]  . Financial resource strain: Not hard at all  . Food insecurity    Worry: Never true    Inability: Never true  . Transportation needs    Medical: No    Non-medical: No  Tobacco Use  . Smoking status: Former Smoker    Packs/day: 2.00    Years: 47.00    Pack years: 94.00    Types: Cigarettes    Quit date: 12/17/2012    Years since quitting: 6.6  . Smokeless tobacco: Never Used  Substance and Sexual Activity  . Alcohol use: Yes    Alcohol/week: 4.0 standard drinks    Types: 4 Shots of liquor per week    Comment: 08/26/2013 "3-4 mixed drinks/wk"  . Drug use: No  . Sexual activity: Yes  Lifestyle  . Physical activity    Days per week: 4 days    Minutes per session: 50 min  . Stress: Not at all  Relationships  . Social connections    Talks on phone: More than three times a week    Gets together: More than three times a week    Attends religious service: Never    Active member of club or organization: Yes    Attends meetings of clubs or organizations: Never    Relationship status: Not on file  . Intimate partner violence    Fear of current or ex partner: Not on file    Emotionally abused: Not on file    Physically abused: Not on file    Forced sexual activity: Not on file  Other Topics Concern  . Not on file  Social History Narrative   Widower     REVIEW OF SYSTEMS:   [X]  denotes positive finding, [ ]  denotes negative finding Cardiac  Comments:  Chest pain or chest pressure:    Shortness of breath upon exertion:    Short of breath when lying flat:    Irregular heart rhythm:        Vascular    Pain in calf, thigh, or hip brought on by ambulation: x   Pain in feet at night that wakes you up from your sleep:     Blood clot in your veins:    Leg swelling:         Pulmonary    COPD x   Productive cough:     Wheezing:         Neurologic    Sudden weakness  in arms or legs:     Sudden numbness in arms or legs:     Sudden onset of difficulty speaking or slurred speech:    Temporary loss of vision in one eye:     Problems with dizziness:         Gastrointestinal    Blood in stool:     Vomited blood:         Genitourinary    Burning when urinating:     Blood in urine:        Psychiatric    Major depression:         Hematologic    Bleeding problems:    Problems with blood clotting too easily:        Skin    Rashes or ulcers:        Constitutional  Fever or chills:      PHYSICAL EXAMINATION:  Today's Vitals   07/25/19 1442 07/25/19 1444  BP: (!) 147/56   Pulse: (!) 48   Resp: 12   Temp: 97.7 F (36.5 C)   TempSrc: Temporal   SpO2: 100%   Weight: 196 lb 9.6 oz (89.2 kg)   Height: 5\' 5"  (1.651 m)   PainSc:  8    Body mass index is 32.72 kg/m.   General:  WDWN in NAD; vital signs documented above Gait: Not observed HENT: WNL, normocephalic Pulmonary: normal non-labored breathing , without Rales, rhonchi,  wheezing Cardiac: irregular HR, without  Murmurs; with right carotid bruit Abdomen: soft, NT, no masses Skin: without rashes Vascular Exam/Pulses:  Right Left  Radial 2+ (normal) 2+ (normal)  Ulnar 2+ (normal) 2+ (normal)  Femoral 1+ (weak) 2+ (normal)  Popliteal Unable to palpate  Unable to palpate   DP monophasic 2+ (normal)  PT monophasic Brisk doppler signal   Extremities: without ischemic changes, without Gangrene , without cellulitis; without open wounds;  Musculoskeletal: no muscle wasting or atrophy  Neurologic: A&O X 3;  No focal weakness or paresthesias are detected Psychiatric:  The pt has Normal affect.   Non-Invasive Vascular Imaging:   ABI's/TBI's on 07/25/2019: Right:  0.3 Left:  0.7  Previous ABI's/TBI's June 2019: Right:  0.74 Left:  1.11   ASSESSMENT/PLAN:: 68 y.o. male who is s/p femoral endarterectomy with vein patch angioplasty, followed by atherectomy and stenting of his  left superficial femoral artery on 01-29-15 by Dr. Denton Brick for follow up for new lower extremity discomfort.    -pt with worsening lifestyle limiting claudication.  ABI's performed today reveal significant decrease from previous exam, especially on the right.  Will set up for aortogram with BLE runoff by Dr. Trula Slade -discussed with pt to protect his feet to try to prevent any wounds that may not heal.   -new finding of AAA measuring 3.2cm on recent MRI-pt is scheduled for u/s next week with cardiology -carotid bruit(right) scheduled for duplex next week at time of AAA u/s   Leontine Locket, PA-C [facility] [phone]  Clinic MD:   Oneida Alar

## 2019-07-24 NOTE — Progress Notes (Signed)
HISTORY AND PHYSICAL     CC:  follow up. Requesting Provider:  Cassandria Anger, MD  HPI: This is a 68 y.o. male who is here today for follow up.  who is s/p femoral endarterectomy with vein patch angioplasty, followed by atherectomy and stenting of his left superficial femoral artery on 01-29-15 by Dr. Trula Slade. This was done for claudication. Overlapping 7 mm Cordis stents were placed. The patient did well postoperatively and was discharged to home.He has a history of right femoral to above-knee popliteal artery bypass graft with vein which was performed on 12/10/2012.  In 2017, he did develop BLE swelling.  He did not have any ulcers.  He underwent CT scan, which showed patent bypass and there was no evidence of DVT.  He began wearing compressing stockings, which helped with the swelling.  He was evaluated by Dr. Trula Slade after that and he did not think he needed to undergo addition venous testing as he already had his saphenous vein removed bilaterally.    He has hx of CABG x 5 in 2014.    Pt was recently seen by Cardiology for worsening SOB.  A pro BNP was normal.  A Myoview demonstrated an inferior defect suggestive of scar with peri-infarct ischemia, EF 60.  This was unchanged from his prior study and medical Rx was recommended.  At his last visit on 8/18, his breathing was about the same without CP.  He has a normal EF.  Given his tobacco hx, it was felt this had some element of COPD and will f/u with pulmonologist soon.  He also had a right carotid bruit on exam and carotid duplex will be scheduled next month at time of AAA u/s.   At that visit, he was mainly concerned about his lower extremity discomfort with activity, which he felt was similar to before his stenting and bypass.    He recently had an MRI of his lumbar spine due to his leg sx and was found to have an AAA.  He is being scheduled for u/s next month.    Lumbar MRI 06/28/2019 IMPRESSION: - L4-L5 spondylosis and  degenerative grade 1 anterolisthesis have progressed since CT myelogram 04/20/2015. Mild bilateral subarticular and central canal narrowing at this level, as well as mild left neural foraminal narrowing.  - Moderate L5-S1 facet arthrosis has also progressed. Lumbar spondylosis is otherwise unchanged. No significant canal or neural foraminal narrowing at the remaining levels.  - Enlarged suprarenal abdominal aorta measuring 3.2 cm in diameter. Recommend followup by ultrasound in 3 years. This recommendation follows ACR consensus guidelines: White Paper of the ACR Incidental Findings Committee II on Vascular Findings. J Am Coll Radiol 2013; 10:789-794. Aortic aneurysm NOS (ICD10-I71.9)  The pt returns today for evaluation of his legs.  He states that he can walk about 169ft before both legs cramp and he has to stop.  His cramping goes away and he can continue for about the same distance before he has to stop again.   He states [place] place at the [place] and has difficulty walking to and from the beach from his house.  He states he had a wound on his right great toe but this is healing.  He also has a small scab on the left 3rd toe but this is also improving.  He states he was supposed to come see Dr. Trula Slade back in June for studies, but he was not able to get here.   He states he is getting  ultrasound tests on Tuesday.    The pt is on a statin for cholesterol management.    The pt is on an aspirin.    Other AC:  none The pt is on CCB/ARB  for hypertension.  The pt does not have diabetes. Tobacco hx:  remote   Past Medical History:  Diagnosis Date  . Anxiety   . Arthritis   . CAD (coronary artery disease)    Mild plaque (cath "years ago"); abnormal Myoview 04/2013 with subsequent CABG x 5 with LIMA to LAD, SVG to OM1, SVG to DX, SVG to PD & PL.   . Cataract   . COPD (chronic obstructive pulmonary disease) ([place])   . GERD (gastroesophageal reflux disease)   . History of colonic polyps    . History of echocardiogram    Echo 2/19: Mild LVH, EF 60-65, normal wall motion, grade 2 diastolic dysfunction, mild RAE  . History of nuclear stress test    Myoview 2/19: inf/inf-lat/apical inf/ant-sept defect (?diaph atten - cannot rule out peri-infarct ischemia), PVCs/PACs/Mobitz 1 // Myoview 05/2019: EF 60, inf infarct with mild peri-infarct ischemia; no significant change when compared to prior study; Intermediate Risk  . Hx of echocardiogram    Echo (9/15):  EF 55-60%; Gr 2 DD, mild BAE  . Hyperlipidemia   . Hypertension   . LBP (low back pain)   . Meralgia paresthetica of left side 2011  . PVD (peripheral vascular disease) ([place])    Stent to left common femoral and right superficial femoral.  2008.  50%  left renal   . Second degree AV block, Mobitz type I    Holter 2/19: Sinus rhythm, average heart rate 72, frequent PVCs (burden 5%), second-degree type I AV block and periods of 2:1 heart block >> continue clinical managment and avoid AVN blocking agents  . Shortness of breath    "once in awhile; can happen at anytime" (08/26/2013)  . Sleep apnea    mod OSA, central sleep apnea/hypoapnea syndrome 11/22/12, CPAP every night   . Vitamin D deficiency     Past Surgical History:  Procedure Laterality Date  . ABDOMINAL AORTAGRAM N/A 11/16/2011   Procedure: ABDOMINAL Maxcine Ham;  Surgeon: Sherren Mocha, MD;  Location[facility] CATH LAB;  Service: Cardiovascular;  Laterality: N/A;  . ABDOMINAL AORTAGRAM N/A 11/14/2012   Procedure: ABDOMINAL Maxcine Ham;  Surgeon: Sherren Mocha, MD;  Location: [facility] CATH LAB;  Service: Cardiovascular;  Laterality: N/A;  . ABDOMINAL AORTAGRAM N/A 12/30/2014   Procedure: ABDOMINAL Maxcine Ham;  Surgeon: Serafina Mitchell, MD;  Location: [facility] CATH LAB;  Service: Cardiovascular;  Laterality: N/A;  . CARDIAC CATHETERIZATION  05/02/13   x2   . CORONARY ARTERY BYPASS GRAFT N/A 05/06/2013   Procedure: CORONARY ARTERY BYPASS GRAFTING (CABG);  Surgeon: Melrose Nakayama, MD;   Location: [place];  Service: Open Heart Surgery;  Laterality: N/A;  Coronary artery bypass graft times five using left internal mammary artery and left greater saphenous vein via endovein harvest.  . ENDARTERECTOMY FEMORAL Left 01/29/2015   Procedure: ENDARTERECTOMY FEMORAL WITH PATCH ANGIOPLASTY;  Surgeon: Serafina Mitchell, MD;  Location: [place];  Service: Vascular;  Laterality: Left;  . EYE SURGERY  03/24/16   cataract surgery on left eye  . FEMORAL-POPLITEAL BYPASS GRAFT  12/27/2012   Procedure: BYPASS GRAFT FEMORAL-POPLITEAL ARTERY;  Surgeon: Serafina Mitchell, MD;  Location: [facility] OR;  Service: Vascular;  Laterality: Right;  using non-reversed sapphenous vein.  Marland Kitchen ILIAC ATHERECTOMY Left 01/29/2015   Procedure: SUPERFICIAL FEMORAL  ARTERY ATHERECTOMY/PERCUTANEOUS TRANSLUMINAL ANGIOPLASTY; superficial femoral artery stent;  Surgeon: Serafina Mitchell, MD;  Location: [place];  Service: Vascular;  Laterality: Left;  . LOWER EXTREMITY ANGIOGRAM Bilateral 11/16/2011   Procedure: LOWER EXTREMITY ANGIOGRAM;  Surgeon: Sherren Mocha, MD;  Location: [facility] CATH LAB;  Service: Cardiovascular;  Laterality: Bilateral;  . lower extremity stents     bilateral lower extremities x 2  . PERCUTANEOUS STENT INTERVENTION Left 11/16/2011   Procedure: PERCUTANEOUS STENT INTERVENTION;  Surgeon: Sherren Mocha, MD;  Location: [facility] CATH LAB;  Service: Cardiovascular;  Laterality: Left;  . TONSILLECTOMY    . TOTAL HIP ARTHROPLASTY Left 06/29/2015   Procedure: TOTAL HIP ARTHROPLASTY;  Surgeon: Frederik Pear, MD;  Location: [place];  Service: Orthopedics;  Laterality: Left;  LEFT TOTAL HIP ARTHROPLASTY DEPUY SROM/PINNACLE    Allergies  Allergen Reactions  . Roxicodone [Oxycodone Hcl] Other (See Comments)    hallucinations  . Gabapentin     rash  . Benazepril Cough  . Doxycycline     Nausea, able to tolerate 1 a day  . Itraconazole Nausea Only and Rash  . Lipitor [Atorvastatin Calcium]     cramps  . Plavix [Clopidogrel Bisulfate] Rash     Current Outpatient Medications  Medication Sig Dispense Refill  . amLODipine (NORVASC) 10 MG tablet Take 1 tablet (10 mg total) by mouth daily. 90 tablet 3  . aspirin EC 81 MG tablet Take 1 tablet (81 mg total) by mouth daily. 90 tablet 3  . cholecalciferol (VITAMIN D) 1000 units tablet Take 1,000 Units by mouth 2 (two) times daily.     . clonazePAM (KLONOPIN) 0.5 MG tablet TAKE ONE TABLET TWICE DAILY AS NEEDED FOR ANXIETY 60 tablet 3  . hydrALAZINE (APRESOLINE) 50 MG tablet TAKE 1 tablet BY MOUTH THREE TIMES DAILY 270 tablet 3  . losartan (COZAAR) 25 MG tablet Take 1 tablet (25 mg total) by mouth daily. Take 50 mg in the morning and 25 mg PM 180 tablet 3  . Potassium Chloride CR (MICRO-K) 8 MEQ CPCR capsule CR Take 2 capsules (16 mEq total) by mouth 2 (two) times daily. 120 capsule 11  . rosuvastatin (CRESTOR) 40 MG tablet Take 40 mg by mouth daily.    . tadalafil, PAH, (ADCIRCA) 20 MG tablet Take 1 tablet (20 mg total) by mouth daily as needed for erectile dysfunction. 10 tablet 1  . triamterene-hydrochlorothiazide (MAXZIDE-25) 37.5-25 MG tablet TAKE TWO TABLETS BY MOUTH EVERY DAY 60 tablet 11  . vitamin B-12 (CYANOCOBALAMIN) 1000 MCG tablet Take 1,000 mcg by mouth daily.     No current facility-administered medications for this visit.     Family History  Problem Relation Age of Onset  . Heart disease Mother        No clear CAD and Heart Disease before age 67  . Hypertension Mother   . Cancer Mother        ? colon ca  . Hyperlipidemia Mother   . Cancer Father        brain tumor  . Alzheimer's disease Father   . Colon cancer Neg Hx   . Heart attack Neg Hx   . Esophageal cancer Neg Hx   . Liver cancer Neg Hx   . Rectal cancer Neg Hx   . Stomach cancer Neg Hx   . Pancreatic cancer Neg Hx     Social History   Socioeconomic History  . Marital status: Widowed    Spouse name: Not on file  . Number of children:  2  . Years of education: Not on file  . Highest education level:  Not on file  Occupational History  . Occupation: English as a second language teacher: [employer]  . Financial resource strain: Not hard at all  . Food insecurity    Worry: Never true    Inability: Never true  . Transportation needs    Medical: No    Non-medical: No  Tobacco Use  . Smoking status: Former Smoker    Packs/day: 2.00    Years: 47.00    Pack years: 94.00    Types: Cigarettes    Quit date: 12/17/2012    Years since quitting: 6.6  . Smokeless tobacco: Never Used  Substance and Sexual Activity  . Alcohol use: Yes    Alcohol/week: 4.0 standard drinks    Types: 4 Shots of liquor per week    Comment: 08/26/2013 "3-4 mixed drinks/wk"  . Drug use: No  . Sexual activity: Yes  Lifestyle  . Physical activity    Days per week: 4 days    Minutes per session: 50 min  . Stress: Not at all  Relationships  . Social connections    Talks on phone: More than three times a week    Gets together: More than three times a week    Attends religious service: Never    Active member of club or organization: Yes    Attends meetings of clubs or organizations: Never    Relationship status: Not on file  . Intimate partner violence    Fear of current or ex partner: Not on file    Emotionally abused: Not on file    Physically abused: Not on file    Forced sexual activity: Not on file  Other Topics Concern  . Not on file  Social History Narrative   Widower     REVIEW OF SYSTEMS:   [X]  denotes positive finding, [ ]  denotes negative finding Cardiac  Comments:  Chest pain or chest pressure:    Shortness of breath upon exertion:    Short of breath when lying flat:    Irregular heart rhythm:        Vascular    Pain in calf, thigh, or hip brought on by ambulation: x   Pain in feet at night that wakes you up from your sleep:     Blood clot in your veins:    Leg swelling:         Pulmonary    COPD x   Productive cough:     Wheezing:         Neurologic    Sudden weakness  in arms or legs:     Sudden numbness in arms or legs:     Sudden onset of difficulty speaking or slurred speech:    Temporary loss of vision in one eye:     Problems with dizziness:         Gastrointestinal    Blood in stool:     Vomited blood:         Genitourinary    Burning when urinating:     Blood in urine:        Psychiatric    Major depression:         Hematologic    Bleeding problems:    Problems with blood clotting too easily:        Skin    Rashes or ulcers:        Constitutional  Fever or chills:      PHYSICAL EXAMINATION:  Today's Vitals   07/25/19 1442 07/25/19 1444  BP: (!) 147/56   Pulse: (!) 48   Resp: 12   Temp: 97.7 F (36.5 C)   TempSrc: Temporal   SpO2: 100%   Weight: 196 lb 9.6 oz (89.2 kg)   Height: 5\' 5"  (1.651 m)   PainSc:  8    Body mass index is 32.72 kg/m.   General:  WDWN in NAD; vital signs documented above Gait: Not observed HENT: WNL, normocephalic Pulmonary: normal non-labored breathing , without Rales, rhonchi,  wheezing Cardiac: irregular HR, without  Murmurs; with right carotid bruit Abdomen: soft, NT, no masses Skin: without rashes Vascular Exam/Pulses:  Right Left  Radial 2+ (normal) 2+ (normal)  Ulnar 2+ (normal) 2+ (normal)  Femoral 1+ (weak) 2+ (normal)  Popliteal Unable to palpate  Unable to palpate   DP monophasic 2+ (normal)  PT monophasic Brisk doppler signal   Extremities: without ischemic changes, without Gangrene , without cellulitis; without open wounds;  Musculoskeletal: no muscle wasting or atrophy  Neurologic: A&O X 3;  No focal weakness or paresthesias are detected Psychiatric:  The pt has Normal affect.   Non-Invasive Vascular Imaging:   ABI's/TBI's on 07/25/2019: Right:  0.3 Left:  0.7  Previous ABI's/TBI's June 2019: Right:  0.74 Left:  1.11   ASSESSMENT/PLAN:: 68 y.o. male who is s/p femoral endarterectomy with vein patch angioplasty, followed by atherectomy and stenting of his  left superficial femoral artery on 01-29-15 by Dr. Denton Brick for follow up for new lower extremity discomfort.    -pt with worsening lifestyle limiting claudication.  ABI's performed today reveal significant decrease from previous exam, especially on the right.  Will set up for aortogram with BLE runoff by Dr. Trula Slade -discussed with pt to protect his feet to try to prevent any wounds that may not heal.   -new finding of AAA measuring 3.2cm on recent MRI-pt is scheduled for u/s next week with cardiology -carotid bruit(right) scheduled for duplex next week at time of AAA u/s   Leontine Locket, PA-C [facility] [phone]  Clinic MD:   Oneida Alar

## 2019-07-25 ENCOUNTER — Encounter: Payer: Self-pay | Admitting: *Deleted

## 2019-07-25 ENCOUNTER — Ambulatory Visit (HOSPITAL_COMMUNITY)
Admission: RE | Admit: 2019-07-25 | Discharge: 2019-07-25 | Disposition: A | Discharge status: Home / Self Care | Payer: PPO | Source: Ambulatory Visit | Attending: Family | Admitting: Family

## 2019-07-25 ENCOUNTER — Other Ambulatory Visit: Payer: Self-pay

## 2019-07-25 ENCOUNTER — Ambulatory Visit: Payer: PPO | Admitting: Physician Assistant

## 2019-07-25 ENCOUNTER — Other Ambulatory Visit: Payer: Self-pay | Admitting: *Deleted

## 2019-07-25 ENCOUNTER — Other Ambulatory Visit (HOSPITAL_COMMUNITY): Payer: Self-pay | Admitting: Physician Assistant

## 2019-07-25 VITALS — BP 147/56 | HR 48 | Temp 97.7°F | Resp 12 | Ht 65.0 in | Wt 196.6 lb

## 2019-07-25 DIAGNOSIS — I739 Peripheral vascular disease, unspecified: Secondary | ICD-10-CM | POA: Diagnosis not present

## 2019-07-26 ENCOUNTER — Telehealth (HOSPITAL_COMMUNITY): Payer: Self-pay

## 2019-07-26 NOTE — Telephone Encounter (Signed)
   Left voicemail re: COVID-19 instructions.                      

## 2019-07-27 ENCOUNTER — Other Ambulatory Visit: Payer: Self-pay | Admitting: Internal Medicine

## 2019-07-29 ENCOUNTER — Ambulatory Visit (HOSPITAL_COMMUNITY): Payer: PPO

## 2019-07-29 ENCOUNTER — Ambulatory Visit: Payer: PPO | Admitting: Family

## 2019-07-29 ENCOUNTER — Other Ambulatory Visit (HOSPITAL_COMMUNITY): Payer: PPO

## 2019-07-30 ENCOUNTER — Encounter: Payer: Self-pay | Admitting: Physician Assistant

## 2019-07-30 ENCOUNTER — Other Ambulatory Visit (HOSPITAL_COMMUNITY): Payer: Self-pay | Admitting: Physician Assistant

## 2019-07-30 ENCOUNTER — Other Ambulatory Visit: Payer: PPO

## 2019-07-30 ENCOUNTER — Other Ambulatory Visit: Payer: Self-pay

## 2019-07-30 ENCOUNTER — Ambulatory Visit (HOSPITAL_BASED_OUTPATIENT_CLINIC_OR_DEPARTMENT_OTHER)
Admission: RE | Admit: 2019-07-30 | Discharge: 2019-07-30 | Disposition: A | Discharge status: Home / Self Care | Payer: PPO | Source: Ambulatory Visit | Attending: Physician Assistant | Admitting: Physician Assistant

## 2019-07-30 ENCOUNTER — Ambulatory Visit (HOSPITAL_COMMUNITY)
Admission: RE | Admit: 2019-07-30 | Discharge: 2019-07-30 | Disposition: A | Discharge status: Home / Self Care | Payer: PPO | Source: Ambulatory Visit | Attending: Internal Medicine | Admitting: Internal Medicine

## 2019-07-30 DIAGNOSIS — I714 Abdominal aortic aneurysm, without rupture, unspecified: Secondary | ICD-10-CM

## 2019-07-30 DIAGNOSIS — I10 Essential (primary) hypertension: Secondary | ICD-10-CM | POA: Diagnosis not present

## 2019-07-30 DIAGNOSIS — R0989 Other specified symptoms and signs involving the circulatory and respiratory systems: Secondary | ICD-10-CM

## 2019-07-30 DIAGNOSIS — I779 Disorder of arteries and arterioles, unspecified: Secondary | ICD-10-CM | POA: Insufficient documentation

## 2019-07-30 DIAGNOSIS — I6523 Occlusion and stenosis of bilateral carotid arteries: Secondary | ICD-10-CM

## 2019-07-30 LAB — BASIC METABOLIC PANEL
BUN/Creatinine Ratio: 30 — ABNORMAL HIGH (ref 10–24)
BUN: 38 mg/dL — ABNORMAL HIGH (ref 8–27)
CO2: 19 mmol/L — ABNORMAL LOW (ref 20–29)
Calcium: 9.6 mg/dL (ref 8.6–10.2)
Chloride: 105 mmol/L (ref 96–106)
Creatinine, Ser: 1.28 mg/dL — ABNORMAL HIGH (ref 0.76–1.27)
GFR calc Af Amer: 66 mL/min/{1.73_m2} (ref >59)
GFR calc non Af Amer: 57 mL/min/{1.73_m2} — ABNORMAL LOW (ref >59)
Glucose: 96 mg/dL (ref 65–99)
Potassium: 4.5 mmol/L (ref 3.5–5.2)
Sodium: 142 mmol/L (ref 134–144)

## 2019-08-02 ENCOUNTER — Other Ambulatory Visit (HOSPITAL_COMMUNITY)
Admission: RE | Admit: 2019-08-02 | Discharge: 2019-08-02 | Disposition: A | Discharge status: Home / Self Care | Payer: PPO | Source: Ambulatory Visit | Attending: Surgery | Admitting: Surgery

## 2019-08-02 DIAGNOSIS — Z01812 Encounter for preprocedural laboratory examination: Secondary | ICD-10-CM | POA: Insufficient documentation

## 2019-08-02 DIAGNOSIS — Z20828 Contact with and (suspected) exposure to other viral communicable diseases: Secondary | ICD-10-CM | POA: Diagnosis not present

## 2019-08-03 LAB — NOVEL CORONAVIRUS, NAA (HOSP ORDER, SEND-OUT TO REF LAB; TAT 18-24 HRS): SARS-CoV-2, NAA: NOT DETECTED

## 2019-08-06 ENCOUNTER — Encounter (HOSPITAL_COMMUNITY)
Admission: RE | Disposition: A | Discharge status: Home / Self Care | Payer: Self-pay | Source: Home / Self Care | Attending: Surgery

## 2019-08-06 ENCOUNTER — Ambulatory Visit (HOSPITAL_COMMUNITY)
Admission: RE | Admit: 2019-08-06 | Discharge: 2019-08-06 | Disposition: A | Discharge status: Home / Self Care | Payer: PPO | Attending: Surgery | Admitting: Surgery

## 2019-08-06 ENCOUNTER — Other Ambulatory Visit: Payer: Self-pay

## 2019-08-06 DIAGNOSIS — K219 Gastro-esophageal reflux disease without esophagitis: Secondary | ICD-10-CM | POA: Insufficient documentation

## 2019-08-06 DIAGNOSIS — Z87891 Personal history of nicotine dependence: Secondary | ICD-10-CM | POA: Diagnosis not present

## 2019-08-06 DIAGNOSIS — I10 Essential (primary) hypertension: Secondary | ICD-10-CM | POA: Insufficient documentation

## 2019-08-06 DIAGNOSIS — F419 Anxiety disorder, unspecified: Secondary | ICD-10-CM | POA: Diagnosis not present

## 2019-08-06 DIAGNOSIS — Z951 Presence of aortocoronary bypass graft: Secondary | ICD-10-CM | POA: Diagnosis not present

## 2019-08-06 DIAGNOSIS — I2510 Atherosclerotic heart disease of native coronary artery without angina pectoris: Secondary | ICD-10-CM | POA: Diagnosis not present

## 2019-08-06 DIAGNOSIS — J449 Chronic obstructive pulmonary disease, unspecified: Secondary | ICD-10-CM | POA: Insufficient documentation

## 2019-08-06 DIAGNOSIS — I714 Abdominal aortic aneurysm, without rupture: Secondary | ICD-10-CM | POA: Diagnosis not present

## 2019-08-06 DIAGNOSIS — E785 Hyperlipidemia, unspecified: Secondary | ICD-10-CM | POA: Insufficient documentation

## 2019-08-06 DIAGNOSIS — I739 Peripheral vascular disease, unspecified: Secondary | ICD-10-CM | POA: Insufficient documentation

## 2019-08-06 DIAGNOSIS — Z7982 Long term (current) use of aspirin: Secondary | ICD-10-CM | POA: Insufficient documentation

## 2019-08-06 DIAGNOSIS — Z79899 Other long term (current) drug therapy: Secondary | ICD-10-CM | POA: Diagnosis not present

## 2019-08-06 DIAGNOSIS — G4733 Obstructive sleep apnea (adult) (pediatric): Secondary | ICD-10-CM | POA: Insufficient documentation

## 2019-08-06 HISTORY — PX: ABDOMINAL AORTOGRAM W/LOWER EXTREMITY: CATH118223

## 2019-08-06 HISTORY — PX: PERIPHERAL VASCULAR ATHERECTOMY: CATH118256

## 2019-08-06 HISTORY — PX: PERIPHERAL VASCULAR INTERVENTION: CATH118257

## 2019-08-06 LAB — POCT ACTIVATED CLOTTING TIME
Activated Clotting Time: 268 seconds
Activated Clotting Time: 246 seconds

## 2019-08-06 LAB — POCT I-STAT, CHEM 8
BUN: 34 mg/dL — ABNORMAL HIGH (ref 8–23)
Calcium, Ion: 1.24 mmol/L (ref 1.15–1.40)
Chloride: 106 mmol/L (ref 98–111)
Creatinine, Ser: 1.3 mg/dL — ABNORMAL HIGH (ref 0.61–1.24)
Glucose, Bld: 100 mg/dL — ABNORMAL HIGH (ref 70–99)
HCT: 39 % (ref 39.0–52.0)
Hemoglobin: 13.3 g/dL (ref 13.0–17.0)
Potassium: 4.2 mmol/L (ref 3.5–5.1)
Sodium: 137 mmol/L (ref 135–145)
TCO2: 20 mmol/L — ABNORMAL LOW (ref 22–32)

## 2019-08-06 SURGERY — ABDOMINAL AORTOGRAM W/LOWER EXTREMITY
Anesthesia: LOCAL | Laterality: Right

## 2019-08-06 MED ORDER — FENTANYL CITRATE (PF) 100 MCG/2ML IJ SOLN
INTRAMUSCULAR | Status: DC | PRN
  Administered 2019-08-06: 25 ug via INTRAVENOUS
  Administered 2019-08-06: 50 ug via INTRAVENOUS
  Administered 2019-08-06 (×4): 25 ug via INTRAVENOUS

## 2019-08-06 MED ORDER — ASPIRIN EC 81 MG PO TBEC: 81.0000 mg | DELAYED_RELEASE_TABLET | Freq: Every day | ORAL | Status: DC

## 2019-08-06 MED ORDER — FENTANYL CITRATE (PF) 100 MCG/2ML IJ SOLN
INTRAMUSCULAR | Status: AC
  Filled 2019-08-06: qty 2

## 2019-08-06 MED ORDER — HEPARIN (PORCINE) IN NACL 1000-0.9 UT/500ML-% IV SOLN
INTRAVENOUS | Status: DC | PRN
  Administered 2019-08-06 (×2): 500 mL

## 2019-08-06 MED ORDER — HEPARIN (PORCINE) IN NACL 1000-0.9 UT/500ML-% IV SOLN
INTRAVENOUS | Status: AC
  Filled 2019-08-06: qty 1000

## 2019-08-06 MED ORDER — MIDAZOLAM HCL 2 MG/2ML IJ SOLN
INTRAMUSCULAR | Status: AC
  Filled 2019-08-06: qty 2

## 2019-08-06 MED ORDER — HYDRALAZINE HCL 20 MG/ML IJ SOLN: 5.0000 mg | INTRAMUSCULAR | Status: DC | PRN

## 2019-08-06 MED ORDER — SODIUM CHLORIDE 0.9 % IV SOLN: 250.0000 mL | INTRAVENOUS | Status: DC | PRN

## 2019-08-06 MED ORDER — SODIUM CHLORIDE 0.9 % IV SOLN
INTRAVENOUS | Status: DC
  Administered 2019-08-06: 09:00:00 via INTRAVENOUS

## 2019-08-06 MED ORDER — HEPARIN SODIUM (PORCINE) 1000 UNIT/ML IJ SOLN
INTRAMUSCULAR | Status: AC
  Filled 2019-08-06: qty 1

## 2019-08-06 MED ORDER — TICAGRELOR 60 MG PO TABS: 90.0000 mg | ORAL_TABLET | Freq: Two times a day (BID) | ORAL | 6 refills | Status: DC

## 2019-08-06 MED ORDER — SODIUM CHLORIDE 0.9% FLUSH: 3.0000 mL | Freq: Two times a day (BID) | INTRAVENOUS | Status: DC

## 2019-08-06 MED ORDER — MIDAZOLAM HCL 2 MG/2ML IJ SOLN
INTRAMUSCULAR | Status: AC
  Filled 2019-08-06: qty 4

## 2019-08-06 MED ORDER — HEPARIN SODIUM (PORCINE) 1000 UNIT/ML IJ SOLN
INTRAMUSCULAR | Status: DC | PRN
  Administered 2019-08-06: 9000 [IU] via INTRAVENOUS
  Administered 2019-08-06: 1000 [IU] via INTRAVENOUS

## 2019-08-06 MED ORDER — ACETAMINOPHEN 325 MG PO TABS: 650.0000 mg | ORAL_TABLET | ORAL | Status: DC | PRN

## 2019-08-06 MED ORDER — VERAPAMIL HCL 2.5 MG/ML IV SOLN
INTRAVENOUS | Status: AC
  Filled 2019-08-06: qty 2

## 2019-08-06 MED ORDER — SODIUM CHLORIDE 0.9 % WEIGHT BASED INFUSION: 1.0000 mL/kg/h | INTRAVENOUS | Status: DC

## 2019-08-06 MED ORDER — TICAGRELOR 90 MG PO TABS
ORAL_TABLET | ORAL | Status: DC | PRN
  Administered 2019-08-06: 90 mg via ORAL

## 2019-08-06 MED ORDER — VIPERSLIDE LUBRICANT OPTIME
TOPICAL | Status: DC | PRN
  Administered 2019-08-06: 100 mL via SURGICAL_CAVITY

## 2019-08-06 MED ORDER — SODIUM CHLORIDE 0.9% FLUSH: 3.0000 mL | INTRAVENOUS | Status: DC | PRN

## 2019-08-06 MED ORDER — MORPHINE SULFATE (PF) 2 MG/ML IV SOLN: 2.0000 mg | INTRAVENOUS | Status: DC | PRN

## 2019-08-06 MED ORDER — LIDOCAINE HCL (PF) 1 % IJ SOLN
INTRAMUSCULAR | Status: AC
  Filled 2019-08-06: qty 30

## 2019-08-06 MED ORDER — ONDANSETRON HCL 4 MG/2ML IJ SOLN: 4.0000 mg | Freq: Four times a day (QID) | INTRAMUSCULAR | Status: DC | PRN

## 2019-08-06 MED ORDER — MIDAZOLAM HCL 2 MG/2ML IJ SOLN
INTRAMUSCULAR | Status: DC | PRN
  Administered 2019-08-06 (×5): 1 mg via INTRAVENOUS
  Administered 2019-08-06: 2 mg via INTRAVENOUS

## 2019-08-06 MED ORDER — LIDOCAINE HCL (PF) 1 % IJ SOLN
INTRAMUSCULAR | Status: DC | PRN
  Administered 2019-08-06: 18 mL
  Administered 2019-08-06: 18 mL via INTRADERMAL

## 2019-08-06 MED ORDER — TICAGRELOR 90 MG PO TABS
ORAL_TABLET | ORAL | Status: AC
  Filled 2019-08-06: qty 1

## 2019-08-06 MED ORDER — IODIXANOL 320 MG/ML IV SOLN
INTRAVENOUS | Status: DC | PRN
  Administered 2019-08-06: 245 mL via INTRA_ARTERIAL

## 2019-08-06 MED ORDER — NITROGLYCERIN IN D5W 200-5 MCG/ML-% IV SOLN
INTRAVENOUS | Status: AC
  Filled 2019-08-06: qty 250

## 2019-08-06 MED ORDER — LABETALOL HCL 5 MG/ML IV SOLN: 10.0000 mg | INTRAVENOUS | Status: DC | PRN

## 2019-08-06 SURGICAL SUPPLY — 42 items
BAG SNAP BAND KOVER 36X36 (MISCELLANEOUS) ×1 IMPLANT
BALLN LUTONIX DCB 6X80X130 (BALLOONS) ×3
BALLN MUSTANG 10X40X135 (BALLOONS) ×3
BALLN MUSTANG 5X20X135 (BALLOONS) ×3
BALLN STERLING OTW 5X80X135 (BALLOONS) ×3
BALLOON LUTONIX DCB 6X80X130 (BALLOONS) IMPLANT
BALLOON MUSTANG 10X40X135 (BALLOONS) IMPLANT
BALLOON MUSTANG 5X20X135 (BALLOONS) IMPLANT
BALLOON STERLING OTW 5X80X135 (BALLOONS) IMPLANT
CATH ANGIO 5F BER2 100CM (CATHETERS) ×1 IMPLANT
CATH CROSS OVER TEMPO 5F (CATHETERS) ×1 IMPLANT
CATH OMNI FLUSH 5F 65CM (CATHETERS) ×1 IMPLANT
CATH QUICKCROSS .035X135CM (MICROCATHETER) ×1 IMPLANT
CATH STRAIGHT 5FR 65CM (CATHETERS) ×1 IMPLANT
CLOSURE MYNX CONTROL 6F/7F (Vascular Products) ×1 IMPLANT
COVER DOME SNAP 22 D (MISCELLANEOUS) ×1 IMPLANT
DEVICE EMBOSHIELD NAV6 4.0-7.0 (FILTER) ×1 IMPLANT
DIAMONDBACK SOLID OAS 1.5MM (CATHETERS) ×3
DIAMONDBACK SOLID OAS 2.0MM (CATHETERS) ×3
GUIDEWIRE ANGLED .035X150CM (WIRE) ×1 IMPLANT
KIT ENCORE 26 ADVANTAGE (KITS) ×1 IMPLANT
KIT MICROPUNCTURE NIT STIFF (SHEATH) ×1 IMPLANT
KIT PV (KITS) ×3 IMPLANT
LUBRICANT VIPERSLIDE CORONARY (MISCELLANEOUS) ×1 IMPLANT
SHEATH PINNACLE 5F 10CM (SHEATH) ×2 IMPLANT
SHEATH PINNACLE 7F 10CM (SHEATH) ×2 IMPLANT
SHEATH PINNACLE ST 7F 45CM (SHEATH) ×1 IMPLANT
SHEATH PROBE COVER 6X72 (BAG) ×1 IMPLANT
STENT EPIC VASCULAR 10X60X75 (Permanent Stent) ×1 IMPLANT
STENT EPIC VASCULAR 12X40X75 (Permanent Stent) ×1 IMPLANT
STENT INNOVA 6X40X130 (Permanent Stent) ×1 IMPLANT
SYR MEDRAD MARK V 150ML (SYRINGE) ×1 IMPLANT
SYSTEM DIMNDBCK SLD OAS 1.5MM (CATHETERS) IMPLANT
SYSTEM DIMNDBCK SLD OAS 2.0MM (CATHETERS) IMPLANT
TAPE VIPERTRACK RADIOPAQ (MISCELLANEOUS) IMPLANT
TAPE VIPERTRACK RADIOPAQUE (MISCELLANEOUS) ×1
TRANSDUCER W/STOPCOCK (MISCELLANEOUS) ×3 IMPLANT
TRAY PV CATH (CUSTOM PROCEDURE TRAY) ×3 IMPLANT
WIRE BENTSON .035X145CM (WIRE) ×1 IMPLANT
WIRE HI TORQ VERSACORE 300 (WIRE) ×1 IMPLANT
WIRE TORQFLEX AUST .018X40CM (WIRE) ×2 IMPLANT
WIRE VIPER ADVANCE .017X335CM (WIRE) ×2 IMPLANT

## 2019-08-06 NOTE — Discharge Instructions (Signed)
Femoral Site Care °This sheet gives you information about how to care for yourself after your procedure. Your health care provider may also give you more specific instructions. If you have problems or questions, contact your health care provider. °What can I expect after the procedure? °After the procedure, it is common to have: °· Bruising that usually fades within 1-2 weeks. °· Tenderness at the site. °Follow these instructions at home: °Wound care °· Follow instructions from your health care provider about how to take care of your insertion site. Make sure you: °? Wash your hands with soap and water before you change your bandage (dressing). If soap and water are not available, use hand sanitizer. °? Change your dressing as told by your health care provider. °? Leave stitches (sutures), skin glue, or adhesive strips in place. These skin closures may need to stay in place for 2 weeks or longer. If adhesive strip edges start to loosen and curl up, you may trim the loose edges. Do not remove adhesive strips completely unless your health care provider tells you to do that. °· Do not take baths, swim, or use a hot tub until your health care provider approves. °· You may shower 24-48 hours after the procedure or as told by your health care provider. °? Gently wash the site with plain soap and water. °? Pat the area dry with a clean towel. °? Do not rub the site. This may cause bleeding. °· Do not apply powder or lotion to the site. Keep the site clean and dry. °· Check your femoral site every day for signs of infection. Check for: °? Redness, swelling, or pain. °? Fluid or blood. °? Warmth. °? Pus or a bad smell. °Activity °· For the first 2-3 days after your procedure, or as long as directed: °? Avoid climbing stairs as much as possible. °? Do not squat. °· Do not lift anything that is heavier than 10 lb (4.5 kg), or the limit that you are told, until your health care provider says that it is safe. °· Rest as  directed. °? Avoid sitting for a long time without moving. Get up to take short walks every 1-2 hours. °· Do not drive for 24 hours if you were given a medicine to help you relax (sedative). °General instructions °· Take over-the-counter and prescription medicines only as told by your health care provider. °· Keep all follow-up visits as told by your health care provider. This is important. °Contact a health care provider if you have: °· A fever or chills. °· You have redness, swelling, or pain around your insertion site. °Get help right away if: °· The catheter insertion area swells very fast. °· You pass out. °· You suddenly start to sweat or your skin gets clammy. °· The catheter insertion area is bleeding, and the bleeding does not stop when you hold steady pressure on the area. °· The area near or just beyond the catheter insertion site becomes pale, cool, tingly, or numb. °These symptoms may represent a serious problem that is an emergency. Do not wait to see if the symptoms will go away. Get medical help right away. Call your local emergency services (911 in the [place] Do not drive yourself to the hospital. °Summary °· After the procedure, it is common to have bruising that usually fades within 1-2 weeks. °· Check your femoral site every day for signs of infection. °· Do not lift anything that is heavier than 10 lb (4.5 kg), or the   limit that you are told, until your health care provider says that it is safe. °This information is not intended to replace advice given to you by your health care provider. Make sure you discuss any questions you have with your health care provider. °Document Released: 07/18/2014 Document Revised: 11/27/2017 Document Reviewed: 11/27/2017 °Elsevier Patient Education © 2020 [employer] ° °

## 2019-08-06 NOTE — Progress Notes (Signed)
No bleeding or hematoma noted after ambulation 

## 2019-08-06 NOTE — Progress Notes (Signed)
Discharge instructions reviewed with pt and Diane Pearman. Both voice understanding. Pt states he will call his neighbor to stay with him tomorrow after Diane leaves.

## 2019-08-06 NOTE — Interval H&P Note (Signed)
History and Physical Interval Note:  08/06/2019 9:05 AM  Douglas Christensen  has presented today for surgery, with the diagnosis of PVD.  The various methods of treatment have been discussed with the patient and family. After consideration of risks, benefits and other options for treatment, the patient has consented to  Procedure(s): ABDOMINAL AORTOGRAM W/LOWER EXTREMITY (Bilateral) as a surgical intervention.  The patient's history has been reviewed, patient examined, no change in status, stable for surgery.  I have reviewed the patient's chart and labs.  Questions were answered to the patient's satisfaction.     [name]

## 2019-08-06 NOTE — Op Note (Signed)
Patient name: Douglas Christensen MRN: [id number] DOB: [birth year] Sex: male  08/06/2019 Pre-operative Diagnosis: claudication right leg Post-operative diagnosis:  Same Surgeon:  [name] Procedure Performed:  1.  Ultrasound-guided access, right femoral artery  2.  Ultrasound-guided access, left femoral artery  3.  Abdominal aortogram  4.  Bilateral lower extremity runoff  5.  Atherectomy, right common iliac artery  6.  Atherectomy, right external iliac artery  7.  Stent, right common iliac artery (12 x 40)  8.  Stent, right external iliac arteries  9.  Atherectomy, right popliteal artery  10.  Stent, right popliteal artery  11.  Conscious sedation (164 minutes)  12.  Mynx closure device x2    Indications: The patient has previously undergone multiple interventions including left iliac stenting, left femoral endarterectomy with superficial femoral artery stenting, and right femoral-popliteal bypass graft with vein.  He has had progressive symptoms and a significant decrease in his right ABIs and comes in today for further evaluation possible intervention.  Procedure:  The patient was identified in the holding area and taken to room 8.  The patient was then placed supine on the table and prepped and draped in the usual sterile fashion.  A time out was called.  Conscious sedation was administered with the use of IV fentanyl and Versed under continuous physician and nurse monitoring.  Heart rate, blood pressure, and oxygen saturation were continuously monitored.  Total sedation time was 164 minutes.  Ultrasound was used to evaluate the left common femoral artery.  It was patent .  A digital ultrasound image was acquired.  A micropuncture needle was used to access the left common femoral artery under ultrasound guidance.  An 018 wire was advanced without resistance and a micropuncture sheath was placed.  The 018 wire was removed and a benson wire was placed.  The micropuncture sheath was exchanged  for a 5 french sheath.  An omniflush catheter was advanced over the wire to the level of L-1.  An abdominal angiogram was obtained.  The catheter was pulled out of aorta bifurcation bilateral runoff was performed Findings:   Aortogram: No significant renal artery stenosis.  Infrarenal abdominal aorta is heavily calcified.  There is a slight filling defect in the infrarenal aorta however no pressure gradient is associated with this.  There is approximately a 80% stenosis and a heavily calcified right common iliac lesion as well as greater than 80% stenosis within the proximal right external iliac.  The left common and external iliac arteries appear to be widely patent.  Right Lower Extremity: Right common femoral profundofemoral artery widely patent.  The right femoral to above-knee popliteal bypass graft is widely patent there is a subtotal occlusion in the popliteal artery just beyond the bypass graft.  The anterior tibial is a dominant runoff.  Left Lower Extremity: The left femoral endarterectomy site is widely patent as is the profundofemoral artery.  The stents within the superficial femoral artery are widely patent.  There is a 50% stenosis of the popliteal artery at the level of the knee.  Posterior tibial is the dominant runoff  Intervention: After the above images were acquired, the decision made to proceed with intervention.  In order to do so I had to gain second access into the right common femoral artery which was done under ultrasound using micropuncture set up.  A 7 French sheath was placed.  I then selected a CSI 2.0 solid atherectomy device and perform atherectomy of the common iliac  and external iliac artery.  This was done low medium and high speeds.  I then elected to primarily stent this area.  A epic 12 x 40 was deployed in the common iliac and a epic 10 x 60 was deployed in the external iliac.  Both were postdilated with a 10 mm balloon.  Subsequent imaging showed resolution of the  stenosis.  A minx device was used to close the cannulation site in the right groin.  Them crossed over from the left side into the right femoral-popliteal bypass graft.  A Glidewire was used to cross the subtotal occlusion.  This was confirmed with contrast injection.  A Viper wire was placed followed by a NAV-6 filter.  I then performed atherectomy of the popliteal artery with a CSI 1.5 solid device.  This was done at low medium and high speeds.  I then performed balloon angioplasty using a 5 x 40 balloon.  I then upsized to a 5 x 80 Lutonix balloon which was taken to 12 atm for 90 seconds.  Follow-up imaging showed his persistent filling defect at the area of the subtotal occlusion and so elected to stent this area using a 6 x 40 INNOVA which was postdilated with a 5 mm balloon.  Images were unchanged.  The stenosis was resolved.  A minx device was used to close the left groin which was unsuccessful and so manual pressure was held.  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 [name], M.D., [facility] Office: [phone] Pager:  [phone]

## 2019-08-06 NOTE — Interval H&P Note (Signed)
History and Physical Interval Note:  08/06/2019 11:53 AM  [name]  has presented today for surgery, with the diagnosis of PVD.  The various methods of treatment have been discussed with the patient and family. After consideration of risks, benefits and other options for treatment, the patient has consented to  Procedure(s): ABDOMINAL AORTOGRAM W/LOWER EXTREMITY (Bilateral) as a surgical intervention.  The patient's history has been reviewed, patient examined, no change in status, stable for surgery.  I have reviewed the patient's chart and labs.  Questions were answered to the patient's satisfaction.     [name]

## 2019-08-07 ENCOUNTER — Encounter (HOSPITAL_COMMUNITY): Payer: Self-pay | Admitting: Surgery

## 2019-08-07 ENCOUNTER — Encounter: Payer: Self-pay | Admitting: *Deleted

## 2019-08-13 ENCOUNTER — Other Ambulatory Visit: Payer: Self-pay | Admitting: Internal Medicine

## 2019-08-23 ENCOUNTER — Other Ambulatory Visit: Payer: Self-pay | Admitting: *Deleted

## 2019-08-23 DIAGNOSIS — I739 Peripheral vascular disease, unspecified: Secondary | ICD-10-CM

## 2019-09-09 DIAGNOSIS — L700 Acne vulgaris: Secondary | ICD-10-CM | POA: Diagnosis not present

## 2019-09-18 ENCOUNTER — Telehealth: Payer: Self-pay | Admitting: General Surgery

## 2019-09-18 NOTE — Telephone Encounter (Signed)
LVM for patient requesting he bring the SD card for cpap to appointment tomorrow, if machine is still being used. Nothing further needed at this time.

## 2019-09-19 ENCOUNTER — Other Ambulatory Visit: Payer: Self-pay

## 2019-09-19 ENCOUNTER — Ambulatory Visit (INDEPENDENT_AMBULATORY_CARE_PROVIDER_SITE_OTHER): Payer: PPO

## 2019-09-19 ENCOUNTER — Ambulatory Visit: Payer: PPO | Admitting: Pulmonary Disease

## 2019-09-19 ENCOUNTER — Encounter: Payer: Self-pay | Admitting: Pulmonary Disease

## 2019-09-19 VITALS — BP 118/60 | HR 45 | Temp 98.0°F | Ht 65.0 in | Wt 193.4 lb

## 2019-09-19 DIAGNOSIS — R0609 Other forms of dyspnea: Secondary | ICD-10-CM

## 2019-09-19 DIAGNOSIS — R0600 Dyspnea, unspecified: Secondary | ICD-10-CM | POA: Diagnosis not present

## 2019-09-19 DIAGNOSIS — R0602 Shortness of breath: Secondary | ICD-10-CM | POA: Diagnosis not present

## 2019-09-19 DIAGNOSIS — G4733 Obstructive sleep apnea (adult) (pediatric): Secondary | ICD-10-CM

## 2019-09-19 NOTE — Progress Notes (Signed)
[facility]  Chief Complaint  Patient presents with  . Complex sleep apnea syndrome    Getting short of breath in the last year.     Constitutional:  BP 118/60 (BP Location: Left Arm, Patient Position: Sitting, Cuff Size: Normal)   Pulse (!) 45   Temp 98 F (36.7 C)   Ht 5\' 5"  (1.651 m)   Wt 193 lb 6.4 oz (87.7 kg)   SpO2 98% Comment: on room air  BMI 32.18 kg/m   Past Medical History:  CAD, PVD, HLD, HTN, GERD, Secondary polycythemia, Vit D deficiency, Mobitz 1, Colon polyp  Brief Summary:  Douglas Christensen is a 68 y.o. male with complex sleep apnea.  I last saw him in 2015.    His Bipap machine is very old.  It isn't working anymore.  Has been getting supplies from [employer].  Can't sleep w/o Bipap.  He was tried on CPAP previous, but wasn't sufficient due to having both obstructive and central respiratory events.  Has noticed more trouble with his breathing with activity.  Has to stop and rest if he walks up stairs.  No having cough, wheeze, sputum, chest pain.  Heart rate is low today.  Ambulatory oximetry today - maintained SpO2, but his heart remained between 50 and 60.  He did report feeling dyspnea while walking.   Physical Exam:   Appearance - well kempt   ENMT - clear nasal mucosa, midline nasal  septum, no oral exudates, no LAN, trachea midline  Respiratory - normal chest wall, normal respiratory effort, no accessory muscle use, no wheeze/rales  CV - s1s2 regular and bradycardic, no murmurs, no peripheral edema, radial pulses symmetric  GI - soft, non tender, no masses  Lymph - no adenopathy noted in neck and axillary areas  MSK - normal gait  Ext - no cyanosis, clubbing, or joint inflammation noted  Skin - no rashes, lesions, or ulcers  Neuro - normal strength, oriented x 3  Psych - normal mood and affect   CMP Latest Ref Rng & Units 08/06/2019 07/30/2019 07/17/2019  Glucose 70 - 99 mg/dL 100(H) 96 124(H)  BUN 8 - 23  mg/dL 34(H) 38(H) 28(H)  Creatinine 0.61 - 1.24 mg/dL 1.30(H) 1.28(H) 1.49(H)  Sodium 135 - 145 mmol/L 137 142 136  Potassium 3.5 - 5.1 mmol/L 4.2 4.5 4.5  Chloride 98 - 111 mmol/L 106 105 101  CO2 20 - 29 mmol/L - 19(L) 24  Calcium 8.6 - 10.2 mg/dL - 9.6 9.6  Total Protein 6.5 - 8.1 g/dL - - -  Total Bilirubin 0.3 - 1.2 mg/dL - - -  Alkaline Phos 38 - 126 U/L - - -  AST 15 - 41 U/L - - -  ALT 0 - 44 U/L - - -    CBC Latest Ref Rng & Units 08/06/2019 07/17/2019 05/03/2019  WBC 4.0 - 10.5 K/uL - 9.4 8.5  Hemoglobin 13.0 - 17.0 g/dL 13.3 14.2 15.4  Hematocrit 39.0 - 52.0 % 39.0 41.0 44.0  Platelets 150 - 400 K/uL - 251 215      Assessment/Plan:   Complex sleep apnea. - he reports compliance with Bipap and benefit from therapy - he was tried on CPAP previous, but wasn't sufficient due to having both obstructive and central respiratory events - his current machine is more than 68 yrs old, broke and not amenable to repair - will arrange for new Bipap machine at 14/10 cm H2O - advised he  might need repeat sleep testing for insurance coverage prior to getting new machine  Dyspnea on exertion. - will arrange for chest xray and pulmonary function test  Bradycardia. - advised him to contact his cardiologist to assess bradycardia further (followed by Dr. Sherren Mocha)   Patient Instructions  Chest xray today Will schedule pulmonary function test Will arrange for new Bipap machine  Follow up in 6 months    Chesley Mires, MD [place] Pager: [phone] 09/19/2019, 11:43 AM  Flow Sheet     Pulmonary tests:  PFT 02/28/13 >> FEV1 1.91 (75%), FEV1% 75, TLC 4.71 (95%), DLCO 82%, no BD  Sleep tests:  HST 11/23/12 >> AHI 15.8, SpO2 low 83%, both obstructive and central events BiPAP 02/21/14 >> BiPAP 17/13 cm H2O >> AHI 0, + R. Centrals with lower pressures. BiPAP 09/20/14 to 10/19/14 >> used on 30 of 30 nights with average 6 hrs 9 min.  Average AHI 3.3 with  BiPAP 14/10 cm H2O  Cardiac tests:  Echo 01/22/18 >> EF 60 to 65%, mild LVH, grade 2 DD  Medications:   Allergies as of 09/19/2019      Reactions   Roxicodone [oxycodone Hcl] Other (See Comments)   hallucinations   Benazepril Cough   Gabapentin Rash   Itraconazole Nausea Only, Rash   Lipitor [atorvastatin Calcium]    cramps   Plavix [clopidogrel Bisulfate] Rash      Medication List       Accurate as of September 19, 2019 11:43 AM. If you have any questions, ask your nurse or doctor.        amLODipine 10 MG tablet Commonly known as: NORVASC Take 1 tablet (10 mg total) by mouth daily. What changed: when to take this   aspirin EC 81 MG tablet Take 1 tablet (81 mg total) by mouth daily.   cholecalciferol 1000 units tablet Commonly known as: VITAMIN D Take 1,000 Units by mouth daily.   clindamycin 1 % gel Commonly known as: CLINDAGEL APPLY TO face TWICE DAILY AS NEEDED   clonazePAM 0.5 MG tablet Commonly known as: KLONOPIN Take 0.5 mg by mouth 2 (two) times daily as needed for anxiety. What changed: Another medication with the same name was removed. Continue taking this medication, and follow the directions you see here. Changed by: Chesley Mires, MD   Garlic 784 MG Caps Take 500 mg by mouth daily.   hydrALAZINE 50 MG tablet Commonly known as: APRESOLINE TAKE 1 tablet BY MOUTH THREE TIMES DAILY What changed:   how much to take  how to take this  when to take this  additional instructions   losartan 25 MG tablet Commonly known as: COZAAR Take 1 tablet (25 mg total) by mouth daily. Take 50 mg in the morning and 25 mg PM What changed:   how much to take  when to take this   Potassium Chloride CR 8 MEQ Cpcr capsule CR Commonly known as: MICRO-K Take 2 capsules (16 mEq total) by mouth 2 (two) times daily. What changed: how much to take   rosuvastatin 40 MG tablet Commonly known as: CRESTOR Take 40 mg by mouth at bedtime.   tadalafil (PAH) 20 MG  tablet Commonly known as: ADCIRCA Take 40 mg by mouth daily. What changed: Another medication with the same name was removed. Continue taking this medication, and follow the directions you see here. Changed by: Chesley Mires, MD   ticagrelor 60 MG Tabs tablet Commonly known as: Brilinta Take 1.5 tablets (90  mg total) by mouth 2 (two) times daily.   traMADol 50 MG tablet Commonly known as: ULTRAM Take by mouth every 6 (six) hours as needed. Only as needed What changed: Another medication with the same name was removed. Continue taking this medication, and follow the directions you see here. Changed by: Chesley Mires, MD   triamterene-hydrochlorothiazide 37.5-25 MG tablet Commonly known as: MAXZIDE-25 TAKE TWO TABLETS BY MOUTH EVERY DAY   vitamin B-12 1000 MCG tablet Commonly known as: CYANOCOBALAMIN Take 1,000 mcg by mouth daily.       Past Surgical History:  He  has a past surgical history that includes lower extremity stents; Femoral-popliteal Bypass Graft (12/27/2012); Cardiac catheterization (05/02/13); Coronary artery bypass graft (N/A, 05/06/2013); lower extremity angiogram (Bilateral, 11/16/2011); abdominal aortagram (N/A, 11/16/2011); percutaneous stent intervention (Left, 11/16/2011); abdominal aortagram (N/A, 11/14/2012); abdominal aortagram (N/A, 12/30/2014); Endarterectomy femoral (Left, 01/29/2015); Iliac atherectomy (Left, 01/29/2015); Tonsillectomy; Total hip arthroplasty (Left, 06/29/2015); Eye surgery (03/24/16); ABDOMINAL AORTOGRAM W/LOWER EXTREMITY (Bilateral, 08/06/2019); PERIPHERAL VASCULAR ATHERECTOMY (Right, 08/06/2019); and PERIPHERAL VASCULAR INTERVENTION (Right, 08/06/2019).  Family History:  His family history includes Alzheimer's disease in his father; Cancer in his father and mother; Heart disease in his mother; Hyperlipidemia in his mother; Hypertension in his mother.  Social History:  He  reports that he quit smoking about 6 years ago. His smoking use included cigarettes. He  has a 94.00 pack-year smoking history. He has never used smokeless tobacco. He reports current alcohol use of about 4.0 standard drinks of alcohol per week. He reports that he does not use drugs.

## 2019-09-19 NOTE — Patient Instructions (Signed)
Chest xray today Will schedule pulmonary function test Will arrange for new Bipap machine  Follow up in 6 months

## 2019-09-24 ENCOUNTER — Telehealth: Payer: Self-pay | Admitting: Pulmonary Disease

## 2019-09-24 NOTE — Telephone Encounter (Signed)
CLINICAL DATA:  Shortness of breath with exertion.  EXAM: CHEST - 2 VIEW  COMPARISON:  Chest radiograph 12/07/2017  FINDINGS: Stable cardiomegaly status post median sternotomy. Tortuosity of the thoracic aorta. No large area of pulmonary consolidation. No pleural effusion or pneumothorax. Thoracic spine degenerative changes.  IMPRESSION: Cardiomegaly.  No acute cardiopulmonary process.   Electronically Signed   By: Lovey Newcomer M.D.   On: 09/19/2019 16:52    Please let him know his chest xray shows expected changes from history of heart surgery.  No change to current treatment plan.

## 2019-09-26 NOTE — Telephone Encounter (Signed)
Patient is returning phone call.  Patient phone number is [phone].  Patient checking status of new CPAP machine.

## 2019-09-26 NOTE — Telephone Encounter (Signed)
LVMTCB x 1 for patient. 

## 2019-09-27 NOTE — Telephone Encounter (Signed)
Call made to patient, confirmed DOB, made aware of CXR results as documented by VS. Voiced understanding. Pt inquired about the status of his bipap machine. I made him aware the order was sent 10/22 and he can call [employer] for a status check. Voiced understanding. Nothing further needed at this time.

## 2019-10-01 DIAGNOSIS — G4733 Obstructive sleep apnea (adult) (pediatric): Secondary | ICD-10-CM | POA: Diagnosis not present

## 2019-10-04 ENCOUNTER — Telehealth (HOSPITAL_COMMUNITY): Payer: Self-pay | Admitting: *Deleted

## 2019-10-04 NOTE — Telephone Encounter (Signed)

## 2019-10-07 ENCOUNTER — Ambulatory Visit (INDEPENDENT_AMBULATORY_CARE_PROVIDER_SITE_OTHER)
Admission: RE | Admit: 2019-10-07 | Discharge: 2019-10-07 | Disposition: A | Discharge status: Home / Self Care | Payer: PPO | Source: Ambulatory Visit | Attending: Family | Admitting: Family

## 2019-10-07 ENCOUNTER — Ambulatory Visit (INDEPENDENT_AMBULATORY_CARE_PROVIDER_SITE_OTHER): Payer: PPO | Admitting: Family

## 2019-10-07 ENCOUNTER — Other Ambulatory Visit: Payer: Self-pay

## 2019-10-07 ENCOUNTER — Encounter: Payer: Self-pay | Admitting: Family

## 2019-10-07 ENCOUNTER — Ambulatory Visit (HOSPITAL_COMMUNITY)
Admission: RE | Admit: 2019-10-07 | Discharge: 2019-10-07 | Disposition: A | Discharge status: Home / Self Care | Payer: PPO | Source: Ambulatory Visit | Attending: Family | Admitting: Family

## 2019-10-07 VITALS — BP 154/55 | HR 56 | Temp 97.7°F | Resp 18 | Ht 65.0 in | Wt 195.0 lb

## 2019-10-07 DIAGNOSIS — I779 Disorder of arteries and arterioles, unspecified: Secondary | ICD-10-CM

## 2019-10-07 DIAGNOSIS — I739 Peripheral vascular disease, unspecified: Secondary | ICD-10-CM

## 2019-10-07 DIAGNOSIS — Z87891 Personal history of nicotine dependence: Secondary | ICD-10-CM

## 2019-10-07 DIAGNOSIS — Z95828 Presence of other vascular implants and grafts: Secondary | ICD-10-CM

## 2019-10-07 NOTE — Progress Notes (Signed)
[facility] [place]   CC: Follow up peripheral artery occlusive disease  History of Present Illness Douglas Christensen is a 68 y.o. male who is s/p stent placement right common iliac artery (12 x 40), stent placement right external iliac arteries, atherectomy of right popliteal artery, and stent placement of right popliteal artery on 08-06-19 by Dr. Trula Slade for right leg claudication.   He is also s/p femoral endarterectomy with vein patch angioplasty, followed by atherectomy and stenting of his left superficial femoral artery on 01-29-15 by Dr. Trula Slade. This was done for claudication. Overlapping 7 mm Cordis stents were placed. The patient did well postoperatively and was discharged to home.He has a history of right femoral to above-knee popliteal artery bypass graft with vein which was performed on 12/10/2012.   The patient was at the beach early Summer of 2017and developed bilateral leg swelling. There was no inciting factor. He has been wearing compression stockings. He has no ulcers. His swelling is better with elevation. He underwent a CT scan while at the beach which showed patent right femoral-popliteal bypass graft and patent left femoral endarterectomy with stenting of the left superficial femoral artery. There was no evidence of DVT.  He had a CABG x 5 vessels in June 2014.  He denies any known hx of stroke or TIA.   He denies claudication type sx's with walking except in both hips after walking 200 yards, relieved by rest. He states that he does not notice any improvement in his legs with walking since the September 2020 intervention.   He states that he had some back imaging in early 2020, no back problem found. He does not know if he has arthritis in his hips.   Diabetic: No Tobacco use: former smoker, quit in 2008, smoked x 45 years  Pt meds include: Statin :Yes Betablocker: no ASA: Yes Other anticoagulants/antiplatelets: Brilinta, states he  had a bloody nose, "took a while to stop". He is taking 90 mg bid, states he was advised to take 1.5 tabs bid, 60 mg tabs   Past Medical History:  Diagnosis Date  . Anxiety   . Arthritis   . CAD (coronary artery disease)    Mild plaque (cath "years ago"); abnormal Myoview 04/2013 with subsequent CABG x 5 with LIMA to LAD, SVG to OM1, SVG to DX, SVG to PD & PL.   . Carotid artery disease ([place])    Carotid US 07/2019 bilateral ICA 40-59; bilateral subclavian stenosis  . Cataract   . GERD (gastroesophageal reflux disease)   . History of colonic polyps   . History of echocardiogram    Echo 2/19: Mild LVH, EF 60-65, normal wall motion, grade 2 diastolic dysfunction, mild RAE  . History of nuclear stress test    Myoview 2/19: inf/inf-lat/apical inf/ant-sept defect (?diaph atten - cannot rule out peri-infarct ischemia), PVCs/PACs/Mobitz 1 // Myoview 05/2019: EF 60, inf infarct with mild peri-infarct ischemia; no significant change when compared to prior study; Intermediate Risk  . Hx of echocardiogram    Echo (9/15):  EF 55-60%; Gr 2 DD, mild BAE  . Hyperlipidemia   . Hypertension   . LBP (low back pain)   . Meralgia paresthetica of left side 2011  . PVD (peripheral vascular disease) ([place])    Stent to left common femoral and right superficial femoral.  2008.  50%  left renal   . Second degree AV block, Mobitz type I    Holter 2/19: Sinus rhythm, average heart rate  72, frequent PVCs (burden 5%), second-degree type I AV block and periods of 2:1 heart block >> continue clinical managment and avoid AVN blocking agents  . Shortness of breath    "once in awhile; can happen at anytime" (08/26/2013)  . Sleep apnea    mod OSA, central sleep apnea/hypoapnea syndrome 11/22/12, CPAP every night   . Subclavian artery stenosis ([place])    Carotid US 07/2019 bilateral ICA 40-59; bilateral subclavian stenosis  . Vitamin D deficiency     Social History Social History   Tobacco Use  . Smoking status: Former  Smoker    Packs/day: 2.00    Years: 47.00    Pack years: 94.00    Types: Cigarettes    Quit date: 12/17/2012    Years since quitting: 6.8  . Smokeless tobacco: Never Used  Substance Use Topics  . Alcohol use: Yes    Alcohol/week: 4.0 standard drinks    Types: 4 Shots of liquor per week    Comment: 08/26/2013 "3-4 mixed drinks/wk"  . Drug use: No    Family History Family History  Problem Relation Age of Onset  . Heart disease Mother        No clear CAD and Heart Disease before age 38  . Hypertension Mother   . Cancer Mother        ? colon ca  . Hyperlipidemia Mother   . Cancer Father        brain tumor  . Alzheimer's disease Father   . Colon cancer Neg Hx   . Heart attack Neg Hx   . Esophageal cancer Neg Hx   . Liver cancer Neg Hx   . Rectal cancer Neg Hx   . Stomach cancer Neg Hx   . Pancreatic cancer Neg Hx     Past Surgical History:  Procedure Laterality Date  . ABDOMINAL AORTAGRAM N/A 11/16/2011   Procedure: ABDOMINAL Maxcine Ham;  Surgeon: Sherren Mocha, MD;  Location: [facility] CATH LAB;  Service: Cardiovascular;  Laterality: N/A;  . ABDOMINAL AORTAGRAM N/A 11/14/2012   Procedure: ABDOMINAL Maxcine Ham;  Surgeon: Sherren Mocha, MD;  Location: [facility] CATH LAB;  Service: Cardiovascular;  Laterality: N/A;  . ABDOMINAL AORTAGRAM N/A 12/30/2014   Procedure: ABDOMINAL Maxcine Ham;  Surgeon: Serafina Mitchell, MD;  Location: [facility] CATH LAB;  Service: Cardiovascular;  Laterality: N/A;  . ABDOMINAL AORTOGRAM W/LOWER EXTREMITY Bilateral 08/06/2019   Procedure: ABDOMINAL AORTOGRAM W/LOWER EXTREMITY;  Surgeon: Serafina Mitchell, MD;  Location: [place] CV LAB;  Service: Cardiovascular;  Laterality: Bilateral;  . CARDIAC CATHETERIZATION  05/02/13   x2   . CORONARY ARTERY BYPASS GRAFT N/A 05/06/2013   Procedure: CORONARY ARTERY BYPASS GRAFTING (CABG);  Surgeon: Melrose Nakayama, MD;  Location: [facility];  Service: Open Heart Surgery;  Laterality: N/A;  Coronary artery bypass graft times five using left  internal mammary artery and left greater saphenous vein via endovein harvest.  . ENDARTERECTOMY FEMORAL Left 01/29/2015   Procedure: ENDARTERECTOMY FEMORAL WITH PATCH ANGIOPLASTY;  Surgeon: Serafina Mitchell, MD;  Location: [place];  Service: Vascular;  Laterality: Left;  . EYE SURGERY  03/24/16   cataract surgery on left eye  . FEMORAL-POPLITEAL BYPASS GRAFT  12/27/2012   Procedure: BYPASS GRAFT FEMORAL-POPLITEAL ARTERY;  Surgeon: Serafina Mitchell, MD;  Location: [facility] OR;  Service: Vascular;  Laterality: Right;  using non-reversed sapphenous vein.  Marland Kitchen ILIAC ATHERECTOMY Left 01/29/2015   Procedure: SUPERFICIAL FEMORAL ARTERY ATHERECTOMY/PERCUTANEOUS TRANSLUMINAL ANGIOPLASTY; superficial femoral artery stent;  Surgeon: Serafina Mitchell, MD;  Location: [facility] OR;  Service: Vascular;  Laterality: Left;  . LOWER EXTREMITY ANGIOGRAM Bilateral 11/16/2011   Procedure: LOWER EXTREMITY ANGIOGRAM;  Surgeon: Sherren Mocha, MD;  Location: [facility] CATH LAB;  Service: Cardiovascular;  Laterality: Bilateral;  . lower extremity stents     bilateral lower extremities x 2  . PERCUTANEOUS STENT INTERVENTION Left 11/16/2011   Procedure: PERCUTANEOUS STENT INTERVENTION;  Surgeon: Sherren Mocha, MD;  Location: [facility] CATH LAB;  Service: Cardiovascular;  Laterality: Left;  . PERIPHERAL VASCULAR ATHERECTOMY Right 08/06/2019   Procedure: PERIPHERAL VASCULAR ATHERECTOMY;  Surgeon: Serafina Mitchell, MD;  Location: [place] CV LAB;  Service: Cardiovascular;  Laterality: Right;  Common iliac, external iliac, and popliteal.  . PERIPHERAL VASCULAR INTERVENTION Right 08/06/2019   Procedure: PERIPHERAL VASCULAR INTERVENTION;  Surgeon: Serafina Mitchell, MD;  Location: [place] CV LAB;  Service: Cardiovascular;  Laterality: Right;  common iliac, external iliac, and popliteal.  . TONSILLECTOMY    . TOTAL HIP ARTHROPLASTY Left 06/29/2015   Procedure: TOTAL HIP ARTHROPLASTY;  Surgeon: Frederik Pear, MD;  Location: [place];  Service: Orthopedics;  Laterality:  Left;  LEFT TOTAL HIP ARTHROPLASTY DEPUY SROM/PINNACLE    Allergies  Allergen Reactions  . Roxicodone [Oxycodone Hcl] Other (See Comments)    hallucinations  . Benazepril Cough  . Gabapentin Rash  . Itraconazole Nausea Only and Rash  . Lipitor [Atorvastatin Calcium]     cramps  . Plavix [Clopidogrel Bisulfate] Rash    Current Outpatient Medications  Medication Sig Dispense Refill  . aspirin EC 81 MG tablet Take 1 tablet (81 mg total) by mouth daily. 90 tablet 3  . cholecalciferol (VITAMIN D) 1000 units tablet Take 1,000 Units by mouth daily.     . clindamycin (CLINDAGEL) 1 % gel APPLY TO face TWICE DAILY AS NEEDED    . clonazePAM (KLONOPIN) 0.5 MG tablet Take 0.5 mg by mouth 2 (two) times daily as needed for anxiety.    . Garlic 332 MG CAPS Take 500 mg by mouth daily.    . hydrALAZINE (APRESOLINE) 50 MG tablet TAKE 1 tablet BY MOUTH THREE TIMES DAILY (Patient taking differently: Take 50 mg by mouth 3 (three) times daily. ) 270 tablet 3  . losartan (COZAAR) 25 MG tablet Take 1 tablet (25 mg total) by mouth daily. Take 50 mg in the morning and 25 mg PM (Patient taking differently: Take 25-50 mg by mouth See admin instructions. Take 50 mg in the morning and 25 mg PM) 180 tablet 3  . Potassium Chloride CR (MICRO-K) 8 MEQ CPCR capsule CR Take 2 capsules (16 mEq total) by mouth 2 (two) times daily. (Patient taking differently: Take 8 mEq by mouth 2 (two) times daily. ) 120 capsule 11  . rosuvastatin (CRESTOR) 40 MG tablet Take 40 mg by mouth at bedtime.     . tadalafil, PAH, (ADCIRCA) 20 MG tablet Take 40 mg by mouth daily.    . ticagrelor (BRILINTA) 60 MG TABS tablet Take 1.5 tablets (90 mg total) by mouth 2 (two) times daily. 60 tablet 6  . traMADol (ULTRAM) 50 MG tablet Take by mouth every 6 (six) hours as needed. Only as needed    . triamterene-hydrochlorothiazide (MAXZIDE-25) 37.5-25 MG tablet TAKE TWO TABLETS BY MOUTH EVERY DAY (Patient taking differently: Take 2 tablets by mouth daily.  ) 60 tablet 11  . vitamin B-12 (CYANOCOBALAMIN) 1000 MCG tablet Take 1,000 mcg by mouth daily.    Marland Kitchen amLODipine (NORVASC) 10 MG tablet Take 1 tablet (10 mg  total) by mouth daily. (Patient taking differently: Take 10 mg by mouth every evening. ) 90 tablet 3   No current facility-administered medications for this visit.     ROS: See HPI for pertinent positives and negatives.   Physical Examination  Vitals:   10/07/19 1003  BP: (!) 154/55  Pulse: (!) 56  Resp: 18  Temp: 97.7 F (36.5 C)  TempSrc: Temporal  SpO2: 98%  Weight: 195 lb (88.5 kg)  Height: 5\' 5"  (1.651 m)   Body mass index is 32.45 kg/m.  General: A&O x 3, WDWN, obese male in NAD. Gait: normal HEENT: No gross abnormalities.  Pulmonary: Respirations are non labored, good air movement in all fields, no rales, rhonchi, or wheezes Cardiac: regular rhythm, no detected murmur.      Radial pulses are 2+ palpable bilaterally   Adominal aortic pulse is not palpable                         VASCULAR EXAM: Extremities without ischemic changes, without Gangrene; without open wounds.                                                                                                          LE Pulses Right Left       FEMORAL  4+ palpable  4+ palpable        POPLITEAL  not palpable   not palpable       POSTERIOR TIBIAL  not palpable   not palpable        DORSALIS PEDIS      ANTERIOR TIBIAL 3+ palpable  2+ palpable    Abdomen: soft, NT, no palpable masses. Skin: no rashes, no cellulitis, no ulcers noted. Musculoskeletal: no muscle wasting or atrophy.  Neurologic: A&O X 3; appropriate affect, Sensation is normal; MOOR FUNCTION:  moving all extremities equally, motor strength 5/5 throughout. Speech is fluent/normal. CN 2-12 intact. Psychiatric: Thought content is normal, mood appropriate for clinical situation.                                                                                                                                              DATA  Abdominal Aorta Findings (10-07-19): +-------------+-------+----------+----------+----------+--------+--------+ Location     AP (cm)Trans (cm)PSV (cm/s)Waveform  ThrombusComments +-------------+-------+----------+----------+----------+--------+--------+ Proximal  91                                   +-------------+-------+----------+----------+----------+--------+--------+ Mid                           96                                   +-------------+-------+----------+----------+----------+--------+--------+ Distal                        114                                  +-------------+-------+----------+----------+----------+--------+--------+ RT CIA Distal                 82                                   +-------------+-------+----------+----------+----------+--------+--------+ RT EIA Prox                   132                                  +-------------+-------+----------+----------+----------+--------+--------+ RT EIA Mid                    300       triphasic                  +-------------+-------+----------+----------+----------+--------+--------+ RT EIA Distal                 379       biphasic                   +-------------+-------+----------+----------+----------+--------+--------+ LT CIA Distal                 267       biphasic                   +-------------+-------+----------+----------+----------+--------+--------+ LT EIA Prox                   400       biphasic                   +-------------+-------+----------+----------+----------+--------+--------+ LT EIA Mid                    465       biphasic                   +-------------+-------+----------+----------+----------+--------+--------+ LT EIA Distal                 76        monophasic                  +-------------+-------+----------+----------+----------+--------+--------+  Visualization of the Right CIA Proximal artery, Right CIA Mid artery, Left CIA Proximal artery and Left CIA Mid artery was limited. Summary: Stenosis: +--------------------+-------------+---------------+ Location            Stenosis     Stent           +--------------------+-------------+---------------+  Right External Iliac             50-99% stenosis +--------------------+-------------+---------------+ Left External Iliac >50% stenosis                +--------------------+-------------+---------------+  Unable to visualized proximal and mid bilateral common iliac arteries.  Ectatic distal left EIA measuring up to 1.97 cm in AP diameter.  Stenosis percentage is by velocity criteria only. Unable to determine absence or presence of narrowing and/or plaque due to limitations as listed above.  Right common iliac artery and external iliac artery stent walls are not well visualized, however visualized areas appear patent.  Technically difficult exam   ABI (Date: 10/07/2019):   ABI Findings: +---------+------------------+-----+---------+--------+ Right    Rt Pressure (mmHg)IndexWaveform Comment  +---------+------------------+-----+---------+--------+ Brachial 129                    triphasic         +---------+------------------+-----+---------+--------+ PTA      138               0.87 triphasic         +---------+------------------+-----+---------+--------+ DP       174               1.10 triphasic         +---------+------------------+-----+---------+--------+ Great Toe105               0.66 Abnormal          +---------+------------------+-----+---------+--------+  +---------+------------------+-----+---------+-------+ Left     Lt Pressure (mmHg)IndexWaveform Comment +---------+------------------+-----+---------+-------+ Brachial 158                     triphasic        +---------+------------------+-----+---------+-------+ PTA      125               0.79 triphasic        +---------+------------------+-----+---------+-------+ DP       133               0.84 biphasic         +---------+------------------+-----+---------+-------+ Great Toe131               0.83 Normal           +---------+------------------+-----+---------+-------+  +-------+-----------+-----------+------------+------------+ ABI/TBIToday's ABIToday's TBIPrevious ABIPrevious TBI +-------+-----------+-----------+------------+------------+ Right  1.10       0.66       0.34        0.22         +-------+-----------+-----------+------------+------------+ Left   0.83       0.84       0.73        0.70         +-------+-----------+-----------+------------+------------+  Bilateral ABIs appear increased compared to prior study on 07/25/2019. Bilateral TBIs appear essentially unchanged compared to prior study on 07/25/2019.   Summary: Right: Resting right ankle-brachial index is within normal range. No evidence of significant right lower extremity arterial disease. The right toe-brachial index is abnormal. RT great toe pressure = 105 mmHg.  Left: Resting left ankle-brachial index indicates mild left lower extremity arterial disease. The left toe-brachial index is normal. LT Great toe pressure = 131 mmHg.    ASSESSMENT: Douglas Christensen is a 68 y.o. male who is s/p stent placement right common iliac artery (12 x 40), stent placement right external iliac arteries, atherectomy of right popliteal artery, and stent placement of right popliteal artery on 08-06-19 by Dr. Trula Slade for right leg  claudication.   He is also s/p femoral endarterectomy with vein patch angioplasty, followed by atherectomy and stenting of his left superficial femoral artery on 01-29-15 by Dr. Trula Slade. This was done for claudication. Overlapping 7 mm Cordis stents were  placed. The patient did well postoperatively and was discharged to home.He has a history of right femoral to above-knee popliteal artery bypass graft with vein which was performed on 12/10/2012.   Elevated velocities in the bilateral EIA, with normal right ABI (improved from 0.34), and improved left ABI to 0.83 from 0.73. Pt c/o bilateral hip pain after walking 200 yards, relived by rest, bilateral femoral pulses are 4+ palpable, he has a history of left hip arthroplasty a few years ago.  I discussed with Dr. Trula Slade that pt is taking 90 mg Brilinta bid, which is the dose that Dr. Trula Slade advises.  I discussed the above with Dr. Trula Slade who viewed today's imaging and other pt records, see Plan.   PLAN:  Pt will return to clinic in 6 months with bilateral aortoiliac duplex and ABI's.  I advised pt to notify us if he develops concerns re the circulation in is feet or legs.  I advised pt to walk at least 30 minutes daily total in a safe environment.   I discussed in depth with the patient the nature of atherosclerosis, and emphasized the importance of maximal medical management including strict control of blood pressure, blood glucose, and lipid levels, obtaining regular exercise, and continued cessation of smoking.  The patient is aware that without maximal medical management the underlying atherosclerotic disease process will progress, limiting the benefit of any interventions.  The patient was given information about PAD including signs, symptoms, treatment, what symptoms should prompt the patient to seek immediate medical care, and risk reduction measures to take.  Clemon Chambers, RN, MSN, FNP-C [facility] Phone: [phone]  Clinic MD: Trula Slade  10/07/19 10:16 AM

## 2019-10-07 NOTE — Patient Instructions (Signed)
Peripheral Vascular Disease  Peripheral vascular disease (PVD) is a disease of the blood vessels that are not part of your heart and brain. A simple term for PVD is poor circulation. In most cases, PVD narrows the blood vessels that carry blood from your heart to the rest of your body. This can reduce the supply of blood to your arms, legs, and internal organs, like your stomach or kidneys. However, PVD most often affects a person's lower legs and feet. Without treatment, PVD tends to get worse. PVD can also lead to acute ischemic limb. This is when an arm or leg suddenly cannot get enough blood. This is a medical emergency. Follow these instructions at home: Lifestyle  Do not use any products that contain nicotine or tobacco, such as cigarettes and e-cigarettes. If you need help quitting, ask your doctor.  Lose weight if you are overweight. Or, stay at a healthy weight as told by your doctor.  Eat a diet that is low in fat and cholesterol. If you need help, ask your doctor.  Exercise regularly. Ask your doctor for activities that are right for you. General instructions  Take over-the-counter and prescription medicines only as told by your doctor.  Take good care of your feet: ? Wear comfortable shoes that fit well. ? Check your feet often for any cuts or sores.  Keep all follow-up visits as told by your doctor This is important. Contact a doctor if:  You have cramps in your legs when you walk.  You have leg pain when you are at rest.  You have coldness in a leg or foot.  Your skin changes.  You are unable to get or have an erection (erectile dysfunction).  You have cuts or sores on your feet that do not heal. Get help right away if:  Your arm or leg turns cold, numb, and blue.  Your arms or legs become red, warm, swollen, painful, or numb.  You have chest pain.  You have trouble breathing.  You suddenly have weakness in your face, arm, or leg.  You become very  confused or you cannot speak.  You suddenly have a very bad headache.  You suddenly cannot see. Summary  Peripheral vascular disease (PVD) is a disease of the blood vessels.  A simple term for PVD is poor circulation. Without treatment, PVD tends to get worse.  Treatment may include exercise, low fat and low cholesterol diet, and quitting smoking. This information is not intended to replace advice given to you by your health care provider. Make sure you discuss any questions you have with your health care provider. Document Released: 02/08/2010 Document Revised: 10/27/2017 Document Reviewed: 12/22/2016 Elsevier Patient Education  2020 [employer]  

## 2019-10-09 ENCOUNTER — Telehealth: Payer: Self-pay | Admitting: Pulmonary Disease

## 2019-10-09 ENCOUNTER — Other Ambulatory Visit: Payer: Self-pay | Admitting: *Deleted

## 2019-10-09 DIAGNOSIS — I779 Disorder of arteries and arterioles, unspecified: Secondary | ICD-10-CM

## 2019-10-09 MED ORDER — TICAGRELOR 60 MG PO TABS: 90.0000 mg | ORAL_TABLET | Freq: Two times a day (BID) | ORAL | 2 refills | Status: DC

## 2019-10-09 NOTE — Telephone Encounter (Signed)
Called and spoke with pt. Pt said that he still has been having problems with his breathing since last OV and states that he believes it has gotten worse. Pt said he was told by [facility] that he needed to have a PFT performed and I told pt the first avail for a full PFT is not until 1/8.  Pt wants to know from [facility] what could be recommended to help. Pt is not due to come back to office for OV until 02/2020.  Dr. Halford Chessman, please advise for pt. Thanks!

## 2019-10-10 ENCOUNTER — Ambulatory Visit: Payer: PPO | Admitting: Adult Health

## 2019-10-10 ENCOUNTER — Encounter: Payer: Self-pay | Admitting: Adult Health

## 2019-10-10 ENCOUNTER — Other Ambulatory Visit: Payer: Self-pay

## 2019-10-10 DIAGNOSIS — G4731 Primary central sleep apnea: Secondary | ICD-10-CM

## 2019-10-10 DIAGNOSIS — R0609 Other forms of dyspnea: Secondary | ICD-10-CM

## 2019-10-10 DIAGNOSIS — R0600 Dyspnea, unspecified: Secondary | ICD-10-CM | POA: Diagnosis not present

## 2019-10-10 MED ORDER — SPIRIVA RESPIMAT 2.5 MCG/ACT IN AERS: 2.0000 | INHALATION_SPRAY | Freq: Every day | RESPIRATORY_TRACT | 3 refills | Status: DC

## 2019-10-10 MED ORDER — SPIRIVA RESPIMAT 2.5 MCG/ACT IN AERS: 2.0000 | INHALATION_SPRAY | Freq: Every day | RESPIRATORY_TRACT | 0 refills | Status: DC

## 2019-10-10 NOTE — Progress Notes (Signed)
Reviewed and agree with assessment/plan.   Monterrio Gerst, MD [facility] 11/23/2016, 12:24 PM Pager:  [phone]  

## 2019-10-10 NOTE — Addendum Note (Signed)
Addended by: Lia Foyer R on: 10/10/2019 05:20 PM   Modules accepted: Orders

## 2019-10-10 NOTE — Progress Notes (Signed)
@Patient  ID: [name], male    DOB: [birth year], 68 y.o.   MRN: [id number]  Chief Complaint  Patient presents with   Follow-up    Dyspnea     Referring provider: Plotnikov, Evie Lacks, MD  HPI: 68 year old male former smoker (2014) followed for complex sleep apnea. Medical history significant for polycythemia , CAD, PAD   TEST/EVENTS :  PFT 02/28/13 >> FEV1 1.91 (75%), FEV1% 75, TLC 4.71 (95%), DLCO 82%, no BD  Sleep tests:  HST 11/23/12>>AHI 15.8, SpO2 low 83%, both obstructive and central events BiPAP 02/21/14>>BiPAP 17/13 cm H2O >>AHI 0, + R. Centrals with lower pressures. BiPAP 09/20/14 to 10/19/14>>used on 30 of 30 nights with average 6 hrs 9 min. Average AHI 3.3 with BiPAP 14/10 cm H2O  Cardiac tests:  Echo 01/22/18 >> EF 60 to 65%, mild LVH, grade 2 DD  Stress Myoview July 2020 EF 60%, no ST changes during stress portion.  Medium sized defect noted was recommended continue on current medications.  10/10/2019 Follow up : Dyspnea  Patient returns for a 3-week follow-up.  Patient was seen last visit for a follow-up to sleep apnea.  He has recently gotten a new BiPAP machine.  Says it is working well. Really likes the new machine .   Last visit patient was complaining of progressively worsening shortness of breath.  He was set up for a chest x-ray that showed no acute findings.  He was set up for pulmonary function testing that is pending.  Has a heavy smoking history with previous PFT in 2014 that were normal .  Today O2 saturations 98% on room air.  Weight has been stable without significant gain or loss.  No fever chest pain orthopnea PND or increased leg swelling. Walk test in office last month was normal with no desaturations on room air.   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  Somewhat Sedentary  Lifestyle.  Mows grass -riding lawnmower.   Gets sob with minimal activity .Getting worse for last year. Activity tolerance is declining.  Patient denies  any significant cough.  No wheezing.  Appetite is good.  No nausea vomiting diarrhea.     Allergies  Allergen Reactions   Roxicodone [Oxycodone Hcl] Other (See Comments)    hallucinations   Benazepril Cough   Gabapentin Rash   Itraconazole Nausea Only and Rash   Lipitor [Atorvastatin Calcium]     cramps   Plavix [Clopidogrel Bisulfate] Rash    Immunization History  Administered Date(s) Administered   Influenza Inj Mdck Quad With Preservative 10/31/2018, 07/16/2019   Influenza Split 12/28/2012   Influenza Whole 08/18/2010   Influenza, High Dose Seasonal PF 09/05/2016, 09/06/2017   Influenza,inj,Quad PF,6+ Mos 07/22/2015   Influenza-Unspecified 01/16/2014   Pneumococcal Conjugate-13 01/27/2014   Pneumococcal Polysaccharide-23 08/28/2006   Tdap 03/01/2017    Past Medical History:  Diagnosis Date   Anxiety    Arthritis    CAD (coronary artery disease)    Mild plaque (cath "years ago"); abnormal Myoview 04/2013 with subsequent CABG x 5 with LIMA to LAD, SVG to OM1, SVG to DX, SVG to PD & PL.    Carotid artery disease ([place])    Carotid US 07/2019 bilateral ICA 40-59; bilateral subclavian stenosis   Cataract    GERD (gastroesophageal reflux disease)    History of colonic polyps    History of echocardiogram    Echo 2/19: Mild LVH, EF 60-65, normal wall motion, grade 2 diastolic dysfunction, mild RAE   History  of nuclear stress test    Myoview 2/19: inf/inf-lat/apical inf/ant-sept defect (?diaph atten - cannot rule out peri-infarct ischemia), PVCs/PACs/Mobitz 1 // Myoview 05/2019: EF 60, inf infarct with mild peri-infarct ischemia; no significant change when compared to prior study; Intermediate Risk   Hx of echocardiogram    Echo (9/15):  EF 55-60%; Gr 2 DD, mild BAE   Hyperlipidemia    Hypertension    LBP (low back pain)    Meralgia paresthetica of left side 2011   PVD (peripheral vascular disease) ([place])    Stent to left common femoral and right  superficial femoral.  2008.  50%  left renal    Second degree AV block, Mobitz type I    Holter 2/19: Sinus rhythm, average heart rate 72, frequent PVCs (burden 5%), second-degree type I AV block and periods of 2:1 heart block >> continue clinical managment and avoid AVN blocking agents   Shortness of breath    "once in awhile; can happen at anytime" (08/26/2013)   Sleep apnea    mod OSA, central sleep apnea/hypoapnea syndrome 11/22/12, CPAP every night    Subclavian artery stenosis ([place])    Carotid US 07/2019 bilateral ICA 40-59; bilateral subclavian stenosis   Vitamin D deficiency     Tobacco History: Social History   Tobacco Use  Smoking Status Former Smoker   Packs/day: 2.00   Years: 47.00   Pack years: 94.00   Types: Cigarettes   Quit date: 12/17/2012   Years since quitting: 6.8  Smokeless Tobacco Never Used   Counseling given: Not Answered   Outpatient Medications Prior to Visit  Medication Sig Dispense Refill   aspirin EC 81 MG tablet Take 1 tablet (81 mg total) by mouth daily. 90 tablet 3   cholecalciferol (VITAMIN D) 1000 units tablet Take 1,000 Units by mouth daily.      clindamycin (CLINDAGEL) 1 % gel APPLY TO face TWICE DAILY AS NEEDED     clonazePAM (KLONOPIN) 0.5 MG tablet Take 0.5 mg by mouth 2 (two) times daily as needed for anxiety.     Garlic 557 MG CAPS Take 500 mg by mouth daily.     hydrALAZINE (APRESOLINE) 50 MG tablet TAKE 1 tablet BY MOUTH THREE TIMES DAILY (Patient taking differently: Take 50 mg by mouth 3 (three) times daily. ) 270 tablet 3   losartan (COZAAR) 25 MG tablet Take 1 tablet (25 mg total) by mouth daily. Take 50 mg in the morning and 25 mg PM (Patient taking differently: Take 25-50 mg by mouth See admin instructions. Take 50 mg in the morning and 25 mg PM) 180 tablet 3   Potassium Chloride CR (MICRO-K) 8 MEQ CPCR capsule CR Take 2 capsules (16 mEq total) by mouth 2 (two) times daily. (Patient taking differently: Take 8 mEq by  mouth 2 (two) times daily. ) 120 capsule 11   rosuvastatin (CRESTOR) 40 MG tablet Take 40 mg by mouth at bedtime.      tadalafil (CIALIS) 20 MG tablet Take 20 mg by mouth daily as needed for erectile dysfunction.     ticagrelor (BRILINTA) 60 MG TABS tablet Take 1.5 tablets (90 mg total) by mouth 2 (two) times daily. 90 tablet 2   traMADol (ULTRAM) 50 MG tablet Take by mouth every 6 (six) hours as needed. Only as needed     triamterene-hydrochlorothiazide (MAXZIDE-25) 37.5-25 MG tablet TAKE TWO TABLETS BY MOUTH EVERY DAY (Patient taking differently: Take 2 tablets by mouth daily. ) 60 tablet 11  vitamin B-12 (CYANOCOBALAMIN) 1000 MCG tablet Take 1,000 mcg by mouth daily.     tadalafil, PAH, (ADCIRCA) 20 MG tablet Take 40 mg by mouth daily.     amLODipine (NORVASC) 10 MG tablet Take 1 tablet (10 mg total) by mouth daily. (Patient taking differently: Take 10 mg by mouth every evening. ) 90 tablet 3   No facility-administered medications prior to visit.      Review of Systems:   Constitutional:   No  weight loss, night sweats,  Fevers, chills, + fatigue, or  lassitude.  HEENT:   No headaches,  Difficulty swallowing,  Tooth/dental problems, or  Sore throat,                No sneezing, itching, ear ache, nasal congestion, post nasal drip,   CV:  No chest pain,  Orthopnea, PND, swelling in lower extremities, anasarca, dizziness, palpitations, syncope.   GI  No heartburn, indigestion, abdominal pain, nausea, vomiting, diarrhea, change in bowel habits, loss of appetite, bloody stools.   Resp:No excess mucus, no productive cough,  No non-productive cough,  No coughing up of blood.  No change in color of mucus.  No wheezing.  No chest wall deformity  Skin: no rash or lesions.  GU: no dysuria, change in color of urine, no urgency or frequency.  No flank pain, no hematuria   MS:  No joint pain or swelling.  No decreased range of motion.  No back pain.    Physical Exam  BP 114/74 (BP  Location: Left Arm, Cuff Size: Normal)    Pulse (!) 50    Temp 97.6 F (36.4 C) (Temporal)    Ht 5\' 5"  (1.651 m)    Wt 198 lb 3.2 oz (89.9 kg)    SpO2 98%    BMI 32.98 kg/m   GEN: A/Ox3; pleasant , NAD, obese per BMI   HEENT:  [place]/AT,   NOSE-clear, THROAT-clear, no lesions, no postnasal drip or exudate noted.   NECK:  Supple w/ fair ROM; no JVD; normal carotid impulses w/o bruits; no thyromegaly or nodules palpated; no lymphadenopathy.    RESP  Clear  P & A; w/o, wheezes/ rales/ or rhonchi. no accessory muscle use, no dullness to percussion  CARD:  RRR, no m/r/g, no peripheral edema, pulses intact, no cyanosis or clubbing.  GI:   Soft & nt; nml bowel sounds; no organomegaly or masses detected.   Musco: Warm bil, no deformities or joint swelling noted.   Neuro: alert, no focal deficits noted.    Skin: Warm, no lesions or rashes    Lab Results:  CBC  BNP  Imaging:     No flowsheet data found.  No results found for: NITRICOXIDE      Assessment & Plan:   Dyspnea Progressive dyspnea with activity over the last year suspect is multifactorial with multiple comorbidities , suspected deconditioning. Patient has a history of heavy smoking.  Previous PFTs 6 years ago were essentially unremarkable.  We will repeat PFTs to see if he is develop some underlying COPD.  Trial of Spiriva to see if this may help with his breathing. Has underwent cardiac work-up that was essentially unremarkable except for known coronary disease.   Plan  Patient Instructions  Begin Spiriva 2 puffs daily , rinse after  Activity as tolerated.  Continue on BiPAP at bedtime. Follow up in 4 weeks with Dr Halford Chessman with PFT and and As needed   Please contact office for sooner follow up if  symptoms do not improve or worsen or seek emergency care         Complex sleep apnea syndrome Continue on nocturnal BiPAP Work on healthy [employer], NP 10/10/2019

## 2019-10-10 NOTE — Patient Instructions (Addendum)
Begin Spiriva 2 puffs daily , rinse after  Activity as tolerated.  Continue on BiPAP at bedtime. Follow up in 4 weeks with Dr Halford Chessman with PFT and and As needed   Please contact office for sooner follow up if symptoms do not improve or worsen or seek emergency care

## 2019-10-10 NOTE — Telephone Encounter (Signed)
His symptoms could be from cardiac disease and/or pulmonary disease.    He needs to have ROV with one of the NPs this week.  He should also schedule follow up visit with cardiology.

## 2019-10-10 NOTE — Assessment & Plan Note (Signed)
Continue on nocturnal BiPAP Work on healthy weight

## 2019-10-10 NOTE — Telephone Encounter (Signed)
Spoke with pt. He is aware of Dr. Juanetta Gosling response. Pt has been scheduled to see Tammy today at 1615. Nothing further was needed.

## 2019-10-10 NOTE — Assessment & Plan Note (Addendum)
Progressive dyspnea with activity over the last year suspect is multifactorial with multiple comorbidities , suspected deconditioning. Patient has a history of heavy smoking.  Previous PFTs 6 years ago were essentially unremarkable.  We will repeat PFTs to see if he is develop some underlying COPD.  Trial of Spiriva to see if this may help with his breathing. Has underwent cardiac work-up that was essentially unremarkable except for known coronary disease.   Plan  Patient Instructions  Begin Spiriva 2 puffs daily , rinse after  Activity as tolerated.  Continue on BiPAP at bedtime. Follow up in 4 weeks with Dr Halford Chessman with PFT and and As needed   Please contact office for sooner follow up if symptoms do not improve or worsen or seek emergency care

## 2019-10-14 ENCOUNTER — Other Ambulatory Visit: Payer: Self-pay | Admitting: Physician Assistant

## 2019-10-14 ENCOUNTER — Other Ambulatory Visit: Payer: Self-pay

## 2019-10-14 DIAGNOSIS — I779 Disorder of arteries and arterioles, unspecified: Secondary | ICD-10-CM

## 2019-10-17 ENCOUNTER — Ambulatory Visit: Payer: PPO | Admitting: Family

## 2019-10-17 ENCOUNTER — Other Ambulatory Visit: Payer: PPO

## 2019-10-21 ENCOUNTER — Encounter: Payer: Self-pay | Admitting: Family

## 2019-10-21 ENCOUNTER — Ambulatory Visit (INDEPENDENT_AMBULATORY_CARE_PROVIDER_SITE_OTHER): Payer: PPO | Admitting: Family

## 2019-10-21 DIAGNOSIS — J441 Chronic obstructive pulmonary disease with (acute) exacerbation: Secondary | ICD-10-CM | POA: Diagnosis not present

## 2019-10-21 MED ORDER — DOXYCYCLINE HYCLATE 100 MG PO TABS: 100.0000 mg | ORAL_TABLET | Freq: Two times a day (BID) | ORAL | 0 refills | Status: DC
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
## 2019-10-25 ENCOUNTER — Telehealth: Payer: Self-pay | Admitting: Internal Medicine

## 2019-10-25 NOTE — Telephone Encounter (Signed)
Patient is calling for advice regarding his congestion that has gotten worse. Please advise CB- [phone]

## 2019-10-28 NOTE — Telephone Encounter (Signed)
Please advise 

## 2019-10-29 ENCOUNTER — Encounter: Payer: Self-pay | Admitting: Internal Medicine

## 2019-10-29 ENCOUNTER — Ambulatory Visit (INDEPENDENT_AMBULATORY_CARE_PROVIDER_SITE_OTHER): Payer: PPO | Admitting: Internal Medicine

## 2019-10-29 DIAGNOSIS — J441 Chronic obstructive pulmonary disease with (acute) exacerbation: Secondary | ICD-10-CM

## 2019-10-29 DIAGNOSIS — J069 Acute upper respiratory infection, unspecified: Secondary | ICD-10-CM | POA: Diagnosis not present

## 2019-10-29 DIAGNOSIS — I10 Essential (primary) hypertension: Secondary | ICD-10-CM | POA: Diagnosis not present

## 2019-10-29 DIAGNOSIS — I2510 Atherosclerotic heart disease of native coronary artery without angina pectoris: Secondary | ICD-10-CM | POA: Diagnosis not present

## 2019-10-29 MED ORDER — SPIRIVA RESPIMAT 2.5 MCG/ACT IN AERS: 2.0000 | INHALATION_SPRAY | Freq: Every day | RESPIRATORY_TRACT | 3 refills | Status: DC

## 2019-10-29 MED ORDER — METHYLPREDNISOLONE 4 MG PO TBPK: ORAL_TABLET | ORAL | 0 refills | Status: DC

## 2019-10-29 MED ORDER — CEFUROXIME AXETIL 250 MG PO TABS: 250.0000 mg | ORAL_TABLET | Freq: Two times a day (BID) | ORAL | 0 refills | Status: DC

## 2019-10-29 NOTE — Progress Notes (Signed)
Virtual Visit via Video Note  I connected with [name] on 10/29/19 at  3:20 PM EST by a video enabled telemedicine application and verified that I am speaking with the correct person using two identifiers.   I discussed the limitations of evaluation and management by telemedicine and the availability of in person appointments. The patient expressed understanding and agreed to proceed.  History of Present Illness: C/o chest congestion, sinus congestion chills cough 2-3 weeks duration.  He is not better on doxycycline.  Some wheezing. F/u HTN, CAD  There has been no  chest pain, shortness of breath, abdominal pain, diarrhea, constipation   Observations/Objective: The patient appears to be in no acute distress, looks ok  Assessment and Plan:  See my Assessment and Plan. Follow Up Instructions:    I discussed the assessment and treatment plan with the patient. The patient was provided an opportunity to ask questions and all were answered. The patient agreed with the plan and demonstrated an understanding of the instructions.   The patient was advised to call back or seek an in-person evaluation if the symptoms worsen or if the condition fails to improve as anticipated.  I provided face-to-face time during this encounter. We were at different locations.   [name], MD

## 2019-10-29 NOTE — Assessment & Plan Note (Signed)
Discontinue doxycycline Start cefuroxime and Medrol Dosepak Continue Spiriva

## 2019-10-29 NOTE — Assessment & Plan Note (Signed)
No angina.  Continue with aspirin and Lipitor

## 2019-10-29 NOTE — Assessment & Plan Note (Signed)
Discontinue doxycycline Start cefuroxime and Medrol Dosepak COVID-19 tested for fever/shortness of breath

## 2019-10-29 NOTE — Telephone Encounter (Signed)
Can we ask Douglas Christensen if she wants to do a VOV f/u? Thx

## 2019-10-29 NOTE — Assessment & Plan Note (Signed)
Continue hydralazine, amlodipine, Maxide

## 2019-10-29 NOTE — Telephone Encounter (Signed)
Pt has VOV today

## 2019-10-31 DIAGNOSIS — G4733 Obstructive sleep apnea (adult) (pediatric): Secondary | ICD-10-CM | POA: Diagnosis not present

## 2019-11-04 ENCOUNTER — Inpatient Hospital Stay: Payer: PPO | Attending: Family

## 2019-11-04 ENCOUNTER — Encounter: Payer: Self-pay | Admitting: Family

## 2019-11-04 ENCOUNTER — Other Ambulatory Visit: Payer: Self-pay

## 2019-11-04 ENCOUNTER — Inpatient Hospital Stay (HOSPITAL_BASED_OUTPATIENT_CLINIC_OR_DEPARTMENT_OTHER): Payer: PPO | Admitting: Family

## 2019-11-04 ENCOUNTER — Inpatient Hospital Stay: Payer: PPO

## 2019-11-04 VITALS — BP 157/38 | HR 80 | Temp 97.1°F | Resp 19 | Ht 65.0 in | Wt 197.8 lb

## 2019-11-04 DIAGNOSIS — E611 Iron deficiency: Secondary | ICD-10-CM | POA: Diagnosis not present

## 2019-11-04 DIAGNOSIS — D500 Iron deficiency anemia secondary to blood loss (chronic): Secondary | ICD-10-CM

## 2019-11-04 DIAGNOSIS — E538 Deficiency of other specified B group vitamins: Secondary | ICD-10-CM | POA: Diagnosis not present

## 2019-11-04 DIAGNOSIS — D45 Polycythemia vera: Secondary | ICD-10-CM

## 2019-11-04 DIAGNOSIS — G4730 Sleep apnea, unspecified: Secondary | ICD-10-CM | POA: Diagnosis not present

## 2019-11-04 DIAGNOSIS — D751 Secondary polycythemia: Secondary | ICD-10-CM | POA: Diagnosis not present

## 2019-11-04 DIAGNOSIS — F17210 Nicotine dependence, cigarettes, uncomplicated: Secondary | ICD-10-CM | POA: Insufficient documentation

## 2019-11-04 LAB — CMP (CANCER CENTER ONLY)
ALT: 52 U/L — ABNORMAL HIGH (ref 0–44)
AST: 35 U/L (ref 15–41)
Albumin: 4.6 g/dL (ref 3.5–5.0)
Alkaline Phosphatase: 43 U/L (ref 38–126)
Anion gap: 10 (ref 5–15)
BUN: 53 mg/dL — ABNORMAL HIGH (ref 8–23)
CO2: 21 mmol/L — ABNORMAL LOW (ref 22–32)
Calcium: 9.6 mg/dL (ref 8.9–10.3)
Chloride: 109 mmol/L (ref 98–111)
Creatinine: 1.36 mg/dL — ABNORMAL HIGH (ref 0.61–1.24)
GFR, Est AFR Am: >60 mL/min (ref >60)
GFR, Estimated: 53 mL/min — ABNORMAL LOW (ref >60)
Glucose, Bld: 160 mg/dL — ABNORMAL HIGH (ref 70–99)
Potassium: 4.3 mmol/L (ref 3.5–5.1)
Sodium: 140 mmol/L (ref 135–145)
Total Bilirubin: 0.3 mg/dL (ref 0.3–1.2)
Total Protein: 7 g/dL (ref 6.5–8.1)

## 2019-11-04 LAB — CBC WITH DIFFERENTIAL (CANCER CENTER ONLY)
Abs Immature Granulocytes: 0.15 10*3/uL — ABNORMAL HIGH (ref 0.00–0.07)
Basophils Absolute: 0.1 10*3/uL (ref 0.0–0.1)
Basophils Relative: 0 %
Eosinophils Absolute: 0.1 10*3/uL (ref 0.0–0.5)
Eosinophils Relative: 0 %
HCT: 33.2 % — ABNORMAL LOW (ref 39.0–52.0)
Hemoglobin: 11.3 g/dL — ABNORMAL LOW (ref 13.0–17.0)
Immature Granulocytes: 1 %
Lymphocytes Relative: 12 %
Lymphs Abs: 1.4 10*3/uL (ref 0.7–4.0)
MCH: 32.7 pg (ref 26.0–34.0)
MCHC: 34 g/dL (ref 30.0–36.0)
MCV: 96 fL (ref 80.0–100.0)
Monocytes Absolute: 0.8 10*3/uL (ref 0.1–1.0)
Monocytes Relative: 7 %
Neutro Abs: 8.9 10*3/uL — ABNORMAL HIGH (ref 1.7–7.7)
Neutrophils Relative %: 80 %
Platelet Count: 289 10*3/uL (ref 150–400)
RBC: 3.46 MIL/uL — ABNORMAL LOW (ref 4.22–5.81)
RDW: 13.6 % (ref 11.5–15.5)
WBC Count: 11.4 10*3/uL — ABNORMAL HIGH (ref 4.0–10.5)
nRBC: 0 % (ref 0.0–0.2)

## 2019-11-04 LAB — VITAMIN B12: Vitamin B-12: 510 pg/mL (ref 180–914)

## 2019-11-04 NOTE — Progress Notes (Signed)
Hematology and Oncology Follow Up Visit  Douglas Christensen [id number] February 05, 1951 68 y.o. 11/04/2019   Principle Diagnosis:  Secondarypolycythemia,due to smoking/sleep apnea  Current Therapy:   Phlebotomy to keep Hct < 45% EC ASA 81 mg po q day   Interim History:  Douglas Christensen is here today for follow-up. He is doing well and has recuperated nicely from having an arthrectomy of the right common iliac, external iliac and popliteal arteries stented in September.  He has had no weakness, swelling, tenderness, numbness or tingling in his extremities.  No falls or syncopal episodes.  Manual BP is 138/50.  He had some sinus congestion and is currently on a steroid dose pack. He finishes this tomorrow. No fever, chills, n/v, cough, rash, dizziness, SOB, chest pain, palpitations, abdominal pain or changes in bowel or bladder habits.  He is eating healthier and staying hydrated. His weight is stable.   ECOG Performance Status: 0 - Asymptomatic  Medications:  Allergies as of 11/04/2019      Reactions   Roxicodone [oxycodone Hcl] Other (See Comments)   hallucinations   Benazepril Cough   Gabapentin Rash   Itraconazole Nausea Only, Rash   Lipitor [atorvastatin Calcium]    cramps   Plavix [clopidogrel Bisulfate] Rash      Medication List       Accurate as of November 04, 2019  1:27 PM. If you have any questions, ask your nurse or doctor.        amLODipine 10 MG tablet Commonly known as: NORVASC Take 1 tablet (10 mg total) by mouth daily. What changed: when to take this   aspirin EC 81 MG tablet Take 1 tablet (81 mg total) by mouth daily.   cefUROXime 250 MG tablet Commonly known as: Ceftin Take 1 tablet (250 mg total) by mouth 2 (two) times daily.   cholecalciferol 1000 units tablet Commonly known as: VITAMIN D Take 1,000 Units by mouth daily.   clindamycin 1 % gel Commonly known as: CLINDAGEL APPLY TO face TWICE DAILY AS NEEDED   clonazePAM 0.5 MG tablet Commonly known as:  KLONOPIN Take 0.5 mg by mouth 2 (two) times daily as needed for anxiety.   doxycycline 100 MG tablet Commonly known as: VIBRA-TABS Take 1 tablet (100 mg total) by mouth 2 (two) times daily.   Garlic 258 MG Caps Take 500 mg by mouth daily.   hydrALAZINE 50 MG tablet Commonly known as: APRESOLINE TAKE 1 tablet BY MOUTH THREE TIMES DAILY What changed:   how much to take  how to take this  when to take this  additional instructions   losartan 25 MG tablet Commonly known as: COZAAR Take 1 tablet (25 mg total) by mouth daily. Take 50 mg in the morning and 25 mg PM What changed:   how much to take  when to take this   methylPREDNISolone 4 MG Tbpk tablet Commonly known as: MEDROL DOSEPAK As directed   Potassium Chloride CR 8 MEQ Cpcr capsule CR Commonly known as: MICRO-K Take 2 capsules (16 mEq total) by mouth 2 (two) times daily. What changed: how much to take   rosuvastatin 40 MG tablet Commonly known as: CRESTOR Take 1 tablet (40 mg total) by mouth daily.   Spiriva Respimat 2.5 MCG/ACT Aers Generic drug: Tiotropium Bromide Monohydrate Inhale 2 puffs into the lungs daily.   tadalafil 20 MG tablet Commonly known as: CIALIS Take 20 mg by mouth daily as needed for erectile dysfunction.   ticagrelor 60 MG Tabs tablet  Commonly known as: Brilinta Take 1.5 tablets (90 mg total) by mouth 2 (two) times daily.   traMADol 50 MG tablet Commonly known as: ULTRAM Take by mouth every 6 (six) hours as needed. Only as needed   triamterene-hydrochlorothiazide 37.5-25 MG tablet Commonly known as: MAXZIDE-25 TAKE TWO TABLETS BY MOUTH EVERY DAY   vitamin B-12 1000 MCG tablet Commonly known as: CYANOCOBALAMIN Take 1,000 mcg by mouth daily.       Allergies:  Allergies  Allergen Reactions  . Roxicodone [Oxycodone Hcl] Other (See Comments)    hallucinations  . Benazepril Cough  . Gabapentin Rash  . Itraconazole Nausea Only and Rash  . Lipitor [Atorvastatin Calcium]      cramps  . Plavix [Clopidogrel Bisulfate] Rash    Past Medical History, Surgical history, Social history, and Family History were reviewed and updated.  Review of Systems: All other 10 point review of systems is negative.   Physical Exam:  vitals were not taken for this visit.   Wt Readings from Last 3 Encounters:  10/10/19 198 lb 3.2 oz (89.9 kg)  10/07/19 195 lb (88.5 kg)  09/19/19 193 lb 6.4 oz (87.7 kg)    Ocular: Sclerae unicteric, pupils equal, round and reactive to light Ear-nose-throat: Oropharynx clear, dentition fair Lymphatic: No cervical or supraclavicular adenopathy Lungs no rales or rhonchi, good excursion bilaterally Heart regular rate and rhythm, no murmur appreciated Abd soft, nontender, positive bowel sounds, no liver or spleen tip palpated on exam, no fluid wave  MSK no focal spinal tenderness, no joint edema Neuro: non-focal, well-oriented, appropriate affect Breasts: Deferred   Lab Results  Component Value Date   WBC 9.4 07/17/2019   HGB 13.3 08/06/2019   HCT 39.0 08/06/2019   MCV 93.4 07/17/2019   PLT 251 07/17/2019   Lab Results  Component Value Date   FERRITIN 137 07/17/2019   IRON 91 05/03/2019   TIBC 385 05/03/2019   UIBC 294 05/03/2019   IRONPCTSAT 24 05/03/2019   Lab Results  Component Value Date   RETICCTPCT 2.1 (H) 07/13/2018   RBC 4.39 07/17/2019   No results found for: Nils Pyle [facility] Lab Results  Component Value Date   IGGSERUM 858 05/01/2008   IGMSERUM 134 05/01/2008   No results found for: Odetta Pink, SPEI   Chemistry      Component Value Date/Time   NA 137 08/06/2019 0855   NA 142 07/30/2019 0933   K 4.2 08/06/2019 0855   CL 106 08/06/2019 0855   CO2 19 (L) 07/30/2019 0933   BUN 34 (H) 08/06/2019 0855   BUN 38 (H) 07/30/2019 0933   CREATININE 1.30 (H) 08/06/2019 0855   CREATININE 1.34 (H) 07/17/2019 1057      Component Value Date/Time    CALCIUM 9.6 07/30/2019 0933   ALKPHOS 56 07/17/2019 1057   AST 26 07/17/2019 1057   ALT 27 07/17/2019 1057   BILITOT 0.7 07/17/2019 1057       Impression and Plan: Douglas Christensen is a very pleasant 68 yo caucasian withsecondarypolycythemia.  No phlebotomy needed this visit. Hct is 33.2%.  We will plan to see him back in another 3 months.  He will contact our office with any questions or concerns. We can certainly see him sooner if needed.   Laverna Peace, NP 12/7/20201:27 PM

## 2019-11-05 ENCOUNTER — Telehealth: Payer: Self-pay | Admitting: Hematology & Oncology

## 2019-11-05 LAB — IRON AND TIBC
Iron: 94 ug/dL (ref 42–163)
Saturation Ratios: 29 % (ref 20–55)
TIBC: 321 ug/dL (ref 202–409)
UIBC: 227 ug/dL (ref 117–376)

## 2019-11-05 LAB — FERRITIN: Ferritin: 163 ng/mL (ref 24–336)

## 2019-11-05 NOTE — Telephone Encounter (Signed)
Appointments scheduled calendar printed per 12/7 los 

## 2019-11-07 ENCOUNTER — Ambulatory Visit: Payer: PPO | Admitting: Adult Health

## 2019-11-12 ENCOUNTER — Other Ambulatory Visit: Payer: Self-pay | Admitting: Internal Medicine

## 2019-11-15 ENCOUNTER — Telehealth: Payer: Self-pay | Admitting: Cardiovascular Disease

## 2019-11-15 NOTE — Telephone Encounter (Signed)
I spoke to the patient who called because since his Losartan was changed to 50 mg in the AM and 25 mg in the PM on last OV 07/16/19, he has noticed a decrease in his HR to the 40s.  He has felt "sluggish" and "dizzy".  He wanted to reduce his Losartan to 25 mg Daily through the weekend and evaluate HR/BP/symptoms.  He will report back on Monday.

## 2019-11-15 NOTE — Telephone Encounter (Signed)
New message  STAT if HR is under 50 or over 120 (normal HR is 60-100 beats per minute)  1) What is your heart rate? 40's  2) Do you have a log of your heart rate readings (document readings)? No  3) Do you have any other symptoms? Dizziness due to medication

## 2019-11-17 NOTE — Telephone Encounter (Signed)
He should come in for an ECG.  If there is an available appt, add him on and get the ECG during the appt. Richardson Dopp, PA-C    11/17/2019 6:14 PM

## 2019-11-18 ENCOUNTER — Other Ambulatory Visit: Payer: Self-pay

## 2019-11-18 ENCOUNTER — Encounter: Payer: Self-pay | Admitting: Student

## 2019-11-18 ENCOUNTER — Ambulatory Visit: Payer: PPO | Admitting: Student

## 2019-11-18 ENCOUNTER — Ambulatory Visit: Payer: PPO | Admitting: Cardiology

## 2019-11-18 ENCOUNTER — Ambulatory Visit (INDEPENDENT_AMBULATORY_CARE_PROVIDER_SITE_OTHER): Payer: PPO | Admitting: Student

## 2019-11-18 ENCOUNTER — Encounter: Payer: Self-pay | Admitting: Cardiology

## 2019-11-18 VITALS — BP 144/70 | HR 96 | Ht 65.0 in | Wt 197.0 lb

## 2019-11-18 VITALS — BP 144/70 | HR 50 | Ht 65.0 in | Wt 197.0 lb

## 2019-11-18 DIAGNOSIS — R001 Bradycardia, unspecified: Secondary | ICD-10-CM

## 2019-11-18 DIAGNOSIS — I441 Atrioventricular block, second degree: Secondary | ICD-10-CM

## 2019-11-18 DIAGNOSIS — I714 Abdominal aortic aneurysm, without rupture, unspecified: Secondary | ICD-10-CM

## 2019-11-18 DIAGNOSIS — I10 Essential (primary) hypertension: Secondary | ICD-10-CM | POA: Diagnosis not present

## 2019-11-18 DIAGNOSIS — I2510 Atherosclerotic heart disease of native coronary artery without angina pectoris: Secondary | ICD-10-CM | POA: Diagnosis not present

## 2019-11-18 DIAGNOSIS — I739 Peripheral vascular disease, unspecified: Secondary | ICD-10-CM | POA: Diagnosis not present

## 2019-11-18 NOTE — Telephone Encounter (Signed)
    COVID-19 Pre-Screening Questions:  . In the past 7 to 10 days have you had a cough,  shortness of breath, headache, congestion, fever (100 or greater) body aches, chills, sore throat, or sudden loss of taste or sense of smell? no . Have you been around anyone with known Covid 19. no . Have you been around anyone who is awaiting Covid 19 test results in the past 7 to 10 days? no . Have you been around anyone who has been exposed to Covid 19, or has mentioned symptoms of Covid 19 within the past 7 to 10 days? no  If you have any concerns/questions about symptoms patients report during screening (either on the phone or at threshold). Contact the provider seeing the patient or DOD for further guidance.  If neither are available contact a member of the leadership team.     I spoke with pt and scheduled him to see Kathyrn Drown, NP today at 11:00. He is aware to arrive 15 minutes prior to appointment and wear a mask

## 2019-11-18 NOTE — Progress Notes (Signed)
PCP:  Cassandria Anger, MD Primary Cardiologist: Sherren Mocha, MD Electrophysiologist: Dr. Meredith Mody is a 68 y.o. male with past medical history of CAD s/p CABG 04/2013, stable Myoview 05/2019 with EF 60%, PAD s/p R SFA stent and L CIA stent in 2008, L EIA/CFA/PFA/SFA endarterectomy, right fem-pop 2014, left SFA stent 2016, and intermittent second degree AV block type that previously resolved off beta blockers who presents today for add on due to symptomatic bradycardia. They are seen for Dr. Rayann Heman.  He has had fatigue and mild lightheadedness for the past 5-6 weeks. Has noted low HRs in the 40-50s on his home BP monitor. He denies significant dizziness, near syncope, or syncope. Main complaint is fatigue. He is not on any AV nodal blockade. He denies any ischemic symptoms, and had an stable myoview 05/2019 when compared to [facility] last year.   The patient feels that he is tolerating medications without difficulties and is otherwise without complaint today.   Past Medical History:  Diagnosis Date  . Anxiety   . Arthritis   . CAD (coronary artery disease)    Mild plaque (cath "years ago"); abnormal Myoview 04/2013 with subsequent CABG x 5 with LIMA to LAD, SVG to OM1, SVG to DX, SVG to PD & PL.   . Carotid artery disease ([place])    Carotid US 07/2019 bilateral ICA 40-59; bilateral subclavian stenosis  . Cataract   . GERD (gastroesophageal reflux disease)   . History of colonic polyps   . History of echocardiogram    Echo 2/19: Mild LVH, EF 60-65, normal wall motion, grade 2 diastolic dysfunction, mild RAE  . History of nuclear stress test    Myoview 2/19: inf/inf-lat/apical inf/ant-sept defect (?diaph atten - cannot rule out peri-infarct ischemia), PVCs/PACs/Mobitz 1 // Myoview 05/2019: EF 60, inf infarct with mild peri-infarct ischemia; no significant change when compared to prior study; Intermediate Risk  . Hx of echocardiogram    Echo (9/15):  EF 55-60%; Gr 2 DD, mild BAE  .  Hyperlipidemia   . Hypertension   . LBP (low back pain)   . Meralgia paresthetica of left side 2011  . PVD (peripheral vascular disease) ([place])    Stent to left common femoral and right superficial femoral.  2008.  50%  left renal   . Second degree AV block, Mobitz type I    Holter 2/19: Sinus rhythm, average heart rate 72, frequent PVCs (burden 5%), second-degree type I AV block and periods of 2:1 heart block >> continue clinical managment and avoid AVN blocking agents  . Shortness of breath    "once in awhile; can happen at anytime" (08/26/2013)  . Sleep apnea    mod OSA, central sleep apnea/hypoapnea syndrome 11/22/12, CPAP every night   . Subclavian artery stenosis ([place])    Carotid US 07/2019 bilateral ICA 40-59; bilateral subclavian stenosis  . Vitamin D deficiency    Past Surgical History:  Procedure Laterality Date  . ABDOMINAL AORTAGRAM N/A 11/16/2011   Procedure: ABDOMINAL Maxcine Ham;  Surgeon: Sherren Mocha, MD;  Location: [facility] CATH LAB;  Service: Cardiovascular;  Laterality: N/A;  . ABDOMINAL AORTAGRAM N/A 11/14/2012   Procedure: ABDOMINAL Maxcine Ham;  Surgeon: Sherren Mocha, MD;  Location: [facility] CATH LAB;  Service: Cardiovascular;  Laterality: N/A;  . ABDOMINAL AORTAGRAM N/A 12/30/2014   Procedure: ABDOMINAL Maxcine Ham;  Surgeon: Serafina Mitchell, MD;  Location: [facility] CATH LAB;  Service: Cardiovascular;  Laterality: N/A;  . ABDOMINAL AORTOGRAM W/LOWER EXTREMITY Bilateral 08/06/2019  Procedure: ABDOMINAL AORTOGRAM W/LOWER EXTREMITY;  Surgeon: Serafina Mitchell, MD;  Location: [place] CV LAB;  Service: Cardiovascular;  Laterality: Bilateral;  . CARDIAC CATHETERIZATION  05/02/13   x2   . CORONARY ARTERY BYPASS GRAFT N/A 05/06/2013   Procedure: CORONARY ARTERY BYPASS GRAFTING (CABG);  Surgeon: Melrose Nakayama, MD;  Location: [place];  Service: Open Heart Surgery;  Laterality: N/A;  Coronary artery bypass graft times five using left internal mammary artery and left greater saphenous vein via  endovein harvest.  . ENDARTERECTOMY FEMORAL Left 01/29/2015   Procedure: ENDARTERECTOMY FEMORAL WITH PATCH ANGIOPLASTY;  Surgeon: Serafina Mitchell, MD;  Location: [place];  Service: Vascular;  Laterality: Left;  . EYE SURGERY  03/24/16   cataract surgery on left eye  . FEMORAL-POPLITEAL BYPASS GRAFT  12/27/2012   Procedure: BYPASS GRAFT FEMORAL-POPLITEAL ARTERY;  Surgeon: Serafina Mitchell, MD;  Location: [facility] OR;  Service: Vascular;  Laterality: Right;  using non-reversed sapphenous vein.  Marland Kitchen ILIAC ATHERECTOMY Left 01/29/2015   Procedure: SUPERFICIAL FEMORAL ARTERY ATHERECTOMY/PERCUTANEOUS TRANSLUMINAL ANGIOPLASTY; superficial femoral artery stent;  Surgeon: Serafina Mitchell, MD;  Location: [place];  Service: Vascular;  Laterality: Left;  . LOWER EXTREMITY ANGIOGRAM Bilateral 11/16/2011   Procedure: LOWER EXTREMITY ANGIOGRAM;  Surgeon: Sherren Mocha, MD;  Location: [facility] CATH LAB;  Service: Cardiovascular;  Laterality: Bilateral;  . lower extremity stents     bilateral lower extremities x 2  . PERCUTANEOUS STENT INTERVENTION Left 11/16/2011   Procedure: PERCUTANEOUS STENT INTERVENTION;  Surgeon: Sherren Mocha, MD;  Location: [facility] CATH LAB;  Service: Cardiovascular;  Laterality: Left;  . PERIPHERAL VASCULAR ATHERECTOMY Right 08/06/2019   Procedure: PERIPHERAL VASCULAR ATHERECTOMY;  Surgeon: Serafina Mitchell, MD;  Location: [place] CV LAB;  Service: Cardiovascular;  Laterality: Right;  Common iliac, external iliac, and popliteal.  . PERIPHERAL VASCULAR INTERVENTION Right 08/06/2019   Procedure: PERIPHERAL VASCULAR INTERVENTION;  Surgeon: Serafina Mitchell, MD;  Location: [place] CV LAB;  Service: Cardiovascular;  Laterality: Right;  common iliac, external iliac, and popliteal.  . TONSILLECTOMY    . TOTAL HIP ARTHROPLASTY Left 06/29/2015   Procedure: TOTAL HIP ARTHROPLASTY;  Surgeon: Frederik Pear, MD;  Location: [place];  Service: Orthopedics;  Laterality: Left;  LEFT TOTAL HIP ARTHROPLASTY DEPUY SROM/PINNACLE     Current Outpatient Medications  Medication Sig Dispense Refill  . amLODipine (NORVASC) 10 MG tablet Take 10 mg by mouth daily.    Marland Kitchen aspirin EC 81 MG tablet Take 1 tablet (81 mg total) by mouth daily. 90 tablet 3  . BRILINTA 90 MG TABS tablet Take 90 mg by mouth 2 (two) times daily.    . cholecalciferol (VITAMIN D) 1000 units tablet Take 1,000 Units by mouth daily.     . clindamycin (CLINDAGEL) 1 % gel APPLY TO face TWICE DAILY AS NEEDED    . clonazePAM (KLONOPIN) 0.5 MG tablet TAKE ONE TABLET TWICE DAILY AS NEEDED FOR ANXIETY 60 tablet 3  . doxycycline (VIBRA-TABS) 100 MG tablet Take 1 tablet (100 mg total) by mouth 2 (two) times daily. 14 tablet 0  . Garlic 119 MG CAPS Take 500 mg by mouth daily.    . hydrALAZINE (APRESOLINE) 50 MG tablet Take 50 mg by mouth 3 (three) times daily.    Marland Kitchen losartan (COZAAR) 25 MG tablet Take 25 mg by mouth daily.    . Potassium Chloride CR (MICRO-K) 8 MEQ CPCR capsule CR Take 8 mEq by mouth daily.    . rosuvastatin (CRESTOR) 40 MG tablet  Take 1 tablet (40 mg total) by mouth daily. 90 tablet 2  . tadalafil (CIALIS) 20 MG tablet Take 20 mg by mouth daily as needed for erectile dysfunction.    . Tiotropium Bromide Monohydrate (SPIRIVA RESPIMAT) 2.5 MCG/ACT AERS Inhale 2 puffs into the lungs daily. 12 g 3  . traMADol (ULTRAM) 50 MG tablet Take by mouth every 6 (six) hours as needed. Only as needed    . triamterene-hydrochlorothiazide (MAXZIDE-25) 37.5-25 MG tablet Take 2 tablets by mouth daily.    . vitamin B-12 (CYANOCOBALAMIN) 1000 MCG tablet Take 1,000 mcg by mouth daily.     No current facility-administered medications for this visit.    Allergies  Allergen Reactions  . Roxicodone [Oxycodone Hcl] Other (See Comments)    hallucinations  . Benazepril Cough  . Gabapentin Rash  . Itraconazole Nausea Only and Rash  . Lipitor [Atorvastatin Calcium]     cramps  . Plavix [Clopidogrel Bisulfate] Rash    Social History   Socioeconomic History  . Marital  status: Widowed    Spouse name: Not on file  . Number of children: 2  . Years of education: Not on file  . Highest education level: Not on file  Occupational History  . Occupation: English as a second language teacher: [employer]  Tobacco Use  . Smoking status: Former Smoker    Packs/day: 2.00    Years: 47.00    Pack years: 94.00    Types: Cigarettes    Quit date: 12/17/2012    Years since quitting: 6.9  . Smokeless tobacco: Never Used  Substance and Sexual Activity  . Alcohol use: Yes    Alcohol/week: 4.0 standard drinks    Types: 4 Shots of liquor per week    Comment: 08/26/2013 "3-4 mixed drinks/wk"  . Drug use: No  . Sexual activity: Yes  Other Topics Concern  . Not on file  Social History Narrative   Widower   Social Determinants of Health   Financial Resource Strain:   . Difficulty of Paying Living Expenses: Not on file  Food Insecurity:   . Worried About Charity fundraiser in the Last Year: Not on file  . Ran Out of Food in the Last Year: Not on file  Transportation Needs:   . Lack of Transportation (Medical): Not on file  . Lack of Transportation (Non-Medical): Not on file  Physical Activity:   . Days of Exercise per Week: Not on file  . Minutes of Exercise per Session: Not on file  Stress:   . Feeling of Stress : Not on file  Social Connections:   . Frequency of Communication with Friends and Family: Not on file  . Frequency of Social Gatherings with Friends and Family: Not on file  . Attends Religious Services: Not on file  . Active Member of Clubs or Organizations: Not on file  . Attends Archivist Meetings: Not on file  . Marital Status: Not on file  Intimate Partner Violence:   . Fear of Current or Ex-Partner: Not on file  . Emotionally Abused: Not on file  . Physically Abused: Not on file  . Sexually Abused: Not on file     Review of Systems: General: No chills, fever, night sweats or weight changes  Cardiovascular:  No chest pain, dyspnea on  exertion, edema, orthopnea, palpitations, paroxysmal nocturnal dyspnea Dermatological: No rash, lesions or masses Respiratory: No cough, dyspnea Urologic: No hematuria, dysuria Abdominal: No nausea, vomiting, diarrhea, bright red blood per rectum,  melena, or hematemesis Neurologic: No visual changes, weakness, changes in mental status All other systems reviewed and are otherwise negative except as noted above.  Physical Exam: Vitals:   11/18/19 1321  BP: (!) 144/70  Pulse: (!) 50  SpO2: 98%  Weight: 197 lb (89.4 kg)  Height: 5\' 5"  (1.651 m)    GEN- The patient is well appearing, alert and oriented x 3 today.   HEENT: normocephalic, atraumatic; sclera clear, conjunctiva pink; hearing intact; oropharynx clear; neck supple, no JVP Lymph- no cervical lymphadenopathy Lungs- Clear to ausculation bilaterally, normal work of breathing.  No wheezes, rales, rhonchi Heart- Slightly bradycardia and slightly irregular rate and rhythm, no murmurs, rubs or gallops, PMI not laterally displaced GI- soft, non-tender, non-distended, bowel sounds present, no hepatosplenomegaly Extremities- no clubbing, cyanosis, or edema; DP/PT/radial pulses 2+ bilaterally MS- no significant deformity or atrophy Skin- warm and dry, no rash or lesion Psych- euthymic mood, full affect Neuro- strength and sensation are intact  EKG is ordered. Personal review of EKG from today shows intermittent 2:1 AV block with HR of 50 bpm.   Assessment and Plan:  1. Symptomatic 2:1 AV block With h/o of intermittent AV block on BB, that resolved once taken off.   Now with symptomatic second degree AV block without any AVnodal blockade on board. Main complaint is fatigue and decreased exercise tolerance. No syncope or near syncope.  Discussed risks and benefits of pacemaker at length. He will see Dr. Rayann Heman in a virtual visit early next week to discuss further and plan/schedule PPM.  Pt aware if he has syncope or near syncope to go  to ER via 911 or another driver, and not to drive himself. Thus far, he has had no near-syncope.  2. CAD Recent work up showed inferior defect suggestive of scarring with peri-infarct ischemia and LVEF of 60% unchanged from prior study (12/2017)  3. HTN No adjustment today. Would consider BB s/p pacing.   Pt now with symptomatic 2nd degree AV block without reversible cause.  Discussed PPM implantation today.  Discussed with Dr. Jackalyn Lombard RN, and will plan on virtual visit with him next week to discuss further and likely schedule.  Pt knows if he has syncope to call 911, and not to drive himself to the ED. Knows to call back with worsening symptoms otherwise.   Shirley Friar, PA-C  11/18/19 1:21 PM

## 2019-11-18 NOTE — Patient Instructions (Signed)
Medication Instructions:   Your physician recommends that you continue on your current medications as directed. Please refer to the Current Medication list given to you today.  *If you need a refill on your cardiac medications before your next appointment, please call your pharmacy*  Lab Work:  None ordered today  Testing/Procedures:  None ordered today  Follow-Up:  Follow up as discussed on Monday 11/25/19 with Thompson Grayer, MD to discuss pacemaker.

## 2019-11-18 NOTE — Progress Notes (Signed)
Cardiology Office Note   Date:  11/18/2019   ID:  [name], DOB 19-Apr-1951, MRN [id number]  PCP:  Cassandria Anger, MD  Cardiologist: Dr. Burt Knack, MD  Chief Complaint  Patient presents with  . Bradycardia    History of Present Illness: Douglas Christensen is a 68 y.o. male who presents for dizziness, seen for Dr. Burt Knack.  Mr. Rotert has a history of CAD status post CABG in 04/2013 with follow-up Myoview 7/20 with an LVEF of 60%, inferior scar with peri-infarct ischemia, unchanged from 2019.  Also with a history of PAD status post right SFA stent and L CIA stent in 2008, L EIA stent in 2012, L EIA/CFA/PFA/SFA endarterectomy, right femoropopliteal 2014 and left SFA stent in 2016, second-degree heart block type I in 2018 with resolved with stopping beta-blocker, hypertension, hyperlipidemia, OSA, GERD and prior EtOH abuse.  He was last seen in telemedicine visit by Douglas Dopp, PA 07/16/2019. Prior to that he was seen in 04/2019 with worsening shortness of breath in which a proBNP was ordered and found to be normal. A Myoview stress test demonstrated inferior defect suggestive of scarring with peri-infarct ischemia and LVEF of 60% unchanged from prior study.  Medical treatment was recommended.  On follow-up in 06/2019 he continued to have chronic shortness of breath, unchanged from prior visits.  He had no anginal symptoms however was having lower extremity discomfort with activity with known PAD. Studies from 04/2018 indicated some progression of disease in which he was to have repeat studies 04/2019.  Per chart review, patient called the office on 11/15/2019 for symptoms of bradycardia with HR in the 40s also with dizziness thought to be due to medication.  Plan was for follow-up in-office visit with EKG to further evaluate rhythm.  In chart review, he was previously followed by Dr. Rayann Heman for history of 2-1 AV heart block.  Initially was seen in the emergency department 06/2017 after his heart  rate was found to be 29 bpm.  He was seen in consultation by EP in which his beta-blocker was held and his heart rate normalized.  He has since been followed by Dr. Rayann Heman and has not required any further interventions.  He presents today and reports that for the last 5 to 6 weeks he has noticed his heart rate on his BP monitor and his phone to be in the 40s when his typical heart rate is in the 70s to 80s.  He reports more fatigue and baseline mild dizziness after antihypertensive medications which is been unchanged.  As above, patient called the office with concerns for bradycardia with recommendations for an office EKG.  EKG today shows second-degree type II AV block with an HR of 50 bpm.  Case discussed with Dr. Johnsie Cancel who agrees that a prompt EP evaluation is indicated however no urgent hospitalization is necessary at this point given no significant dizziness, presyncopal or syncopal episodes.  Appointment with Douglas Kilts, PA on same day was scheduled for further evaluation and likely PPM placement.  Past Medical History:  Diagnosis Date  . Anxiety   . Arthritis   . CAD (coronary artery disease)    Mild plaque (cath "years ago"); abnormal Myoview 04/2013 with subsequent CABG x 5 with LIMA to LAD, SVG to OM1, SVG to DX, SVG to PD & PL.   . Carotid artery disease ([place])    Carotid US 07/2019 bilateral ICA 40-59; bilateral subclavian stenosis  . Cataract   . GERD (gastroesophageal  reflux disease)   . History of colonic polyps   . History of echocardiogram    Echo 2/19: Mild LVH, EF 60-65, normal wall motion, grade 2 diastolic dysfunction, mild RAE  . History of nuclear stress test    Myoview 2/19: inf/inf-lat/apical inf/ant-sept defect (?diaph atten - cannot rule out peri-infarct ischemia), PVCs/PACs/Mobitz 1 // Myoview 05/2019: EF 60, inf infarct with mild peri-infarct ischemia; no significant change when compared to prior study; Intermediate Risk  . Hx of echocardiogram    Echo (9/15):  EF  55-60%; Gr 2 DD, mild BAE  . Hyperlipidemia   . Hypertension   . LBP (low back pain)   . Meralgia paresthetica of left side 2011  . PVD (peripheral vascular disease) ([place])    Stent to left common femoral and right superficial femoral.  2008.  50%  left renal   . Second degree AV block, Mobitz type I    Holter 2/19: Sinus rhythm, average heart rate 72, frequent PVCs (burden 5%), second-degree type I AV block and periods of 2:1 heart block >> continue clinical managment and avoid AVN blocking agents  . Shortness of breath    "once in awhile; can happen at anytime" (08/26/2013)  . Sleep apnea    mod OSA, central sleep apnea/hypoapnea syndrome 11/22/12, CPAP every night   . Subclavian artery stenosis ([place])    Carotid US 07/2019 bilateral ICA 40-59; bilateral subclavian stenosis  . Vitamin D deficiency     Past Surgical History:  Procedure Laterality Date  . ABDOMINAL AORTAGRAM N/A 11/16/2011   Procedure: ABDOMINAL Maxcine Ham;  Surgeon: Douglas Mocha, MD;  Location: [facility] CATH LAB;  Service: Cardiovascular;  Laterality: N/A;  . ABDOMINAL AORTAGRAM N/A 11/14/2012   Procedure: ABDOMINAL Maxcine Ham;  Surgeon: Douglas Mocha, MD;  Location: [facility] CATH LAB;  Service: Cardiovascular;  Laterality: N/A;  . ABDOMINAL AORTAGRAM N/A 12/30/2014   Procedure: ABDOMINAL Maxcine Ham;  Surgeon: Douglas Mitchell, MD;  Location: [facility] CATH LAB;  Service: Cardiovascular;  Laterality: N/A;  . ABDOMINAL AORTOGRAM W/LOWER EXTREMITY Bilateral 08/06/2019   Procedure: ABDOMINAL AORTOGRAM W/LOWER EXTREMITY;  Surgeon: Douglas Mitchell, MD;  Location: [place] CV LAB;  Service: Cardiovascular;  Laterality: Bilateral;  . CARDIAC CATHETERIZATION  05/02/13   x2   . CORONARY ARTERY BYPASS GRAFT N/A 05/06/2013   Procedure: CORONARY ARTERY BYPASS GRAFTING (CABG);  Surgeon: Douglas Nakayama, MD;  Location: [place];  Service: Open Heart Surgery;  Laterality: N/A;  Coronary artery bypass graft times five using left internal mammary artery and  left greater saphenous vein via endovein harvest.  . ENDARTERECTOMY FEMORAL Left 01/29/2015   Procedure: ENDARTERECTOMY FEMORAL WITH PATCH ANGIOPLASTY;  Surgeon: Douglas Mitchell, MD;  Location: [place];  Service: Vascular;  Laterality: Left;  . EYE SURGERY  03/24/16   cataract surgery on left eye  . FEMORAL-POPLITEAL BYPASS GRAFT  12/27/2012   Procedure: BYPASS GRAFT FEMORAL-POPLITEAL ARTERY;  Surgeon: Douglas Mitchell, MD;  Location: [facility] OR;  Service: Vascular;  Laterality: Right;  using non-reversed sapphenous vein.  Marland Kitchen ILIAC ATHERECTOMY Left 01/29/2015   Procedure: SUPERFICIAL FEMORAL ARTERY ATHERECTOMY/PERCUTANEOUS TRANSLUMINAL ANGIOPLASTY; superficial femoral artery stent;  Surgeon: Douglas Mitchell, MD;  Location: [place];  Service: Vascular;  Laterality: Left;  . LOWER EXTREMITY ANGIOGRAM Bilateral 11/16/2011   Procedure: LOWER EXTREMITY ANGIOGRAM;  Surgeon: Douglas Mocha, MD;  Location: [facility] CATH LAB;  Service: Cardiovascular;  Laterality: Bilateral;  . lower extremity stents     bilateral lower extremities x 2  . PERCUTANEOUS STENT  INTERVENTION Left 11/16/2011   Procedure: PERCUTANEOUS STENT INTERVENTION;  Surgeon: Douglas Mocha, MD;  Location: [facility] CATH LAB;  Service: Cardiovascular;  Laterality: Left;  . PERIPHERAL VASCULAR ATHERECTOMY Right 08/06/2019   Procedure: PERIPHERAL VASCULAR ATHERECTOMY;  Surgeon: Douglas Mitchell, MD;  Location: [place] CV LAB;  Service: Cardiovascular;  Laterality: Right;  Common iliac, external iliac, and popliteal.  . PERIPHERAL VASCULAR INTERVENTION Right 08/06/2019   Procedure: PERIPHERAL VASCULAR INTERVENTION;  Surgeon: Douglas Mitchell, MD;  Location: [place] CV LAB;  Service: Cardiovascular;  Laterality: Right;  common iliac, external iliac, and popliteal.  . TONSILLECTOMY    . TOTAL HIP ARTHROPLASTY Left 06/29/2015   Procedure: TOTAL HIP ARTHROPLASTY;  Surgeon: Frederik Pear, MD;  Location: [place];  Service: Orthopedics;  Laterality: Left;  LEFT TOTAL HIP  ARTHROPLASTY DEPUY SROM/PINNACLE   Current Outpatient Medications  Medication Sig Dispense Refill  . amLODipine (NORVASC) 10 MG tablet Take 10 mg by mouth daily.    Marland Kitchen aspirin EC 81 MG tablet Take 1 tablet (81 mg total) by mouth daily. 90 tablet 3  . cholecalciferol (VITAMIN D) 1000 units tablet Take 1,000 Units by mouth daily.     . clindamycin (CLINDAGEL) 1 % gel APPLY TO face TWICE DAILY AS NEEDED    . clonazePAM (KLONOPIN) 0.5 MG tablet TAKE ONE TABLET TWICE DAILY AS NEEDED FOR ANXIETY 60 tablet 3  . doxycycline (VIBRA-TABS) 100 MG tablet Take 1 tablet (100 mg total) by mouth 2 (two) times daily. 14 tablet 0  . Garlic 440 MG CAPS Take 500 mg by mouth daily.    . hydrALAZINE (APRESOLINE) 50 MG tablet Take 50 mg by mouth 3 (three) times daily.    Marland Kitchen losartan (COZAAR) 25 MG tablet Take 25 mg by mouth daily.    . Potassium Chloride CR (MICRO-K) 8 MEQ CPCR capsule CR Take 8 mEq by mouth daily.    . rosuvastatin (CRESTOR) 40 MG tablet Take 1 tablet (40 mg total) by mouth daily. 90 tablet 2  . tadalafil (CIALIS) 20 MG tablet Take 20 mg by mouth daily as needed for erectile dysfunction.    . Tiotropium Bromide Monohydrate (SPIRIVA RESPIMAT) 2.5 MCG/ACT AERS Inhale 2 puffs into the lungs daily. 12 g 3  . traMADol (ULTRAM) 50 MG tablet Take by mouth every 6 (six) hours as needed. Only as needed    . triamterene-hydrochlorothiazide (MAXZIDE-25) 37.5-25 MG tablet Take 2 tablets by mouth daily.    . vitamin B-12 (CYANOCOBALAMIN) 1000 MCG tablet Take 1,000 mcg by mouth daily.    Marland Kitchen BRILINTA 90 MG TABS tablet Take 90 mg by mouth 2 (two) times daily.     No current facility-administered medications for this visit.    Allergies:   Roxicodone [oxycodone hcl], Benazepril, Gabapentin, Itraconazole, Lipitor [atorvastatin calcium], and Plavix [clopidogrel bisulfate]    Social History:  The patient  reports that he quit smoking about 6 years ago. His smoking use included cigarettes. He has a 94.00 pack-year  smoking history. He has never used smokeless tobacco. He reports current alcohol use of about 4.0 standard drinks of alcohol per week. He reports that he does not use drugs.   Family History:  The patient's family history includes Alzheimer's disease in his father; Cancer in his father and mother; Heart disease in his mother; Hyperlipidemia in his mother; Hypertension in his mother.    ROS:  Please see the history of present illness. Otherwise, review of systems are positive for none.   All other  systems are reviewed and negative.    PHYSICAL EXAM: VS:  BP (!) 144/70   Pulse 96   Ht 5\' 5"  (1.651 m)   Wt 197 lb (89.4 kg)   SpO2 98%   BMI 32.78 kg/m  , BMI Body mass index is 32.78 kg/m.   General: Well developed, well nourished, NAD Neck: Negative for carotid bruits. No JVD Lungs:Clear to ausculation bilaterally. No wheezes, rales, or rhonchi. Breathing is unlabored. Cardiovascular: RRR with S1 S2. No murmurs Extremities: No edema. No clubbing or cyanosis. DP pulses 1+ bilaterally Neuro: Alert and oriented. No focal deficits. No facial asymmetry. MAE spontaneously. Psych: Responds to questions appropriately with normal affect.    EKG:  EKG is ordered today. The ekg ordered today demonstrates 2nd degree type II AV block with HR 50bpm.   Recent Labs: 06/04/2019: NT-Pro BNP 93 11/04/2019: ALT 52; BUN 53; Creatinine 1.36; Hemoglobin 11.3; Platelet Count 289; Potassium 4.3; Sodium 140    Lipid Panel    Component Value Date/Time   CHOL 134 04/02/2019 0956   TRIG 214 (H) 04/02/2019 0956   TRIG 118 09/25/2006 1002   HDL 40 04/02/2019 0956   CHOLHDL 3.4 04/02/2019 0956   CHOLHDL 5 06/07/2018 1121   VLDL 57.4 (H) 06/07/2018 1121   LDLCALC 51 04/02/2019 0956   LDLDIRECT 148.0 06/07/2018 1121     Wt Readings from Last 3 Encounters:  11/18/19 197 lb (89.4 kg)  11/04/19 197 lb 12.8 oz (89.7 kg)  10/10/19 198 lb 3.2 oz (89.9 kg)     Other studies Reviewed: Additional studies/  records that were reviewed today include:   Lumbar MRI 06/28/2019 IMPRESSION: - L4-L5 spondylosis and degenerative grade 1 anterolisthesis have progressed since CT myelogram 04/20/2015. Mild bilateral subarticular and central canal narrowing at this level, as well as mild left neural foraminal narrowing.  - Moderate L5-S1 facet arthrosis has also progressed. Lumbar spondylosis is otherwise unchanged. No significant canal or neural foraminal narrowing at the remaining levels.  - Enlarged suprarenal abdominal aorta measuring 3.2 cm in diameter. Recommend followup by ultrasound in 3 years. This recommendation follows ACR consensus guidelines: White Paper of the ACR Incidental Findings Committee II on Vascular Findings. J Am Coll Radiol 2013; 10:789-794. Aortic aneurysm NOS (ICD10-I71.9)  Myoview 06/04/2019  Nuclear stress EF: 60%.  There was no ST segment deviation noted during stress.  Defect 1: There is a medium defect of moderate severity present in the basal inferior, mid inferior and apical inferior location.  Findings consistent with prior myocardial infarction with peri-infarct ischemia.  This is an intermediate risk study.  The left ventricular ejection fraction is normal (55-65%).  Medium size defect in the inferior wall from apex to base, with partial reversibility from moderate to mild, suggesting peri-infarct ischemia.  Compared to prior study, no significant change has occurred. Extracardiac uptake is also seen on today's exam and may be contributing in a similar fashion to attenuation artifact.   Holter2/13/19 1. The basic rhythm is normal sinus with an average heart rate of 72 bpm 2. There are frequent PVCs (5% burden) 3. There is second-degree type I AV block and periods of 2: 1 heart block  Echo 01/22/18 Mild LVH, EF 09-32, grade 2 diastolic dysfunction, mild RAE  Nuclear stress test 01/10/18 Inferior/inferolateral/apical inferior defect with minimal  reversibility and anteroseptal wall-likely diaphragmatic attenuation; cannot rule out infarct with peri-infarct ischemia; sinus rhythm with PVCs, PACs and intermittent type I second-degree AV block which improved with increased heart rate; intermediate  risk  Echo 07/2014 EF 55-60%, Gr 2 DD Mild BAE  LHC (6/14): prox and mid LM 40-50%, prox LAD 70%, ostial Dx 75%, mid LAD 90-95%, prox CFX 60-70%, mid RCA occluded, EF 55-65% >>> CABG  Carotid US (1/14): Bilateral ICA 1-39%  Carotid US (1/14): Bilateral ICA 1-39%  ASSESSMENT AND PLAN:  1. Second degree type II AV block: -As above, patient has noticed bradycardia on his BP monitor for the last 5 to 6 weeks, mild/unchanged dizziness and fatigue in which he called the office on 11/15/2019 with recommendations for in person follow-up visit with EKG.  -EKG today with second-degree type II AV block (history of prior second-degree type II AV block in 2018, followed by Dr. Rayann Heman) resolved with the discontinuation of BB>> seen by EP while here in the office by Douglas Kilts, PA with plans to be seen by Dr. Rayann Heman in telemedicine visit 12/25/2018 to discuss PPM placement.  ED precautions, including presyncopal or syncopal episodes, reviewed by myself as well as Douglas Kilts, PA in which the patient understands and agrees. -Not on AV blocking agents  2. Coronary artery disease s/p CABG: -Recent nuclear stress test demonstrated inferior scar with peri-infarct ischemia with no significant ischemia with recommendations for continued medical therapy  -Denies anginal symptoms -Continue amlodipine, aspirin, rosuvastatin  3. Chronic shortness of breath: -Chronic, unchanged>>EF was normal on recent nuclear stress test -Not discussed today  3. Hypertension: -Stable, 144/70 -Losartan increased at last OV to 50 mg in the morning and 25 mg in the evening.  -Continue current regimen -Not on AV blocking agents   4. Hyperlipidemia: -LDL optimal  on most recent lab work.   -Continue current regimen   5. Peripheral artery disease: -Follows with Dr. Trula Slade with [facility] -Denies claudication today  6. AAA (abdominal aortic aneurysm: -3.2 cm by recent lumbar spine MRI.   -Needs repeat in 1 to 2 years  -Good BP control   Current medicines are reviewed at length with the patient today.  The patient does not have concerns regarding medicines.  The following changes have been made:  no change  Labs/ tests ordered today include: Follow with EP Monday 12/25/2018  Orders Placed This Encounter  Procedures  . EKG 12-Lead    Disposition:   FU with Dr. Rayann Heman on 12/25/2018  Signed, Kathyrn Drown, NP  11/18/2019 11:41 AM    [address] [place], [place], [place]  [address] Phone: [phone]; Fax: [phone]

## 2019-11-20 DIAGNOSIS — Z20822 Contact with and (suspected) exposure to covid-19: Secondary | ICD-10-CM | POA: Insufficient documentation

## 2019-11-20 DIAGNOSIS — Z20828 Contact with and (suspected) exposure to other viral communicable diseases: Secondary | ICD-10-CM | POA: Diagnosis not present

## 2019-11-21 ENCOUNTER — Other Ambulatory Visit: Payer: Self-pay | Admitting: Physician Assistant

## 2019-11-25 ENCOUNTER — Other Ambulatory Visit: Payer: Self-pay

## 2019-11-25 ENCOUNTER — Telehealth: Payer: Self-pay

## 2019-11-25 ENCOUNTER — Telehealth (INDEPENDENT_AMBULATORY_CARE_PROVIDER_SITE_OTHER): Payer: PPO | Admitting: Internal Medicine

## 2019-11-25 ENCOUNTER — Encounter: Payer: Self-pay | Admitting: Internal Medicine

## 2019-11-25 VITALS — Ht 65.0 in | Wt 197.0 lb

## 2019-11-25 DIAGNOSIS — I10 Essential (primary) hypertension: Secondary | ICD-10-CM | POA: Diagnosis not present

## 2019-11-25 DIAGNOSIS — I739 Peripheral vascular disease, unspecified: Secondary | ICD-10-CM | POA: Diagnosis not present

## 2019-11-25 DIAGNOSIS — I441 Atrioventricular block, second degree: Secondary | ICD-10-CM

## 2019-11-25 NOTE — Progress Notes (Signed)
Electrophysiology TeleHealth Note  Due to national recommendations of social distancing due to [place] 19, an audio telehealth visit is felt to be most appropriate for this patient at this time.  Verbal consent was obtained by me for the telehealth visit today.  The patient does not have capability for a virtual visit.  A phone visit is therefore required today.   Date:  11/25/2019   ID:  Douglas Christensen, DOB 01/15/1951, MRN [id number]  Location: patient's home  Provider location:  [facility]  Evaluation Performed: Follow-up visit  PCP:  Plotnikov, Evie Lacks, MD   Electrophysiologist:  Dr Rayann Heman  Chief Complaint:  bradycardia  History of Present Illness:    Douglas Christensen is a 68 y.o. male who presents via telehealth conferencing today.  Since last being seen in our clinic, the patient reports doing reasonably well.  he has been found to have symptomatic second degree AV block.  He has fatigue and decreased exercise tolerance.  He reports feeling "wore out".  He states that he cant walk to the mail box and back. He has chronic SOB. Today, he denies symptoms of palpitations, chest pain,  lower extremity edema, dizziness, presyncope, or syncope.  The patient is otherwise without complaint today.  The patient denies symptoms of fevers, chills, cough, or new SOB worrisome for COVID 19.  Past Medical History:  Diagnosis Date  . Anxiety   . Arthritis   . CAD (coronary artery disease)    Mild plaque (cath "years ago"); abnormal Myoview 04/2013 with subsequent CABG x 5 with LIMA to LAD, SVG to OM1, SVG to DX, SVG to PD & PL.   . Carotid artery disease ([place])    Carotid US 07/2019 bilateral ICA 40-59; bilateral subclavian stenosis  . Cataract   . GERD (gastroesophageal reflux disease)   . History of colonic polyps   . History of echocardiogram    Echo 2/19: Mild LVH, EF 60-65, normal wall motion, grade 2 diastolic dysfunction, mild RAE  . History of nuclear stress test    Myoview 2/19:  inf/inf-lat/apical inf/ant-sept defect (?diaph atten - cannot rule out peri-infarct ischemia), PVCs/PACs/Mobitz 1 // Myoview 05/2019: EF 60, inf infarct with mild peri-infarct ischemia; no significant change when compared to prior study; Intermediate Risk  . Hx of echocardiogram    Echo (9/15):  EF 55-60%; Gr 2 DD, mild BAE  . Hyperlipidemia   . Hypertension   . LBP (low back pain)   . Meralgia paresthetica of left side 2011  . PVD (peripheral vascular disease) ([place])    Stent to left common femoral and right superficial femoral.  2008.  50%  left renal   . Second degree AV block, Mobitz type I    Holter 2/19: Sinus rhythm, average heart rate 72, frequent PVCs (burden 5%), second-degree type I AV block and periods of 2:1 heart block >> continue clinical managment and avoid AVN blocking agents  . Shortness of breath    "once in awhile; can happen at anytime" (08/26/2013)  . Sleep apnea    mod OSA, central sleep apnea/hypoapnea syndrome 11/22/12, CPAP every night   . Subclavian artery stenosis ([place])    Carotid US 07/2019 bilateral ICA 40-59; bilateral subclavian stenosis  . Vitamin D deficiency     Past Surgical History:  Procedure Laterality Date  . ABDOMINAL AORTAGRAM N/A 11/16/2011   Procedure: ABDOMINAL Maxcine Ham;  Surgeon: Sherren Mocha, MD;  Location: [facility] CATH LAB;  Service: Cardiovascular;  Laterality: N/A;  .  ABDOMINAL AORTAGRAM N/A 11/14/2012   Procedure: ABDOMINAL Maxcine Ham;  Surgeon: Sherren Mocha, MD;  Location: [facility] CATH LAB;  Service: Cardiovascular;  Laterality: N/A;  . ABDOMINAL AORTAGRAM N/A 12/30/2014   Procedure: ABDOMINAL Maxcine Ham;  Surgeon: Serafina Mitchell, MD;  Location: [facility] CATH LAB;  Service: Cardiovascular;  Laterality: N/A;  . ABDOMINAL AORTOGRAM W/LOWER EXTREMITY Bilateral 08/06/2019   Procedure: ABDOMINAL AORTOGRAM W/LOWER EXTREMITY;  Surgeon: Serafina Mitchell, MD;  Location: [place] CV LAB;  Service: Cardiovascular;  Laterality: Bilateral;  . CARDIAC  CATHETERIZATION  05/02/13   x2   . CORONARY ARTERY BYPASS GRAFT N/A 05/06/2013   Procedure: CORONARY ARTERY BYPASS GRAFTING (CABG);  Surgeon: Melrose Nakayama, MD;  Location: [place];  Service: Open Heart Surgery;  Laterality: N/A;  Coronary artery bypass graft times five using left internal mammary artery and left greater saphenous vein via endovein harvest.  . ENDARTERECTOMY FEMORAL Left 01/29/2015   Procedure: ENDARTERECTOMY FEMORAL WITH PATCH ANGIOPLASTY;  Surgeon: Serafina Mitchell, MD;  Location: [place];  Service: Vascular;  Laterality: Left;  . EYE SURGERY  03/24/16   cataract surgery on left eye  . FEMORAL-POPLITEAL BYPASS GRAFT  12/27/2012   Procedure: BYPASS GRAFT FEMORAL-POPLITEAL ARTERY;  Surgeon: Serafina Mitchell, MD;  Location: [facility] OR;  Service: Vascular;  Laterality: Right;  using non-reversed sapphenous vein.  Marland Kitchen ILIAC ATHERECTOMY Left 01/29/2015   Procedure: SUPERFICIAL FEMORAL ARTERY ATHERECTOMY/PERCUTANEOUS TRANSLUMINAL ANGIOPLASTY; superficial femoral artery stent;  Surgeon: Serafina Mitchell, MD;  Location: [place];  Service: Vascular;  Laterality: Left;  . LOWER EXTREMITY ANGIOGRAM Bilateral 11/16/2011   Procedure: LOWER EXTREMITY ANGIOGRAM;  Surgeon: Sherren Mocha, MD;  Location: [facility] CATH LAB;  Service: Cardiovascular;  Laterality: Bilateral;  . lower extremity stents     bilateral lower extremities x 2  . PERCUTANEOUS STENT INTERVENTION Left 11/16/2011   Procedure: PERCUTANEOUS STENT INTERVENTION;  Surgeon: Sherren Mocha, MD;  Location: [facility] CATH LAB;  Service: Cardiovascular;  Laterality: Left;  . PERIPHERAL VASCULAR ATHERECTOMY Right 08/06/2019   Procedure: PERIPHERAL VASCULAR ATHERECTOMY;  Surgeon: Serafina Mitchell, MD;  Location: [place] CV LAB;  Service: Cardiovascular;  Laterality: Right;  Common iliac, external iliac, and popliteal.  . PERIPHERAL VASCULAR INTERVENTION Right 08/06/2019   Procedure: PERIPHERAL VASCULAR INTERVENTION;  Surgeon: Serafina Mitchell, MD;  Location: [place] CV LAB;  Service: Cardiovascular;  Laterality: Right;  common iliac, external iliac, and popliteal.  . TONSILLECTOMY    . TOTAL HIP ARTHROPLASTY Left 06/29/2015   Procedure: TOTAL HIP ARTHROPLASTY;  Surgeon: Frederik Pear, MD;  Location: [place];  Service: Orthopedics;  Laterality: Left;  LEFT TOTAL HIP ARTHROPLASTY DEPUY SROM/PINNACLE    Current Outpatient Medications  Medication Sig Dispense Refill  . amLODipine (NORVASC) 10 MG tablet Take 10 mg by mouth daily.    Marland Kitchen aspirin EC 81 MG tablet Take 1 tablet (81 mg total) by mouth daily. 90 tablet 3  . BRILINTA 90 MG TABS tablet Take 90 mg by mouth 2 (two) times daily.    . cholecalciferol (VITAMIN D) 1000 units tablet Take 1,000 Units by mouth daily.     . clindamycin (CLINDAGEL) 1 % gel APPLY TO face TWICE DAILY AS NEEDED    . clonazePAM (KLONOPIN) 0.5 MG tablet TAKE ONE TABLET TWICE DAILY AS NEEDED FOR ANXIETY 60 tablet 3  . doxycycline (VIBRA-TABS) 100 MG tablet Take 1 tablet (100 mg total) by mouth 2 (two) times daily. 14 tablet 0  . Garlic 027 MG CAPS Take 500 mg by  mouth daily.    . hydrALAZINE (APRESOLINE) 50 MG tablet Take 50 mg by mouth 3 (three) times daily.    Marland Kitchen losartan (COZAAR) 25 MG tablet Take 25 mg by mouth daily.    . potassium chloride (KLOR-CON) 8 MEQ tablet Take 1 tablet (8 mEq total) by mouth daily. 90 tablet 0  . Potassium Chloride CR (MICRO-K) 8 MEQ CPCR capsule CR Take 8 mEq by mouth daily.    . rosuvastatin (CRESTOR) 40 MG tablet Take 1 tablet (40 mg total) by mouth daily. 90 tablet 2  . tadalafil (CIALIS) 20 MG tablet Take 20 mg by mouth daily as needed for erectile dysfunction.    . Tiotropium Bromide Monohydrate (SPIRIVA RESPIMAT) 2.5 MCG/ACT AERS Inhale 2 puffs into the lungs daily. 12 g 3  . traMADol (ULTRAM) 50 MG tablet Take by mouth every 6 (six) hours as needed. Only as needed    . triamterene-hydrochlorothiazide (MAXZIDE-25) 37.5-25 MG tablet Take 2 tablets by mouth daily.    . vitamin B-12  (CYANOCOBALAMIN) 1000 MCG tablet Take 1,000 mcg by mouth daily.     No current facility-administered medications for this visit.    Allergies:   Roxicodone [oxycodone hcl], Benazepril, Gabapentin, Itraconazole, Lipitor [atorvastatin calcium], and Plavix [clopidogrel bisulfate]   Social History:  The patient  reports that he quit smoking about 6 years ago. His smoking use included cigarettes. He has a 94.00 pack-year smoking history. He has never used smokeless tobacco. He reports current alcohol use of about 4.0 standard drinks of alcohol per week. He reports that he does not use drugs.   Family History:  The patient's family history includes Alzheimer's disease in his father; Cancer in his father and mother; Heart disease in his mother; Hyperlipidemia in his mother; Hypertension in his mother.   ROS:  Please see the history of present illness.   All other systems are personally reviewed and negative.    Exam:    Vital Signs:  Ht 5\' 5"  (1.651 m)   Wt 197 lb (89.4 kg)   BMI 32.78 kg/m   Well sounding, alert and conversant   Labs/Other Tests and Data Reviewed:    Recent Labs: 06/04/2019: NT-Pro BNP 93 11/04/2019: ALT 52; BUN 53; Creatinine 1.36; Hemoglobin 11.3; Platelet Count 289; Potassium 4.3; Sodium 140   Wt Readings from Last 3 Encounters:  11/25/19 197 lb (89.4 kg)  11/18/19 197 lb (89.4 kg)  11/18/19 197 lb (89.4 kg)     ASSESSMENT & PLAN:    1.  Second degree AV block with 2:1 AV conduction The patient has symptomatic second degree AV block.  No reversible causes have been found.  I would therefore recommend PPM implant at this time. Risks, benefits, alternatives to pacemaker implantation were discussed in detail with the patient today. The patient understands that the risks include but are not limited to bleeding, infection, pneumothorax, perforation, tamponade, vascular damage, renal failure, MI, stroke, death,  and lead dislodgement and wishes to proceed. We will  therefore schedule the procedure at the next available time.  We also discussed remote monitoring and its role today. We will plan left bundle pacing for his procedure.  I have spoken with Dr Trula Slade regarding the patients antiplatelet therapy.  He has a plavix allergy and is s/p peripheral stenting in September (note reviewed).  Dr Trula Slade is ok with holding brilinta for PPM implant and resuming when able afterwards.  Stop brilinta and start ASA 81mg  daily Resume brilinta 10 days after implant and  stop ASA at that time.   2. PVD As above  3. CAD No ischemic symptoms myoview 06/04/19 reviewed and similar to prior  3. HTN Stable No change required today  Will proceed with PPM at next available time  Patient Risk:  after full review of this patients clinical status, I feel that they are at moderate risk at this time.  Today, I have spent 15 minutes with the patient with telehealth technology discussing arrhythmia management .    Army Fossa, MD  11/25/2019 12:56 PM     [place] [place] [place] [address] [phone] (office) [phone] (fax)

## 2019-11-25 NOTE — Telephone Encounter (Signed)
Phone call to pt and advised per Dr Rayann Heman, appt scheduled for PPM insertion scheduled for 12/312/2020 at 730 am.  Arrive at 530am in short stay center.  Covid test scheduled for 12/29/2020at 920am and pt states he will pick up surgical scrub and instruction letters today 11/25/2019.  Pt advised to hold Brilinta starting to today and to begin 81mg  ASA.  Pt states he already takes an 81mg  ASA daily.  Pt verbalized understanding of instructions given and agrees with plan.

## 2019-11-25 NOTE — H&P (View-Only) (Signed)
Electrophysiology TeleHealth Note  Due to national recommendations of social distancing due to [place] 19, an audio telehealth visit is felt to be most appropriate for this patient at this time.  Verbal consent was obtained by me for the telehealth visit today.  The patient does not have capability for a virtual visit.  A phone visit is therefore required today.   Date:  11/25/2019   ID:  Douglas Christensen, DOB February 04, 1951, MRN [id number]  Location: patient's home  Provider location:  [facility]  Evaluation Performed: Follow-up visit  PCP:  Plotnikov, Evie Lacks, MD   Electrophysiologist:  Dr Rayann Heman  Chief Complaint:  bradycardia  History of Present Illness:    Douglas Christensen is a 68 y.o. male who presents via telehealth conferencing today.  Since last being seen in our clinic, the patient reports doing reasonably well.  he has been found to have symptomatic second degree AV block.  He has fatigue and decreased exercise tolerance.  He reports feeling "wore out".  He states that he cant walk to the mail box and back. He has chronic SOB. Today, he denies symptoms of palpitations, chest pain,  lower extremity edema, dizziness, presyncope, or syncope.  The patient is otherwise without complaint today.  The patient denies symptoms of fevers, chills, cough, or new SOB worrisome for COVID 19.  Past Medical History:  Diagnosis Date  . Anxiety   . Arthritis   . CAD (coronary artery disease)    Mild plaque (cath "years ago"); abnormal Myoview 04/2013 with subsequent CABG x 5 with LIMA to LAD, SVG to OM1, SVG to DX, SVG to PD & PL.   . Carotid artery disease ([place])    Carotid US 07/2019 bilateral ICA 40-59; bilateral subclavian stenosis  . Cataract   . GERD (gastroesophageal reflux disease)   . History of colonic polyps   . History of echocardiogram    Echo 2/19: Mild LVH, EF 60-65, normal wall motion, grade 2 diastolic dysfunction, mild RAE  . History of nuclear stress test    Myoview 2/19:  inf/inf-lat/apical inf/ant-sept defect (?diaph atten - cannot rule out peri-infarct ischemia), PVCs/PACs/Mobitz 1 // Myoview 05/2019: EF 60, inf infarct with mild peri-infarct ischemia; no significant change when compared to prior study; Intermediate Risk  . Hx of echocardiogram    Echo (9/15):  EF 55-60%; Gr 2 DD, mild BAE  . Hyperlipidemia   . Hypertension   . LBP (low back pain)   . Meralgia paresthetica of left side 2011  . PVD (peripheral vascular disease) ([place])    Stent to left common femoral and right superficial femoral.  2008.  50%  left renal   . Second degree AV block, Mobitz type I    Holter 2/19: Sinus rhythm, average heart rate 72, frequent PVCs (burden 5%), second-degree type I AV block and periods of 2:1 heart block >> continue clinical managment and avoid AVN blocking agents  . Shortness of breath    "once in awhile; can happen at anytime" (08/26/2013)  . Sleep apnea    mod OSA, central sleep apnea/hypoapnea syndrome 11/22/12, CPAP every night   . Subclavian artery stenosis ([place])    Carotid US 07/2019 bilateral ICA 40-59; bilateral subclavian stenosis  . Vitamin D deficiency     Past Surgical History:  Procedure Laterality Date  . ABDOMINAL AORTAGRAM N/A 11/16/2011   Procedure: ABDOMINAL Maxcine Ham;  Surgeon: Sherren Mocha, MD;  Location[facility] CATH LAB;  Service: Cardiovascular;  Laterality: N/A;  .  ABDOMINAL AORTAGRAM N/A 11/14/2012   Procedure: ABDOMINAL Maxcine Ham;  Surgeon: Sherren Mocha, MD;  Location: [facility] CATH LAB;  Service: Cardiovascular;  Laterality: N/A;  . ABDOMINAL AORTAGRAM N/A 12/30/2014   Procedure: ABDOMINAL Maxcine Ham;  Surgeon: Serafina Mitchell, MD;  Location: [facility] CATH LAB;  Service: Cardiovascular;  Laterality: N/A;  . ABDOMINAL AORTOGRAM W/LOWER EXTREMITY Bilateral 08/06/2019   Procedure: ABDOMINAL AORTOGRAM W/LOWER EXTREMITY;  Surgeon: Serafina Mitchell, MD;  Location: [place] CV LAB;  Service: Cardiovascular;  Laterality: Bilateral;  . CARDIAC  CATHETERIZATION  05/02/13   x2   . CORONARY ARTERY BYPASS GRAFT N/A 05/06/2013   Procedure: CORONARY ARTERY BYPASS GRAFTING (CABG);  Surgeon: Melrose Nakayama, MD;  Location: [place];  Service: Open Heart Surgery;  Laterality: N/A;  Coronary artery bypass graft times five using left internal mammary artery and left greater saphenous vein via endovein harvest.  . ENDARTERECTOMY FEMORAL Left 01/29/2015   Procedure: ENDARTERECTOMY FEMORAL WITH PATCH ANGIOPLASTY;  Surgeon: Serafina Mitchell, MD;  Location: [place];  Service: Vascular;  Laterality: Left;  . EYE SURGERY  03/24/16   cataract surgery on left eye  . FEMORAL-POPLITEAL BYPASS GRAFT  12/27/2012   Procedure: BYPASS GRAFT FEMORAL-POPLITEAL ARTERY;  Surgeon: Serafina Mitchell, MD;  Location: [facility] OR;  Service: Vascular;  Laterality: Right;  using non-reversed sapphenous vein.  Marland Kitchen ILIAC ATHERECTOMY Left 01/29/2015   Procedure: SUPERFICIAL FEMORAL ARTERY ATHERECTOMY/PERCUTANEOUS TRANSLUMINAL ANGIOPLASTY; superficial femoral artery stent;  Surgeon: Serafina Mitchell, MD;  Location: [place];  Service: Vascular;  Laterality: Left;  . LOWER EXTREMITY ANGIOGRAM Bilateral 11/16/2011   Procedure: LOWER EXTREMITY ANGIOGRAM;  Surgeon: Sherren Mocha, MD;  Location: [facility] CATH LAB;  Service: Cardiovascular;  Laterality: Bilateral;  . lower extremity stents     bilateral lower extremities x 2  . PERCUTANEOUS STENT INTERVENTION Left 11/16/2011   Procedure: PERCUTANEOUS STENT INTERVENTION;  Surgeon: Sherren Mocha, MD;  Location: [facility] CATH LAB;  Service: Cardiovascular;  Laterality: Left;  . PERIPHERAL VASCULAR ATHERECTOMY Right 08/06/2019   Procedure: PERIPHERAL VASCULAR ATHERECTOMY;  Surgeon: Serafina Mitchell, MD;  Location: [place] CV LAB;  Service: Cardiovascular;  Laterality: Right;  Common iliac, external iliac, and popliteal.  . PERIPHERAL VASCULAR INTERVENTION Right 08/06/2019   Procedure: PERIPHERAL VASCULAR INTERVENTION;  Surgeon: Serafina Mitchell, MD;  Location: [place] CV LAB;  Service: Cardiovascular;  Laterality: Right;  common iliac, external iliac, and popliteal.  . TONSILLECTOMY    . TOTAL HIP ARTHROPLASTY Left 06/29/2015   Procedure: TOTAL HIP ARTHROPLASTY;  Surgeon: Frederik Pear, MD;  Location: [place];  Service: Orthopedics;  Laterality: Left;  LEFT TOTAL HIP ARTHROPLASTY DEPUY SROM/PINNACLE    Current Outpatient Medications  Medication Sig Dispense Refill  . amLODipine (NORVASC) 10 MG tablet Take 10 mg by mouth daily.    Marland Kitchen aspirin EC 81 MG tablet Take 1 tablet (81 mg total) by mouth daily. 90 tablet 3  . BRILINTA 90 MG TABS tablet Take 90 mg by mouth 2 (two) times daily.    . cholecalciferol (VITAMIN D) 1000 units tablet Take 1,000 Units by mouth daily.     . clindamycin (CLINDAGEL) 1 % gel APPLY TO face TWICE DAILY AS NEEDED    . clonazePAM (KLONOPIN) 0.5 MG tablet TAKE ONE TABLET TWICE DAILY AS NEEDED FOR ANXIETY 60 tablet 3  . doxycycline (VIBRA-TABS) 100 MG tablet Take 1 tablet (100 mg total) by mouth 2 (two) times daily. 14 tablet 0  . Garlic 062 MG CAPS Take 500 mg by  mouth daily.    . hydrALAZINE (APRESOLINE) 50 MG tablet Take 50 mg by mouth 3 (three) times daily.    Marland Kitchen losartan (COZAAR) 25 MG tablet Take 25 mg by mouth daily.    . potassium chloride (KLOR-CON) 8 MEQ tablet Take 1 tablet (8 mEq total) by mouth daily. 90 tablet 0  . Potassium Chloride CR (MICRO-K) 8 MEQ CPCR capsule CR Take 8 mEq by mouth daily.    . rosuvastatin (CRESTOR) 40 MG tablet Take 1 tablet (40 mg total) by mouth daily. 90 tablet 2  . tadalafil (CIALIS) 20 MG tablet Take 20 mg by mouth daily as needed for erectile dysfunction.    . Tiotropium Bromide Monohydrate (SPIRIVA RESPIMAT) 2.5 MCG/ACT AERS Inhale 2 puffs into the lungs daily. 12 g 3  . traMADol (ULTRAM) 50 MG tablet Take by mouth every 6 (six) hours as needed. Only as needed    . triamterene-hydrochlorothiazide (MAXZIDE-25) 37.5-25 MG tablet Take 2 tablets by mouth daily.    . vitamin B-12  (CYANOCOBALAMIN) 1000 MCG tablet Take 1,000 mcg by mouth daily.     No current facility-administered medications for this visit.    Allergies:   Roxicodone [oxycodone hcl], Benazepril, Gabapentin, Itraconazole, Lipitor [atorvastatin calcium], and Plavix [clopidogrel bisulfate]   Social History:  The patient  reports that he quit smoking about 6 years ago. His smoking use included cigarettes. He has a 94.00 pack-year smoking history. He has never used smokeless tobacco. He reports current alcohol use of about 4.0 standard drinks of alcohol per week. He reports that he does not use drugs.   Family History:  The patient's family history includes Alzheimer's disease in his father; Cancer in his father and mother; Heart disease in his mother; Hyperlipidemia in his mother; Hypertension in his mother.   ROS:  Please see the history of present illness.   All other systems are personally reviewed and negative.    Exam:    Vital Signs:  Ht 5\' 5"  (1.651 m)   Wt 197 lb (89.4 kg)   BMI 32.78 kg/m   Well sounding, alert and conversant   Labs/Other Tests and Data Reviewed:    Recent Labs: 06/04/2019: NT-Pro BNP 93 11/04/2019: ALT 52; BUN 53; Creatinine 1.36; Hemoglobin 11.3; Platelet Count 289; Potassium 4.3; Sodium 140   Wt Readings from Last 3 Encounters:  11/25/19 197 lb (89.4 kg)  11/18/19 197 lb (89.4 kg)  11/18/19 197 lb (89.4 kg)     ASSESSMENT & PLAN:    1.  Second degree AV block with 2:1 AV conduction The patient has symptomatic second degree AV block.  No reversible causes have been found.  I would therefore recommend PPM implant at this time. Risks, benefits, alternatives to pacemaker implantation were discussed in detail with the patient today. The patient understands that the risks include but are not limited to bleeding, infection, pneumothorax, perforation, tamponade, vascular damage, renal failure, MI, stroke, death,  and lead dislodgement and wishes to proceed. We will  therefore schedule the procedure at the next available time.  We also discussed remote monitoring and its role today. We will plan left bundle pacing for his procedure.  I have spoken with Dr Trula Slade regarding the patients antiplatelet therapy.  He has a plavix allergy and is s/p peripheral stenting in September (note reviewed).  Dr Trula Slade is ok with holding brilinta for PPM implant and resuming when able afterwards.  Stop brilinta and start ASA 81mg  daily Resume brilinta 10 days after implant and  stop ASA at that time.   2. PVD As above  3. CAD No ischemic symptoms myoview 06/04/19 reviewed and similar to prior  3. HTN Stable No change required today  Will proceed with PPM at next available time  Patient Risk:  after full review of this patients clinical status, I feel that they are at moderate risk at this time.  Today, I have spent 15 minutes with the patient with telehealth technology discussing arrhythmia management .    Army Fossa, MD  11/25/2019 12:56 PM     [place] [address] [phone] (office) [phone] (fax)

## 2019-11-26 ENCOUNTER — Other Ambulatory Visit (HOSPITAL_COMMUNITY)
Admission: RE | Admit: 2019-11-26 | Discharge: 2019-11-26 | Disposition: A | Discharge status: Home / Self Care | Payer: PPO | Source: Ambulatory Visit | Attending: Internal Medicine | Admitting: Internal Medicine

## 2019-11-26 ENCOUNTER — Other Ambulatory Visit: Payer: Self-pay | Admitting: Internal Medicine

## 2019-11-26 DIAGNOSIS — Z01812 Encounter for preprocedural laboratory examination: Secondary | ICD-10-CM | POA: Diagnosis not present

## 2019-11-26 DIAGNOSIS — Z20828 Contact with and (suspected) exposure to other viral communicable diseases: Secondary | ICD-10-CM | POA: Insufficient documentation

## 2019-11-26 LAB — SARS CORONAVIRUS 2 (TAT 6-24 HRS): SARS Coronavirus 2: NEGATIVE

## 2019-11-28 ENCOUNTER — Ambulatory Visit (HOSPITAL_COMMUNITY): Payer: PPO

## 2019-11-28 ENCOUNTER — Ambulatory Visit (HOSPITAL_COMMUNITY)
Admission: RE | Admit: 2019-11-28 | Discharge: 2019-11-28 | Disposition: A | Discharge status: Home / Self Care | Payer: PPO | Source: Ambulatory Visit | Attending: Internal Medicine | Admitting: Internal Medicine

## 2019-11-28 ENCOUNTER — Other Ambulatory Visit: Payer: Self-pay

## 2019-11-28 ENCOUNTER — Encounter (HOSPITAL_COMMUNITY)
Admission: RE | Disposition: A | Discharge status: Home / Self Care | Payer: Self-pay | Source: Ambulatory Visit | Attending: Internal Medicine

## 2019-11-28 DIAGNOSIS — R001 Bradycardia, unspecified: Secondary | ICD-10-CM | POA: Diagnosis not present

## 2019-11-28 DIAGNOSIS — Z8249 Family history of ischemic heart disease and other diseases of the circulatory system: Secondary | ICD-10-CM | POA: Insufficient documentation

## 2019-11-28 DIAGNOSIS — I10 Essential (primary) hypertension: Secondary | ICD-10-CM | POA: Insufficient documentation

## 2019-11-28 DIAGNOSIS — G4733 Obstructive sleep apnea (adult) (pediatric): Secondary | ICD-10-CM | POA: Insufficient documentation

## 2019-11-28 DIAGNOSIS — Z79899 Other long term (current) drug therapy: Secondary | ICD-10-CM | POA: Diagnosis not present

## 2019-11-28 DIAGNOSIS — I739 Peripheral vascular disease, unspecified: Secondary | ICD-10-CM | POA: Insufficient documentation

## 2019-11-28 DIAGNOSIS — I441 Atrioventricular block, second degree: Secondary | ICD-10-CM | POA: Insufficient documentation

## 2019-11-28 DIAGNOSIS — I2510 Atherosclerotic heart disease of native coronary artery without angina pectoris: Secondary | ICD-10-CM | POA: Diagnosis not present

## 2019-11-28 DIAGNOSIS — Z7902 Long term (current) use of antithrombotics/antiplatelets: Secondary | ICD-10-CM | POA: Insufficient documentation

## 2019-11-28 DIAGNOSIS — K219 Gastro-esophageal reflux disease without esophagitis: Secondary | ICD-10-CM | POA: Insufficient documentation

## 2019-11-28 DIAGNOSIS — E785 Hyperlipidemia, unspecified: Secondary | ICD-10-CM | POA: Insufficient documentation

## 2019-11-28 DIAGNOSIS — Z87891 Personal history of nicotine dependence: Secondary | ICD-10-CM | POA: Insufficient documentation

## 2019-11-28 DIAGNOSIS — Z883 Allergy status to other anti-infective agents status: Secondary | ICD-10-CM | POA: Diagnosis not present

## 2019-11-28 DIAGNOSIS — Z888 Allergy status to other drugs, medicaments and biological substances status: Secondary | ICD-10-CM | POA: Diagnosis not present

## 2019-11-28 DIAGNOSIS — Z7982 Long term (current) use of aspirin: Secondary | ICD-10-CM | POA: Insufficient documentation

## 2019-11-28 DIAGNOSIS — Z885 Allergy status to narcotic agent status: Secondary | ICD-10-CM | POA: Diagnosis not present

## 2019-11-28 DIAGNOSIS — M1990 Unspecified osteoarthritis, unspecified site: Secondary | ICD-10-CM | POA: Diagnosis not present

## 2019-11-28 DIAGNOSIS — Z951 Presence of aortocoronary bypass graft: Secondary | ICD-10-CM | POA: Diagnosis not present

## 2019-11-28 DIAGNOSIS — Z950 Presence of cardiac pacemaker: Secondary | ICD-10-CM | POA: Diagnosis not present

## 2019-11-28 HISTORY — PX: PACEMAKER IMPLANT: EP1218

## 2019-11-28 SURGERY — PACEMAKER IMPLANT

## 2019-11-28 MED ORDER — CEFAZOLIN SODIUM-DEXTROSE 2-4 GM/100ML-% IV SOLN
2.0000 g | INTRAVENOUS | Status: AC
  Administered 2019-11-28: 2 g via INTRAVENOUS
  Filled 2019-11-28: qty 100

## 2019-11-28 MED ORDER — LIDOCAINE HCL (PF) 1 % IJ SOLN
INTRAMUSCULAR | Status: AC
  Filled 2019-11-28: qty 90

## 2019-11-28 MED ORDER — BRILINTA 90 MG PO TABS: 90.0000 mg | ORAL_TABLET | Freq: Two times a day (BID) | ORAL | 0 refills | Status: DC

## 2019-11-28 MED ORDER — FENTANYL CITRATE (PF) 100 MCG/2ML IJ SOLN
INTRAMUSCULAR | Status: AC
  Filled 2019-11-28: qty 2

## 2019-11-28 MED ORDER — SODIUM CHLORIDE 0.9% FLUSH: 3.0000 mL | INTRAVENOUS | Status: DC | PRN

## 2019-11-28 MED ORDER — MIDAZOLAM HCL 5 MG/5ML IJ SOLN
INTRAMUSCULAR | Status: AC
  Filled 2019-11-28: qty 5

## 2019-11-28 MED ORDER — CHLORHEXIDINE GLUCONATE 4 % EX LIQD
4.0000 "application " | Freq: Once | CUTANEOUS | Status: DC
  Filled 2019-11-28: qty 60

## 2019-11-28 MED ORDER — ACETAMINOPHEN 325 MG PO TABS: 325.0000 mg | ORAL_TABLET | ORAL | Status: DC | PRN

## 2019-11-28 MED ORDER — MIDAZOLAM HCL 5 MG/5ML IJ SOLN
INTRAMUSCULAR | Status: DC | PRN
  Administered 2019-11-28 (×5): 1 mg via INTRAVENOUS

## 2019-11-28 MED ORDER — IOHEXOL 350 MG/ML SOLN
INTRAVENOUS | Status: DC | PRN
  Administered 2019-11-28: 15 mL via INTRAVENOUS

## 2019-11-28 MED ORDER — SODIUM CHLORIDE 0.9 % IV SOLN
80.0000 mg | INTRAVENOUS | Status: AC
  Administered 2019-11-28: 80 mg
  Filled 2019-11-28: qty 2

## 2019-11-28 MED ORDER — SODIUM CHLORIDE 0.9% FLUSH: 3.0000 mL | Freq: Two times a day (BID) | INTRAVENOUS | Status: DC

## 2019-11-28 MED ORDER — ONDANSETRON HCL 4 MG/2ML IJ SOLN: 4.0000 mg | Freq: Four times a day (QID) | INTRAMUSCULAR | Status: DC | PRN

## 2019-11-28 MED ORDER — LIDOCAINE HCL (PF) 1 % IJ SOLN
INTRAMUSCULAR | Status: DC | PRN
  Administered 2019-11-28: 60 mL

## 2019-11-28 MED ORDER — HEPARIN (PORCINE) IN NACL 1000-0.9 UT/500ML-% IV SOLN
INTRAVENOUS | Status: AC
  Filled 2019-11-28: qty 1000

## 2019-11-28 MED ORDER — CEFAZOLIN SODIUM-DEXTROSE 2-4 GM/100ML-% IV SOLN
INTRAVENOUS | Status: AC
  Filled 2019-11-28: qty 100

## 2019-11-28 MED ORDER — HEPARIN (PORCINE) IN NACL 1000-0.9 UT/500ML-% IV SOLN
INTRAVENOUS | Status: DC | PRN
  Administered 2019-11-28: 500 mL

## 2019-11-28 MED ORDER — ASPIRIN EC 81 MG PO TBEC: 81.0000 mg | DELAYED_RELEASE_TABLET | Freq: Every day | ORAL | 3 refills | Status: AC

## 2019-11-28 MED ORDER — SODIUM CHLORIDE 0.9 % IV SOLN: INTRAVENOUS | Status: DC

## 2019-11-28 MED ORDER — SODIUM CHLORIDE 0.9 % IV SOLN: 250.0000 mL | INTRAVENOUS | Status: DC | PRN

## 2019-11-28 MED ORDER — SODIUM CHLORIDE 0.9 % IV SOLN
INTRAVENOUS | Status: AC
  Filled 2019-11-28: qty 2

## 2019-11-28 MED ORDER — FENTANYL CITRATE (PF) 100 MCG/2ML IJ SOLN
INTRAMUSCULAR | Status: DC | PRN
  Administered 2019-11-28: 25 ug via INTRAVENOUS

## 2019-11-28 SURGICAL SUPPLY — 12 items
CABLE SURGICAL S-101-97-12 (CABLE) ×2 IMPLANT
CATH RIGHTSITE C315HIS02 (CATHETERS) ×2 IMPLANT
IPG PACE AZUR XT DR MRI W1DR01 (Pacemaker) ×1 IMPLANT
LEAD CAPSURE NOVUS 5076-52CM (Lead) ×2 IMPLANT
LEAD SELECT SECURE 3830 383069 (Lead) ×1 IMPLANT
PACE AZURE XT DR MRI W1DR01 (Pacemaker) ×2 IMPLANT
PAD PRO RADIOLUCENT 2001M-C (PAD) ×2 IMPLANT
SELECT SECURE 3830 383069 (Lead) ×2 IMPLANT
SHEATH 7FR PRELUDE SNAP 13 (SHEATH) ×4 IMPLANT
SLITTER 6232ADJ (MISCELLANEOUS) ×2 IMPLANT
TRAY PACEMAKER INSERTION (PACKS) ×2 IMPLANT
WIRE HI TORQ VERSACORE-J 145CM (WIRE) ×2 IMPLANT

## 2019-11-28 NOTE — Progress Notes (Signed)
Assumed care of pt from B. Orlene Plum, Therapist, sports. Assessment documented.

## 2019-11-28 NOTE — Discharge Instructions (Signed)
    Supplemental Discharge Instructions for  Pacemaker Patients  Activity No heavy lifting or vigorous activity with your left/right arm for 6 to 8 weeks.  Do not raise your left/right arm above your head for one week.  Gradually raise your affected arm as drawn below.           __          12/02/19                    12/03/19                     12/04/19                        12/05/19   NO DRIVING for 1 week    ; you may begin driving on   07/03/37  .  WOUND CARE - Keep the wound area clean and dry.  Do not get this area wet for one week. No showers for one week; you may shower on   12/05/19  . - The tape/steri-strips on your wound will fall off; do not pull them off.  No bandage is needed on the site.  DO  NOT apply any creams, oils, or ointments to the wound area. - If you notice any drainage or discharge from the wound, any swelling or bruising at the site, or you develop a fever > 101? F after you are discharged home, call the office at once.  Special Instructions - You are still able to use cellular telephones; use the ear opposite the side where you have your pacemaker/defibrillator.  Avoid carrying your cellular phone near your device. - When traveling through airports, show security personnel your identification card to avoid being screened in the metal detectors.  Ask the security personnel to use the hand wand. - Avoid arc welding equipment, TENS units (transcutaneous nerve stimulators).  Call the office for questions about other devices. - Avoid electrical appliances that are in poor condition or are not properly grounded. - Microwave ovens are safe to be near or to operate.    Continue asprin for 14 days.  Do not take brilinta for the next 14 days.  Resume brilinta 12/12/2019 and stop Asprin at that time

## 2019-11-28 NOTE — Interval H&P Note (Signed)
History and Physical Interval Note:  11/28/2019 7:33 AM  Douglas Christensen  has presented today for surgery, with the diagnosis of second degree av block.  The various methods of treatment have been discussed with the patient and family. After consideration of risks, benefits and other options for treatment, the patient has consented to  Procedure(s): PACEMAKER IMPLANT (N/A) as a surgical intervention.  The patient's history has been reviewed, patient examined, no change in status, stable for surgery.  I have reviewed the patient's chart and labs.  Questions were answered to the patient's satisfaction.     Thompson Grayer

## 2019-11-29 ENCOUNTER — Telehealth: Payer: Self-pay | Admitting: Student

## 2019-11-29 NOTE — Telephone Encounter (Signed)
    Patient called the after hours line reporting pain along her pacer site as he just underwent PPM placement yesterday. Says the area feels sore. No significant erythema and no drainage. No subjective fever or chills.   He has been taking Tylenol and he also has an Rx for Tramadol. Recommended taking this every 6 hours as needed and alternating with Tylenol. Aware of warning signs to monitor for and will call back with any acute changes in his symptoms. Will forward to the [facility] so they can follow-up with the patient on Monday to see if he needs to be examined before his scheduled appointment on 1/12.  Signed, Erma Heritage, PA-C 11/29/2019, 2:54 PM Pager: [phone]

## 2019-12-01 DIAGNOSIS — G4733 Obstructive sleep apnea (adult) (pediatric): Secondary | ICD-10-CM | POA: Diagnosis not present

## 2019-12-02 ENCOUNTER — Telehealth: Payer: Self-pay | Admitting: Emergency Medicine

## 2019-12-02 NOTE — Telephone Encounter (Signed)
Follow up due to call 11/29/19 with C/O pain and soreness at PPM insertion site. Reports that decreased pain and soreness at [facility] site  And he has no redness, drainage or edema at site. Reminded of wound check 12/10/19 @ 1000. He will contact device clinic if he has increased pain or develops any s/sx of infection.

## 2019-12-07 ENCOUNTER — Other Ambulatory Visit (HOSPITAL_COMMUNITY): Payer: PPO

## 2019-12-10 ENCOUNTER — Other Ambulatory Visit: Payer: Self-pay

## 2019-12-10 ENCOUNTER — Ambulatory Visit: Payer: PPO | Admitting: Physician Assistant

## 2019-12-10 ENCOUNTER — Ambulatory Visit (INDEPENDENT_AMBULATORY_CARE_PROVIDER_SITE_OTHER): Payer: PPO | Admitting: *Deleted

## 2019-12-10 DIAGNOSIS — R001 Bradycardia, unspecified: Secondary | ICD-10-CM

## 2019-12-10 LAB — CUP PACEART INCLINIC DEVICE CHECK
Battery Remaining Longevity: 139 mo
Battery Voltage: 3.22 V
Brady Statistic AP VP Percent: 7.89 %
Brady Statistic AP VS Percent: 0.03 %
Brady Statistic AS VP Percent: 89.56 %
Brady Statistic AS VS Percent: 2.52 %
Brady Statistic RA Percent Paced: 8 %
Brady Statistic RV Percent Paced: 97.45 %
Date Time Interrogation Session: 20210112101900
Implantable Lead Implant Date: 20201231
Implantable Lead Implant Date: 20201231
Implantable Lead Location: 753859
Implantable Lead Location: 753860
Implantable Lead Model: 3830
Implantable Lead Model: 5076
Implantable Pulse Generator Implant Date: 20201231
Lead Channel Impedance Value: 342 Ohm
Lead Channel Impedance Value: 418 Ohm
Lead Channel Impedance Value: 551 Ohm
Lead Channel Impedance Value: 551 Ohm
Lead Channel Pacing Threshold Amplitude: 0.5 V
Lead Channel Pacing Threshold Amplitude: 0.5 V
Lead Channel Pacing Threshold Pulse Width: 0.4 ms
Lead Channel Pacing Threshold Pulse Width: 0.5 ms
Lead Channel Sensing Intrinsic Amplitude: 6.25 mV
Lead Channel Setting Pacing Amplitude: 3.5 V
Lead Channel Setting Pacing Amplitude: 3.5 V
Lead Channel Setting Pacing Pulse Width: 0.5 ms
Lead Channel Setting Sensing Sensitivity: 1.2 mV

## 2019-12-10 NOTE — Progress Notes (Signed)
Wound check appointment. Steri-strips removed. Wound without redness or edema. Incision edge not approximated at medial aspect of incision site and steri - strips reapplied to wound site in that area. Dr Lovena Le aware and approved treatment . Normal device function. Thresholds, sensing, and impedances consistent with implant measurements. Device programmed at 3.5V/auto capture programmed on for extra safety margin until 3 month visit. Histogram distribution appropriate for patient and level of activity. No mode switches or high ventricular rates noted. Patient educated about wound care, arm mobility, lifting restrictions. Follow -up wound check scheduled for 12/12/19 and follow up with Dr Joylene Grapes scheduled for 03/02/20. Next remote transmission 02/27/20.

## 2019-12-10 NOTE — Progress Notes (Deleted)
Cardiology Office Note:    Date:  12/10/2019   ID:  Douglas Christensen, DOB 1951-02-16, MRN [id number]  PCP:  Cassandria Anger, MD  Cardiologist:  Sherren Mocha, MD *** Electrophysiologist:  None  Vascular Surgeon: Dr. Trula Slade Hematologist: Dr. Marlowe Kays, NP  Referring MD: Alain Marion Evie Lacks, MD   No chief complaint on file. ***  History of Present Illness:    Douglas Christensen is a 69 y.o. male with:   CAD s/p CABG in 04/2013 ? Myoview 7/20: EF 60, inf scar w/ peri-infarct ischemia; unchanged from 2019  PAD ? s/p R SFA stent and L CIA stent in 2008 ? L EIA stent 2012 ? L EIA/CFA/PFA/SFA endarterectomy ? R fem-pop 2014 ? L SFA stent in 3/16 ? s/p atherectomy and stent to R CIA, R EIA and R Pop 07/2019 ? Carotid US 07/2019: bilat subclavian stenosis  Carotid artery disease  [place] 07/2019: bilateral 40-59  2nd degree heart block ? 2-1 heart block in 06/2017 >> resolved with stoppingbeta-blocker ? Recurrent (2:1) 10/2019 >> s/p Pacemaker (Medtronic Azure XT MRI conditional dual-chamber pacemaker)  Mobitz 1  Chronic kidney disease   Hypertension   Hyperlipidemia  OSA  GERD,   prior ETOH abuse.   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  Since then, he has undergone stenting of his R CIA, R EIA and R popliteal arteries by Dr. Trula Slade.  He developed recurrent 2:1 AV block that was symptomatic and underwent PPM with Dr. Rayann Heman 11/28/2019.    The DICTATELATER SmartLink is not supported in this context. ***   Prior CV studies:   The following studies were reviewed today:  *** Carotid US 07/30/2019 Right Carotid: Velocities in the right ICA are consistent with a 40-59%                stenosis. Hemodynamically significant plaque >50% visualized in                the CCA. The ECA appears >50% stenosed. Left Carotid: Velocities in the left ICA are consistent with a 40-59% stenosis.  Non-hemodynamically significant plaque <50% noted in the CCA. The               ECA appears >50% stenosed. Vertebrals:  Right vertebral artery demonstrates antegrade flow. Left vertebral              artery demonstrates no discernable flow. Subclavians: Bilateral subclavian arteries were stenotic.  AAA [place] 07/30/2019 Abdominal Aorta: No evidence of an abdominal aortic aneurysm was visualized. The largest aortic measurement is 2.5 cm. Unable to duplicate MRI measurment of 3.2 cm. Previous diameter measurement was 3.2 cm obtained on 05/2019 MRI.   Atherosclerosis in the aorta and iliac arteries. >50% stenosis in the left external iliac artery.   Lumbar MRI 06/28/2019 IMPRESSION: - L4-L5 spondylosis and degenerative grade 1 anterolisthesis have progressed since CT myelogram 04/20/2015. Mild bilateral subarticular and central canal narrowing at this level, as well as mild left neural foraminal narrowing.  - Moderate L5-S1 facet arthrosis has also progressed. Lumbar spondylosis is otherwise unchanged. No significant canal or neural foraminal narrowing at the remaining levels.  - Enlarged suprarenal abdominal aorta measuring 3.2 cm in diameter. Recommend followup by ultrasound in 3 years. This recommendation follows ACR consensus guidelines: White Paper of the ACR Incidental Findings Committee II on Vascular Findings. J Am Coll Radiol 2013; 10:789-794. Aortic aneurysm NOS (ICD10-I71.9)  Myoview 06/04/2019  Nuclear stress EF: 60%.  There was no ST segment deviation noted during stress.  Defect 1: There is a medium defect of moderate severity present in the basal inferior, mid inferior and apical inferior location.  Findings consistent with prior myocardial infarction with peri-infarct ischemia.  This is an intermediate risk study.  The left ventricular ejection fraction is normal (55-65%).  Medium size defect in the inferior wall from apex to base, with partial reversibility from  moderate to mild, suggesting peri-infarct ischemia.  Compared to prior study, no significant change has occurred. Extracardiac uptake is also seen on today's exam and may be contributing in a similar fashion to attenuation artifact.    Holter2/13/19 1. The basic rhythm is normal sinus with an average heart rate of 72 bpm 2. There are frequent PVCs (5% burden) 3. There is second-degree type I AV block and periods of 2: 1 heart block  Echo 01/22/18 Mild LVH, EF 82-50, grade 2 diastolic dysfunction, mild RAE  Nuclear stress test 01/10/18 Inferior/inferolateral/apical inferior defect with minimal reversibility and anteroseptal wall-likely diaphragmatic attenuation; cannot rule out infarct with peri-infarct ischemia; sinus rhythm with PVCs, PACs and intermittent type I second-degree AV block which improved with increased heart rate; intermediate risk  Echo 07/2014 EF 55-60%, Gr 2 DD Mild BAE  LHC (6/14): prox and mid LM 40-50%, prox LAD 70%, ostial Dx 75%, mid LAD 90-95%, prox CFX 60-70%, mid RCA occluded, EF 55-65% >>> CABG  Carotid US (1/14): Bilateral ICA 1-39%  Carotid US (1/14): Bilateral ICA 1-39%   Past Medical History:  Diagnosis Date  . Anxiety   . Arthritis   . CAD (coronary artery disease)    Mild plaque (cath "years ago"); abnormal Myoview 04/2013 with subsequent CABG x 5 with LIMA to LAD, SVG to OM1, SVG to DX, SVG to PD & PL.   . Carotid artery disease ([place])    Carotid US 07/2019 bilateral ICA 40-59; bilateral subclavian stenosis  . Cataract   . GERD (gastroesophageal reflux disease)   . History of colonic polyps   . History of echocardiogram    Echo 2/19: Mild LVH, EF 60-65, normal wall motion, grade 2 diastolic dysfunction, mild RAE  . History of nuclear stress test    Myoview 2/19: inf/inf-lat/apical inf/ant-sept defect (?diaph atten - cannot rule out peri-infarct ischemia), PVCs/PACs/Mobitz 1 // Myoview 05/2019: EF 60, inf infarct with mild  peri-infarct ischemia; no significant change when compared to prior study; Intermediate Risk  . Hx of echocardiogram    Echo (9/15):  EF 55-60%; Gr 2 DD, mild BAE  . Hyperlipidemia   . Hypertension   . LBP (low back pain)   . Meralgia paresthetica of left side 2011  . PVD (peripheral vascular disease) ([place])    Stent to left common femoral and right superficial femoral.  2008.  50%  left renal   . Second degree AV block, Mobitz type I    Holter 2/19: Sinus rhythm, average heart rate 72, frequent PVCs (burden 5%), second-degree type I AV block and periods of 2:1 heart block >> continue clinical managment and avoid AVN blocking agents  . Shortness of breath    "once in awhile; can happen at anytime" (08/26/2013)  . Sleep apnea    mod OSA, central sleep apnea/hypoapnea syndrome 11/22/12, CPAP every night   . Subclavian artery stenosis ([place])    Carotid US 07/2019 bilateral ICA 40-59; bilateral subclavian stenosis  . Vitamin D deficiency    Surgical Hx: The patient  has  a past surgical history that includes lower extremity stents; Femoral-popliteal Bypass Graft (12/27/2012); Cardiac catheterization (05/02/13); Coronary artery bypass graft (N/A, 05/06/2013); lower extremity angiogram (Bilateral, 11/16/2011); abdominal aortagram (N/A, 11/16/2011); percutaneous stent intervention (Left, 11/16/2011); abdominal aortagram (N/A, 11/14/2012); abdominal aortagram (N/A, 12/30/2014); Endarterectomy femoral (Left, 01/29/2015); Iliac atherectomy (Left, 01/29/2015); Tonsillectomy; Total hip arthroplasty (Left, 06/29/2015); Eye surgery (03/24/16); ABDOMINAL AORTOGRAM W/LOWER EXTREMITY (Bilateral, 08/06/2019); PERIPHERAL VASCULAR ATHERECTOMY (Right, 08/06/2019); PERIPHERAL VASCULAR INTERVENTION (Right, 08/06/2019); and PACEMAKER IMPLANT (N/A, 11/28/2019).   Current Medications: No outpatient medications have been marked as taking for the 12/10/19 encounter (Appointment) with Richardson Dopp T, PA-C.     Allergies:   Roxicodone  [oxycodone hcl], Benazepril, Gabapentin, Itraconazole, Lipitor [atorvastatin calcium], and Plavix [clopidogrel bisulfate]   Social History   Tobacco Use  . Smoking status: Former Smoker    Packs/day: 2.00    Years: 47.00    Pack years: 94.00    Types: Cigarettes    Quit date: 12/17/2012    Years since quitting: 6.9  . Smokeless tobacco: Never Used  Substance Use Topics  . Alcohol use: Yes    Alcohol/week: 4.0 standard drinks    Types: 4 Shots of liquor per week    Comment: 08/26/2013 "3-4 mixed drinks/wk"  . Drug use: No     Family Hx: The patient's family history includes Alzheimer's disease in his father; Cancer in his father and mother; Heart disease in his mother; Hyperlipidemia in his mother; Hypertension in his mother. There is no history of Colon cancer, Heart attack, Esophageal cancer, Liver cancer, Rectal cancer, Stomach cancer, or Pancreatic cancer.  ROS:   Please see the history of present illness.    ROS All other systems reviewed and are negative.   EKGs/Labs/Other Test Reviewed:    EKG:  EKG is *** ordered today.  The ekg ordered today demonstrates ***  Recent Labs: 06/04/2019: NT-Pro BNP 93 11/04/2019: ALT 52; BUN 53; Creatinine 1.36; Hemoglobin 11.3; Platelet Count 289; Potassium 4.3; Sodium 140   Recent Lipid Panel Lab Results  Component Value Date/Time   CHOL 134 04/02/2019 09:56 AM   TRIG 214 (H) 04/02/2019 09:56 AM   TRIG 118 09/25/2006 10:02 AM   HDL 40 04/02/2019 09:56 AM   CHOLHDL 3.4 04/02/2019 09:56 AM   CHOLHDL 5 06/07/2018 11:21 AM   LDLCALC 51 04/02/2019 09:56 AM   LDLDIRECT 148.0 06/07/2018 11:21 AM    Physical Exam:    VS:  There were no vitals taken for this visit.    Wt Readings from Last 3 Encounters:  11/28/19 195 lb (88.5 kg)  11/25/19 197 lb (89.4 kg)  11/18/19 197 lb (89.4 kg)     ***Physical Exam  ASSESSMENT & PLAN:    *** 1. Coronary artery disease involving native coronary artery of native heart without angina  pectoris History of CABG in 2014.  Recent nuclear stress test demonstrated inferior scar with peri-infarct ischemia.  There was no significant ischemia elsewhere.  Continued medical therapy has been recommended.  Continue amlodipine, aspirin, rosuvastatin.  2. Shortness of breath EF was normal on recent nuclear stress test.  He smoked cigarettes for over 40 years.  I suspect he probably has an element of COPD.  He plans to schedule follow-up with his pulmonologist soon for sleep apnea.  I have asked him to discuss this further with Dr. Halford Chessman.  He may benefit from proceeding with pulmonary function testing.  3. Essential hypertension Blood pressure above target.  Increase losartan to 50 mg in the morning  and 25 mg in the evening.  Arrange BMET in 2 weeks.  4. Hyperlipidemia, unspecified hyperlipidemia type LDL optimal on most recent lab work.  Continue current Rx.    5. PAD (peripheral artery disease) ([place]) He notes worsening claudication in bilateral lower extremities.  His pulses are significantly diminished on the right.  It looks like his studies with vascular surgery in 05/30/2018 indicated worse disease on the right.  He is overdue for follow-up and I will make arrangements for follow-up with vascular surgery.  6. Mobitz type 1 second degree atrioventricular block He remains asymptomatic.  He is not on beta-blocker therapy secondary to this.  7. AAA (abdominal aortic aneurysm) without rupture (HCC) 3.2 cm by recent lumbar spine MRI.  We will arrange formal abdominal aortic aneurysm ultrasound.  He will likely require follow-up in 1 to 2 years.  As noted, he is not on beta-blocker secondary to second-degree AV block type I.  8. Carotid bruit, unspecified laterality R Bruit on exam.  He has not had carotid ultrasound in many years.  Carotid US will be arranged at the time of his AAA ultrasound.   Dispo:  No follow-ups on file.   Medication Adjustments/Labs and Tests  Ordered: Current medicines are reviewed at length with the patient today.  Concerns regarding medicines are outlined above.  Tests Ordered: No orders of the defined types were placed in this encounter.  Medication Changes: No orders of the defined types were placed in this encounter.   Signed, Richardson Dopp, PA-C  12/10/2019 8:24 AM    [facility] [place], [place],   [address] Phone: [phone]; Fax: [phone]

## 2019-12-10 NOTE — Patient Instructions (Signed)
Return on Thursday for follow up wound check.

## 2019-12-11 ENCOUNTER — Ambulatory Visit: Payer: PPO | Admitting: Pulmonary Disease

## 2019-12-12 ENCOUNTER — Ambulatory Visit (INDEPENDENT_AMBULATORY_CARE_PROVIDER_SITE_OTHER): Payer: PPO | Admitting: *Deleted

## 2019-12-12 ENCOUNTER — Other Ambulatory Visit: Payer: Self-pay

## 2019-12-12 DIAGNOSIS — I442 Atrioventricular block, complete: Secondary | ICD-10-CM

## 2019-12-12 NOTE — Patient Instructions (Signed)
Restart Brilanta today per medication record. Call office if you have any drainage, redness, or swelling at incision site.

## 2019-12-12 NOTE — Progress Notes (Signed)
Steri-strips removed. Wound edges well approximated. Patient to resume Brilanta and stop ASA today per medication record. Will call office with any s/sx of infection.

## 2019-12-27 DIAGNOSIS — Z20822 Contact with and (suspected) exposure to covid-19: Secondary | ICD-10-CM | POA: Diagnosis not present

## 2019-12-27 DIAGNOSIS — R000 Tachycardia, unspecified: Secondary | ICD-10-CM | POA: Diagnosis not present

## 2019-12-30 ENCOUNTER — Encounter: Payer: Self-pay | Admitting: Cardiovascular Disease

## 2019-12-30 ENCOUNTER — Ambulatory Visit: Payer: PPO | Admitting: Cardiovascular Disease

## 2019-12-30 ENCOUNTER — Other Ambulatory Visit: Payer: Self-pay

## 2019-12-30 VITALS — BP 134/68 | HR 111 | Ht 65.0 in | Wt 196.0 lb

## 2019-12-30 DIAGNOSIS — E782 Mixed hyperlipidemia: Secondary | ICD-10-CM

## 2019-12-30 DIAGNOSIS — I10 Essential (primary) hypertension: Secondary | ICD-10-CM | POA: Diagnosis not present

## 2019-12-30 DIAGNOSIS — I2510 Atherosclerotic heart disease of native coronary artery without angina pectoris: Secondary | ICD-10-CM | POA: Diagnosis not present

## 2019-12-30 DIAGNOSIS — I739 Peripheral vascular disease, unspecified: Secondary | ICD-10-CM | POA: Diagnosis not present

## 2019-12-30 DIAGNOSIS — I442 Atrioventricular block, complete: Secondary | ICD-10-CM | POA: Diagnosis not present

## 2019-12-30 MED ORDER — METOPROLOL SUCCINATE ER 25 MG PO TB24: 25.0000 mg | ORAL_TABLET | Freq: Every day | ORAL | 3 refills | Status: DC

## 2019-12-30 NOTE — Progress Notes (Signed)
Cardiology Office Note:    Date:  12/30/2019   ID:  [name], DOB 1951-08-20, MRN [id number]  PCP:  Cassandria Anger, MD  Cardiologist:  Sherren Mocha, MD  Electrophysiologist:  None   Referring MD: Cassandria Anger, MD   Chief Complaint  Patient presents with  . Coronary Artery Disease    History of Present Illness:    Douglas Christensen is a 69 y.o. male with a hx of coronary artery disease, peripheral arterial disease, heart block now status post permanent pacemaker, hypertension, and hyperlipidemia, presenting for follow-up evaluation.  The patient is here alone today.  He continued to have fatigue and bradycardia and ultimately underwent pacemaker placement about 1 month ago.  He has felt a little better since that time.  He has noted recently that his heart rate is elevated with a ventricular rate in the 100 to 110 bpm range.  He does not experience any heart palpitations.  He ran out of amlodipine about a week ago and his blood pressure has been normal even though he has not been taking it.  He denies chest pain or pressure, orthopnea, or PND.  He has had no recent problems with leg swelling.  The patient has had significant vascular disease and he is followed closely by Dr. Trula Slade.  His last endovascular intervention was in September 2020.  He is maintained on a high intensity statin drug and antiplatelet therapy with ticagrelor.   Past Medical History:  Diagnosis Date  . Anxiety   . Arthritis   . CAD (coronary artery disease)    Mild plaque (cath "years ago"); abnormal Myoview 04/2013 with subsequent CABG x 5 with LIMA to LAD, SVG to OM1, SVG to DX, SVG to PD & PL.   . Carotid artery disease ([place])    Carotid US 07/2019 bilateral ICA 40-59; bilateral subclavian stenosis  . Cataract   . GERD (gastroesophageal reflux disease)   . History of colonic polyps   . History of echocardiogram    Echo 2/19: Mild LVH, EF 60-65, normal wall motion, grade 2 diastolic dysfunction, mild  RAE  . History of nuclear stress test    Myoview 2/19: inf/inf-lat/apical inf/ant-sept defect (?diaph atten - cannot rule out peri-infarct ischemia), PVCs/PACs/Mobitz 1 // Myoview 05/2019: EF 60, inf infarct with mild peri-infarct ischemia; no significant change when compared to prior study; Intermediate Risk  . Hx of echocardiogram    Echo (9/15):  EF 55-60%; Gr 2 DD, mild BAE  . Hyperlipidemia   . Hypertension   . LBP (low back pain)   . Meralgia paresthetica of left side 2011  . PVD (peripheral vascular disease) ([place])    Stent to left common femoral and right superficial femoral.  2008.  50%  left renal   . Second degree AV block, Mobitz type I    Holter 2/19: Sinus rhythm, average heart rate 72, frequent PVCs (burden 5%), second-degree type I AV block and periods of 2:1 heart block >> continue clinical managment and avoid AVN blocking agents  . Shortness of breath    "once in awhile; can happen at anytime" (08/26/2013)  . Sleep apnea    mod OSA, central sleep apnea/hypoapnea syndrome 11/22/12, CPAP every night   . Subclavian artery stenosis ([place])    Carotid US 07/2019 bilateral ICA 40-59; bilateral subclavian stenosis  . Vitamin D deficiency     Past Surgical History:  Procedure Laterality Date  . ABDOMINAL AORTAGRAM N/A 11/16/2011   Procedure: ABDOMINAL AORTAGRAM;  Surgeon: Sherren Mocha, MD;  Location[facility] CATH LAB;  Service: Cardiovascular;  Laterality: N/A;  . ABDOMINAL AORTAGRAM N/A 11/14/2012   Procedure: ABDOMINAL Maxcine Ham;  Surgeon: Sherren Mocha, MD;  Location: [facility] CATH LAB;  Service: Cardiovascular;  Laterality: N/A;  . ABDOMINAL AORTAGRAM N/A 12/30/2014   Procedure: ABDOMINAL Maxcine Ham;  Surgeon: Serafina Mitchell, MD;  Location: [facility] CATH LAB;  Service: Cardiovascular;  Laterality: N/A;  . ABDOMINAL AORTOGRAM W/LOWER EXTREMITY Bilateral 08/06/2019   Procedure: ABDOMINAL AORTOGRAM W/LOWER EXTREMITY;  Surgeon: Serafina Mitchell, MD;  Location: [place] CV LAB;  Service:  Cardiovascular;  Laterality: Bilateral;  . CARDIAC CATHETERIZATION  05/02/13   x2   . CORONARY ARTERY BYPASS GRAFT N/A 05/06/2013   Procedure: CORONARY ARTERY BYPASS GRAFTING (CABG);  Surgeon: Melrose Nakayama, MD;  Location: [place];  Service: Open Heart Surgery;  Laterality: N/A;  Coronary artery bypass graft times five using left internal mammary artery and left greater saphenous vein via endovein harvest.  . ENDARTERECTOMY FEMORAL Left 01/29/2015   Procedure: ENDARTERECTOMY FEMORAL WITH PATCH ANGIOPLASTY;  Surgeon: Serafina Mitchell, MD;  Location: [place];  Service: Vascular;  Laterality: Left;  . EYE SURGERY  03/24/16   cataract surgery on left eye  . FEMORAL-POPLITEAL BYPASS GRAFT  12/27/2012   Procedure: BYPASS GRAFT FEMORAL-POPLITEAL ARTERY;  Surgeon: Serafina Mitchell, MD;  Location: [facility] OR;  Service: Vascular;  Laterality: Right;  using non-reversed sapphenous vein.  Marland Kitchen ILIAC ATHERECTOMY Left 01/29/2015   Procedure: SUPERFICIAL FEMORAL ARTERY ATHERECTOMY/PERCUTANEOUS TRANSLUMINAL ANGIOPLASTY; superficial femoral artery stent;  Surgeon: Serafina Mitchell, MD;  Location: [place];  Service: Vascular;  Laterality: Left;  . LOWER EXTREMITY ANGIOGRAM Bilateral 11/16/2011   Procedure: LOWER EXTREMITY ANGIOGRAM;  Surgeon: Sherren Mocha, MD;  Location: [facility] CATH LAB;  Service: Cardiovascular;  Laterality: Bilateral;  . lower extremity stents     bilateral lower extremities x 2  . PACEMAKER IMPLANT N/A 11/28/2019   Procedure: PACEMAKER IMPLANT;  Surgeon: Thompson Grayer, MD;  Location: [place] CV LAB;  Service: Cardiovascular;  Laterality: N/A;  . PERCUTANEOUS STENT INTERVENTION Left 11/16/2011   Procedure: PERCUTANEOUS STENT INTERVENTION;  Surgeon: Sherren Mocha, MD;  Location: [facility] CATH LAB;  Service: Cardiovascular;  Laterality: Left;  . PERIPHERAL VASCULAR ATHERECTOMY Right 08/06/2019   Procedure: PERIPHERAL VASCULAR ATHERECTOMY;  Surgeon: Serafina Mitchell, MD;  Location: [place] CV LAB;  Service:  Cardiovascular;  Laterality: Right;  Common iliac, external iliac, and popliteal.  . PERIPHERAL VASCULAR INTERVENTION Right 08/06/2019   Procedure: PERIPHERAL VASCULAR INTERVENTION;  Surgeon: Serafina Mitchell, MD;  Location: [place] CV LAB;  Service: Cardiovascular;  Laterality: Right;  common iliac, external iliac, and popliteal.  . TONSILLECTOMY    . TOTAL HIP ARTHROPLASTY Left 06/29/2015   Procedure: TOTAL HIP ARTHROPLASTY;  Surgeon: Frederik Pear, MD;  Location: [facility];  Service: Orthopedics;  Laterality: Left;  LEFT TOTAL HIP ARTHROPLASTY DEPUY SROM/PINNACLE    Current Medications: Current Meds  Medication Sig  . cholecalciferol (VITAMIN D) 1000 units tablet Take 1,000 Units by mouth daily.   . clindamycin (CLINDAGEL) 1 % gel Apply 1 application topically 2 (two) times daily.   . clonazePAM (KLONOPIN) 0.5 MG tablet Take 0.5 mg by mouth 2 (two) times daily as needed for anxiety.  Marland Kitchen doxycycline (VIBRA-TABS) 100 MG tablet Take 100 mg by mouth daily.  . Garlic 431 MG CAPS Take 500 mg by mouth daily.  . hydrALAZINE (APRESOLINE) 50 MG tablet Take 50 mg by mouth 3 (three) times daily.  Marland Kitchen  losartan (COZAAR) 25 MG tablet Take 25-50 mg by mouth See admin instructions. Take 2 tablets (50 mg) by mouth in the morning & take 1 tablet (25 mg) by mouth in the afternoon.  . potassium chloride (KLOR-CON) 8 MEQ tablet Take 1 tablet (8 mEq total) by mouth daily.  . rosuvastatin (CRESTOR) 40 MG tablet Take 40 mg by mouth daily.  . tadalafil (CIALIS) 20 MG tablet Take 20 mg by mouth daily as needed for erectile dysfunction.  . ticagrelor (BRILINTA) 90 MG TABS tablet Take 90 mg by mouth 2 (two) times daily.  . Tiotropium Bromide Monohydrate (SPIRIVA RESPIMAT) 2.5 MCG/ACT AERS Inhale 2 puffs into the lungs daily.  . traMADol (ULTRAM) 50 MG tablet Take 50 mg by mouth every 6 (six) hours as needed (pain).   . triamterene-hydrochlorothiazide (MAXZIDE-25) 37.5-25 MG tablet TAKE TWO TABLETS BY MOUTH EVERY DAY  .  vitamin B-12 (CYANOCOBALAMIN) 1000 MCG tablet Take 1,000 mcg by mouth daily.  . [DISCONTINUED] amLODipine (NORVASC) 10 MG tablet Take 10 mg by mouth daily.     Allergies:   Roxicodone [oxycodone hcl], Benazepril, Gabapentin, Itraconazole, Lipitor [atorvastatin calcium], and Plavix [clopidogrel bisulfate]   Social History   Socioeconomic History  . Marital status: Widowed    Spouse name: Not on file  . Number of children: 2  . Years of education: Not on file  . Highest education level: Not on file  Occupational History  . Occupation: English as a second language teacher: [employer]  Tobacco Use  . Smoking status: Former Smoker    Packs/day: 2.00    Years: 47.00    Pack years: 94.00    Types: Cigarettes    Quit date: 12/17/2012    Years since quitting: 7.0  . Smokeless tobacco: Never Used  Substance and Sexual Activity  . Alcohol use: Yes    Alcohol/week: 4.0 standard drinks    Types: 4 Shots of liquor per week    Comment: 08/26/2013 "3-4 mixed drinks/wk"  . Drug use: No  . Sexual activity: Yes  Other Topics Concern  . Not on file  Social History Narrative   Widower   Social Determinants of Health   Financial Resource Strain:   . Difficulty of Paying Living Expenses: Not on file  Food Insecurity:   . Worried About Charity fundraiser in the Last Year: Not on file  . Ran Out of Food in the Last Year: Not on file  Transportation Needs:   . Lack of Transportation (Medical): Not on file  . Lack of Transportation (Non-Medical): Not on file  Physical Activity:   . Days of Exercise per Week: Not on file  . Minutes of Exercise per Session: Not on file  Stress:   . Feeling of Stress : Not on file  Social Connections:   . Frequency of Communication with Friends and Family: Not on file  . Frequency of Social Gatherings with Friends and Family: Not on file  . Attends Religious Services: Not on file  . Active Member of Clubs or Organizations: Not on file  . Attends Archivist  Meetings: Not on file  . Marital Status: Not on file     Family History: The patient's family history includes Alzheimer's disease in his father; Cancer in his father and mother; Heart disease in his mother; Hyperlipidemia in his mother; Hypertension in his mother. There is no history of Colon cancer, Heart attack, Esophageal cancer, Liver cancer, Rectal cancer, Stomach cancer, or Pancreatic cancer.  ROS:  Please see the history of present illness.    All other systems reviewed and are negative.  EKGs/Labs/Other Studies Reviewed:    The following studies were reviewed today: Echo 01-22-2018: Study Conclusions   - Left ventricle: The cavity size was normal. Wall thickness was  increased in a pattern of mild LVH. Systolic function was normal.  The estimated ejection fraction was in the range of 60% to 65%.  Wall motion was normal; there were no regional wall motion  abnormalities. Features are consistent with a pseudonormal left  ventricular filling pattern, with concomitant abnormal relaxation  and increased filling pressure (grade 2 diastolic dysfunction).  - Right atrium: The atrium was mildly dilated.   Myoview stress test 06-04-2019: Study Highlights    Nuclear stress EF: 60%.  There was no ST segment deviation noted during stress.  Defect 1: There is a medium defect of moderate severity present in the basal inferior, mid inferior and apical inferior location.  Findings consistent with prior myocardial infarction with peri-infarct ischemia.  This is an intermediate risk study.  The left ventricular ejection fraction is normal (55-65%).   Medium size defect in the inferior wall from apex to base, with partial reversibility from moderate to mild, suggesting peri-infarct ischemia.  Compared to prior study, no significant change has occurred. Extracardiac uptake is also seen on today's exam and may be contributing in a similar fashion to attenuation artifact.      EKG:  EKG is ordered today.  The ekg ordered today demonstrates atrial sensed ventricular paced rhythm with heart rate 102 bpm.  Age-indeterminate anteroseptal MI, age-indeterminate inferior MI.  Recent Labs: 06/04/2019: NT-Pro BNP 93 11/04/2019: ALT 52; BUN 53; Creatinine 1.36; Hemoglobin 11.3; Platelet Count 289; Potassium 4.3; Sodium 140  Recent Lipid Panel    Component Value Date/Time   CHOL 134 04/02/2019 0956   TRIG 214 (H) 04/02/2019 0956   TRIG 118 09/25/2006 1002   HDL 40 04/02/2019 0956   CHOLHDL 3.4 04/02/2019 0956   CHOLHDL 5 06/07/2018 1121   VLDL 57.4 (H) 06/07/2018 1121   LDLCALC 51 04/02/2019 0956   LDLDIRECT 148.0 06/07/2018 1121    Physical Exam:    VS:  BP 134/68   Pulse (!) 111   Ht 5\' 5"  (1.651 m)   Wt 196 lb (88.9 kg)   SpO2 98%   BMI 32.62 kg/m     Wt Readings from Last 3 Encounters:  12/30/19 196 lb (88.9 kg)  11/28/19 195 lb (88.5 kg)  11/25/19 197 lb (89.4 kg)     GEN: Well nourished, well developed in no acute distress HEENT: Normal NECK: No JVD; No carotid bruits LYMPHATICS: No lymphadenopathy CARDIAC: RRR, no murmurs, rubs, gallops RESPIRATORY:  Clear to auscultation without rales, wheezing or rhonchi  ABDOMEN: Soft, non-tender, non-distended MUSCULOSKELETAL:  No edema; No deformity  SKIN: Warm and dry NEUROLOGIC:  Alert and oriented x 3 PSYCHIATRIC:  Normal affect   ASSESSMENT:    1. Coronary artery disease involving native coronary artery of native heart without angina pectoris   2. Mixed hyperlipidemia   3. Complete heart block ([place])   4. Essential hypertension   5. PAD (peripheral artery disease) ([facility])    PLAN:    In order of problems listed above:  1. The patient is stable with no angina.  He is on antiplatelet therapy with ticagrelor in the setting of his coronary and peripheral arterial disease.  He is status post surgical coronary revascularization many years ago with CABG.  He has no limitation related to angina.  His  most recent stress test from last year is reviewed. 2. Treated with a high intensity statin drug.  Last LDL cholesterol 51 mg/dL. 3. Patient is status post pacemaker placement.  He seems to be tracking an atrial rate of about 100 to 110 bpm.  Will add metoprolol succinate 25 mg daily.  Overall he seems to be doing well with respect to his recent pacemaker implantation. 4. Blood pressure is controlled on current medicines.  Will add beta-blocker as outlined above.  We will keep him off of amlodipine for now as his blood pressure seems to be in normal range.  He will continue to monitor. 5. Followed by Dr. Trula Slade.  Not currently experiencing any lifestyle limiting claudication.   Medication Adjustments/Labs and Tests Ordered: Current medicines are reviewed at length with the patient today.  Concerns regarding medicines are outlined above.  Orders Placed This Encounter  Procedures  . EKG 12-Lead   Meds ordered this encounter  Medications  . metoprolol succinate (TOPROL XL) 25 MG 24 hr tablet    Sig: Take 1 tablet (25 mg total) by mouth daily.    Dispense:  90 tablet    Refill:  3    Patient Instructions  Medication Instructions:  1) Amlodipine has been removed from your medication list.  2) START TOPROL 25 mg daily *If you need a refill on your cardiac medications before your next appointment, please call your pharmacy*  Follow-Up: At [facility], you and your health needs are our priority.  As part of our continuing mission to provide you with exceptional heart care, we have created designated Provider Care Teams.  These Care Teams include your primary Cardiologist (physician) and Advanced Practice Providers (APPs -  Physician Assistants and Nurse Practitioners) who all work together to provide you with the care you need, when you need it. Your next appointment:   12 month(s) The format for your next appointment:   In Person Provider:   You may see Sherren Mocha, MD or one of the  following Advanced Practice Providers on your designated Care Team:    Richardson Dopp, PA-C  Vin [place], PA-C  Daune Perch, [place]    Signed, Sherren Mocha, MD  12/30/2019 1:02 PM    [place]

## 2019-12-30 NOTE — Patient Instructions (Signed)
Medication Instructions:  1) Amlodipine has been removed from your medication list.  2) START TOPROL 25 mg daily *If you need a refill on your cardiac medications before your next appointment, please call your pharmacy*  Follow-Up: At [facility], you and your health needs are our priority.  As part of our continuing mission to provide you with exceptional heart care, we have created designated Provider Care Teams.  These Care Teams include your primary Cardiologist (physician) and Advanced Practice Providers (APPs -  Physician Assistants and Nurse Practitioners) who all work together to provide you with the care you need, when you need it. Your next appointment:   12 month(s) The format for your next appointment:   In Person Provider:   You may see Sherren Mocha, MD or one of the following Advanced Practice Providers on your designated Care Team:    Richardson Dopp, PA-C  Vin [place], [place]  Daune Perch, [place]

## 2020-01-01 DIAGNOSIS — G4733 Obstructive sleep apnea (adult) (pediatric): Secondary | ICD-10-CM | POA: Diagnosis not present

## 2020-01-10 ENCOUNTER — Telehealth: Payer: Self-pay | Admitting: Pulmonary Disease

## 2020-01-10 ENCOUNTER — Other Ambulatory Visit (HOSPITAL_COMMUNITY): Payer: PPO

## 2020-01-10 NOTE — Telephone Encounter (Signed)
Called spoke with patient - offered to [facility] Covid testing for tomorrow.  Patient declined d/t pending inclement weather.  Next available opening is 2/26 - pt unable to [facility] to that date as he is going out of town.  Next available opening is April >> PFT rescheduled to 4/19, Covid testing 4/16, appt with VS 03/17/20.  Patient okay with these dates, does not need an appt card mailed to him.  Nothing further needed at this time; will sign off.

## 2020-01-20 ENCOUNTER — Ambulatory Visit: Payer: PPO | Admitting: Pulmonary Disease

## 2020-01-29 DIAGNOSIS — G4733 Obstructive sleep apnea (adult) (pediatric): Secondary | ICD-10-CM | POA: Diagnosis not present

## 2020-02-03 ENCOUNTER — Inpatient Hospital Stay (HOSPITAL_BASED_OUTPATIENT_CLINIC_OR_DEPARTMENT_OTHER): Payer: PPO | Admitting: Hematology & Oncology

## 2020-02-03 ENCOUNTER — Encounter: Payer: Self-pay | Admitting: Hematology & Oncology

## 2020-02-03 ENCOUNTER — Inpatient Hospital Stay: Payer: PPO

## 2020-02-03 ENCOUNTER — Inpatient Hospital Stay: Payer: PPO | Attending: Family

## 2020-02-03 ENCOUNTER — Other Ambulatory Visit: Payer: Self-pay

## 2020-02-03 DIAGNOSIS — G4730 Sleep apnea, unspecified: Secondary | ICD-10-CM | POA: Diagnosis not present

## 2020-02-03 DIAGNOSIS — D751 Secondary polycythemia: Secondary | ICD-10-CM

## 2020-02-03 DIAGNOSIS — E538 Deficiency of other specified B group vitamins: Secondary | ICD-10-CM

## 2020-02-03 DIAGNOSIS — Z87891 Personal history of nicotine dependence: Secondary | ICD-10-CM | POA: Diagnosis not present

## 2020-02-03 DIAGNOSIS — D500 Iron deficiency anemia secondary to blood loss (chronic): Secondary | ICD-10-CM | POA: Diagnosis not present

## 2020-02-03 DIAGNOSIS — D45 Polycythemia vera: Secondary | ICD-10-CM

## 2020-02-03 HISTORY — DX: Secondary polycythemia: D75.1

## 2020-02-03 LAB — CMP (CANCER CENTER ONLY)
ALT: 23 U/L (ref 0–44)
AST: 24 U/L (ref 15–41)
Albumin: 4.7 g/dL (ref 3.5–5.0)
Alkaline Phosphatase: 57 U/L (ref 38–126)
Anion gap: 11 (ref 5–15)
BUN: 32 mg/dL — ABNORMAL HIGH (ref 8–23)
CO2: 21 mmol/L — ABNORMAL LOW (ref 22–32)
Calcium: 10.1 mg/dL (ref 8.9–10.3)
Chloride: 103 mmol/L (ref 98–111)
Creatinine: 1.42 mg/dL — ABNORMAL HIGH (ref 0.61–1.24)
GFR, Est AFR Am: 58 mL/min — ABNORMAL LOW (ref >60)
GFR, Estimated: 50 mL/min — ABNORMAL LOW (ref >60)
Glucose, Bld: 127 mg/dL — ABNORMAL HIGH (ref 70–99)
Potassium: 3.8 mmol/L (ref 3.5–5.1)
Sodium: 135 mmol/L (ref 135–145)
Total Bilirubin: 0.6 mg/dL (ref 0.3–1.2)
Total Protein: 7.4 g/dL (ref 6.5–8.1)

## 2020-02-03 LAB — CBC WITH DIFFERENTIAL (CANCER CENTER ONLY)
Abs Immature Granulocytes: 0.05 10*3/uL (ref 0.00–0.07)
Basophils Absolute: 0.1 10*3/uL (ref 0.0–0.1)
Basophils Relative: 1 %
Eosinophils Absolute: 0.2 10*3/uL (ref 0.0–0.5)
Eosinophils Relative: 2 %
HCT: 35.1 % — ABNORMAL LOW (ref 39.0–52.0)
Hemoglobin: 12.1 g/dL — ABNORMAL LOW (ref 13.0–17.0)
Immature Granulocytes: 1 %
Lymphocytes Relative: 18 %
Lymphs Abs: 1.9 10*3/uL (ref 0.7–4.0)
MCH: 32 pg (ref 26.0–34.0)
MCHC: 34.5 g/dL (ref 30.0–36.0)
MCV: 92.9 fL (ref 80.0–100.0)
Monocytes Absolute: 0.7 10*3/uL (ref 0.1–1.0)
Monocytes Relative: 7 %
Neutro Abs: 7.8 10*3/uL — ABNORMAL HIGH (ref 1.7–7.7)
Neutrophils Relative %: 71 %
Platelet Count: 228 10*3/uL (ref 150–400)
RBC: 3.78 MIL/uL — ABNORMAL LOW (ref 4.22–5.81)
RDW: 13.2 % (ref 11.5–15.5)
WBC Count: 10.8 10*3/uL — ABNORMAL HIGH (ref 4.0–10.5)
nRBC: 0 % (ref 0.0–0.2)

## 2020-02-03 NOTE — Progress Notes (Signed)
Hematology and Oncology Follow Up Visit  MIECZYSLAW STAMAS [id number] 01-Feb-1951 69 y.o. 02/03/2020   Principle Diagnosis:  Secondarypolycythemia,due to smoking/sleep apnea  Current Therapy:   Phlebotomy to keep Hct < 45% Brilinta 90 mg po BID   Interim History:  Mr. Hawker is here today for follow-up.  She is doing quite good.  We last saw him back in December.  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  He really enjoyed it.  He is going to go over to [employer] this week and work over there.  He just got back from the beach.  He got back last week actually.  He always enjoys going to the beach.  There is been no problems with respect to his and secondary polycythemia.  He is not smoked cigarettes now for 6 years.  He has had no problems with headache.  He is on Brilinta now.  I suppose this might be for peripheral vascular disease issues.  He has had no bleeding.  There is no change in bowel or bladder habits.  He has had no issues with nausea or vomiting.  Overall, his performance status is ECOG 0.    Medications:  Allergies as of 02/03/2020      Reactions   Roxicodone [oxycodone Hcl] Other (See Comments)   hallucinations   Benazepril Cough   Gabapentin Rash   Itraconazole Nausea Only, Rash   Lipitor [atorvastatin Calcium]    cramps   Plavix [clopidogrel Bisulfate] Rash      Medication List       Accurate as of February 03, 2020  1:44 PM. If you have any questions, ask your nurse or doctor.        cholecalciferol 1000 units tablet Commonly known as: VITAMIN D Take 1,000 Units by mouth daily.   clindamycin 1 % gel Commonly known as: CLINDAGEL Apply 1 application topically 2 (two) times daily.   clonazePAM 0.5 MG tablet Commonly known as: KLONOPIN Take 0.5 mg by mouth 2 (two) times daily as needed for anxiety.   doxycycline 100 MG tablet Commonly known as: VIBRA-TABS Take 100 mg by mouth daily.    Garlic 644 MG Caps Take 500 mg by mouth daily.   hydrALAZINE 50 MG tablet Commonly known as: APRESOLINE Take 50 mg by mouth 3 (three) times daily.   losartan 25 MG tablet Commonly known as: COZAAR Take 25-50 mg by mouth See admin instructions. Take 2 tablets (50 mg) by mouth in the morning & take 1 tablet (25 mg) by mouth in the afternoon.   metoprolol succinate 25 MG 24 hr tablet Commonly known as: Toprol XL Take 1 tablet (25 mg total) by mouth daily.   potassium chloride 8 MEQ tablet Commonly known as: KLOR-CON Take 1 tablet (8 mEq total) by mouth daily.   rosuvastatin 40 MG tablet Commonly known as: CRESTOR Take 40 mg by mouth daily.   Spiriva Respimat 2.5 MCG/ACT Aers Generic drug: Tiotropium Bromide Monohydrate Inhale 2 puffs into the lungs daily.   tadalafil 20 MG tablet Commonly known as: CIALIS Take 20 mg by mouth daily as needed for erectile dysfunction.   ticagrelor 90 MG Tabs tablet Commonly known as: BRILINTA Take 90 mg by mouth 2 (two) times daily.   traMADol 50 MG tablet Commonly known as: ULTRAM Take 50 mg by mouth every 6 (six) hours as needed (pain).   triamterene-hydrochlorothiazide 37.5-25 MG tablet Commonly known as: [employer]  TAKE TWO TABLETS BY MOUTH EVERY DAY   vitamin B-12 1000 MCG tablet Commonly known as: CYANOCOBALAMIN Take 1,000 mcg by mouth daily.       Allergies:  Allergies  Allergen Reactions  . Roxicodone [Oxycodone Hcl] Other (See Comments)    hallucinations  . Benazepril Cough  . Gabapentin Rash  . Itraconazole Nausea Only and Rash  . Lipitor [Atorvastatin Calcium]     cramps  . Plavix [Clopidogrel Bisulfate] Rash    Past Medical History, Surgical history, Social history, and Family History were reviewed and updated.  Review of Systems: Review of Systems  Constitutional: Negative.   HENT: Negative.   Eyes: Negative.   Respiratory: Negative.   Cardiovascular: Negative.   Gastrointestinal: Negative.     Genitourinary: Negative.   Musculoskeletal: Negative.   Skin: Negative.  Negative for rash.  Neurological: Negative.   Endo/Heme/Allergies: Negative.   Psychiatric/Behavioral: Negative.      Physical Exam:  weight is 196 lb 4 oz (89 kg). His temporal temperature is 98.7 F (37.1 C). His blood pressure is 140/61 and his pulse is 94. His respiration is 20 and oxygen saturation is 97%.   Wt Readings from Last 3 Encounters:  02/03/20 196 lb 4 oz (89 kg)  12/30/19 196 lb (88.9 kg)  11/28/19 195 lb (88.5 kg)    Physical Exam Vitals reviewed.  HENT:     Head: Normocephalic and atraumatic.  Eyes:     Pupils: Pupils are equal, round, and reactive to light.  Cardiovascular:     Rate and Rhythm: Normal rate and regular rhythm.     Heart sounds: Normal heart sounds.  Pulmonary:     Effort: Pulmonary effort is normal.     Breath sounds: Normal breath sounds.  Abdominal:     General: Bowel sounds are normal.     Palpations: Abdomen is soft.  Musculoskeletal:        General: No tenderness or deformity. Normal range of motion.     Cervical back: Normal range of motion.  Lymphadenopathy:     Cervical: No cervical adenopathy.  Skin:    General: Skin is warm and dry.     Findings: No erythema or rash.  Neurological:     Mental Status: He is alert and oriented to person, place, and time.  Psychiatric:        Behavior: Behavior normal.        Thought Content: Thought content normal.        Judgment: Judgment normal.      Lab Results  Component Value Date   WBC 10.8 (H) 02/03/2020   HGB 12.1 (L) 02/03/2020   HCT 35.1 (L) 02/03/2020   MCV 92.9 02/03/2020   PLT 228 02/03/2020   Lab Results  Component Value Date   FERRITIN 163 11/04/2019   IRON 94 11/04/2019   TIBC 321 11/04/2019   UIBC 227 11/04/2019   IRONPCTSAT 29 11/04/2019   Lab Results  Component Value Date   RETICCTPCT 2.1 (H) 07/13/2018   RBC 3.78 (L) 02/03/2020   No results found for: Nils Pyle [facility] Lab Results  Component Value Date   IGGSERUM 858 05/01/2008   IGMSERUM 134 05/01/2008   No results found for: Ronnald Ramp, A1GS, A2GS, Violet Baldy, MSPIKE, SPEI   Chemistry      Component Value Date/Time   NA 135 02/03/2020 1300   NA 142 07/30/2019 0933   K 3.8 02/03/2020 1300   CL 103 02/03/2020 1300  CO2 21 (L) 02/03/2020 1300   BUN 32 (H) 02/03/2020 1300   BUN 38 (H) 07/30/2019 0933   CREATININE 1.42 (H) 02/03/2020 1300      Component Value Date/Time   CALCIUM 10.1 02/03/2020 1300   ALKPHOS 57 02/03/2020 1300   AST 24 02/03/2020 1300   ALT 23 02/03/2020 1300   BILITOT 0.6 02/03/2020 1300       Impression and Plan: Mr. Trevathan is a very pleasant 69yo caucasian withsecondarypolycythemia.   He is done really well.  I am sure that a lot of this is because his iron is low.  He is not smoking which is always a good thing.  I think we now get him back in 5 months.  He has not been phlebotomized for quite a while.  I really think that we can get him 5 months.  I told him to make sure that he stays well-hydrated.  This is going to be incredibly crucial for his kidney function.Marland Kitchen   Volanda Napoleon, MD 3/8/20211:44 PM

## 2020-02-04 LAB — VITAMIN B12: Vitamin B-12: 769 pg/mL (ref 180–914)

## 2020-02-04 LAB — IRON AND TIBC
Iron: 91 ug/dL (ref 42–163)
Saturation Ratios: 27 % (ref 20–55)
TIBC: 335 ug/dL (ref 202–409)
UIBC: 244 ug/dL (ref 117–376)

## 2020-02-04 LAB — FERRITIN: Ferritin: 123 ng/mL (ref 24–336)

## 2020-02-22 DIAGNOSIS — G4730 Sleep apnea, unspecified: Secondary | ICD-10-CM | POA: Diagnosis not present

## 2020-02-22 DIAGNOSIS — G4733 Obstructive sleep apnea (adult) (pediatric): Secondary | ICD-10-CM | POA: Diagnosis not present

## 2020-02-24 ENCOUNTER — Other Ambulatory Visit: Payer: Self-pay

## 2020-02-24 MED ORDER — POTASSIUM CHLORIDE ER 8 MEQ PO TBCR: 8.0000 meq | EXTENDED_RELEASE_TABLET | Freq: Every day | ORAL | 3 refills | Status: DC

## 2020-02-27 ENCOUNTER — Ambulatory Visit (INDEPENDENT_AMBULATORY_CARE_PROVIDER_SITE_OTHER): Payer: PPO | Admitting: *Deleted

## 2020-02-27 DIAGNOSIS — I442 Atrioventricular block, complete: Secondary | ICD-10-CM | POA: Diagnosis not present

## 2020-02-28 ENCOUNTER — Telehealth: Payer: Self-pay

## 2020-02-28 NOTE — Telephone Encounter (Signed)
Pt called in today just to confirm that we received his transmission; I assured him that we have and the nurses will look over it and call him if they see anything

## 2020-02-29 DIAGNOSIS — G4733 Obstructive sleep apnea (adult) (pediatric): Secondary | ICD-10-CM | POA: Diagnosis not present

## 2020-03-02 ENCOUNTER — Telehealth: Payer: Self-pay

## 2020-03-02 ENCOUNTER — Telehealth (INDEPENDENT_AMBULATORY_CARE_PROVIDER_SITE_OTHER): Payer: PPO | Admitting: Internal Medicine

## 2020-03-02 ENCOUNTER — Other Ambulatory Visit: Payer: Self-pay

## 2020-03-02 ENCOUNTER — Encounter: Payer: Self-pay | Admitting: Internal Medicine

## 2020-03-02 VITALS — BP 120/73 | HR 105 | Ht 65.0 in | Wt 200.0 lb

## 2020-03-02 DIAGNOSIS — I441 Atrioventricular block, second degree: Secondary | ICD-10-CM

## 2020-03-02 DIAGNOSIS — R0602 Shortness of breath: Secondary | ICD-10-CM

## 2020-03-02 DIAGNOSIS — I10 Essential (primary) hypertension: Secondary | ICD-10-CM

## 2020-03-02 LAB — CUP PACEART REMOTE DEVICE CHECK
Battery Remaining Longevity: 118 mo
Battery Voltage: 3.18 V
Brady Statistic AP VP Percent: 14.32 %
Brady Statistic AP VS Percent: 0 %
Brady Statistic AS VP Percent: 84.6 %
Brady Statistic AS VS Percent: 1.07 %
Brady Statistic RA Percent Paced: 14.44 %
Brady Statistic RV Percent Paced: 98.92 %
Date Time Interrogation Session: 20210402111636
Implantable Lead Implant Date: 20201231
Implantable Lead Implant Date: 20201231
Implantable Lead Location: 753859
Implantable Lead Location: 753860
Implantable Lead Model: 3830
Implantable Lead Model: 5076
Implantable Pulse Generator Implant Date: 20201231
Lead Channel Impedance Value: 361 Ohm
Lead Channel Impedance Value: 437 Ohm
Lead Channel Impedance Value: 570 Ohm
Lead Channel Impedance Value: 589 Ohm
Lead Channel Pacing Threshold Amplitude: 0.75 V
Lead Channel Pacing Threshold Amplitude: 0.875 V
Lead Channel Pacing Threshold Pulse Width: 0.4 ms
Lead Channel Pacing Threshold Pulse Width: 0.4 ms
Lead Channel Sensing Intrinsic Amplitude: 6.5 mV
Lead Channel Sensing Intrinsic Amplitude: 6.5 mV
Lead Channel Sensing Intrinsic Amplitude: 7.375 mV
Lead Channel Sensing Intrinsic Amplitude: 7.375 mV
Lead Channel Setting Pacing Amplitude: 3.25 V
Lead Channel Setting Pacing Amplitude: 3.25 V
Lead Channel Setting Pacing Pulse Width: 0.4 ms
Lead Channel Setting Sensing Sensitivity: 1.2 mV

## 2020-03-02 NOTE — Telephone Encounter (Signed)
Order placed for ECHO.  Call placed to Pt to advise order entered.

## 2020-03-02 NOTE — Telephone Encounter (Signed)
-----   Message from Thompson Grayer, MD sent at 03/02/2020  2:27 PM EDT ----- Echo to evaluate SOB

## 2020-03-02 NOTE — Telephone Encounter (Signed)
Remote transmission received, pt had video visit today with Dr. Rayann Heman.  Per MD notes, needs office visit wintihin next few months to adjust output settings.

## 2020-03-02 NOTE — Progress Notes (Signed)
Electrophysiology TeleHealth Note   Due to national recommendations of social distancing due to COVID 19, an audio/video telehealth visit is felt to be most appropriate for this patient at this time.  See MyChart message from today for the patient's consent to telehealth for [facility].  Date:  03/02/2020   ID:  Douglas Christensen, DOB 1951/07/07, MRN [id number]  Location: patient's home  Provider location:  [facility]  Evaluation Performed: Follow-up visit  PCP:  Plotnikov, Evie Lacks, MD   Electrophysiologist:  Dr Rayann Heman  Chief Complaint:  palpitations  History of Present Illness:    Douglas Christensen is a 69 y.o. male who presents via telehealth conferencing today.  Since his pacemaker implant, the patient reports doing very well.  He has SOB with activity.  Not very active and has gained weight.  Today, he denies symptoms of palpitations, chest pain, lower extremity edema, dizziness, presyncope, or syncope.  The patient is otherwise without complaint today.   Past Medical History:  Diagnosis Date  . Anxiety   . Arthritis   . CAD (coronary artery disease)    Mild plaque (cath "years ago"); abnormal Myoview 04/2013 with subsequent CABG x 5 with LIMA to LAD, SVG to OM1, SVG to DX, SVG to PD & PL.   . Carotid artery disease ([place])    Carotid US 07/2019 bilateral ICA 40-59; bilateral subclavian stenosis  . Cataract   . GERD (gastroesophageal reflux disease)   . History of colonic polyps   . History of echocardiogram    Echo 2/19: Mild LVH, EF 60-65, normal wall motion, grade 2 diastolic dysfunction, mild RAE  . History of nuclear stress test    Myoview 2/19: inf/inf-lat/apical inf/ant-sept defect (?diaph atten - cannot rule out peri-infarct ischemia), PVCs/PACs/Mobitz 1 // Myoview 05/2019: EF 60, inf infarct with mild peri-infarct ischemia; no significant change when compared to prior study; Intermediate Risk  . Hx of echocardiogram    Echo (9/15):  EF 55-60%; Gr 2 DD, mild BAE  .  Hyperlipidemia   . Hypertension   . LBP (low back pain)   . Meralgia paresthetica of left side 2011  . Polycythemia, secondary 02/03/2020  . PVD (peripheral vascular disease) ([place])    Stent to left common femoral and right superficial femoral.  2008.  50%  left renal   . Second degree AV block, Mobitz type I    Holter 2/19: Sinus rhythm, average heart rate 72, frequent PVCs (burden 5%), second-degree type I AV block and periods of 2:1 heart block >> continue clinical managment and avoid AVN blocking agents  . Shortness of breath    "once in awhile; can happen at anytime" (08/26/2013)  . Sleep apnea    mod OSA, central sleep apnea/hypoapnea syndrome 11/22/12, CPAP every night   . Subclavian artery stenosis ([place])    Carotid US 07/2019 bilateral ICA 40-59; bilateral subclavian stenosis  . Vitamin D deficiency     Past Surgical History:  Procedure Laterality Date  . ABDOMINAL AORTAGRAM N/A 11/16/2011   Procedure: ABDOMINAL Maxcine Ham;  Surgeon: Sherren Mocha, MD;  Location: [facility] CATH LAB;  Service: Cardiovascular;  Laterality: N/A;  . ABDOMINAL AORTAGRAM N/A 11/14/2012   Procedure: ABDOMINAL Maxcine Ham;  Surgeon: Sherren Mocha, MD;  Location: [facility] CATH LAB;  Service: Cardiovascular;  Laterality: N/A;  . ABDOMINAL AORTAGRAM N/A 12/30/2014   Procedure: ABDOMINAL Maxcine Ham;  Surgeon: Serafina Mitchell, MD;  Location: [facility] CATH LAB;  Service: Cardiovascular;  Laterality: N/A;  . ABDOMINAL AORTOGRAM  W/LOWER EXTREMITY Bilateral 08/06/2019   Procedure: ABDOMINAL AORTOGRAM W/LOWER EXTREMITY;  Surgeon: Serafina Mitchell, MD;  Location: [place] CV LAB;  Service: Cardiovascular;  Laterality: Bilateral;  . CARDIAC CATHETERIZATION  05/02/13   x2   . CORONARY ARTERY BYPASS GRAFT N/A 05/06/2013   Procedure: CORONARY ARTERY BYPASS GRAFTING (CABG);  Surgeon: Melrose Nakayama, MD;  Location: [facility];  Service: Open Heart Surgery;  Laterality: N/A;  Coronary artery bypass graft times five using left internal mammary artery  and left greater saphenous vein via endovein harvest.  . ENDARTERECTOMY FEMORAL Left 01/29/2015   Procedure: ENDARTERECTOMY FEMORAL WITH PATCH ANGIOPLASTY;  Surgeon: Serafina Mitchell, MD;  Location: [place];  Service: Vascular;  Laterality: Left;  . EYE SURGERY  03/24/16   cataract surgery on left eye  . FEMORAL-POPLITEAL BYPASS GRAFT  12/27/2012   Procedure: BYPASS GRAFT FEMORAL-POPLITEAL ARTERY;  Surgeon: Serafina Mitchell, MD;  Location: [facility] OR;  Service: Vascular;  Laterality: Right;  using non-reversed sapphenous vein.  Marland Kitchen ILIAC ATHERECTOMY Left 01/29/2015   Procedure: SUPERFICIAL FEMORAL ARTERY ATHERECTOMY/PERCUTANEOUS TRANSLUMINAL ANGIOPLASTY; superficial femoral artery stent;  Surgeon: Serafina Mitchell, MD;  Location: [place];  Service: Vascular;  Laterality: Left;  . LOWER EXTREMITY ANGIOGRAM Bilateral 11/16/2011   Procedure: LOWER EXTREMITY ANGIOGRAM;  Surgeon: Sherren Mocha, MD;  Location: [facility] CATH LAB;  Service: Cardiovascular;  Laterality: Bilateral;  . lower extremity stents     bilateral lower extremities x 2  . PACEMAKER IMPLANT N/A 11/28/2019   Procedure: PACEMAKER IMPLANT;  Surgeon: Thompson Grayer, MD;  Location: [place] CV LAB;  Service: Cardiovascular;  Laterality: N/A;  . PERCUTANEOUS STENT INTERVENTION Left 11/16/2011   Procedure: PERCUTANEOUS STENT INTERVENTION;  Surgeon: Sherren Mocha, MD;  Location: [facility] CATH LAB;  Service: Cardiovascular;  Laterality: Left;  . PERIPHERAL VASCULAR ATHERECTOMY Right 08/06/2019   Procedure: PERIPHERAL VASCULAR ATHERECTOMY;  Surgeon: Serafina Mitchell, MD;  Location: [place] CV LAB;  Service: Cardiovascular;  Laterality: Right;  Common iliac, external iliac, and popliteal.  . PERIPHERAL VASCULAR INTERVENTION Right 08/06/2019   Procedure: PERIPHERAL VASCULAR INTERVENTION;  Surgeon: Serafina Mitchell, MD;  Location: [place] CV LAB;  Service: Cardiovascular;  Laterality: Right;  common iliac, external iliac, and popliteal.  . TONSILLECTOMY    . TOTAL HIP  ARTHROPLASTY Left 06/29/2015   Procedure: TOTAL HIP ARTHROPLASTY;  Surgeon: Frederik Pear, MD;  Location: [place];  Service: Orthopedics;  Laterality: Left;  LEFT TOTAL HIP ARTHROPLASTY DEPUY SROM/PINNACLE    Current Outpatient Medications  Medication Sig Dispense Refill  . cholecalciferol (VITAMIN D) 1000 units tablet Take 1,000 Units by mouth daily.     . clindamycin (CLINDAGEL) 1 % gel Apply 1 application topically 2 (two) times daily.     . clonazePAM (KLONOPIN) 0.5 MG tablet Take 0.5 mg by mouth 2 (two) times daily as needed for anxiety.    Marland Kitchen doxycycline (VIBRA-TABS) 100 MG tablet Take 100 mg by mouth daily.    . Garlic 884 MG CAPS Take 500 mg by mouth daily.    . hydrALAZINE (APRESOLINE) 50 MG tablet Take 50 mg by mouth 3 (three) times daily.    Marland Kitchen losartan (COZAAR) 25 MG tablet Take 25-50 mg by mouth See admin instructions. Take 2 tablets (50 mg) by mouth in the morning & take 1 tablet (25 mg) by mouth in the afternoon.    . metoprolol succinate (TOPROL XL) 25 MG 24 hr tablet Take 1 tablet (25 mg total) by mouth daily. 90 tablet 3  .  potassium chloride (KLOR-CON) 8 MEQ tablet Take 1 tablet (8 mEq total) by mouth daily. 90 tablet 3  . rosuvastatin (CRESTOR) 40 MG tablet Take 40 mg by mouth daily.    . tadalafil (CIALIS) 20 MG tablet Take 20 mg by mouth daily as needed for erectile dysfunction.    . ticagrelor (BRILINTA) 90 MG TABS tablet Take 90 mg by mouth 2 (two) times daily.    . Tiotropium Bromide Monohydrate (SPIRIVA RESPIMAT) 2.5 MCG/ACT AERS Inhale 2 puffs into the lungs daily. 12 g 3  . traMADol (ULTRAM) 50 MG tablet Take 50 mg by mouth every 6 (six) hours as needed (pain).     . triamterene-hydrochlorothiazide (MAXZIDE-25) 37.5-25 MG tablet TAKE TWO TABLETS BY MOUTH EVERY DAY 60 tablet 11  . vitamin B-12 (CYANOCOBALAMIN) 1000 MCG tablet Take 1,000 mcg by mouth daily.     No current facility-administered medications for this visit.    Allergies:   Roxicodone [oxycodone hcl],  Benazepril, Gabapentin, Itraconazole, Lipitor [atorvastatin calcium], and Plavix [clopidogrel bisulfate]   Social History:  The patient  reports that he quit smoking about 7 years ago. His smoking use included cigarettes. He has a 94.00 pack-year smoking history. He has never used smokeless tobacco. He reports current alcohol use of about 4.0 standard drinks of alcohol per week. He reports that he does not use drugs.   ROS:  Please see the history of present illness.   All other systems are personally reviewed and negative.    Exam:    Vital Signs:  BP 120/73   Pulse (!) 105   Ht 5\' 5"  (1.651 m)   Wt 200 lb (90.7 kg)   BMI 33.28 kg/m   Well sounding and appearing, alert and conversant, regular work of breathing,  good skin color Eyes- anicteric, neuro- grossly intact, skin- no apparent rash or lesions or cyanosis, mouth- oral mucosa is pink  Labs/Other Tests and Data Reviewed:    Recent Labs: 06/04/2019: NT-Pro BNP 93 02/03/2020: ALT 23; BUN 32; Creatinine 1.42; Hemoglobin 12.1; Platelet Count 228; Potassium 3.8; Sodium 135   Wt Readings from Last 3 Encounters:  03/02/20 200 lb (90.7 kg)  02/03/20 196 lb 4 oz (89 kg)  12/30/19 196 lb (88.9 kg)     Last device remote is reviewed from [facility] PDF which reveals normal device function, no arrhythmias   ASSESSMENT & PLAN:    1.  Mobitz II second degree AV block Normal pacemaker function Will need to bring in over next few months to adjust outputs to chronic settings  He feels subjectively like he has sinus tachycardia.  Atrial rates by histogram appear to be mostly under 100 bpm.  He was started on toprol by Dr Burt Knack.  No changes today  2. CAD He has SOB Followed by Dr Burt Knack Will order echo to further evaluate  3. PVD Consider brilinta as a possible cause for his SOB Echo and PFTs ordered Dr Trula Slade following  4. Obesity Body mass index is 33.28 kg/m. Lifestyle modification is encouraged  5. HTN Stable No change  required today  Follow-up:  3 months with EP NP to follow-up on echo and reprogram outputs to chronic settings, then annually   Patient Risk:  after full review of this patients clinical status, I feel that they are at moderate risk at this time.  Today, I have spent 15 minutes with the patient with telehealth technology discussing arrhythmia management .    Signed, Thompson Grayer, MD  03/02/2020 2:23 PM  [place] [address] [phone] (office) [phone] (fax)

## 2020-03-13 ENCOUNTER — Other Ambulatory Visit (HOSPITAL_COMMUNITY): Payer: PPO

## 2020-03-16 ENCOUNTER — Telehealth: Payer: Self-pay | Admitting: Pulmonary Disease

## 2020-03-16 NOTE — Telephone Encounter (Signed)
Pt's PFT appt was cancelled. Will still keep pt's scheduled OV with VS tomorrow 4/20 as we can reschedule pt's PFT and covid test for a later day.

## 2020-03-16 NOTE — Telephone Encounter (Signed)
Please verify pts appt status.  PFT follow up with sood sch for 4/20, but PFT for today was cancelled and no COV test on file.

## 2020-03-17 ENCOUNTER — Ambulatory Visit: Payer: PPO | Admitting: Pulmonary Disease

## 2020-03-24 ENCOUNTER — Other Ambulatory Visit: Payer: Self-pay | Admitting: Surgery

## 2020-03-30 DIAGNOSIS — G4733 Obstructive sleep apnea (adult) (pediatric): Secondary | ICD-10-CM | POA: Diagnosis not present

## 2020-04-01 ENCOUNTER — Ambulatory Visit (HOSPITAL_COMMUNITY): Payer: PPO | Attending: Cardiology

## 2020-04-01 ENCOUNTER — Other Ambulatory Visit: Payer: Self-pay

## 2020-04-01 DIAGNOSIS — R0602 Shortness of breath: Secondary | ICD-10-CM | POA: Diagnosis not present

## 2020-04-18 ENCOUNTER — Other Ambulatory Visit (HOSPITAL_COMMUNITY): Payer: PPO

## 2020-04-20 ENCOUNTER — Emergency Department (HOSPITAL_COMMUNITY)
Admission: EM | Admit: 2020-04-20 | Discharge: 2020-04-20 | Disposition: A | Discharge status: Home / Self Care | Payer: PPO | Attending: Emergency Medicine | Admitting: Emergency Medicine

## 2020-04-20 ENCOUNTER — Other Ambulatory Visit (HOSPITAL_COMMUNITY)
Admission: RE | Admit: 2020-04-20 | Discharge: 2020-04-20 | Disposition: A | Discharge status: Home / Self Care | Payer: PPO | Source: Ambulatory Visit | Attending: Pulmonary Disease | Admitting: Pulmonary Disease

## 2020-04-20 ENCOUNTER — Other Ambulatory Visit: Payer: Self-pay

## 2020-04-20 ENCOUNTER — Encounter (HOSPITAL_COMMUNITY): Payer: Self-pay | Admitting: Emergency Medicine

## 2020-04-20 DIAGNOSIS — Z20822 Contact with and (suspected) exposure to covid-19: Secondary | ICD-10-CM | POA: Diagnosis not present

## 2020-04-20 DIAGNOSIS — Z79899 Other long term (current) drug therapy: Secondary | ICD-10-CM | POA: Diagnosis not present

## 2020-04-20 DIAGNOSIS — I2510 Atherosclerotic heart disease of native coronary artery without angina pectoris: Secondary | ICD-10-CM | POA: Insufficient documentation

## 2020-04-20 DIAGNOSIS — J449 Chronic obstructive pulmonary disease, unspecified: Secondary | ICD-10-CM | POA: Insufficient documentation

## 2020-04-20 DIAGNOSIS — Z96642 Presence of left artificial hip joint: Secondary | ICD-10-CM | POA: Diagnosis not present

## 2020-04-20 DIAGNOSIS — Z87891 Personal history of nicotine dependence: Secondary | ICD-10-CM | POA: Diagnosis not present

## 2020-04-20 DIAGNOSIS — R3129 Other microscopic hematuria: Secondary | ICD-10-CM

## 2020-04-20 DIAGNOSIS — E876 Hypokalemia: Secondary | ICD-10-CM | POA: Insufficient documentation

## 2020-04-20 DIAGNOSIS — I10 Essential (primary) hypertension: Secondary | ICD-10-CM | POA: Insufficient documentation

## 2020-04-20 DIAGNOSIS — Z950 Presence of cardiac pacemaker: Secondary | ICD-10-CM | POA: Insufficient documentation

## 2020-04-20 DIAGNOSIS — R1031 Right lower quadrant pain: Secondary | ICD-10-CM | POA: Diagnosis present

## 2020-04-20 LAB — URINALYSIS, ROUTINE W REFLEX MICROSCOPIC
Bilirubin Urine: NEGATIVE
Glucose, UA: NEGATIVE mg/dL
Ketones, ur: NEGATIVE mg/dL
Leukocytes,Ua: NEGATIVE
Nitrite: NEGATIVE
Protein, ur: NEGATIVE mg/dL
Specific Gravity, Urine: 1.009 (ref 1.005–1.030)
pH: 5 (ref 5.0–8.0)

## 2020-04-20 LAB — COMPREHENSIVE METABOLIC PANEL
ALT: 29 U/L (ref 0–44)
AST: 28 U/L (ref 15–41)
Albumin: 4 g/dL (ref 3.5–5.0)
Alkaline Phosphatase: 45 U/L (ref 38–126)
Anion gap: 12 (ref 5–15)
BUN: 30 mg/dL — ABNORMAL HIGH (ref 8–23)
CO2: 20 mmol/L — ABNORMAL LOW (ref 22–32)
Calcium: 9.1 mg/dL (ref 8.9–10.3)
Chloride: 105 mmol/L (ref 98–111)
Creatinine, Ser: 1.87 mg/dL — ABNORMAL HIGH (ref 0.61–1.24)
GFR calc Af Amer: 42 mL/min — ABNORMAL LOW (ref >60)
GFR calc non Af Amer: 36 mL/min — ABNORMAL LOW (ref >60)
Glucose, Bld: 120 mg/dL — ABNORMAL HIGH (ref 70–99)
Potassium: 3.3 mmol/L — ABNORMAL LOW (ref 3.5–5.1)
Sodium: 137 mmol/L (ref 135–145)
Total Bilirubin: 0.4 mg/dL (ref 0.3–1.2)
Total Protein: 6.7 g/dL (ref 6.5–8.1)

## 2020-04-20 LAB — CBC
HCT: 36.2 % — ABNORMAL LOW (ref 39.0–52.0)
Hemoglobin: 12.3 g/dL — ABNORMAL LOW (ref 13.0–17.0)
MCH: 33.3 pg (ref 26.0–34.0)
MCHC: 34 g/dL (ref 30.0–36.0)
MCV: 98.1 fL (ref 80.0–100.0)
Platelets: 224 10*3/uL (ref 150–400)
RBC: 3.69 MIL/uL — ABNORMAL LOW (ref 4.22–5.81)
RDW: 13.7 % (ref 11.5–15.5)
WBC: 8.1 10*3/uL (ref 4.0–10.5)
nRBC: 0 % (ref 0.0–0.2)

## 2020-04-20 LAB — SARS CORONAVIRUS 2 (TAT 6-24 HRS): SARS Coronavirus 2: NEGATIVE

## 2020-04-20 LAB — LIPASE, BLOOD: Lipase: 31 U/L (ref 11–51)

## 2020-04-20 MED ORDER — SODIUM CHLORIDE 0.9% FLUSH: 3.0000 mL | Freq: Once | INTRAVENOUS | Status: DC

## 2020-04-20 NOTE — Discharge Instructions (Addendum)
It is overall reassuring that your pain stopped and has not recurred. There was some microscopic blood in the urine.  Follow-up with your primary doctor to have your urine retested and assure resolution. There was some increase in your creatinine, which is a measure of your kidney function.  Please have this rechecked by your primary care provider. Your potassium was mildly lower than normal.  Please see the attached information regarding this value.  Follow-up with your primary care provider for further management. Return to the emergency department should you have recurrence of abdominal pain or if you have chest pain, shortness of breath, difficulty urinating, or any other major concerns.
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
## 2020-04-20 NOTE — ED Triage Notes (Signed)
Pt in with dull RLQ pain, began last night. N/v present, still has gallbladder and appendix. States it hurts to pass urine. No cp or sob, denies any pain on palpation

## 2020-04-20 NOTE — ED Notes (Signed)
Patient was made aware that we needed urine and Urinal given.

## 2020-04-21 LAB — URINE CULTURE: Culture: <10000 — AB

## 2020-04-22 ENCOUNTER — Ambulatory Visit: Payer: PPO | Admitting: Acute Care

## 2020-04-22 ENCOUNTER — Encounter: Payer: Self-pay | Admitting: Acute Care

## 2020-04-22 ENCOUNTER — Ambulatory Visit (INDEPENDENT_AMBULATORY_CARE_PROVIDER_SITE_OTHER): Payer: PPO | Admitting: Pulmonary Disease

## 2020-04-22 ENCOUNTER — Other Ambulatory Visit: Payer: Self-pay

## 2020-04-22 VITALS — BP 132/62 | HR 85 | Temp 98.3°F | Ht 64.0 in | Wt 196.0 lb

## 2020-04-22 DIAGNOSIS — I2510 Atherosclerotic heart disease of native coronary artery without angina pectoris: Secondary | ICD-10-CM | POA: Diagnosis not present

## 2020-04-22 DIAGNOSIS — Z87891 Personal history of nicotine dependence: Secondary | ICD-10-CM | POA: Diagnosis not present

## 2020-04-22 DIAGNOSIS — Z9989 Dependence on other enabling machines and devices: Secondary | ICD-10-CM

## 2020-04-22 DIAGNOSIS — R0609 Other forms of dyspnea: Secondary | ICD-10-CM

## 2020-04-22 DIAGNOSIS — R0600 Dyspnea, unspecified: Secondary | ICD-10-CM

## 2020-04-22 DIAGNOSIS — G4733 Obstructive sleep apnea (adult) (pediatric): Secondary | ICD-10-CM

## 2020-04-22 LAB — PULMONARY FUNCTION TEST
DL/VA % pred: 112 %
DL/VA: 4.68 ml/min/mmHg/L
DLCO cor % pred: 86 %
DLCO cor: 18.52 ml/min/mmHg
DLCO unc % pred: 79 %
DLCO unc: 17.19 ml/min/mmHg
FEF 25-75 Post: 2.39 L/sec
FEF 25-75 Pre: 2.13 L/sec
FEF2575-%Change-Post: 11 %
FEF2575-%Pred-Post: 120 %
FEF2575-%Pred-Pre: 107 %
FEV1-%Change-Post: 2 %
FEV1-%Pred-Post: 84 %
FEV1-%Pred-Pre: 81 %
FEV1-Post: 2.16 L
FEV1-Pre: 2.1 L
FEV1FVC-%Change-Post: 5 %
FEV1FVC-%Pred-Pre: 108 %
FEV6-%Change-Post: -1 %
FEV6-%Pred-Post: 78 %
FEV6-%Pred-Pre: 79 %
FEV6-Post: 2.56 L
FEV6-Pre: 2.6 L
FEV6FVC-%Pred-Post: 106 %
FEV6FVC-%Pred-Pre: 106 %
FVC-%Change-Post: -2 %
FVC-%Pred-Post: 73 %
FVC-%Pred-Pre: 75 %
FVC-Post: 2.57 L
FVC-Pre: 2.63 L
Post FEV1/FVC ratio: 84 %
Post FEV6/FVC ratio: 100 %
Pre FEV1/FVC ratio: 80 %
Pre FEV6/FVC Ratio: 100 %
RV % pred: 95 %
RV: 2 L
TLC % pred: 79 %
TLC: 4.64 L

## 2020-04-22 NOTE — Progress Notes (Signed)
Full PFT performed today. °

## 2020-04-22 NOTE — Patient Instructions (Addendum)
It was good to see you today. Your PFT's show some decrease in function since 2014 when they were last checked, but no significant  obstruction.  The changes we see could be related to weight gain over the last 7 years. ( 26 pounds) If you do not feel Spiriva is helping at all, it is ok to stop. Re-start it if you do find it is actually helping once you have been off the medication for several days. We will place a referral to Dietitian for weight loss. We will place a referral to Pulmonary Rehab Follow up with Dr. Nechama Guard re: changing Brilinta to Plavix Follow up with Dr. Halford Chessman in 3 months  Continue BiPAP at bedtime Please contact office for sooner follow up if symptoms do not improve or worsen or seek emergency care

## 2020-04-22 NOTE — Progress Notes (Addendum)
History of Present Illness 69 year old male former smoker (2014) followed for complex sleep apnea. Medical history significant for polycythemia , CAD, PAD. He is followed by Dr. Halford Chessman.  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  Somewhat Sedentary  Lifestyle.  Mows grass -riding lawnmower.   Walked today in the office . Sats were 99-100%  04/26/2020  Pt. Presents for follow up of sleep apnea and dyspnea on exertion. He was last seen by Rexene Edison NP 09/2019. At that time her was started on Spiriva. He does not feel this has helped his dyspnea at all. He states he cannot see a difference with Spiriva use. He continues to endorse dyspnea on exertion. He has seen cardiology, had a echo and has been told there is no indication of a cardiac etiology  to his dyspnea. He is on Brilinta . It is possible this could be the cause of his shortness of breath. He states he had a stent placed  About a year ago. He has called his vascular surgeon Kennedy Bucker) and is going to ask if he can be converted over to Plavix to see if there is any improvement in his dyspnea. Other option is to try a LABA/  ICS as with increased DLCO/VA patient could have asthmatic component to his PF. Pt. Has Body mass index is 33.64 kg/m., so I favor obesity as a factor in patient's dyspnea.  . .   Test Results: PFT 04/22/2020          PFT 02/28/13 >> FEV1 1.91 (75%), FEV1% 75, TLC 4.71 (95%), DLCO 82%, no BD  Sleep tests:  HST 11/23/12>>AHI 15.8, SpO2 low 83%, both obstructive and central events BiPAP 02/21/14>>BiPAP 17/13 cm H2O >>AHI 0, + R. Centrals with lower pressures. BiPAP 09/20/14 to 10/19/14>>used on 30 of 30 nights with average 6 hrs 9 min. Average AHI 3.3 with BiPAP 14/10 cm H2O  Echo 01/22/18 >> EF 60 to 65%, mild LVH, grade 2 DD  Stress Myoview July 2020 EF 60%, no ST changes during stress portion.  Medium sized defect noted was recommended continue on current medications.  Echo  04/01/2020 Left ventricular ejection fraction, by estimation, is 55 to 60%. The left ventricle has normal function. The left ventricle has no regional wall motion abnormalities. There is moderate concentric left ventricular hypertrophy. Left ventricular diastolic parameters are indeterminate. Right ventricular systolic function is normal. The right ventricular size is normal. The mitral valve is normal in structure. Trivial mitral valve regurgitation. No evidence of mitral stenosis. The aortic valve is tricuspid. Aortic valve regurgitation is not visualized. Mild aortic valve sclerosis is present, with no evidence of aortic valve stenosis. Aortic dilatation noted. There is mild dilatation of the ascending aorta measuring 38 mm. The inferior vena cava is normal in size.  CBC Latest Ref Rng & Units 04/20/2020 02/03/2020 11/04/2019  WBC 4.0 - 10.5 K/uL 8.1 10.8(H) 11.4(H)  Hemoglobin 13.0 - 17.0 g/dL 12.3(L) 12.1(L) 11.3(L)  Hematocrit 39.0 - 52.0 % 36.2(L) 35.1(L) 33.2(L)  Platelets 150 - 400 K/uL 224 228 289    BMP Latest Ref Rng & Units 04/20/2020 02/03/2020 11/04/2019  Glucose 70 - 99 mg/dL 120(H) 127(H) 160(H)  BUN 8 - 23 mg/dL 30(H) 32(H) 53(H)  Creatinine 0.61 - 1.24 mg/dL 1.87(H) 1.42(H) 1.36(H)  BUN/Creat Ratio 10 - 24 - - -  Sodium 135 - 145 mmol/L 137 135 140  Potassium 3.5 - 5.1 mmol/L 3.3(L) 3.8 4.3  Chloride 98 - 111 mmol/L 105 103 109  CO2 22 - 32 mmol/L 20(L) 21(L) 21(L)  Calcium 8.9 - 10.3 mg/dL 9.1 10.1 9.6    BNP    Component Value Date/Time   BNP 97.5 04/25/2016 1819    ProBNP    Component Value Date/Time   PROBNP 93 06/04/2019 1046    PFT    Component Value Date/Time   FEV1PRE 2.10 04/22/2020 1449   FEV1POST 2.16 04/22/2020 1449   FVCPRE 2.63 04/22/2020 1449   FVCPOST 2.57 04/22/2020 1449   TLC 4.64 04/22/2020 1449   DLCOUNC 17.19 04/22/2020 1449   PREFEV1FVCRT 80 04/22/2020 1449   PSTFEV1FVCRT 84 04/22/2020 1449    ECHOCARDIOGRAM COMPLETE  Result  Date: 04/01/2020    ECHOCARDIOGRAM REPORT   Patient Name:   PEPE MINEAU   Date of Exam: 04/01/2020 Medical Rec #:  [id number]     Height:       65.0 in Accession #:    [id number]    Weight:       200.0 lb Date of Birth:  03/26/1951      BSA:          1.978 m Patient Age:    34 years      BP:           140/61 mmHg Patient Gender: M             HR:           74 bpm. Exam Location:  [facility] Procedure: 2D Echo, Color Doppler and Cardiac Doppler Indications:    R06.00 Dyspnea  History:        Patient has prior history of Echocardiogram examinations, most                 recent 01/22/2018. CAD, Prior CABG; Risk Factors:Hypertension and                 Dyslipidemia. Mobitz Type II atrioventricular block. PVD. Low                 back pain. Obstructive sleep apnea (CPAP).  Sonographer:    Diamond Nickel RCS Referring Phys: Derma  Sonographer Comments: Image acquisition challenging due to patient body habitus. IMPRESSIONS  1. Left ventricular ejection fraction, by estimation, is 55 to 60%. The left ventricle has normal function. The left ventricle has no regional wall motion abnormalities. There is moderate concentric left ventricular hypertrophy. Left ventricular diastolic parameters are indeterminate.  2. Right ventricular systolic function is normal. The right ventricular size is normal.  3. The mitral valve is normal in structure. Trivial mitral valve regurgitation. No evidence of mitral stenosis.  4. The aortic valve is tricuspid. Aortic valve regurgitation is not visualized. Mild aortic valve sclerosis is present, with no evidence of aortic valve stenosis.  5. Aortic dilatation noted. There is mild dilatation of the ascending aorta measuring 38 mm.  6. The inferior vena cava is normal in size with greater than 50% respiratory variability, suggesting right atrial pressure of 3 mmHg. FINDINGS  Left Ventricle: Left ventricular ejection fraction, by estimation, is 55 to 60%. The left ventricle has normal  function. The left ventricle has no regional wall motion abnormalities. The left ventricular internal cavity size was normal in size. There is  moderate concentric left ventricular hypertrophy. Left ventricular diastolic parameters are indeterminate. Right Ventricle: The right ventricular size is normal. No increase in right ventricular wall thickness. Right ventricular systolic function is normal. Left Atrium: Left atrial size was normal in size.  Right Atrium: Right atrial size was normal in size. Pericardium: A small pericardial effusion is present. Presence of pericardial fat pad. Mitral Valve: The mitral valve is normal in structure. Trivial mitral valve regurgitation. No evidence of mitral valve stenosis. Tricuspid Valve: The tricuspid valve is normal in structure. Tricuspid valve regurgitation is trivial. No evidence of tricuspid stenosis. Aortic Valve: The aortic valve is tricuspid. Aortic valve regurgitation is not visualized. Mild aortic valve sclerosis is present, with no evidence of aortic valve stenosis. There is mild calcification of the aortic valve. Pulmonic Valve: The pulmonic valve was not well visualized. Pulmonic valve regurgitation is trivial. No evidence of pulmonic stenosis. Aorta: Aortic dilatation noted. There is mild dilatation of the ascending aorta measuring 38 mm. Venous: The inferior vena cava is normal in size with greater than 50% respiratory variability, suggesting right atrial pressure of 3 mmHg. IAS/Shunts: No atrial level shunt detected by color flow Doppler.  LEFT VENTRICLE PLAX 2D LVIDd:         3.80 cm  Diastology LVIDs:         2.90 cm  LV e' lateral:   5.55 cm/s LV PW:         1.50 cm  LV E/e' lateral: 16.3 LV IVS:        1.80 cm  LV e' medial:    6.74 cm/s LVOT diam:     2.55 cm  LV E/e' medial:  13.4 LV SV:         113 LV SV Index:   57 LVOT Area:     5.11 cm  RIGHT VENTRICLE RV Basal diam:  1.90 cm RV S prime:     10.40 cm/s TAPSE (M-mode): 1.6 cm LEFT ATRIUM              Index       RIGHT ATRIUM           Index LA diam:        3.80 cm 1.92 cm/m  RA Area:     10.30 cm LA Vol (A2C):   33.6 ml 16.99 ml/m RA Volume:   22.10 ml  11.17 ml/m LA Vol (A4C):   44.6 ml 22.55 ml/m LA Biplane Vol: 39.7 ml 20.07 ml/m  AORTIC VALVE LVOT Vmax:   124.00 cm/s LVOT Vmean:  69.000 cm/s LVOT VTI:    0.222 m  AORTA Ao Root diam: 3.30 cm MITRAL VALVE MV Area (PHT): 3.72 cm    SHUNTS MV Decel Time: 204 msec    Systemic VTI:  0.22 m MV E velocity: 90.30 cm/s  Systemic Diam: 2.55 cm MV A velocity: 87.50 cm/s MV E/A ratio:  1.03 Buford Dresser MD Electronically signed by Buford Dresser MD Signature Date/Time: 04/01/2020/2:38:35 PM    Final      Past medical hx Past Medical History:  Diagnosis Date   Anxiety    Arthritis    CAD (coronary artery disease)    Mild plaque (cath "years ago"); abnormal Myoview 04/2013 with subsequent CABG x 5 with LIMA to LAD, SVG to OM1, SVG to DX, SVG to PD & PL.    Carotid artery disease ([place])    Carotid US 07/2019 bilateral ICA 40-59; bilateral subclavian stenosis   Cataract    GERD (gastroesophageal reflux disease)    History of colonic polyps    History of echocardiogram    Echo 2/19: Mild LVH, EF 60-65, normal wall motion, grade 2 diastolic dysfunction, mild RAE   History of nuclear stress test  Myoview 2/19: inf/inf-lat/apical inf/ant-sept defect (?diaph atten - cannot rule out peri-infarct ischemia), PVCs/PACs/Mobitz 1 // Myoview 05/2019: EF 60, inf infarct with mild peri-infarct ischemia; no significant change when compared to prior study; Intermediate Risk   Hx of echocardiogram    Echo (9/15):  EF 55-60%; Gr 2 DD, mild BAE   Hyperlipidemia    Hypertension    LBP (low back pain)    Meralgia paresthetica of left side 2011   Polycythemia, secondary 02/03/2020   PVD (peripheral vascular disease) ([place])    Stent to left common femoral and right superficial femoral.  2008.  50%  left renal    Second degree AV block,  Mobitz type I    Holter 2/19: Sinus rhythm, average heart rate 72, frequent PVCs (burden 5%), second-degree type I AV block and periods of 2:1 heart block >> continue clinical managment and avoid AVN blocking agents   Shortness of breath    "once in awhile; can happen at anytime" (08/26/2013)   Sleep apnea    mod OSA, central sleep apnea/hypoapnea syndrome 11/22/12, CPAP every night    Subclavian artery stenosis ([place])    Carotid US 07/2019 bilateral ICA 40-59; bilateral subclavian stenosis   Vitamin D deficiency      Social History   Tobacco Use   Smoking status: Former Smoker    Packs/day: 2.00    Years: 47.00    Pack years: 94.00    Types: Cigarettes    Quit date: 12/17/2012    Years since quitting: 7.3   Smokeless tobacco: Never Used  Substance Use Topics   Alcohol use: Yes    Alcohol/week: 4.0 standard drinks    Types: 4 Shots of liquor per week    Comment: 08/26/2013 "3-4 mixed drinks/wk"   Drug use: No    Mr.Matkins reports that he quit smoking about 7 years ago. His smoking use included cigarettes. He has a 94.00 pack-year smoking history. He has never used smokeless tobacco. He reports current alcohol use of about 4.0 standard drinks of alcohol per week. He reports that he does not use drugs.  Tobacco Cessation: Former smoker Quit 2014, with a 94 pack year smoking history  Past surgical hx, Family hx, Social hx all reviewed.  Current Outpatient Medications on File Prior to Visit  Medication Sig   acetaminophen (TYLENOL) 650 MG CR tablet Take 1,300 mg by mouth every 8 (eight) hours as needed for pain.   BRILINTA 90 MG TABS tablet TAKE ONE TABLET BY MOUTH TWICE DAILY (Patient taking differently: Take 90 mg by mouth 2 (two) times daily. )   cholecalciferol (VITAMIN D) 1000 units tablet Take 1,000 Units by mouth daily.    clindamycin (CLINDAGEL) 1 % gel Apply 1 application topically 2 (two) times daily.    clonazePAM (KLONOPIN) 0.5 MG tablet Take 0.5 mg by mouth  2 (two) times daily as needed for anxiety.   hydrALAZINE (APRESOLINE) 50 MG tablet Take 50 mg by mouth 3 (three) times daily.   losartan (COZAAR) 25 MG tablet Take 50 mg by mouth daily.    metoprolol succinate (TOPROL XL) 25 MG 24 hr tablet Take 1 tablet (25 mg total) by mouth daily.   potassium chloride (KLOR-CON) 8 MEQ tablet Take 1 tablet (8 mEq total) by mouth daily.   rosuvastatin (CRESTOR) 40 MG tablet Take 40 mg by mouth daily.   tadalafil (CIALIS) 20 MG tablet Take 20 mg by mouth daily as needed for erectile dysfunction.   Tiotropium Bromide Monohydrate (SPIRIVA  RESPIMAT) 2.5 MCG/ACT AERS Inhale 2 puffs into the lungs daily.   traMADol (ULTRAM) 50 MG tablet Take 50 mg by mouth every 6 (six) hours as needed (pain).    triamterene-hydrochlorothiazide (MAXZIDE-25) 37.5-25 MG tablet TAKE TWO TABLETS BY MOUTH EVERY DAY (Patient taking differently: Take 2 tablets by mouth daily. )   vitamin B-12 (CYANOCOBALAMIN) 1000 MCG tablet Take 1,000 mcg by mouth daily.   No current facility-administered medications on file prior to visit.     Allergies  Allergen Reactions   Roxicodone [Oxycodone Hcl] Other (See Comments)    hallucinations   Benazepril Cough   Gabapentin Rash   Itraconazole Nausea Only and Rash   Lipitor [Atorvastatin Calcium]     cramps   Plavix [Clopidogrel Bisulfate] Rash    Review Of Systems:  Constitutional:   No  weight loss, night sweats,  Fevers, chills, fatigue, or  lassitude.  HEENT:   No headaches,  Difficulty swallowing,  Tooth/dental problems, or  Sore throat,                No sneezing, itching, ear ache, nasal congestion, post nasal drip,   CV:  No chest pain,  Orthopnea, PND, swelling in lower extremities, anasarca, dizziness, palpitations, syncope.   GI  No heartburn, indigestion, abdominal pain, nausea, vomiting, diarrhea, change in bowel habits, loss of appetite, bloody stools.   Resp: +  shortness of breath with exertion less at rest.   No excess mucus, no productive cough,  No non-productive cough,  No coughing up of blood.  No change in color of mucus.  No wheezing.  No chest wall deformity  Skin: no rash or lesions.  GU: no dysuria, change in color of urine, no urgency or frequency.  No flank pain, no hematuria   MS:  No joint pain or swelling.  No decreased range of motion.  No back pain.  Psych:  No change in mood or affect. No depression or anxiety.  No memory loss.   Vital Signs BP 132/62 (BP Location: Left Arm, Cuff Size: Normal)    Pulse 85    Temp 98.3 F (36.8 C) (Oral)    Ht 5\' 4"  (1.626 m)    Wt 196 lb (88.9 kg)    SpO2 94%    BMI 33.64 kg/m    Physical Exam:  General- No distress,  A&Ox3, pleasant ENT: No sinus tenderness, TM clear, pale nasal mucosa, no oral exudate,no post nasal drip, no LAN Cardiac: S1, S2, regular rate and rhythm, no murmur Chest: No wheeze/ rales/ dullness; no accessory muscle use, no nasal flaring, no sternal retractions, diminished per bases Abd.: Soft Non-tender, ND, BS + Ext: No clubbing cyanosis, edema Neuro:  normal strength, MAE x 4, A&O x 3 Skin: No rashes, warm and dry Psych: normal mood and behavior   Assessment/Plan  Dyspnea with exertion Progressive dyspnea with activity over the last year suspect is multifactorial with multiple comorbidities , suspected deconditioning. Significant smoking history 94 pack years, Quit 2014 Plan Your PFT's show some decrease in function since 2014 when they were last checked, but no significant  obstruction.  The changes we see could be related to weight gain over the last 7 years. ( 26 pounds) There may be an element of restriction caused by your weight and physical deconditioning. If you do not feel Spiriva is helping at all, it is ok to stop. Re-start it if you do find it is actually helping once you have been off the medication  for several days. We will place a referral to Dietitian for weight loss. We will place a referral  to Pulmonary Rehab Follow up with Dr. Nechama Guard re: changing Brilinta to Plavix Follow up with Dr. Halford Chessman in 3 months   Tobacco Abuse Quit 2014 with 94 pack year Smoking History Plan Offer Lung Cancer Screening, as patient meets criteria for screening  OSA Plan Continue BiPAP every night as you have been doing. You are getting benefit from therapy.   This appointment was 30 min long with over 50% of the time in direct face-to-face patient care, assessment, plan of care, and follow-up.    Magdalen Spatz, NP 04/26/2020  5:35 PM

## 2020-04-23 ENCOUNTER — Encounter (HOSPITAL_COMMUNITY): Payer: Self-pay | Admitting: *Deleted

## 2020-04-23 NOTE — Progress Notes (Signed)
Received referral from Dr. Halford Chessman for this pt to participate in pulmonary rehab with the the diagnosis of dyspnea and OSA with CPAP. Clinical review of pt follow up appt on 5/26 with Eric Form NP Pulmonary office note.  Pt with Covid Risk Score - 6. Pt appropriate for scheduling for Pulmonary rehab.  Will forward to support staff for verification of insurance eligibility/benefits and to pulmonary rehab staff for scheduling with pt consent. Cherre Huger, BSN Cardiac and Training and development officer

## 2020-04-26 ENCOUNTER — Encounter: Payer: Self-pay | Admitting: Acute Care

## 2020-04-28 ENCOUNTER — Telehealth (HOSPITAL_COMMUNITY): Payer: Self-pay

## 2020-04-28 NOTE — Telephone Encounter (Signed)
Pt insurance is active and benefits verified through [employer] adv Co-pay $10, DED 0/0 met, out of pocket $3,100/$189.44 met, co-insurance 0%. nof pre-authorization required, Veronica/HTA, REF# [id number]

## 2020-04-29 NOTE — Progress Notes (Signed)
Reviewed and agree with assessment/plan.   Chesley Mires, MD [facility] Pulmonary/Critical Care 04/29/2020, 12:53 PM Pager:  [phone]

## 2020-04-30 ENCOUNTER — Telehealth (HOSPITAL_COMMUNITY): Payer: Self-pay | Admitting: *Deleted

## 2020-04-30 ENCOUNTER — Other Ambulatory Visit: Payer: Self-pay | Admitting: Internal Medicine

## 2020-04-30 DIAGNOSIS — G4733 Obstructive sleep apnea (adult) (pediatric): Secondary | ICD-10-CM | POA: Diagnosis not present

## 2020-04-30 NOTE — Telephone Encounter (Signed)
LMTCB re: pulmonary rehab referral @ [phone].

## 2020-05-06 ENCOUNTER — Other Ambulatory Visit: Payer: Self-pay | Admitting: Internal Medicine

## 2020-05-11 ENCOUNTER — Telehealth: Payer: Self-pay

## 2020-05-11 NOTE — Telephone Encounter (Signed)
Returned pt's call after speaking with Dr. Trula Slade regarding pt's SOB he feels is due to [place]. Per Dr. Trula Slade, pt can stop Brillinta. Pt is also c/o rest pain. We are moving his August studies and PA f/u to a sooner date. He does not want to be scheduled until end of month or early July, as he will be out of town. Pt is aware and verbalized understanding of all the above. He will call us if anything changes/worsens.

## 2020-05-15 ENCOUNTER — Encounter: Payer: PPO | Admitting: Nurse Practitioner

## 2020-05-15 ENCOUNTER — Other Ambulatory Visit: Payer: Self-pay | Admitting: *Deleted

## 2020-05-15 DIAGNOSIS — I779 Disorder of arteries and arterioles, unspecified: Secondary | ICD-10-CM

## 2020-05-15 DIAGNOSIS — Z95828 Presence of other vascular implants and grafts: Secondary | ICD-10-CM

## 2020-05-19 ENCOUNTER — Telehealth (HOSPITAL_COMMUNITY): Payer: Self-pay

## 2020-05-19 NOTE — Telephone Encounter (Signed)
Closed referral per PR staff, pt was called and mailed letter with no response. °

## 2020-05-25 DIAGNOSIS — G4730 Sleep apnea, unspecified: Secondary | ICD-10-CM | POA: Diagnosis not present

## 2020-05-25 DIAGNOSIS — G4733 Obstructive sleep apnea (adult) (pediatric): Secondary | ICD-10-CM | POA: Diagnosis not present

## 2020-05-27 ENCOUNTER — Ambulatory Visit (HOSPITAL_COMMUNITY): Payer: PPO

## 2020-05-27 ENCOUNTER — Ambulatory Visit (HOSPITAL_COMMUNITY): Payer: PPO | Attending: Vascular Surgery

## 2020-05-27 ENCOUNTER — Ambulatory Visit: Payer: PPO

## 2020-05-28 ENCOUNTER — Ambulatory Visit: Payer: PPO

## 2020-05-29 ENCOUNTER — Ambulatory Visit (INDEPENDENT_AMBULATORY_CARE_PROVIDER_SITE_OTHER): Payer: PPO | Admitting: Student

## 2020-05-29 ENCOUNTER — Other Ambulatory Visit: Payer: Self-pay

## 2020-05-29 ENCOUNTER — Encounter: Payer: Self-pay | Admitting: Student

## 2020-05-29 VITALS — BP 134/70 | HR 60 | Ht 64.0 in | Wt 196.0 lb

## 2020-05-29 DIAGNOSIS — I2510 Atherosclerotic heart disease of native coronary artery without angina pectoris: Secondary | ICD-10-CM | POA: Diagnosis not present

## 2020-05-29 DIAGNOSIS — I441 Atrioventricular block, second degree: Secondary | ICD-10-CM

## 2020-05-29 DIAGNOSIS — I10 Essential (primary) hypertension: Secondary | ICD-10-CM | POA: Diagnosis not present

## 2020-05-29 DIAGNOSIS — Z950 Presence of cardiac pacemaker: Secondary | ICD-10-CM

## 2020-05-29 LAB — CUP PACEART INCLINIC DEVICE CHECK
Battery Remaining Longevity: 139 mo
Battery Voltage: 3.15 V
Brady Statistic AP VP Percent: 15.43 %
Brady Statistic AP VS Percent: 0 %
Brady Statistic AS VP Percent: 83.52 %
Brady Statistic AS VS Percent: 1.05 %
Brady Statistic RA Percent Paced: 15.53 %
Brady Statistic RV Percent Paced: 98.95 %
Date Time Interrogation Session: 20210702125211
Implantable Lead Implant Date: 20201231
Implantable Lead Implant Date: 20201231
Implantable Lead Location: 753859
Implantable Lead Location: 753860
Implantable Lead Model: 3830
Implantable Lead Model: 5076
Implantable Pulse Generator Implant Date: 20201231
Lead Channel Impedance Value: 380 Ohm
Lead Channel Impedance Value: 418 Ohm
Lead Channel Impedance Value: 627 Ohm
Lead Channel Impedance Value: 665 Ohm
Lead Channel Pacing Threshold Amplitude: 0.75 V
Lead Channel Pacing Threshold Amplitude: 0.875 V
Lead Channel Pacing Threshold Pulse Width: 0.4 ms
Lead Channel Pacing Threshold Pulse Width: 0.4 ms
Lead Channel Sensing Intrinsic Amplitude: 5.375 mV
Lead Channel Sensing Intrinsic Amplitude: 5.75 mV
Lead Channel Sensing Intrinsic Amplitude: 7.125 mV
Lead Channel Sensing Intrinsic Amplitude: 8.25 mV
Lead Channel Setting Pacing Amplitude: 1.5 V
Lead Channel Setting Pacing Amplitude: 2.5 V
Lead Channel Setting Pacing Pulse Width: 0.4 ms
Lead Channel Setting Sensing Sensitivity: 1.2 mV

## 2020-05-29 LAB — CBC WITH DIFFERENTIAL/PLATELET
Basophils Absolute: 0.1 10*3/uL (ref 0.0–0.2)
Basos: 1 %
EOS (ABSOLUTE): 0.4 10*3/uL (ref 0.0–0.4)
Eos: 4 %
Hematocrit: 37 % — ABNORMAL LOW (ref 37.5–51.0)
Hemoglobin: 12.6 g/dL — ABNORMAL LOW (ref 13.0–17.7)
Immature Grans (Abs): 0 10*3/uL (ref 0.0–0.1)
Immature Granulocytes: 0 %
Lymphocytes Absolute: 1.9 10*3/uL (ref 0.7–3.1)
Lymphs: 23 %
MCH: 33.2 pg — ABNORMAL HIGH (ref 26.6–33.0)
MCHC: 34.1 g/dL (ref 31.5–35.7)
MCV: 97 fL (ref 79–97)
Monocytes Absolute: 0.9 10*3/uL (ref 0.1–0.9)
Monocytes: 11 %
Neutrophils Absolute: 5.2 10*3/uL (ref 1.4–7.0)
Neutrophils: 61 %
Platelets: 228 10*3/uL (ref 150–450)
RBC: 3.8 x10E6/uL — ABNORMAL LOW (ref 4.14–5.80)
RDW: 12.9 % (ref 11.6–15.4)
WBC: 8.5 10*3/uL (ref 3.4–10.8)

## 2020-05-29 LAB — BASIC METABOLIC PANEL
BUN/Creatinine Ratio: 23 (ref 10–24)
BUN: 28 mg/dL — ABNORMAL HIGH (ref 8–27)
CO2: 20 mmol/L (ref 20–29)
Calcium: 9.5 mg/dL (ref 8.6–10.2)
Chloride: 101 mmol/L (ref 96–106)
Creatinine, Ser: 1.24 mg/dL (ref 0.76–1.27)
GFR calc Af Amer: 68 mL/min/{1.73_m2} (ref >59)
GFR calc non Af Amer: 59 mL/min/{1.73_m2} — ABNORMAL LOW (ref >59)
Glucose: 98 mg/dL (ref 65–99)
Potassium: 4.3 mmol/L (ref 3.5–5.2)
Sodium: 136 mmol/L (ref 134–144)

## 2020-05-29 NOTE — Progress Notes (Signed)
Electrophysiology Office Note Date: 05/29/2020  ID:  Douglas Christensen, DOB 07/20/1951, MRN [id number]  PCP: Cassandria Anger, MD Primary Cardiologist: Sherren Mocha, MD Electrophysiologist: Thompson Grayer, MD   CC: Pacemaker follow-up  Douglas Christensen is a 69 y.o. male seen today for Thompson Grayer, MD for routine electrophysiology followup.  Since last being seen in our clinic the patient reports doing well overall.  He does state he ran out of his potassium about a week ago and hasn't gotten it refilled yet.  he denies chest pain, palpitations, dyspnea, PND, orthopnea, nausea, vomiting, dizziness, syncope, edema, weight gain, or early satiety.  Device History: Medtronic Dual Chamber PPM implanted 10/2019 for symptomatic second degree AV block with frequent 2:1 AV conduction  Past Medical History:  Diagnosis Date  . Anxiety   . Arthritis   . CAD (coronary artery disease)    Mild plaque (cath "years ago"); abnormal Myoview 04/2013 with subsequent CABG x 5 with LIMA to LAD, SVG to OM1, SVG to DX, SVG to PD & PL.   . Carotid artery disease ([place])    Carotid US 07/2019 bilateral ICA 40-59; bilateral subclavian stenosis  . Cataract   . GERD (gastroesophageal reflux disease)   . History of colonic polyps   . History of echocardiogram    Echo 2/19: Mild LVH, EF 60-65, normal wall motion, grade 2 diastolic dysfunction, mild RAE  . History of nuclear stress test    Myoview 2/19: inf/inf-lat/apical inf/ant-sept defect (?diaph atten - cannot rule out peri-infarct ischemia), PVCs/PACs/Mobitz 1 // Myoview 05/2019: EF 60, inf infarct with mild peri-infarct ischemia; no significant change when compared to prior study; Intermediate Risk  . Hx of echocardiogram    Echo (9/15):  EF 55-60%; Gr 2 DD, mild BAE  . Hyperlipidemia   . Hypertension   . LBP (low back pain)   . Meralgia paresthetica of left side 2011  . Polycythemia, secondary 02/03/2020  . PVD (peripheral vascular disease) ([place])    Stent to left  common femoral and right superficial femoral.  2008.  50%  left renal   . Second degree AV block, Mobitz type I    Holter 2/19: Sinus rhythm, average heart rate 72, frequent PVCs (burden 5%), second-degree type I AV block and periods of 2:1 heart block >> continue clinical managment and avoid AVN blocking agents  . Shortness of breath    "once in awhile; can happen at anytime" (08/26/2013)  . Sleep apnea    mod OSA, central sleep apnea/hypoapnea syndrome 11/22/12, CPAP every night   . Subclavian artery stenosis ([place])    Carotid US 07/2019 bilateral ICA 40-59; bilateral subclavian stenosis  . Vitamin D deficiency    Past Surgical History:  Procedure Laterality Date  . ABDOMINAL AORTAGRAM N/A 11/16/2011   Procedure: ABDOMINAL Maxcine Ham;  Surgeon: Sherren Mocha, MD;  Location: [facility] CATH LAB;  Service: Cardiovascular;  Laterality: N/A;  . ABDOMINAL AORTAGRAM N/A 11/14/2012   Procedure: ABDOMINAL Maxcine Ham;  Surgeon: Sherren Mocha, MD;  Location: [facility] CATH LAB;  Service: Cardiovascular;  Laterality: N/A;  . ABDOMINAL AORTAGRAM N/A 12/30/2014   Procedure: ABDOMINAL Maxcine Ham;  Surgeon: Serafina Mitchell, MD;  Location: [facility] CATH LAB;  Service: Cardiovascular;  Laterality: N/A;  . ABDOMINAL AORTOGRAM W/LOWER EXTREMITY Bilateral 08/06/2019   Procedure: ABDOMINAL AORTOGRAM W/LOWER EXTREMITY;  Surgeon: Serafina Mitchell, MD;  Location: [place] CV LAB;  Service: Cardiovascular;  Laterality: Bilateral;  . CARDIAC CATHETERIZATION  05/02/13   x2   . CORONARY  ARTERY BYPASS GRAFT N/A 05/06/2013   Procedure: CORONARY ARTERY BYPASS GRAFTING (CABG);  Surgeon: Melrose Nakayama, MD;  Location: [place];  Service: Open Heart Surgery;  Laterality: N/A;  Coronary artery bypass graft times five using left internal mammary artery and left greater saphenous vein via endovein harvest.  . ENDARTERECTOMY FEMORAL Left 01/29/2015   Procedure: ENDARTERECTOMY FEMORAL WITH PATCH ANGIOPLASTY;  Surgeon: Serafina Mitchell, MD;  Location: [place];  Service: Vascular;  Laterality: Left;  . EYE SURGERY  03/24/16   cataract surgery on left eye  . FEMORAL-POPLITEAL BYPASS GRAFT  12/27/2012   Procedure: BYPASS GRAFT FEMORAL-POPLITEAL ARTERY;  Surgeon: Serafina Mitchell, MD;  Location: [facility] OR;  Service: Vascular;  Laterality: Right;  using non-reversed sapphenous vein.  Marland Kitchen ILIAC ATHERECTOMY Left 01/29/2015   Procedure: SUPERFICIAL FEMORAL ARTERY ATHERECTOMY/PERCUTANEOUS TRANSLUMINAL ANGIOPLASTY; superficial femoral artery stent;  Surgeon: Serafina Mitchell, MD;  Location: [place];  Service: Vascular;  Laterality: Left;  . LOWER EXTREMITY ANGIOGRAM Bilateral 11/16/2011   Procedure: LOWER EXTREMITY ANGIOGRAM;  Surgeon: Sherren Mocha, MD;  Location: [facility] CATH LAB;  Service: Cardiovascular;  Laterality: Bilateral;  . lower extremity stents     bilateral lower extremities x 2  . PACEMAKER IMPLANT N/A 11/28/2019   Procedure: PACEMAKER IMPLANT;  Surgeon: Thompson Grayer, MD;  Location: [place] CV LAB;  Service: Cardiovascular;  Laterality: N/A;  . PERCUTANEOUS STENT INTERVENTION Left 11/16/2011   Procedure: PERCUTANEOUS STENT INTERVENTION;  Surgeon: Sherren Mocha, MD;  Location[facility] CATH LAB;  Service: Cardiovascular;  Laterality: Left;  . PERIPHERAL VASCULAR ATHERECTOMY Right 08/06/2019   Procedure: PERIPHERAL VASCULAR ATHERECTOMY;  Surgeon: Serafina Mitchell, MD;  Location: [place] CV LAB;  Service: Cardiovascular;  Laterality: Right;  Common iliac, external iliac, and popliteal.  . PERIPHERAL VASCULAR INTERVENTION Right 08/06/2019   Procedure: PERIPHERAL VASCULAR INTERVENTION;  Surgeon: Serafina Mitchell, MD;  Location: [place] CV LAB;  Service: Cardiovascular;  Laterality: Right;  common iliac, external iliac, and popliteal.  . TONSILLECTOMY    . TOTAL HIP ARTHROPLASTY Left 06/29/2015   Procedure: TOTAL HIP ARTHROPLASTY;  Surgeon: Frederik Pear, MD;  Location: [place];  Service: Orthopedics;  Laterality: Left;  LEFT TOTAL HIP ARTHROPLASTY DEPUY  SROM/PINNACLE    Current Outpatient Medications  Medication Sig Dispense Refill  . acetaminophen (TYLENOL) 650 MG CR tablet Take 1,300 mg by mouth every 8 (eight) hours as needed for pain.    . cholecalciferol (VITAMIN D) 1000 units tablet Take 1,000 Units by mouth daily.     . clindamycin (CLINDAGEL) 1 % gel Apply 1 application topically 2 (two) times daily.     . clonazePAM (KLONOPIN) 0.5 MG tablet Take 0.5 mg by mouth 2 (two) times daily as needed for anxiety.    . hydrALAZINE (APRESOLINE) 50 MG tablet TAKE 1 tablet BY MOUTH THREE TIMES DAILY 270 tablet 3  . losartan (COZAAR) 25 MG tablet Take 75 mg by mouth daily. 2 tabs in the am, 1 tab in the pm    . metoprolol succinate (TOPROL XL) 25 MG 24 hr tablet Take 1 tablet (25 mg total) by mouth daily. 90 tablet 3  . potassium chloride (KLOR-CON) 8 MEQ tablet Take 1 tablet (8 mEq total) by mouth daily. 90 tablet 3  . rosuvastatin (CRESTOR) 40 MG tablet Take 40 mg by mouth daily.    . tadalafil (CIALIS) 20 MG tablet Take 20 mg by mouth daily as needed for erectile dysfunction.    . traMADol (ULTRAM) 50 MG  tablet Take 50 mg by mouth every 6 (six) hours as needed (pain).     . triamterene-hydrochlorothiazide (MAXZIDE-25) 37.5-25 MG tablet TAKE TWO TABLETS BY MOUTH EVERY DAY 60 tablet 11  . vitamin B-12 (CYANOCOBALAMIN) 1000 MCG tablet Take 1,000 mcg by mouth daily.     No current facility-administered medications for this visit.    Allergies:   Roxicodone [oxycodone hcl], Benazepril, Gabapentin, Itraconazole, Lipitor [atorvastatin calcium], and Plavix [clopidogrel bisulfate]   Social History: Social History   Socioeconomic History  . Marital status: Widowed    Spouse name: Not on file  . Number of children: 2  . Years of education: Not on file  . Highest education level: Not on file  Occupational History  . Occupation: English as a second language teacher: [employer]  Tobacco Use  . Smoking status: Former Smoker    Packs/day: 2.00    Years:  47.00    Pack years: 94.00    Types: Cigarettes    Quit date: 12/17/2012    Years since quitting: 7.4  . Smokeless tobacco: Never Used  Vaping Use  . Vaping Use: Never used  Substance and Sexual Activity  . Alcohol use: Yes    Alcohol/week: 4.0 standard drinks    Types: 4 Shots of liquor per week    Comment: 08/26/2013 "3-4 mixed drinks/wk"  . Drug use: No  . Sexual activity: Yes  Other Topics Concern  . Not on file  Social History Narrative   Widower   Social Determinants of Health   Financial Resource Strain:   . Difficulty of Paying Living Expenses:   Food Insecurity:   . Worried About Charity fundraiser in the Last Year:   . Arboriculturist in the Last Year:   Transportation Needs:   . Film/video editor (Medical):   Marland Kitchen Lack of Transportation (Non-Medical):   Physical Activity:   . Days of Exercise per Week:   . Minutes of Exercise per Session:   Stress:   . Feeling of Stress :   Social Connections:   . Frequency of Communication with Friends and Family:   . Frequency of Social Gatherings with Friends and Family:   . Attends Religious Services:   . Active Member of Clubs or Organizations:   . Attends Archivist Meetings:   Marland Kitchen Marital Status:   Intimate Partner Violence:   . Fear of Current or Ex-Partner:   . Emotionally Abused:   Marland Kitchen Physically Abused:   . Sexually Abused:     Family History: Family History  Problem Relation Age of Onset  . Heart disease Mother        No clear CAD and Heart Disease before age 55  . Hypertension Mother   . Cancer Mother        ? colon ca  . Hyperlipidemia Mother   . Cancer Father        brain tumor  . Alzheimer's disease Father   . Colon cancer Neg Hx   . Heart attack Neg Hx   . Esophageal cancer Neg Hx   . Liver cancer Neg Hx   . Rectal cancer Neg Hx   . Stomach cancer Neg Hx   . Pancreatic cancer Neg Hx      Review of Systems: All other systems reviewed and are otherwise negative except as noted  above.  Physical Exam: Vitals:   05/29/20 1201  BP: 134/70  Pulse: 60  SpO2: 98%  Weight: 196 lb (88.9  kg)  Height: 5\' 4"  (1.626 m)     GEN- The patient is well appearing, alert and oriented x 3 today.   HEENT: normocephalic, atraumatic; sclera clear, conjunctiva pink; hearing intact; oropharynx clear; neck supple  Lungs- Clear to ausculation bilaterally, normal work of breathing.  No wheezes, rales, rhonchi Heart- Regular rate and rhythm, no murmurs, rubs or gallops  GI- soft, non-tender, non-distended, bowel sounds present  Extremities- no clubbing, cyanosis, or edema  MS- no significant deformity or atrophy Skin- warm and dry, no rash or lesion; PPM pocket well healed Psych- euthymic mood, full affect Neuro- strength and sensation are intact  PPM Interrogation- reviewed in detail today,  See PACEART report  EKG:  EKG is ordered today. The ekg ordered today shows AP VP at 60 bpm, personally reviewed.  Recent Labs: 06/04/2019: NT-Pro BNP 93 04/20/2020: ALT 29; BUN 30; Creatinine, Ser 1.87; Hemoglobin 12.3; Platelets 224; Potassium 3.3; Sodium 137   Wt Readings from Last 3 Encounters:  05/29/20 196 lb (88.9 kg)  04/22/20 196 lb (88.9 kg)  04/20/20 195 lb 1.7 oz (88.5 kg)     Other studies Reviewed: Additional studies/ records that were reviewed today include: Previous EP office notes, Previous remote checks, Most recent labwork.   Assessment and Plan:  1. Advanced AV block s/p Medtronic PPM  Normal PPM function See Pace Art report No changes today  2. CAD Followed by Dr. Burt Knack Normal EF by Echo 03/2020  3. PVD Followed by Dr. Trula Slade Followed annually with US/TBIs  4. Obesity Body mass index is 33.64 kg/m.  Encouraged lifestyle modification  5. HTN Stable.  6. Hypokalemia Last K 3.3 and pt has been out of K for about a week. Will check today. Pt ordered 90 day supply with 3 refills on 02/24/20, so just needs to pick up. Instructed him to please pick up.  Suspect may need an increase.  Current medicines are reviewed at length with the patient today.   The patient does not have concerns regarding his medicines.  The following changes were made today:  none  Labs/ tests ordered today include:  Orders Placed This Encounter  Procedures  . CBC w/Diff  . Basic Metabolic Panel (BMET)  . CUP PACEART [place]  . EKG 12-Lead    Disposition:   Follow up with Dr. Rayann Heman or EP APP in 12 months.   Jacalyn Lefevre, PA-C  05/29/2020 12:58 PM  [address]  [place] [phone] (office) [phone] (fax)

## 2020-05-29 NOTE — Patient Instructions (Signed)
Medication Instructions:  *If you need a refill on your cardiac medications before your next appointment, please call your pharmacy*  Lab Work: Your physician has recommended that you have lab work today: BMET and CBC  If you have labs (blood work) drawn today and your tests are completely normal, you will receive your results only by:  MyChart Message (if you have MyChart) OR  A paper copy in the mail If you have any lab test that is abnormal or we need to change your treatment, we will call you to review the results.  Follow-Up: At [facility], you and your health needs are our priority.  As part of our continuing mission to provide you with exceptional heart care, we have created designated Provider Care Teams.  These Care Teams include your primary Cardiologist (physician) and Advanced Practice Providers (APPs -  Physician Assistants and Nurse Practitioners) who all work together to provide you with the care you need, when you need it.  We recommend signing up for the patient portal called "MyChart".  Sign up information is provided on this After Visit Summary.  MyChart is used to connect with patients for Virtual Visits (Telemedicine).  Patients are able to view lab/test results, encounter notes, upcoming appointments, etc.  Non-urgent messages can be sent to your provider as well.   To learn more about what you can do with MyChart, go to [email or web address].    Your next appointment:   Your physician wants you to follow-up in: 1 YEAR with Dr. Rayann Heman. You will receive a reminder letter in the mail two months in advance. If you don't receive a letter, please call our office to schedule the follow-up appointment.  Remote monitoring is used to monitor your Pacemaker of ICD from home. This monitoring reduces the number of office visits required to check your device to one time per year. It allows [place] to keep an eye on the functioning of your device to ensure it is working properly. You  are scheduled for a device check from home on 08/27/20. You may send your transmission at any time that day. If you have a wireless device, the transmission will be sent automatically. After your physician reviews your transmission, you will receive a postcard with your next transmission date.  The format for your next appointment:   In Person with Thompson Grayer, MD

## 2020-05-30 DIAGNOSIS — G4733 Obstructive sleep apnea (adult) (pediatric): Secondary | ICD-10-CM | POA: Diagnosis not present

## 2020-06-03 ENCOUNTER — Telehealth: Payer: Self-pay | Admitting: Pulmonary Disease

## 2020-06-03 NOTE — Telephone Encounter (Signed)
ATC unable to reach will call back

## 2020-06-04 NOTE — Telephone Encounter (Signed)
LMTCB x2 for pt 

## 2020-06-05 NOTE — Telephone Encounter (Signed)
LMTCB x3 for pt.  

## 2020-06-10 ENCOUNTER — Other Ambulatory Visit: Payer: Self-pay | Admitting: Internal Medicine

## 2020-06-11 ENCOUNTER — Other Ambulatory Visit: Payer: Self-pay | Admitting: Physician Assistant

## 2020-06-15 ENCOUNTER — Other Ambulatory Visit: Payer: Self-pay | Admitting: Physician Assistant

## 2020-06-17 ENCOUNTER — Other Ambulatory Visit: Payer: Self-pay

## 2020-06-17 DIAGNOSIS — I779 Disorder of arteries and arterioles, unspecified: Secondary | ICD-10-CM

## 2020-06-30 ENCOUNTER — Ambulatory Visit (HOSPITAL_COMMUNITY)
Admission: RE | Admit: 2020-06-30 | Discharge: 2020-06-30 | Disposition: A | Discharge status: Home / Self Care | Payer: PPO | Source: Ambulatory Visit | Attending: Vascular Surgery | Admitting: Vascular Surgery

## 2020-06-30 ENCOUNTER — Other Ambulatory Visit: Payer: Self-pay

## 2020-06-30 ENCOUNTER — Ambulatory Visit (INDEPENDENT_AMBULATORY_CARE_PROVIDER_SITE_OTHER): Payer: PPO | Admitting: Physician Assistant

## 2020-06-30 ENCOUNTER — Ambulatory Visit (INDEPENDENT_AMBULATORY_CARE_PROVIDER_SITE_OTHER)
Admission: RE | Admit: 2020-06-30 | Discharge: 2020-06-30 | Disposition: A | Discharge status: Home / Self Care | Payer: PPO | Source: Ambulatory Visit | Attending: Vascular Surgery | Admitting: Vascular Surgery

## 2020-06-30 VITALS — BP 156/72 | HR 61 | Temp 97.7°F | Resp 20 | Ht 64.0 in | Wt 197.4 lb

## 2020-06-30 DIAGNOSIS — I779 Disorder of arteries and arterioles, unspecified: Secondary | ICD-10-CM

## 2020-06-30 DIAGNOSIS — G4733 Obstructive sleep apnea (adult) (pediatric): Secondary | ICD-10-CM | POA: Diagnosis not present

## 2020-06-30 NOTE — Progress Notes (Addendum)
Office Note     CC:  follow up Requesting Provider:  Plotnikov, Evie Lacks, MD  HPI: Douglas Christensen is a 69 y.o. (1951/08/09) male who presents for routine follow-up of peripheral arterial disease.  He states he has no pain when walking and no pain at nighttime.  He does complain of significant numbness of his toes making it extremely difficult to wear closed toe shoes.  He has an abrasion of the dorsum of the right fifth toe and left fourth toe.  He has been aware of these areas and states they are healing without concern.  2 to 3 months ago, he developed worsening shortness of breath and was evaluated by his pulmonologist and his cardiologist and underwent echocardiogram.  His cardiologist determine the most likely etiology was his Brilinta.  He stopped this approximately 2 months ago with near resolution of his symptoms.  He is asking about aspirin use now that he is off his Brilinta.  He also tells me he was part of a clinical trial and was on Plavix for some period of time but was not prescribed this medication.   He is s/patherectomy and stent placement of the right common iliac artery, atherectomy and stent placement right external iliac artery, atherectomy and stent placement of right popliteal artery on 08-06-19 by Dr. Trula Slade for right leg claudication.   He is also s/p left femoral endarterectomy with vein patch angioplasty, followed by atherectomy and stenting of his left iliac artery and superficial femoral artery on 01-29-15 by Dr. Trula Slade. This was done for claudication. Overlapping 7 mm Cordis stents were placed.   He has a history of right femoral to above-knee popliteal artery bypass graft with vein which was performed on 12/10/2012.   Surgical history also significant for CABG x 5 vessels in June 2014. Has been on Brilinta in the past with complaints of nose bleeds and recently SOB. Medical history significant for DM Quit smoking in 2008. Previous 45 pyh.  Past Medical History:    Diagnosis Date  . Anxiety   . Arthritis   . CAD (coronary artery disease)    Mild plaque (cath "years ago"); abnormal Myoview 04/2013 with subsequent CABG x 5 with LIMA to LAD, SVG to OM1, SVG to DX, SVG to PD & PL.   . Carotid artery disease ([place])    Carotid US 07/2019 bilateral ICA 40-59; bilateral subclavian stenosis  . Cataract   . GERD (gastroesophageal reflux disease)   . History of colonic polyps   . History of echocardiogram    Echo 2/19: Mild LVH, EF 60-65, normal wall motion, grade 2 diastolic dysfunction, mild RAE  . History of nuclear stress test    Myoview 2/19: inf/inf-lat/apical inf/ant-sept defect (?diaph atten - cannot rule out peri-infarct ischemia), PVCs/PACs/Mobitz 1 // Myoview 05/2019: EF 60, inf infarct with mild peri-infarct ischemia; no significant change when compared to prior study; Intermediate Risk  . Hx of echocardiogram    Echo (9/15):  EF 55-60%; Gr 2 DD, mild BAE  . Hyperlipidemia   . Hypertension   . LBP (low back pain)   . Meralgia paresthetica of left side 2011  . Polycythemia, secondary 02/03/2020  . PVD (peripheral vascular disease) ([place])    Stent to left common femoral and right superficial femoral.  2008.  50%  left renal   . Second degree AV block, Mobitz type I    Holter 2/19: Sinus rhythm, average heart rate 72, frequent PVCs (burden 5%), second-degree type I AV block  and periods of 2:1 heart block >> continue clinical managment and avoid AVN blocking agents  . Shortness of breath    "once in awhile; can happen at anytime" (08/26/2013)  . Sleep apnea    mod OSA, central sleep apnea/hypoapnea syndrome 11/22/12, CPAP every night   . Subclavian artery stenosis ([place])    Carotid US 07/2019 bilateral ICA 40-59; bilateral subclavian stenosis  . Vitamin D deficiency     Past Surgical History:  Procedure Laterality Date  . ABDOMINAL AORTAGRAM N/A 11/16/2011   Procedure: ABDOMINAL Maxcine Ham;  Surgeon: Sherren Mocha, MD;  Location: [facility] CATH LAB;   Service: Cardiovascular;  Laterality: N/A;  . ABDOMINAL AORTAGRAM N/A 11/14/2012   Procedure: ABDOMINAL Maxcine Ham;  Surgeon: Sherren Mocha, MD;  Location: [facility] CATH LAB;  Service: Cardiovascular;  Laterality: N/A;  . ABDOMINAL AORTAGRAM N/A 12/30/2014   Procedure: ABDOMINAL Maxcine Ham;  Surgeon: Serafina Mitchell, MD;  Location: [facility] CATH LAB;  Service: Cardiovascular;  Laterality: N/A;  . ABDOMINAL AORTOGRAM W/LOWER EXTREMITY Bilateral 08/06/2019   Procedure: ABDOMINAL AORTOGRAM W/LOWER EXTREMITY;  Surgeon: Serafina Mitchell, MD;  Location: [place] CV LAB;  Service: Cardiovascular;  Laterality: Bilateral;  . CARDIAC CATHETERIZATION  05/02/13   x2   . CORONARY ARTERY BYPASS GRAFT N/A 05/06/2013   Procedure: CORONARY ARTERY BYPASS GRAFTING (CABG);  Surgeon: Melrose Nakayama, MD;  Location: [place];  Service: Open Heart Surgery;  Laterality: N/A;  Coronary artery bypass graft times five using left internal mammary artery and left greater saphenous vein via endovein harvest.  . ENDARTERECTOMY FEMORAL Left 01/29/2015   Procedure: ENDARTERECTOMY FEMORAL WITH PATCH ANGIOPLASTY;  Surgeon: Serafina Mitchell, MD;  Location: [place];  Service: Vascular;  Laterality: Left;  . EYE SURGERY  03/24/16   cataract surgery on left eye  . FEMORAL-POPLITEAL BYPASS GRAFT  12/27/2012   Procedure: BYPASS GRAFT FEMORAL-POPLITEAL ARTERY;  Surgeon: Serafina Mitchell, MD;  Location: [facility] OR;  Service: Vascular;  Laterality: Right;  using non-reversed sapphenous vein.  Marland Kitchen ILIAC ATHERECTOMY Left 01/29/2015   Procedure: SUPERFICIAL FEMORAL ARTERY ATHERECTOMY/PERCUTANEOUS TRANSLUMINAL ANGIOPLASTY; superficial femoral artery stent;  Surgeon: Serafina Mitchell, MD;  Location: [place];  Service: Vascular;  Laterality: Left;  . LOWER EXTREMITY ANGIOGRAM Bilateral 11/16/2011   Procedure: LOWER EXTREMITY ANGIOGRAM;  Surgeon: Sherren Mocha, MD;  Location: [facility] CATH LAB;  Service: Cardiovascular;  Laterality: Bilateral;  . lower extremity stents     bilateral  lower extremities x 2  . PACEMAKER IMPLANT N/A 11/28/2019   Procedure: PACEMAKER IMPLANT;  Surgeon: Thompson Grayer, MD;  Location: [place] CV LAB;  Service: Cardiovascular;  Laterality: N/A;  . PERCUTANEOUS STENT INTERVENTION Left 11/16/2011   Procedure: PERCUTANEOUS STENT INTERVENTION;  Surgeon: Sherren Mocha, MD;  Location: [facility] CATH LAB;  Service: Cardiovascular;  Laterality: Left;  . PERIPHERAL VASCULAR ATHERECTOMY Right 08/06/2019   Procedure: PERIPHERAL VASCULAR ATHERECTOMY;  Surgeon: Serafina Mitchell, MD;  Location: [place] CV LAB;  Service: Cardiovascular;  Laterality: Right;  Common iliac, external iliac, and popliteal.  . PERIPHERAL VASCULAR INTERVENTION Right 08/06/2019   Procedure: PERIPHERAL VASCULAR INTERVENTION;  Surgeon: Serafina Mitchell, MD;  Location: [place] CV LAB;  Service: Cardiovascular;  Laterality: Right;  common iliac, external iliac, and popliteal.  . TONSILLECTOMY    . TOTAL HIP ARTHROPLASTY Left 06/29/2015   Procedure: TOTAL HIP ARTHROPLASTY;  Surgeon: Frederik Pear, MD;  Location: [place];  Service: Orthopedics;  Laterality: Left;  LEFT TOTAL HIP ARTHROPLASTY DEPUY SROM/PINNACLE    Social History  Socioeconomic History  . Marital status: Widowed    Spouse name: Not on file  . Number of children: 2  . Years of education: Not on file  . Highest education level: Not on file  Occupational History  . Occupation: English as a second language teacher: [employer]  Tobacco Use  . Smoking status: Former Smoker    Packs/day: 2.00    Years: 47.00    Pack years: 94.00    Types: Cigarettes    Quit date: 12/17/2012    Years since quitting: 7.5  . Smokeless tobacco: Never Used  Vaping Use  . Vaping Use: Never used  Substance and Sexual Activity  . Alcohol use: Yes    Alcohol/week: 4.0 standard drinks    Types: 4 Shots of liquor per week    Comment: 08/26/2013 "3-4 mixed drinks/wk"  . Drug use: No  . Sexual activity: Yes  Other Topics Concern  . Not on file  Social  History Narrative   Widower   Social Determinants of Health   Financial Resource Strain:   . Difficulty of Paying Living Expenses:   Food Insecurity:   . Worried About Charity fundraiser in the Last Year:   . Arboriculturist in the Last Year:   Transportation Needs:   . Film/video editor (Medical):   Marland Kitchen Lack of Transportation (Non-Medical):   Physical Activity:   . Days of Exercise per Week:   . Minutes of Exercise per Session:   Stress:   . Feeling of Stress :   Social Connections:   . Frequency of Communication with Friends and Family:   . Frequency of Social Gatherings with Friends and Family:   . Attends Religious Services:   . Active Member of Clubs or Organizations:   . Attends Archivist Meetings:   Marland Kitchen Marital Status:   Intimate Partner Violence:   . Fear of Current or Ex-Partner:   . Emotionally Abused:   Marland Kitchen Physically Abused:   . Sexually Abused:    Family History  Problem Relation Age of Onset  . Heart disease Mother        No clear CAD and Heart Disease before age 48  . Hypertension Mother   . Cancer Mother        ? colon ca  . Hyperlipidemia Mother   . Cancer Father        brain tumor  . Alzheimer's disease Father   . Colon cancer Neg Hx   . Heart attack Neg Hx   . Esophageal cancer Neg Hx   . Liver cancer Neg Hx   . Rectal cancer Neg Hx   . Stomach cancer Neg Hx   . Pancreatic cancer Neg Hx     Current Outpatient Medications  Medication Sig Dispense Refill  . acetaminophen (TYLENOL) 650 MG CR tablet Take 1,300 mg by mouth every 8 (eight) hours as needed for pain.    . cholecalciferol (VITAMIN D) 1000 units tablet Take 1,000 Units by mouth daily.     . clindamycin (CLINDAGEL) 1 % gel Apply 1 application topically 2 (two) times daily.     . clonazePAM (KLONOPIN) 0.5 MG tablet TAKE ONE TABLET TWICE DAILY AS NEEDED FOR ANXIETY 60 tablet 3  . hydrALAZINE (APRESOLINE) 50 MG tablet TAKE 1 tablet BY MOUTH THREE TIMES DAILY 270 tablet 3  .  losartan (COZAAR) 25 MG tablet Take 1 tablet (25 mg total) by mouth daily. Take 50 mg in the morning and  25 mg PM 180 tablet 3  . metoprolol succinate (TOPROL XL) 25 MG 24 hr tablet Take 1 tablet (25 mg total) by mouth daily. 90 tablet 3  . potassium chloride (KLOR-CON) 8 MEQ tablet Take 1 tablet (8 mEq total) by mouth daily. 90 tablet 3  . rosuvastatin (CRESTOR) 40 MG tablet Take 1 tablet (40 mg total) by mouth daily. 90 tablet 2  . tadalafil (CIALIS) 20 MG tablet Take 20 mg by mouth daily as needed for erectile dysfunction.    . traMADol (ULTRAM) 50 MG tablet Take 50 mg by mouth every 6 (six) hours as needed (pain).     . triamterene-hydrochlorothiazide (MAXZIDE-25) 37.5-25 MG tablet TAKE TWO TABLETS BY MOUTH EVERY DAY 60 tablet 11  . vitamin B-12 (CYANOCOBALAMIN) 1000 MCG tablet Take 1,000 mcg by mouth daily.     No current facility-administered medications for this visit.    Allergies  Allergen Reactions  . Roxicodone [Oxycodone Hcl] Other (See Comments)    hallucinations  . Benazepril Cough  . Gabapentin Rash  . Itraconazole Nausea Only and Rash  . Lipitor [Atorvastatin Calcium]     cramps  . Plavix [Clopidogrel Bisulfate] Rash     REVIEW OF SYSTEMS:   [X]  denotes positive finding, [ ]  denotes negative finding Cardiac  Comments:  Chest pain or chest pressure:    Shortness of breath upon exertion:    Short of breath when lying flat:    Irregular heart rhythm:        Vascular    Pain in calf, thigh, or hip brought on by ambulation:    Pain in feet at night that wakes you up from your sleep:     Blood clot in your veins:    Leg swelling:         Pulmonary    Oxygen at home:    Productive cough:     Wheezing:         Neurologic    Sudden weakness in arms or legs:     Sudden numbness in arms or legs:     Sudden onset of difficulty speaking or slurred speech:    Temporary loss of vision in one eye:     Problems with dizziness:         Gastrointestinal    Blood in  stool:     Vomited blood:         Genitourinary    Burning when urinating:     Blood in urine:        Psychiatric    Major depression:         Hematologic    Bleeding problems:    Problems with blood clotting too easily:        Skin    Rashes or ulcers:        Constitutional    Fever or chills:      PHYSICAL EXAMINATION:  Vitals:   06/30/20 0914  BP: (!) 156/72  Pulse: 61  Resp: 20  Temp: 97.7 F (36.5 C)  SpO2: 96%   General:  WDWN in NAD; vital signs documented above Gait: Normal; unaided HENT: WNL, normocephalic Pulmonary: normal non-labored breathing , without Rales, rhonchi,  wheezing Cardiac: regular HR, without  Murmurs without carotid bruit Abdomen: soft, NT, no masses Skin: without rashes Vascular Exam/Pulses: 2+ DP, femoral, radial, brachial pulses bilaterally. 1+ right PT pulse, 2+ left ulnar pulse. Popliteal and right ulnar pulses not palpable Extremities: without ischemic changes, without Gangrene , without  cellulitis; without open wounds;  Musculoskeletal: no muscle wasting or atrophy  Neurologic: A&O X 3;  No focal weakness or paresthesias are detected Psychiatric:  The pt has Normal affect.   Non-Invasive Vascular Imaging:    ABI/TBIToday's ABIToday's TBIPrevious ABIPrevious TBI  +-------+-----------+-----------+------------+------------+  Right 1.12    0.96    1.10    0.66      +-------+-----------+-----------+------------+------------+  Left  1.02    0.91    0.83    0.84      +-------+-----------+-----------+------------+------------+ Triphasic waveforms bilaterally Left TP = 118 Right TP = 124 Right common femoral artery 196 cm/s biphasic waveform. Left common  femoral artery 51 cm/s biphasic waveform.     Iliac duplex:  Suboptimal exam, further testing modality may be warranted.  Increased velocity in what appears to be the right distal EIA and Left EIA  suggests 50 - 99% stenosis  range. Unable to thoroughly visualize arteries  / stent walls.    ASSESSMENT/PLAN:: 69 y.o. male here for follow up for PAD.  No claudication or rest pain.  Peripheral neuropathy of both feet.  He is intolerant of gabapentin.  Recommend aspirin 81mg  daily.  Unsure of Plavix use in his case.  I will discuss with Dr. Trula Slade.   Follow-up in 6 months with ABIs and duplex of iliacs or sooner if he develops non-healing wounds or pain.   07/01/2020: Discussed patient encounter and anti-platelet medications with Dr. Trula Slade this morning. He reviewed patient's vascular studies as well.  Agrees with patient taking aspirin 81 mg daily. I telephoned the patient and reviewed this with him. He is in agreement and also has follow-up appointment with PCP regarding bilateral toe pain.  Barbie Banner, PA-C [facility] [phone]  Clinic MD:   [place]

## 2020-07-02 ENCOUNTER — Other Ambulatory Visit: Payer: Self-pay | Admitting: *Deleted

## 2020-07-02 ENCOUNTER — Encounter: Payer: Self-pay | Admitting: Internal Medicine

## 2020-07-02 ENCOUNTER — Other Ambulatory Visit: Payer: Self-pay

## 2020-07-02 ENCOUNTER — Ambulatory Visit (INDEPENDENT_AMBULATORY_CARE_PROVIDER_SITE_OTHER): Payer: PPO | Admitting: Internal Medicine

## 2020-07-02 VITALS — BP 118/60 | HR 77 | Temp 98.3°F | Ht 64.0 in | Wt 200.0 lb

## 2020-07-02 DIAGNOSIS — Z0000 Encounter for general adult medical examination without abnormal findings: Secondary | ICD-10-CM | POA: Diagnosis not present

## 2020-07-02 DIAGNOSIS — Z95828 Presence of other vascular implants and grafts: Secondary | ICD-10-CM

## 2020-07-02 DIAGNOSIS — I739 Peripheral vascular disease, unspecified: Secondary | ICD-10-CM

## 2020-07-02 DIAGNOSIS — G8929 Other chronic pain: Secondary | ICD-10-CM | POA: Diagnosis not present

## 2020-07-02 DIAGNOSIS — G629 Polyneuropathy, unspecified: Secondary | ICD-10-CM

## 2020-07-02 DIAGNOSIS — E538 Deficiency of other specified B group vitamins: Secondary | ICD-10-CM

## 2020-07-02 DIAGNOSIS — M5440 Lumbago with sciatica, unspecified side: Secondary | ICD-10-CM | POA: Diagnosis not present

## 2020-07-02 DIAGNOSIS — I779 Disorder of arteries and arterioles, unspecified: Secondary | ICD-10-CM

## 2020-07-02 MED ORDER — TRAMADOL HCL 50 MG PO TABS: 25.0000 mg | ORAL_TABLET | Freq: Four times a day (QID) | ORAL | 1 refills | Status: DC | PRN

## 2020-07-02 MED ORDER — CYANOCOBALAMIN 1000 MCG/ML IJ SOLN
1000.0000 ug | Freq: Once | INTRAMUSCULAR | Status: AC
  Administered 2020-07-02: 1000 ug via SUBCUTANEOUS

## 2020-07-02 NOTE — Assessment & Plan Note (Addendum)
Vit B complex B12 inj

## 2020-07-02 NOTE — Assessment & Plan Note (Signed)
Tramadol prn ° Potential benefits of a long term opioids use as well as potential risks (i.e. addiction risk, apnea etc) and complications (i.e. Somnolence, constipation and others) were explained to the patient and were aknowledged. ° ° °

## 2020-07-02 NOTE — Assessment & Plan Note (Signed)
B LEs - ?etiology Gabapentin  - rash Use Tramadol prn B complex

## 2020-07-02 NOTE — Patient Instructions (Signed)
Neuropathic Pain Neuropathic pain is pain caused by damage to the nerves that are responsible for certain sensations in your body (sensory nerves). The pain can be caused by:  Damage to the sensory nerves that send signals to your spinal cord and brain (peripheral nervous system).  Damage to the sensory nerves in your brain or spinal cord (central nervous system). Neuropathic pain can make you more sensitive to pain. Even a minor sensation can feel very painful. This is usually a long-term condition that can be difficult to treat. The type of pain differs from person to person. It may:  Start suddenly (acute), or it may develop slowly and last for a long time (chronic).  Come and go as damaged nerves heal, or it may stay at the same level for years.  Cause emotional distress, loss of sleep, and a lower quality of life. What are the causes? The most common cause of this condition is diabetes. Many other diseases and conditions can also cause neuropathic pain. Causes of neuropathic pain can be classified as:  Toxic. This is caused by medicines and chemicals. The most common cause of toxic neuropathic pain is damage from cancer treatments (chemotherapy).  Metabolic. This can be caused by: ? Diabetes. This is the most common disease that damages the nerves. ? Lack of vitamin B from long-term alcohol abuse.  Traumatic. Any injury that cuts, crushes, or stretches a nerve can cause damage and pain. A common example is feeling pain after losing an arm or leg (phantom limb pain).  Compression-related. If a sensory nerve gets trapped or compressed for a long period of time, the blood supply to the nerve can be cut off.  Vascular. Many blood vessel diseases can cause neuropathic pain by decreasing blood supply and oxygen to nerves.  Autoimmune. This type of pain results from diseases in which the body's defense system (immune system) mistakenly attacks sensory nerves. Examples of autoimmune diseases  that can cause neuropathic pain include lupus and multiple sclerosis.  Infectious. Many types of viral infections can damage sensory nerves and cause pain. Shingles infection is a common cause of this type of pain.  Inherited. Neuropathic pain can be a symptom of many diseases that are passed down through families (genetic). What increases the risk? You are more likely to develop this condition if:  You have diabetes.  You smoke.  You drink too much alcohol.  You are taking certain medicines, including medicines that kill cancer cells (chemotherapy) or that treat immune system disorders. What are the signs or symptoms? The main symptom is pain. Neuropathic pain is often described as:  Burning.  Shock-like.  Stinging.  Hot or cold.  Itching. How is this diagnosed? No single test can diagnose neuropathic pain. It is diagnosed based on:  Physical exam and your symptoms. Your health care provider will ask you about your pain. You may be asked to use a pain scale to describe how bad your pain is.  Tests. These may be done to see if you have a high sensitivity to pain and to help find the cause and location of any sensory nerve damage. They include: ? Nerve conduction studies to test how well nerve signals travel through your sensory nerves (electrodiagnostic testing). ? Stimulating your sensory nerves through electrodes on your skin and measuring the response in your spinal cord and brain (somatosensory evoked potential).  Imaging studies, such as: ? X-rays. ? CT scan. ? MRI. How is this treated? Treatment for neuropathic pain may change   over time. You may need to try different treatment options or a combination of treatments. Some options include:  Treating the underlying cause of the neuropathy, such as diabetes, kidney disease, or vitamin deficiencies.  Stopping medicines that can cause neuropathy, such as chemotherapy.  Medicine to relieve pain. Medicines may  include: ? Prescription or over-the-counter pain medicine. ? Anti-seizure medicine. ? Antidepressant medicines. ? Pain-relieving patches that are applied to painful areas of skin. ? A medicine to numb the area (local anesthetic), which can be injected as a nerve block.  Transcutaneous nerve stimulation. This uses electrical currents to block painful nerve signals. The treatment is painless.  Alternative treatments, such as: ? Acupuncture. ? Meditation. ? Massage. ? Physical therapy. ? Pain management programs. ? Counseling. Follow these instructions at home: Medicines   Take over-the-counter and prescription medicines only as told by your health care provider.  Do not drive or use heavy machinery while taking prescription pain medicine.  If you are taking prescription pain medicine, take actions to prevent or treat constipation. Your health care provider may recommend that you: ? Drink enough fluid to keep your urine pale yellow. ? Eat foods that are high in fiber, such as fresh fruits and vegetables, whole grains, and beans. ? Limit foods that are high in fat and processed sugars, such as fried or sweet foods. ? Take an over-the-counter or prescription medicine for constipation. Lifestyle   Have a good support system at home.  Consider joining a chronic pain support group.  Do not use any products that contain nicotine or tobacco, such as cigarettes and e-cigarettes. If you need help quitting, ask your health care provider.  Do not drink alcohol. General instructions  Learn as much as you can about your condition.  Work closely with all your health care providers to find the treatment plan that works best for you.  Ask your health care provider what activities are safe for you.  Keep all follow-up visits as told by your health care provider. This is important. Contact a health care provider if:  Your pain treatments are not working.  You are having side effects  from your medicines.  You are struggling with tiredness (fatigue), mood changes, depression, or anxiety. Summary  Neuropathic pain is pain caused by damage to the nerves that are responsible for certain sensations in your body (sensory nerves).  Neuropathic pain may come and go as damaged nerves heal, or it may stay at the same level for years.  Neuropathic pain is usually a long-term condition that can be difficult to treat. Consider joining a chronic pain support group. This information is not intended to replace advice given to you by your health care provider. Make sure you discuss any questions you have with your health care provider. Document Revised: 03/07/2019 Document Reviewed: 12/01/2017 Elsevier Patient Education  2020 [employer]  

## 2020-07-02 NOTE — Progress Notes (Signed)
Subjective:  Patient ID: Douglas Christensen, male    DOB: [birth year]  Age: 69 y.o. MRN: [id number]  CC: No chief complaint on file.   HPI ARDELL MAKAREWICZ presents for numbness in toes - worse w/wearing shoes, driving x 6-8 months - getting worse S/p pacemaker placement  Outpatient Medications Prior to Visit  Medication Sig Dispense Refill  . acetaminophen (TYLENOL) 650 MG CR tablet Take 1,300 mg by mouth every 8 (eight) hours as needed for pain.    . cholecalciferol (VITAMIN D) 1000 units tablet Take 1,000 Units by mouth daily.     . clindamycin (CLINDAGEL) 1 % gel Apply 1 application topically 2 (two) times daily.     . clonazePAM (KLONOPIN) 0.5 MG tablet TAKE ONE TABLET TWICE DAILY AS NEEDED FOR ANXIETY 60 tablet 3  . hydrALAZINE (APRESOLINE) 50 MG tablet TAKE 1 tablet BY MOUTH THREE TIMES DAILY 270 tablet 3  . losartan (COZAAR) 25 MG tablet Take 1 tablet (25 mg total) by mouth daily. Take 50 mg in the morning and 25 mg PM 180 tablet 3  . metoprolol succinate (TOPROL XL) 25 MG 24 hr tablet Take 1 tablet (25 mg total) by mouth daily. 90 tablet 3  . potassium chloride (KLOR-CON) 8 MEQ tablet Take 1 tablet (8 mEq total) by mouth daily. 90 tablet 3  . rosuvastatin (CRESTOR) 40 MG tablet Take 1 tablet (40 mg total) by mouth daily. 90 tablet 2  . tadalafil (CIALIS) 20 MG tablet Take 20 mg by mouth daily as needed for erectile dysfunction.    . traMADol (ULTRAM) 50 MG tablet Take 50 mg by mouth every 6 (six) hours as needed (pain).     . triamterene-hydrochlorothiazide (MAXZIDE-25) 37.5-25 MG tablet TAKE TWO TABLETS BY MOUTH EVERY DAY 60 tablet 11  . vitamin B-12 (CYANOCOBALAMIN) 1000 MCG tablet Take 1,000 mcg by mouth daily.     No facility-administered medications prior to visit.    ROS: Review of Systems  Constitutional: Positive for unexpected weight change. Negative for appetite change and fatigue.  HENT: Negative for congestion, nosebleeds, sneezing, sore throat and trouble swallowing.     Eyes: Negative for itching and visual disturbance.  Respiratory: Negative for cough.   Cardiovascular: Negative for chest pain, palpitations and leg swelling.  Gastrointestinal: Negative for abdominal distention, blood in stool, diarrhea and nausea.  Genitourinary: Negative for frequency and hematuria.  Musculoskeletal: Positive for arthralgias and gait problem. Negative for back pain, joint swelling and neck pain.  Skin: Negative for rash.  Neurological: Positive for numbness. Negative for dizziness, tremors, speech difficulty and weakness.  Psychiatric/Behavioral: Negative for agitation, dysphoric mood, sleep disturbance and suicidal ideas. The patient is nervous/anxious.     Objective:  BP 118/60 (BP Location: Right Arm, Patient Position: Sitting, Cuff Size: Large)   Pulse 77   Temp 98.3 F (36.8 C) (Oral)   Ht 5\' 4"  (1.626 m)   Wt 200 lb (90.7 kg)   SpO2 97%   BMI 34.33 kg/m   BP Readings from Last 3 Encounters:  07/02/20 118/60  06/30/20 (!) 156/72  05/29/20 134/70    Wt Readings from Last 3 Encounters:  07/02/20 200 lb (90.7 kg)  06/30/20 197 lb 6.4 oz (89.5 kg)  05/29/20 196 lb (88.9 kg)    Physical Exam Constitutional:      General: He is not in acute distress.    Appearance: He is well-developed. He is obese.     Comments: NAD  Eyes:  Conjunctiva/sclera: Conjunctivae normal.     Pupils: Pupils are equal, round, and reactive to light.  Neck:     Thyroid: No thyromegaly.     Vascular: No JVD.  Cardiovascular:     Rate and Rhythm: Normal rate and regular rhythm.     Heart sounds: Normal heart sounds. No murmur heard.  No friction rub. No gallop.   Pulmonary:     Effort: Pulmonary effort is normal. No respiratory distress.     Breath sounds: Normal breath sounds. No wheezing or rales.  Chest:     Chest wall: No tenderness.  Abdominal:     General: Bowel sounds are normal. There is no distension.     Palpations: Abdomen is soft. There is no mass.      Tenderness: There is no abdominal tenderness. There is no guarding or rebound.  Musculoskeletal:        General: No tenderness. Normal range of motion.     Cervical back: Normal range of motion.  Lymphadenopathy:     Cervical: No cervical adenopathy.  Skin:    General: Skin is warm and dry.     Findings: No rash.  Neurological:     Mental Status: He is alert and oriented to person, place, and time.     Cranial Nerves: No cranial nerve deficit.     Motor: No abnormal muscle tone.     Coordination: Coordination normal.     Gait: Gait normal.     Deep Tendon Reflexes: Reflexes are normal and symmetric.  Psychiatric:        Behavior: Behavior normal.        Thought Content: Thought content normal.        Judgment: Judgment normal.   feet - warm, good pulses  Lab Results  Component Value Date   WBC 8.5 05/29/2020   HGB 12.6 (L) 05/29/2020   HCT 37.0 (L) 05/29/2020   PLT 228 05/29/2020   GLUCOSE 98 05/29/2020   CHOL 134 04/02/2019   TRIG 214 (H) 04/02/2019   HDL 40 04/02/2019   LDLDIRECT 148.0 06/07/2018   LDLCALC 51 04/02/2019   ALT 29 04/20/2020   AST 28 04/20/2020   NA 136 05/29/2020   K 4.3 05/29/2020   CL 101 05/29/2020   CREATININE 1.24 05/29/2020   BUN 28 (H) 05/29/2020   CO2 20 05/29/2020   TSH 2.60 03/20/2018   PSA 0.87 03/06/2017   INR 1.10 06/22/2015   HGBA1C 5.7 03/06/2017    VAS [place] ABI WITH/WO TBI  Result Date: 06/30/2020 LOWER EXTREMITY DOPPLER STUDY Indications: Peripheral artery disease. High Risk Factors: Hypertension, hyperlipidemia, coronary artery disease.  Vascular Interventions: 08/06/2019 Right CIA/EIA arterectomy and stenting; right                         popliteal artery artherectomy and stenting                          12/11/2012 Right femoral to [place] popliteal bypass; Left                         EIA, CFA, Profunda and SFA endarterectomy and                         angioplasty  01/29/2015 left superficial femoral stent                           2008 right SFA stent (occluded). Performing Technologist: Alvia Grove RVT  Examination Guidelines: A complete evaluation includes at minimum, Doppler waveform signals and systolic blood pressure reading at the level of bilateral brachial, anterior tibial, and posterior tibial arteries, when vessel segments are accessible. Bilateral testing is considered an integral part of a complete examination. Photoelectric Plethysmograph (PPG) waveforms and toe systolic pressure readings are included as required and additional duplex testing as needed. Limited examinations for reoccurring indications may be performed as noted.  ABI Findings: +---------+------------------+-----+---------+--------+ Right    Rt Pressure (mmHg)IndexWaveform Comment  +---------+------------------+-----+---------+--------+ Brachial 128                                      +---------+------------------+-----+---------+--------+ PTA      127               0.98 triphasic         +---------+------------------+-----+---------+--------+ DP       144               1.12 triphasic         +---------+------------------+-----+---------+--------+ Great Toe124               0.96                   +---------+------------------+-----+---------+--------+ +---------+------------------+-----+---------+-------+ Left     Lt Pressure (mmHg)IndexWaveform Comment +---------+------------------+-----+---------+-------+ Brachial 129                                     +---------+------------------+-----+---------+-------+ PTA      131               1.02 triphasic        +---------+------------------+-----+---------+-------+ DP       130               1.01 triphasic        +---------+------------------+-----+---------+-------+ Great Toe118               0.91 Normal           +---------+------------------+-----+---------+-------+ +-------+-----------+-----------+------------+------------+  ABI/TBIToday's ABIToday's TBIPrevious ABIPrevious TBI +-------+-----------+-----------+------------+------------+ Right  1.12       0.96       1.10        0.66         +-------+-----------+-----------+------------+------------+ Left   1.02       0.91       0.83        0.84         +-------+-----------+-----------+------------+------------+  Summary: Right: Resting right ankle-brachial index is within normal range. No evidence of significant right lower extremity arterial disease. The right toe-brachial index is normal. Left: Resting left ankle-brachial index is within normal range. No evidence of significant left lower extremity arterial disease. The left toe-brachial index is normal.  *See table(s) above for measurements and observations.  Electronically signed by Curt Jews MD on 06/30/2020 at 38:47:12 AM.    Final    VAS US AORTA/IVC/ILIACS  Result Date: 06/30/2020 ABDOMINAL AORTA STUDY Indications: Right stent evaluation Risk Factors: Hypertension, hyperlipidemia, coronary artery disease. Vascular Interventions: 08/06/2019 Right CIA/EIA arterectomy and stenting; right  popliteal artery artherectomy and stenting                          12/11/2012 Right femoral to [place] popliteal bypass; Left                         EIA, CFA, Profunda and SFA endarterectomy and                         angioplasty                          01/29/2015 left superficial femoral stent                          2008 right SFA stent (occluded). Limitations: Air/bowel gas, obesity, patient discomfort and breathing artifact.  Performing Technologist: Alvia Grove RVT  Examination Guidelines: A complete evaluation includes B-mode imaging, spectral Doppler, color Doppler, and power Doppler as needed of all accessible portions of each vessel. Bilateral testing is considered an integral part of a complete examination. Limited examinations for reoccurring indications may be performed as noted.  Abdominal Aorta  Findings: +-------------+-------+----------+-------------+--------+--------+--------+ Location     AP (cm)Trans (cm)PSV (cm/s)   WaveformThrombusComments +-------------+-------+----------+-------------+--------+--------+--------+ Proximal                      77           biphasic                 +-------------+-------+----------+-------------+--------+--------+--------+ Mid                           52           biphasic                 +-------------+-------+----------+-------------+--------+--------+--------+ Distal                        55           biphasic                 +-------------+-------+----------+-------------+--------+--------+--------+ RT CIA Prox                   58           biphasic                 +-------------+-------+----------+-------------+--------+--------+--------+ RT CIA Distal                 86                                    +-------------+-------+----------+-------------+--------+--------+--------+ RT EIA Prox                   98           biphasic                 +-------------+-------+----------+-------------+--------+--------+--------+ RT EIA Mid                    92           biphasic                 +-------------+-------+----------+-------------+--------+--------+--------+  RT EIA Distal                 77/ 371      biphasic        ? stent  +-------------+-------+----------+-------------+--------+--------+--------+ LT CIA Prox                   61           biphasic                 +-------------+-------+----------+-------------+--------+--------+--------+ LT CIA Mid                    75           biphasic                 +-------------+-------+----------+-------------+--------+--------+--------+ LT CIA Distal                 58           biphasic                 +-------------+-------+----------+-------------+--------+--------+--------+ LT EIA Mid                    461 /  62 / 41biphasic                 +-------------+-------+----------+-------------+--------+--------+--------+ LT EIA Distal                 254          biphasic                 +-------------+-------+----------+-------------+--------+--------+--------+ Right common femoral artery 196 cm/s biphasic waveform. Left common femoral artery 51 cm/s biphasic waveform.  Summary: Suboptimal exam, further testing modality may be warranted. Increased velocity in what appears to be the right distal EIA and Left EIA suggests 50 - 99% stenosis range. Unable to thoroughly visualize arteries / stent walls.  *See table(s) above for measurements and observations.  Electronically signed by Curt Jews MD on 06/30/2020 at 1:02:49 PM.    Final     Assessment & Plan:   There are no diagnoses linked to this encounter.   No orders of the defined types were placed in this encounter.    Follow-up: No follow-ups on file.  [name], MD

## 2020-07-02 NOTE — Addendum Note (Signed)
Addended by: Darlys Gales on: 07/02/2020 03:42 PM   Modules accepted: Orders

## 2020-07-06 ENCOUNTER — Inpatient Hospital Stay: Payer: PPO

## 2020-07-06 ENCOUNTER — Inpatient Hospital Stay: Payer: PPO | Admitting: Hematology & Oncology

## 2020-07-07 ENCOUNTER — Encounter (HOSPITAL_COMMUNITY): Payer: PPO

## 2020-07-07 ENCOUNTER — Ambulatory Visit: Payer: PPO

## 2020-07-17 ENCOUNTER — Inpatient Hospital Stay: Payer: PPO

## 2020-07-17 ENCOUNTER — Inpatient Hospital Stay: Payer: PPO | Attending: Hematology & Oncology | Admitting: Hematology & Oncology

## 2020-07-17 ENCOUNTER — Telehealth: Payer: Self-pay | Admitting: Hematology & Oncology

## 2020-07-17 ENCOUNTER — Other Ambulatory Visit: Payer: Self-pay

## 2020-07-17 ENCOUNTER — Encounter: Payer: Self-pay | Admitting: Hematology & Oncology

## 2020-07-17 VITALS — BP 144/63 | HR 68 | Temp 97.9°F | Resp 19 | Ht 64.0 in | Wt 202.0 lb

## 2020-07-17 DIAGNOSIS — D751 Secondary polycythemia: Secondary | ICD-10-CM

## 2020-07-17 DIAGNOSIS — E611 Iron deficiency: Secondary | ICD-10-CM | POA: Insufficient documentation

## 2020-07-17 DIAGNOSIS — F17208 Nicotine dependence, unspecified, with other nicotine-induced disorders: Secondary | ICD-10-CM | POA: Insufficient documentation

## 2020-07-17 DIAGNOSIS — Z7902 Long term (current) use of antithrombotics/antiplatelets: Secondary | ICD-10-CM | POA: Diagnosis not present

## 2020-07-17 DIAGNOSIS — Z950 Presence of cardiac pacemaker: Secondary | ICD-10-CM | POA: Diagnosis not present

## 2020-07-17 LAB — IRON AND TIBC
Iron: 73 ug/dL (ref 42–163)
Saturation Ratios: 21 % (ref 20–55)
TIBC: 344 ug/dL (ref 202–409)
UIBC: 271 ug/dL (ref 117–376)

## 2020-07-17 LAB — CBC WITH DIFFERENTIAL (CANCER CENTER ONLY)
Abs Immature Granulocytes: 0.05 10*3/uL (ref 0.00–0.07)
Basophils Absolute: 0.1 10*3/uL (ref 0.0–0.1)
Basophils Relative: 1 %
Eosinophils Absolute: 0.5 10*3/uL (ref 0.0–0.5)
Eosinophils Relative: 6 %
HCT: 37.8 % — ABNORMAL LOW (ref 39.0–52.0)
Hemoglobin: 13 g/dL (ref 13.0–17.0)
Immature Granulocytes: 1 %
Lymphocytes Relative: 33 %
Lymphs Abs: 2.5 10*3/uL (ref 0.7–4.0)
MCH: 33.6 pg (ref 26.0–34.0)
MCHC: 34.4 g/dL (ref 30.0–36.0)
MCV: 97.7 fL (ref 80.0–100.0)
Monocytes Absolute: 0.7 10*3/uL (ref 0.1–1.0)
Monocytes Relative: 9 %
Neutro Abs: 3.9 10*3/uL (ref 1.7–7.7)
Neutrophils Relative %: 50 %
Platelet Count: 221 10*3/uL (ref 150–400)
RBC: 3.87 MIL/uL — ABNORMAL LOW (ref 4.22–5.81)
RDW: 12.6 % (ref 11.5–15.5)
WBC Count: 7.6 10*3/uL (ref 4.0–10.5)
nRBC: 0 % (ref 0.0–0.2)

## 2020-07-17 LAB — CMP (CANCER CENTER ONLY)
ALT: 26 U/L (ref 0–44)
AST: 25 U/L (ref 15–41)
Albumin: 4.5 g/dL (ref 3.5–5.0)
Alkaline Phosphatase: 44 U/L (ref 38–126)
Anion gap: 10 (ref 5–15)
BUN: 33 mg/dL — ABNORMAL HIGH (ref 8–23)
CO2: 23 mmol/L (ref 22–32)
Calcium: 9.6 mg/dL (ref 8.9–10.3)
Chloride: 105 mmol/L (ref 98–111)
Creatinine: 1.68 mg/dL — ABNORMAL HIGH (ref 0.61–1.24)
GFR, Est AFR Am: 47 mL/min — ABNORMAL LOW (ref >60)
GFR, Estimated: 41 mL/min — ABNORMAL LOW (ref >60)
Glucose, Bld: 112 mg/dL — ABNORMAL HIGH (ref 70–99)
Potassium: 3.9 mmol/L (ref 3.5–5.1)
Sodium: 138 mmol/L (ref 135–145)
Total Bilirubin: 0.4 mg/dL (ref 0.3–1.2)
Total Protein: 7.2 g/dL (ref 6.5–8.1)

## 2020-07-17 LAB — FERRITIN: Ferritin: 93 ng/mL (ref 24–336)

## 2020-07-17 NOTE — Progress Notes (Signed)
Hematology and Oncology Follow Up Visit  Douglas Christensen [id number] 03-07-1951 69 y.o. 07/17/2020   Principle Diagnosis:  Secondarypolycythemia,due to smoking/sleep apnea  Current Therapy:   Phlebotomy to keep Hct < 45% Brilinta 90 mg po BID   Interim History:  Douglas Christensen is here today for follow-up.  Overall, he has been doing quite well.  We last saw in March.  His pacemaker, that was put in in late December is doing well.  He has been to the beach a couple times.  He is doing his work in [place].  We have not had to phlebotomize him for about 8 months now.  His hemoglobin has been nice and low and stable.  He has had no problem with headaches.  He has had no bleeding.  He is on Brilinta.  There is been no change in bowel or bladder habits.  He has had no leg swelling.  He has had no rashes.  There has been no cough or shortness of breath.  He has had his coronavirus vaccination.    Overall, his performance status is ECOG 0.    Medications:  Allergies as of 07/17/2020      Reactions   Roxicodone [oxycodone Hcl] Other (See Comments)   hallucinations   Benazepril Cough   Gabapentin Rash   Itraconazole Nausea Only, Rash   Lipitor [atorvastatin Calcium]    cramps      Medication List       Accurate as of July 17, 2020  9:08 AM. If you have any questions, ask your nurse or doctor.        acetaminophen 650 MG CR tablet Commonly known as: TYLENOL Take 1,300 mg by mouth every 8 (eight) hours as needed for pain.   cholecalciferol 1000 units tablet Commonly known as: VITAMIN D Take 1,000 Units by mouth daily.   clindamycin 1 % gel Commonly known as: CLINDAGEL Apply 1 application topically 2 (two) times daily.   clonazePAM 0.5 MG tablet Commonly known as: KLONOPIN TAKE ONE TABLET TWICE DAILY AS NEEDED FOR ANXIETY   hydrALAZINE 50 MG tablet Commonly known as: APRESOLINE TAKE 1 tablet BY MOUTH THREE TIMES DAILY   losartan 25 MG tablet Commonly known as:  COZAAR Take 1 tablet (25 mg total) by mouth daily. Take 50 mg in the morning and 25 mg PM   metoprolol succinate 25 MG 24 hr tablet Commonly known as: Toprol XL Take 1 tablet (25 mg total) by mouth daily.   potassium chloride 8 MEQ tablet Commonly known as: KLOR-CON Take 1 tablet (8 mEq total) by mouth daily.   rosuvastatin 40 MG tablet Commonly known as: CRESTOR Take 1 tablet (40 mg total) by mouth daily.   tadalafil 20 MG tablet Commonly known as: CIALIS Take 20 mg by mouth daily as needed for erectile dysfunction.   traMADol 50 MG tablet Commonly known as: ULTRAM Take 0.5-1 tablets (25-50 mg total) by mouth every 6 (six) hours as needed for severe pain (pain).   triamterene-hydrochlorothiazide 37.5-25 MG tablet Commonly known as: MAXZIDE-25 TAKE TWO TABLETS BY MOUTH EVERY DAY   vitamin B-12 1000 MCG tablet Commonly known as: CYANOCOBALAMIN Take 1,000 mcg by mouth daily.       Allergies:  Allergies  Allergen Reactions  . Roxicodone [Oxycodone Hcl] Other (See Comments)    hallucinations  . Benazepril Cough  . Gabapentin Rash  . Itraconazole Nausea Only and Rash  . Lipitor [Atorvastatin Calcium]     cramps    Past  Medical History, Surgical history, Social history, and Family History were reviewed and updated.  Review of Systems: Review of Systems  Constitutional: Negative.   HENT: Negative.   Eyes: Negative.   Respiratory: Negative.   Cardiovascular: Negative.   Gastrointestinal: Negative.   Genitourinary: Negative.   Musculoskeletal: Negative.   Skin: Negative.  Negative for rash.  Neurological: Negative.   Endo/Heme/Allergies: Negative.   Psychiatric/Behavioral: Negative.      Physical Exam:  height is 5\' 4"  (1.626 m) and weight is 202 lb (91.6 kg). His oral temperature is 97.9 F (36.6 C). His blood pressure is 144/63 (abnormal) and his pulse is 68. His respiration is 19 and oxygen saturation is 98%.   Wt Readings from Last 3 Encounters:   07/17/20 202 lb (91.6 kg)  07/02/20 200 lb (90.7 kg)  06/30/20 197 lb 6.4 oz (89.5 kg)    Physical Exam Vitals reviewed.  HENT:     Head: Normocephalic and atraumatic.  Eyes:     Pupils: Pupils are equal, round, and reactive to light.  Cardiovascular:     Rate and Rhythm: Normal rate and regular rhythm.     Heart sounds: Normal heart sounds.  Pulmonary:     Effort: Pulmonary effort is normal.     Breath sounds: Normal breath sounds.  Abdominal:     General: Bowel sounds are normal.     Palpations: Abdomen is soft.  Musculoskeletal:        General: No tenderness or deformity. Normal range of motion.     Cervical back: Normal range of motion.  Lymphadenopathy:     Cervical: No cervical adenopathy.  Skin:    General: Skin is warm and dry.     Findings: No erythema or rash.  Neurological:     Mental Status: He is alert and oriented to person, place, and time.  Psychiatric:        Behavior: Behavior normal.        Thought Content: Thought content normal.        Judgment: Judgment normal.      Lab Results  Component Value Date   WBC 7.6 07/17/2020   HGB 13.0 07/17/2020   HCT 37.8 (L) 07/17/2020   MCV 97.7 07/17/2020   PLT 221 07/17/2020   Lab Results  Component Value Date   FERRITIN 123 02/03/2020   IRON 91 02/03/2020   TIBC 335 02/03/2020   UIBC 244 02/03/2020   IRONPCTSAT 27 02/03/2020   Lab Results  Component Value Date   RETICCTPCT 2.1 (H) 07/13/2018   RBC 3.87 (L) 07/17/2020   No results found for: Nils Pyle [facility] Lab Results  Component Value Date   IGGSERUM 858 05/01/2008   IGMSERUM 134 05/01/2008   No results found for: Odetta Pink, SPEI   Chemistry      Component Value Date/Time   NA 138 07/17/2020 0818   NA 136 05/29/2020 1233   K 3.9 07/17/2020 0818   CL 105 07/17/2020 0818   CO2 23 07/17/2020 0818   BUN 33 (H) 07/17/2020 0818   BUN 28 (H) 05/29/2020 1233    CREATININE 1.68 (H) 07/17/2020 0818      Component Value Date/Time   CALCIUM 9.6 07/17/2020 0818   ALKPHOS 44 07/17/2020 0818   AST 25 07/17/2020 0818   ALT 26 07/17/2020 0818   BILITOT 0.4 07/17/2020 0818       Impression and Plan: Douglas Christensen is a very pleasant 69 yo  caucasian withsecondarypolycythemia.   He is done really well.  I am sure that a lot of this is because his iron is low.  He is not smoking which is always a good thing.  I think we now get him back after the holidays.  His hemoglobin and hematocrit were going up slowly.  As such, it might be time in early part of 2022 for [place] to phlebotomize him.  His big event this fall is the oyster roast down in [facility].  This is right after Thanksgiving.    Volanda Napoleon, MD 8/20/20219:08 AM

## 2020-07-17 NOTE — Telephone Encounter (Signed)
Appointments scheduled and patient requested to get update from My Chart per 8/20 los

## 2020-07-29 ENCOUNTER — Ambulatory Visit (HOSPITAL_COMMUNITY)
Admission: RE | Admit: 2020-07-29 | Payer: PPO | Source: Ambulatory Visit | Attending: Physician Assistant | Admitting: Physician Assistant

## 2020-07-31 DIAGNOSIS — G4733 Obstructive sleep apnea (adult) (pediatric): Secondary | ICD-10-CM | POA: Diagnosis not present

## 2020-08-07 ENCOUNTER — Ambulatory Visit (HOSPITAL_COMMUNITY)
Admission: RE | Admit: 2020-08-07 | Discharge: 2020-08-07 | Disposition: A | Discharge status: Home / Self Care | Payer: PPO | Source: Ambulatory Visit | Attending: Cardiology | Admitting: Cardiology

## 2020-08-07 ENCOUNTER — Other Ambulatory Visit: Payer: Self-pay

## 2020-08-07 ENCOUNTER — Other Ambulatory Visit (HOSPITAL_COMMUNITY): Payer: Self-pay | Admitting: Physician Assistant

## 2020-08-07 DIAGNOSIS — I6523 Occlusion and stenosis of bilateral carotid arteries: Secondary | ICD-10-CM | POA: Diagnosis not present

## 2020-08-10 ENCOUNTER — Encounter: Payer: Self-pay | Admitting: Physician Assistant

## 2020-08-10 ENCOUNTER — Telehealth: Payer: Self-pay

## 2020-08-10 NOTE — Telephone Encounter (Signed)
I called and left patient a detailed message with ultrasound results. Asked patient to call back to confirm if he is taking Aspirin, Plavix, or Brilinta.

## 2020-08-10 NOTE — Telephone Encounter (Signed)
-----   Message from Liliane Shi, [place] sent at 08/10/2020  8:36 AM EDT ----- The Carotid ultrasound shows stable findings compared with the study in 2020.   PLAN:   -Continue current medications/treatment plan and follow up as scheduled.  -Med list no longer shows Brilinta (Ticagrelor) - please ask patient if he is taking ASA, Brilinta or Plavix.  -Please send a copy to his PCP. Richardson Dopp, PA-C    08/10/2020 8:30 AM

## 2020-08-14 NOTE — Telephone Encounter (Signed)
I called and spoke with patient he is taking Aspirin 81 mg, Dr. Trula Slade advised patient to stop Brilinta and just take the Aspirin. Patient states that his shortness of breath has been 100% better since stopping Brilinta. Patient also states that the allergy listed to Plavix is not correct, he does not remember having an allergy to Plavix.

## 2020-08-18 NOTE — Telephone Encounter (Signed)
Ok.  I reviewed his chart.  From a cardiac standpoint, he just needs to be on ASA. Continue ASA 81 mg once daily. Richardson Dopp, PA-C    08/18/2020 5:01 PM

## 2020-08-19 NOTE — Telephone Encounter (Signed)
I called and spoke with patient, he is aware to continue Aspirin 81 mg 1 tablet by mouth once a day. Aspirin 81 mg added to patients medication list.

## 2020-08-30 DIAGNOSIS — G4733 Obstructive sleep apnea (adult) (pediatric): Secondary | ICD-10-CM | POA: Diagnosis not present

## 2020-09-14 ENCOUNTER — Other Ambulatory Visit: Payer: Self-pay | Admitting: Cardiovascular Disease

## 2020-09-29 ENCOUNTER — Telehealth: Payer: Self-pay

## 2020-09-29 ENCOUNTER — Ambulatory Visit (INDEPENDENT_AMBULATORY_CARE_PROVIDER_SITE_OTHER): Payer: PPO

## 2020-09-29 DIAGNOSIS — I442 Atrioventricular block, complete: Secondary | ICD-10-CM | POA: Diagnosis not present

## 2020-09-29 NOTE — Telephone Encounter (Signed)
Patient called in and has successfully sent his transmission he has not had one since 02/27/2020. I let patient know a nurse will look over it and call him if they need to and if everything looks good they will not give him a call

## 2020-09-29 NOTE — Telephone Encounter (Signed)
Transmission reviewed, device function normal, no arrythmia episodes.  Pt reports his fitbit has picked up HR up to 113 at rest.  Noted pt daily HR averaging from 80-110, no sustained arrythmias noted, therefore likely ST.

## 2020-09-29 NOTE — Telephone Encounter (Signed)
The pt states he was having trouble with the [employer] app. I asked him to start all over again. I gave him the number to the device clinic to get help. I told him to also try the [employer] app.

## 2020-09-30 LAB — CUP PACEART REMOTE DEVICE CHECK
Battery Remaining Longevity: 134 mo
Battery Voltage: 3.07 V
Brady Statistic AP VP Percent: 29.51 %
Brady Statistic AP VS Percent: 0 %
Brady Statistic AS VP Percent: 67.69 %
Brady Statistic AS VS Percent: 2.79 %
Brady Statistic RA Percent Paced: 30.24 %
Brady Statistic RV Percent Paced: 97.21 %
Date Time Interrogation Session: 20211102150049
Implantable Lead Implant Date: 20201231
Implantable Lead Implant Date: 20201231
Implantable Lead Location: 753859
Implantable Lead Location: 753860
Implantable Lead Model: 3830
Implantable Lead Model: 5076
Implantable Pulse Generator Implant Date: 20201231
Lead Channel Impedance Value: 361 Ohm
Lead Channel Impedance Value: 437 Ohm
Lead Channel Impedance Value: 456 Ohm
Lead Channel Impedance Value: 627 Ohm
Lead Channel Pacing Threshold Amplitude: 0.625 V
Lead Channel Pacing Threshold Amplitude: 0.75 V
Lead Channel Pacing Threshold Pulse Width: 0.4 ms
Lead Channel Pacing Threshold Pulse Width: 0.4 ms
Lead Channel Sensing Intrinsic Amplitude: 5.75 mV
Lead Channel Sensing Intrinsic Amplitude: 5.75 mV
Lead Channel Sensing Intrinsic Amplitude: 8.5 mV
Lead Channel Sensing Intrinsic Amplitude: 8.5 mV
Lead Channel Setting Pacing Amplitude: 1.5 V
Lead Channel Setting Pacing Amplitude: 2.5 V
Lead Channel Setting Pacing Pulse Width: 0.4 ms
Lead Channel Setting Sensing Sensitivity: 1.2 mV

## 2020-10-01 NOTE — Progress Notes (Signed)
Remote pacemaker transmission.   

## 2020-10-05 ENCOUNTER — Other Ambulatory Visit (INDEPENDENT_AMBULATORY_CARE_PROVIDER_SITE_OTHER): Payer: PPO

## 2020-10-05 ENCOUNTER — Ambulatory Visit: Payer: PPO | Admitting: Internal Medicine

## 2020-10-05 DIAGNOSIS — Z125 Encounter for screening for malignant neoplasm of prostate: Secondary | ICD-10-CM | POA: Diagnosis not present

## 2020-10-05 DIAGNOSIS — G629 Polyneuropathy, unspecified: Secondary | ICD-10-CM

## 2020-10-05 DIAGNOSIS — Z0000 Encounter for general adult medical examination without abnormal findings: Secondary | ICD-10-CM | POA: Diagnosis not present

## 2020-10-05 LAB — URINALYSIS, ROUTINE W REFLEX MICROSCOPIC
Bilirubin Urine: NEGATIVE
Hgb urine dipstick: NEGATIVE
Ketones, ur: NEGATIVE
Nitrite: NEGATIVE
RBC / HPF: NONE SEEN (ref 0–?)
Specific Gravity, Urine: 1.015 (ref 1.000–1.030)
Total Protein, Urine: NEGATIVE
Urine Glucose: NEGATIVE
Urobilinogen, UA: 0.2 (ref 0.0–1.0)
pH: 5.5 (ref 5.0–8.0)

## 2020-10-05 LAB — CBC WITH DIFFERENTIAL/PLATELET
Basophils Absolute: 0.1 10*3/uL (ref 0.0–0.1)
Basophils Relative: 0.8 % (ref 0.0–3.0)
Eosinophils Absolute: 0.4 10*3/uL (ref 0.0–0.7)
Eosinophils Relative: 4 % (ref 0.0–5.0)
HCT: 41 % (ref 39.0–52.0)
Hemoglobin: 14 g/dL (ref 13.0–17.0)
Lymphocytes Relative: 25.1 % (ref 12.0–46.0)
Lymphs Abs: 2.3 10*3/uL (ref 0.7–4.0)
MCHC: 34.1 g/dL (ref 30.0–36.0)
MCV: 96.2 fl (ref 78.0–100.0)
Monocytes Absolute: 0.7 10*3/uL (ref 0.1–1.0)
Monocytes Relative: 8.1 % (ref 3.0–12.0)
Neutro Abs: 5.8 10*3/uL (ref 1.4–7.7)
Neutrophils Relative %: 62 % (ref 43.0–77.0)
Platelets: 247 10*3/uL (ref 150.0–400.0)
RBC: 4.27 Mil/uL (ref 4.22–5.81)
RDW: 13.4 % (ref 11.5–15.5)
WBC: 9.3 10*3/uL (ref 4.0–10.5)

## 2020-10-05 LAB — LIPID PANEL
Cholesterol: 132 mg/dL (ref 0–200)
HDL: 53.7 mg/dL (ref >39.00)
LDL Cholesterol: 46 mg/dL (ref 0–99)
NonHDL: 77.95
Total CHOL/HDL Ratio: 2
Triglycerides: 158 mg/dL — ABNORMAL HIGH (ref 0.0–149.0)
VLDL: 31.6 mg/dL (ref 0.0–40.0)

## 2020-10-05 LAB — TSH: TSH: 2.62 u[IU]/mL (ref 0.35–4.50)

## 2020-10-05 LAB — COMPREHENSIVE METABOLIC PANEL
ALT: 21 U/L (ref 0–53)
AST: 23 U/L (ref 0–37)
Albumin: 4.4 g/dL (ref 3.5–5.2)
Alkaline Phosphatase: 61 U/L (ref 39–117)
BUN: 38 mg/dL — ABNORMAL HIGH (ref 6–23)
CO2: 22 mEq/L (ref 19–32)
Calcium: 9.6 mg/dL (ref 8.4–10.5)
Chloride: 99 mEq/L (ref 96–112)
Creatinine, Ser: 2.48 mg/dL — ABNORMAL HIGH (ref 0.40–1.50)
GFR: 25.8 mL/min — ABNORMAL LOW (ref >60.00)
Glucose, Bld: 92 mg/dL (ref 70–99)
Potassium: 4.1 mEq/L (ref 3.5–5.1)
Sodium: 133 mEq/L — ABNORMAL LOW (ref 135–145)
Total Bilirubin: 0.4 mg/dL (ref 0.2–1.2)
Total Protein: 6.6 g/dL (ref 6.0–8.3)

## 2020-10-05 LAB — PSA: PSA: 0.86 ng/mL (ref 0.10–4.00)

## 2020-10-05 NOTE — Addendum Note (Signed)
Addended by: Trenda Moots on: 03/05/8890 10:00 AM   Modules accepted: Orders

## 2020-10-07 ENCOUNTER — Telehealth: Payer: Self-pay | Admitting: Internal Medicine

## 2020-10-07 NOTE — Telephone Encounter (Signed)
LVM for pt to rtn my call to schedule awv with [facility]. Please schedule appt if pt calls the office.

## 2020-10-13 ENCOUNTER — Encounter: Payer: Self-pay | Admitting: Internal Medicine

## 2020-10-13 ENCOUNTER — Ambulatory Visit (INDEPENDENT_AMBULATORY_CARE_PROVIDER_SITE_OTHER): Payer: PPO | Admitting: Internal Medicine

## 2020-10-13 ENCOUNTER — Other Ambulatory Visit: Payer: Self-pay

## 2020-10-13 VITALS — BP 138/70 | HR 82 | Temp 98.4°F | Wt 189.6 lb

## 2020-10-13 DIAGNOSIS — Z0000 Encounter for general adult medical examination without abnormal findings: Secondary | ICD-10-CM

## 2020-10-13 DIAGNOSIS — I2510 Atherosclerotic heart disease of native coronary artery without angina pectoris: Secondary | ICD-10-CM | POA: Diagnosis not present

## 2020-10-13 DIAGNOSIS — N184 Chronic kidney disease, stage 4 (severe): Secondary | ICD-10-CM

## 2020-10-13 DIAGNOSIS — E559 Vitamin D deficiency, unspecified: Secondary | ICD-10-CM

## 2020-10-13 DIAGNOSIS — F411 Generalized anxiety disorder: Secondary | ICD-10-CM

## 2020-10-13 DIAGNOSIS — E538 Deficiency of other specified B group vitamins: Secondary | ICD-10-CM

## 2020-10-13 DIAGNOSIS — Z0001 Encounter for general adult medical examination with abnormal findings: Secondary | ICD-10-CM

## 2020-10-13 MED ORDER — TRIAMTERENE-HCTZ 37.5-25 MG PO TABS
1.0000 | ORAL_TABLET | Freq: Every day | ORAL | 11 refills | Status: DC
Start: 2020-10-13 — End: 2020-11-16

## 2020-10-13 NOTE — Assessment & Plan Note (Signed)
Vit D 

## 2020-10-13 NOTE — Assessment & Plan Note (Addendum)
Worse GFR 25 US renal  Nephrol ref Reduce Maxzide to 1/d

## 2020-10-13 NOTE — Assessment & Plan Note (Signed)
Clonazepam - rare use prn ° Potential benefits of a long term benzodiazepines  use as well as potential risks  and complications were explained to the patient and were aknowledged. °

## 2020-10-13 NOTE — Assessment & Plan Note (Signed)
No CP 

## 2020-10-13 NOTE — Progress Notes (Signed)
Subjective:  Patient ID: Douglas Christensen, male    DOB: 1951-03-16  Age: 69 y.o. MRN: [id number]  CC: Annual Exam   HPI Douglas Christensen presents for a well exam C/o LBP, stiffness  Outpatient Medications Prior to Visit  Medication Sig Dispense Refill  . acetaminophen (TYLENOL) 650 MG CR tablet Take 1,300 mg by mouth every 8 (eight) hours as needed for pain.    Marland Kitchen aspirin EC 81 MG tablet Take 81 mg by mouth daily. Swallow whole.    . cholecalciferol (VITAMIN D) 1000 units tablet Take 1,000 Units by mouth daily.     . clindamycin (CLINDAGEL) 1 % gel Apply 1 application topically 2 (two) times daily.     . clonazePAM (KLONOPIN) 0.5 MG tablet TAKE ONE TABLET TWICE DAILY AS NEEDED FOR ANXIETY 60 tablet 3  . hydrALAZINE (APRESOLINE) 50 MG tablet TAKE 1 tablet BY MOUTH THREE TIMES DAILY 270 tablet 3  . losartan (COZAAR) 25 MG tablet Take 1 tablet (25 mg total) by mouth daily. Take 50 mg in the morning and 25 mg PM (Patient taking differently: Take 25 mg by mouth 2 (two) times daily. ) 180 tablet 3  . metoprolol succinate (TOPROL-XL) 25 MG 24 hr tablet Take 1 tablet (25 mg total) by mouth daily. 90 tablet 3  . potassium chloride (KLOR-CON) 8 MEQ tablet Take 1 tablet (8 mEq total) by mouth daily. 90 tablet 3  . rosuvastatin (CRESTOR) 40 MG tablet Take 1 tablet (40 mg total) by mouth daily. 90 tablet 2  . tadalafil (CIALIS) 20 MG tablet Take 20 mg by mouth daily as needed for erectile dysfunction.    . traMADol (ULTRAM) 50 MG tablet Take 0.5-1 tablets (25-50 mg total) by mouth every 6 (six) hours as needed for severe pain (pain). 100 tablet 1  . triamterene-hydrochlorothiazide (MAXZIDE-25) 37.5-25 MG tablet TAKE TWO TABLETS BY MOUTH EVERY DAY 60 tablet 11  . vitamin B-12 (CYANOCOBALAMIN) 1000 MCG tablet Take 1,000 mcg by mouth daily.     No facility-administered medications prior to visit.    ROS: Review of Systems  Constitutional: Positive for unexpected weight change. Negative for appetite change and  fatigue.  HENT: Negative for congestion, nosebleeds, sneezing, sore throat and trouble swallowing.   Eyes: Negative for itching and visual disturbance.  Respiratory: Negative for cough.   Cardiovascular: Negative for chest pain, palpitations and leg swelling.  Gastrointestinal: Negative for abdominal distention, blood in stool, diarrhea and nausea.  Genitourinary: Negative for frequency and hematuria.  Musculoskeletal: Positive for arthralgias and back pain. Negative for gait problem, joint swelling and neck pain.  Skin: Negative for rash.  Neurological: Negative for dizziness, tremors, speech difficulty and weakness.  Hematological: Bruises/bleeds easily.  Psychiatric/Behavioral: Negative for agitation, dysphoric mood and sleep disturbance. The patient is not nervous/anxious.     Objective:  BP 138/70 (BP Location: Left Arm)   Pulse 82   Temp 98.4 F (36.9 C) (Oral)   Wt 189 lb 9.6 oz (86 kg)   SpO2 98%   BMI 32.54 kg/m   BP Readings from Last 3 Encounters:  10/13/20 138/70  07/17/20 (!) 144/63  07/02/20 118/60    Wt Readings from Last 3 Encounters:  10/13/20 189 lb 9.6 oz (86 kg)  07/17/20 202 lb (91.6 kg)  07/02/20 200 lb (90.7 kg)    Physical Exam Constitutional:      General: He is not in acute distress.    Appearance: He is well-developed.     Comments:  NAD  Eyes:     Conjunctiva/sclera: Conjunctivae normal.     Pupils: Pupils are equal, round, and reactive to light.  Neck:     Thyroid: No thyromegaly.     Vascular: No JVD.  Cardiovascular:     Rate and Rhythm: Normal rate and regular rhythm.     Heart sounds: Normal heart sounds. No murmur heard.  No friction rub. No gallop.   Pulmonary:     Effort: Pulmonary effort is normal. No respiratory distress.     Breath sounds: Normal breath sounds. No wheezing or rales.  Chest:     Chest wall: No tenderness.  Abdominal:     General: Bowel sounds are normal. There is no distension.     Palpations: Abdomen is  soft. There is no mass.     Tenderness: There is no abdominal tenderness. There is no guarding or rebound.  Musculoskeletal:        General: Tenderness present. Normal range of motion.     Cervical back: Normal range of motion.  Lymphadenopathy:     Cervical: No cervical adenopathy.  Skin:    General: Skin is warm and dry.     Findings: Bruising present. No rash.  Neurological:     Mental Status: He is alert and oriented to person, place, and time.     Cranial Nerves: No cranial nerve deficit.     Motor: No abnormal muscle tone.     Coordination: Coordination normal.     Gait: Gait normal.     Deep Tendon Reflexes: Reflexes are normal and symmetric.  Psychiatric:        Behavior: Behavior normal.        Thought Content: Thought content normal.        Judgment: Judgment normal.   LS stiff   I spent 22 minutes in addition to time for CPX wellness examination in preparing to see the patient by review of recent labs, imaging and procedures, obtaining and reviewing separately obtained history, communicating with the patient, ordering medications, tests or procedures, and documenting clinical information in the EHR including the differential diagnosis, treatment, and any further evaluation and other management of CRF, HTN.        Lab Results  Component Value Date   WBC 9.3 10/05/2020   HGB 14.0 10/05/2020   HCT 41.0 10/05/2020   PLT 247.0 10/05/2020   GLUCOSE 92 10/05/2020   CHOL 132 10/05/2020   TRIG 158.0 (H) 10/05/2020   HDL 53.70 10/05/2020   LDLDIRECT 148.0 06/07/2018   LDLCALC 46 10/05/2020   ALT 21 10/05/2020   AST 23 10/05/2020   NA 133 (L) 10/05/2020   K 4.1 10/05/2020   CL 99 10/05/2020   CREATININE 2.48 (H) 10/05/2020   BUN 38 (H) 10/05/2020   CO2 22 10/05/2020   TSH 2.62 10/05/2020   PSA 0.86 10/05/2020   INR 1.10 06/22/2015   HGBA1C 5.7 03/06/2017    VAS US CAROTID  Result Date: 08/07/2020 Carotid Arterial Duplex Study Indications:       Carotid artery  disease and Patient denies any cerebrovascular                    symptoms. Risk Factors:      Hypertension, hyperlipidemia, past history of smoking,                    coronary artery disease, PAD. Limitations        Today's exam was limited  due to Technically difficult study                    due to neck girth,vessel depth, very high bifurcation and                    patient respiration. Comparison Study:  In 07/2019, a carotid duplex showed a RICA velocity of 245/29                    cm/s and a LICA velocity of 767/20 cm/s. Performing Technologist: Wilkie Aye RVT  Examination Guidelines: A complete evaluation includes B-mode imaging, spectral Doppler, color Doppler, and power Doppler as needed of all accessible portions of each vessel. Bilateral testing is considered an integral part of a complete examination. Limited examinations for reoccurring indications may be performed as noted.  Right Carotid Findings: +----------+--------+--------+--------+----------------------+-----------------+           PSV cm/sEDV cm/sStenosisPlaque Description    Comments          +----------+--------+--------+--------+----------------------+-----------------+ CCA Prox  137     19              heterogenous                            +----------+--------+--------+--------+----------------------+-----------------+ CCA Mid   129     22              heterogenous                            +----------+--------+--------+--------+----------------------+-----------------+ CCA Distal115     20              heterogenous                            +----------+--------+--------+--------+----------------------+-----------------+ ICA Prox  180     29      40-59%  irregular and calcificper peak systolic                                                         velocity          +----------+--------+--------+--------+----------------------+-----------------+ ICA Mid   156     26                                                       +----------+--------+--------+--------+----------------------+-----------------+ ICA Distal97      27                                                      +----------+--------+--------+--------+----------------------+-----------------+ ECA       219     30      >50%    irregular and calcific                  +----------+--------+--------+--------+----------------------+-----------------+ +----------+--------+-------+--------+-------------------+  PSV cm/sEDV cmsDescribeArm Pressure (mmHG) +----------+--------+-------+--------+-------------------+ [id number]            Stenotic154                 +----------+--------+-------+--------+-------------------+ +---------+--------+--+--------+--+---------+ VertebralPSV cm/s57EDV cm/s17Antegrade +---------+--------+--+--------+--+---------+  Left Carotid Findings: +----------+--------+--------+--------+----------------------+-----------------+           PSV cm/sEDV cm/sStenosisPlaque Description    Comments          +----------+--------+--------+--------+----------------------+-----------------+ CCA Prox  81      18      <50%    hypoechoic                              +----------+--------+--------+--------+----------------------+-----------------+ CCA Mid   61      12      <50%    heterogenous                            +----------+--------+--------+--------+----------------------+-----------------+ CCA Distal81      20      <50%    heterogenous                            +----------+--------+--------+--------+----------------------+-----------------+ ICA Prox  153     31      40-59%  calcific and irregularper peak systolic                                                         velocity          +----------+--------+--------+--------+----------------------+-----------------+ ICA Mid   146     33                                                       +----------+--------+--------+--------+----------------------+-----------------+ ICA Distal103     16                                                      +----------+--------+--------+--------+----------------------+-----------------+ ECA       124     29              heterogenous                            +----------+--------+--------+--------+----------------------+-----------------+ +----------+--------+--------+----------------+-------------------+           PSV cm/sEDV cm/sDescribe        Arm Pressure (mmHG) +----------+--------+--------+----------------+-------------------+ Subclavian                Multiphasic, [id number]                 +----------+--------+--------+----------------+-------------------+ +---------+--------+--------+------+ VertebralPSV cm/sEDV cm/sAbsent +---------+--------+--------+------+   Summary: Right Carotid: Velocities in the right ICA are consistent with a 40-59%                stenosis. Non-hemodynamically significant plaque <50% noted in  the CCA. The ECA appears >50% stenosed. Left Carotid: Velocities in the left ICA are consistent with a 40-59% stenosis.               Non-hemodynamically significant plaque <50% noted in the CCA. The               ECA appears <50% stenosed. Vertebrals:  Right vertebral artery demonstrates antegrade flow. Left vertebral              artery demonstrates an occlusion. Subclavians: Right subclavian artery was stenotic. Normal flow hemodynamics were              seen in the left subclavian artery. *See table(s) above for measurements and observations. Suggest follow up study in 12 months. Electronically signed by Jenkins Rouge MD on 08/07/2020 at 3:26:05 PM.    Final     Assessment & Plan:    [name], MD

## 2020-10-13 NOTE — Assessment & Plan Note (Addendum)
  We discussed age appropriate health related issues, including available/recomended screening tests and vaccinations. Labs were ordered to be later reviewed . All questions were answered. We discussed one or more of the following - seat belt use, use of sunscreen/sun exposure exercise, safe sex, fall risk reduction, second hand smoke exposure, firearm use and storage, seat belt use, a need for adhering to healthy diet and exercise. Labs were ordered.  All questions were answered. Last colon 2019, due in 2024

## 2020-10-15 ENCOUNTER — Telehealth: Payer: Self-pay

## 2020-10-15 ENCOUNTER — Ambulatory Visit: Payer: PPO

## 2020-10-15 NOTE — Telephone Encounter (Signed)
Left several messages to contact office to do phone AWV at 9:00am.  No return call; appt cnx.

## 2020-10-28 ENCOUNTER — Other Ambulatory Visit: Payer: PPO

## 2020-11-16 ENCOUNTER — Other Ambulatory Visit: Payer: Self-pay | Admitting: Cardiovascular Disease

## 2020-11-16 ENCOUNTER — Other Ambulatory Visit: Payer: Self-pay | Admitting: Internal Medicine

## 2020-12-04 DIAGNOSIS — U071 COVID-19: Secondary | ICD-10-CM | POA: Diagnosis not present

## 2020-12-04 DIAGNOSIS — J069 Acute upper respiratory infection, unspecified: Secondary | ICD-10-CM | POA: Diagnosis not present

## 2020-12-07 ENCOUNTER — Other Ambulatory Visit: Payer: Self-pay | Admitting: Internal Medicine

## 2020-12-07 ENCOUNTER — Telehealth: Payer: Medicare HMO | Admitting: Family

## 2020-12-14 DIAGNOSIS — U071 COVID-19: Secondary | ICD-10-CM | POA: Insufficient documentation

## 2020-12-17 ENCOUNTER — Inpatient Hospital Stay: Payer: Medicare HMO | Admitting: Hematology & Oncology

## 2020-12-17 ENCOUNTER — Inpatient Hospital Stay: Payer: Medicare HMO

## 2020-12-17 ENCOUNTER — Telehealth: Payer: Self-pay

## 2020-12-17 NOTE — Telephone Encounter (Signed)
Returned pts call to r/s his appt, pt also states that he will view on my chart   Douglas Christensen

## 2020-12-25 ENCOUNTER — Other Ambulatory Visit: Payer: Medicare HMO

## 2020-12-25 ENCOUNTER — Ambulatory Visit: Payer: Medicare HMO | Admitting: Hematology & Oncology

## 2020-12-27 NOTE — Progress Notes (Unsigned)
Virtual Visit via Video Note   This visit type was conducted due to national recommendations for restrictions regarding the COVID-19 Pandemic (e.g. social distancing) in an effort to limit this patient's exposure and mitigate transmission in our community.  Due to his co-morbid illnesses, this patient is at least at moderate risk for complications without adequate follow up.  This format is felt to be most appropriate for this patient at this time.  All issues noted in this document were discussed and addressed.  A limited physical exam was performed with this format.  Please refer to the patient's chart for his consent to telehealth for Douglas Christensen.       Date:  12/28/2020   ID:  Douglas Christensen, DOB Nov 08, 1951, MRN [id number] The patient was identified using 2 identifiers.  Patient Location: Home Provider Location: Office/Clinic  PCP:  Cassandria Anger, MD  Cardiologist:  Sherren Mocha, MD   Electrophysiologist:  Thompson Grayer, MD   Evaluation Performed:  Follow-Up Visit  Chief Complaint:  Follow-up (CAD)    Patient Profile: Douglas Christensen is a 70 y.o. male with:  CAD s/p CABG in 04/2013 ? Myoview 7/20: EF 60, inf scar w/ peri-infarct ischemia; unchanged from 2019  PAD ? s/p R SFA stent and L CIA stent in 2008 ? L EIA stent 2012 ? L EIA/CFA/PFA/SFA endarterectomy ? R fem-pop 2014 ? L SFA stent in 3/16 ? 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 2-1 heart block in 06/2017 >> resolved with stoppingbeta-blocker ? S/p pacemaker in 2021   ??Abdominal aortic aneurysm (Lumbar MRI in 2020: 3.2 cm)  [place] 07/2019: no AAA  Hypertension   Hyperlipidemia  OSA  GERD,   prior ETOH abuse  Secondary polycythemia (smoking) - followed by Dr. Marin Olp   Prior CV Studies: Carotid US 08/07/20 Bilat ICA 40-59; L VA occluded; R subclavian  stenosis  Echocardiogram 04/01/20 EF 55-60, no RWMA, mod LVH, trivial MR, mild AV sclerosis w/o AS, asc Ao 38 mm  Lumbar MRI 06/28/2019 suprarenal abdominal aorta measuring 3.2 cm   Myoview 06/04/2019 Inf scar with mild to mod peri-infarct ischemia; EF 60; intermediate risk  Holter2/13/19 1. The basic rhythm is normal sinus with an average heart rate of 72 bpm 2. There are frequent PVCs (5% burden) 3. There is second-degree type I AV block and periods of 2: 1 heart block  Echo 01/22/18 Mild LVH, EF 40-98, grade 2 diastolic dysfunction, mild RAE  Nuclear stress test 01/10/18 Inferior/inferolateral/apical inferior defect with minimal reversibility and anteroseptal wall-likely diaphragmatic attenuation; cannot rule out infarct with peri-infarct ischemia; sinus rhythm with PVCs, PACs and intermittent type I second-degree AV block which improved with increased heart rate; intermediate risk  Echo 07/2014 EF 55-60%, Gr 2 DD Mild BAE  LHC (6/14): prox and mid LM 40-50%, prox LAD 70%, ostial Dx 75%, mid LAD 90-95%, prox CFX 60-70%, mid RCA occluded, EF 55-65% >>> CABG  Carotid US (1/14): Bilateral ICA 1-39%  Carotid US (1/14): Bilateral ICA 1-39%   History of Present Illness:   Douglas Christensen was last seen in 2/21 by Dr. Burt Knack.  His HR was elevated and he was placed on beta-blocker with metoprolol succinate.  He saw Dr. Rayann Heman in 4/21 and Oda Kilts, PA-C in 7/21.  A f/u echocardiogram in 5/21 demonstrated normal EF.  He is seen for f/u.  His SCr was elevated when he saw Dr. Alain Marion in 11/21.  He was referred to [facility] but needed to reschedule.  He never heard back from the [place] office.  He has not had chest pain, shortness of breath, syncope, orthopnea, leg edema.  He has lost about 20 lbs.  His BP has been up recently and he cites increased stress.  He is maintaining a low Na diet.  He has not had any meds yet this AM.    Past Medical History:  Diagnosis Date  . Anxiety    . Arthritis   . CAD (coronary artery disease)    Mild plaque (cath "years ago"); abnormal Myoview 04/2013 with subsequent CABG x 5 with LIMA to LAD, SVG to OM1, SVG to DX, SVG to PD & PL.   . Carotid artery disease ([place])    Carotid US 07/2019 bilateral ICA 40-59; bilateral subclavian stenosis // Carotid US 9/21: Bilateral ICA 40-59; L VA occluded; R subclavian stenosis  . Cataract   . GERD (gastroesophageal reflux disease)   . History of colonic polyps   . History of echocardiogram    Echo 2/19: Mild LVH, EF 60-65, normal wall motion, grade 2 diastolic dysfunction, mild RAE  . History of nuclear stress test    Myoview 2/19: inf/inf-lat/apical inf/ant-sept defect (?diaph atten - cannot rule out peri-infarct ischemia), PVCs/PACs/Mobitz 1 // Myoview 05/2019: EF 60, inf infarct with mild peri-infarct ischemia; no significant change when compared to prior study; Intermediate Risk  . Hx of echocardiogram    Echo (9/15):  EF 55-60%; Gr 2 DD, mild BAE  . Hyperlipidemia   . Hypertension   . LBP (low back pain)   . Meralgia paresthetica of left side 2011  . Polycythemia, secondary 02/03/2020  . PVD (peripheral vascular disease) ([place])    Stent to left common femoral and right superficial femoral.  2008.  50%  left renal   . Second degree AV block, Mobitz type I    Holter 2/19: Sinus rhythm, average heart rate 72, frequent PVCs (burden 5%), second-degree type I AV block and periods of 2:1 heart block >> continue clinical managment and avoid AVN blocking agents  . Shortness of breath    "once in awhile; can happen at anytime" (08/26/2013)  . Sleep apnea    mod OSA, central sleep apnea/hypoapnea syndrome 11/22/12, CPAP every night   . Subclavian artery stenosis ([place])    Carotid US 07/2019 bilateral ICA 40-59; bilateral subclavian stenosis  . Vitamin D deficiency    Past Surgical History:  Procedure Laterality Date  . ABDOMINAL AORTAGRAM N/A 11/16/2011   Procedure: ABDOMINAL Maxcine Ham;  Surgeon:  Sherren Mocha, MD;  Location: [facility] CATH LAB;  Service: Cardiovascular;  Laterality: N/A;  . ABDOMINAL AORTAGRAM N/A 11/14/2012   Procedure: ABDOMINAL Maxcine Ham;  Surgeon: Sherren Mocha, MD;  Location: [facility] CATH LAB;  Service: Cardiovascular;  Laterality: N/A;  . ABDOMINAL AORTAGRAM N/A 12/30/2014   Procedure: ABDOMINAL Maxcine Ham;  Surgeon: Serafina Mitchell, MD;  Location: [facility] CATH LAB;  Service: Cardiovascular;  Laterality: N/A;  . ABDOMINAL AORTOGRAM W/LOWER EXTREMITY Bilateral 08/06/2019   Procedure: ABDOMINAL AORTOGRAM W/LOWER EXTREMITY;  Surgeon: Serafina Mitchell, MD;  Location: [place] CV LAB;  Service: Cardiovascular;  Laterality: Bilateral;  . CARDIAC CATHETERIZATION  05/02/13   x2   . CORONARY ARTERY BYPASS GRAFT N/A 05/06/2013   Procedure: CORONARY ARTERY BYPASS GRAFTING (CABG);  Surgeon: Melrose Nakayama, MD;  Location: [place];  Service: Open  Heart Surgery;  Laterality: N/A;  Coronary artery bypass graft times five using left internal mammary artery and left greater saphenous vein via endovein harvest.  . ENDARTERECTOMY FEMORAL Left 01/29/2015   Procedure: ENDARTERECTOMY FEMORAL WITH PATCH ANGIOPLASTY;  Surgeon: Serafina Mitchell, MD;  Location: [place];  Service: Vascular;  Laterality: Left;  . EYE SURGERY  03/24/16   cataract surgery on left eye  . FEMORAL-POPLITEAL BYPASS GRAFT  12/27/2012   Procedure: BYPASS GRAFT FEMORAL-POPLITEAL ARTERY;  Surgeon: Serafina Mitchell, MD;  Location: [facility] OR;  Service: Vascular;  Laterality: Right;  using non-reversed sapphenous vein.  Marland Kitchen ILIAC ATHERECTOMY Left 01/29/2015   Procedure: SUPERFICIAL FEMORAL ARTERY ATHERECTOMY/PERCUTANEOUS TRANSLUMINAL ANGIOPLASTY; superficial femoral artery stent;  Surgeon: Serafina Mitchell, MD;  Location: [place];  Service: Vascular;  Laterality: Left;  . LOWER EXTREMITY ANGIOGRAM Bilateral 11/16/2011   Procedure: LOWER EXTREMITY ANGIOGRAM;  Surgeon: Sherren Mocha, MD;  Location: [facility] CATH LAB;  Service: Cardiovascular;  Laterality:  Bilateral;  . lower extremity stents     bilateral lower extremities x 2  . PACEMAKER IMPLANT N/A 11/28/2019   Procedure: PACEMAKER IMPLANT;  Surgeon: Thompson Grayer, MD;  Location: [place] CV LAB;  Service: Cardiovascular;  Laterality: N/A;  . PERCUTANEOUS STENT INTERVENTION Left 11/16/2011   Procedure: PERCUTANEOUS STENT INTERVENTION;  Surgeon: Sherren Mocha, MD;  Location: [facility] CATH LAB;  Service: Cardiovascular;  Laterality: Left;  . PERIPHERAL VASCULAR ATHERECTOMY Right 08/06/2019   Procedure: PERIPHERAL VASCULAR ATHERECTOMY;  Surgeon: Serafina Mitchell, MD;  Location: [place] CV LAB;  Service: Cardiovascular;  Laterality: Right;  Common iliac, external iliac, and popliteal.  . PERIPHERAL VASCULAR INTERVENTION Right 08/06/2019   Procedure: PERIPHERAL VASCULAR INTERVENTION;  Surgeon: Serafina Mitchell, MD;  Location: [place] CV LAB;  Service: Cardiovascular;  Laterality: Right;  common iliac, external iliac, and popliteal.  . TONSILLECTOMY    . TOTAL HIP ARTHROPLASTY Left 06/29/2015   Procedure: TOTAL HIP ARTHROPLASTY;  Surgeon: Frederik Pear, MD;  Location: [place];  Service: Orthopedics;  Laterality: Left;  LEFT TOTAL HIP ARTHROPLASTY DEPUY SROM/PINNACLE     Current Meds  Medication Sig  . acetaminophen (TYLENOL) 650 MG CR tablet Take 1,300 mg by mouth every 8 (eight) hours as needed for pain.  Marland Kitchen aspirin EC 81 MG tablet Take 81 mg by mouth daily. Swallow whole.  . cholecalciferol (VITAMIN D) 1000 units tablet Take 1,000 Units by mouth daily.   . clindamycin (CLINDAGEL) 1 % gel Apply 1 application topically 2 (two) times daily.   . clonazePAM (KLONOPIN) 0.5 MG tablet TAKE ONE TABLET TWICE DAILY AS NEEDED FOR ANXIETY  . hydrALAZINE (APRESOLINE) 50 MG tablet TAKE 1 tablet BY MOUTH THREE TIMES DAILY  . losartan (COZAAR) 25 MG tablet Take 2 tablets (Total 50 mg) in the morning (AM) and 1 tablet in the evening (PM). Please make yearly appt with Dr. Burt Knack for February 2022 before anymore  refills. Thank you 1st attempt  . metoprolol succinate (TOPROL-XL) 50 MG 24 hr tablet Take 1 tablet (50 mg total) by mouth daily. Take with or immediately following a meal.  . potassium chloride (KLOR-CON) 8 MEQ tablet Take 1 tablet (8 mEq total) by mouth daily.  . rosuvastatin (CRESTOR) 40 MG tablet Take 1 tablet (40 mg total) by mouth daily.  . tadalafil (CIALIS) 20 MG tablet Take 20 mg by mouth daily as needed for erectile dysfunction.  . traMADol (ULTRAM) 50 MG tablet Take 0.5 to 1 tablets (25-50 mg total) by mouth every 6 (  six) hours as needed for severe pain (pain).  . vitamin B-12 (CYANOCOBALAMIN) 1000 MCG tablet Take 1,000 mcg by mouth daily.  . [DISCONTINUED] metoprolol succinate (TOPROL-XL) 25 MG 24 hr tablet Take 1 tablet (25 mg total) by mouth daily.     Allergies:   Roxicodone [oxycodone hcl], Benazepril, Gabapentin, Itraconazole, and Lipitor [atorvastatin calcium]   Social History   Tobacco Use  . Smoking status: Former Smoker    Packs/day: 2.00    Years: 47.00    Pack years: 94.00    Types: Cigarettes    Quit date: 12/17/2012    Years since quitting: 8.0  . Smokeless tobacco: Never Used  Vaping Use  . Vaping Use: Never used  Substance Use Topics  . Alcohol use: Yes    Alcohol/week: 4.0 standard drinks    Types: 4 Shots of liquor per week    Comment: 08/26/2013 "3-4 mixed drinks/wk"  . Drug use: No     Family Hx: The patient's family history includes Alzheimer's disease in his father; Cancer in his father and mother; Heart disease in his mother; Hyperlipidemia in his mother; Hypertension in his mother. There is no history of Colon cancer, Heart attack, Esophageal cancer, Liver cancer, Rectal cancer, Stomach cancer, or Pancreatic cancer.  ROS:   Please see the history of present illness.    No melena, hematochezia, hematuria.     Labs/Other Tests and Data Reviewed:    EKG:  No ECG reviewed.  Recent Labs: 10/05/2020: TSH 2.62 12/28/2020: ALT 33; BUN 19;  Creatinine 1.03; Hemoglobin 15.1; Platelet Count 216; Potassium 3.8; Sodium 135   Recent Lipid Panel Lab Results  Component Value Date/Time   CHOL 132 10/05/2020 10:03 AM   CHOL 134 04/02/2019 09:56 AM   TRIG 158.0 (H) 10/05/2020 10:03 AM   TRIG 118 09/25/2006 10:02 AM   HDL 53.70 10/05/2020 10:03 AM   HDL 40 04/02/2019 09:56 AM   CHOLHDL 2 10/05/2020 10:03 AM   LDLCALC 46 10/05/2020 10:03 AM   LDLCALC 51 04/02/2019 09:56 AM   LDLDIRECT 148.0 06/07/2018 11:21 AM    Wt Readings from Last 3 Encounters:  12/28/20 182 lb (82.6 kg)  10/13/20 189 lb 9.6 oz (86 kg)  07/17/20 202 lb (91.6 kg)     Risk Assessment/Calculations:      Objective:    Vital Signs:  BP (!) 168/93   Pulse (!) 106   Ht 5\' 4"  (1.626 m)   Wt 182 lb (82.6 kg)   BMI 31.24 kg/m    VITAL SIGNS:  reviewed GEN:  no acute distress RESPIRATORY:  normal respiratory effort PSYCH:  normal affect  ASSESSMENT & PLAN:    1. Coronary artery disease involving native coronary artery of native heart without angina pectoris S/p CABG in 2014.  Myoview in 05/2019 with inf scar with peri-infarct ischemia.  Med Rx cont'd. He is doing well without chest pain.  Cont ASA, beta-blocker, statin.    2. Complete heart block ([place]) 3. Pacemaker FU with EP as planned.   4. Essential hypertension Uncontrolled.  He has not had meds yet today. Usually he notes his BP is 130-140s/70-80s and HR 70-80s.  Elevation today is likely due to not taking meds yet.  Increase Toprol XL to 50 mg once daily.  Track BP and send readings via MyChart in 2 weeks for review.  F/u in office in 6 mos.    5. Mixed hyperlipidemia LDL optimal on most recent lab work.  Continue current Rx.  6. Bilateral carotid artery stenosis Continue statin.  Repeat US in 9/22.  7. Stenosis of right subclavian artery (HCC) No symptoms.  Measure BP in L arm.  8. Chronic kidney disease  I will reach out to [facility] to see if they can reschedule with him.  If  he needs another referral I will let Dr. Alain Marion know.  He sees Dr. Marin Olp today and I will see if we can get a BMET added on to lab work there.     Time:   Today, I have spent 11 minutes with the patient with telehealth technology discussing the above problems.     Medication Adjustments/Labs and Tests Ordered: Current medicines are reviewed at length with the patient today.  Concerns regarding medicines are outlined above.   Tests Ordered: Orders Placed This Encounter  Procedures  . Basic metabolic panel    Medication Changes: Meds ordered this encounter  Medications  . metoprolol succinate (TOPROL-XL) 50 MG 24 hr tablet    Sig: Take 1 tablet (50 mg total) by mouth daily. Take with or immediately following a meal.    Dispense:  90 tablet    Refill:  3    Follow Up:  In Person in 6 month(s)  Signed, Richardson Dopp, PA-C  12/28/2020 4:47 PM    [place]

## 2020-12-28 ENCOUNTER — Encounter: Payer: Self-pay | Admitting: Family

## 2020-12-28 ENCOUNTER — Inpatient Hospital Stay: Payer: Medicare HMO | Attending: Hematology & Oncology

## 2020-12-28 ENCOUNTER — Telehealth (INDEPENDENT_AMBULATORY_CARE_PROVIDER_SITE_OTHER): Payer: Medicare HMO | Admitting: Physician Assistant

## 2020-12-28 ENCOUNTER — Inpatient Hospital Stay (HOSPITAL_BASED_OUTPATIENT_CLINIC_OR_DEPARTMENT_OTHER): Payer: Medicare HMO | Admitting: Family

## 2020-12-28 ENCOUNTER — Other Ambulatory Visit: Payer: Self-pay

## 2020-12-28 ENCOUNTER — Encounter: Payer: Self-pay | Admitting: Physician Assistant

## 2020-12-28 ENCOUNTER — Inpatient Hospital Stay: Payer: Medicare HMO

## 2020-12-28 VITALS — BP 168/93 | HR 106 | Ht 64.0 in | Wt 182.0 lb

## 2020-12-28 VITALS — BP 145/70 | HR 110 | Temp 98.2°F | Resp 18 | Ht 64.0 in | Wt 183.8 lb

## 2020-12-28 DIAGNOSIS — D500 Iron deficiency anemia secondary to blood loss (chronic): Secondary | ICD-10-CM | POA: Insufficient documentation

## 2020-12-28 DIAGNOSIS — F17210 Nicotine dependence, cigarettes, uncomplicated: Secondary | ICD-10-CM | POA: Insufficient documentation

## 2020-12-28 DIAGNOSIS — I442 Atrioventricular block, complete: Secondary | ICD-10-CM

## 2020-12-28 DIAGNOSIS — E782 Mixed hyperlipidemia: Secondary | ICD-10-CM

## 2020-12-28 DIAGNOSIS — R69 Illness, unspecified: Secondary | ICD-10-CM | POA: Diagnosis not present

## 2020-12-28 DIAGNOSIS — Z7902 Long term (current) use of antithrombotics/antiplatelets: Secondary | ICD-10-CM | POA: Diagnosis not present

## 2020-12-28 DIAGNOSIS — G4730 Sleep apnea, unspecified: Secondary | ICD-10-CM | POA: Insufficient documentation

## 2020-12-28 DIAGNOSIS — D751 Secondary polycythemia: Secondary | ICD-10-CM | POA: Diagnosis not present

## 2020-12-28 DIAGNOSIS — I10 Essential (primary) hypertension: Secondary | ICD-10-CM | POA: Diagnosis not present

## 2020-12-28 DIAGNOSIS — I771 Stricture of artery: Secondary | ICD-10-CM | POA: Diagnosis not present

## 2020-12-28 DIAGNOSIS — I2510 Atherosclerotic heart disease of native coronary artery without angina pectoris: Secondary | ICD-10-CM

## 2020-12-28 DIAGNOSIS — N1832 Chronic kidney disease, stage 3b: Secondary | ICD-10-CM

## 2020-12-28 DIAGNOSIS — I6523 Occlusion and stenosis of bilateral carotid arteries: Secondary | ICD-10-CM | POA: Diagnosis not present

## 2020-12-28 DIAGNOSIS — I779 Disorder of arteries and arterioles, unspecified: Secondary | ICD-10-CM

## 2020-12-28 DIAGNOSIS — Z950 Presence of cardiac pacemaker: Secondary | ICD-10-CM

## 2020-12-28 LAB — CBC WITH DIFFERENTIAL (CANCER CENTER ONLY)
Abs Immature Granulocytes: 0.02 10*3/uL (ref 0.00–0.07)
Basophils Absolute: 0.1 10*3/uL (ref 0.0–0.1)
Basophils Relative: 1 %
Eosinophils Absolute: 0.2 10*3/uL (ref 0.0–0.5)
Eosinophils Relative: 2 %
HCT: 42.6 % (ref 39.0–52.0)
Hemoglobin: 15.1 g/dL (ref 13.0–17.0)
Immature Granulocytes: 0 %
Lymphocytes Relative: 20 %
Lymphs Abs: 1.6 10*3/uL (ref 0.7–4.0)
MCH: 33.4 pg (ref 26.0–34.0)
MCHC: 35.4 g/dL (ref 30.0–36.0)
MCV: 94.2 fL (ref 80.0–100.0)
Monocytes Absolute: 0.8 10*3/uL (ref 0.1–1.0)
Monocytes Relative: 10 %
Neutro Abs: 5.3 10*3/uL (ref 1.7–7.7)
Neutrophils Relative %: 67 %
Platelet Count: 216 10*3/uL (ref 150–400)
RBC: 4.52 MIL/uL (ref 4.22–5.81)
RDW: 13.6 % (ref 11.5–15.5)
WBC Count: 7.9 10*3/uL (ref 4.0–10.5)
nRBC: 0 % (ref 0.0–0.2)

## 2020-12-28 LAB — CMP (CANCER CENTER ONLY)
ALT: 33 U/L (ref 0–44)
AST: 31 U/L (ref 15–41)
Albumin: 4.6 g/dL (ref 3.5–5.0)
Alkaline Phosphatase: 60 U/L (ref 38–126)
Anion gap: 12 (ref 5–15)
BUN: 19 mg/dL (ref 8–23)
CO2: 23 mmol/L (ref 22–32)
Calcium: 10.3 mg/dL (ref 8.9–10.3)
Chloride: 100 mmol/L (ref 98–111)
Creatinine: 1.03 mg/dL (ref 0.61–1.24)
GFR, Estimated: >60 mL/min (ref >60)
Glucose, Bld: 93 mg/dL (ref 70–99)
Potassium: 3.8 mmol/L (ref 3.5–5.1)
Sodium: 135 mmol/L (ref 135–145)
Total Bilirubin: 0.7 mg/dL (ref 0.3–1.2)
Total Protein: 7.4 g/dL (ref 6.5–8.1)

## 2020-12-28 LAB — LACTATE DEHYDROGENASE: LDH: 156 U/L (ref 98–192)

## 2020-12-28 MED ORDER — METOPROLOL SUCCINATE ER 50 MG PO TB24: 50.0000 mg | ORAL_TABLET | Freq: Every day | ORAL | 3 refills | Status: DC

## 2020-12-28 NOTE — Patient Instructions (Addendum)
Medication Instructions:  1. Increase the metoprolol succinate (Toprol XL) to 50 mg, take one tablet by mouth daily.  *If you need a refill on your cardiac medications before your next appointment, please call your pharmacy*   Lab Work: BMET today at the cancer center, Dr Antonieta Pert office  If you have labs (blood work) drawn today and your tests are completely normal, you will receive your results only by: Marland Kitchen MyChart Message (if you have MyChart) OR . A paper copy in the mail If you have any lab test that is abnormal or we need to change your treatment, we will call you to review the results.   Testing/Procedures: None   Follow-Up: At [facility], you and your health needs are our priority.  As part of our continuing mission to provide you with exceptional heart care, we have created designated Provider Care Teams.  These Care Teams include your primary Cardiologist (physician) and Advanced Practice Providers (APPs -  Physician Assistants and Nurse Practitioners) who all work together to provide you with the care you need, when you need it.   Your next appointment:   6 month(s)  The format for your next appointment:   In Person  Provider:   Sherren Mocha, MD or Richardson Dopp, PA-C   Other Instructions 1. Keep a record of your blood pressure over the next two weeks and sent [place] those readings through your MyChart.  2. [facility] will be in contact with you to reschedule your appointment

## 2020-12-28 NOTE — Progress Notes (Signed)
Hematology and Oncology Follow Up Visit  Douglas Christensen [id number] 08/26/1951 70 y.o. 12/28/2020   Principle Diagnosis:  Secondarypolycythemia,due to smoking/sleep apnea  Current Therapy:        Phlebotomy to keep Hct < 45% Brilinta 90 mg po BID   Interim History:  Douglas Christensen is here today for follow-up. He is doing well but has noted some fatigue at times.  Hct is stable at 42.6%.  He has dizziness when he stands too quickly and states that this started when he was put on Metoprolol. He has notified his cardiologist and is checking his BP daily at home and sending it to their office.  No fever, chills, n/v, cough, rash, SOB, chest pain, palpitations, abdominal pain or changes in bowel or bladder habits.  He is not smoking.  No swelling or tenderness in his extremities at this time.  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  No falls or syncope to report.  He has maintained a good appetite and is staying well hydrated. His weight is stable.  ECOG Performance Status: 1 - Symptomatic but completely ambulatory  Medications:  Allergies as of 12/28/2020      Reactions   Roxicodone [oxycodone Hcl] Other (See Comments)   hallucinations   Benazepril Cough   Gabapentin Rash   Itraconazole Nausea Only, Rash   Lipitor [atorvastatin Calcium]    cramps      Medication List       Accurate as of December 28, 2020  3:59 PM. If you have any questions, ask your nurse or doctor.        STOP taking these medications   triamterene-hydrochlorothiazide 37.5-25 MG tablet Commonly known as: MAXZIDE-25 Stopped by: Richardson Dopp, PA-C     TAKE these medications   acetaminophen 650 MG CR tablet Commonly known as: TYLENOL Take 1,300 mg by mouth every 8 (eight) hours as needed for pain.   aspirin EC 81 MG tablet Take 81 mg by mouth daily. Swallow whole.   cholecalciferol 1000 units tablet Commonly known as: VITAMIN D Take 1,000 Units by mouth daily.    clindamycin 1 % gel Commonly known as: CLINDAGEL Apply 1 application topically 2 (two) times daily.   clonazePAM 0.5 MG tablet Commonly known as: KLONOPIN TAKE ONE TABLET TWICE DAILY AS NEEDED FOR ANXIETY   hydrALAZINE 50 MG tablet Commonly known as: APRESOLINE TAKE 1 tablet BY MOUTH THREE TIMES DAILY   losartan 25 MG tablet Commonly known as: COZAAR Take 2 tablets (Total 50 mg) in the morning (AM) and 1 tablet in the evening (PM). Please make yearly appt with Dr. Burt Knack for February 2022 before anymore refills. Thank you 1st attempt   metoprolol succinate 50 MG 24 hr tablet Commonly known as: TOPROL-XL Take 1 tablet (50 mg total) by mouth daily. Take with or immediately following a meal. What changed:   medication strength  how much to take  additional instructions Changed by: Richardson Dopp, PA-C   potassium chloride 8 MEQ tablet Commonly known as: KLOR-CON Take 1 tablet (8 mEq total) by mouth daily.   rosuvastatin 40 MG tablet Commonly known as: CRESTOR Take 1 tablet (40 mg total) by mouth daily.   tadalafil 20 MG tablet Commonly known as: CIALIS Take 20 mg by mouth daily as needed for erectile dysfunction.   traMADol 50 MG tablet Commonly known as: ULTRAM Take 0.5 to 1 tablets (25-50 mg total) by mouth every 6 (six) hours as needed for severe pain (  pain).   vitamin B-12 1000 MCG tablet Commonly known as: CYANOCOBALAMIN Take 1,000 mcg by mouth daily.       Allergies:  Allergies  Allergen Reactions  . Roxicodone [Oxycodone Hcl] Other (See Comments)    hallucinations  . Benazepril Cough  . Gabapentin Rash  . Itraconazole Nausea Only and Rash  . Lipitor [Atorvastatin Calcium]     cramps    Past Medical History, Surgical history, Social history, and Family History were reviewed and updated.  Review of Systems: All other 10 point review of systems is negative.   Physical Exam:  vitals were not taken for this visit.   Wt Readings from Last 3  Encounters:  12/28/20 182 lb (82.6 kg)  10/13/20 189 lb 9.6 oz (86 kg)  07/17/20 202 lb (91.6 kg)    Ocular: Sclerae unicteric, pupils equal, round and reactive to light Ear-nose-throat: Oropharynx clear, dentition fair Lymphatic: No cervical or supraclavicular adenopathy Lungs no rales or rhonchi, good excursion bilaterally Heart regular rate and rhythm, no murmur appreciated Abd soft, nontender, positive bowel sounds MSK no focal spinal tenderness, no joint edema Neuro: non-focal, well-oriented, appropriate affect Breasts: Deferred   Lab Results  Component Value Date   WBC 7.9 12/28/2020   HGB 15.1 12/28/2020   HCT 42.6 12/28/2020   MCV 94.2 12/28/2020   PLT 216 12/28/2020   Lab Results  Component Value Date   FERRITIN 93 07/17/2020   IRON 73 07/17/2020   TIBC 344 07/17/2020   UIBC 271 07/17/2020   IRONPCTSAT 21 07/17/2020   Lab Results  Component Value Date   RETICCTPCT 2.1 (H) 07/13/2018   RBC 4.52 12/28/2020   No results found for: [facility] Lab Results  Component Value Date   IGGSERUM 858 05/01/2008   IGMSERUM 134 05/01/2008   No results found for: Odetta Pink, SPEI   Chemistry      Component Value Date/Time   NA 135 12/28/2020 1508   NA 136 05/29/2020 1233   K 3.8 12/28/2020 1508   CL 100 12/28/2020 1508   CO2 23 12/28/2020 1508   BUN 19 12/28/2020 1508   BUN 28 (H) 05/29/2020 1233   CREATININE 1.03 12/28/2020 1508      Component Value Date/Time   CALCIUM 10.3 12/28/2020 1508   ALKPHOS 60 12/28/2020 1508   AST 31 12/28/2020 1508   ALT 33 12/28/2020 1508   BILITOT 0.7 12/28/2020 1508       Impression and Plan: Douglas Christensen is a very pleasant 70 yo caucasian withsecondarypolycythemia. Hct is stable at 42.6%. No phlebotomy needed at this time.  He is taking his baby aspirin daily.  Follow-up in 6 months.  He was encouraged to contact our office with any  questions or concerns. We can certainly see him sooner if needed.   Laverna Peace, NP 1/31/20223:59 PM

## 2020-12-29 ENCOUNTER — Telehealth: Payer: Self-pay

## 2020-12-29 ENCOUNTER — Ambulatory Visit (INDEPENDENT_AMBULATORY_CARE_PROVIDER_SITE_OTHER): Payer: Medicare HMO

## 2020-12-29 DIAGNOSIS — I440 Atrioventricular block, first degree: Secondary | ICD-10-CM

## 2020-12-29 LAB — CUP PACEART REMOTE DEVICE CHECK
Battery Remaining Longevity: 128 mo
Battery Voltage: 3.04 V
Brady Statistic AP VP Percent: 30.23 %
Brady Statistic AP VS Percent: 0.02 %
Brady Statistic AS VP Percent: 68.85 %
Brady Statistic AS VS Percent: 0.9 %
Brady Statistic RA Percent Paced: 30.28 %
Brady Statistic RV Percent Paced: 99.08 %
Date Time Interrogation Session: 20220131211246
Implantable Lead Implant Date: 20201231
Implantable Lead Implant Date: 20201231
Implantable Lead Location: 753859
Implantable Lead Location: 753860
Implantable Lead Model: 3830
Implantable Lead Model: 5076
Implantable Pulse Generator Implant Date: 20201231
Lead Channel Impedance Value: 361 Ohm
Lead Channel Impedance Value: 399 Ohm
Lead Channel Impedance Value: 437 Ohm
Lead Channel Impedance Value: 570 Ohm
Lead Channel Pacing Threshold Amplitude: 0.5 V
Lead Channel Pacing Threshold Amplitude: 0.75 V
Lead Channel Pacing Threshold Pulse Width: 0.4 ms
Lead Channel Pacing Threshold Pulse Width: 0.4 ms
Lead Channel Sensing Intrinsic Amplitude: 5.875 mV
Lead Channel Sensing Intrinsic Amplitude: 5.875 mV
Lead Channel Sensing Intrinsic Amplitude: 7.625 mV
Lead Channel Sensing Intrinsic Amplitude: 7.625 mV
Lead Channel Setting Pacing Amplitude: 1.5 V
Lead Channel Setting Pacing Amplitude: 2.5 V
Lead Channel Setting Pacing Pulse Width: 0.4 ms
Lead Channel Setting Sensing Sensitivity: 1.2 mV

## 2020-12-29 LAB — IRON AND TIBC
Iron: 104 ug/dL (ref 42–163)
Saturation Ratios: 29 % (ref 20–55)
TIBC: 351 ug/dL (ref 202–409)
UIBC: 248 ug/dL (ref 117–376)

## 2020-12-29 LAB — FERRITIN: Ferritin: 134 ng/mL (ref 24–336)

## 2020-12-29 NOTE — Telephone Encounter (Signed)
appts made per 12/28/20 los and pt will view on my chart per his req     Douglas Christensen

## 2021-01-07 NOTE — Progress Notes (Signed)
Remote pacemaker transmission.   

## 2021-01-18 ENCOUNTER — Telehealth: Payer: Self-pay | Admitting: Internal Medicine

## 2021-01-18 NOTE — Telephone Encounter (Signed)
LVM for pt to rtn my call to schedule AWV with [facility]. Please schedule if pt calls the office.  ?

## 2021-01-22 ENCOUNTER — Other Ambulatory Visit: Payer: Self-pay | Admitting: Cardiovascular Disease

## 2021-02-07 DIAGNOSIS — R197 Diarrhea, unspecified: Secondary | ICD-10-CM | POA: Diagnosis not present

## 2021-02-07 DIAGNOSIS — R112 Nausea with vomiting, unspecified: Secondary | ICD-10-CM | POA: Diagnosis not present

## 2021-02-07 DIAGNOSIS — R0602 Shortness of breath: Secondary | ICD-10-CM | POA: Diagnosis not present

## 2021-02-07 DIAGNOSIS — I10 Essential (primary) hypertension: Secondary | ICD-10-CM | POA: Diagnosis not present

## 2021-02-07 DIAGNOSIS — E785 Hyperlipidemia, unspecified: Secondary | ICD-10-CM | POA: Diagnosis not present

## 2021-02-07 DIAGNOSIS — R1110 Vomiting, unspecified: Secondary | ICD-10-CM | POA: Diagnosis not present

## 2021-02-07 DIAGNOSIS — I2510 Atherosclerotic heart disease of native coronary artery without angina pectoris: Secondary | ICD-10-CM | POA: Diagnosis not present

## 2021-02-07 DIAGNOSIS — Z7982 Long term (current) use of aspirin: Secondary | ICD-10-CM | POA: Diagnosis not present

## 2021-02-11 ENCOUNTER — Other Ambulatory Visit: Payer: Self-pay | Admitting: Cardiovascular Disease

## 2021-03-09 ENCOUNTER — Telehealth: Payer: Self-pay

## 2021-03-09 NOTE — Telephone Encounter (Signed)
S/w pt and r/s his appt to 7/29 as sarah is not in the office on 7/28    Monteen Toops

## 2021-03-10 ENCOUNTER — Other Ambulatory Visit: Payer: Self-pay | Admitting: Cardiovascular Disease

## 2021-03-20 ENCOUNTER — Other Ambulatory Visit: Payer: Self-pay | Admitting: Internal Medicine

## 2021-03-22 ENCOUNTER — Other Ambulatory Visit: Payer: Self-pay

## 2021-03-22 MED ORDER — LOSARTAN POTASSIUM 50 MG PO TABS: ORAL_TABLET | ORAL | 1 refills | Status: DC

## 2021-03-22 NOTE — Telephone Encounter (Signed)
Patient is requesting a refill of the following medications: Requested Prescriptions   Pending Prescriptions Disp Refills  . traMADol (ULTRAM) 50 MG tablet [Pharmacy Med Name: tramadol 50 mg tablet] 100 tablet 1    Sig: Take 0.5 to 1 tablets (25-50 mg total) by mouth every 6 (six) hours as needed for severe pain    Date of patient request: 03/22/21  Last office visit: 10/13/20  Date of last refill: 12/14/20  Last refill amount: 100   Follow up time period per chart: n/a

## 2021-03-30 ENCOUNTER — Ambulatory Visit (INDEPENDENT_AMBULATORY_CARE_PROVIDER_SITE_OTHER): Payer: Medicare HMO

## 2021-03-30 DIAGNOSIS — I440 Atrioventricular block, first degree: Secondary | ICD-10-CM

## 2021-03-30 LAB — CUP PACEART REMOTE DEVICE CHECK
Battery Remaining Longevity: 123 mo
Battery Voltage: 3.03 V
Brady Statistic AP VP Percent: 35.69 %
Brady Statistic AP VS Percent: 0 %
Brady Statistic AS VP Percent: 53.82 %
Brady Statistic AS VS Percent: 10.48 %
Brady Statistic RA Percent Paced: 39.39 %
Brady Statistic RV Percent Paced: 89.51 %
Date Time Interrogation Session: 20220502205642
Implantable Lead Implant Date: 20201231
Implantable Lead Implant Date: 20201231
Implantable Lead Location: 753859
Implantable Lead Location: 753860
Implantable Lead Model: 3830
Implantable Lead Model: 5076
Implantable Pulse Generator Implant Date: 20201231
Lead Channel Impedance Value: 323 Ohm
Lead Channel Impedance Value: 399 Ohm
Lead Channel Impedance Value: 437 Ohm
Lead Channel Impedance Value: 513 Ohm
Lead Channel Pacing Threshold Amplitude: 0.75 V
Lead Channel Pacing Threshold Amplitude: 0.75 V
Lead Channel Pacing Threshold Pulse Width: 0.4 ms
Lead Channel Pacing Threshold Pulse Width: 0.4 ms
Lead Channel Sensing Intrinsic Amplitude: 17.75 mV
Lead Channel Sensing Intrinsic Amplitude: 17.75 mV
Lead Channel Sensing Intrinsic Amplitude: 5.25 mV
Lead Channel Sensing Intrinsic Amplitude: 5.25 mV
Lead Channel Setting Pacing Amplitude: 1.5 V
Lead Channel Setting Pacing Amplitude: 2.5 V
Lead Channel Setting Pacing Pulse Width: 0.4 ms
Lead Channel Setting Sensing Sensitivity: 1.2 mV

## 2021-04-21 NOTE — Progress Notes (Signed)
Remote pacemaker transmission.   

## 2021-04-27 ENCOUNTER — Other Ambulatory Visit: Payer: Self-pay | Admitting: Internal Medicine

## 2021-05-24 ENCOUNTER — Encounter: Payer: Self-pay | Admitting: Internal Medicine

## 2021-05-24 ENCOUNTER — Ambulatory Visit (INDEPENDENT_AMBULATORY_CARE_PROVIDER_SITE_OTHER): Payer: Medicare HMO | Admitting: Internal Medicine

## 2021-05-24 ENCOUNTER — Other Ambulatory Visit: Payer: Self-pay

## 2021-05-24 DIAGNOSIS — E538 Deficiency of other specified B group vitamins: Secondary | ICD-10-CM | POA: Diagnosis not present

## 2021-05-24 DIAGNOSIS — I2510 Atherosclerotic heart disease of native coronary artery without angina pectoris: Secondary | ICD-10-CM | POA: Diagnosis not present

## 2021-05-24 DIAGNOSIS — N184 Chronic kidney disease, stage 4 (severe): Secondary | ICD-10-CM | POA: Diagnosis not present

## 2021-05-24 DIAGNOSIS — M1810 Unilateral primary osteoarthritis of first carpometacarpal joint, unspecified hand: Secondary | ICD-10-CM | POA: Diagnosis not present

## 2021-05-24 DIAGNOSIS — F419 Anxiety disorder, unspecified: Secondary | ICD-10-CM | POA: Diagnosis not present

## 2021-05-24 DIAGNOSIS — R69 Illness, unspecified: Secondary | ICD-10-CM | POA: Diagnosis not present

## 2021-05-24 MED ORDER — TADALAFIL 20 MG PO TABS
20.0000 mg | ORAL_TABLET | ORAL | 3 refills | Status: DC | PRN
Start: 2021-05-24 — End: 2022-07-11

## 2021-05-24 MED ORDER — DICLOFENAC SODIUM 1 % EX GEL: 1.0000 "application " | Freq: Four times a day (QID) | CUTANEOUS | 3 refills | Status: DC

## 2021-05-24 NOTE — Assessment & Plan Note (Signed)
B hands Voltaren gel

## 2021-05-24 NOTE — Assessment & Plan Note (Signed)
On B12 

## 2021-05-24 NOTE — Progress Notes (Signed)
Subjective:  Patient ID: Douglas Christensen, male    DOB: November 19, 1951  Age: 70 y.o. MRN: [id number]  CC: Follow-up   HPI Douglas Christensen presents for CRI, ED - worse, HTN, CAD  Outpatient Medications Prior to Visit  Medication Sig Dispense Refill   acetaminophen (TYLENOL) 650 MG CR tablet Take 1,300 mg by mouth every 8 (eight) hours as needed for pain.     aspirin EC 81 MG tablet Take 81 mg by mouth daily. Swallow whole.     cholecalciferol (VITAMIN D) 1000 units tablet Take 1,000 Units by mouth daily.      clindamycin (CLINDAGEL) 1 % gel Apply 1 application topically 2 (two) times daily.      clonazePAM (KLONOPIN) 0.5 MG tablet TAKE ONE TABLET TWICE DAILY AS NEEDED FOR ANXIETY 60 tablet 3   hydrALAZINE (APRESOLINE) 50 MG tablet TAKE 1 tablet BY MOUTH THREE TIMES DAILY 270 tablet 3   losartan (COZAAR) 50 MG tablet Take 1 tablet (Total 50 mg) in the morning (AM) and 1/2 (25 mg) tablet in the evening (PM). 135 tablet 1   metoprolol succinate (TOPROL-XL) 50 MG 24 hr tablet Take 1 tablet (50 mg total) by mouth daily. Take with or immediately following a meal. 90 tablet 3   potassium chloride (KLOR-CON) 8 MEQ tablet Take 1 tablet (8 mEq total) by mouth daily. 90 tablet 3   rosuvastatin (CRESTOR) 40 MG tablet Take 1 tablet (40 mg total) by mouth daily. 90 tablet 2   tadalafil (CIALIS) 20 MG tablet Take 20 mg by mouth daily as needed for erectile dysfunction.     traMADol (ULTRAM) 50 MG tablet Take 0.5 to 1 tablets (25-50 mg total) by mouth every 6 (six) hours as needed for severe pain 100 tablet 1   vitamin B-12 (CYANOCOBALAMIN) 1000 MCG tablet Take 1,000 mcg by mouth daily.     No facility-administered medications prior to visit.    ROS: Review of Systems  Constitutional:  Negative for appetite change, fatigue and unexpected weight change.  HENT:  Negative for congestion, nosebleeds, sneezing, sore throat and trouble swallowing.   Eyes:  Negative for itching and visual disturbance.  Respiratory:   Negative for cough.   Cardiovascular:  Negative for chest pain, palpitations and leg swelling.  Gastrointestinal:  Negative for abdominal distention, blood in stool, diarrhea and nausea.  Genitourinary:  Negative for frequency and hematuria.  Musculoskeletal:  Positive for arthralgias and back pain. Negative for gait problem, joint swelling and neck pain.  Skin:  Negative for rash.  Neurological:  Negative for dizziness, tremors, speech difficulty and weakness.  Psychiatric/Behavioral:  Negative for agitation, dysphoric mood and sleep disturbance. The patient is nervous/anxious.    Objective:  BP (!) 151/72 (BP Location: Left Arm)   Pulse 60   Temp 98.1 F (36.7 C) (Oral)   Ht 5\' 4"  (1.626 m)   Wt 190 lb (86.2 kg)   SpO2 96%   BMI 32.61 kg/m   BP Readings from Last 3 Encounters:  05/24/21 (!) 151/72  12/28/20 (!) 145/70  12/28/20 (!) 168/93    Wt Readings from Last 3 Encounters:  05/24/21 190 lb (86.2 kg)  12/28/20 183 lb 12.8 oz (83.4 kg)  12/28/20 182 lb (82.6 kg)    Physical Exam Constitutional:      General: He is not in acute distress.    Appearance: He is well-developed. He is obese.     Comments: NAD  Eyes:     Conjunctiva/sclera: Conjunctivae  normal.     Pupils: Pupils are equal, round, and reactive to light.  Neck:     Thyroid: No thyromegaly.     Vascular: No JVD.  Cardiovascular:     Rate and Rhythm: Normal rate and regular rhythm.     Heart sounds: Normal heart sounds. No murmur heard.   No friction rub. No gallop.  Pulmonary:     Effort: Pulmonary effort is normal. No respiratory distress.     Breath sounds: Normal breath sounds. No wheezing or rales.  Chest:     Chest wall: No tenderness.  Abdominal:     General: Bowel sounds are normal. There is no distension.     Palpations: Abdomen is soft. There is no mass.     Tenderness: There is no abdominal tenderness. There is no guarding or rebound.  Musculoskeletal:        General: Tenderness present.  Normal range of motion.     Cervical back: Normal range of motion.  Lymphadenopathy:     Cervical: No cervical adenopathy.  Skin:    General: Skin is warm and dry.     Findings: No rash.  Neurological:     Mental Status: He is alert and oriented to person, place, and time.     Cranial Nerves: No cranial nerve deficit.     Motor: No abnormal muscle tone.     Coordination: Coordination normal.     Gait: Gait normal.     Deep Tendon Reflexes: Reflexes are normal and symmetric.  Psychiatric:        Behavior: Behavior normal.        Thought Content: Thought content normal.        Judgment: Judgment normal.   LS w/pain B thumbs w/pain  Lab Results  Component Value Date   WBC 7.9 12/28/2020   HGB 15.1 12/28/2020   HCT 42.6 12/28/2020   PLT 216 12/28/2020   GLUCOSE 93 12/28/2020   CHOL 132 10/05/2020   TRIG 158.0 (H) 10/05/2020   HDL 53.70 10/05/2020   LDLDIRECT 148.0 06/07/2018   LDLCALC 46 10/05/2020   ALT 33 12/28/2020   AST 31 12/28/2020   NA 135 12/28/2020   K 3.8 12/28/2020   CL 100 12/28/2020   CREATININE 1.03 12/28/2020   BUN 19 12/28/2020   CO2 23 12/28/2020   TSH 2.62 10/05/2020   PSA 0.86 10/05/2020   INR 1.10 06/22/2015   HGBA1C 5.7 03/06/2017    VAS US CAROTID  Result Date: 08/07/2020 Carotid Arterial Duplex Study Indications:       Carotid artery disease and Patient denies any cerebrovascular                    symptoms. Risk Factors:      Hypertension, hyperlipidemia, past history of smoking,                    coronary artery disease, PAD. Limitations        Today's exam was limited due to Technically difficult study                    due to neck girth,vessel depth, very high bifurcation and                    patient respiration. Comparison Study:  In 07/2019, a carotid duplex showed a RICA velocity of 245/29  cm/s and a LICA velocity of 161/09 cm/s. Performing Technologist: Wilkie Aye RVT  Examination Guidelines: A complete evaluation  includes B-mode imaging, spectral Doppler, color Doppler, and power Doppler as needed of all accessible portions of each vessel. Bilateral testing is considered an integral part of a complete examination. Limited examinations for reoccurring indications may be performed as noted.  Right Carotid Findings: +----------+--------+--------+--------+----------------------+-----------------+           PSV cm/sEDV cm/sStenosisPlaque Description    Comments          +----------+--------+--------+--------+----------------------+-----------------+ CCA Prox  137     19              heterogenous                            +----------+--------+--------+--------+----------------------+-----------------+ CCA Mid   129     22              heterogenous                            +----------+--------+--------+--------+----------------------+-----------------+ CCA Distal115     20              heterogenous                            +----------+--------+--------+--------+----------------------+-----------------+ ICA Prox  180     29      40-59%  irregular and calcificper peak systolic                                                         velocity          +----------+--------+--------+--------+----------------------+-----------------+ ICA Mid   156     26                                                      +----------+--------+--------+--------+----------------------+-----------------+ ICA Distal97      27                                                      +----------+--------+--------+--------+----------------------+-----------------+ ECA       219     30      >50%    irregular and calcific                  +----------+--------+--------+--------+----------------------+-----------------+ +----------+--------+-------+--------+-------------------+           PSV cm/sEDV cmsDescribeArm Pressure (mmHG) +----------+--------+-------+--------+-------------------+  [id number]            Stenotic154                 +----------+--------+-------+--------+-------------------+ +---------+--------+--+--------+--+---------+ VertebralPSV cm/s57EDV cm/s17Antegrade +---------+--------+--+--------+--+---------+  Left Carotid Findings: +----------+--------+--------+--------+----------------------+-----------------+           PSV cm/sEDV cm/sStenosisPlaque Description    Comments          +----------+--------+--------+--------+----------------------+-----------------+ CCA Prox  81      18      <  50%    hypoechoic                              +----------+--------+--------+--------+----------------------+-----------------+ CCA Mid   61      12      <50%    heterogenous                            +----------+--------+--------+--------+----------------------+-----------------+ CCA Distal81      20      <50%    heterogenous                            +----------+--------+--------+--------+----------------------+-----------------+ ICA Prox  153     31      40-59%  calcific and irregularper peak systolic                                                         velocity          +----------+--------+--------+--------+----------------------+-----------------+ ICA Mid   146     33                                                      +----------+--------+--------+--------+----------------------+-----------------+ ICA Distal103     16                                                      +----------+--------+--------+--------+----------------------+-----------------+ ECA       124     29              heterogenous                            +----------+--------+--------+--------+----------------------+-----------------+ +----------+--------+--------+----------------+-------------------+           PSV cm/sEDV cm/sDescribe        Arm Pressure (mmHG) +----------+--------+--------+----------------+-------------------+  Subclavian                Multiphasic, [id number]                 +----------+--------+--------+----------------+-------------------+ +---------+--------+--------+------+ VertebralPSV cm/sEDV cm/sAbsent +---------+--------+--------+------+   Summary: Right Carotid: Velocities in the right ICA are consistent with a 40-59%                stenosis. Non-hemodynamically significant plaque <50% noted in                the CCA. The ECA appears >50% stenosed. Left Carotid: Velocities in the left ICA are consistent with a 40-59% stenosis.               Non-hemodynamically significant plaque <50% noted in the CCA. The               ECA appears <50% stenosed. Vertebrals:  Right vertebral artery demonstrates antegrade flow. Left vertebral  artery demonstrates an occlusion. Subclavians: Right subclavian artery was stenotic. Normal flow hemodynamics were              seen in the left subclavian artery. *See table(s) above for measurements and observations. Suggest follow up study in 12 months. Electronically signed by Jenkins Rouge MD on 08/07/2020 at 3:26:05 PM.    Final     Assessment & Plan:     [name], MD

## 2021-05-24 NOTE — Assessment & Plan Note (Signed)
No CP 

## 2021-05-24 NOTE — Assessment & Plan Note (Signed)
Monitor GFR Nephrol ref - he needs to re-schedule

## 2021-05-24 NOTE — Assessment & Plan Note (Signed)
Clonazepam - rare use prn ° Potential benefits of a long term benzodiazepines  use as well as potential risks  and complications were explained to the patient and were aknowledged. °

## 2021-06-01 ENCOUNTER — Other Ambulatory Visit: Payer: Self-pay | Admitting: Cardiovascular Disease

## 2021-06-23 ENCOUNTER — Other Ambulatory Visit: Payer: Self-pay | Admitting: Cardiovascular Disease

## 2021-06-23 ENCOUNTER — Other Ambulatory Visit: Payer: Self-pay | Admitting: Internal Medicine

## 2021-06-24 ENCOUNTER — Ambulatory Visit: Payer: Medicare HMO | Admitting: Family

## 2021-06-24 ENCOUNTER — Other Ambulatory Visit: Payer: Medicare HMO

## 2021-06-25 ENCOUNTER — Inpatient Hospital Stay: Payer: Medicare HMO | Admitting: Family

## 2021-06-25 ENCOUNTER — Other Ambulatory Visit: Payer: Self-pay

## 2021-06-25 ENCOUNTER — Inpatient Hospital Stay: Payer: Medicare HMO

## 2021-06-25 ENCOUNTER — Encounter: Payer: Self-pay | Admitting: Family

## 2021-06-25 ENCOUNTER — Inpatient Hospital Stay: Payer: Medicare HMO | Attending: Hematology & Oncology

## 2021-06-25 VITALS — BP 150/64 | HR 71 | Temp 98.4°F | Resp 20 | Wt 193.8 lb

## 2021-06-25 DIAGNOSIS — R69 Illness, unspecified: Secondary | ICD-10-CM | POA: Diagnosis not present

## 2021-06-25 DIAGNOSIS — F17210 Nicotine dependence, cigarettes, uncomplicated: Secondary | ICD-10-CM | POA: Diagnosis not present

## 2021-06-25 DIAGNOSIS — G4730 Sleep apnea, unspecified: Secondary | ICD-10-CM | POA: Diagnosis not present

## 2021-06-25 DIAGNOSIS — R0602 Shortness of breath: Secondary | ICD-10-CM | POA: Diagnosis not present

## 2021-06-25 DIAGNOSIS — D751 Secondary polycythemia: Secondary | ICD-10-CM

## 2021-06-25 DIAGNOSIS — D500 Iron deficiency anemia secondary to blood loss (chronic): Secondary | ICD-10-CM | POA: Insufficient documentation

## 2021-06-25 DIAGNOSIS — Z7902 Long term (current) use of antithrombotics/antiplatelets: Secondary | ICD-10-CM | POA: Insufficient documentation

## 2021-06-25 LAB — CBC WITH DIFFERENTIAL (CANCER CENTER ONLY)
Abs Immature Granulocytes: 0.03 10*3/uL (ref 0.00–0.07)
Basophils Absolute: 0.1 10*3/uL (ref 0.0–0.1)
Basophils Relative: 1 %
Eosinophils Absolute: 0.4 10*3/uL (ref 0.0–0.5)
Eosinophils Relative: 5 %
HCT: 41.7 % (ref 39.0–52.0)
Hemoglobin: 14.8 g/dL (ref 13.0–17.0)
Immature Granulocytes: 0 %
Lymphocytes Relative: 21 %
Lymphs Abs: 1.9 10*3/uL (ref 0.7–4.0)
MCH: 34.1 pg — ABNORMAL HIGH (ref 26.0–34.0)
MCHC: 35.5 g/dL (ref 30.0–36.0)
MCV: 96.1 fL (ref 80.0–100.0)
Monocytes Absolute: 0.8 10*3/uL (ref 0.1–1.0)
Monocytes Relative: 9 %
Neutro Abs: 5.8 10*3/uL (ref 1.7–7.7)
Neutrophils Relative %: 64 %
Platelet Count: 213 10*3/uL (ref 150–400)
RBC: 4.34 MIL/uL (ref 4.22–5.81)
RDW: 12.6 % (ref 11.5–15.5)
WBC Count: 9 10*3/uL (ref 4.0–10.5)
nRBC: 0 % (ref 0.0–0.2)

## 2021-06-25 LAB — CMP (CANCER CENTER ONLY)
ALT: 25 U/L (ref 0–44)
AST: 24 U/L (ref 15–41)
Albumin: 4 g/dL (ref 3.5–5.0)
Alkaline Phosphatase: 55 U/L (ref 38–126)
Anion gap: 11 (ref 5–15)
BUN: 24 mg/dL — ABNORMAL HIGH (ref 8–23)
CO2: 21 mmol/L — ABNORMAL LOW (ref 22–32)
Calcium: 9.4 mg/dL (ref 8.9–10.3)
Chloride: 102 mmol/L (ref 98–111)
Creatinine: 1.41 mg/dL — ABNORMAL HIGH (ref 0.61–1.24)
GFR, Estimated: 54 mL/min — ABNORMAL LOW (ref >60)
Glucose, Bld: 93 mg/dL (ref 70–99)
Potassium: 4 mmol/L (ref 3.5–5.1)
Sodium: 134 mmol/L — ABNORMAL LOW (ref 135–145)
Total Bilirubin: 0.6 mg/dL (ref 0.3–1.2)
Total Protein: 7 g/dL (ref 6.5–8.1)

## 2021-06-25 LAB — LACTATE DEHYDROGENASE: LDH: 160 U/L (ref 98–192)

## 2021-06-25 NOTE — Progress Notes (Signed)
Hematology and Oncology Follow Up Visit  Douglas Christensen [id number] January 20, 1951 70 y.o. 06/25/2021   Principle Diagnosis:  Secondary polycythemia, due to smoking/sleep apnea   Current Therapy:        Phlebotomy to keep Hct < 45% Brilinta 90 mg po BID   Interim History:  Douglas Christensen is here today for follow-up. He is doing well and Hct is 41.7%. He has mild SOB at times due to COPD exacerbation.  He has had no issues with infection. No fever, chills, n/v, cough, rash, dizziness, chest pain, palpitations, abdominal pain or changes in bowel or bladder habits.  No episodes of bleeding. No bruising or petechiae.  No swelling and tenderness in his extremities.  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  No falls or syncope to report.  He has maintained a good appetite and is staying well hydrated. His weight is stable at 193 lbs.   ECOG Performance Status: 1 - Symptomatic but completely ambulatory  Medications:  Allergies as of 06/25/2021       Reactions   Roxicodone [oxycodone Hcl] Other (See Comments)   hallucinations   Benazepril Cough   Gabapentin Rash   Itraconazole Nausea Only, Rash   Lipitor [atorvastatin Calcium]    cramps        Medication List        Accurate as of June 25, 2021  3:16 PM. If you have any questions, ask your nurse or doctor.          STOP taking these medications    clindamycin 1 % gel Commonly known as: CLINDAGEL Stopped by: Laverna Peace, NP   diclofenac Sodium 1 % Gel Commonly known as: Voltaren Stopped by: Laverna Peace, NP       TAKE these medications    acetaminophen 650 MG CR tablet Commonly known as: TYLENOL Take 1,300 mg by mouth every 8 (eight) hours as needed for pain.   aspirin EC 81 MG tablet Take 81 mg by mouth daily. Swallow whole.   cholecalciferol 1000 units tablet Commonly known as: VITAMIN D Take 1,000 Units by mouth daily.   clonazePAM 0.5 MG tablet Commonly known as: KLONOPIN TAKE ONE  TABLET TWICE DAILY AS NEEDED FOR ANXIETY   hydrALAZINE 50 MG tablet Commonly known as: APRESOLINE TAKE 1 tablet BY MOUTH THREE TIMES DAILY   losartan 50 MG tablet Commonly known as: COZAAR Take 1 tablet (Total 50 mg) in the morning (AM) and 1/2 (25 mg) tablet in the evening (PM).   metoprolol succinate 50 MG 24 hr tablet Commonly known as: TOPROL-XL Take 1 tablet (50 mg total) by mouth daily. Take with or immediately following a meal.   potassium chloride 8 MEQ tablet Commonly known as: KLOR-CON Take 1 tablet (8 mEq total) by mouth daily.   rosuvastatin 40 MG tablet Commonly known as: CRESTOR Take 1 tablet (40 mg total) by mouth daily.   tadalafil 20 MG tablet Commonly known as: CIALIS Take 1 tablet (20 mg total) by mouth every three (3) days as needed for erectile dysfunction.   traMADol 50 MG tablet Commonly known as: ULTRAM Take 0.5 to 1 tablets (25-50 mg total) by mouth every 6 (six) hours as needed for severe pain   triamterene-hydrochlorothiazide 37.5-25 MG tablet Commonly known as: MAXZIDE-25 Take by mouth daily.   vitamin B-12 1000 MCG tablet Commonly known as: CYANOCOBALAMIN Take 1,000 mcg by mouth daily.        Allergies:  Allergies  Allergen Reactions   Roxicodone [Oxycodone  Hcl] Other (See Comments)    hallucinations   Benazepril Cough   Gabapentin Rash   Itraconazole Nausea Only and Rash   Lipitor [Atorvastatin Calcium]     cramps    Past Medical History, Surgical history, Social history, and Family History were reviewed and updated.  Review of Systems: All other 10 point review of systems is negative.   Physical Exam:  weight is 193 lb 12.8 oz (87.9 kg). His oral temperature is 98.4 F (36.9 C). His blood pressure is 150/64 (abnormal) and his pulse is 71. His respiration is 20 and oxygen saturation is 96%.   Wt Readings from Last 3 Encounters:  06/25/21 193 lb 12.8 oz (87.9 kg)  05/24/21 190 lb (86.2 kg)  12/28/20 183 lb 12.8 oz (83.4  kg)    Ocular: Sclerae unicteric, pupils equal, round and reactive to light Ear-nose-throat: Oropharynx clear, dentition fair Lymphatic: No cervical or supraclavicular adenopathy Lungs no rales or rhonchi, good excursion bilaterally Heart regular rate and rhythm, no murmur appreciated Abd soft, nontender, positive bowel sounds MSK no focal spinal tenderness, no joint edema Neuro: non-focal, well-oriented, appropriate affect Breasts: Deferred   Lab Results  Component Value Date   WBC 9.0 06/25/2021   HGB 14.8 06/25/2021   HCT 41.7 06/25/2021   MCV 96.1 06/25/2021   PLT 213 06/25/2021   Lab Results  Component Value Date   FERRITIN 134 12/28/2020   IRON 104 12/28/2020   TIBC 351 12/28/2020   UIBC 248 12/28/2020   IRONPCTSAT 29 12/28/2020   Lab Results  Component Value Date   RETICCTPCT 2.1 (H) 07/13/2018   RBC 4.34 06/25/2021   No results found for: Nils Pyle [facility] Lab Results  Component Value Date   IGGSERUM 858 05/01/2008   IGMSERUM 134 05/01/2008   No results found for: Kathrynn Ducking, MSPIKE, SPEI   Chemistry      Component Value Date/Time   NA 134 (L) 06/25/2021 1335   NA 136 05/29/2020 1233   K 4.0 06/25/2021 1335   CL 102 06/25/2021 1335   CO2 21 (L) 06/25/2021 1335   BUN 24 (H) 06/25/2021 1335   BUN 28 (H) 05/29/2020 1233   CREATININE 1.41 (H) 06/25/2021 1335      Component Value Date/Time   CALCIUM 9.4 06/25/2021 1335   ALKPHOS 55 06/25/2021 1335   AST 24 06/25/2021 1335   ALT 25 06/25/2021 1335   BILITOT 0.6 06/25/2021 1335       Impression and Plan: Douglas Christensen is a very pleasant 70 yo caucasian with secondary polycythemia.  No phlebtoomy needed this visit, Hct 41.7%.  He continues to take his aspirin daily.  Follow-up in 6 months.  He can contact our office with any questions or concners.   Laverna Peace, NP 7/29/20223:16 PM

## 2021-06-26 ENCOUNTER — Other Ambulatory Visit: Payer: Self-pay

## 2021-06-26 DIAGNOSIS — I739 Peripheral vascular disease, unspecified: Secondary | ICD-10-CM

## 2021-06-27 DIAGNOSIS — I10 Essential (primary) hypertension: Secondary | ICD-10-CM | POA: Diagnosis not present

## 2021-06-27 DIAGNOSIS — Z7982 Long term (current) use of aspirin: Secondary | ICD-10-CM | POA: Diagnosis not present

## 2021-06-27 DIAGNOSIS — G8929 Other chronic pain: Secondary | ICD-10-CM | POA: Diagnosis not present

## 2021-06-27 DIAGNOSIS — E785 Hyperlipidemia, unspecified: Secondary | ICD-10-CM | POA: Diagnosis not present

## 2021-06-27 DIAGNOSIS — Z8249 Family history of ischemic heart disease and other diseases of the circulatory system: Secondary | ICD-10-CM | POA: Diagnosis not present

## 2021-06-27 DIAGNOSIS — G4730 Sleep apnea, unspecified: Secondary | ICD-10-CM | POA: Diagnosis not present

## 2021-06-27 DIAGNOSIS — I2510 Atherosclerotic heart disease of native coronary artery without angina pectoris: Secondary | ICD-10-CM | POA: Diagnosis not present

## 2021-06-27 DIAGNOSIS — E669 Obesity, unspecified: Secondary | ICD-10-CM | POA: Diagnosis not present

## 2021-06-27 DIAGNOSIS — Z7722 Contact with and (suspected) exposure to environmental tobacco smoke (acute) (chronic): Secondary | ICD-10-CM | POA: Diagnosis not present

## 2021-06-27 DIAGNOSIS — Z6831 Body mass index (BMI) 31.0-31.9, adult: Secondary | ICD-10-CM | POA: Diagnosis not present

## 2021-06-28 ENCOUNTER — Telehealth: Payer: Self-pay | Admitting: *Deleted

## 2021-06-28 LAB — IRON AND TIBC
Iron: 103 ug/dL (ref 42–163)
Saturation Ratios: 31 % (ref 20–55)
TIBC: 338 ug/dL (ref 202–409)
UIBC: 234 ug/dL (ref 117–376)

## 2021-06-28 LAB — FERRITIN: Ferritin: 97 ng/mL (ref 24–336)

## 2021-06-28 NOTE — Telephone Encounter (Signed)
Per 06/25/21 los called and gave upcoming appointments - patient confirmed - mailed calendar

## 2021-06-29 ENCOUNTER — Ambulatory Visit (INDEPENDENT_AMBULATORY_CARE_PROVIDER_SITE_OTHER): Payer: Medicare HMO

## 2021-06-29 DIAGNOSIS — I442 Atrioventricular block, complete: Secondary | ICD-10-CM | POA: Diagnosis not present

## 2021-06-30 LAB — CUP PACEART REMOTE DEVICE CHECK
Battery Remaining Longevity: 124 mo
Battery Voltage: 3.02 V
Brady Statistic AP VP Percent: 35.6 %
Brady Statistic AP VS Percent: 0 %
Brady Statistic AS VP Percent: 54.52 %
Brady Statistic AS VS Percent: 9.87 %
Brady Statistic RA Percent Paced: 38.8 %
Brady Statistic RV Percent Paced: 90.12 %
Date Time Interrogation Session: 20220803105133
Implantable Lead Implant Date: 20201231
Implantable Lead Implant Date: 20201231
Implantable Lead Location: 753859
Implantable Lead Location: 753860
Implantable Lead Model: 3830
Implantable Lead Model: 5076
Implantable Pulse Generator Implant Date: 20201231
Lead Channel Impedance Value: 342 Ohm
Lead Channel Impedance Value: 437 Ohm
Lead Channel Impedance Value: 551 Ohm
Lead Channel Impedance Value: 570 Ohm
Lead Channel Pacing Threshold Amplitude: 0.625 V
Lead Channel Pacing Threshold Amplitude: 0.875 V
Lead Channel Pacing Threshold Pulse Width: 0.4 ms
Lead Channel Pacing Threshold Pulse Width: 0.4 ms
Lead Channel Sensing Intrinsic Amplitude: 31.625 mV
Lead Channel Sensing Intrinsic Amplitude: 31.625 mV
Lead Channel Sensing Intrinsic Amplitude: 5.875 mV
Lead Channel Sensing Intrinsic Amplitude: 5.875 mV
Lead Channel Setting Pacing Amplitude: 1.5 V
Lead Channel Setting Pacing Amplitude: 2.5 V
Lead Channel Setting Pacing Pulse Width: 0.4 ms
Lead Channel Setting Sensing Sensitivity: 1.2 mV

## 2021-07-06 ENCOUNTER — Ambulatory Visit (HOSPITAL_COMMUNITY)
Admission: RE | Admit: 2021-07-06 | Discharge: 2021-07-06 | Disposition: A | Discharge status: Home / Self Care | Payer: Medicare HMO | Source: Ambulatory Visit | Attending: Surgery | Admitting: Surgery

## 2021-07-06 ENCOUNTER — Encounter: Payer: Self-pay | Admitting: Physician Assistant

## 2021-07-06 ENCOUNTER — Other Ambulatory Visit: Payer: Self-pay

## 2021-07-06 ENCOUNTER — Ambulatory Visit (INDEPENDENT_AMBULATORY_CARE_PROVIDER_SITE_OTHER)
Admission: RE | Admit: 2021-07-06 | Discharge: 2021-07-06 | Disposition: A | Discharge status: Home / Self Care | Payer: Medicare HMO | Source: Ambulatory Visit | Attending: Surgery | Admitting: Surgery

## 2021-07-06 ENCOUNTER — Ambulatory Visit: Payer: Medicare HMO | Admitting: Physician Assistant

## 2021-07-06 VITALS — BP 133/63 | HR 64 | Temp 97.8°F | Ht 64.0 in | Wt 188.2 lb

## 2021-07-06 DIAGNOSIS — I739 Peripheral vascular disease, unspecified: Secondary | ICD-10-CM

## 2021-07-06 NOTE — Progress Notes (Signed)
Office Note     CC:  follow up Requesting Provider:  Plotnikov, Evie Lacks, MD  HPI: Douglas Christensen is a 70 y.o. (1951/09/27) male who presents for follow up of peripheral artery disease. He most recently underwent atherectomy and stent placement of the right common iliac artery, external iliac artery and popliteal artery on 08/06/19 by Dr. Trula Slade for disabling claudication. He says since his procedure he no longer has been having any pain in his calves. However over past 6-12 months he has been having bilateral hip pain on ambulation. This improves with rest. He says some days he can walk further than others before it occurs. He describes it as " just a pain". Says it does feel similar to the pain he had in his calves. No radiation of the pain. He otherwise does not have pain at rest. His previous abrasions on left foot healed well. No new tissue loss.   He has history of left femoral endarterectomy with vein patch angioplasty, atherectomy and stenting of left iliac and superficial femoral arteries on 01/29/15 by Dr. Trula Slade. Also has had prior right common femoral endarterectomy and femoral to above knee popliteal artery saphenous vein bypass graft on 12/10/12 by Dr. Trula Slade. Both of these were performed due to lifestyle limiting claudication  The pt is on a statin for cholesterol management.  The pt is on a daily aspirin.   Other AC:  none The pt is on ARB, BB, Hydralazine for hypertension.   The pt is diabetic.  Tobacco hx:  Former, quit 2008  Past Medical History:  Diagnosis Date   Anxiety    Arthritis    CAD (coronary artery disease)    Mild plaque (cath "years ago"); abnormal Myoview 04/2013 with subsequent CABG x 5 with LIMA to LAD, SVG to OM1, SVG to DX, SVG to PD & PL.    Carotid artery disease ([place])    Carotid US 07/2019 bilateral ICA 40-59; bilateral subclavian stenosis // Carotid US 9/21: Bilateral ICA 40-59; L VA occluded; R subclavian stenosis   Cataract    GERD (gastroesophageal  reflux disease)    History of colonic polyps    History of echocardiogram    Echo 2/19: Mild LVH, EF 60-65, normal wall motion, grade 2 diastolic dysfunction, mild RAE   History of nuclear stress test    Myoview 2/19: inf/inf-lat/apical inf/ant-sept defect (?diaph atten - cannot rule out peri-infarct ischemia), PVCs/PACs/Mobitz 1 // Myoview 05/2019: EF 60, inf infarct with mild peri-infarct ischemia; no significant change when compared to prior study; Intermediate Risk   Hx of echocardiogram    Echo (9/15):  EF 55-60%; Gr 2 DD, mild BAE   Hyperlipidemia    Hypertension    LBP (low back pain)    Meralgia paresthetica of left side 2011   Polycythemia, secondary 02/03/2020   PVD (peripheral vascular disease) ([place])    Stent to left common femoral and right superficial femoral.  2008.  50%  left renal    Second degree AV block, Mobitz type I    Holter 2/19: Sinus rhythm, average heart rate 72, frequent PVCs (burden 5%), second-degree type I AV block and periods of 2:1 heart block >> continue clinical managment and avoid AVN blocking agents   Shortness of breath    "once in awhile; can happen at anytime" (08/26/2013)   Sleep apnea    mod OSA, central sleep apnea/hypoapnea syndrome 11/22/12, CPAP every night    Subclavian artery stenosis ([place])    Carotid US  07/2019 bilateral ICA 40-59; bilateral subclavian stenosis   Vitamin D deficiency     Past Surgical History:  Procedure Laterality Date   ABDOMINAL AORTAGRAM N/A 11/16/2011   Procedure: ABDOMINAL AORTAGRAM;  Surgeon: Sherren Mocha, MD;  Location: [facility] CATH LAB;  Service: Cardiovascular;  Laterality: N/A;   ABDOMINAL AORTAGRAM N/A 11/14/2012   Procedure: ABDOMINAL Maxcine Ham;  Surgeon: Sherren Mocha, MD;  Location: [facility] CATH LAB;  Service: Cardiovascular;  Laterality: N/A;   ABDOMINAL AORTAGRAM N/A 12/30/2014   Procedure: ABDOMINAL Maxcine Ham;  Surgeon: Serafina Mitchell, MD;  Location: [facility] CATH LAB;  Service: Cardiovascular;  Laterality: N/A;    ABDOMINAL AORTOGRAM W/LOWER EXTREMITY Bilateral 08/06/2019   Procedure: ABDOMINAL AORTOGRAM W/LOWER EXTREMITY;  Surgeon: Serafina Mitchell, MD;  Location: [place] CV LAB;  Service: Cardiovascular;  Laterality: Bilateral;   CARDIAC CATHETERIZATION  05/02/13   x2    CORONARY ARTERY BYPASS GRAFT N/A 05/06/2013   Procedure: CORONARY ARTERY BYPASS GRAFTING (CABG);  Surgeon: Melrose Nakayama, MD;  Location: [place];  Service: Open Heart Surgery;  Laterality: N/A;  Coronary artery bypass graft times five using left internal mammary artery and left greater saphenous vein via endovein harvest.   ENDARTERECTOMY FEMORAL Left 01/29/2015   Procedure: ENDARTERECTOMY FEMORAL WITH PATCH ANGIOPLASTY;  Surgeon: Serafina Mitchell, MD;  Location: [facility] OR;  Service: Vascular;  Laterality: Left;   EYE SURGERY  03/24/16   cataract surgery on left eye   FEMORAL-POPLITEAL BYPASS GRAFT  12/27/2012   Procedure: BYPASS GRAFT FEMORAL-POPLITEAL ARTERY;  Surgeon: Serafina Mitchell, MD;  Location: [facility] OR;  Service: Vascular;  Laterality: Right;  using non-reversed sapphenous vein.   ILIAC ATHERECTOMY Left 01/29/2015   Procedure: SUPERFICIAL FEMORAL ARTERY ATHERECTOMY/PERCUTANEOUS TRANSLUMINAL ANGIOPLASTY; superficial femoral artery stent;  Surgeon: Serafina Mitchell, MD;  Location: [place];  Service: Vascular;  Laterality: Left;   LOWER EXTREMITY ANGIOGRAM Bilateral 11/16/2011   Procedure: LOWER EXTREMITY ANGIOGRAM;  Surgeon: Sherren Mocha, MD;  Location: [facility] CATH LAB;  Service: Cardiovascular;  Laterality: Bilateral;   lower extremity stents     bilateral lower extremities x 2   PACEMAKER IMPLANT N/A 11/28/2019   Procedure: PACEMAKER IMPLANT;  Surgeon: Thompson Grayer, MD;  Location: [place] CV LAB;  Service: Cardiovascular;  Laterality: N/A;   PERCUTANEOUS STENT INTERVENTION Left 11/16/2011   Procedure: PERCUTANEOUS STENT INTERVENTION;  Surgeon: Sherren Mocha, MD;  Location: [facility] CATH LAB;  Service: Cardiovascular;  Laterality: Left;    PERIPHERAL VASCULAR ATHERECTOMY Right 08/06/2019   Procedure: PERIPHERAL VASCULAR ATHERECTOMY;  Surgeon: Serafina Mitchell, MD;  Location: [place] CV LAB;  Service: Cardiovascular;  Laterality: Right;  Common iliac, external iliac, and popliteal.   PERIPHERAL VASCULAR INTERVENTION Right 08/06/2019   Procedure: PERIPHERAL VASCULAR INTERVENTION;  Surgeon: Serafina Mitchell, MD;  Location: [place] CV LAB;  Service: Cardiovascular;  Laterality: Right;  common iliac, external iliac, and popliteal.   TONSILLECTOMY     TOTAL HIP ARTHROPLASTY Left 06/29/2015   Procedure: TOTAL HIP ARTHROPLASTY;  Surgeon: Frederik Pear, MD;  Location: [place];  Service: Orthopedics;  Laterality: Left;  LEFT TOTAL HIP ARTHROPLASTY DEPUY SROM/PINNACLE    Social History   Socioeconomic History   Marital status: Widowed    Spouse name: Not on file   Number of children: 2   Years of education: Not on file   Highest education level: Not on file  Occupational History   Occupation: Detective    Employer: [employer]  Tobacco Use   Smoking status: Former  Packs/day: 2.00    Years: 47.00    Pack years: 94.00    Types: Cigarettes    Quit date: 12/17/2012    Years since quitting: 8.5   Smokeless tobacco: Never  Vaping Use   Vaping Use: Never used  Substance and Sexual Activity   Alcohol use: Yes    Alcohol/week: 4.0 standard drinks    Types: 4 Shots of liquor per week    Comment: 08/26/2013 "3-4 mixed drinks/wk"   Drug use: No   Sexual activity: Yes  Other Topics Concern   Not on file  Social History Narrative   Widower   Social Determinants of Health   Financial Resource Strain: Not on file  Food Insecurity: Not on file  Transportation Needs: Not on file  Physical Activity: Not on file  Stress: Not on file  Social Connections: Not on file  Intimate Partner Violence: Not on file    Family History  Problem Relation Age of Onset   Heart disease Mother        No clear CAD and Heart Disease before  age 40   Hypertension Mother    Cancer Mother        ? colon ca   Hyperlipidemia Mother    Cancer Father        brain tumor   Alzheimer's disease Father    Colon cancer Neg Hx    Heart attack Neg Hx    Esophageal cancer Neg Hx    Liver cancer Neg Hx    Rectal cancer Neg Hx    Stomach cancer Neg Hx    Pancreatic cancer Neg Hx     Current Outpatient Medications  Medication Sig Dispense Refill   acetaminophen (TYLENOL) 650 MG CR tablet Take 1,300 mg by mouth every 8 (eight) hours as needed for pain.     aspirin EC 81 MG tablet Take 81 mg by mouth daily. Swallow whole.     cholecalciferol (VITAMIN D) 1000 units tablet Take 1,000 Units by mouth daily.      clonazePAM (KLONOPIN) 0.5 MG tablet TAKE ONE TABLET TWICE DAILY AS NEEDED FOR ANXIETY 60 tablet 3   hydrALAZINE (APRESOLINE) 50 MG tablet TAKE 1 tablet BY MOUTH THREE TIMES DAILY 270 tablet 3   losartan (COZAAR) 50 MG tablet Take 1 tablet (Total 50 mg) in the morning (AM) and 1/2 (25 mg) tablet in the evening (PM). 135 tablet 1   metoprolol succinate (TOPROL-XL) 50 MG 24 hr tablet Take 1 tablet (50 mg total) by mouth daily. Take with or immediately following a meal. 90 tablet 3   potassium chloride (KLOR-CON) 8 MEQ tablet Take 1 tablet (8 mEq total) by mouth daily. 90 tablet 3   rosuvastatin (CRESTOR) 40 MG tablet Take 1 tablet (40 mg total) by mouth daily. 90 tablet 1   tadalafil (CIALIS) 20 MG tablet Take 1 tablet (20 mg total) by mouth every three (3) days as needed for erectile dysfunction. 30 tablet 3   traMADol (ULTRAM) 50 MG tablet Take 0.5 to 1 tablets (25-50 mg total) by mouth every 6 (six) hours as needed for severe pain 100 tablet 2   triamterene-hydrochlorothiazide (MAXZIDE-25) 37.5-25 MG tablet Take by mouth daily.     vitamin B-12 (CYANOCOBALAMIN) 1000 MCG tablet Take 1,000 mcg by mouth daily.     No current facility-administered medications for this visit.    Allergies  Allergen Reactions   Roxicodone [Oxycodone Hcl]  Other (See Comments)    hallucinations   Benazepril  Cough   Gabapentin Rash   Itraconazole Nausea Only and Rash   Lipitor [Atorvastatin Calcium]     cramps     REVIEW OF SYSTEMS:  [X]  denotes positive finding, [ ]  denotes negative finding Cardiac  Comments:  Chest pain or chest pressure:    Shortness of breath upon exertion:    Short of breath when lying flat:    Irregular heart rhythm:        Vascular    Pain in calf, thigh, or hip brought on by ambulation:    Pain in feet at night that wakes you up from your sleep:     Blood clot in your veins:    Leg swelling:         Pulmonary    Oxygen at home:    Productive cough:     Wheezing:         Neurologic    Sudden weakness in arms or legs:     Sudden numbness in arms or legs:     Sudden onset of difficulty speaking or slurred speech:    Temporary loss of vision in one eye:     Problems with dizziness:         Gastrointestinal    Blood in stool:     Vomited blood:         Genitourinary    Burning when urinating:     Blood in urine:        Psychiatric    Major depression:         Hematologic    Bleeding problems:    Problems with blood clotting too easily:        Skin    Rashes or ulcers:        Constitutional    Fever or chills:      PHYSICAL EXAMINATION:  Vitals:   07/06/21 0920  BP: 133/63  Pulse: 64  Temp: 97.8 F (36.6 C)  TempSrc: Temporal  SpO2: 94%  Weight: 188 lb 3.2 oz (85.4 kg)  Height: 5\' 4"  (1.626 m)    General:  WDWN in NAD; vital signs documented above Gait: Normal HENT: WNL, normocephalic Pulmonary: normal non-labored breathing , without  wheezing Cardiac: regular HR, without  Murmurs without carotid bruit Abdomen: distended, soft, NT, no masses Vascular Exam/Pulses:  Right Left  Radial 2+ (normal) 2+ (normal)  Femoral 2+ (normal) 2+ (normal)  Popliteal 1+ (weak) Not palpable  DP 2+ (normal) 2+ (normal)  PT 1+ (weak) Not palpable   Extremities: without ischemic changes,  without Gangrene , without cellulitis; without open wounds;  Musculoskeletal: no muscle wasting or atrophy  Neurologic: A&O X 3;  No focal weakness or paresthesias are detected Psychiatric:  The pt has Normal affect.   Non-Invasive Vascular Imaging:  07/06/21  +-------+-----------+-----------+------------+------------+  ABI/TBIToday's ABIToday's TBIPrevious ABIPrevious TBI  +-------+-----------+-----------+------------+------------+  Right  1.09       .95        1.12        .96           +-------+-----------+-----------+------------+------------+  Left   1.17       .79        1.02        .91           +-------+-----------+-----------+------------+------------+      Abdominal Aorta Findings:  +-------------+-------+----------+----------+----------+--------+--------+  Location     AP (cm)Trans (cm)PSV (cm/s)Waveform  ThrombusComments  +-------------+-------+----------+----------+----------+--------+--------+  Proximal  79        biphasic                    +-------------+-------+----------+----------+----------+--------+--------+  Mid                           96        biphasic                    +-------------+-------+----------+----------+----------+--------+--------+  Distal                        75        biphasic                    +-------------+-------+----------+----------+----------+--------+--------+  RT CIA Prox                   67        biphasic                    +-------------+-------+----------+----------+----------+--------+--------+  RT EIA Prox                   79        biphasic                    +-------------+-------+----------+----------+----------+--------+--------+  RT EIA Mid                    206       triphasic                   +-------------+-------+----------+----------+----------+--------+--------+  RT EIA Distal                 358       biphasic                     +-------------+-------+----------+----------+----------+--------+--------+  LT CIA Prox                   81        monophasic                  +-------------+-------+----------+----------+----------+--------+--------+  LT CIA Mid                    75        biphasic                    +-------------+-------+----------+----------+----------+--------+--------+  LT CIA Distal                 88        biphasic                    +-------------+-------+----------+----------+----------+--------+--------+  LT EIA Prox                   93        biphasic                    +-------------+-------+----------+----------+----------+--------+--------+  LT EIA Mid                    416       biphasic                    +-------------+-------+----------+----------+----------+--------+--------+  LT EIA Distal  348       biphasic                    +-------------+-------+----------+----------+----------+--------+--------+   Summary:  Right; Increased velocity in the distal EIA area suggestive of 50 - 99% stenosis range.  Left: Increased velocity in the mid to distal EIA suggestive of 50 - 99% stenosis range.    ASSESSMENT/PLAN:: 70 y.o. male here for follow up for peripheral artery disease. He has extensive surgical and endovascular history for lower extremity claudication. Today he is doing well without any new calf claudication, rest pain or tissue loss. He has what sounds like buttock or hip claudication.  -ABI's are essentially unchanged from prior study. Duplex showing bilateral external iliac stenosis 50-99%. There have been elevated velocities on the left on prior studies, the right is new on today's exam. -I have discussed with patient close interval follow up vs. Arteriogram. He would like to hold off at this time - I have encouraged him to do a walking regimen - Continue Aspirin, statin - He will follow up sooner if  he has new or worsening symptoms - Follow up in 3 months with repeat ABI   Karoline Caldwell, PA-C [facility] MD:   Roxanne Mins

## 2021-07-07 ENCOUNTER — Encounter: Payer: Self-pay | Admitting: Internal Medicine

## 2021-07-07 ENCOUNTER — Ambulatory Visit (INDEPENDENT_AMBULATORY_CARE_PROVIDER_SITE_OTHER): Payer: Medicare HMO | Admitting: Internal Medicine

## 2021-07-07 ENCOUNTER — Other Ambulatory Visit: Payer: Self-pay

## 2021-07-07 DIAGNOSIS — F419 Anxiety disorder, unspecified: Secondary | ICD-10-CM

## 2021-07-07 DIAGNOSIS — I129 Hypertensive chronic kidney disease with stage 1 through stage 4 chronic kidney disease, or unspecified chronic kidney disease: Secondary | ICD-10-CM

## 2021-07-07 DIAGNOSIS — I739 Peripheral vascular disease, unspecified: Secondary | ICD-10-CM

## 2021-07-07 DIAGNOSIS — L932 Other local lupus erythematosus: Secondary | ICD-10-CM

## 2021-07-07 DIAGNOSIS — T465X5A Adverse effect of other antihypertensive drugs, initial encounter: Secondary | ICD-10-CM | POA: Insufficient documentation

## 2021-07-07 DIAGNOSIS — R69 Illness, unspecified: Secondary | ICD-10-CM | POA: Diagnosis not present

## 2021-07-07 MED ORDER — METHYLPREDNISOLONE 4 MG PO TBPK: ORAL_TABLET | ORAL | 0 refills | Status: DC

## 2021-07-07 MED ORDER — DOXYCYCLINE HYCLATE 100 MG PO TABS: 100.0000 mg | ORAL_TABLET | Freq: Two times a day (BID) | ORAL | 1 refills | Status: DC

## 2021-07-07 MED ORDER — CLINDAMYCIN PHOSPHATE 1 % EX LOTN: TOPICAL_LOTION | Freq: Two times a day (BID) | CUTANEOUS | 2 refills | Status: DC

## 2021-07-07 NOTE — Assessment & Plan Note (Signed)
D/c Hydralazine dure to lupus RTC 2 wks

## 2021-07-07 NOTE — Progress Notes (Signed)
Subjective:  Patient ID: Douglas Christensen, male    DOB: 02-09-1951  Age: 70 y.o. MRN: [id number]  CC: Rash (On his face & Neck)   HPI JACAI KIPP presents for rash on face and chest and face R>L x 2 wks - getting worse. Seeing pus when shaving He was at [employer], worked at [employer].  He tried to use clindamycin cream with no relief. General has been taking hydralazine for his hypertension for a while.    Outpatient Medications Prior to Visit  Medication Sig Dispense Refill   acetaminophen (TYLENOL) 650 MG CR tablet Take 1,300 mg by mouth every 8 (eight) hours as needed for pain.     aspirin EC 81 MG tablet Take 81 mg by mouth daily. Swallow whole.     cholecalciferol (VITAMIN D) 1000 units tablet Take 1,000 Units by mouth daily.      clonazePAM (KLONOPIN) 0.5 MG tablet TAKE ONE TABLET TWICE DAILY AS NEEDED FOR ANXIETY 60 tablet 3   hydrALAZINE (APRESOLINE) 50 MG tablet TAKE 1 tablet BY MOUTH THREE TIMES DAILY 270 tablet 3   losartan (COZAAR) 50 MG tablet Take 1 tablet (Total 50 mg) in the morning (AM) and 1/2 (25 mg) tablet in the evening (PM). 135 tablet 1   metoprolol succinate (TOPROL-XL) 50 MG 24 hr tablet Take 1 tablet (50 mg total) by mouth daily. Take with or immediately following a meal. 90 tablet 3   potassium chloride (KLOR-CON) 8 MEQ tablet Take 1 tablet (8 mEq total) by mouth daily. 90 tablet 3   rosuvastatin (CRESTOR) 40 MG tablet Take 1 tablet (40 mg total) by mouth daily. 90 tablet 1   tadalafil (CIALIS) 20 MG tablet Take 1 tablet (20 mg total) by mouth every three (3) days as needed for erectile dysfunction. 30 tablet 3   traMADol (ULTRAM) 50 MG tablet Take 0.5 to 1 tablets (25-50 mg total) by mouth every 6 (six) hours as needed for severe pain 100 tablet 2   triamterene-hydrochlorothiazide (MAXZIDE-25) 37.5-25 MG tablet Take by mouth daily.     vitamin B-12 (CYANOCOBALAMIN) 1000 MCG tablet Take 1,000 mcg by mouth daily.     No facility-administered medications prior  to visit.    ROS: Review of Systems  Constitutional:  Negative for appetite change, fatigue and unexpected weight change.  HENT:  Negative for congestion, nosebleeds, sneezing, sore throat and trouble swallowing.   Eyes:  Negative for itching and visual disturbance.  Respiratory:  Negative for cough.   Cardiovascular:  Negative for chest pain, palpitations and leg swelling.  Gastrointestinal:  Negative for abdominal distention, blood in stool, diarrhea and nausea.  Genitourinary:  Negative for frequency and hematuria.  Musculoskeletal:  Negative for back pain, gait problem, joint swelling and neck pain.  Skin:  Positive for rash.  Neurological:  Negative for dizziness, tremors, speech difficulty and weakness.  Psychiatric/Behavioral:  Negative for agitation, dysphoric mood and sleep disturbance. The patient is not nervous/anxious.    Objective:  BP 140/72 (BP Location: Right Arm)   Pulse (!) 59   Temp 98.4 F (36.9 C) (Oral)   Ht 5\' 4"  (1.626 m)   Wt 190 lb 12.8 oz (86.5 kg)   SpO2 95%   BMI 32.75 kg/m   BP Readings from Last 3 Encounters:  07/07/21 140/72  07/06/21 133/63  06/25/21 (!) 150/64    Wt Readings from Last 3 Encounters:  07/07/21 190 lb 12.8 oz (86.5 kg)  07/06/21 188 lb 3.2 oz (  85.4 kg)  06/25/21 193 lb 12.8 oz (87.9 kg)    Physical Exam Constitutional:      General: He is not in acute distress.    Appearance: He is well-developed. He is obese.     Comments: NAD  Eyes:     Conjunctiva/sclera: Conjunctivae normal.     Pupils: Pupils are equal, round, and reactive to light.  Neck:     Thyroid: No thyromegaly.     Vascular: No JVD.  Cardiovascular:     Rate and Rhythm: Normal rate and regular rhythm.     Heart sounds: Normal heart sounds. No murmur heard.   No friction rub. No gallop.  Pulmonary:     Effort: Pulmonary effort is normal. No respiratory distress.     Breath sounds: Normal breath sounds. No wheezing or rales.  Chest:     Chest wall: No  tenderness.  Abdominal:     General: Bowel sounds are normal. There is no distension.     Palpations: Abdomen is soft. There is no mass.     Tenderness: There is no abdominal tenderness. There is no guarding or rebound.  Musculoskeletal:        General: No tenderness. Normal range of motion.     Cervical back: Normal range of motion.  Lymphadenopathy:     Cervical: No cervical adenopathy.  Skin:    General: Skin is warm and dry.     Findings: Rash present.  Neurological:     Mental Status: He is alert and oriented to person, place, and time.     Cranial Nerves: No cranial nerve deficit.     Motor: No abnormal muscle tone.     Coordination: Coordination normal.     Gait: Gait normal.     Deep Tendon Reflexes: Reflexes are normal and symmetric.  Psychiatric:        Behavior: Behavior normal.        Thought Content: Thought content normal.        Judgment: Judgment normal.  Purple papules pustules on the face and on the V neck area  Lab Results  Component Value Date   WBC 9.0 06/25/2021   HGB 14.8 06/25/2021   HCT 41.7 06/25/2021   PLT 213 06/25/2021   GLUCOSE 93 06/25/2021   CHOL 132 10/05/2020   TRIG 158.0 (H) 10/05/2020   HDL 53.70 10/05/2020   LDLDIRECT 148.0 06/07/2018   LDLCALC 46 10/05/2020   ALT 25 06/25/2021   AST 24 06/25/2021   NA 134 (L) 06/25/2021   K 4.0 06/25/2021   CL 102 06/25/2021   CREATININE 1.41 (H) 06/25/2021   BUN 24 (H) 06/25/2021   CO2 21 (L) 06/25/2021   TSH 2.62 10/05/2020   PSA 0.86 10/05/2020   INR 1.10 06/22/2015   HGBA1C 5.7 03/06/2017    VAS [place] ABI WITH/WO TBI  Result Date: 07/06/2021  LOWER EXTREMITY DOPPLER STUDY Patient Name:  GENEVIEVE RITZEL  Date of Exam:   07/06/2021 Medical Rec #: [id number]    Accession #:    [id number] Date of Birth: 10-09-1951     Patient Gender: M Patient Age:   25 years Exam Location:  [facility] Procedure:      VAS [place] ABI WITH/WO TBI Referring Phys: Harold Barban  --------------------------------------------------------------------------------  Indications: Peripheral artery disease, and Hip pain. High Risk Factors: Hypertension, hyperlipidemia, past history of smoking,  coronary artery disease. Other Factors: History of CABG.  Vascular Interventions: 08/06/19 right common and external iliac artery                         atherectomy and stent.                         12/11/12 right popliteal atherectomy and stent, right                         fem-(AK)pop BPG. Left EIA, CFA, profunda and SFA                         endarterectomy and PTA.                         01/29/15 left SFA stent.                         2008 right SFA stent (occluded). Comparison Study: 06/30/20 (see table) Performing Technologist: Alvia Grove RVT  Examination Guidelines: A complete evaluation includes at minimum, Doppler waveform signals and systolic blood pressure reading at the level of bilateral brachial, anterior tibial, and posterior tibial arteries, when vessel segments are accessible. Bilateral testing is considered an integral part of a complete examination. Photoelectric Plethysmograph (PPG) waveforms and toe systolic pressure readings are included as required and additional duplex testing as needed. Limited examinations for reoccurring indications may be performed as noted.  ABI Findings: +---------+------------------+-----+--------+--------+ Right    Rt Pressure (mmHg)IndexWaveformComment  +---------+------------------+-----+--------+--------+ Brachial 136                                     +---------+------------------+-----+--------+--------+ PTA                             biphasicNC       +---------+------------------+-----+--------+--------+ DP       152               1.09 biphasic         +---------+------------------+-----+--------+--------+ Great Toe132               0.95 Normal            +---------+------------------+-----+--------+--------+ +---------+------------------+-----+--------+-------+ Left     Lt Pressure (mmHg)IndexWaveformComment +---------+------------------+-----+--------+-------+ Brachial 139                                    +---------+------------------+-----+--------+-------+ PTA      131               0.94 biphasic        +---------+------------------+-----+--------+-------+ DP       162               1.17 biphasic        +---------+------------------+-----+--------+-------+ Great Toe110               0.79 Abnormal        +---------+------------------+-----+--------+-------+ +-------+-----------+-----------+------------+------------+ ABI/TBIToday's ABIToday's TBIPrevious ABIPrevious TBI +-------+-----------+-----------+------------+------------+ Right  1.09       .95        1.12        .96          +-------+-----------+-----------+------------+------------+  Left   1.17       .79        1.02        .91          +-------+-----------+-----------+------------+------------+   Summary: Right: Resting right ankle-brachial index indicates noncompressible right lower extremity arteries. The right toe-brachial index is normal. Left: Resting left ankle-brachial index is within normal range. No evidence of significant left lower extremity arterial disease. The left toe-brachial index is normal.  *See table(s) above for measurements and observations.  Electronically signed by Monica Martinez MD on 07/06/2021 at 9:18:31 AM.    Final    VAS US AORTA/IVC/ILIACS  Result Date: 07/06/2021 ABDOMINAL AORTA STUDY Patient Name:  KUMAR FALWELL  Date of Exam:   07/06/2021 Medical Rec #: [id number]    Accession #:    [id number] Date of Birth: [birth year]     Patient Gender: M Patient Age:   51 years Exam Location:  [facility] Procedure:      VAS US AORTA/IVC/ILIACS Referring Phys: Harold Barban  --------------------------------------------------------------------------------  Indications: Right stent evaluation. Risk Factors: Hypertension, hyperlipidemia, past history of smoking, coronary               artery disease. Vascular Interventions: 08/06/2019 Right CIA/EIA arterectomy and stenting; right                         popliteal artery artherectomy and stenting                          12/11/2012 Right femoral to AK popliteal bypass; Left                         EIA, CFA, Profunda and SFA endarterectomy and                         angioplasty                          01/29/2015 left superficial femoral stent                          2008 right SFA stent (occluded). Limitations: Air/bowel gas, obesity, patient discomfort and Breathing artifact.  Performing Technologist: Alvia Grove RVT  Examination Guidelines: A complete evaluation includes B-mode imaging, spectral Doppler, color Doppler, and power Doppler as needed of all accessible portions of each vessel. Bilateral testing is considered an integral part of a complete examination. Limited examinations for reoccurring indications may be performed as noted.  Abdominal Aorta Findings: +-------------+-------+----------+----------+----------+--------+--------+ Location     AP (cm)Trans (cm)PSV (cm/s)Waveform  ThrombusComments +-------------+-------+----------+----------+----------+--------+--------+ Proximal                      79        biphasic                   +-------------+-------+----------+----------+----------+--------+--------+ Mid                           96        biphasic                   +-------------+-------+----------+----------+----------+--------+--------+ Distal  75        biphasic                   +-------------+-------+----------+----------+----------+--------+--------+ RT CIA Prox                   67        biphasic                    +-------------+-------+----------+----------+----------+--------+--------+ RT EIA Prox                   79        biphasic                   +-------------+-------+----------+----------+----------+--------+--------+ RT EIA Mid                    206       triphasic                  +-------------+-------+----------+----------+----------+--------+--------+ RT EIA Distal                 358       biphasic                   +-------------+-------+----------+----------+----------+--------+--------+ LT CIA Prox                   81        monophasic                 +-------------+-------+----------+----------+----------+--------+--------+ LT CIA Mid                    75        biphasic                   +-------------+-------+----------+----------+----------+--------+--------+ LT CIA Distal                 88        biphasic                   +-------------+-------+----------+----------+----------+--------+--------+ LT EIA Prox                   93        biphasic                   +-------------+-------+----------+----------+----------+--------+--------+ LT EIA Mid                    416       biphasic                   +-------------+-------+----------+----------+----------+--------+--------+ LT EIA Distal                 348       biphasic                   +-------------+-------+----------+----------+----------+--------+--------+  Summary: Right; Increased velocity in the distal EIA area suggestive of 50 - 99% stenosis range. Left: Increased velocity in the mid to distal EIA suggestive of 50 = 99% stenosis range. NOTE: This was a technically difficult exam. Unable to visualize stent walls. Further imaging modality may be warranted.  *See table(s) above for measurements and observations.  Electronically signed by Monica Martinez MD on 07/06/2021 at 3:39:52 PM.    Final     Assessment & Plan:    [name], MD

## 2021-07-07 NOTE — Patient Instructions (Addendum)
Stop Hydralazine Avoid sun

## 2021-07-07 NOTE — Assessment & Plan Note (Signed)
Continue with clonazepam as needed 

## 2021-07-07 NOTE — Assessment & Plan Note (Addendum)
8/22 Likely due to hydralazine D/c hydralazine Avoid sun exposure Medrol pack Clinda cream Doxy x 7 d for possible secondary skin infection

## 2021-07-12 ENCOUNTER — Other Ambulatory Visit: Payer: Self-pay | Admitting: Internal Medicine

## 2021-07-24 NOTE — Progress Notes (Signed)
Remote pacemaker transmission.   

## 2021-08-05 DIAGNOSIS — H35373 Puckering of macula, bilateral: Secondary | ICD-10-CM | POA: Diagnosis not present

## 2021-08-05 DIAGNOSIS — H4321 Crystalline deposits in vitreous body, right eye: Secondary | ICD-10-CM | POA: Diagnosis not present

## 2021-08-05 DIAGNOSIS — H02834 Dermatochalasis of left upper eyelid: Secondary | ICD-10-CM | POA: Diagnosis not present

## 2021-08-05 DIAGNOSIS — Z961 Presence of intraocular lens: Secondary | ICD-10-CM | POA: Diagnosis not present

## 2021-08-05 DIAGNOSIS — H02831 Dermatochalasis of right upper eyelid: Secondary | ICD-10-CM | POA: Diagnosis not present

## 2021-08-11 DIAGNOSIS — H5213 Myopia, bilateral: Secondary | ICD-10-CM | POA: Diagnosis not present

## 2021-08-11 DIAGNOSIS — H52209 Unspecified astigmatism, unspecified eye: Secondary | ICD-10-CM | POA: Diagnosis not present

## 2021-08-11 DIAGNOSIS — H524 Presbyopia: Secondary | ICD-10-CM | POA: Diagnosis not present

## 2021-08-12 ENCOUNTER — Ambulatory Visit: Payer: Medicare HMO | Admitting: Internal Medicine

## 2021-09-16 ENCOUNTER — Other Ambulatory Visit: Payer: Self-pay | Admitting: Cardiovascular Disease

## 2021-09-16 ENCOUNTER — Other Ambulatory Visit: Payer: Self-pay | Admitting: Internal Medicine

## 2021-09-21 ENCOUNTER — Other Ambulatory Visit: Payer: Self-pay | Admitting: Internal Medicine

## 2021-09-26 ENCOUNTER — Emergency Department (HOSPITAL_COMMUNITY): Payer: Medicare HMO

## 2021-09-26 ENCOUNTER — Encounter (HOSPITAL_COMMUNITY): Payer: Self-pay

## 2021-09-26 ENCOUNTER — Emergency Department (HOSPITAL_COMMUNITY)
Admission: EM | Admit: 2021-09-26 | Discharge: 2021-09-26 | Disposition: A | Discharge status: Home / Self Care | Payer: Medicare HMO | Attending: Emergency Medicine | Admitting: Emergency Medicine

## 2021-09-26 ENCOUNTER — Other Ambulatory Visit: Payer: Self-pay

## 2021-09-26 DIAGNOSIS — M545 Low back pain, unspecified: Secondary | ICD-10-CM

## 2021-09-26 DIAGNOSIS — I129 Hypertensive chronic kidney disease with stage 1 through stage 4 chronic kidney disease, or unspecified chronic kidney disease: Secondary | ICD-10-CM | POA: Insufficient documentation

## 2021-09-26 DIAGNOSIS — Z951 Presence of aortocoronary bypass graft: Secondary | ICD-10-CM | POA: Diagnosis not present

## 2021-09-26 DIAGNOSIS — Z96642 Presence of left artificial hip joint: Secondary | ICD-10-CM | POA: Insufficient documentation

## 2021-09-26 DIAGNOSIS — Z7982 Long term (current) use of aspirin: Secondary | ICD-10-CM | POA: Insufficient documentation

## 2021-09-26 DIAGNOSIS — I2510 Atherosclerotic heart disease of native coronary artery without angina pectoris: Secondary | ICD-10-CM | POA: Insufficient documentation

## 2021-09-26 DIAGNOSIS — Z87891 Personal history of nicotine dependence: Secondary | ICD-10-CM | POA: Diagnosis not present

## 2021-09-26 DIAGNOSIS — I10 Essential (primary) hypertension: Secondary | ICD-10-CM | POA: Diagnosis not present

## 2021-09-26 DIAGNOSIS — N281 Cyst of kidney, acquired: Secondary | ICD-10-CM | POA: Diagnosis not present

## 2021-09-26 DIAGNOSIS — R61 Generalized hyperhidrosis: Secondary | ICD-10-CM | POA: Diagnosis not present

## 2021-09-26 DIAGNOSIS — I701 Atherosclerosis of renal artery: Secondary | ICD-10-CM | POA: Diagnosis not present

## 2021-09-26 DIAGNOSIS — J441 Chronic obstructive pulmonary disease with (acute) exacerbation: Secondary | ICD-10-CM | POA: Diagnosis not present

## 2021-09-26 DIAGNOSIS — N2889 Other specified disorders of kidney and ureter: Secondary | ICD-10-CM | POA: Diagnosis not present

## 2021-09-26 DIAGNOSIS — I700 Atherosclerosis of aorta: Secondary | ICD-10-CM | POA: Diagnosis not present

## 2021-09-26 DIAGNOSIS — M5459 Other low back pain: Secondary | ICD-10-CM | POA: Diagnosis not present

## 2021-09-26 DIAGNOSIS — K900 Celiac disease: Secondary | ICD-10-CM | POA: Diagnosis not present

## 2021-09-26 DIAGNOSIS — R109 Unspecified abdominal pain: Secondary | ICD-10-CM | POA: Diagnosis not present

## 2021-09-26 DIAGNOSIS — Z85828 Personal history of other malignant neoplasm of skin: Secondary | ICD-10-CM | POA: Insufficient documentation

## 2021-09-26 DIAGNOSIS — M549 Dorsalgia, unspecified: Secondary | ICD-10-CM

## 2021-09-26 DIAGNOSIS — I959 Hypotension, unspecified: Secondary | ICD-10-CM | POA: Diagnosis not present

## 2021-09-26 DIAGNOSIS — Z79899 Other long term (current) drug therapy: Secondary | ICD-10-CM | POA: Diagnosis not present

## 2021-09-26 DIAGNOSIS — N184 Chronic kidney disease, stage 4 (severe): Secondary | ICD-10-CM | POA: Diagnosis not present

## 2021-09-26 DIAGNOSIS — J9811 Atelectasis: Secondary | ICD-10-CM | POA: Diagnosis not present

## 2021-09-26 DIAGNOSIS — I745 Embolism and thrombosis of iliac artery: Secondary | ICD-10-CM | POA: Diagnosis not present

## 2021-09-26 LAB — COMPREHENSIVE METABOLIC PANEL
ALT: 54 U/L — ABNORMAL HIGH (ref 0–44)
AST: 50 U/L — ABNORMAL HIGH (ref 15–41)
Albumin: 3.6 g/dL (ref 3.5–5.0)
Alkaline Phosphatase: 44 U/L (ref 38–126)
Anion gap: 10 (ref 5–15)
BUN: 26 mg/dL — ABNORMAL HIGH (ref 8–23)
CO2: 21 mmol/L — ABNORMAL LOW (ref 22–32)
Calcium: 9.1 mg/dL (ref 8.9–10.3)
Chloride: 104 mmol/L (ref 98–111)
Creatinine, Ser: 1.63 mg/dL — ABNORMAL HIGH (ref 0.61–1.24)
GFR, Estimated: 45 mL/min — ABNORMAL LOW (ref >60)
Glucose, Bld: 96 mg/dL (ref 70–99)
Potassium: 3.7 mmol/L (ref 3.5–5.1)
Sodium: 135 mmol/L (ref 135–145)
Total Bilirubin: 1.2 mg/dL (ref 0.3–1.2)
Total Protein: 6.6 g/dL (ref 6.5–8.1)

## 2021-09-26 LAB — CBC WITH DIFFERENTIAL/PLATELET
Abs Immature Granulocytes: 0.06 10*3/uL (ref 0.00–0.07)
Basophils Absolute: 0.1 10*3/uL (ref 0.0–0.1)
Basophils Relative: 1 %
Eosinophils Absolute: 0.2 10*3/uL (ref 0.0–0.5)
Eosinophils Relative: 1 %
HCT: 41 % (ref 39.0–52.0)
Hemoglobin: 14.3 g/dL (ref 13.0–17.0)
Immature Granulocytes: 0 %
Lymphocytes Relative: 11 %
Lymphs Abs: 1.5 10*3/uL (ref 0.7–4.0)
MCH: 34.4 pg — ABNORMAL HIGH (ref 26.0–34.0)
MCHC: 34.9 g/dL (ref 30.0–36.0)
MCV: 98.6 fL (ref 80.0–100.0)
Monocytes Absolute: 1.3 10*3/uL — ABNORMAL HIGH (ref 0.1–1.0)
Monocytes Relative: 9 %
Neutro Abs: 10.5 10*3/uL — ABNORMAL HIGH (ref 1.7–7.7)
Neutrophils Relative %: 78 %
Platelets: 252 10*3/uL (ref 150–400)
RBC: 4.16 MIL/uL — ABNORMAL LOW (ref 4.22–5.81)
RDW: 13.1 % (ref 11.5–15.5)
WBC: 13.6 10*3/uL — ABNORMAL HIGH (ref 4.0–10.5)
nRBC: 0 % (ref 0.0–0.2)

## 2021-09-26 LAB — URINALYSIS, ROUTINE W REFLEX MICROSCOPIC
Bilirubin Urine: NEGATIVE
Glucose, UA: NEGATIVE mg/dL
Hgb urine dipstick: NEGATIVE
Ketones, ur: NEGATIVE mg/dL
Leukocytes,Ua: NEGATIVE
Nitrite: NEGATIVE
Protein, ur: NEGATIVE mg/dL
Specific Gravity, Urine: 1.015 (ref 1.005–1.030)
pH: 5 (ref 5.0–8.0)

## 2021-09-26 LAB — I-STAT CHEM 8, ED
BUN: 27 mg/dL — ABNORMAL HIGH (ref 8–23)
Calcium, Ion: 1.2 mmol/L (ref 1.15–1.40)
Chloride: 104 mmol/L (ref 98–111)
Creatinine, Ser: 1.6 mg/dL — ABNORMAL HIGH (ref 0.61–1.24)
Glucose, Bld: 96 mg/dL (ref 70–99)
HCT: 42 % (ref 39.0–52.0)
Hemoglobin: 14.3 g/dL (ref 13.0–17.0)
Potassium: 3.9 mmol/L (ref 3.5–5.1)
Sodium: 138 mmol/L (ref 135–145)
TCO2: 22 mmol/L (ref 22–32)

## 2021-09-26 MED ORDER — MORPHINE SULFATE (PF) 4 MG/ML IV SOLN: 4.0000 mg | Freq: Once | INTRAVENOUS | Status: DC

## 2021-09-26 MED ORDER — IOHEXOL 350 MG/ML SOLN
100.0000 mL | Freq: Once | INTRAVENOUS | Status: AC | PRN
  Administered 2021-09-26: 100 mL via INTRAVENOUS

## 2021-09-26 MED ORDER — SODIUM CHLORIDE 0.9 % IV BOLUS
1000.0000 mL | Freq: Once | INTRAVENOUS | Status: AC
  Administered 2021-09-26: 1000 mL via INTRAVENOUS

## 2021-09-26 MED ORDER — ACETAMINOPHEN 325 MG PO TABS
650.0000 mg | ORAL_TABLET | Freq: Once | ORAL | Status: AC
  Administered 2021-09-26: 650 mg via ORAL
  Filled 2021-09-26: qty 2

## 2021-09-26 MED ORDER — CYCLOBENZAPRINE HCL 10 MG PO TABS: 10.0000 mg | ORAL_TABLET | Freq: Two times a day (BID) | ORAL | 0 refills | Status: DC | PRN

## 2021-09-26 NOTE — ED Notes (Signed)
ED PA at BS 

## 2021-09-26 NOTE — ED Notes (Signed)
Pt in CT.

## 2021-09-26 NOTE — ED Triage Notes (Signed)
Patient complains of left flank pain that started yesterday. Denies trauma, denies dysuria. Denies fever.

## 2021-09-26 NOTE — ED Notes (Signed)
EDPA into see, at BS.   

## 2021-09-26 NOTE — ED Notes (Signed)
Patient verbalizes understanding of discharge instructions. Opportunity for questioning and answers were provided. Armband removed by staff, pt discharged from ED ambulatory w/ support person

## 2021-09-26 NOTE — Discharge Instructions (Addendum)
You have been evaluated for your back pain.  This is likely a musculoskeletal pain.  Take Flexeril as needed for discomfort.  You may also take Tylenol for pain.  You do have evidence of severe plaque buildup in your blood vessel.  Please follow-up closely with your vascular surgeon for further management.  Return if you have any concern.
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
## 2021-09-26 NOTE — ED Provider Notes (Signed)
Emergency Medicine Provider Triage Evaluation Note  TRAE BOVENZI , a 70 y.o. male  was evaluated in triage.  Pt complains of back pain, pain in leg. Patient reports pain started this morning, unable to walk without severe pain. No shooting pain or numbness or tingling. Hx of kidney stones, but patient reports this feels different, no dysuria, n/v, hematuria. No saddle anesthesia.  Review of Systems  Positive: Back pain, leg pain Negative: Numbness, tingling  Physical Exam  BP (!) 146/67 (BP Location: Left Arm)   Pulse 85   Temp 98 F (36.7 C)   Resp 17   SpO2 97%  Gen:   Awake, no distress   Resp:  Normal effort  MSK:   Decreased strength 3/5 in LLE to flexion / extension Other:  No midline spinal tenderness, there is lumbar paraspinous tenderness on left side, no left CVA tenderness  Medical Decision Making  Medically screening exam initiated at 2:02 PM.  Appropriate orders placed.  SHIMSHON NARULA was informed that the remainder of the evaluation will be completed by another provider, this initial triage assessment does not replace that evaluation, and the importance of remaining in the ED until their evaluation is complete.  Back pain   Anselmo Pickler, [place] 09/26/21 1405    Tegeler, Gwenyth Allegra, MD 09/26/21 [phone]

## 2021-09-28 ENCOUNTER — Ambulatory Visit (INDEPENDENT_AMBULATORY_CARE_PROVIDER_SITE_OTHER): Payer: Medicare HMO

## 2021-09-28 DIAGNOSIS — I442 Atrioventricular block, complete: Secondary | ICD-10-CM

## 2021-09-30 ENCOUNTER — Ambulatory Visit (HOSPITAL_COMMUNITY)
Admission: RE | Admit: 2021-09-30 | Discharge: 2021-09-30 | Disposition: A | Discharge status: Home / Self Care | Payer: Medicare HMO | Source: Ambulatory Visit | Attending: Physician Assistant | Admitting: Physician Assistant

## 2021-09-30 ENCOUNTER — Other Ambulatory Visit: Payer: Self-pay

## 2021-09-30 DIAGNOSIS — I739 Peripheral vascular disease, unspecified: Secondary | ICD-10-CM | POA: Insufficient documentation

## 2021-10-01 ENCOUNTER — Other Ambulatory Visit: Payer: Self-pay

## 2021-10-01 ENCOUNTER — Ambulatory Visit: Payer: Medicare HMO | Admitting: Physician Assistant

## 2021-10-01 VITALS — BP 142/72 | HR 84 | Temp 98.3°F | Resp 20 | Ht 64.0 in | Wt 186.9 lb

## 2021-10-01 DIAGNOSIS — I779 Disorder of arteries and arterioles, unspecified: Secondary | ICD-10-CM

## 2021-10-01 LAB — CUP PACEART REMOTE DEVICE CHECK
Battery Remaining Longevity: 120 mo
Battery Voltage: 3.02 V
Brady Statistic AP VP Percent: 30.95 %
Brady Statistic AP VS Percent: 0.01 %
Brady Statistic AS VP Percent: 60.33 %
Brady Statistic AS VS Percent: 8.71 %
Brady Statistic RA Percent Paced: 33.44 %
Brady Statistic RV Percent Paced: 91.29 %
Date Time Interrogation Session: 20221104054129
Implantable Lead Implant Date: 20201231
Implantable Lead Implant Date: 20201231
Implantable Lead Location: 753859
Implantable Lead Location: 753860
Implantable Lead Model: 3830
Implantable Lead Model: 5076
Implantable Pulse Generator Implant Date: 20201231
Lead Channel Impedance Value: 361 Ohm
Lead Channel Impedance Value: 418 Ohm
Lead Channel Impedance Value: 513 Ohm
Lead Channel Impedance Value: 551 Ohm
Lead Channel Pacing Threshold Amplitude: 0.625 V
Lead Channel Pacing Threshold Amplitude: 0.875 V
Lead Channel Pacing Threshold Pulse Width: 0.4 ms
Lead Channel Pacing Threshold Pulse Width: 0.4 ms
Lead Channel Sensing Intrinsic Amplitude: 20.625 mV
Lead Channel Sensing Intrinsic Amplitude: 20.625 mV
Lead Channel Sensing Intrinsic Amplitude: 6.5 mV
Lead Channel Sensing Intrinsic Amplitude: 6.5 mV
Lead Channel Setting Pacing Amplitude: 1.5 V
Lead Channel Setting Pacing Amplitude: 2.5 V
Lead Channel Setting Pacing Pulse Width: 0.4 ms
Lead Channel Setting Sensing Sensitivity: 1.2 mV

## 2021-10-01 NOTE — Progress Notes (Signed)
[facility] [place] HISTORY AND PHYSICAL   History of Present Illness:  Patient is a 70 y.o. year old male who presents for evaluation of peripheral artery disease. He most recently underwent atherectomy and stent placement of the right common iliac artery, external iliac artery and popliteal artery on 08/06/19 by Dr. Trula Slade for disabling claudication.   He has history of left femoral endarterectomy with vein patch angioplasty, atherectomy and stenting of left iliac and superficial femoral arteries on 01/29/15 by Dr. Trula Slade. Also has had prior right common femoral endarterectomy and femoral to above knee popliteal artery saphenous vein bypass graft on 12/10/12 by Dr. Trula Slade. Both of these were performed due to lifestyle limiting claudication   He was seen in the ED recently for lumbar/sever left buttock pain that limited his walking to just a few steps. pain and a  CTA was performed to r/o AAA showing no evidence of AAA, but he has Severe atherosclerotic calcifications involving both common iliac arteries.    He denise new rest pain in the B LE, non healing ulcers, or LE claudication.    The pt is on a statin for cholesterol management.  The pt is on a daily aspirin.   Other AC:  none The pt is on ARB, BB, Hydralazine for hypertension.   The pt is diabetic.  Tobacco hx:  Former, quit 2008  Past Medical History:  Diagnosis Date   Anxiety    Arthritis    CAD (coronary artery disease)    Mild plaque (cath "years ago"); abnormal Myoview 04/2013 with subsequent CABG x 5 with LIMA to LAD, SVG to OM1, SVG to DX, SVG to PD & PL.    Carotid artery disease ([place])    Carotid US 07/2019 bilateral ICA 40-59; bilateral subclavian stenosis // Carotid US 9/21: Bilateral ICA 40-59; L VA occluded; R subclavian stenosis   Cataract    GERD (gastroesophageal reflux disease)    History of colonic polyps    History of echocardiogram    Echo 2/19: Mild LVH, EF 60-65, normal wall motion, grade  2 diastolic dysfunction, mild RAE   History of nuclear stress test    Myoview 2/19: inf/inf-lat/apical inf/ant-sept defect (?diaph atten - cannot rule out peri-infarct ischemia), PVCs/PACs/Mobitz 1 // Myoview 05/2019: EF 60, inf infarct with mild peri-infarct ischemia; no significant change when compared to prior study; Intermediate Risk   Hx of echocardiogram    Echo (9/15):  EF 55-60%; Gr 2 DD, mild BAE   Hyperlipidemia    Hypertension    LBP (low back pain)    Meralgia paresthetica of left side 2011   Polycythemia, secondary 02/03/2020   PVD (peripheral vascular disease) ([place])    Stent to left common femoral and right superficial femoral.  2008.  50%  left renal    Second degree AV block, Mobitz type I    Holter 2/19: Sinus rhythm, average heart rate 72, frequent PVCs (burden 5%), second-degree type I AV block and periods of 2:1 heart block >> continue clinical managment and avoid AVN blocking agents   Shortness of breath    "once in awhile; can happen at anytime" (08/26/2013)   Sleep apnea    mod OSA, central sleep apnea/hypoapnea syndrome 11/22/12, CPAP every night    Subclavian artery stenosis ([place])    Carotid US 07/2019 bilateral ICA 40-59; bilateral subclavian stenosis   Vitamin D deficiency     Past Surgical History:  Procedure Laterality Date   ABDOMINAL AORTAGRAM N/A 11/16/2011  Procedure: ABDOMINAL Maxcine Ham;  Surgeon: Sherren Mocha, MD;  Location: [facility] CATH LAB;  Service: Cardiovascular;  Laterality: N/A;   ABDOMINAL AORTAGRAM N/A 11/14/2012   Procedure: ABDOMINAL Maxcine Ham;  Surgeon: Sherren Mocha, MD;  Location: [facility] CATH LAB;  Service: Cardiovascular;  Laterality: N/A;   ABDOMINAL AORTAGRAM N/A 12/30/2014   Procedure: ABDOMINAL Maxcine Ham;  Surgeon: Serafina Mitchell, MD;  Location: [facility] CATH LAB;  Service: Cardiovascular;  Laterality: N/A;   ABDOMINAL AORTOGRAM W/LOWER EXTREMITY Bilateral 08/06/2019   Procedure: ABDOMINAL AORTOGRAM W/LOWER EXTREMITY;  Surgeon: Serafina Mitchell, MD;   Location: [place] CV LAB;  Service: Cardiovascular;  Laterality: Bilateral;   CARDIAC CATHETERIZATION  05/02/13   x2    CORONARY ARTERY BYPASS GRAFT N/A 05/06/2013   Procedure: CORONARY ARTERY BYPASS GRAFTING (CABG);  Surgeon: Melrose Nakayama, MD;  Location: [place];  Service: Open Heart Surgery;  Laterality: N/A;  Coronary artery bypass graft times five using left internal mammary artery and left greater saphenous vein via endovein harvest.   ENDARTERECTOMY FEMORAL Left 01/29/2015   Procedure: ENDARTERECTOMY FEMORAL WITH PATCH ANGIOPLASTY;  Surgeon: Serafina Mitchell, MD;  Location: [facility] OR;  Service: Vascular;  Laterality: Left;   EYE SURGERY  03/24/16   cataract surgery on left eye   FEMORAL-POPLITEAL BYPASS GRAFT  12/27/2012   Procedure: BYPASS GRAFT FEMORAL-POPLITEAL ARTERY;  Surgeon: Serafina Mitchell, MD;  Location: [facility] OR;  Service: Vascular;  Laterality: Right;  using non-reversed sapphenous vein.   ILIAC ATHERECTOMY Left 01/29/2015   Procedure: SUPERFICIAL FEMORAL ARTERY ATHERECTOMY/PERCUTANEOUS TRANSLUMINAL ANGIOPLASTY; superficial femoral artery stent;  Surgeon: Serafina Mitchell, MD;  Location: [place];  Service: Vascular;  Laterality: Left;   LOWER EXTREMITY ANGIOGRAM Bilateral 11/16/2011   Procedure: LOWER EXTREMITY ANGIOGRAM;  Surgeon: Sherren Mocha, MD;  Location: [facility] CATH LAB;  Service: Cardiovascular;  Laterality: Bilateral;   lower extremity stents     bilateral lower extremities x 2   PACEMAKER IMPLANT N/A 11/28/2019   Procedure: PACEMAKER IMPLANT;  Surgeon: Thompson Grayer, MD;  Location: [place] CV LAB;  Service: Cardiovascular;  Laterality: N/A;   PERCUTANEOUS STENT INTERVENTION Left 11/16/2011   Procedure: PERCUTANEOUS STENT INTERVENTION;  Surgeon: Sherren Mocha, MD;  Location: [facility] CATH LAB;  Service: Cardiovascular;  Laterality: Left;   PERIPHERAL VASCULAR ATHERECTOMY Right 08/06/2019   Procedure: PERIPHERAL VASCULAR ATHERECTOMY;  Surgeon: Serafina Mitchell, MD;  Location: [place] CV LAB;  Service: Cardiovascular;  Laterality: Right;  Common iliac, external iliac, and popliteal.   PERIPHERAL VASCULAR INTERVENTION Right 08/06/2019   Procedure: PERIPHERAL VASCULAR INTERVENTION;  Surgeon: Serafina Mitchell, MD;  Location: [place] CV LAB;  Service: Cardiovascular;  Laterality: Right;  common iliac, external iliac, and popliteal.   TONSILLECTOMY     TOTAL HIP ARTHROPLASTY Left 06/29/2015   Procedure: TOTAL HIP ARTHROPLASTY;  Surgeon: Frederik Pear, MD;  Location: [place];  Service: Orthopedics;  Laterality: Left;  LEFT TOTAL HIP ARTHROPLASTY DEPUY SROM/PINNACLE    ROS:   General:  No weight loss, Fever, chills  HEENT: No recent headaches, no nasal bleeding, no visual changes, no sore throat  Neurologic: No dizziness, blackouts, seizures. No recent symptoms of stroke or mini- stroke. No recent episodes of slurred speech, or temporary blindness.  Cardiac: No recent episodes of chest pain/pressure, no shortness of breath at rest.  No shortness of breath with exertion.  Denies history of atrial fibrillation or irregular heartbeat  Vascular: No history of rest pain in feet.  positive history of claudication.  No history of  non-healing ulcer, No history of DVT   Pulmonary: No home oxygen, no productive cough, no hemoptysis,  No asthma or wheezing  Musculoskeletal:  [ ]  Arthritis, [ ]  Low back pain,  [ ]  Joint pain  Hematologic:No history of hypercoagulable state.  No history of easy bleeding.  No history of anemia  Gastrointestinal: No hematochezia or melena,  No gastroesophageal reflux, no trouble swallowing  Urinary: [ ]  chronic Kidney disease, [ ]  on HD - [ ]  MWF or [ ]  TTHS, [ ]  Burning with urination, [ ]  Frequent urination, [ ]  Difficulty urinating;   Skin: No rashes  Psychological: No history of anxiety,  No history of depression  Social History Social History   Tobacco Use   Smoking status: Former    Packs/day: 2.00    Years: 47.00    Pack years:  94.00    Types: Cigarettes    Quit date: 12/17/2012    Years since quitting: 8.7   Smokeless tobacco: Never  Vaping Use   Vaping Use: Never used  Substance Use Topics   Alcohol use: Yes    Alcohol/week: 4.0 standard drinks    Types: 4 Shots of liquor per week    Comment: 08/26/2013 "3-4 mixed drinks/wk"   Drug use: No    Family History Family History  Problem Relation Age of Onset   Heart disease Mother        No clear CAD and Heart Disease before age 65   Hypertension Mother    Cancer Mother        ? colon ca   Hyperlipidemia Mother    Cancer Father        brain tumor   Alzheimer's disease Father    Colon cancer Neg Hx    Heart attack Neg Hx    Esophageal cancer Neg Hx    Liver cancer Neg Hx    Rectal cancer Neg Hx    Stomach cancer Neg Hx    Pancreatic cancer Neg Hx     Allergies  Allergies  Allergen Reactions   Roxicodone [Oxycodone Hcl] Other (See Comments)    hallucinations   Hydralazine     Skin lupus type reaction   Benazepril Cough   Gabapentin Rash   Itraconazole Nausea Only and Rash   Lipitor [Atorvastatin Calcium]     cramps     Current Outpatient Medications  Medication Sig Dispense Refill   acetaminophen (TYLENOL) 650 MG CR tablet Take 1,300 mg by mouth every 8 (eight) hours as needed for pain.     aspirin EC 81 MG tablet Take 81 mg by mouth daily. Swallow whole.     cholecalciferol (VITAMIN D) 1000 units tablet Take 1,000 Units by mouth daily.      clindamycin (CLEOCIN-T) 1 % lotion Apply topically 2 (two) times daily. 120 mL 2   clonazePAM (KLONOPIN) 0.5 MG tablet TAKE ONE TABLET TWICE DAILY AS NEEDED FOR ANXIETY 60 tablet 3   doxycycline (VIBRA-TABS) 100 MG tablet Take 1 tablet (100 mg total) by mouth 2 (two) times daily. 14 tablet 1   losartan (COZAAR) 50 MG tablet Take 1 tablet (Total 50 mg) in the morning (AM) and 1/2 (25 mg) tablet in the evening (PM). 135 tablet 1   methylPREDNISolone (MEDROL DOSEPAK) 4 MG TBPK tablet As directed 21  tablet 0   metoprolol succinate (TOPROL-XL) 50 MG 24 hr tablet Take 1 tablet (50 mg total) by mouth daily. Take with or immediately following a meal. 90 tablet 3  potassium chloride (KLOR-CON) 8 MEQ tablet Take 1 tablet (8 mEq total) by mouth daily. 90 tablet 3   rosuvastatin (CRESTOR) 40 MG tablet Take 1 tablet (40 mg total) by mouth daily. 90 tablet 1   tadalafil (CIALIS) 20 MG tablet Take 1 tablet (20 mg total) by mouth every three (3) days as needed for erectile dysfunction. 30 tablet 3   traMADol (ULTRAM) 50 MG tablet Take 0.5 to 1 tablets (25-50 mg total) by mouth every 6 (six) hours as needed for severe pain 100 tablet 2   triamterene-hydrochlorothiazide (MAXZIDE-25) 37.5-25 MG tablet Take 2 tablets by mouth daily. Annual appt due in Nov must see provider for future refills 60 tablet 0   vitamin B-12 (CYANOCOBALAMIN) 1000 MCG tablet Take 1,000 mcg by mouth daily.     cyclobenzaprine (FLEXERIL) 10 MG tablet Take 1 tablet (10 mg total) by mouth 2 (two) times daily as needed for muscle spasms. (Patient not taking: Reported on 10/01/2021) 20 tablet 0   No current facility-administered medications for this visit.    Physical Examination  Vitals:   10/01/21 0930  BP: (!) 142/72  Pulse: 84  Resp: 20  Temp: 98.3 F (36.8 C)  TempSrc: Temporal  SpO2: 100%  Weight: 186 lb 14.4 oz (84.8 kg)  Height: 5\' 4"  (1.626 m)    Body mass index is 32.08 kg/m.  General:  Alert and oriented, no acute distress HEENT: Normal Neck: No bruit or JVD Pulmonary: Clear to auscultation bilaterally Cardiac: Regular Rate and Rhythm without murmur Abdomen: Soft, non-tender, non-distended, no mass, no scars Skin: No rash Extremity Pulses:  2+ radial, brachial, femoral, dorsalis pedis pulses bilaterally Musculoskeletal: No deformity or edema  Neurologic: Upper and lower extremity motor 5/5 and symmetric  DATA:  +-------+-----------+-----------+------------+------------+  ABI/TBIToday's ABIToday's  TBIPrevious ABIPrevious TBI  +-------+-----------+-----------+------------+------------+  Right  1.12       0.90       1.09        0.95          +-------+-----------+-----------+------------+------------+  Left   1.05       0.77       1.17        0.79          +-------+-----------+-----------+------------+------------+   CTA 09/26/21 IMPRESSION: 1. Severe/advanced atherosclerotic calcifications involving the thoracic and abdominal aorta and branch vessels but no focal aneurysm or dissection. Severe stenosis at the origin of the left external iliac artery and occlusion of the left internal iliac artery. 2. Surgical changes from coronary artery bypass surgery. 3. No acute pulmonary findings. 4. No acute abdominal/pelvic findings, mass lesions or lymphadenopathy.   Aortic Atherosclerosis (ICD10-I70.0).    ASSESSMENT:  PAD with significant atherosclerosis in the Aorta and B iliac arterial He reported to the ED for sever left buttock pain with ambulation.  He needed help to get to the ED and was unable to walk more than 10-15 ft.   S/P Aortogram 08/05/21 with arthrectomy right CIA, right ext iliac artery followed by stenting. His ABI's are stable with palpable DP pulses B.    PLAN: I have scheduled him for aortogram with possible intervention.  The CTA performed states he has significant  stenosis in the left ext. Iliac and occluded left internal iliac.     Roxy Horseman PA-C [facility] [place] Office: [phone]  MD in clinic [place]

## 2021-10-05 NOTE — Progress Notes (Signed)
Remote pacemaker transmission.   

## 2021-10-06 ENCOUNTER — Encounter (HOSPITAL_COMMUNITY): Payer: Medicare HMO

## 2021-10-06 ENCOUNTER — Ambulatory Visit: Payer: Medicare HMO

## 2021-10-07 ENCOUNTER — Inpatient Hospital Stay (HOSPITAL_COMMUNITY): Admission: RE | Admit: 2021-10-07 | Payer: Medicare HMO | Source: Ambulatory Visit

## 2021-10-07 ENCOUNTER — Encounter (HOSPITAL_COMMUNITY): Payer: Medicare HMO

## 2021-10-08 ENCOUNTER — Ambulatory Visit: Payer: Medicare HMO

## 2021-10-27 DIAGNOSIS — Z20822 Contact with and (suspected) exposure to covid-19: Secondary | ICD-10-CM | POA: Diagnosis not present

## 2021-10-27 DIAGNOSIS — B349 Viral infection, unspecified: Secondary | ICD-10-CM | POA: Insufficient documentation

## 2021-10-27 DIAGNOSIS — R051 Acute cough: Secondary | ICD-10-CM | POA: Diagnosis not present

## 2021-11-02 ENCOUNTER — Other Ambulatory Visit: Payer: Self-pay

## 2021-11-02 ENCOUNTER — Ambulatory Visit (HOSPITAL_COMMUNITY)
Admission: RE | Admit: 2021-11-02 | Discharge: 2021-11-02 | Disposition: A | Discharge status: Home / Self Care | Payer: Medicare HMO | Attending: Surgery | Admitting: Surgery

## 2021-11-02 ENCOUNTER — Encounter (HOSPITAL_COMMUNITY)
Admission: RE | Disposition: A | Discharge status: Home / Self Care | Payer: Self-pay | Source: Home / Self Care | Attending: Surgery

## 2021-11-02 DIAGNOSIS — M79605 Pain in left leg: Secondary | ICD-10-CM | POA: Diagnosis not present

## 2021-11-02 DIAGNOSIS — I70212 Atherosclerosis of native arteries of extremities with intermittent claudication, left leg: Secondary | ICD-10-CM | POA: Insufficient documentation

## 2021-11-02 DIAGNOSIS — I70203 Unspecified atherosclerosis of native arteries of extremities, bilateral legs: Secondary | ICD-10-CM | POA: Diagnosis not present

## 2021-11-02 DIAGNOSIS — I700 Atherosclerosis of aorta: Secondary | ICD-10-CM | POA: Diagnosis not present

## 2021-11-02 DIAGNOSIS — E1151 Type 2 diabetes mellitus with diabetic peripheral angiopathy without gangrene: Secondary | ICD-10-CM | POA: Insufficient documentation

## 2021-11-02 DIAGNOSIS — Z7982 Long term (current) use of aspirin: Secondary | ICD-10-CM | POA: Diagnosis not present

## 2021-11-02 DIAGNOSIS — M545 Low back pain, unspecified: Secondary | ICD-10-CM | POA: Diagnosis not present

## 2021-11-02 DIAGNOSIS — Z95820 Peripheral vascular angioplasty status with implants and grafts: Secondary | ICD-10-CM | POA: Diagnosis not present

## 2021-11-02 DIAGNOSIS — M25552 Pain in left hip: Secondary | ICD-10-CM | POA: Diagnosis not present

## 2021-11-02 DIAGNOSIS — Z87891 Personal history of nicotine dependence: Secondary | ICD-10-CM | POA: Insufficient documentation

## 2021-11-02 DIAGNOSIS — I10 Essential (primary) hypertension: Secondary | ICD-10-CM | POA: Diagnosis not present

## 2021-11-02 DIAGNOSIS — Z95828 Presence of other vascular implants and grafts: Secondary | ICD-10-CM | POA: Diagnosis not present

## 2021-11-02 HISTORY — PX: ABDOMINAL AORTOGRAM W/LOWER EXTREMITY: CATH118223

## 2021-11-02 LAB — POCT I-STAT, CHEM 8
BUN: 22 mg/dL (ref 8–23)
Calcium, Ion: 1.22 mmol/L (ref 1.15–1.40)
Chloride: 103 mmol/L (ref 98–111)
Creatinine, Ser: 1.8 mg/dL — ABNORMAL HIGH (ref 0.61–1.24)
Glucose, Bld: 91 mg/dL (ref 70–99)
HCT: 44 % (ref 39.0–52.0)
Hemoglobin: 15 g/dL (ref 13.0–17.0)
Potassium: 4.5 mmol/L (ref 3.5–5.1)
Sodium: 138 mmol/L (ref 135–145)
TCO2: 24 mmol/L (ref 22–32)

## 2021-11-02 SURGERY — ABDOMINAL AORTOGRAM W/LOWER EXTREMITY
Anesthesia: LOCAL | Laterality: Bilateral

## 2021-11-02 MED ORDER — MIDAZOLAM HCL 2 MG/2ML IJ SOLN
INTRAMUSCULAR | Status: AC
  Filled 2021-11-02: qty 2

## 2021-11-02 MED ORDER — FENTANYL CITRATE (PF) 100 MCG/2ML IJ SOLN
INTRAMUSCULAR | Status: AC
  Filled 2021-11-02: qty 2

## 2021-11-02 MED ORDER — LIDOCAINE HCL (PF) 1 % IJ SOLN
INTRAMUSCULAR | Status: DC | PRN
  Administered 2021-11-02: 10 mL via INTRADERMAL

## 2021-11-02 MED ORDER — SODIUM CHLORIDE 0.9% FLUSH: 3.0000 mL | INTRAVENOUS | Status: DC | PRN

## 2021-11-02 MED ORDER — ONDANSETRON HCL 4 MG/2ML IJ SOLN: 4.0000 mg | Freq: Four times a day (QID) | INTRAMUSCULAR | Status: DC | PRN

## 2021-11-02 MED ORDER — LIDOCAINE HCL (PF) 1 % IJ SOLN
INTRAMUSCULAR | Status: AC
  Filled 2021-11-02: qty 30

## 2021-11-02 MED ORDER — IODIXANOL 320 MG/ML IV SOLN
INTRAVENOUS | Status: DC | PRN
  Administered 2021-11-02: 30 mL

## 2021-11-02 MED ORDER — SODIUM CHLORIDE 0.9 % WEIGHT BASED INFUSION: 1.0000 mL/kg/h | INTRAVENOUS | Status: DC

## 2021-11-02 MED ORDER — HEPARIN (PORCINE) IN NACL 1000-0.9 UT/500ML-% IV SOLN
INTRAVENOUS | Status: AC
  Filled 2021-11-02: qty 1000

## 2021-11-02 MED ORDER — LABETALOL HCL 5 MG/ML IV SOLN: 10.0000 mg | INTRAVENOUS | Status: DC | PRN

## 2021-11-02 MED ORDER — FENTANYL CITRATE (PF) 100 MCG/2ML IJ SOLN
INTRAMUSCULAR | Status: DC | PRN
  Administered 2021-11-02: 50 ug via INTRAVENOUS

## 2021-11-02 MED ORDER — ACETAMINOPHEN 325 MG PO TABS: 650.0000 mg | ORAL_TABLET | ORAL | Status: DC | PRN

## 2021-11-02 MED ORDER — SODIUM CHLORIDE 0.9% FLUSH: 3.0000 mL | Freq: Two times a day (BID) | INTRAVENOUS | Status: DC

## 2021-11-02 MED ORDER — HEPARIN (PORCINE) IN NACL 1000-0.9 UT/500ML-% IV SOLN
INTRAVENOUS | Status: DC | PRN
  Administered 2021-11-02 (×2): 500 mL

## 2021-11-02 MED ORDER — MIDAZOLAM HCL 2 MG/2ML IJ SOLN
INTRAMUSCULAR | Status: DC | PRN
  Administered 2021-11-02: 1 mg via INTRAVENOUS

## 2021-11-02 MED ORDER — SODIUM CHLORIDE 0.9 % IV SOLN: INTRAVENOUS | Status: DC

## 2021-11-02 MED ORDER — SODIUM CHLORIDE 0.9 % IV SOLN: 250.0000 mL | INTRAVENOUS | Status: DC | PRN

## 2021-11-02 MED ORDER — MORPHINE SULFATE (PF) 2 MG/ML IV SOLN: 2.0000 mg | INTRAVENOUS | Status: DC | PRN

## 2021-11-02 SURGICAL SUPPLY — 11 items
CATH OMNI FLUSH 5F 65CM (CATHETERS) ×2 IMPLANT
KIT ANGIASSIST CO2 SYSTEM (KITS) ×2 IMPLANT
KIT MICROPUNCTURE NIT STIFF (SHEATH) ×2 IMPLANT
KIT PV (KITS) ×2 IMPLANT
SHEATH PINNACLE 5F 10CM (SHEATH) ×2 IMPLANT
SHEATH PROBE COVER 6X72 (BAG) ×2 IMPLANT
STOPCOCK MORSE 400PSI 3WAY (MISCELLANEOUS) ×2 IMPLANT
SYR MEDRAD MARK V 150ML (SYRINGE) ×2 IMPLANT
TRANSDUCER W/STOPCOCK (MISCELLANEOUS) ×2 IMPLANT
TRAY PV CATH (CUSTOM PROCEDURE TRAY) ×2 IMPLANT
WIRE BENTSON .035X145CM (WIRE) ×2 IMPLANT

## 2021-11-02 NOTE — H&P (Signed)
[facility] [place] HISTORY AND PHYSICAL    History of Present Illness:  Patient is a 70 y.o. year old male who presents for evaluation of peripheral artery disease. He most recently underwent atherectomy and stent placement of the right common iliac artery, external iliac artery and popliteal artery on 08/06/19 by Dr. Trula Slade for disabling claudication.     He has history of left femoral endarterectomy with vein patch angioplasty, atherectomy and stenting of left iliac and superficial femoral arteries on 01/29/15 by Dr. Trula Slade. Also has had prior right common femoral endarterectomy and femoral to above knee popliteal artery saphenous vein bypass graft on 12/10/12 by Dr. Trula Slade. Both of these were performed due to lifestyle limiting claudication               He was seen in the ED recently for lumbar/sever left buttock pain that limited his walking to just a few steps. pain and a  CTA was performed to r/o AAA showing no evidence of AAA, but he has Severe atherosclerotic calcifications involving both common iliac arteries.                He denise new rest pain in the B LE, non healing ulcers, or LE claudication.     The pt is on a statin for cholesterol management.  The pt is on a daily aspirin.   Other AC:  none The pt is on ARB, BB, Hydralazine for hypertension.   The pt is diabetic.  Tobacco hx:  Former, quit 2008       Past Medical History:  Diagnosis Date   Anxiety     Arthritis     CAD (coronary artery disease)      Mild plaque (cath "years ago"); abnormal Myoview 04/2013 with subsequent CABG x 5 with LIMA to LAD, SVG to OM1, SVG to DX, SVG to PD & PL.    Carotid artery disease ([place])      Carotid US 07/2019 bilateral ICA 40-59; bilateral subclavian stenosis // Carotid US 9/21: Bilateral ICA 40-59; L VA occluded; R subclavian stenosis   Cataract     GERD (gastroesophageal reflux disease)     History of colonic polyps     History of echocardiogram      Echo 2/19:  Mild LVH, EF 60-65, normal wall motion, grade 2 diastolic dysfunction, mild RAE   History of nuclear stress test      Myoview 2/19: inf/inf-lat/apical inf/ant-sept defect (?diaph atten - cannot rule out peri-infarct ischemia), PVCs/PACs/Mobitz 1 // Myoview 05/2019: EF 60, inf infarct with mild peri-infarct ischemia; no significant change when compared to prior study; Intermediate Risk   Hx of echocardiogram      Echo (9/15):  EF 55-60%; Gr 2 DD, mild BAE   Hyperlipidemia     Hypertension     LBP (low back pain)     Meralgia paresthetica of left side 2011   Polycythemia, secondary 02/03/2020   PVD (peripheral vascular disease) ([place])      Stent to left common femoral and right superficial femoral.  2008.  50%  left renal    Second degree AV block, Mobitz type I      Holter 2/19: Sinus rhythm, average heart rate 72, frequent PVCs (burden 5%), second-degree type I AV block and periods of 2:1 heart block >> continue clinical managment and avoid AVN blocking agents   Shortness of breath      "once in awhile; can happen at anytime" (08/26/2013)  Sleep apnea      mod OSA, central sleep apnea/hypoapnea syndrome 11/22/12, CPAP every night    Subclavian artery stenosis ([place])      Carotid US 07/2019 bilateral ICA 40-59; bilateral subclavian stenosis   Vitamin D deficiency             Past Surgical History:  Procedure Laterality Date   ABDOMINAL AORTAGRAM N/A 11/16/2011    Procedure: ABDOMINAL Maxcine Ham;  Surgeon: Sherren Mocha, MD;  Location: [facility] CATH LAB;  Service: Cardiovascular;  Laterality: N/A;   ABDOMINAL AORTAGRAM N/A 11/14/2012    Procedure: ABDOMINAL Maxcine Ham;  Surgeon: Sherren Mocha, MD;  Location: [facility] CATH LAB;  Service: Cardiovascular;  Laterality: N/A;   ABDOMINAL AORTAGRAM N/A 12/30/2014    Procedure: ABDOMINAL Maxcine Ham;  Surgeon: Serafina Mitchell, MD;  Location: [facility] CATH LAB;  Service: Cardiovascular;  Laterality: N/A;   ABDOMINAL AORTOGRAM W/LOWER EXTREMITY Bilateral 08/06/2019     Procedure: ABDOMINAL AORTOGRAM W/LOWER EXTREMITY;  Surgeon: Serafina Mitchell, MD;  Location: [place] CV LAB;  Service: Cardiovascular;  Laterality: Bilateral;   CARDIAC CATHETERIZATION   05/02/13    x2    CORONARY ARTERY BYPASS GRAFT N/A 05/06/2013    Procedure: CORONARY ARTERY BYPASS GRAFTING (CABG);  Surgeon: Melrose Nakayama, MD;  Location: [place];  Service: Open Heart Surgery;  Laterality: N/A;  Coronary artery bypass graft times five using left internal mammary artery and left greater saphenous vein via endovein harvest.   ENDARTERECTOMY FEMORAL Left 01/29/2015    Procedure: ENDARTERECTOMY FEMORAL WITH PATCH ANGIOPLASTY;  Surgeon: Serafina Mitchell, MD;  Location: [facility] OR;  Service: Vascular;  Laterality: Left;   EYE SURGERY   03/24/16    cataract surgery on left eye   FEMORAL-POPLITEAL BYPASS GRAFT   12/27/2012    Procedure: BYPASS GRAFT FEMORAL-POPLITEAL ARTERY;  Surgeon: Serafina Mitchell, MD;  Location: [facility] OR;  Service: Vascular;  Laterality: Right;  using non-reversed sapphenous vein.   ILIAC ATHERECTOMY Left 01/29/2015    Procedure: SUPERFICIAL FEMORAL ARTERY ATHERECTOMY/PERCUTANEOUS TRANSLUMINAL ANGIOPLASTY; superficial femoral artery stent;  Surgeon: Serafina Mitchell, MD;  Location: [place];  Service: Vascular;  Laterality: Left;   LOWER EXTREMITY ANGIOGRAM Bilateral 11/16/2011    Procedure: LOWER EXTREMITY ANGIOGRAM;  Surgeon: Sherren Mocha, MD;  Location: [facility] CATH LAB;  Service: Cardiovascular;  Laterality: Bilateral;   lower extremity stents        bilateral lower extremities x 2   PACEMAKER IMPLANT N/A 11/28/2019    Procedure: PACEMAKER IMPLANT;  Surgeon: Thompson Grayer, MD;  Location: [place] CV LAB;  Service: Cardiovascular;  Laterality: N/A;   PERCUTANEOUS STENT INTERVENTION Left 11/16/2011    Procedure: PERCUTANEOUS STENT INTERVENTION;  Surgeon: Sherren Mocha, MD;  Location: [facility] CATH LAB;  Service: Cardiovascular;  Laterality: Left;   PERIPHERAL VASCULAR ATHERECTOMY Right 08/06/2019     Procedure: PERIPHERAL VASCULAR ATHERECTOMY;  Surgeon: Serafina Mitchell, MD;  Location: [place] CV LAB;  Service: Cardiovascular;  Laterality: Right;  Common iliac, external iliac, and popliteal.   PERIPHERAL VASCULAR INTERVENTION Right 08/06/2019    Procedure: PERIPHERAL VASCULAR INTERVENTION;  Surgeon: Serafina Mitchell, MD;  Location: [place] CV LAB;  Service: Cardiovascular;  Laterality: Right;  common iliac, external iliac, and popliteal.   TONSILLECTOMY       TOTAL HIP ARTHROPLASTY Left 06/29/2015    Procedure: TOTAL HIP ARTHROPLASTY;  Surgeon: Frederik Pear, MD;  Location: [place];  Service: Orthopedics;  Laterality: Left;  LEFT TOTAL HIP ARTHROPLASTY DEPUY SROM/PINNACLE  ROS:    General:  No weight loss, Fever, chills   HEENT: No recent headaches, no nasal bleeding, no visual changes, no sore throat   Neurologic: No dizziness, blackouts, seizures. No recent symptoms of stroke or mini- stroke. No recent episodes of slurred speech, or temporary blindness.   Cardiac: No recent episodes of chest pain/pressure, no shortness of breath at rest.  No shortness of breath with exertion.  Denies history of atrial fibrillation or irregular heartbeat   Vascular: No history of rest pain in feet.  positive history of claudication.  No history of non-healing ulcer, No history of DVT    Pulmonary: No home oxygen, no productive cough, no hemoptysis,  No asthma or wheezing   Musculoskeletal:  [ ]  Arthritis, [ ]  Low back pain,  [ ]  Joint pain   Hematologic:No history of hypercoagulable state.  No history of easy bleeding.  No history of anemia   Gastrointestinal: No hematochezia or melena,  No gastroesophageal reflux, no trouble swallowing   Urinary: [ ]  chronic Kidney disease, [ ]  on HD - [ ]  MWF or [ ]  TTHS, [ ]  Burning with urination, [ ]  Frequent urination, [ ]  Difficulty urinating;    Skin: No rashes   Psychological: No history of anxiety,  No history of depression   Social  History Social History         Tobacco Use   Smoking status: Former      Packs/day: 2.00      Years: 47.00      Pack years: 94.00      Types: Cigarettes      Quit date: 12/17/2012      Years since quitting: 8.7   Smokeless tobacco: Never  Vaping Use   Vaping Use: Never used  Substance Use Topics   Alcohol use: Yes      Alcohol/week: 4.0 standard drinks      Types: 4 Shots of liquor per week      Comment: 08/26/2013 "3-4 mixed drinks/wk"   Drug use: No      Family History      Family History  Problem Relation Age of Onset   Heart disease Mother          No clear CAD and Heart Disease before age 19   Hypertension Mother     Cancer Mother          ? colon ca   Hyperlipidemia Mother     Cancer Father          brain tumor   Alzheimer's disease Father     Colon cancer Neg Hx     Heart attack Neg Hx     Esophageal cancer Neg Hx     Liver cancer Neg Hx     Rectal cancer Neg Hx     Stomach cancer Neg Hx     Pancreatic cancer Neg Hx        Allergies        Allergies  Allergen Reactions   Roxicodone [Oxycodone Hcl] Other (See Comments)      hallucinations   Hydralazine        Skin lupus type reaction   Benazepril Cough   Gabapentin Rash   Itraconazole Nausea Only and Rash   Lipitor [Atorvastatin Calcium]        cramps              Current Outpatient Medications  Medication Sig Dispense Refill   acetaminophen (TYLENOL) 650 MG CR  tablet Take 1,300 mg by mouth every 8 (eight) hours as needed for pain.       aspirin EC 81 MG tablet Take 81 mg by mouth daily. Swallow whole.       cholecalciferol (VITAMIN D) 1000 units tablet Take 1,000 Units by mouth daily.        clindamycin (CLEOCIN-T) 1 % lotion Apply topically 2 (two) times daily. 120 mL 2   clonazePAM (KLONOPIN) 0.5 MG tablet TAKE ONE TABLET TWICE DAILY AS NEEDED FOR ANXIETY 60 tablet 3   doxycycline (VIBRA-TABS) 100 MG tablet Take 1 tablet (100 mg total) by mouth 2 (two) times daily. 14 tablet 1   losartan  (COZAAR) 50 MG tablet Take 1 tablet (Total 50 mg) in the morning (AM) and 1/2 (25 mg) tablet in the evening (PM). 135 tablet 1   methylPREDNISolone (MEDROL DOSEPAK) 4 MG TBPK tablet As directed 21 tablet 0   metoprolol succinate (TOPROL-XL) 50 MG 24 hr tablet Take 1 tablet (50 mg total) by mouth daily. Take with or immediately following a meal. 90 tablet 3   potassium chloride (KLOR-CON) 8 MEQ tablet Take 1 tablet (8 mEq total) by mouth daily. 90 tablet 3   rosuvastatin (CRESTOR) 40 MG tablet Take 1 tablet (40 mg total) by mouth daily. 90 tablet 1   tadalafil (CIALIS) 20 MG tablet Take 1 tablet (20 mg total) by mouth every three (3) days as needed for erectile dysfunction. 30 tablet 3   traMADol (ULTRAM) 50 MG tablet Take 0.5 to 1 tablets (25-50 mg total) by mouth every 6 (six) hours as needed for severe pain 100 tablet 2   triamterene-hydrochlorothiazide (MAXZIDE-25) 37.5-25 MG tablet Take 2 tablets by mouth daily. Annual appt due in Nov must see provider for future refills 60 tablet 0   vitamin B-12 (CYANOCOBALAMIN) 1000 MCG tablet Take 1,000 mcg by mouth daily.       cyclobenzaprine (FLEXERIL) 10 MG tablet Take 1 tablet (10 mg total) by mouth 2 (two) times daily as needed for muscle spasms. (Patient not taking: Reported on 10/01/2021) 20 tablet 0    No current facility-administered medications for this visit.      Physical Examination      Vitals:    10/01/21 0930  BP: (!) 142/72  Pulse: 84  Resp: 20  Temp: 98.3 F (36.8 C)  TempSrc: Temporal  SpO2: 100%  Weight: 186 lb 14.4 oz (84.8 kg)  Height: 5\' 4"  (1.626 m)      Body mass index is 32.08 kg/m.   General:  Alert and oriented, no acute distress HEENT: Normal Neck: No bruit or JVD Pulmonary: Clear to auscultation bilaterally Cardiac: Regular Rate and Rhythm without murmur Abdomen: Soft, non-tender, non-distended, no mass, no scars Skin: No rash Extremity Pulses:  2+ radial, brachial, femoral, dorsalis pedis pulses  bilaterally Musculoskeletal: No deformity or edema      Neurologic: Upper and lower extremity motor 5/5 and symmetric   DATA:  +-------+-----------+-----------+------------+------------+  ABI/TBIToday's ABIToday's TBIPrevious ABIPrevious TBI  +-------+-----------+-----------+------------+------------+  Right  1.12       0.90       1.09        0.95          +-------+-----------+-----------+------------+------------+  Left   1.05       0.77       1.17        0.79          +-------+-----------+-----------+------------+------------+    CTA 09/26/21 IMPRESSION: 1. Severe/advanced atherosclerotic  calcifications involving the thoracic and abdominal aorta and branch vessels but no focal aneurysm or dissection. Severe stenosis at the origin of the left external iliac artery and occlusion of the left internal iliac artery. 2. Surgical changes from coronary artery bypass surgery. 3. No acute pulmonary findings. 4. No acute abdominal/pelvic findings, mass lesions or lymphadenopathy.   Aortic Atherosclerosis (ICD10-I70.0).     ASSESSMENT:  PAD with significant atherosclerosis in the Aorta and B iliac arterial He reported to the ED for sever left buttock pain with ambulation.  He needed help to get to the ED and was unable to walk more than 10-15 ft.   S/P Aortogram 08/05/21 with arthrectomy right CIA, right ext iliac artery followed by stenting. His ABI's are stable with palpable DP pulses B.     PLAN: I have scheduled him for aortogram with possible intervention.  The CTA performed states he has significant  stenosis in the left ext. Iliac and occluded left internal iliac.       Roxy Horseman PA-C [facility] [place] Office: [phone]   MD in clinic [place]  Patient still with left back, buttock and foot pain.    CT shows iliac stenosis.  I discussed that his arterial disease could be responsible for his symptoms.  We will proceed with  angiography.  If this does not improve his symptoms, he will need further evaluation of his back  Wells Hedi Barkan

## 2021-11-02 NOTE — Progress Notes (Signed)
Site area: right groin  Site Prior to Removal:  Level 0  Pressure Applied For 16 MINUTES    Minutes Beginning at 0911  Manual:   Yes.    Patient Status During Pull:  Stable  Post Pull Groin Site:  Level 0  Post Pull Instructions Given:  Yes.    Post Pull Pulses Present:  Yes.    Dressing Applied:  Yes.    Comments:Bed rest started at 0930

## 2021-11-02 NOTE — Op Note (Signed)
    Patient name: Douglas Christensen MRN: [id number] DOB: 09/26/1951 Sex: male  11/02/2021 Pre-operative Diagnosis: Left leg pain Post-operative diagnosis:  Same Surgeon:  [name] Procedure Performed:  1.  Ultrasound-guided access, left femoral artery  2.  Abdominal aortogram with CO2  3.  Bilateral lower extremity runoff  4.  Conscious sedation, 36 minutes    Indications: This is a 70 year old gentleman with multiple prior vascular interventions who has been having severe left back hip and foot pain.  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  He is here for further evaluation  Procedure:  The patient was identified in the holding area and taken to room 8.  The patient was then placed supine on the table and prepped and draped in the usual sterile fashion.  A time out was called.  Conscious sedation was administered with the use of IV fentanyl and Versed under continuous physician and nurse monitoring.  Heart rate, blood pressure, and oxygen saturation were continuously monitored.  Total sedation time was 36 minutes.  Ultrasound was used to evaluate the right common femoral artery.  It was patent .  A digital ultrasound image was acquired.  A micropuncture needle was used to access the right common femoral artery under ultrasound guidance.  An 018 wire was advanced without resistance and a micropuncture sheath was placed.  The 018 wire was removed and a benson wire was placed.  The micropuncture sheath was exchanged for a 5 french sheath.  An omniflush catheter was advanced over the wire to the level of L-1.  An abdominal angiogram with CO2 was obtained.  Next, the cath was pulled out of the aortic bifurcation and pelvic imaging was obtained followed additional CO2 injections to evaluate the leg. Findings:   Aortogram: No significant renal artery stenosis was identified.  The infrarenal abdominal aorta is heavily calcified without significant stenosis.  Stents within the right common and  external iliac artery are widely patent.  The stent within the left common and external iliac artery are widely patent.  The left hypogastric artery is occluded.  Right Lower Extremity: There is patulous dilatation of the right common femoral artery consistent with prior endarterectomy with patch angioplasty.  Bypass graft is visualized off of the common femoral artery down to the popliteal artery.  No obvious stenosis was identified.  Left Lower Extremity: The left common femoral artery is widely patent.  It is consistent with prior patch angioplasty.  Stents within the left superficial femoral artery are patent without hemodynamically significant stenosis however there is some luminal narrowing within the midportion of the superficial femoral artery.  The popliteal artery is widely patent.  Runoff was difficult to visualize secondary to timing of contrast however he does have 3 vessels proximally and the posterior tibial was visualized crossing the ankle.  Intervention: None  Impression:  #1  The iliac artery of concern on the left visualized on CT scan does not show any significant stenosis.  #2  Patent right femoral endarterectomy and femoral-popliteal bypass graft  #3  Patent left femoral endarterectomy without stenosis.  There is mild narrowing within the proximal to midportion of the left superficial femoral artery stents   V. [name], M.D., [facility] [place] Office: [phone] Pager:  [phone]

## 2021-11-02 NOTE — Progress Notes (Signed)
Pt ambulated without difficulty or bleeding.   Discharged home with daughter who will drive and stay with pt x 24 hrs 

## 2021-11-03 ENCOUNTER — Encounter (HOSPITAL_COMMUNITY): Payer: Self-pay | Admitting: Surgery

## 2021-11-10 ENCOUNTER — Other Ambulatory Visit: Payer: Self-pay | Admitting: Physician Assistant

## 2021-12-01 ENCOUNTER — Other Ambulatory Visit: Payer: Self-pay

## 2021-12-01 ENCOUNTER — Ambulatory Visit (INDEPENDENT_AMBULATORY_CARE_PROVIDER_SITE_OTHER): Payer: Medicare HMO | Admitting: Internal Medicine

## 2021-12-01 ENCOUNTER — Encounter: Payer: Self-pay | Admitting: Internal Medicine

## 2021-12-01 DIAGNOSIS — I2510 Atherosclerotic heart disease of native coronary artery without angina pectoris: Secondary | ICD-10-CM

## 2021-12-01 DIAGNOSIS — R69 Illness, unspecified: Secondary | ICD-10-CM | POA: Diagnosis not present

## 2021-12-01 DIAGNOSIS — I129 Hypertensive chronic kidney disease with stage 1 through stage 4 chronic kidney disease, or unspecified chronic kidney disease: Secondary | ICD-10-CM | POA: Diagnosis not present

## 2021-12-01 DIAGNOSIS — G8929 Other chronic pain: Secondary | ICD-10-CM

## 2021-12-01 DIAGNOSIS — F419 Anxiety disorder, unspecified: Secondary | ICD-10-CM

## 2021-12-01 DIAGNOSIS — E538 Deficiency of other specified B group vitamins: Secondary | ICD-10-CM

## 2021-12-01 DIAGNOSIS — M5440 Lumbago with sciatica, unspecified side: Secondary | ICD-10-CM | POA: Diagnosis not present

## 2021-12-01 MED ORDER — TRAMADOL HCL 50 MG PO TABS: 50.0000 mg | ORAL_TABLET | Freq: Four times a day (QID) | ORAL | 5 refills | Status: DC | PRN

## 2021-12-01 MED ORDER — ALPROSTADIL (VASODILATOR) 20 MCG IC KIT: 20.0000 ug | PACK | INTRACAVERNOUS | 5 refills | Status: DC | PRN

## 2021-12-01 NOTE — Assessment & Plan Note (Signed)
Clonazepam - rare use prn  Potential benefits of a long term benzodiazepines  use as well as potential risks  and complications were explained to the patient and were aknowledged.

## 2021-12-01 NOTE — Progress Notes (Signed)
Subjective:  Patient ID: [name], male    DOB: 1951-01-09  Age: 71 y.o. MRN: [id number]  CC: Back Pain   HPI MCIHAEL HINDERMAN presents for CAD, PVD, COPD  C/o LBP worse w/walking - no seriuos blockage  Moving to [facility]  Outpatient Medications Prior to Visit  Medication Sig Dispense Refill   acetaminophen (TYLENOL) 650 MG CR tablet Take 1,300 mg by mouth every 8 (eight) hours as needed for pain.     Ascorbic Acid (VITAMIN C PO) Take 1 tablet by mouth in the morning.     aspirin EC 81 MG tablet Take 81 mg by mouth every other day. Swallow whole.     cholecalciferol (VITAMIN D) 1000 units tablet Take 1,000 Units by mouth daily.      clindamycin (CLEOCIN-T) 1 % lotion Apply topically 2 (two) times daily. (Patient taking differently: Apply 1 application topically daily as needed (skin blemishes/acne).) 120 mL 2   clonazePAM (KLONOPIN) 0.5 MG tablet TAKE ONE TABLET TWICE DAILY AS NEEDED FOR ANXIETY 60 tablet 3   cyclobenzaprine (FLEXERIL) 10 MG tablet Take 1 tablet (10 mg total) by mouth 2 (two) times daily as needed for muscle spasms. 20 tablet 0   Lidocaine 4 % PTCH Place 1 patch onto the skin daily as needed (back pain.).     losartan (COZAAR) 50 MG tablet Take 1 tablet (Total 50 mg) in the morning (AM) and 1/2 (25 mg) tablet in the evening (PM). 135 tablet 1   metoprolol succinate (TOPROL-XL) 50 MG 24 hr tablet Take 1 tablet (50 mg total) by mouth daily. Take with or immediately following a meal. 30 tablet 0   rosuvastatin (CRESTOR) 40 MG tablet Take 1 tablet (40 mg total) by mouth daily. (Patient taking differently: Take 40 mg by mouth 3 (three) times a week.) 90 tablet 1   tadalafil (CIALIS) 20 MG tablet Take 1 tablet (20 mg total) by mouth every three (3) days as needed for erectile dysfunction. 30 tablet 3   triamterene-hydrochlorothiazide (MAXZIDE-25) 37.5-25 MG tablet Take 2 tablets by mouth daily. Annual appt due in Nov must see provider for future refills (Patient taking  differently: Take 1 tablet by mouth daily. Annual appt due in Nov must see provider for future refills) 60 tablet 0   vitamin B-12 (CYANOCOBALAMIN) 1000 MCG tablet Take 1,000 mcg by mouth daily.     traMADol (ULTRAM) 50 MG tablet Take 0.5 to 1 tablets (25-50 mg total) by mouth every 6 (six) hours as needed for severe pain 100 tablet 2   No facility-administered medications prior to visit.    ROS: Review of Systems  Constitutional:  Negative for appetite change, fatigue and unexpected weight change.  HENT:  Negative for congestion, nosebleeds, sneezing, sore throat and trouble swallowing.   Eyes:  Negative for itching and visual disturbance.  Respiratory:  Negative for cough.   Cardiovascular:  Negative for chest pain, palpitations and leg swelling.  Gastrointestinal:  Negative for abdominal distention, blood in stool, diarrhea and nausea.  Genitourinary:  Negative for frequency and hematuria.  Musculoskeletal:  Positive for arthralgias, back pain and gait problem. Negative for joint swelling and neck pain.  Skin:  Negative for rash.  Neurological:  Negative for dizziness, tremors, speech difficulty and weakness.  Psychiatric/Behavioral:  Positive for dysphoric mood. Negative for agitation and sleep disturbance. The patient is nervous/anxious.    Objective:  BP (!) 164/60 (BP Location: Left Arm)    Pulse (!) 57  Temp 98.3 F (36.8 C) (Oral)    Ht 5\' 4"  (1.626 m)    Wt 191 lb 12.8 oz (87 kg)    SpO2 97%    BMI 32.92 kg/m   BP Readings from Last 3 Encounters:  12/01/21 (!) 164/60  11/02/21 120/61  10/01/21 (!) 142/72    Wt Readings from Last 3 Encounters:  12/01/21 191 lb 12.8 oz (87 kg)  11/02/21 180 lb (81.6 kg)  10/01/21 186 lb 14.4 oz (84.8 kg)    Physical Exam Constitutional:      General: He is not in acute distress.    Appearance: He is well-developed. He is obese.     Comments: NAD  Eyes:     Conjunctiva/sclera: Conjunctivae normal.     Pupils: Pupils are equal,  round, and reactive to light.  Neck:     Thyroid: No thyromegaly.     Vascular: No JVD.  Cardiovascular:     Rate and Rhythm: Normal rate and regular rhythm.     Heart sounds: Normal heart sounds. No murmur heard.   No friction rub. No gallop.  Pulmonary:     Effort: Pulmonary effort is normal. No respiratory distress.     Breath sounds: Normal breath sounds. No wheezing or rales.  Chest:     Chest wall: No tenderness.  Abdominal:     General: Bowel sounds are normal. There is no distension.     Palpations: Abdomen is soft. There is no mass.     Tenderness: There is no abdominal tenderness. There is no guarding or rebound.  Musculoskeletal:        General: Tenderness present. Normal range of motion.     Cervical back: Normal range of motion.  Lymphadenopathy:     Cervical: No cervical adenopathy.  Skin:    General: Skin is warm and dry.     Findings: No rash.  Neurological:     Mental Status: He is alert and oriented to person, place, and time.     Cranial Nerves: No cranial nerve deficit.     Motor: No abnormal muscle tone.     Coordination: Coordination normal.     Gait: Gait normal.     Deep Tendon Reflexes: Reflexes are normal and symmetric.  Psychiatric:        Behavior: Behavior normal.        Thought Content: Thought content normal.        Judgment: Judgment normal.    Lab Results  Component Value Date   WBC 13.6 (H) 09/26/2021   HGB 15.0 11/02/2021   HCT 44.0 11/02/2021   PLT 252 09/26/2021   GLUCOSE 91 11/02/2021   CHOL 132 10/05/2020   TRIG 158.0 (H) 10/05/2020   HDL 53.70 10/05/2020   LDLDIRECT 148.0 06/07/2018   LDLCALC 46 10/05/2020   ALT 54 (H) 09/26/2021   AST 50 (H) 09/26/2021   NA 138 11/02/2021   K 4.5 11/02/2021   CL 103 11/02/2021   CREATININE 1.80 (H) 11/02/2021   BUN 22 11/02/2021   CO2 21 (L) 09/26/2021   TSH 2.62 10/05/2020   PSA 0.86 10/05/2020   INR 1.10 06/22/2015   HGBA1C 5.7 03/06/2017    PERIPHERAL VASCULAR  CATHETERIZATION  Result Date: 11/02/2021 Patient name: NAZARIO RUSSOM MRN: [id number] DOB: 1951/10/10 Sex: male 11/02/2021 Pre-operative Diagnosis: Left leg pain Post-operative diagnosis:  Same Surgeon:  [name] Procedure Performed:  1.  Ultrasound-guided access, left femoral artery  2.  Abdominal aortogram with CO2  3.  Bilateral lower extremity runoff  4.  Conscious sedation, 36 minutes Indications: This is a 71 year old gentleman with multiple prior vascular interventions who has been having severe left back hip and foot pain.  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  He is here for further evaluation Procedure:  The patient was identified in the holding area and taken to room 8.  The patient was then placed supine on the table and prepped and draped in the usual sterile fashion.  A time out was called.  Conscious sedation was administered with the use of IV fentanyl and Versed under continuous physician and nurse monitoring.  Heart rate, blood pressure, and oxygen saturation were continuously monitored.  Total sedation time was 36 minutes.  Ultrasound was used to evaluate the right common femoral artery.  It was patent .  A digital ultrasound image was acquired.  A micropuncture needle was used to access the right common femoral artery under ultrasound guidance.  An 018 wire was advanced without resistance and a micropuncture sheath was placed.  The 018 wire was removed and a benson wire was placed.  The micropuncture sheath was exchanged for a 5 french sheath.  An omniflush catheter was advanced over the wire to the level of L-1.  An abdominal angiogram with CO2 was obtained.  Next, the cath was pulled out of the aortic bifurcation and pelvic imaging was obtained followed additional CO2 injections to evaluate the leg. Findings:  Aortogram: No significant renal artery stenosis was identified.  The infrarenal abdominal aorta is heavily calcified without significant stenosis.  Stents within the right  common and external iliac artery are widely patent.  The stent within the left common and external iliac artery are widely patent.  The left hypogastric artery is occluded.  Right Lower Extremity: There is patulous dilatation of the right common femoral artery consistent with prior endarterectomy with patch angioplasty.  Bypass graft is visualized off of the common femoral artery down to the popliteal artery.  No obvious stenosis was identified.  Left Lower Extremity: The left common femoral artery is widely patent.  It is consistent with prior patch angioplasty.  Stents within the left superficial femoral artery are patent without hemodynamically significant stenosis however there is some luminal narrowing within the midportion of the superficial femoral artery.  The popliteal artery is widely patent.  Runoff was difficult to visualize secondary to timing of contrast however he does have 3 vessels proximally and the posterior tibial was visualized crossing the ankle. Intervention: None Impression:  #1  The iliac artery of concern on the left visualized on CT scan does not show any significant stenosis.  #2  Patent right femoral endarterectomy and femoral-popliteal bypass graft  #3  Patent left femoral endarterectomy without stenosis.  There is mild narrowing within the proximal to midportion of the left superficial femoral artery stents V. [name], M.D., [facility] [place] Office: [phone] Pager:  [phone]    Assessment & Plan:   Problem List Items Addressed This Visit     Anxiety disorder    Clonazepam - rare use prn  Potential benefits of a long term benzodiazepines  use as well as potential risks  and complications were explained to the patient and were aknowledged.      B12 deficiency    On B12      CAD (coronary artery disease)    No CP      Relevant Medications   alprostadil (  EDEX) 20 MCG injection   Hypertension, renal disease, stage 1-4 or  unspecified chronic kidney disease    Hydrate well Check GFR      Relevant Orders   Comprehensive metabolic panel   CBC with Differential/Platelet   Iron, TIBC and Ferritin Panel   TSH   CK   Urinalysis   LOW BACK PAIN    Worse Tramadol - up to 4/day Try TENS unit Sauna, steam room Massage chair, heat      Relevant Medications   traMADol (ULTRAM) 50 MG tablet   Other Relevant Orders   Comprehensive metabolic panel   CBC with Differential/Platelet   Iron, TIBC and Ferritin Panel   TSH   CK   Urinalysis      Meds ordered this encounter  Medications   alprostadil (EDEX) 20 MCG injection    Sig: 20 mcg by Intracavitary route as needed for erectile dysfunction. use no more than 3 times per week    Dispense:  4 each    Refill:  5   traMADol (ULTRAM) 50 MG tablet    Sig: Take 1 tablet (50 mg total) by mouth every 6 (six) hours as needed.    Dispense:  120 tablet    Refill:  5      Follow-up: No follow-ups on file.  [name], MD

## 2021-12-01 NOTE — Patient Instructions (Addendum)
Tramadol - up to 4/day Try TENS unit Sauna, steam room Massage chair, heat

## 2021-12-01 NOTE — Assessment & Plan Note (Addendum)
Worse Tramadol - up to 4/day Try TENS unit Sauna, steam room Massage chair, heat

## 2021-12-01 NOTE — Assessment & Plan Note (Signed)
On B12 

## 2021-12-01 NOTE — Assessment & Plan Note (Signed)
No CP 

## 2021-12-01 NOTE — Assessment & Plan Note (Signed)
Hydrate well Check GFR 

## 2021-12-02 DIAGNOSIS — I129 Hypertensive chronic kidney disease with stage 1 through stage 4 chronic kidney disease, or unspecified chronic kidney disease: Secondary | ICD-10-CM | POA: Diagnosis not present

## 2021-12-02 DIAGNOSIS — G8929 Other chronic pain: Secondary | ICD-10-CM | POA: Diagnosis not present

## 2021-12-02 DIAGNOSIS — M5440 Lumbago with sciatica, unspecified side: Secondary | ICD-10-CM | POA: Diagnosis not present

## 2021-12-02 LAB — COMPREHENSIVE METABOLIC PANEL
ALT: 27 U/L (ref 0–53)
AST: 33 U/L (ref 0–37)
Albumin: 4.1 g/dL (ref 3.5–5.2)
Alkaline Phosphatase: 50 U/L (ref 39–117)
BUN: 34 mg/dL — ABNORMAL HIGH (ref 6–23)
CO2: 21 mEq/L (ref 19–32)
Calcium: 9.4 mg/dL (ref 8.4–10.5)
Chloride: 105 mEq/L (ref 96–112)
Creatinine, Ser: 1.46 mg/dL (ref 0.40–1.50)
GFR: 48.34 mL/min — ABNORMAL LOW (ref >60.00)
Glucose, Bld: 86 mg/dL (ref 70–99)
Potassium: 3.7 mEq/L (ref 3.5–5.1)
Sodium: 137 mEq/L (ref 135–145)
Total Bilirubin: 0.4 mg/dL (ref 0.2–1.2)
Total Protein: 7.2 g/dL (ref 6.0–8.3)

## 2021-12-02 LAB — CBC WITH DIFFERENTIAL/PLATELET
Basophils Absolute: 0.1 10*3/uL (ref 0.0–0.1)
Basophils Relative: 1.1 % (ref 0.0–3.0)
Eosinophils Absolute: 0.4 10*3/uL (ref 0.0–0.7)
Eosinophils Relative: 5.4 % — ABNORMAL HIGH (ref 0.0–5.0)
HCT: 41.9 % (ref 39.0–52.0)
Hemoglobin: 14.4 g/dL (ref 13.0–17.0)
Lymphocytes Relative: 29.9 % (ref 12.0–46.0)
Lymphs Abs: 2 10*3/uL (ref 0.7–4.0)
MCHC: 34.3 g/dL (ref 30.0–36.0)
MCV: 100.7 fl — ABNORMAL HIGH (ref 78.0–100.0)
Monocytes Absolute: 0.6 10*3/uL (ref 0.1–1.0)
Monocytes Relative: 8.3 % (ref 3.0–12.0)
Neutro Abs: 3.7 10*3/uL (ref 1.4–7.7)
Neutrophils Relative %: 55.3 % (ref 43.0–77.0)
Platelets: 200 10*3/uL (ref 150.0–400.0)
RBC: 4.16 Mil/uL — ABNORMAL LOW (ref 4.22–5.81)
RDW: 13.3 % (ref 11.5–15.5)
WBC: 6.6 10*3/uL (ref 4.0–10.5)

## 2021-12-02 LAB — URINALYSIS
Bilirubin Urine: NEGATIVE
Hgb urine dipstick: NEGATIVE
Ketones, ur: NEGATIVE
Leukocytes,Ua: NEGATIVE
Nitrite: NEGATIVE
Specific Gravity, Urine: 1.015 (ref 1.000–1.030)
Total Protein, Urine: NEGATIVE
Urine Glucose: NEGATIVE
Urobilinogen, UA: 0.2 (ref 0.0–1.0)
pH: 5.5 (ref 5.0–8.0)

## 2021-12-02 LAB — CK: Total CK: 42 U/L (ref 7–232)

## 2021-12-02 LAB — TSH: TSH: 1.61 u[IU]/mL (ref 0.35–5.50)

## 2021-12-03 LAB — IRON,TIBC AND FERRITIN PANEL
%SAT: 25 % (calc) (ref 20–48)
Ferritin: 142 ng/mL (ref 24–380)
Iron: 80 ug/dL (ref 50–180)
TIBC: 326 mcg/dL (calc) (ref 250–425)

## 2021-12-10 ENCOUNTER — Telehealth: Payer: Self-pay | Admitting: *Deleted

## 2021-12-10 DIAGNOSIS — N529 Male erectile dysfunction, unspecified: Secondary | ICD-10-CM

## 2021-12-10 NOTE — Telephone Encounter (Signed)
Rec'd fax needing PA for Edex 60mg Cartridge-2-pk kit. Completed via cover-my-meds w/ Key:: [id number] Rec'd msg stating " The medication you have requested is not covered by Medicare Part D Law. If you believe the medication is being used for a medically accepted or compendia supported indication approved by CMS, please contact your patient's plan." There I no alternatives that was given..Marland KitchenJohny Chess

## 2021-12-12 NOTE — Telephone Encounter (Signed)
Please inform the patient of the denial.  We can have him to see [facility] urologist to see if there are other options available to help him.  Thanks

## 2021-12-14 NOTE — Telephone Encounter (Signed)
Called pt there was no answer LMOM w/MD response.Marland KitchenChryl Heck

## 2021-12-14 NOTE — Telephone Encounter (Signed)
Pt states if he needs to see a urologist for other options he is willing to do so at this time.   CB [phone]

## 2021-12-15 NOTE — Telephone Encounter (Signed)
Would it be in [place]?

## 2021-12-15 NOTE — Telephone Encounter (Addendum)
Pt states that he can come back to [employer] and have it done.   Pt is also requesting a Rx for B-12 injection. Pt states that he can get his pharmacy to mail them to him and a nurse that he know can give him his monthly injection.  CB [phone]

## 2021-12-18 NOTE — Addendum Note (Signed)
Addended by: Cassandria Anger on: 12/18/2021 09:39 PM   Modules accepted: Orders

## 2021-12-18 NOTE — Telephone Encounter (Signed)
Douglas Christensen's vitamin B12 lately was normal -okay to continue the tablets.   I will make a referral.  Thanks

## 2021-12-27 ENCOUNTER — Other Ambulatory Visit: Payer: Medicare HMO

## 2021-12-27 ENCOUNTER — Ambulatory Visit: Payer: Medicare HMO | Admitting: Family

## 2021-12-28 ENCOUNTER — Ambulatory Visit (INDEPENDENT_AMBULATORY_CARE_PROVIDER_SITE_OTHER): Payer: Medicare HMO

## 2021-12-28 DIAGNOSIS — I442 Atrioventricular block, complete: Secondary | ICD-10-CM | POA: Diagnosis not present

## 2022-01-04 LAB — CUP PACEART REMOTE DEVICE CHECK
Battery Remaining Longevity: 118 mo
Battery Voltage: 3.01 V
Brady Statistic AP VP Percent: 45.13 %
Brady Statistic AP VS Percent: 0.01 %
Brady Statistic AS VP Percent: 42.14 %
Brady Statistic AS VS Percent: 12.73 %
Brady Statistic RA Percent Paced: 49.92 %
Brady Statistic RV Percent Paced: 87.27 %
Date Time Interrogation Session: 20230206090249
Implantable Lead Implant Date: 20201231
Implantable Lead Implant Date: 20201231
Implantable Lead Location: 753859
Implantable Lead Location: 753860
Implantable Lead Model: 3830
Implantable Lead Model: 5076
Implantable Pulse Generator Implant Date: 20201231
Lead Channel Impedance Value: 380 Ohm
Lead Channel Impedance Value: 437 Ohm
Lead Channel Impedance Value: 475 Ohm
Lead Channel Impedance Value: 570 Ohm
Lead Channel Pacing Threshold Amplitude: 0.5 V
Lead Channel Pacing Threshold Amplitude: 0.625 V
Lead Channel Pacing Threshold Pulse Width: 0.4 ms
Lead Channel Pacing Threshold Pulse Width: 0.4 ms
Lead Channel Sensing Intrinsic Amplitude: 5.75 mV
Lead Channel Sensing Intrinsic Amplitude: 5.75 mV
Lead Channel Sensing Intrinsic Amplitude: 9.75 mV
Lead Channel Sensing Intrinsic Amplitude: 9.75 mV
Lead Channel Setting Pacing Amplitude: 1.5 V
Lead Channel Setting Pacing Amplitude: 2.5 V
Lead Channel Setting Pacing Pulse Width: 0.4 ms
Lead Channel Setting Sensing Sensitivity: 1.2 mV

## 2022-01-05 NOTE — Progress Notes (Signed)
Remote pacemaker transmission.   

## 2022-01-24 ENCOUNTER — Other Ambulatory Visit: Payer: Self-pay | Admitting: Cardiovascular Disease

## 2022-02-05 ENCOUNTER — Other Ambulatory Visit: Payer: Self-pay | Admitting: Cardiovascular Disease

## 2022-03-02 ENCOUNTER — Encounter: Payer: Self-pay | Admitting: Internal Medicine

## 2022-03-02 ENCOUNTER — Ambulatory Visit (INDEPENDENT_AMBULATORY_CARE_PROVIDER_SITE_OTHER): Payer: Medicare HMO | Admitting: Internal Medicine

## 2022-03-02 ENCOUNTER — Encounter: Payer: Self-pay | Admitting: Family

## 2022-03-02 DIAGNOSIS — J0100 Acute maxillary sinusitis, unspecified: Secondary | ICD-10-CM

## 2022-03-02 DIAGNOSIS — G8929 Other chronic pain: Secondary | ICD-10-CM

## 2022-03-02 DIAGNOSIS — J441 Chronic obstructive pulmonary disease with (acute) exacerbation: Secondary | ICD-10-CM | POA: Diagnosis not present

## 2022-03-02 DIAGNOSIS — M545 Low back pain, unspecified: Secondary | ICD-10-CM | POA: Diagnosis not present

## 2022-03-02 DIAGNOSIS — J019 Acute sinusitis, unspecified: Secondary | ICD-10-CM | POA: Insufficient documentation

## 2022-03-02 MED ORDER — CEFDINIR 300 MG PO CAPS: 300.0000 mg | ORAL_CAPSULE | Freq: Two times a day (BID) | ORAL | 0 refills | Status: DC

## 2022-03-02 MED ORDER — CYCLOBENZAPRINE HCL 10 MG PO TABS: 10.0000 mg | ORAL_TABLET | Freq: Three times a day (TID) | ORAL | 1 refills | Status: DC | PRN

## 2022-03-02 NOTE — Assessment & Plan Note (Signed)
Blue-Emu cream was recommended to use 2-3 times a day ? ?

## 2022-03-02 NOTE — Progress Notes (Signed)
? ?Subjective:  ?Patient ID: Douglas Christensen, male    DOB: [birth year]  Age: 71 y.o. MRN: [id number] ? ?CC: No chief complaint on file. ? ? ?HPI ?Douglas Christensen presents for URI  ?C/o ST, achy, coughing, fever x 2 d ? ?Outpatient Medications Prior to Visit  ?Medication Sig Dispense Refill  ? acetaminophen (TYLENOL) 650 MG CR tablet Take 1,300 mg by mouth every 8 (eight) hours as needed for pain.    ? Ascorbic Acid (VITAMIN C PO) Take 1 tablet by mouth in the morning.    ? aspirin EC 81 MG tablet Take 81 mg by mouth every other day. Swallow whole.    ? cholecalciferol (VITAMIN D) 1000 units tablet Take 1,000 Units by mouth daily.     ? FLUZONE HIGH-DOSE QUADRIVALENT 0.7 ML SUSY     ? losartan (COZAAR) 50 MG tablet Take 1 tablet (Total 50 mg) in the morning (AM) and 1/2 (25 mg) tablet in the evening (PM). Please make overdue appt with Dr. Burt Christensen. Thank you 1st attempt 45 tablet 0  ? tadalafil (CIALIS) 20 MG tablet Take 1 tablet (20 mg total) by mouth every three (3) days as needed for erectile dysfunction. 30 tablet 3  ? traMADol (ULTRAM) 50 MG tablet Take 1 tablet (50 mg total) by mouth every 6 (six) hours as needed. 120 tablet 5  ? triamterene-hydrochlorothiazide (MAXZIDE-25) 37.5-25 MG tablet Take 2 tablets by mouth daily. Annual appt due in Nov must see provider for future refills (Patient taking differently: Take 1 tablet by mouth daily. Annual appt due in Nov must see provider for future refills) 60 tablet 0  ? vitamin B-12 (CYANOCOBALAMIN) 1000 MCG tablet Take 1,000 mcg by mouth daily.    ? alprostadil (EDEX) 20 MCG injection 20 mcg by Intracavitary route as needed for erectile dysfunction. use no more than 3 times per week 4 each 5  ? clindamycin (CLEOCIN-T) 1 % lotion Apply topically 2 (two) times daily. (Patient taking differently: Apply 1 application. topically daily as needed (skin blemishes/acne).) 120 mL 2  ? clonazePAM (KLONOPIN) 0.5 MG tablet TAKE ONE TABLET TWICE DAILY AS NEEDED FOR ANXIETY 60 tablet 3  ?  cyclobenzaprine (FLEXERIL) 10 MG tablet Take 1 tablet (10 mg total) by mouth 2 (two) times daily as needed for muscle spasms. 20 tablet 0  ? metoprolol succinate (TOPROL-XL) 50 MG 24 hr tablet Take 1 tablet (50 mg total) by mouth daily. Take with or immediately following a meal. 30 tablet 0  ? Lidocaine 4 % PTCH Place 1 patch onto the skin daily as needed (back pain.). (Patient not taking: Reported on 03/02/2022)    ? rosuvastatin (CRESTOR) 40 MG tablet Take 1 tablet (40 mg total) by mouth 3 (three) times a week for 28 days. Patient needs appointment for future refills. Please call office to schedule appointment @ [phone]. 1st attempt. 12 tablet 0  ? ?No facility-administered medications prior to visit.  ? ? ?ROS: ?Review of Systems  ?Constitutional:  Positive for fatigue. Negative for appetite change and unexpected weight change.  ?HENT:  Positive for congestion, sinus pressure and sinus pain. Negative for nosebleeds, sneezing, sore throat and trouble swallowing.   ?Eyes:  Negative for itching and visual disturbance.  ?Respiratory:  Positive for cough and wheezing.   ?Cardiovascular:  Negative for chest pain, palpitations and leg swelling.  ?Gastrointestinal:  Negative for abdominal distention, blood in stool, diarrhea and nausea.  ?Genitourinary:  Negative for frequency and hematuria.  ?Musculoskeletal:  Positive  for arthralgias and back pain. Negative for gait problem, joint swelling and neck pain.  ?Skin:  Negative for rash.  ?Neurological:  Negative for dizziness, tremors, speech difficulty and weakness.  ?Psychiatric/Behavioral:  Negative for agitation, dysphoric mood and sleep disturbance. The patient is not nervous/anxious.   ? ?Objective:  ?BP 140/62 (BP Location: Left Arm, Patient Position: Sitting, Cuff Size: Large)   Pulse 73   Temp 98.3 ?F (36.8 ?C) (Oral)   Ht '5\' 4"'$  (1.626 m)   Wt 191 lb (86.6 kg)   SpO2 94%   BMI 32.79 kg/m?  ? ?BP Readings from Last 3 Encounters:  ?03/02/22 140/62  ?12/01/21  (!) 164/60  ?11/02/21 120/61  ? ? ?Wt Readings from Last 3 Encounters:  ?03/02/22 191 lb (86.6 kg)  ?12/01/21 191 lb 12.8 oz (87 kg)  ?11/02/21 180 lb (81.6 kg)  ? ? ?Physical Exam ?Constitutional:   ?   General: He is not in acute distress. ?   Appearance: He is well-developed. He is obese.  ?   Comments: NAD  ?Eyes:  ?   Conjunctiva/sclera: Conjunctivae normal.  ?   Pupils: Pupils are equal, round, and reactive to light.  ?Neck:  ?   Thyroid: No thyromegaly.  ?   Vascular: No JVD.  ?Cardiovascular:  ?   Rate and Rhythm: Normal rate and regular rhythm.  ?   Heart sounds: Normal heart sounds. No murmur heard. ?  No friction rub. No gallop.  ?Pulmonary:  ?   Effort: Pulmonary effort is normal. No respiratory distress.  ?   Breath sounds: Wheezing present. No rales.  ?Chest:  ?   Chest wall: No tenderness.  ?Abdominal:  ?   General: Bowel sounds are normal. There is no distension.  ?   Palpations: Abdomen is soft. There is no mass.  ?   Tenderness: There is no abdominal tenderness. There is no guarding or rebound.  ?Musculoskeletal:     ?   General: No tenderness. Normal range of motion.  ?   Cervical back: Normal range of motion.  ?Lymphadenopathy:  ?   Cervical: No cervical adenopathy.  ?Skin: ?   General: Skin is warm and dry.  ?   Findings: No rash.  ?Neurological:  ?   Mental Status: He is alert and oriented to person, place, and time.  ?   Cranial Nerves: No cranial nerve deficit.  ?   Motor: No abnormal muscle tone.  ?   Coordination: Coordination normal.  ?   Gait: Gait abnormal.  ?   Deep Tendon Reflexes: Reflexes are normal and symmetric.  ?Psychiatric:     ?   Behavior: Behavior normal.     ?   Thought Content: Thought content normal.     ?   Judgment: Judgment normal.  ?Antalgic gait ?Lumbar spine with pain ? ? ?POC COVID (-) test ? ?Lab Results  ?Component Value Date  ? WBC 6.6 12/02/2021  ? HGB 14.4 12/02/2021  ? HCT 41.9 12/02/2021  ? PLT 200.0 12/02/2021  ? GLUCOSE 86 12/02/2021  ? CHOL 132 10/05/2020   ? TRIG 158.0 (H) 10/05/2020  ? HDL 53.70 10/05/2020  ? LDLDIRECT 148.0 06/07/2018  ? [place] 46 10/05/2020  ? ALT 27 12/02/2021  ? AST 33 12/02/2021  ? NA 137 12/02/2021  ? K 3.7 12/02/2021  ? CL 105 12/02/2021  ? CREATININE 1.46 12/02/2021  ? BUN 34 (H) 12/02/2021  ? CO2 21 12/02/2021  ? TSH 1.61 12/02/2021  ?  PSA 0.86 10/05/2020  ? INR 1.10 06/22/2015  ? HGBA1C 5.7 03/06/2017  ? ? ?PERIPHERAL VASCULAR CATHETERIZATION ? ?Result Date: 11/02/2021 ?Patient name: DEZMEN ALCOCK MRN: [id number] DOB: 1951/04/17 Sex: male 11/02/2021 Pre-operative Diagnosis: Left leg pain Post-operative diagnosis:  Same Surgeon:  [name] Procedure Performed:  1.  Ultrasound-guided access, left femoral artery  2.  Abdominal aortogram with CO2  3.  Bilateral lower extremity runoff  4.  Conscious sedation, 36 minutes Indications: This is a 71 year old gentleman with multiple prior vascular interventions who has been having severe left back hip and foot pain.  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  He is here for further evaluation Procedure:  The patient was identified in the holding area and taken to room 8.  The patient was then placed supine on the table and prepped and draped in the usual sterile fashion.  A time out was called.  Conscious sedation was administered with the use of IV fentanyl and Versed under continuous physician and nurse monitoring.  Heart rate, blood pressure, and oxygen saturation were continuously monitored.  Total sedation time was 36 minutes.  Ultrasound was used to evaluate the right common femoral artery.  It was patent .  A digital ultrasound image was acquired.  A micropuncture needle was used to access the right common femoral artery under ultrasound guidance.  An 018 wire was advanced without resistance and a micropuncture sheath was placed.  The 018 wire was removed and a benson wire was placed.  The micropuncture sheath was exchanged for a 5 french sheath.  An omniflush catheter was advanced  over the wire to the level of L-1.  An abdominal angiogram with CO2 was obtained.  Next, the cath was pulled out of the aortic bifurcation and pelvic imaging was obtained followed additional CO2 injections to ev

## 2022-03-02 NOTE — Assessment & Plan Note (Signed)
Omnicef x 10 d 

## 2022-03-02 NOTE — Patient Instructions (Signed)
Blue-Emu cream -- use 2-3 times a day ? ?

## 2022-03-21 ENCOUNTER — Other Ambulatory Visit: Payer: Self-pay | Admitting: Internal Medicine

## 2022-03-21 NOTE — Telephone Encounter (Signed)
Check [facility] registry last filled 01/24/2022../l,b ? ? ?

## 2022-03-22 ENCOUNTER — Telehealth: Payer: Self-pay | Admitting: *Deleted

## 2022-03-22 ENCOUNTER — Other Ambulatory Visit: Payer: Self-pay | Admitting: Internal Medicine

## 2022-03-22 ENCOUNTER — Other Ambulatory Visit: Payer: Self-pay | Admitting: Physician Assistant

## 2022-03-22 ENCOUNTER — Encounter: Payer: Self-pay | Admitting: Family

## 2022-03-22 NOTE — Telephone Encounter (Signed)
Rec'd fax determination back med was approved effective dates 03/22/22 - 11/27/22. Faxing information to  [employer].Marland KitchenJohny Chess ?

## 2022-03-22 NOTE — Telephone Encounter (Signed)
Pt need proir authorization for Tramadol '50mg'$ ..Completed on cover my meds w/  (Key: [id number]). Rec'd msg stating   'Your information has been sent to [facility]. ' Will recheck for determination status later../l,b ?

## 2022-03-27 NOTE — Assessment & Plan Note (Signed)
Douglas Christensen given ?

## 2022-03-29 ENCOUNTER — Ambulatory Visit (INDEPENDENT_AMBULATORY_CARE_PROVIDER_SITE_OTHER): Payer: Medicare HMO

## 2022-03-29 DIAGNOSIS — I442 Atrioventricular block, complete: Secondary | ICD-10-CM | POA: Diagnosis not present

## 2022-03-29 LAB — CUP PACEART REMOTE DEVICE CHECK
Battery Remaining Longevity: 113 mo
Battery Voltage: 3.01 V
Brady Statistic AP VP Percent: 34.98 %
Brady Statistic AP VS Percent: 0 %
Brady Statistic AS VP Percent: 62.29 %
Brady Statistic AS VS Percent: 2.73 %
Brady Statistic RA Percent Paced: 36.1 %
Brady Statistic RV Percent Paced: 97.27 %
Date Time Interrogation Session: 20230501203029
Implantable Lead Implant Date: 20201231
Implantable Lead Implant Date: 20201231
Implantable Lead Location: 753859
Implantable Lead Location: 753860
Implantable Lead Model: 3830
Implantable Lead Model: 5076
Implantable Pulse Generator Implant Date: 20201231
Lead Channel Impedance Value: 342 Ohm
Lead Channel Impedance Value: 418 Ohm
Lead Channel Impedance Value: 456 Ohm
Lead Channel Impedance Value: 532 Ohm
Lead Channel Pacing Threshold Amplitude: 0.5 V
Lead Channel Pacing Threshold Amplitude: 0.625 V
Lead Channel Pacing Threshold Pulse Width: 0.4 ms
Lead Channel Pacing Threshold Pulse Width: 0.4 ms
Lead Channel Sensing Intrinsic Amplitude: 31.625 mV
Lead Channel Sensing Intrinsic Amplitude: 31.625 mV
Lead Channel Sensing Intrinsic Amplitude: 5.5 mV
Lead Channel Sensing Intrinsic Amplitude: 5.5 mV
Lead Channel Setting Pacing Amplitude: 1.5 V
Lead Channel Setting Pacing Amplitude: 2.5 V
Lead Channel Setting Pacing Pulse Width: 0.4 ms
Lead Channel Setting Sensing Sensitivity: 1.2 mV

## 2022-04-13 NOTE — Progress Notes (Signed)
Remote pacemaker transmission.   

## 2022-04-19 ENCOUNTER — Other Ambulatory Visit: Payer: Self-pay | Admitting: Internal Medicine

## 2022-05-10 ENCOUNTER — Ambulatory Visit (INDEPENDENT_AMBULATORY_CARE_PROVIDER_SITE_OTHER): Payer: Medicare HMO

## 2022-05-10 DIAGNOSIS — Z0000 Encounter for general adult medical examination without abnormal findings: Secondary | ICD-10-CM

## 2022-05-10 NOTE — Progress Notes (Addendum)
I connected with Douglas Christensen today by telephone and verified that I am speaking with the correct person using two identifiers. Location patient: home Location provider: work Persons participating in the virtual visit: patient, provider.   I discussed the limitations, risks, security and privacy concerns of performing an evaluation and management service by telephone and the availability of in person appointments. I also discussed with the patient that there may be a patient responsible charge related to this service. The patient expressed understanding and verbally consented to this telephonic visit.    Interactive audio and video telecommunications were attempted between this provider and patient, however failed, due to patient having technical difficulties OR patient did not have access to video capability.  We continued and completed visit with audio only.  Some vital signs may be absent or patient reported.   Time Spent with patient on telephone encounter: 30 minutes  Subjective:   Douglas Christensen is a 71 y.o. male who presents for Medicare Annual/Subsequent preventive examination.  Review of Systems     Cardiac Risk Factors include: advanced age (>49mn, >>58women);hypertension;family history of premature cardiovascular disease;male gender;obesity (BMI >30kg/m2);sedentary lifestyle     Objective:    There were no vitals filed for this visit. There is no height or weight on file to calculate BMI.     05/10/2022    4:39 PM 11/02/2021    6:17 AM 09/26/2021    1:52 PM 06/25/2021    1:52 PM 12/28/2020    4:41 PM 07/17/2020    8:57 AM 04/20/2020    6:27 AM  Advanced Directives  Does Patient Have a Medical Advance Directive? No No No No No No No  Would patient like information on creating a medical advance directive? No - Patient declined Yes (MAU/Ambulatory/Procedural Areas - Information given) No - Patient declined No - Patient declined No - Patient declined No - Patient declined No -  Patient declined    Current Medications (verified) Outpatient Encounter Medications as of 05/10/2022  Medication Sig   triamterene-hydrochlorothiazide (MAXZIDE-25) 37.5-25 MG tablet Take 2 tablets by mouth daily. Annual appt due in Nov must see provider for future refills   acetaminophen (TYLENOL) 650 MG CR tablet Take 1,300 mg by mouth every 8 (eight) hours as needed for pain.   Ascorbic Acid (VITAMIN C PO) Take 1 tablet by mouth in the morning.   aspirin EC 81 MG tablet Take 81 mg by mouth every other day. Swallow whole.   cefdinir (OMNICEF) 300 MG capsule Take 1 capsule (300 mg total) by mouth 2 (two) times daily.   cholecalciferol (VITAMIN D) 1000 units tablet Take 1,000 Units by mouth daily.    clonazePAM (KLONOPIN) 0.5 MG tablet TAKE ONE TABLET BY MOUTH TWICE DAILY AS NEEDED FOR ANXIETY   cyclobenzaprine (FLEXERIL) 10 MG tablet Take 1 tablet (10 mg total) by mouth 3 (three) times daily as needed for muscle spasms.   FLUZONE HIGH-DOSE QUADRIVALENT 0.7 ML SUSY    Lidocaine 4 % PTCH Place 1 patch onto the skin daily as needed (back pain.). (Patient not taking: Reported on 03/02/2022)   losartan (COZAAR) 50 MG tablet Take 1 tablet (Total 50 mg) in the morning (AM) and 1/2 (25 mg) tablet in the evening (PM). Please make overdue appt with Dr. CBurt Knack Thank you 1st attempt   metoprolol succinate (TOPROL-XL) 50 MG 24 hr tablet Take 1 tablet (50 mg total) by mouth daily. Please schedule appt for future refills. 1st attempt   rosuvastatin (CRESTOR) 40  MG tablet Take 1 tablet (40 mg total) by mouth 3 (three) times a week for 28 days. Patient needs appointment for future refills. Please call office to schedule appointment @ [phone]. 1st attempt.   tadalafil (CIALIS) 20 MG tablet Take 1 tablet (20 mg total) by mouth every three (3) days as needed for erectile dysfunction.   traMADol (ULTRAM) 50 MG tablet Take 1 tablet (50 mg total) by mouth every 6 (six) hours as needed.   vitamin B-12  (CYANOCOBALAMIN) 1000 MCG tablet Take 1,000 mcg by mouth daily.   No facility-administered encounter medications on file as of 05/10/2022.    Allergies (verified) Roxicodone [oxycodone hcl], Hydralazine, Hydrocodone, Oxycodone, Benazepril, Gabapentin, Itraconazole, and Lipitor [atorvastatin calcium]   History: Past Medical History:  Diagnosis Date   Anxiety    Arthritis    CAD (coronary artery disease)    Mild plaque (cath "years ago"); abnormal Myoview 04/2013 with subsequent CABG x 5 with LIMA to LAD, SVG to OM1, SVG to DX, SVG to PD & PL.    Carotid artery disease ([place])    Carotid US 07/2019 bilateral ICA 40-59; bilateral subclavian stenosis // Carotid US 9/21: Bilateral ICA 40-59; L VA occluded; R subclavian stenosis   Cataract    GERD (gastroesophageal reflux disease)    History of colonic polyps    History of echocardiogram    Echo 2/19: Mild LVH, EF 60-65, normal wall motion, grade 2 diastolic dysfunction, mild RAE   History of nuclear stress test    Myoview 2/19: inf/inf-lat/apical inf/ant-sept defect (?diaph atten - cannot rule out peri-infarct ischemia), PVCs/PACs/Mobitz 1 // Myoview 05/2019: EF 60, inf infarct with mild peri-infarct ischemia; no significant change when compared to prior study; Intermediate Risk   Hx of echocardiogram    Echo (9/15):  EF 55-60%; Gr 2 DD, mild BAE   Hyperlipidemia    Hypertension    LBP (low back pain)    Meralgia paresthetica of left side 2011   Polycythemia, secondary 02/03/2020   PVD (peripheral vascular disease) ([place])    Stent to left common femoral and right superficial femoral.  2008.  50%  left renal    Second degree AV block, Mobitz type I    Holter 2/19: Sinus rhythm, average heart rate 72, frequent PVCs (burden 5%), second-degree type I AV block and periods of 2:1 heart block >> continue clinical managment and avoid AVN blocking agents   Shortness of breath    "once in awhile; can happen at anytime" (08/26/2013)   Sleep apnea    mod  OSA, central sleep apnea/hypoapnea syndrome 11/22/12, CPAP every night    Subclavian artery stenosis ([place])    Carotid US 07/2019 bilateral ICA 40-59; bilateral subclavian stenosis   Vitamin D deficiency    Past Surgical History:  Procedure Laterality Date   ABDOMINAL AORTAGRAM N/A 11/16/2011   Procedure: ABDOMINAL Maxcine Ham;  Surgeon: Sherren Mocha, MD;  Location: [facility] CATH LAB;  Service: Cardiovascular;  Laterality: N/A;   ABDOMINAL AORTAGRAM N/A 11/14/2012   Procedure: ABDOMINAL Maxcine Ham;  Surgeon: Sherren Mocha, MD;  Location: [facility] CATH LAB;  Service: Cardiovascular;  Laterality: N/A;   ABDOMINAL AORTAGRAM N/A 12/30/2014   Procedure: ABDOMINAL Maxcine Ham;  Surgeon: Serafina Mitchell, MD;  Location: [facility] CATH LAB;  Service: Cardiovascular;  Laterality: N/A;   ABDOMINAL AORTOGRAM W/LOWER EXTREMITY Bilateral 08/06/2019   Procedure: ABDOMINAL AORTOGRAM W/LOWER EXTREMITY;  Surgeon: Serafina Mitchell, MD;  Location: [place] CV LAB;  Service: Cardiovascular;  Laterality: Bilateral;   ABDOMINAL AORTOGRAM  W/LOWER EXTREMITY Bilateral 11/02/2021   Procedure: ABDOMINAL AORTOGRAM W/LOWER EXTREMITY;  Surgeon: Serafina Mitchell, MD;  Location: [place] CV LAB;  Service: Cardiovascular;  Laterality: Bilateral;   CARDIAC CATHETERIZATION  05/02/13   x2    CORONARY ARTERY BYPASS GRAFT N/A 05/06/2013   Procedure: CORONARY ARTERY BYPASS GRAFTING (CABG);  Surgeon: Melrose Nakayama, MD;  Location: [place];  Service: Open Heart Surgery;  Laterality: N/A;  Coronary artery bypass graft times five using left internal mammary artery and left greater saphenous vein via endovein harvest.   ENDARTERECTOMY FEMORAL Left 01/29/2015   Procedure: ENDARTERECTOMY FEMORAL WITH PATCH ANGIOPLASTY;  Surgeon: Serafina Mitchell, MD;  Location: [facility] OR;  Service: Vascular;  Laterality: Left;   EYE SURGERY  03/24/16   cataract surgery on left eye   FEMORAL-POPLITEAL BYPASS GRAFT  12/27/2012   Procedure: BYPASS GRAFT FEMORAL-POPLITEAL ARTERY;   Surgeon: Serafina Mitchell, MD;  Location: [facility] OR;  Service: Vascular;  Laterality: Right;  using non-reversed sapphenous vein.   ILIAC ATHERECTOMY Left 01/29/2015   Procedure: SUPERFICIAL FEMORAL ARTERY ATHERECTOMY/PERCUTANEOUS TRANSLUMINAL ANGIOPLASTY; superficial femoral artery stent;  Surgeon: Serafina Mitchell, MD;  Location: [place];  Service: Vascular;  Laterality: Left;   LOWER EXTREMITY ANGIOGRAM Bilateral 11/16/2011   Procedure: LOWER EXTREMITY ANGIOGRAM;  Surgeon: Sherren Mocha, MD;  Location: [facility] CATH LAB;  Service: Cardiovascular;  Laterality: Bilateral;   lower extremity stents     bilateral lower extremities x 2   PACEMAKER IMPLANT N/A 11/28/2019   Procedure: PACEMAKER IMPLANT;  Surgeon: Thompson Grayer, MD;  Location: [place] CV LAB;  Service: Cardiovascular;  Laterality: N/A;   PERCUTANEOUS STENT INTERVENTION Left 11/16/2011   Procedure: PERCUTANEOUS STENT INTERVENTION;  Surgeon: Sherren Mocha, MD;  Location: [facility] CATH LAB;  Service: Cardiovascular;  Laterality: Left;   PERIPHERAL VASCULAR ATHERECTOMY Right 08/06/2019   Procedure: PERIPHERAL VASCULAR ATHERECTOMY;  Surgeon: Serafina Mitchell, MD;  Location: [place] CV LAB;  Service: Cardiovascular;  Laterality: Right;  Common iliac, external iliac, and popliteal.   PERIPHERAL VASCULAR INTERVENTION Right 08/06/2019   Procedure: PERIPHERAL VASCULAR INTERVENTION;  Surgeon: Serafina Mitchell, MD;  Location: [place] CV LAB;  Service: Cardiovascular;  Laterality: Right;  common iliac, external iliac, and popliteal.   TONSILLECTOMY     TOTAL HIP ARTHROPLASTY Left 06/29/2015   Procedure: TOTAL HIP ARTHROPLASTY;  Surgeon: Frederik Pear, MD;  Location: [place];  Service: Orthopedics;  Laterality: Left;  LEFT TOTAL HIP ARTHROPLASTY DEPUY SROM/PINNACLE   Family History  Problem Relation Age of Onset   Heart disease Mother        No clear CAD and Heart Disease before age 22   Hypertension Mother    Cancer Mother        ? colon ca   Hyperlipidemia  Mother    Cancer Father        brain tumor   Alzheimer's disease Father    Colon cancer Neg Hx    Heart attack Neg Hx    Esophageal cancer Neg Hx    Liver cancer Neg Hx    Rectal cancer Neg Hx    Stomach cancer Neg Hx    Pancreatic cancer Neg Hx    Social History   Socioeconomic History   Marital status: Widowed    Spouse name: Not on file   Number of children: 2   Years of education: Not on file   Highest education level: Not on file  Occupational History   Occupation: Tax adviser  Employer: [employer]  Tobacco Use   Smoking status: Former    Packs/day: 2.00    Years: 47.00    Total pack years: 94.00    Types: Cigarettes    Quit date: 12/17/2012    Years since quitting: 9.4   Smokeless tobacco: Never  Vaping Use   Vaping Use: Never used  Substance and Sexual Activity   Alcohol use: Yes    Alcohol/week: 4.0 standard drinks of alcohol    Types: 4 Shots of liquor per week    Comment: 08/26/2013 "3-4 mixed drinks/wk"   Drug use: No   Sexual activity: Yes  Other Topics Concern   Not on file  Social History Narrative   Widower   Social Determinants of Health   Financial Resource Strain: Low Risk  (05/10/2022)   Overall Financial Resource Strain (CARDIA)    Difficulty of Paying Living Expenses: Not hard at all  Food Insecurity: No Food Insecurity (05/10/2022)   Hunger Vital Sign    Worried About Running Out of Food in the Last Year: Never true    Ran Out of Food in the Last Year: Never true  Transportation Needs: No Transportation Needs (05/10/2022)   PRAPARE - Hydrologist (Medical): No    Lack of Transportation (Non-Medical): No  Physical Activity: Inactive (05/10/2022)   Exercise Vital Sign    Days of Exercise per Week: 0 days    Minutes of Exercise per Session: 0 min  Stress: No Stress Concern Present (05/10/2022)   [place]    Feeling of Stress : Not at all   Social Connections: Socially Isolated (05/10/2022)   Social Connection and Isolation Panel [NHANES]    Frequency of Communication with Friends and Family: More than three times a week    Frequency of Social Gatherings with Friends and Family: More than three times a week    Attends Religious Services: Never    Marine scientist or Organizations: No    Attends Archivist Meetings: Never    Marital Status: Widowed    Tobacco Counseling Counseling given: Not Answered   Clinical Intake:  Pre-visit preparation completed: Yes  Pain : No/denies pain     BMI - recorded: 32.79 Nutritional Status: BMI > 30  Obese Nutritional Risks: None Diabetes: No  How often do you need to have someone help you when you read instructions, pamphlets, or other written materials from your doctor or pharmacy?: 1 - Never What is the last grade level you completed in school?: HSG  Diabetic? no  Interpreter Needed?: No  Information entered by :: Lisette Abu, LPN.   Activities of Daily Living    05/10/2022    4:56 PM  In your present state of health, do you have any difficulty performing the following activities:  Hearing? 0  Vision? 0  Difficulty concentrating or making decisions? 0  Walking or climbing stairs? 0  Dressing or bathing? 0  Doing errands, shopping? 0  Preparing Food and eating ? N  Using the Toilet? N  In the past six months, have you accidently leaked urine? N  Do you have problems with loss of bowel control? N  Managing your Medications? N  Managing your Finances? N  Housekeeping or managing your Housekeeping? N    Patient Care Team: Plotnikov, Evie Lacks, MD as PCP - Cyndia Diver, MD as PCP - Cardiology (Cardiology) Thompson Grayer, MD as PCP -  Electrophysiology (Cardiology) Frederik Pear, MD as Consulting Physician (Orthopedic Surgery) Chesley Mires, MD as Consulting Physician (Pulmonary Disease) Ladene Artist, MD as Consulting Physician  (Gastroenterology) Warden Fillers, MD as Consulting Physician (Ophthalmology)  Indicate any recent Medical Services you may have received from other than [facility] providers in the past year (date may be approximate).     Assessment:   This is a routine wellness examination for Douglas Christensen.  Hearing/Vision screen Hearing Screening - Comments:: No hearing aids. Vision Screening - Comments:: Patient wears eyeglasses.  Dietary issues and exercise activities discussed: Current Exercise Habits: The patient does not participate in regular exercise at present, Exercise limited by: respiratory conditions(s);cardiac condition(s);orthopedic condition(s)   Goals Addressed             This Visit's Progress    My goal is to join the gym and get control of my shortness of breath.        Depression Screen    05/10/2022    4:54 PM 07/06/2021    9:20 AM 07/02/2020    1:30 PM 03/09/2018   11:03 AM 03/01/2017    9:48 AM 04/29/2016    2:50 PM 02/12/2014   10:53 AM  PHQ 2/9 Scores  PHQ - 2 Score 0 0 0 0 0 0 0    Fall Risk    05/10/2022    4:56 PM 07/06/2021    9:20 AM 07/02/2020    1:31 PM 07/13/2018    4:53 PM 03/09/2018   11:03 AM  [place] in the past year? 0 0 0 No No  Number falls in past yr: 0  0    Injury with Fall? 0  0    Risk for fall due to : No Fall Risks      Follow up Falls evaluation completed        FALL RISK PREVENTION PERTAINING TO THE HOME:  Any stairs in or around the home? No  If so, are there any without handrails? No  Home free of loose throw rugs in walkways, pet beds, electrical cords, etc? Yes  Adequate lighting in your home to reduce risk of falls? Yes   ASSISTIVE DEVICES UTILIZED TO PREVENT FALLS:  Life alert? No  Use of a cane, walker or w/c? No  Grab bars in the bathroom? Yes  Shower chair or bench in shower? No  Elevated toilet seat or a handicapped toilet? No   TIMED UP AND GO:  Was the test performed? No .  Length of time to ambulate 10 feet: n/a  sec.   Appearance of gait: Patient not evaluated for gait during this visit.   Cognitive Function:    07/13/2018    3:39 PM  MMSE - Mini Mental State Exam  Orientation to time 5  Orientation to Place 5  Registration 3  Attention/ Calculation 5  Recall 3  Language- name 2 objects 2  Language- repeat 1  Language- follow 3 step command 3  Language- read & follow direction 1  Write a sentence 1  Copy design 1  Total score 30        05/10/2022    4:56 PM  6CIT Screen  What Year? 0 points  What month? 0 points  What time? 0 points  Count back from 20 0 points  Months in reverse 0 points  Repeat phrase 0 points  Total Score 0 points    Immunizations Immunization History  Administered Date(s) Administered   Influenza Inj Mdck  Quad With Preservative 10/31/2018, 07/16/2019   Influenza Split 12/28/2012   Influenza Whole 08/18/2010   Influenza, High Dose Seasonal PF 09/05/2016, 09/06/2017, 10/14/2021   Influenza,inj,Quad PF,6+ Mos 07/22/2015   Influenza-Unspecified 01/16/2014   PFIZER(Purple Top)SARS-COV-2 Vaccination 01/02/2020, 01/23/2020, 08/23/2020   Pneumococcal Conjugate-13 01/27/2014   Pneumococcal Polysaccharide-23 08/28/2006   Tdap 03/01/2017    TDAP status: Up to date  Flu Vaccine status: Up to date  Pneumococcal vaccine status: Due, Education has been provided regarding the importance of this vaccine. Advised may receive this vaccine at local pharmacy or Health Dept. Aware to provide a copy of the vaccination record if obtained from local pharmacy or Health Dept. Verbalized acceptance and understanding.  Covid-19 vaccine status: Information provided on how to obtain vaccines.   Qualifies for Shingles Vaccine? Yes   Zostavax completed No   Shingrix Completed?: No.    Education has been provided regarding the importance of this vaccine. Patient has been advised to call insurance company to determine out of pocket expense if they have not yet received this  vaccine. Advised may also receive vaccine at local pharmacy or Health Dept. Verbalized acceptance and understanding.  Screening Tests Health Maintenance  Topic Date Due   Zoster Vaccines- Shingrix (1 of 2) Never done   Pneumonia Vaccine 71+ Years old (3 - PPSV23 if available, else PCV20) 02/28/2016   COVID-19 Vaccine (4 - Booster for Pfizer series) 10/18/2020   COLONOSCOPY (Pts 45-62yr Insurance coverage will need to be confirmed)  03/21/2021   INFLUENZA VACCINE  06/28/2022   TETANUS/TDAP  03/02/2027   Hepatitis C Screening  Completed   HPV VACCINES  Aged Out    Health Maintenance  Health Maintenance Due  Topic Date Due   Zoster Vaccines- Shingrix (1 of 2) Never done   Pneumonia Vaccine 71 Years old (3 - PPSV23 if available, else PCV20) 02/28/2016   COVID-19 Vaccine (4 - Booster for [place]) 10/18/2020   COLONOSCOPY (Pts 45-431yrInsurance coverage will need to be confirmed)  03/21/2021    Colorectal cancer screening: Type of screening: Colonoscopy. Completed 03/21/2018. Repeat every 3 years  Lung Cancer Screening: (Low Dose CT Chest recommended if Age 71-80ears, 30 pack-year currently smoking OR have quit w/in 15years.) does not qualify.   Lung Cancer Screening Referral: no  Additional Screening:  Hepatitis C Screening: does qualify; Completed 03/11/2016  Vision Screening: Recommended annual ophthalmology exams for early detection of glaucoma and other disorders of the eye. Is the patient up to date with their annual eye exam?  Yes  Who is the provider or what is the name of the office in which the patient attends annual eye exams? Last seen by Dr. ChWarden Fillersno new eye doctor If pt is not established with a provider, would they like to be referred to a provider to establish care? No .   Dental Screening: Recommended annual dental exams for proper oral hygiene  Community Resource Referral / Chronic Care Management: CRR required this visit?  No   CCM  required this visit?  No      Plan:     I have personally reviewed and noted the following in the patient's chart:   Medical and social history Use of alcohol, tobacco or illicit drugs  Current medications and supplements including opioid prescriptions. Patient is not currently taking opioid prescriptions. Functional ability and status Nutritional status Physical activity Advanced directives List of other physicians Hospitalizations, surgeries, and ER visits in previous 12 months Vitals Screenings to include  cognitive, depression, and falls Referrals and appointments  In addition, I have reviewed and discussed with patient certain preventive protocols, quality metrics, and best practice recommendations. A written personalized care plan for preventive services as well as general preventive health recommendations were provided to patient.     Sheral Flow, LPN   06/03/8674   Nurse Notes:  There were no vitals filed for this visit. There is no height or weight on file to calculate BMI.  Medical screening examination/treatment/procedure(s) were performed by non-physician practitioner and as supervising physician I was immediately available for consultation/collaboration.  I agree with above. Lew Dawes, MD

## 2022-05-10 NOTE — Patient Instructions (Signed)
Douglas Christensen , Thank you for taking time to come for your Medicare Wellness Visit. I appreciate your ongoing commitment to your health goals. Please review the following plan we discussed and let me know if I can assist you in the future.   Screening recommendations/referrals: Colonoscopy: 03/21/2018; due every 3 years Recommended yearly ophthalmology/optometry visit for glaucoma screening and checkup Recommended yearly dental visit for hygiene and checkup  Vaccinations: Influenza vaccine: 10/14/2021 Pneumococcal vaccine: 08/28/2006, 01/27/2014 Tdap vaccine: 03/01/2017 Shingles vaccine: never done   Covid-19: 01/02/2020, 01/23/2020, 08/23/2020  Advanced directives: No  Conditions/risks identified: Yes  Next appointment: Please schedule your next Medicare Wellness Visit with your Nurse Health Advisor in 1 year by calling [phone].  Preventive Care 71 Years and Older, Male Preventive care refers to lifestyle choices and visits with your health care provider that can promote health and wellness. What does preventive care include? A yearly physical exam. This is also called an annual well check. Dental exams once or twice a year. Routine eye exams. Ask your health care provider how often you should have your eyes checked. Personal lifestyle choices, including: Daily care of your teeth and gums. Regular physical activity. Eating a healthy diet. Avoiding tobacco and drug use. Limiting alcohol use. Practicing safe sex. Taking low doses of aspirin every day. Taking vitamin and mineral supplements as recommended by your health care provider. What happens during an annual well check? The services and screenings done by your health care provider during your annual well check will depend on your age, overall health, lifestyle risk factors, and family history of disease. Counseling  Your health care provider may ask you questions about your: Alcohol use. Tobacco use. Drug use. Emotional  well-being. Home and relationship well-being. Sexual activity. Eating habits. History of falls. Memory and ability to understand (cognition). Work and work Statistician. Screening  You may have the following tests or measurements: Height, weight, and BMI. Blood pressure. Lipid and cholesterol levels. These may be checked every 5 years, or more frequently if you are over 56 years old. Skin check. Lung cancer screening. You may have this screening every year starting at age 68 if you have a 30-pack-year history of smoking and currently smoke or have quit within the past 15 years. Fecal occult blood test (FOBT) of the stool. You may have this test every year starting at age 17. Flexible sigmoidoscopy or colonoscopy. You may have a sigmoidoscopy every 5 years or a colonoscopy every 10 years starting at age 62. Prostate cancer screening. Recommendations will vary depending on your family history and other risks. Hepatitis C blood test. Hepatitis B blood test. Sexually transmitted disease (STD) testing. Diabetes screening. This is done by checking your blood sugar (glucose) after you have not eaten for a while (fasting). You may have this done every 1-3 years. Abdominal aortic aneurysm (AAA) screening. You may need this if you are a current or former smoker. Osteoporosis. You may be screened starting at age 64 if you are at high risk. Talk with your health care provider about your test results, treatment options, and if necessary, the need for more tests. Vaccines  Your health care provider may recommend certain vaccines, such as: Influenza vaccine. This is recommended every year. Tetanus, diphtheria, and acellular pertussis (Tdap, Td) vaccine. You may need a Td booster every 10 years. Zoster vaccine. You may need this after age 56. Pneumococcal 13-valent conjugate (PCV13) vaccine. One dose is recommended after age 23. Pneumococcal polysaccharide (PPSV23) vaccine. One dose is recommended  after  age 48. Talk to your health care provider about which screenings and vaccines you need and how often you need them. This information is not intended to replace advice given to you by your health care provider. Make sure you discuss any questions you have with your health care provider. Document Released: 12/11/2015 Document Revised: 08/03/2016 Document Reviewed: 09/15/2015 Elsevier Interactive Patient Education  2017 [employer] Prevention in the Home Falls can cause injuries. They can happen to people of all ages. There are many things you can do to make your home safe and to help prevent falls. What can I do on the outside of my home? Regularly fix the edges of walkways and driveways and fix any cracks. Remove anything that might make you trip as you walk through a door, such as a raised step or threshold. Trim any bushes or trees on the path to your home. Use bright outdoor lighting. Clear any walking paths of anything that might make someone trip, such as rocks or tools. Regularly check to see if handrails are loose or broken. Make sure that both sides of any steps have handrails. Any raised decks and porches should have guardrails on the edges. Have any leaves, snow, or ice cleared regularly. Use sand or salt on walking paths during winter. Clean up any spills in your garage right away. This includes oil or grease spills. What can I do in the bathroom? Use night lights. Install grab bars by the toilet and in the tub and shower. Do not use towel bars as grab bars. Use non-skid mats or decals in the tub or shower. If you need to sit down in the shower, use a plastic, non-slip stool. Keep the floor dry. Clean up any water that spills on the floor as soon as it happens. Remove soap buildup in the tub or shower regularly. Attach bath mats securely with double-sided non-slip rug tape. Do not have throw rugs and other things on the floor that can make you trip. What can I do in the  bedroom? Use night lights. Make sure that you have a light by your bed that is easy to reach. Do not use any sheets or blankets that are too big for your bed. They should not hang down onto the floor. Have a firm chair that has side arms. You can use this for support while you get dressed. Do not have throw rugs and other things on the floor that can make you trip. What can I do in the kitchen? Clean up any spills right away. Avoid walking on wet floors. Keep items that you use a lot in easy-to-reach places. If you need to reach something above you, use a strong step stool that has a grab bar. Keep electrical cords out of the way. Do not use floor polish or wax that makes floors slippery. If you must use wax, use non-skid floor wax. Do not have throw rugs and other things on the floor that can make you trip. What can I do with my stairs? Do not leave any items on the stairs. Make sure that there are handrails on both sides of the stairs and use them. Fix handrails that are broken or loose. Make sure that handrails are as long as the stairways. Check any carpeting to make sure that it is firmly attached to the stairs. Fix any carpet that is loose or worn. Avoid having throw rugs at the top or bottom of the stairs. If you  do have throw rugs, attach them to the floor with carpet tape. Make sure that you have a light switch at the top of the stairs and the bottom of the stairs. If you do not have them, ask someone to add them for you. What else can I do to help prevent falls? Wear shoes that: Do not have high heels. Have rubber bottoms. Are comfortable and fit you well. Are closed at the toe. Do not wear sandals. If you use a stepladder: Make sure that it is fully opened. Do not climb a closed stepladder. Make sure that both sides of the stepladder are locked into place. Ask someone to hold it for you, if possible. Clearly mark and make sure that you can see: Any grab bars or  handrails. First and last steps. Where the edge of each step is. Use tools that help you move around (mobility aids) if they are needed. These include: Canes. Walkers. Scooters. Crutches. Turn on the lights when you go into a dark area. Replace any light bulbs as soon as they burn out. Set up your furniture so you have a clear path. Avoid moving your furniture around. If any of your floors are uneven, fix them. If there are any pets around you, be aware of where they are. Review your medicines with your doctor. Some medicines can make you feel dizzy. This can increase your chance of falling. Ask your doctor what other things that you can do to help prevent falls. This information is not intended to replace advice given to you by your health care provider. Make sure you discuss any questions you have with your health care provider. Document Released: 09/10/2009 Document Revised: 04/21/2016 Document Reviewed: 12/19/2014 Elsevier Interactive Patient Education  2017 [employer].

## 2022-06-01 ENCOUNTER — Other Ambulatory Visit: Payer: Self-pay | Admitting: Cardiovascular Disease

## 2022-06-28 ENCOUNTER — Ambulatory Visit (INDEPENDENT_AMBULATORY_CARE_PROVIDER_SITE_OTHER): Payer: Medicare HMO

## 2022-06-28 DIAGNOSIS — I442 Atrioventricular block, complete: Secondary | ICD-10-CM

## 2022-06-28 LAB — CUP PACEART REMOTE DEVICE CHECK
Battery Remaining Longevity: 110 mo
Battery Voltage: 3.01 V
Brady Statistic AP VP Percent: 21.54 %
Brady Statistic AP VS Percent: 0.02 %
Brady Statistic AS VP Percent: 74.27 %
Brady Statistic AS VS Percent: 4.17 %
Brady Statistic RA Percent Paced: 22.44 %
Brady Statistic RV Percent Paced: 95.81 %
Date Time Interrogation Session: 20230731190610
Implantable Lead Implant Date: 20201231
Implantable Lead Implant Date: 20201231
Implantable Lead Location: 753859
Implantable Lead Location: 753860
Implantable Lead Model: 3830
Implantable Lead Model: 5076
Implantable Pulse Generator Implant Date: 20201231
Lead Channel Impedance Value: 323 Ohm
Lead Channel Impedance Value: 418 Ohm
Lead Channel Impedance Value: 475 Ohm
Lead Channel Impedance Value: 532 Ohm
Lead Channel Pacing Threshold Amplitude: 0.625 V
Lead Channel Pacing Threshold Amplitude: 0.75 V
Lead Channel Pacing Threshold Pulse Width: 0.4 ms
Lead Channel Pacing Threshold Pulse Width: 0.4 ms
Lead Channel Sensing Intrinsic Amplitude: 5 mV
Lead Channel Sensing Intrinsic Amplitude: 5 mV
Lead Channel Sensing Intrinsic Amplitude: 7.125 mV
Lead Channel Sensing Intrinsic Amplitude: 7.125 mV
Lead Channel Setting Pacing Amplitude: 1.5 V
Lead Channel Setting Pacing Amplitude: 2.5 V
Lead Channel Setting Pacing Pulse Width: 0.4 ms
Lead Channel Setting Sensing Sensitivity: 1.2 mV

## 2022-07-04 ENCOUNTER — Other Ambulatory Visit: Payer: Self-pay | Admitting: *Deleted

## 2022-07-05 ENCOUNTER — Other Ambulatory Visit: Payer: Self-pay | Admitting: Cardiovascular Disease

## 2022-07-11 ENCOUNTER — Other Ambulatory Visit: Payer: Self-pay | Admitting: Cardiovascular Disease

## 2022-07-11 ENCOUNTER — Telehealth: Payer: Self-pay | Admitting: Internal Medicine

## 2022-07-11 MED ORDER — TADALAFIL 20 MG PO TABS: 20.0000 mg | ORAL_TABLET | ORAL | 0 refills | Status: DC | PRN

## 2022-07-11 NOTE — Telephone Encounter (Signed)
Sent renewal to pof.Marland KitchenJohny Chess

## 2022-07-11 NOTE — Telephone Encounter (Signed)
Caller & Relationship to patient: Douglas Christensen  Call back number: [phone]  Date of last office visit: 03/02/22  Date of next office visit:   Medication(s) to be refilled:  tadalafil (CIALIS) 20 MG tablet  Preferred Pharmacy:  [facility], [place] Phone:  [phone]  Fax:  [phone]

## 2022-07-25 ENCOUNTER — Other Ambulatory Visit: Payer: Self-pay | Admitting: Family

## 2022-07-25 ENCOUNTER — Ambulatory Visit (INDEPENDENT_AMBULATORY_CARE_PROVIDER_SITE_OTHER): Payer: Medicare HMO | Admitting: Internal Medicine

## 2022-07-25 ENCOUNTER — Encounter: Payer: Self-pay | Admitting: Internal Medicine

## 2022-07-25 DIAGNOSIS — M545 Low back pain, unspecified: Secondary | ICD-10-CM | POA: Diagnosis not present

## 2022-07-25 DIAGNOSIS — R233 Spontaneous ecchymoses: Secondary | ICD-10-CM | POA: Diagnosis not present

## 2022-07-25 DIAGNOSIS — G8929 Other chronic pain: Secondary | ICD-10-CM

## 2022-07-25 DIAGNOSIS — D751 Secondary polycythemia: Secondary | ICD-10-CM

## 2022-07-25 DIAGNOSIS — M5440 Lumbago with sciatica, unspecified side: Secondary | ICD-10-CM

## 2022-07-25 DIAGNOSIS — B079 Viral wart, unspecified: Secondary | ICD-10-CM

## 2022-07-25 DIAGNOSIS — T148XXA Other injury of unspecified body region, initial encounter: Secondary | ICD-10-CM

## 2022-07-25 DIAGNOSIS — D500 Iron deficiency anemia secondary to blood loss (chronic): Secondary | ICD-10-CM

## 2022-07-25 MED ORDER — PREDNISONE 10 MG PO TABS: ORAL_TABLET | ORAL | 1 refills | Status: DC

## 2022-07-25 NOTE — Assessment & Plan Note (Signed)
OV w/Dr Marin Olp tomorrow w/labs

## 2022-07-25 NOTE — Assessment & Plan Note (Signed)
MSK pain Blue-Emu cream  Rice sock Prednisone 10 mg: take 4 tabs a day x 3 days; then 3 tabs a day x 4 days; then 2 tabs a day x 4 days, then 1 tab a day x 6 days, then stop. Take pc.

## 2022-07-25 NOTE — Assessment & Plan Note (Signed)
See Cryo - legs, arms Skin bx L forearm if relapsed

## 2022-07-25 NOTE — Patient Instructions (Addendum)
  Blue-Emu cream  Rice sock Try Arnica cream for bruises   Postprocedure instructions :     Keep the wounds clean. You can wash them with liquid soap and water. Pat dry with gauze or a Kleenex tissue  Before applying antibiotic ointment and a Band-Aid.   You need to report immediately  if  any signs of infection develop.

## 2022-07-25 NOTE — Assessment & Plan Note (Addendum)
MSK pain Check urinalysis Blue-Emu cream  Rice sock Prednisone 10 mg: take 4 tabs a day x 3 days; then 3 tabs a day x 4 days; then 2 tabs a day x 4 days, then 1 tab a day x 6 days, then stop. Take pc. Labs w/Dr Marin Olp tomorrow

## 2022-07-25 NOTE — Progress Notes (Signed)
Subjective:  Patient ID: Douglas Christensen, male    DOB: 02-Apr-1951  Age: 71 y.o. MRN: [id number]  CC: Follow-up (Pt c/o spot on Arm & Hips) and Hip Pain (Pt states he has been having hip pain. Went to [facility] urgent care. Was given Onicef, Prednisone pack, and flexeril. Once taken all med pain has came back)   HPI [name] presents for a c/o spot on Arm & Hips and Hip Pain (Pt states he has been having hip pain. Went to [facility] urgent care. Was given Onicef, Prednisone pack, and flexeril. Once taken all med pain has came back. Pt is driving a taxi now  Outpatient Medications Prior to Visit  Medication Sig Dispense Refill   acetaminophen (TYLENOL) 650 MG CR tablet Take 1,300 mg by mouth every 8 (eight) hours as needed for pain.     Ascorbic Acid (VITAMIN C PO) Take 1 tablet by mouth in the morning.     aspirin EC 81 MG tablet Take 81 mg by mouth every other day. Swallow whole.     cholecalciferol (VITAMIN D) 1000 units tablet Take 1,000 Units by mouth daily.      clonazePAM (KLONOPIN) 0.5 MG tablet TAKE ONE TABLET BY MOUTH TWICE DAILY AS NEEDED FOR ANXIETY 60 tablet 3   FLUZONE HIGH-DOSE QUADRIVALENT 0.7 ML SUSY      losartan (COZAAR) 50 MG tablet Take 1 tablet (Total 50 mg) in the morning (AM) and 1/2 (25 mg) tablet in the evening (PM). Please make overdue appt with Dr. Burt Knack. Thank you 1st attempt 45 tablet 0   metoprolol succinate (TOPROL-XL) 50 MG 24 hr tablet Take 1 tablet (50 mg total) by mouth daily. Please schedule appt for future refills. 1st attempt 90 tablet 0   tadalafil (CIALIS) 20 MG tablet Take 1 tablet (20 mg total) by mouth every three (3) days as needed for erectile dysfunction. 30 tablet 0   traMADol (ULTRAM) 50 MG tablet Take 1 tablet (50 mg total) by mouth every 6 (six) hours as needed. 120 tablet 5   triamterene-hydrochlorothiazide (MAXZIDE-25) 37.5-25 MG tablet Take 2 tablets by mouth daily. Annual appt due in Nov must see provider for future refills 60 tablet 5    vitamin B-12 (CYANOCOBALAMIN) 1000 MCG tablet Take 1,000 mcg by mouth daily.     cyclobenzaprine (FLEXERIL) 10 MG tablet Take 1 tablet (10 mg total) by mouth 3 (three) times daily as needed for muscle spasms. (Patient not taking: Reported on 07/25/2022) 60 tablet 1   rosuvastatin (CRESTOR) 40 MG tablet Take 1 tablet (40 mg total) by mouth 3 (three) times a week for 28 days. Patient needs appointment for future refills. Please call office to schedule appointment @ [phone]. 1st attempt. 12 tablet 0   cefdinir (OMNICEF) 300 MG capsule Take 1 capsule (300 mg total) by mouth 2 (two) times daily. (Patient not taking: Reported on 07/25/2022) 20 capsule 0   Lidocaine 4 % PTCH Place 1 patch onto the skin daily as needed (back pain.). (Patient not taking: Reported on 03/02/2022)     No facility-administered medications prior to visit.    ROS: Review of Systems  Constitutional:  Negative for appetite change, fatigue and unexpected weight change.  HENT:  Negative for congestion, nosebleeds, sneezing, sore throat and trouble swallowing.   Eyes:  Negative for itching and visual disturbance.  Respiratory:  Negative for cough.   Cardiovascular:  Negative for chest pain, palpitations and leg swelling.  Gastrointestinal:  Negative for  abdominal distention, blood in stool, diarrhea and nausea.  Genitourinary:  Negative for frequency and hematuria.  Musculoskeletal:  Negative for back pain, gait problem, joint swelling and neck pain.  Skin:  Negative for rash.  Neurological:  Negative for dizziness, tremors, speech difficulty and weakness.  Psychiatric/Behavioral:  Negative for agitation, dysphoric mood and sleep disturbance. The patient is not nervous/anxious.     Objective:  BP 132/60 (BP Location: Left Arm)   Pulse 64   Temp 97.8 F (36.6 C) (Oral)   Ht '5\' 4"'$  (1.626 m)   Wt 181 lb 6.4 oz (82.3 kg)   SpO2 95%   BMI 31.14 kg/m   BP Readings from Last 3 Encounters:  07/25/22 132/60  03/02/22  140/62  12/01/21 (!) 164/60    Wt Readings from Last 3 Encounters:  07/25/22 181 lb 6.4 oz (82.3 kg)  03/02/22 191 lb (86.6 kg)  12/01/21 191 lb 12.8 oz (87 kg)    Physical Exam Constitutional:      General: He is not in acute distress.    Appearance: He is well-developed.     Comments: NAD  Eyes:     Conjunctiva/sclera: Conjunctivae normal.     Pupils: Pupils are equal, round, and reactive to light.  Neck:     Thyroid: No thyromegaly.     Vascular: No JVD.  Cardiovascular:     Rate and Rhythm: Normal rate and regular rhythm.     Heart sounds: Normal heart sounds. No murmur heard.    No friction rub. No gallop.  Pulmonary:     Effort: Pulmonary effort is normal. No respiratory distress.     Breath sounds: Normal breath sounds. No wheezing or rales.  Chest:     Chest wall: No tenderness.  Abdominal:     General: Bowel sounds are normal. There is no distension.     Palpations: Abdomen is soft. There is no mass.     Tenderness: There is no abdominal tenderness. There is no guarding or rebound.  Musculoskeletal:        General: No tenderness. Normal range of motion.     Cervical back: Normal range of motion.  Lymphadenopathy:     Cervical: No cervical adenopathy.  Skin:    General: Skin is warm and dry.     Findings: No rash.  Neurological:     Mental Status: He is alert and oriented to person, place, and time.     Cranial Nerves: No cranial nerve deficit.     Motor: No abnormal muscle tone.     Coordination: Coordination normal.     Gait: Gait normal.     Deep Tendon Reflexes: Reflexes are normal and symmetric.  Psychiatric:        Behavior: Behavior normal.        Thought Content: Thought content normal.        Judgment: Judgment normal.   LS spine on the right and posterior hip and gluteal areas are tender to palpation.  Right trochanteric bursa is sensitive to palpation Straight leg elevation is negative bilaterally. Hyperkeratotic lesions on the left leg and  left forearm Multiple bruises on the forearms   Procedure Note :     Procedure : Cryosurgery   Indication:  Wart(s)     Risks including unsuccessful procedure , bleeding, infection, bruising, scar, a need for a repeat  procedure and others were explained to the patient in detail as well as the benefits. Informed consent was obtained verbally.  2 lesion(s)  on L arm and 4 on LLE    was/were treated with liquid nitrogen on a Q-tip in a usual fasion . Band-Aid was applied and antibiotic ointment was given for a later use.   Tolerated well. Complications none.   Postprocedure instructions :     Keep the wounds clean. You can wash them with liquid soap and water. Pat dry with gauze or a Kleenex tissue  Before applying antibiotic ointment and a Band-Aid.   You need to report immediately  if  any signs of infection develop.    Lab Results  Component Value Date   WBC 6.6 12/02/2021   HGB 14.4 12/02/2021   HCT 41.9 12/02/2021   PLT 200.0 12/02/2021   GLUCOSE 86 12/02/2021   CHOL 132 10/05/2020   TRIG 158.0 (H) 10/05/2020   HDL 53.70 10/05/2020   LDLDIRECT 148.0 06/07/2018   LDLCALC 46 10/05/2020   ALT 27 12/02/2021   AST 33 12/02/2021   NA 137 12/02/2021   K 3.7 12/02/2021   CL 105 12/02/2021   CREATININE 1.46 12/02/2021   BUN 34 (H) 12/02/2021   CO2 21 12/02/2021   TSH 1.61 12/02/2021   PSA 0.86 10/05/2020   INR 1.10 06/22/2015   HGBA1C 5.7 03/06/2017    PERIPHERAL VASCULAR CATHETERIZATION  Result Date: 11/02/2021 Patient name: MARKCUS LAZENBY MRN: [id number] DOB: 02/26/1951 Sex: male 11/02/2021 Pre-operative Diagnosis: Left leg pain Post-operative diagnosis:  Same Surgeon:  [name] Procedure Performed:  1.  Ultrasound-guided access, left femoral artery  2.  Abdominal aortogram with CO2  3.  Bilateral lower extremity runoff  4.  Conscious sedation, 36 minutes Indications: This is a 71 year old gentleman with multiple prior vascular interventions who has been having severe  left back hip and foot pain.  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  He is here for further evaluation Procedure:  The patient was identified in the holding area and taken to room 8.  The patient was then placed supine on the table and prepped and draped in the usual sterile fashion.  A time out was called.  Conscious sedation was administered with the use of IV fentanyl and Versed under continuous physician and nurse monitoring.  Heart rate, blood pressure, and oxygen saturation were continuously monitored.  Total sedation time was 36 minutes.  Ultrasound was used to evaluate the right common femoral artery.  It was patent .  A digital ultrasound image was acquired.  A micropuncture needle was used to access the right common femoral artery under ultrasound guidance.  An 018 wire was advanced without resistance and a micropuncture sheath was placed.  The 018 wire was removed and a benson wire was placed.  The micropuncture sheath was exchanged for a 5 french sheath.  An omniflush catheter was advanced over the wire to the level of L-1.  An abdominal angiogram with CO2 was obtained.  Next, the cath was pulled out of the aortic bifurcation and pelvic imaging was obtained followed additional CO2 injections to evaluate the leg. Findings:  Aortogram: No significant renal artery stenosis was identified.  The infrarenal abdominal aorta is heavily calcified without significant stenosis.  Stents within the right common and external iliac artery are widely patent.  The stent within the left common and external iliac artery are widely patent.  The left hypogastric artery is occluded.  Right Lower Extremity: There is patulous dilatation of the right common femoral artery consistent with prior endarterectomy with patch angioplasty.  Bypass graft is visualized off of the common femoral artery down to the popliteal artery.  No obvious stenosis was identified.  Left Lower Extremity: The left common femoral artery is  widely patent.  It is consistent with prior patch angioplasty.  Stents within the left superficial femoral artery are patent without hemodynamically significant stenosis however there is some luminal narrowing within the midportion of the superficial femoral artery.  The popliteal artery is widely patent.  Runoff was difficult to visualize secondary to timing of contrast however he does have 3 vessels proximally and the posterior tibial was visualized crossing the ankle. Intervention: None Impression:  #1  The iliac artery of concern on the left visualized on CT scan does not show any significant stenosis.  #2  Patent right femoral endarterectomy and femoral-popliteal bypass graft  #3  Patent left femoral endarterectomy without stenosis.  There is mild narrowing within the proximal to midportion of the left superficial femoral artery stents V. [name], M.D., FACS [facility] Office: [phone] Pager:  [phone]    Assessment & Plan:   Problem List Items Addressed This Visit     Back pain    MSK pain Check urinalysis Blue-Emu cream  Rice sock Prednisone 10 mg: take 4 tabs a day x 3 days; then 3 tabs a day x 4 days; then 2 tabs a day x 4 days, then 1 tab a day x 6 days, then stop. Take pc. Labs w/Dr Marin Olp tomorrow       Relevant Medications   predniSONE (DELTASONE) 10 MG tablet   Bruising    Try Arnica cream      LOW BACK PAIN    MSK pain Blue-Emu cream  Rice sock Prednisone 10 mg: take 4 tabs a day x 3 days; then 3 tabs a day x 4 days; then 2 tabs a day x 4 days, then 1 tab a day x 6 days, then stop. Take pc.       Relevant Medications   predniSONE (DELTASONE) 10 MG tablet   Polycythemia, secondary (Chronic)    OV w/Dr Marin Olp tomorrow w/labs      Warts    See Cryo - legs, arms Skin bx L forearm if relapsed         Meds ordered this encounter  Medications   predniSONE (DELTASONE) 10 MG tablet    Sig: Prednisone 10 mg: take 4  tabs a day x 3 days; then 3 tabs a day x 4 days; then 2 tabs a day x 4 days, then 1 tab a day x 6 days, then stop. Take pc.    Dispense:  38 tablet    Refill:  1      Follow-up: Return in about 3 months (around 10/25/2022).  [name], MD

## 2022-07-25 NOTE — Assessment & Plan Note (Signed)
Try Arnica cream

## 2022-07-26 ENCOUNTER — Other Ambulatory Visit: Payer: Self-pay

## 2022-07-26 ENCOUNTER — Inpatient Hospital Stay (HOSPITAL_BASED_OUTPATIENT_CLINIC_OR_DEPARTMENT_OTHER): Payer: Medicare HMO | Admitting: Family

## 2022-07-26 ENCOUNTER — Inpatient Hospital Stay: Payer: Medicare HMO | Attending: Hematology & Oncology

## 2022-07-26 ENCOUNTER — Encounter: Payer: Self-pay | Admitting: Family

## 2022-07-26 VITALS — BP 115/52 | HR 60 | Temp 97.8°F | Resp 18 | Ht 64.0 in | Wt 184.4 lb

## 2022-07-26 DIAGNOSIS — D751 Secondary polycythemia: Secondary | ICD-10-CM | POA: Diagnosis present

## 2022-07-26 DIAGNOSIS — G629 Polyneuropathy, unspecified: Secondary | ICD-10-CM | POA: Insufficient documentation

## 2022-07-26 DIAGNOSIS — Z7982 Long term (current) use of aspirin: Secondary | ICD-10-CM | POA: Insufficient documentation

## 2022-07-26 DIAGNOSIS — D500 Iron deficiency anemia secondary to blood loss (chronic): Secondary | ICD-10-CM | POA: Diagnosis not present

## 2022-07-26 DIAGNOSIS — G4730 Sleep apnea, unspecified: Secondary | ICD-10-CM | POA: Diagnosis not present

## 2022-07-26 DIAGNOSIS — F17200 Nicotine dependence, unspecified, uncomplicated: Secondary | ICD-10-CM | POA: Diagnosis not present

## 2022-07-26 LAB — CBC WITH DIFFERENTIAL (CANCER CENTER ONLY)
Abs Immature Granulocytes: 0.04 10*3/uL (ref 0.00–0.07)
Basophils Absolute: 0.1 10*3/uL (ref 0.0–0.1)
Basophils Relative: 1 %
Eosinophils Absolute: 0.5 10*3/uL (ref 0.0–0.5)
Eosinophils Relative: 5 %
HCT: 42.7 % (ref 39.0–52.0)
Hemoglobin: 14.7 g/dL (ref 13.0–17.0)
Immature Granulocytes: 0 %
Lymphocytes Relative: 33 %
Lymphs Abs: 3.5 10*3/uL (ref 0.7–4.0)
MCH: 34.2 pg — ABNORMAL HIGH (ref 26.0–34.0)
MCHC: 34.4 g/dL (ref 30.0–36.0)
MCV: 99.3 fL (ref 80.0–100.0)
Monocytes Absolute: 0.8 10*3/uL (ref 0.1–1.0)
Monocytes Relative: 8 %
Neutro Abs: 5.7 10*3/uL (ref 1.7–7.7)
Neutrophils Relative %: 53 %
Platelet Count: 229 10*3/uL (ref 150–400)
RBC: 4.3 MIL/uL (ref 4.22–5.81)
RDW: 13.3 % (ref 11.5–15.5)
WBC Count: 10.7 10*3/uL — ABNORMAL HIGH (ref 4.0–10.5)
nRBC: 0 % (ref 0.0–0.2)

## 2022-07-26 LAB — CMP (CANCER CENTER ONLY)
ALT: 13 U/L (ref 0–44)
AST: 15 U/L (ref 15–41)
Albumin: 4.3 g/dL (ref 3.5–5.0)
Alkaline Phosphatase: 58 U/L (ref 38–126)
Anion gap: 9 (ref 5–15)
BUN: 47 mg/dL — ABNORMAL HIGH (ref 8–23)
CO2: 24 mmol/L (ref 22–32)
Calcium: 9.6 mg/dL (ref 8.9–10.3)
Chloride: 104 mmol/L (ref 98–111)
Creatinine: 2 mg/dL — ABNORMAL HIGH (ref 0.61–1.24)
GFR, Estimated: 35 mL/min — ABNORMAL LOW (ref >60)
Glucose, Bld: 134 mg/dL — ABNORMAL HIGH (ref 70–99)
Potassium: 3.9 mmol/L (ref 3.5–5.1)
Sodium: 137 mmol/L (ref 135–145)
Total Bilirubin: 0.6 mg/dL (ref 0.3–1.2)
Total Protein: 7.4 g/dL (ref 6.5–8.1)

## 2022-07-26 LAB — IRON AND IRON BINDING CAPACITY (CC-WL,HP ONLY)
Iron: 86 ug/dL (ref 45–182)
Saturation Ratios: 29 % (ref 17.9–39.5)
TIBC: 295 ug/dL (ref 250–450)
UIBC: 209 ug/dL (ref 117–376)

## 2022-07-26 LAB — FERRITIN: Ferritin: 220 ng/mL (ref 24–336)

## 2022-07-26 NOTE — Progress Notes (Signed)
Hematology and Oncology Follow Up Visit  DAYVION SANS [id number] Nov 02, 1951 71 y.o. 07/26/2022   Principle Diagnosis:  Secondary polycythemia, due to smoking/sleep apnea   Current Therapy:        Phlebotomy to keep Hct < 45% Aspirin 81 mg PO daily   Interim History:  [name] is here today for follow-up. We last saw him a little over a year ago in July 2022. He is doing quite well and has no complaints at this time.  Hct remains stable at 42.7%.  He quite smoking years ago.  No fever, chills, n/v, cough, rash, dizziness, SOB, chest pain, palpitations, abdominal pain or changes in bowel or bladder habits.  No bruising or petechiae. He has not been taking his daily baby aspirin regularly but will restart.  No swelling or tenderness in his extremities. 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  Appetite comes and goes. He admits that hydration needs to be better through out the day. Weight is stable at 184 lbs.   ECOG Performance Status: 1 - Symptomatic but completely ambulatory  Medications:  Allergies as of 07/26/2022       Reactions   Roxicodone [oxycodone Hcl] Other (See Comments)   hallucinations   Hydralazine    Skin lupus type reaction   Hydrocodone Hives   Oxycodone Hives   Benazepril Cough   Gabapentin Rash   Itraconazole Nausea Only, Rash   Lipitor [atorvastatin Calcium]    cramps        Medication List        Accurate as of July 26, 2022  9:43 AM. If you have any questions, ask your nurse or doctor.          acetaminophen 650 MG CR tablet Commonly known as: TYLENOL Take 1,300 mg by mouth every 8 (eight) hours as needed for pain.   aspirin EC 81 MG tablet Take 81 mg by mouth every other day. Swallow whole.   cholecalciferol 1000 units tablet Commonly known as: VITAMIN D Take 1,000 Units by mouth daily.   clonazePAM 0.5 MG tablet Commonly known as: KLONOPIN TAKE ONE TABLET BY MOUTH TWICE DAILY AS NEEDED FOR ANXIETY   cyanocobalamin  1000 MCG tablet Commonly known as: VITAMIN B12 Take 1,000 mcg by mouth daily.   cyclobenzaprine 10 MG tablet Commonly known as: FLEXERIL Take 1 tablet (10 mg total) by mouth 3 (three) times daily as needed for muscle spasms.   Fluzone High-Dose Quadrivalent 0.7 ML Susy Generic drug: Influenza Vac High-Dose Quad   losartan 50 MG tablet Commonly known as: COZAAR Take 1 tablet (Total 50 mg) in the morning (AM) and 1/2 (25 mg) tablet in the evening (PM). Please make overdue appt with Dr. Burt Knack. Thank you 1st attempt   metoprolol succinate 50 MG 24 hr tablet Commonly known as: TOPROL-XL Take 1 tablet (50 mg total) by mouth daily. Please schedule appt for future refills. 1st attempt   predniSONE 10 MG tablet Commonly known as: DELTASONE Prednisone 10 mg: take 4 tabs a day x 3 days; then 3 tabs a day x 4 days; then 2 tabs a day x 4 days, then 1 tab a day x 6 days, then stop. Take pc.   rosuvastatin 40 MG tablet Commonly known as: CRESTOR Take 1 tablet (40 mg total) by mouth 3 (three) times a week for 28 days. Patient needs appointment for future refills. Please call office to schedule appointment @ [phone]. 1st attempt.   tadalafil 20 MG tablet Commonly known  as: CIALIS Take 1 tablet (20 mg total) by mouth every three (3) days as needed for erectile dysfunction.   traMADol 50 MG tablet Commonly known as: ULTRAM Take 1 tablet (50 mg total) by mouth every 6 (six) hours as needed.   triamterene-hydrochlorothiazide 37.5-25 MG tablet Commonly known as: MAXZIDE-25 Take 2 tablets by mouth daily. Annual appt due in Nov must see provider for future refills   VITAMIN C PO Take 1 tablet by mouth in the morning.        Allergies:  Allergies  Allergen Reactions   Roxicodone [Oxycodone Hcl] Other (See Comments)    hallucinations   Hydralazine     Skin lupus type reaction   Hydrocodone Hives   Oxycodone Hives   Benazepril Cough   Gabapentin Rash   Itraconazole Nausea Only and  Rash   Lipitor [Atorvastatin Calcium]     cramps    Past Medical History, Surgical history, Social history, and Family History were reviewed and updated.  Review of Systems: All other 10 point review of systems is negative.   Physical Exam:  vitals were not taken for this visit.   Wt Readings from Last 3 Encounters:  07/25/22 181 lb 6.4 oz (82.3 kg)  03/02/22 191 lb (86.6 kg)  12/01/21 191 lb 12.8 oz (87 kg)    Ocular: Sclerae unicteric, pupils equal, round and reactive to light Ear-nose-throat: Oropharynx clear, dentition fair Lymphatic: No cervical or supraclavicular adenopathy Lungs no rales or rhonchi, good excursion bilaterally Heart regular rate and rhythm, no murmur appreciated Abd soft, nontender, positive bowel sounds MSK no focal spinal tenderness, no joint edema Neuro: non-focal, well-oriented, appropriate affect Breasts: Deferred  Lab Results  Component Value Date   WBC 10.7 (H) 07/26/2022   HGB 14.7 07/26/2022   HCT 42.7 07/26/2022   MCV 99.3 07/26/2022   PLT 229 07/26/2022   Lab Results  Component Value Date   FERRITIN 142 12/02/2021   IRON 80 12/02/2021   TIBC 326 12/02/2021   UIBC 234 06/25/2021   IRONPCTSAT 25 12/02/2021   Lab Results  Component Value Date   RETICCTPCT 2.1 (H) 07/13/2018   RBC 4.30 07/26/2022   No results found for: "KPAFRELGTCHN", "LAMBDASER", "KAPLAMBRATIO" Lab Results  Component Value Date   IGGSERUM 858 05/01/2008   IGMSERUM 134 05/01/2008   No results found for: "TOTALPROTELP", "ALBUMINELP", "A1GS", "A2GS", "BETS", "BETA2SER", "GAMS", "MSPIKE", "SPEI"   Chemistry      Component Value Date/Time   NA 137 12/02/2021 1212   NA 136 05/29/2020 1233   K 3.7 12/02/2021 1212   CL 105 12/02/2021 1212   CO2 21 12/02/2021 1212   BUN 34 (H) 12/02/2021 1212   BUN 28 (H) 05/29/2020 1233   CREATININE 1.46 12/02/2021 1212   CREATININE 1.41 (H) 06/25/2021 1335      Component Value Date/Time   CALCIUM 9.4 12/02/2021 1212    ALKPHOS 50 12/02/2021 1212   AST 33 12/02/2021 1212   AST 24 06/25/2021 1335   ALT 27 12/02/2021 1212   ALT 25 06/25/2021 1335   BILITOT 0.4 12/02/2021 1212   BILITOT 0.6 06/25/2021 1335       Impression and Plan: Mr. Rideout is a very pleasant 71 yo caucasian with secondary polycythemia.  No phlebotomy needed, Hct 42.7%.  Continue daily aspirin.  Follow-up in 1 year.   Lottie Dawson, NP 8/29/20239:43 AM

## 2022-07-28 ENCOUNTER — Telehealth: Payer: Self-pay | Admitting: *Deleted

## 2022-07-28 NOTE — Progress Notes (Signed)
Remote pacemaker transmission.   

## 2022-07-28 NOTE — Telephone Encounter (Signed)
Per 07/26/22 los - called and lvm of upcoming appointments - requested callback to confirm

## 2022-08-09 ENCOUNTER — Other Ambulatory Visit: Payer: Self-pay

## 2022-08-09 ENCOUNTER — Other Ambulatory Visit: Payer: Self-pay | Admitting: Cardiovascular Disease

## 2022-08-09 ENCOUNTER — Other Ambulatory Visit: Payer: Self-pay | Admitting: Internal Medicine

## 2022-08-09 MED ORDER — ROSUVASTATIN CALCIUM 40 MG PO TABS: 40.0000 mg | ORAL_TABLET | ORAL | 0 refills | Status: DC

## 2022-08-11 ENCOUNTER — Telehealth: Payer: Medicare HMO | Admitting: Physician Assistant

## 2022-08-11 DIAGNOSIS — U071 COVID-19: Secondary | ICD-10-CM | POA: Diagnosis not present

## 2022-08-11 MED ORDER — BENZONATATE 100 MG PO CAPS: 100.0000 mg | ORAL_CAPSULE | Freq: Three times a day (TID) | ORAL | 0 refills | Status: DC | PRN

## 2022-08-11 MED ORDER — MOLNUPIRAVIR EUA 200MG CAPSULE: 4.0000 | ORAL_CAPSULE | Freq: Two times a day (BID) | ORAL | 0 refills | Status: AC

## 2022-08-11 NOTE — Patient Instructions (Signed)
Douglas Christensen, thank you for joining Leeanne Rio, PA-C for today's virtual visit.  While this provider is not your primary care provider (PCP), if your PCP is located in our provider database this encounter information will be shared with them immediately following your visit.  Consent: (Patient) Douglas Christensen provided verbal consent for this virtual visit at the beginning of the encounter.  Current Medications:  Current Outpatient Medications:    acetaminophen (TYLENOL) 650 MG CR tablet, Take 1,300 mg by mouth every 8 (eight) hours as needed for pain., Disp: , Rfl:    Ascorbic Acid (VITAMIN C PO), Take 1 tablet by mouth in the morning., Disp: , Rfl:    aspirin EC 81 MG tablet, Take 81 mg by mouth every other day. Swallow whole., Disp: , Rfl:    cholecalciferol (VITAMIN D) 1000 units tablet, Take 1,000 Units by mouth daily. , Disp: , Rfl:    clonazePAM (KLONOPIN) 0.5 MG tablet, TAKE ONE TABLET BY MOUTH TWICE DAILY AS NEEDED FOR ANXIETY, Disp: 60 tablet, Rfl: 3   cyclobenzaprine (FLEXERIL) 10 MG tablet, Take 1 tablet (10 mg total) by mouth 3 (three) times daily as needed for muscle spasms., Disp: 60 tablet, Rfl: 1   losartan (COZAAR) 50 MG tablet, Take 1.5 tablets (75 mg total) by mouth as directed. TAKE ONE TABLET BY MOUTH EVERY MORNING, AND TAKE 1/2 TABLET EVERY IN THE EVENING Please call to schedule an overdue appointment with Dr. Burt Knack for refills, [phone], thank you. 2ND attempt., Disp: 45 tablet, Rfl: 0   metoprolol succinate (TOPROL-XL) 50 MG 24 hr tablet, Take 1 tablet (50 mg total) by mouth daily. Please call to schedule an overdue appointment with Dr. Burt Knack for refills, [phone], thank you. 2ND attempt., Disp: 30 tablet, Rfl: 0   predniSONE (DELTASONE) 10 MG tablet, Prednisone 10 mg: take 4 tabs a day x 3 days; then 3 tabs a day x 4 days; then 2 tabs a day x 4 days, then 1 tab a day x 6 days, then stop. Take pc., Disp: 38 tablet, Rfl: 1   rosuvastatin (CRESTOR) 40 MG  tablet, Take 1 tablet (40 mg total) by mouth 3 (three) times a week. Please call to schedule an overdue appointment with Dr. Burt Knack for refills, [phone], thank you. 2ND attempt., Disp: 12 tablet, Rfl: 0   tadalafil (CIALIS) 20 MG tablet, Take 1 tablet (20 mg total) by mouth every three (3) days as needed for erectile dysfunction., Disp: 30 tablet, Rfl: 0   traMADol (ULTRAM) 50 MG tablet, Take 1 tablet (50 mg total) by mouth every 6 (six) hours as needed., Disp: 120 tablet, Rfl: 5   triamterene-hydrochlorothiazide (MAXZIDE-25) 37.5-25 MG tablet, Take 2 tablets by mouth daily. Annual appt due in Nov must see provider for future refills, Disp: 60 tablet, Rfl: 5   vitamin B-12 (CYANOCOBALAMIN) 1000 MCG tablet, Take 1,000 mcg by mouth daily., Disp: , Rfl:    Medications ordered in this encounter:  No orders of the defined types were placed in this encounter.    *If you need refills on other medications prior to your next appointment, please contact your pharmacy*  Follow-Up: Call back or seek an in-person evaluation if the symptoms worsen or if the condition fails to improve as anticipated.  Other Instructions Please keep well-hydrated and get plenty of rest. Start a saline nasal rinse to flush out your nasal passages. You can use plain Mucinex to help thin congestion. If you have a humidifier, running in the  bedroom at night. I want you to start OTC vitamin D3 1000 units daily, vitamin C 1000 mg daily, and a zinc supplement. Please take prescribed medications as directed.  You have been enrolled in a MyChart symptom monitoring program. Please answer these questions daily so we can keep track of how you are doing.  You were to quarantine for 5 days from onset of your symptoms.  After day 5, if you have had no fever and you are feeling better, you can end quarantine but need to mask for an additional 5 days. After day 5 if you have a fever or are having significant symptoms, please  quarantine for full 10 days.  If you note any worsening of symptoms, any significant shortness of breath or any chest pain, please seek ER evaluation ASAP.  Please do not delay care!  COVID-19: What to Do if You Are Sick If you test positive and are an older adult or someone who is at high risk of getting very sick from COVID-19, treatment may be available. Contact a healthcare provider right away after a positive test to determine if you are eligible, even if your symptoms are mild right now. You can also visit a Test to Treat location and, if eligible, receive a prescription from a provider. Don't delay: Treatment must be started within the first few days to be effective. If you have a fever, cough, or other symptoms, you might have COVID-19. Most people have mild illness and are able to recover at home. If you are sick: Keep track of your symptoms. If you have an emergency warning sign (including trouble breathing), call 911. Steps to help prevent the spread of COVID-19 if you are sick If you are sick with COVID-19 or think you might have COVID-19, follow the steps below to care for yourself and to help protect other people in your home and community. Stay home except to get medical care Stay home. Most people with COVID-19 have mild illness and can recover at home without medical care. Do not leave your home, except to get medical care. Do not visit public areas and do not go to places where you are unable to wear a mask. Take care of yourself. Get rest and stay hydrated. Take over-the-counter medicines, such as acetaminophen, to help you feel better. Stay in touch with your doctor. Call before you get medical care. Be sure to get care if you have trouble breathing, or have any other emergency warning signs, or if you think it is an emergency. Avoid public transportation, ride-sharing, or taxis if possible. Get tested If you have symptoms of COVID-19, get tested. While waiting for test results,  stay away from others, including staying apart from those living in your household. Get tested as soon as possible after your symptoms start. Treatments may be available for people with COVID-19 who are at risk for becoming very sick. Don't delay: Treatment must be started early to be effective--some treatments must begin within 5 days of your first symptoms. Contact your healthcare provider right away if your test result is positive to determine if you are eligible. Self-tests are one of several options for testing for the virus that causes COVID-19 and may be more convenient than laboratory-based tests and point-of-care tests. Ask your healthcare provider or your local health department if you need help interpreting your test results. You can visit your state, tribal, local, and territorial health department's website to look for the latest local information on testing sites. Separate  yourself from other people As much as possible, stay in a specific room and away from other people and pets in your home. If possible, you should use a separate bathroom. If you need to be around other people or animals in or outside of the home, wear a well-fitting mask. Tell your close contacts that they may have been exposed to COVID-19. An infected person can spread COVID-19 starting 48 hours (or 2 days) before the person has any symptoms or tests positive. By letting your close contacts know they may have been exposed to COVID-19, you are helping to protect everyone. See COVID-19 and Animals if you have questions about pets. If you are diagnosed with COVID-19, someone from the health department may call you. Answer the call to slow the spread. Monitor your symptoms Symptoms of COVID-19 include fever, cough, or other symptoms. Follow care instructions from your healthcare provider and local health department. Your local health authorities may give instructions on checking your symptoms and reporting information. When  to seek emergency medical attention Look for emergency warning signs* for COVID-19. If someone is showing any of these signs, seek emergency medical care immediately: Trouble breathing Persistent pain or pressure in the chest New confusion Inability to wake or stay awake Pale, gray, or blue-colored skin, lips, or nail beds, depending on skin tone *This list is not all possible symptoms. Please call your medical provider for any other symptoms that are severe or concerning to you. Call 911 or call ahead to your local emergency facility: Notify the operator that you are seeking care for someone who has or may have COVID-19. Call ahead before visiting your doctor Call ahead. Many medical visits for routine care are being postponed or done by phone or telemedicine. If you have a medical appointment that cannot be postponed, call your doctor's office, and tell them you have or may have COVID-19. This will help the office protect themselves and other patients. If you are sick, wear a well-fitting mask You should wear a mask if you must be around other people or animals, including pets (even at home). Wear a mask with the best fit, protection, and comfort for you. You don't need to wear the mask if you are alone. If you can't put on a mask (because of trouble breathing, for example), cover your coughs and sneezes in some other way. Try to stay at least 6 feet away from other people. This will help protect the people around you. Masks should not be placed on young children under age 46 years, anyone who has trouble breathing, or anyone who is not able to remove the mask without help. Cover your coughs and sneezes Cover your mouth and nose with a tissue when you cough or sneeze. Throw away used tissues in a lined trash can. Immediately wash your hands with soap and water for at least 20 seconds. If soap and water are not available, clean your hands with an alcohol-based hand sanitizer that contains at  least 60% alcohol. Clean your hands often Wash your hands often with soap and water for at least 20 seconds. This is especially important after blowing your nose, coughing, or sneezing; going to the bathroom; and before eating or preparing food. Use hand sanitizer if soap and water are not available. Use an alcohol-based hand sanitizer with at least 60% alcohol, covering all surfaces of your hands and rubbing them together until they feel dry. Soap and water are the best option, especially if hands are visibly dirty.  Avoid touching your eyes, nose, and mouth with unwashed hands. Handwashing Tips Avoid sharing personal household items Do not share dishes, drinking glasses, cups, eating utensils, towels, or bedding with other people in your home. Wash these items thoroughly after using them with soap and water or put in the dishwasher. Clean surfaces in your home regularly Clean and disinfect high-touch surfaces (for example, doorknobs, tables, handles, light switches, and countertops) in your "sick room" and bathroom. In shared spaces, you should clean and disinfect surfaces and items after each use by the person who is ill. If you are sick and cannot clean, a caregiver or other person should only clean and disinfect the area around you (such as your bedroom and bathroom) on an as needed basis. Your caregiver/other person should wait as long as possible (at least several hours) and wear a mask before entering, cleaning, and disinfecting shared spaces that you use. Clean and disinfect areas that may have blood, stool, or body fluids on them. Use household cleaners and disinfectants. Clean visible dirty surfaces with household cleaners containing soap or detergent. Then, use a household disinfectant. Use a product from [employer] List N: Disinfectants for Coronavirus (OHYWV-37). Be sure to follow the instructions on the label to ensure safe and effective use of the product. Many products recommend keeping  the surface wet with a disinfectant for a certain period of time (look at "contact time" on the product label). You may also need to wear personal protective equipment, such as gloves, depending on the directions on the product label. Immediately after disinfecting, wash your hands with soap and water for 20 seconds. For completed guidance on cleaning and disinfecting your home, visit Complete Disinfection Guidance. Take steps to improve ventilation at home Improve ventilation (air flow) at home to help prevent from spreading COVID-19 to other people in your household. Clear out COVID-19 virus particles in the air by opening windows, using air filters, and turning on fans in your home. Use this interactive tool to learn how to improve air flow in your home. When you can be around others after being sick with COVID-19 Deciding when you can be around others is different for different situations. Find out when you can safely end home isolation. For any additional questions about your care, contact your healthcare provider or state or local health department. 02/16/2021 Content source: [facility] Division for Immunization and Respiratory Diseases ([facility]), Division of Viral Diseases This information is not intended to replace advice given to you by your health care provider. Make sure you discuss any questions you have with your health care provider. Document Revised: 04/01/2021 Document Reviewed: 04/01/2021 Elsevier Patient Education  2022 [employer].      If you have been instructed to have an in-person evaluation today at a local Urgent Care facility, please use the link below. It will take you to a list of all of our available [facility], including address, phone number and hours of operation. Please do not delay care.  [facility]  If you or a family member do not have a primary care provider, use the link below to schedule a visit and establish care. When you choose  a [facility] primary care physician or advanced practice provider, you gain a long-term partner in health. Find a Primary Care Provider  Learn more about [facility]'s in-office and virtual care options: [facility]
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
## 2022-08-22 ENCOUNTER — Ambulatory Visit: Payer: Medicare HMO | Admitting: Internal Medicine

## 2022-08-23 NOTE — Progress Notes (Unsigned)
Cardiology Office Note:    Date:  08/24/2022   ID:  Douglas Christensen, DOB 1951-01-28, MRN [id number]  PCP:  Cassandria Anger, MD  [facility] Cardiologist:  Sherren Mocha, MD Electrophysiologist:  Thompson Grayer, MD    Referring MD: Volanda Napoleon, MD   Chief Complaint:  F/u for CAD    Patient Profile: CAD s/p CABG in 04/2013 Myoview 7/20: EF 60, inf scar w/ peri-infarct ischemia; unchanged from 2019 PAD  s/p R SFA stent and L CIA stent in 2008 L EIA stent 2012 L EIA/CFA/PFA/SFA endarterectomy R fem-pop 2014 L SFA stent in 3/16 S/p stent to R CIA, R EIA, R pop in 07/2019 Carotid artery stenosis [place] 9/21: bilat ICA 40-59  R subclavian stenosis L VA occluded  2nd degree heart block type 1 2-1 heart block in 06/2017 >> resolved with stopping beta-blocker  S/p pacemaker in 2021  ??Abdominal aortic aneurysm (Lumbar MRI in 2020: 3.2 cm) [place] 07/2019: no AAA Aortic atherosclerosis  Hypertension   Hyperlipidemia  Chronic kidney disease  OSA GERD,  prior ETOH abuse Secondary polycythemia (smoking) - followed by Dr. Marin Olp  Prior CV Studies:   Carotid US 08/07/20 Bilat ICA 40-59; L VA occluded; R subclavian stenosis   Echocardiogram 04/01/20 EF 55-60, no RWMA, mod LVH, trivial MR, mild AV sclerosis w/o AS, asc Ao 38 mm   Myoview 06/04/2019 Inf scar with mild to mod peri-infarct ischemia; EF 60; intermediate risk   Holter 01/10/18 1.  The basic rhythm is normal sinus with an average heart rate of 72 bpm 2.  There are frequent PVCs (5% burden) 3.  There is second-degree type I AV block and periods of 2: 1 heart block   Echo 01/22/18 Mild LVH, EF 25-85, grade 2 diastolic dysfunction, mild RAE   Nuclear stress test 01/10/18 Inferior/inferolateral/apical inferior defect with minimal reversibility and anteroseptal wall-likely diaphragmatic attenuation; cannot rule out infarct with peri-infarct ischemia; sinus rhythm with PVCs, PACs and intermittent type I  second-degree AV block which improved with increased heart rate; intermediate risk   LHC (6/14):   prox and mid LM 40-50%, prox LAD 70%, ostial Dx 75%, mid LAD 90-95%, prox CFX 60-70%, mid RCA occluded, EF 55-65% >>> CABG   History of Present Illness:   Douglas Christensen is a 71 y.o. male with the above problem list.  He was last seen via Telemedicine in Jan 2022.  He returns for f/u. He is here alone. He now lives at [facility]. He still has all his providers here in [place].  He had COVID 3 weeks ago.  He was treated with antivirals.  Symptoms have improved.  However, he still has had congestion.  He has noted increasing shortness of breath with exertion over the past several months.  He has not had chest pain.  Of note, his anginal equivalent prior to his bypass with shortness of breath.  He underwent stress testing in 2020 for shortness of breath.  This demonstrated no ischemia.  He has been treated medically since.  His electrocardiogram today demonstrates V pacing with frequent PVCs.  He has not had syncope, orthopnea, significant leg edema.      Past Medical History:  Diagnosis Date   Anxiety    Arthritis    CAD (coronary artery disease)    Mild plaque (cath "years ago"); abnormal Myoview 04/2013 with subsequent CABG x 5 with LIMA to LAD, SVG to OM1, SVG to DX, SVG to PD & PL.  Carotid artery disease ([place])    Carotid US 07/2019 bilateral ICA 40-59; bilateral subclavian stenosis // Carotid US 9/21: Bilateral ICA 40-59; L VA occluded; R subclavian stenosis   Cataract    GERD (gastroesophageal reflux disease)    History of colonic polyps    History of echocardiogram    Echo 2/19: Mild LVH, EF 60-65, normal wall motion, grade 2 diastolic dysfunction, mild RAE   History of nuclear stress test    Myoview 2/19: inf/inf-lat/apical inf/ant-sept defect (?diaph atten - cannot rule out peri-infarct ischemia), PVCs/PACs/Mobitz 1 // Myoview 05/2019: EF 60, inf infarct with mild peri-infarct ischemia; no  significant change when compared to prior study; Intermediate Risk   Hx of echocardiogram    Echo (9/15):  EF 55-60%; Gr 2 DD, mild BAE   Hyperlipidemia    Hypertension    LBP (low back pain)    Meralgia paresthetica of left side 2011   Polycythemia, secondary 02/03/2020   PVD (peripheral vascular disease) ([place])    Stent to left common femoral and right superficial femoral.  2008.  50%  left renal    Second degree AV block, Mobitz type I    Holter 2/19: Sinus rhythm, average heart rate 72, frequent PVCs (burden 5%), second-degree type I AV block and periods of 2:1 heart block >> continue clinical managment and avoid AVN blocking agents   Shortness of breath    "once in awhile; can happen at anytime" (08/26/2013)   Sleep apnea    mod OSA, central sleep apnea/hypoapnea syndrome 11/22/12, CPAP every night    Subclavian artery stenosis ([place])    Carotid US 07/2019 bilateral ICA 40-59; bilateral subclavian stenosis   Vitamin D deficiency    Current Medications: Current Meds  Medication Sig   acetaminophen (TYLENOL) 650 MG CR tablet Take 1,300 mg by mouth every 8 (eight) hours as needed for pain.   Ascorbic Acid (VITAMIN C PO) Take 1 tablet by mouth in the morning.   aspirin EC 81 MG tablet Take 81 mg by mouth every other day. Swallow whole.   benzonatate (TESSALON) 100 MG capsule Take 1 capsule (100 mg total) by mouth 3 (three) times daily as needed for cough.   cholecalciferol (VITAMIN D) 1000 units tablet Take 1,000 Units by mouth daily.    clonazePAM (KLONOPIN) 0.5 MG tablet TAKE ONE TABLET BY MOUTH TWICE DAILY AS NEEDED FOR ANXIETY   cyclobenzaprine (FLEXERIL) 10 MG tablet Take 1 tablet (10 mg total) by mouth 3 (three) times daily as needed for muscle spasms.   metoprolol succinate (TOPROL-XL) 50 MG 24 hr tablet Take 1 tablet (50 mg total) by mouth in the morning and at bedtime. Take with or immediately following a meal.   tadalafil (CIALIS) 20 MG tablet Take 1 tablet (20 mg total) by mouth  every three (3) days as needed for erectile dysfunction.   traMADol (ULTRAM) 50 MG tablet Take 1 tablet (50 mg total) by mouth every 6 (six) hours as needed.   vitamin B-12 (CYANOCOBALAMIN) 1000 MCG tablet Take 1,000 mcg by mouth daily.   [DISCONTINUED] losartan (COZAAR) 50 MG tablet Take 1.5 tablets (75 mg total) by mouth as directed. TAKE ONE TABLET BY MOUTH EVERY MORNING, AND TAKE 1/2 TABLET EVERY IN THE EVENING Please call to schedule an overdue appointment with Dr. Burt Knack for refills, [phone], thank you. 2ND attempt.   [DISCONTINUED] metoprolol succinate (TOPROL-XL) 50 MG 24 hr tablet Take 1 tablet (50 mg total) by mouth daily. Please call to schedule an  overdue appointment with Dr. Burt Knack for refills, [phone], thank you. 2ND attempt.   [DISCONTINUED] rosuvastatin (CRESTOR) 40 MG tablet Take 1 tablet (40 mg total) by mouth 3 (three) times a week. Please call to schedule an overdue appointment with Dr. Burt Knack for refills, [phone], thank you. 2ND attempt.   [DISCONTINUED] triamterene-hydrochlorothiazide (MAXZIDE-25) 37.5-25 MG tablet Take 2 tablets by mouth daily. Annual appt due in Nov must see provider for future refills    Allergies:   Roxicodone [oxycodone hcl], Benazepril, Gabapentin, Hydralazine, Hydrocodone, Itraconazole, Lipitor [atorvastatin calcium], and Oxycodone   Social History   Tobacco Use   Smoking status: Former    Packs/day: 2.00    Years: 47.00    Total pack years: 94.00    Types: Cigarettes    Quit date: 12/17/2012    Years since quitting: 9.6   Smokeless tobacco: Never  Vaping Use   Vaping Use: Never used  Substance Use Topics   Alcohol use: Yes    Alcohol/week: 4.0 standard drinks of alcohol    Types: 4 Shots of liquor per week    Comment: 08/26/2013 "3-4 mixed drinks/wk"   Drug use: No    Family Hx: The patient's family history includes Alzheimer's disease in his father; Cancer in his father and mother; Heart disease in his mother; Hyperlipidemia  in his mother; Hypertension in his mother. There is no history of Colon cancer, Heart attack, Esophageal cancer, Liver cancer, Rectal cancer, Stomach cancer, or Pancreatic cancer.  Review of Systems  Constitutional: Negative for fever.  HENT:  Positive for congestion.   Respiratory:  Negative for cough.   Gastrointestinal:  Negative for hematochezia.  Genitourinary:  Negative for hematuria.     EKGs/Labs/Other Test Reviewed:    EKG:  EKG is  ordered today.  The ekg ordered today demonstrates underlying NSR with V paced rhythm and frequent PVCs, in a bigeminal pattern  Recent Labs: 12/02/2021: TSH 1.61 07/26/2022: ALT 13; BUN 47; Creatinine 2.00; Hemoglobin 14.7; Platelet Count 229; Potassium 3.9; Sodium 137   Recent Lipid Panel No results for input(s): "CHOL", "TRIG", "HDL", "VLDL", "LDLCALC", "LDLDIRECT" in the last 8760 hours.   Risk Assessment/Calculations/Metrics:              Physical Exam:    VS:  BP 122/84   Pulse (!) 105   Ht 5' 4.5" (1.638 m)   Wt 178 lb 3.2 oz (80.8 kg)   SpO2 96%   BMI 30.12 kg/m     Wt Readings from Last 3 Encounters:  08/24/22 178 lb 3.2 oz (80.8 kg)  07/26/22 184 lb 6.4 oz (83.6 kg)  07/25/22 181 lb 6.4 oz (82.3 kg)    Constitutional:      Appearance: Healthy appearance. Not in distress.  Neck:     Vascular: JVD normal.  Pulmonary:     Effort: Pulmonary effort is normal.     Breath sounds: No wheezing. No rales.  Cardiovascular:     Normal rate. Irregular rhythm. Normal S1. Normal S2.      Murmurs: There is no murmur.  Edema:    Peripheral edema absent.  Abdominal:     Palpations: Abdomen is soft.  Skin:    General: Skin is warm and dry.  Neurological:     General: No focal deficit present.     Mental Status: Alert and oriented to person, place and time.         ASSESSMENT & PLAN:   Dyspnea He notes shortness of breath with mild to  mod activity over the past 5 mos. He does have a lot of back and hip pain. His anginal equiv  prior to his CABG was shortness of breath. He has not had chest pain. He has not had a NST since 2020. He has a lot of PVCs on his EKG today. He does not have any signs or symptoms of CHF. He is an ex smoker. His PFTs in 2021 showed mild restriction and mildly reduced DLCO.  Echocardiogram  Lexiscan Myoview BMET, BNP today F/u in 8 weeks  CAD (coronary artery disease) S/p CABG in 2014.  Myoview in 05/2019 with inf scar with peri-infarct ischemia. He is having dyspnea on exertion over the past 5 mos. Question if this could be an anginal equiv vs multifactorial dyspnea. He has not had chest pain. Obtain Myoview as noted. Increase Toprol to 50 mg twice daily. Continue ASA 81 mg once daily, Crestor 40 mg 3 x a week. F/u in 8 weeks.   Hyperlipidemia LDL goal <70 Continue Crestor 40 mg 3 x a week. Recent LFTs normal. Obtain direct LDL today.   PVC's (premature ventricular contractions) He has a lot of PVCs on his EKG today. I did check with device clinic. They cannot get a PVC burden from his device. Obtain BMET, Mg2+ today. Obtain 3 day Zio XT. Increase Toprol Xl to 50 mg twice daily. F/u in 8 weeks.   Carotid artery disease ([place]) Bilateral ICA stenosis 40-59% on [place] in 2021. He will need f/u arranged at next visit.   Atherosclerosis of native arteries of the extremities with intermittent claudication F/u with [facility] as planned.   Pacemaker F/u with EP as planned.   Stage 3b chronic kidney disease ([place]) Will try to avoid cardiac catheterization if at all possible to protect his kidneys. Obtain BMET today.  Essential hypertension BP controlled. Increase Toprol for anti anginal Rx and to control PVCs. Continue Losartan 50 mg in A and 25 mg in P, Maxzide 37.5/25 mg 2 tabs daily.          Shared Decision Making/Informed Consent The risks [chest pain, shortness of breath, cardiac arrhythmias, dizziness, blood pressure fluctuations, myocardial infarction, stroke/transient ischemic attack, nausea,  vomiting, allergic reaction, radiation exposure, metallic taste sensation and life-threatening complications (estimated to be 1 in 10,000)], benefits (risk stratification, diagnosing coronary artery disease, treatment guidance) and alternatives of a nuclear stress test were discussed in detail with Mr. Caruth and he agrees to proceed.   Dispo:  Return in about 8 weeks (around 10/19/2022) for Follow up after testing, w/ Dr. Burt Knack, or Richardson Dopp, PA-C.   Medication Adjustments/Labs and Tests Ordered: Current medicines are reviewed at length with the patient today.  Concerns regarding medicines are outlined above.  Tests Ordered: Orders Placed This Encounter  Procedures   Basic metabolic panel   Pro b natriuretic peptide (BNP)   Direct LDL   Magnesium   Cardiac Stress Test: Informed Consent Details: Physician/Practitioner Attestation; Transcribe to consent form and obtain patient signature   MYOCARDIAL PERFUSION IMAGING   LONG TERM MONITOR (3-14 DAYS)   EKG 12-Lead   ECHOCARDIOGRAM COMPLETE   Medication Changes: Meds ordered this encounter  Medications   losartan (COZAAR) 50 MG tablet    Sig: Take 1.5 tablets (75 mg total) by mouth as directed. TAKE ONE TABLET BY MOUTH EVERY MORNING, AND TAKE 1/2 TABLET EVERY IN THE EVENING    Dispense:  135 tablet    Refill:  3   rosuvastatin (CRESTOR) 40 MG tablet  Sig: Take 1 tablet (40 mg total) by mouth 3 (three) times a week. .    Dispense:  12 tablet    Refill:  3   triamterene-hydrochlorothiazide (MAXZIDE-25) 37.5-25 MG tablet    Sig: Take 2 tablets by mouth daily.    Dispense:  180 tablet    Refill:  3   metoprolol succinate (TOPROL-XL) 50 MG 24 hr tablet    Sig: Take 1 tablet (50 mg total) by mouth in the morning and at bedtime. Take with or immediately following a meal.    Dispense:  180 tablet    Refill:  3   Signed, Richardson Dopp, PA-C  08/24/2022 12:03 PM    [address], [place], [place]  [address] Phone:  [phone]; Fax: [phone]

## 2022-08-24 ENCOUNTER — Ambulatory Visit: Payer: Medicare HMO | Attending: Physician Assistant | Admitting: Physician Assistant

## 2022-08-24 ENCOUNTER — Ambulatory Visit: Payer: Medicare HMO | Attending: Physician Assistant

## 2022-08-24 ENCOUNTER — Telehealth: Payer: Self-pay | Admitting: Physician Assistant

## 2022-08-24 ENCOUNTER — Encounter: Payer: Self-pay | Admitting: *Deleted

## 2022-08-24 ENCOUNTER — Encounter: Payer: Self-pay | Admitting: Physician Assistant

## 2022-08-24 VITALS — BP 122/84 | HR 105 | Ht 64.5 in | Wt 178.2 lb

## 2022-08-24 DIAGNOSIS — R0609 Other forms of dyspnea: Secondary | ICD-10-CM | POA: Diagnosis not present

## 2022-08-24 DIAGNOSIS — N1832 Chronic kidney disease, stage 3b: Secondary | ICD-10-CM | POA: Insufficient documentation

## 2022-08-24 DIAGNOSIS — E785 Hyperlipidemia, unspecified: Secondary | ICD-10-CM

## 2022-08-24 DIAGNOSIS — I2510 Atherosclerotic heart disease of native coronary artery without angina pectoris: Secondary | ICD-10-CM | POA: Diagnosis not present

## 2022-08-24 DIAGNOSIS — R0602 Shortness of breath: Secondary | ICD-10-CM

## 2022-08-24 DIAGNOSIS — I10 Essential (primary) hypertension: Secondary | ICD-10-CM

## 2022-08-24 DIAGNOSIS — R002 Palpitations: Secondary | ICD-10-CM

## 2022-08-24 DIAGNOSIS — I6523 Occlusion and stenosis of bilateral carotid arteries: Secondary | ICD-10-CM

## 2022-08-24 DIAGNOSIS — R079 Chest pain, unspecified: Secondary | ICD-10-CM

## 2022-08-24 DIAGNOSIS — I493 Ventricular premature depolarization: Secondary | ICD-10-CM | POA: Insufficient documentation

## 2022-08-24 DIAGNOSIS — Z950 Presence of cardiac pacemaker: Secondary | ICD-10-CM | POA: Insufficient documentation

## 2022-08-24 HISTORY — DX: Ventricular premature depolarization: I49.3

## 2022-08-24 MED ORDER — LOSARTAN POTASSIUM 50 MG PO TABS: 75.0000 mg | ORAL_TABLET | ORAL | 3 refills | Status: DC

## 2022-08-24 MED ORDER — TRIAMTERENE-HCTZ 37.5-25 MG PO TABS: 2.0000 | ORAL_TABLET | Freq: Every day | ORAL | 3 refills | Status: DC

## 2022-08-24 MED ORDER — NITROGLYCERIN 0.4 MG SL SUBL: 0.4000 mg | SUBLINGUAL_TABLET | SUBLINGUAL | 11 refills | Status: AC | PRN

## 2022-08-24 MED ORDER — ROSUVASTATIN CALCIUM 40 MG PO TABS: 40.0000 mg | ORAL_TABLET | ORAL | 3 refills | Status: DC

## 2022-08-24 MED ORDER — METOPROLOL SUCCINATE ER 50 MG PO TB24: 50.0000 mg | ORAL_TABLET | Freq: Two times a day (BID) | ORAL | 3 refills | Status: DC

## 2022-08-24 NOTE — Progress Notes (Unsigned)
Enrolled for [employer] to mail a ZIO XT long term holter monitor to the patients address on file.   Dr. Cooper to read. 

## 2022-08-24 NOTE — Assessment & Plan Note (Signed)
He notes shortness of breath with mild to mod activity over the past 5 mos. He does have a lot of back and hip pain. His anginal equiv prior to his CABG was shortness of breath. He has not had chest pain. He has not had a NST since 2020. He has a lot of PVCs on his EKG today. He does not have any signs or symptoms of CHF. He is an ex smoker. His PFTs in 2021 showed mild restriction and mildly reduced DLCO.  Echocardiogram  Lexiscan Myoview BMET, BNP today F/u in 8 weeks

## 2022-08-24 NOTE — Assessment & Plan Note (Signed)
Will try to avoid cardiac catheterization if at all possible to protect his kidneys. Obtain BMET today.

## 2022-08-24 NOTE — Assessment & Plan Note (Signed)
F/u with [facility] as planned.

## 2022-08-24 NOTE — Assessment & Plan Note (Signed)
Continue Crestor 40 mg 3 x a week. Recent LFTs normal. Obtain direct LDL today.

## 2022-08-24 NOTE — Assessment & Plan Note (Signed)
S/p CABG in 2014.  Myoview in 05/2019 with inf scar with peri-infarct ischemia. He is having dyspnea on exertion over the past 5 mos. Question if this could be an anginal equiv vs multifactorial dyspnea. He has not had chest pain. Obtain Myoview as noted. Increase Toprol to 50 mg twice daily. Continue ASA 81 mg once daily, Crestor 40 mg 3 x a week. F/u in 8 weeks.

## 2022-08-24 NOTE — Assessment & Plan Note (Signed)
Bilateral ICA stenosis 40-59% on [place] in 2021. He will need f/u arranged at next visit.

## 2022-08-24 NOTE — Assessment & Plan Note (Signed)
F/u with EP as planned.  

## 2022-08-24 NOTE — Patient Instructions (Signed)
Medication Instructions:  Your physician has recommended you make the following change in your medication:   INCREASE the Metoprolol to twice a day   *If you need a refill on your cardiac medications before your next appointment, please call your pharmacy*   Lab Work: TODAY:  BMET, PRO BNP, MAGNESIUM & DIRECT LDL  If you have labs (blood work) drawn today and your tests are completely normal, you will receive your results only by: [place] (if you have MyChart) OR A paper copy in the mail If you have any lab test that is abnormal or we need to change your treatment, we will call you to review the results.   Testing/Procedures: Your physician has requested that you have an echocardiogram. Echocardiography is a painless test that uses sound waves to create images of your heart. It provides your doctor with information about the size and shape of your heart and how well your heart's chambers and valves are working. This procedure takes approximately one hour. There are no restrictions for this procedure.   Your physician has requested that you have a lexiscan myoview. For further information please visit [email or web address]. Please follow instruction sheet, BELOW:    You are scheduled for a Myocardial Perfusion Imaging Study  Please arrive 15 minutes prior to your appointment time for registration and insurance purposes.  The test will take approximately 3 to 4 hours to complete; you may bring reading material.  If someone comes with you to your appointment, they will need to remain in the main lobby due to limited space in the testing area. **If you are pregnant or breastfeeding, please notify the nuclear lab prior to your appointment**  How to prepare for your Myocardial Perfusion Test: Do not eat or drink 3 hours prior to your test, except you may have water. Do not consume products containing caffeine (regular or decaffeinated) 12 hours prior to your test. (ex: coffee,  chocolate, sodas, tea). Do bring a list of your current medications with you.  If not listed below, you may take your medications as normal. Do wear comfortable clothes (no dresses or overalls) and walking shoes, tennis shoes preferred (No heels or open toe shoes are allowed). Do NOT wear cologne, perfume, aftershave, or lotions (deodorant is allowed). If these instructions are not followed, your test will have to be rescheduled.      Follow-Up: At [facility], you and your health needs are our priority.  As part of our continuing mission to provide you with exceptional heart care, we have created designated Provider Care Teams.  These Care Teams include your primary Cardiologist (physician) and Advanced Practice Providers (APPs -  Physician Assistants and Nurse Practitioners) who all work together to provide you with the care you need, when you need it.  We recommend signing up for the patient portal called "MyChart".  Sign up information is provided on this After Visit Summary.  MyChart is used to connect with patients for Virtual Visits (Telemedicine).  Patients are able to view lab/test results, encounter notes, upcoming appointments, etc.  Non-urgent messages can be sent to your provider as well.   To learn more about what you can do with MyChart, go to [email or web address].    Your next appointment:   8 week(s)  The format for your next appointment:   In Person  Provider:   Sherren Mocha, MD  or Richardson Dopp, PA-C         Other Instructions   Important Information About Sugar

## 2022-08-24 NOTE — Assessment & Plan Note (Signed)
He has a lot of PVCs on his EKG today. I did check with device clinic. They cannot get a PVC burden from his device. Obtain BMET, Mg2+ today. Obtain 3 day Zio XT. Increase Toprol Xl to 50 mg twice daily. F/u in 8 weeks.

## 2022-08-24 NOTE — Telephone Encounter (Signed)
  Pt c/o medication issue:  1. Name of Medication: nitroglycerin  2. How are you currently taking this medication (dosage and times per day)?   3. Are you having a reaction (difficulty breathing--STAT)? No   4. What is your medication issue? Pt said, he was told by Richardson Dopp he will call in nitroglycerin for him, he was at pharmacy and no nitroglycerin called. He said, If he will send prescription, he wants to send it at  [id number] [place], [place] going home today

## 2022-08-24 NOTE — Assessment & Plan Note (Signed)
BP controlled. Increase Toprol for anti anginal Rx and to control PVCs. Continue Losartan 50 mg in A and 25 mg in P, Maxzide 37.5/25 mg 2 tabs daily.

## 2022-08-25 LAB — BASIC METABOLIC PANEL
BUN/Creatinine Ratio: 23 (ref 10–24)
BUN: 39 mg/dL — ABNORMAL HIGH (ref 8–27)
CO2: 24 mmol/L (ref 20–29)
Calcium: 10 mg/dL (ref 8.6–10.2)
Chloride: 99 mmol/L (ref 96–106)
Creatinine, Ser: 1.7 mg/dL — ABNORMAL HIGH (ref 0.76–1.27)
Glucose: 117 mg/dL — ABNORMAL HIGH (ref 70–99)
Potassium: 3.8 mmol/L (ref 3.5–5.2)
Sodium: 140 mmol/L (ref 134–144)
eGFR: 43 mL/min/{1.73_m2} — ABNORMAL LOW (ref >59)

## 2022-08-25 LAB — MAGNESIUM: Magnesium: 1.6 mg/dL (ref 1.6–2.3)

## 2022-08-25 LAB — LDL CHOLESTEROL, DIRECT: LDL Direct: 79 mg/dL (ref 0–99)

## 2022-08-25 LAB — PRO B NATRIURETIC PEPTIDE: NT-Pro BNP: 571 pg/mL — ABNORMAL HIGH (ref 0–376)

## 2022-08-26 ENCOUNTER — Telehealth: Payer: Self-pay

## 2022-08-26 DIAGNOSIS — N1832 Chronic kidney disease, stage 3b: Secondary | ICD-10-CM

## 2022-08-26 DIAGNOSIS — E785 Hyperlipidemia, unspecified: Secondary | ICD-10-CM

## 2022-08-26 DIAGNOSIS — I10 Essential (primary) hypertension: Secondary | ICD-10-CM

## 2022-08-26 MED ORDER — EZETIMIBE 10 MG PO TABS: 10.0000 mg | ORAL_TABLET | Freq: Every day | ORAL | 3 refills | Status: DC

## 2022-08-26 NOTE — Telephone Encounter (Signed)
-----   Message from Liliane Shi, [place] sent at 08/25/2022  1:39 PM EDT ----- Results sent to [name] via [facility]. See MyChart comment. PLAN:  -Start Ezetimibe 10 once daily  -Lipids, CMET in 3 mos  Richardson Dopp, [place]    08/25/2022 1:34 PM

## 2022-08-26 NOTE — Telephone Encounter (Signed)
Spoke with patient and discussed lab results.  Per Richardson Dopp, PA-C: Mr. Preece  Your creatinine (kidney function) is stable.  Your potassium, magnesium are low normal.  Try to increase potassium and magnesium rich foods in your diet.  They BNP (heart failure test) is not significantly elevated.  This suggests that your shortness of breath is not related to a fluid issue.  Your LDL cholesterol is above goal.  I will add a medication called ezetimibe 10 mg daily to get your LDL to goal and plan repeat labs in 3 months. Richardson Dopp, PA-C     Zetia '10mg'$  QD ordered and sent to pharmacy of choice.  Lipid, CMET ordered. Patient states he will make his lab appt when he comes for treadmill test 09/19/22. Labs due to be collected at end of December early January.  Patient verbalized understanding of the above and expressed appreciation for call.

## 2022-08-26 NOTE — Telephone Encounter (Signed)
I sent the NTG Rx in earlier this week. Richardson Dopp, PA-C    08/26/2022 8:06 AM

## 2022-08-30 DIAGNOSIS — I2510 Atherosclerotic heart disease of native coronary artery without angina pectoris: Secondary | ICD-10-CM | POA: Diagnosis not present

## 2022-08-30 DIAGNOSIS — R079 Chest pain, unspecified: Secondary | ICD-10-CM

## 2022-08-30 DIAGNOSIS — R0602 Shortness of breath: Secondary | ICD-10-CM

## 2022-08-30 DIAGNOSIS — R002 Palpitations: Secondary | ICD-10-CM

## 2022-09-12 ENCOUNTER — Telehealth (HOSPITAL_COMMUNITY): Payer: Self-pay | Admitting: *Deleted

## 2022-09-12 NOTE — Telephone Encounter (Signed)
Patient given detailed instructions per Myocardial Perfusion Study Information Sheet for the test on 09/19/2022 at 10:15. Patient notified to arrive 15 minutes early and that it is imperative to arrive on time for appointment to keep from having the test rescheduled.  If you need to cancel or reschedule your appointment, please call the office within 24 hours of your appointment. . Patient verbalized understanding.Douglas Christensen

## 2022-09-13 ENCOUNTER — Other Ambulatory Visit: Payer: Self-pay | Admitting: Cardiovascular Disease

## 2022-09-14 ENCOUNTER — Telehealth: Payer: Self-pay | Admitting: Cardiovascular Disease

## 2022-09-14 ENCOUNTER — Encounter: Payer: Self-pay | Admitting: Physician Assistant

## 2022-09-14 ENCOUNTER — Telehealth: Payer: Self-pay | Admitting: *Deleted

## 2022-09-14 MED ORDER — METOPROLOL SUCCINATE ER 50 MG PO TB24: 75.0000 mg | ORAL_TABLET | Freq: Two times a day (BID) | ORAL | 3 refills | Status: DC

## 2022-09-14 MED ORDER — METOPROLOL SUCCINATE ER 50 MG PO TB24: 75.0000 mg | ORAL_TABLET | Freq: Every day | ORAL | 3 refills | Status: DC

## 2022-09-14 NOTE — Telephone Encounter (Signed)
Pt's pharmacy is requesting a refill on tadalafil. Would Dr. Burt Knack like to refill this medication? Please address

## 2022-09-14 NOTE — Telephone Encounter (Signed)
Pt is returning call for results of monitor. Transferred to [place], [place].

## 2022-09-14 NOTE — Telephone Encounter (Signed)
Duplicate phone encounter.

## 2022-09-14 NOTE — Addendum Note (Signed)
Addended by: Gaetano Net on: 09/14/2022 02:11 PM   Modules accepted: Orders

## 2022-09-14 NOTE — Telephone Encounter (Signed)
-----   Message from Liliane Shi, [place] sent at 09/14/2022  9:05 AM EDT ----- Monitor demonstrates 10% PVC burden (intermediate burden). PLAN:  -Increase Toprol XL to 75 mg once daily to suppress PVCs more. -Send copy to PCP -F/u as planned.  Richardson Dopp, PA-C    09/14/2022 8:40 AM

## 2022-09-18 NOTE — Telephone Encounter (Signed)
This is fine thanks 

## 2022-09-19 ENCOUNTER — Ambulatory Visit (HOSPITAL_COMMUNITY): Payer: Medicare HMO | Attending: Physician Assistant

## 2022-09-19 ENCOUNTER — Ambulatory Visit (HOSPITAL_BASED_OUTPATIENT_CLINIC_OR_DEPARTMENT_OTHER): Payer: Medicare HMO

## 2022-09-19 ENCOUNTER — Other Ambulatory Visit: Payer: Self-pay | Admitting: Internal Medicine

## 2022-09-19 DIAGNOSIS — I2510 Atherosclerotic heart disease of native coronary artery without angina pectoris: Secondary | ICD-10-CM

## 2022-09-19 DIAGNOSIS — I493 Ventricular premature depolarization: Secondary | ICD-10-CM | POA: Diagnosis present

## 2022-09-19 DIAGNOSIS — R0602 Shortness of breath: Secondary | ICD-10-CM

## 2022-09-19 DIAGNOSIS — R079 Chest pain, unspecified: Secondary | ICD-10-CM

## 2022-09-19 LAB — MYOCARDIAL PERFUSION IMAGING
LV dias vol: 65 mL (ref 62–150)
LV sys vol: 25 mL
Nuc Stress EF: 62 %
Rest Nuclear Isotope Dose: 10.3 mCi
SDS: 2
SRS: 0
SSS: 2
Stress Nuclear Isotope Dose: 30.9 mCi
TID: 0.95

## 2022-09-19 LAB — ECHOCARDIOGRAM COMPLETE
Area-P 1/2: 2.95 cm2
Height: 64.5 in
S' Lateral: 3.1 cm
Weight: 2848 oz

## 2022-09-19 MED ORDER — TECHNETIUM TC 99M TETROFOSMIN IV KIT
10.3000 | PACK | Freq: Once | INTRAVENOUS | Status: AC | PRN
  Administered 2022-09-19: 10.3 via INTRAVENOUS

## 2022-09-19 MED ORDER — REGADENOSON 0.4 MG/5ML IV SOLN
0.4000 mg | Freq: Once | INTRAVENOUS | Status: AC
  Administered 2022-09-19: 0.4 mg via INTRAVENOUS

## 2022-09-19 MED ORDER — PERFLUTREN LIPID MICROSPHERE
1.0000 mL | INTRAVENOUS | Status: AC | PRN
  Administered 2022-09-19: 2 mL via INTRAVENOUS

## 2022-09-19 MED ORDER — TECHNETIUM TC 99M TETROFOSMIN IV KIT
30.9000 | PACK | Freq: Once | INTRAVENOUS | Status: AC | PRN
  Administered 2022-09-19: 30.9 via INTRAVENOUS

## 2022-09-20 ENCOUNTER — Encounter: Payer: Self-pay | Admitting: Physician Assistant

## 2022-09-26 NOTE — Telephone Encounter (Signed)
Yes this is okay to refill.  Thank you

## 2022-09-27 ENCOUNTER — Ambulatory Visit (INDEPENDENT_AMBULATORY_CARE_PROVIDER_SITE_OTHER): Payer: Medicare HMO

## 2022-09-27 DIAGNOSIS — I440 Atrioventricular block, first degree: Secondary | ICD-10-CM | POA: Diagnosis not present

## 2022-09-27 LAB — CUP PACEART REMOTE DEVICE CHECK
Battery Remaining Longevity: 110 mo
Battery Voltage: 3.01 V
Brady Statistic AP VP Percent: 28.13 %
Brady Statistic AP VS Percent: 0.12 %
Brady Statistic AS VP Percent: 65.19 %
Brady Statistic AS VS Percent: 6.56 %
Brady Statistic RA Percent Paced: 30.35 %
Brady Statistic RV Percent Paced: 93.21 %
Date Time Interrogation Session: 20231030212323
Implantable Lead Connection Status: 753985
Implantable Lead Connection Status: 753985
Implantable Lead Implant Date: 20201231
Implantable Lead Implant Date: 20201231
Implantable Lead Location: 753859
Implantable Lead Location: 753860
Implantable Lead Model: 3830
Implantable Lead Model: 5076
Implantable Pulse Generator Implant Date: 20201231
Lead Channel Impedance Value: 361 Ohm
Lead Channel Impedance Value: 437 Ohm
Lead Channel Impedance Value: 475 Ohm
Lead Channel Impedance Value: 589 Ohm
Lead Channel Pacing Threshold Amplitude: 0.625 V
Lead Channel Pacing Threshold Amplitude: 0.875 V
Lead Channel Pacing Threshold Pulse Width: 0.4 ms
Lead Channel Pacing Threshold Pulse Width: 0.4 ms
Lead Channel Sensing Intrinsic Amplitude: 7.25 mV
Lead Channel Sensing Intrinsic Amplitude: 7.25 mV
Lead Channel Sensing Intrinsic Amplitude: 7.625 mV
Lead Channel Sensing Intrinsic Amplitude: 7.625 mV
Lead Channel Setting Pacing Amplitude: 1.5 V
Lead Channel Setting Pacing Amplitude: 2.5 V
Lead Channel Setting Pacing Pulse Width: 0.4 ms
Lead Channel Setting Sensing Sensitivity: 1.2 mV
Zone Setting Status: 755011
Zone Setting Status: 755011

## 2022-10-12 NOTE — Progress Notes (Signed)
Remote pacemaker transmission.   

## 2022-10-18 ENCOUNTER — Ambulatory Visit: Payer: Medicare HMO | Admitting: Physician Assistant

## 2022-11-18 ENCOUNTER — Other Ambulatory Visit: Payer: Self-pay | Admitting: Physician Assistant

## 2022-11-30 NOTE — Progress Notes (Deleted)
Cardiology Office Note:    Date:  11/30/2022   ID:  Douglas Christensen, DOB Dec 19, 1950, MRN [id number]  PCP:  Cassandria Anger, MD  [facility] Cardiologist:  Sherren Mocha, MD Electrophysiologist:  Thompson Grayer, MD { Click to update primary MD,subspecialty MD or APP then REFRESH:1}  *** Referring MD: Plotnikov, Evie Lacks, MD   Chief Complaint:  No chief complaint on file. {Click here for Visit Info    :1}   Patient Profile: CAD s/p CABG in 04/2013 Myoview 06/04/19: EF 60, inf scar w/ peri-infarct ischemia; unchanged from 2019 TTE 09/19/2022: EF 55-60, no RWMA, mild LVH, normal RVSF, RAE, mild MR, AV sclerosis without stenosis, RAP 3 Myoview 09/19/2022: EF 62, no ischemia; low risk PAD  s/p R SFA stent and L CIA stent in 2008 L EIA stent 2012 L EIA/CFA/PFA/SFA endarterectomy R fem-pop 2014 L SFA stent in 3/16 S/p stent to R CIA, R EIA, R pop in 07/2019 Carotid artery stenosis [place] 9/21: bilat ICA 40-59; L VA 100; R subcl stenosis   R subclavian stenosis L VA occluded  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 Marin Olp    History of Present Illness:   Douglas Christensen is a 72 y.o. male with the above problem list.  He last seen 08/24/2022.  He had recently had COVID.  He continued to note shortness of breath.  I obtained a NT-proBNP.  This was not significantly elevated.  Echocardiogram demonstrated normal LV function.  Nuclear stress test demonstrated no ischemia and was low risk.  He did have a lot of PVCs on his EKG.  A follow-up monitor demonstrated 10% PVC burden.  I increased  his metoprolol succinate to 75 mg daily.  He returns for follow up.***      EKG:  {Desc; done/not:10129}    Reviewed and updated this encounter:***     ROS  Labs/Other Test Reviewed:   Recent Labs: 12/02/2021: TSH 1.61 07/26/2022: ALT 13; Hemoglobin 14.7; Platelet Count 229 08/24/2022: BUN 39; Creatinine, Ser 1.70; Magnesium 1.6; NT-Pro BNP 571; Potassium 3.8; Sodium 140  Recent Lipid Panel Recent Labs    08/24/22 1135  LDLDIRECT 79    Risk Assessment/Calculations/Metrics:   {Does this patient have ATRIAL FIBRILLATION?:[id number]}     No BP recorded.  {Refresh Note OR Click here to enter BP  :1}***   Physical Exam:   VS:  There were no vitals taken for this visit.   Wt Readings from Last 3 Encounters:  09/19/22 178 lb (80.7 kg)  08/24/22 178 lb 3.2 oz (80.8 kg)  07/26/22 184 lb 6.4 oz (83.6 kg)    Physical Exam ***     ASSESSMENT & PLAN:   No problem-specific Assessment & Plan notes found for this encounter.  {ReDyspnea He notes shortness of breath with mild to mod activity over the past 5 mos. He does have a lot of back and hip pain. His anginal equiv prior to his CABG was shortness of breath. He has not had chest pain. He has not had a NST since 2020. He has a lot of PVCs on his  EKG today. He does not have any signs or symptoms of CHF. He is an ex smoker. His PFTs in 2021 showed mild restriction and mildly reduced DLCO.  Echocardiogram  Lexiscan Myoview BMET, BNP today F/u in 8 weeks   CAD (coronary artery disease) S/p CABG in 2014.  Myoview in 05/2019 with inf scar with peri-infarct ischemia. He is having dyspnea on exertion over the past 5 mos. Question if this could be an anginal equiv vs multifactorial dyspnea. He has not had chest pain. Obtain Myoview as noted. Increase Toprol to 50 mg twice daily. Continue ASA 81 mg once daily, Crestor 40 mg 3 x a week. F/u in 8 weeks.    Hyperlipidemia LDL goal <70 Continue Crestor 40 mg 3 x a week. Recent LFTs normal. Obtain direct  LDL today.    PVC's (premature ventricular contractions) He has a lot of PVCs on his EKG today. I did check with device clinic. They cannot get a PVC burden from his device. Obtain BMET, Mg2+ today. Obtain 3 day Zio XT. Increase Toprol Xl to 50 mg twice daily. F/u in 8 weeks.    Carotid artery disease ([place]) Bilateral ICA stenosis 40-59% on [place] in 2021. He will need f/u arranged at next visit.    Atherosclerosis of native arteries of the extremities with intermittent claudication F/u with [facility] as planned.    Pacemaker F/u with EP as planned.    Stage 3b chronic kidney disease ([place]) Will try to avoid cardiac catheterization if at all possible to protect his kidneys. Obtain BMET today.   Essential hypertension BP controlled. Increase Toprol for anti anginal Rx and to control PVCs. Continue Losartan 50 mg in A and 25 mg in P, Maxzide 37.5/25 mg 2 tabs daily.xt:1}      {Are you ordering a CV Procedure (e.g. stress test, cath, DCCV, TEE, etc)?   Press F2        :[id number]}  Dispo:  No follow-ups on file.  Medication Adjustments/Labs and Tests Ordered: Current medicines are reviewed at length with the patient today.  Concerns regarding medicines are outlined above.  Tests Ordered: No orders of the defined types were placed in this encounter.  Medication Changes: No orders of the defined types were placed in this encounter.  Signed, Richardson Dopp, PA-C  11/30/2022 11:35 AM    [facility], [place], [place]  [address] Phone: [phone]; Fax: [phone]

## 2022-12-05 ENCOUNTER — Ambulatory Visit: Payer: Medicare HMO | Admitting: Physician Assistant

## 2022-12-05 DIAGNOSIS — I493 Ventricular premature depolarization: Secondary | ICD-10-CM

## 2022-12-05 DIAGNOSIS — I2510 Atherosclerotic heart disease of native coronary artery without angina pectoris: Secondary | ICD-10-CM

## 2022-12-27 ENCOUNTER — Ambulatory Visit: Payer: Medicare HMO | Admitting: Physician Assistant

## 2022-12-27 ENCOUNTER — Ambulatory Visit: Payer: Medicare HMO

## 2022-12-27 DIAGNOSIS — I440 Atrioventricular block, first degree: Secondary | ICD-10-CM | POA: Diagnosis not present

## 2022-12-27 LAB — CUP PACEART REMOTE DEVICE CHECK
Battery Remaining Longevity: 105 mo
Battery Voltage: 3 V
Brady Statistic AP VP Percent: 39.62 %
Brady Statistic AP VS Percent: 0.02 %
Brady Statistic AS VP Percent: 55.68 %
Brady Statistic AS VS Percent: 4.68 %
Brady Statistic RA Percent Paced: 41.82 %
Brady Statistic RV Percent Paced: 95.29 %
Date Time Interrogation Session: 20240129213907
Implantable Lead Connection Status: 753985
Implantable Lead Connection Status: 753985
Implantable Lead Implant Date: 20201231
Implantable Lead Implant Date: 20201231
Implantable Lead Location: 753859
Implantable Lead Location: 753860
Implantable Lead Model: 3830
Implantable Lead Model: 5076
Implantable Pulse Generator Implant Date: 20201231
Lead Channel Impedance Value: 342 Ohm
Lead Channel Impedance Value: 418 Ohm
Lead Channel Impedance Value: 513 Ohm
Lead Channel Impedance Value: 551 Ohm
Lead Channel Pacing Threshold Amplitude: 0.625 V
Lead Channel Pacing Threshold Amplitude: 0.625 V
Lead Channel Pacing Threshold Pulse Width: 0.4 ms
Lead Channel Pacing Threshold Pulse Width: 0.4 ms
Lead Channel Sensing Intrinsic Amplitude: 5.75 mV
Lead Channel Sensing Intrinsic Amplitude: 5.75 mV
Lead Channel Sensing Intrinsic Amplitude: 7.5 mV
Lead Channel Sensing Intrinsic Amplitude: 7.5 mV
Lead Channel Setting Pacing Amplitude: 1.5 V
Lead Channel Setting Pacing Amplitude: 2.5 V
Lead Channel Setting Pacing Pulse Width: 0.4 ms
Lead Channel Setting Sensing Sensitivity: 1.2 mV
Zone Setting Status: 755011
Zone Setting Status: 755011

## 2023-01-04 ENCOUNTER — Telehealth: Payer: Self-pay | Admitting: Cardiovascular Disease

## 2023-01-04 NOTE — Telephone Encounter (Signed)
Spoke with patient informed him that the report just meant that he had a fast heart rate that lasted <5 seconds, patient was worried that something was wrong assured patient that if something abnormal we would have called him on 1/30, patient voiced understanding

## 2023-01-04 NOTE — Telephone Encounter (Signed)
Patient states he received updates on MyChart regarding recent pacemaker interrogation and he would like to discuss.

## 2023-01-24 NOTE — Progress Notes (Signed)
Remote pacemaker transmission.   

## 2023-01-30 NOTE — Progress Notes (Unsigned)
Office Visit    Patient Name: Douglas Christensen Date of Encounter: 01/30/2023  Primary Care Provider:  Cassandria Anger, MD Primary Cardiologist:  Sherren Mocha, MD Primary Electrophysiologist: Thompson Grayer, MD (Inactive)  Chief Complaint    Douglas Christensen is a 72 y.o. male with PMH of CAD s/p CABG in 2014, second-degree AV block with PPM placed 2021 HTN, HLD, CKD, OSA, GERD, PAD s/p right SFA stent and left CIA stent 2008, L EIA stent 2012,L EIA/CFA/PFA/SFA endarterectomy, right femoropopliteal 2014, left SFA stent 2016, stent to R CIA, R EIA, R pop 07/2019, carotid stenosis bilateral 40-59% and right subclavian stenosis who presents today for for follow-up.  Past Medical History    Past Medical History:  Diagnosis Date   Anxiety    Arthritis    CAD (coronary artery disease)    Mild plaque (cath "years ago"); abnormal Myoview 04/2013 with subsequent CABG x 5 with LIMA to LAD, SVG to OM1, SVG to DX, SVG to PD & PL. // Myoview 10/23: EF 62, no ischemia or infarction; low risk   Carotid artery disease ([place])    Carotid US 07/2019 bilateral ICA 40-59; bilateral subclavian stenosis // Carotid US 9/21: Bilateral ICA 40-59; L VA occluded; R subclavian stenosis   Cataract    GERD (gastroesophageal reflux disease)    History of colonic polyps    History of echocardiogram    Echo 2/19: Mild LVH, EF 60-65, normal wall motion, grade 2 diastolic dysfunction, mild RAE   History of nuclear stress test    Myoview 2/19: inf/inf-lat/apical inf/ant-sept defect (?diaph atten - cannot rule out peri-infarct ischemia), PVCs/PACs/Mobitz 1 // Myoview 05/2019: EF 60, inf infarct with mild peri-infarct ischemia; no significant change when compared to prior study; Intermediate Risk   Hx of echocardiogram    Echo (9/15):  EF 55-60%; Gr 2 DD, mild BAE   Hyperlipidemia    Hypertension    LBP (low back pain)    Meralgia paresthetica of left side 2011   Polycythemia, secondary 02/03/2020   PVC's (premature  ventricular contractions) 08/24/2022   3 day Zio patch monitor 08/2022: There are frequent PVCs occurring at a burden of 10% with no sustained ventricular arrhythmia.  Echo 10/23: EF 55-60, no RWMA, mild LVH, normal RVSF, mild RAE, mild MR, AV sclerosis without stenosis, RAP 3    PVD (peripheral vascular disease) ([place])    Stent to left common femoral and right superficial femoral.  2008.  50%  left renal    Second degree AV block, Mobitz type I    Holter 2/19: Sinus rhythm, average heart rate 72, frequent PVCs (burden 5%), second-degree type I AV block and periods of 2:1 heart block >> continue clinical managment and avoid AVN blocking agents   Shortness of breath    "once in awhile; can happen at anytime" (08/26/2013)   Sleep apnea    mod OSA, central sleep apnea/hypoapnea syndrome 11/22/12, CPAP every night    Subclavian artery stenosis ([place])    Carotid US 07/2019 bilateral ICA 40-59; bilateral subclavian stenosis   Vitamin D deficiency    Past Surgical History:  Procedure Laterality Date   ABDOMINAL AORTAGRAM N/A 11/16/2011   Procedure: ABDOMINAL Maxcine Ham;  Surgeon: Sherren Mocha, MD;  Location: [facility] CATH LAB;  Service: Cardiovascular;  Laterality: N/A;   ABDOMINAL AORTAGRAM N/A 11/14/2012   Procedure: ABDOMINAL Maxcine Ham;  Surgeon: Sherren Mocha, MD;  Location: [facility] CATH LAB;  Service: Cardiovascular;  Laterality: N/A;   ABDOMINAL AORTAGRAM N/A  12/30/2014   Procedure: ABDOMINAL Maxcine Ham;  Surgeon: Serafina Mitchell, MD;  Location: [facility] CATH LAB;  Service: Cardiovascular;  Laterality: N/A;   ABDOMINAL AORTOGRAM W/LOWER EXTREMITY Bilateral 08/06/2019   Procedure: ABDOMINAL AORTOGRAM W/LOWER EXTREMITY;  Surgeon: Serafina Mitchell, MD;  Location: [place] CV LAB;  Service: Cardiovascular;  Laterality: Bilateral;   ABDOMINAL AORTOGRAM W/LOWER EXTREMITY Bilateral 11/02/2021   Procedure: ABDOMINAL AORTOGRAM W/LOWER EXTREMITY;  Surgeon: Serafina Mitchell, MD;  Location: [place] CV LAB;  Service:  Cardiovascular;  Laterality: Bilateral;   CARDIAC CATHETERIZATION  05/02/13   x2    CORONARY ARTERY BYPASS GRAFT N/A 05/06/2013   Procedure: CORONARY ARTERY BYPASS GRAFTING (CABG);  Surgeon: Melrose Nakayama, MD;  Location: [place];  Service: Open Heart Surgery;  Laterality: N/A;  Coronary artery bypass graft times five using left internal mammary artery and left greater saphenous vein via endovein harvest.   ENDARTERECTOMY FEMORAL Left 01/29/2015   Procedure: ENDARTERECTOMY FEMORAL WITH PATCH ANGIOPLASTY;  Surgeon: Serafina Mitchell, MD;  Location: [facility] OR;  Service: Vascular;  Laterality: Left;   EYE SURGERY  03/24/16   cataract surgery on left eye   FEMORAL-POPLITEAL BYPASS GRAFT  12/27/2012   Procedure: BYPASS GRAFT FEMORAL-POPLITEAL ARTERY;  Surgeon: Serafina Mitchell, MD;  Location: [facility] OR;  Service: Vascular;  Laterality: Right;  using non-reversed sapphenous vein.   ILIAC ATHERECTOMY Left 01/29/2015   Procedure: SUPERFICIAL FEMORAL ARTERY ATHERECTOMY/PERCUTANEOUS TRANSLUMINAL ANGIOPLASTY; superficial femoral artery stent;  Surgeon: Serafina Mitchell, MD;  Location: [place];  Service: Vascular;  Laterality: Left;   LOWER EXTREMITY ANGIOGRAM Bilateral 11/16/2011   Procedure: LOWER EXTREMITY ANGIOGRAM;  Surgeon: Sherren Mocha, MD;  Location: [facility] CATH LAB;  Service: Cardiovascular;  Laterality: Bilateral;   lower extremity stents     bilateral lower extremities x 2   PACEMAKER IMPLANT N/A 11/28/2019   Procedure: PACEMAKER IMPLANT;  Surgeon: Thompson Grayer, MD;  Location: [place] CV LAB;  Service: Cardiovascular;  Laterality: N/A;   PERCUTANEOUS STENT INTERVENTION Left 11/16/2011   Procedure: PERCUTANEOUS STENT INTERVENTION;  Surgeon: Sherren Mocha, MD;  Location: [facility] CATH LAB;  Service: Cardiovascular;  Laterality: Left;   PERIPHERAL VASCULAR ATHERECTOMY Right 08/06/2019   Procedure: PERIPHERAL VASCULAR ATHERECTOMY;  Surgeon: Serafina Mitchell, MD;  Location: [place] CV LAB;  Service: Cardiovascular;   Laterality: Right;  Common iliac, external iliac, and popliteal.   PERIPHERAL VASCULAR INTERVENTION Right 08/06/2019   Procedure: PERIPHERAL VASCULAR INTERVENTION;  Surgeon: Serafina Mitchell, MD;  Location: [place] CV LAB;  Service: Cardiovascular;  Laterality: Right;  common iliac, external iliac, and popliteal.   TONSILLECTOMY     TOTAL HIP ARTHROPLASTY Left 06/29/2015   Procedure: TOTAL HIP ARTHROPLASTY;  Surgeon: Frederik Pear, MD;  Location: [place];  Service: Orthopedics;  Laterality: Left;  LEFT TOTAL HIP ARTHROPLASTY DEPUY SROM/PINNACLE    Allergies  Allergies  Allergen Reactions   Roxicodone [Oxycodone Hcl] Other (See Comments)    hallucinations   Benazepril Cough   Gabapentin Rash   Hydralazine Hives and Rash    Skin lupus type reaction   Hydrocodone Hives   Itraconazole Nausea Only and Rash   Lipitor [Atorvastatin Calcium]     cramps   Oxycodone Hives    History of Present Illness    Douglas Christensen  is a 72 year old male with the above mention past medical history who presents today for follow-up.  Douglas Christensen was last seen by Richardson Dopp, PA on 08/24/2022 following a recent bout of  COVID.  He had noted increased shortness of breath with exertion that occurred over several months.  He had previous stress testing completed in 2020 that demonstrated no ischemia.  Lexiscan Myoview was completed that was normal with no evidence of ischemia.  2D echo was also completed and showed EF of 55-60% with no RWMA and mild LVH with normal RV systolic function with mild MVR.  He was given 3-day ZIO monitor due to elevated PVCs on EKG.  Patient's event monitor results showed 10% PVC burden and Toprol was increased to 70 mg once per day.   Since last being seen in the office patient reports***.  Patient denies chest pain, palpitations, dyspnea, PND, orthopnea, nausea, vomiting, dizziness, syncope, edema, weight gain, or early satiety.     ***Notes:  Home Medications    Current Outpatient  Medications  Medication Sig Dispense Refill   nitroGLYCERIN (NITROSTAT) 0.4 MG SL tablet Place 1 tablet (0.4 mg total) under the tongue every 5 (five) minutes as needed for chest pain. 25 tablet 11   acetaminophen (TYLENOL) 650 MG CR tablet Take 1,300 mg by mouth every 8 (eight) hours as needed for pain.     Ascorbic Acid (VITAMIN C PO) Take 1 tablet by mouth in the morning.     aspirin EC 81 MG tablet Take 81 mg by mouth every other day. Swallow whole.     benzonatate (TESSALON) 100 MG capsule Take 1 capsule (100 mg total) by mouth 3 (three) times daily as needed for cough. 30 capsule 0   cholecalciferol (VITAMIN D) 1000 units tablet Take 1,000 Units by mouth daily.      clonazePAM (KLONOPIN) 0.5 MG tablet TAKE ONE TABLET BY MOUTH TWICE DAILY AS NEEDED FOR ANXIETY 60 tablet 3   cyclobenzaprine (FLEXERIL) 10 MG tablet Take 1 tablet (10 mg total) by mouth 3 (three) times daily as needed for muscle spasms. 60 tablet 1   ezetimibe (ZETIA) 10 MG tablet Take 1 tablet (10 mg total) by mouth daily. 90 tablet 3   losartan (COZAAR) 50 MG tablet Take 1.5 tablets (75 mg total) by mouth as directed. TAKE ONE TABLET BY MOUTH EVERY MORNING, AND TAKE 1/2 TABLET EVERY IN THE EVENING 135 tablet 3   metoprolol succinate (TOPROL-XL) 50 MG 24 hr tablet Take 1.5 tablets (75 mg total) by mouth 2 (two) times daily. Take with or immediately following a meal. 270 tablet 3   rosuvastatin (CRESTOR) 40 MG tablet Take 1 tablet (40 mg total) by mouth 3 (three) times a week. . 12 tablet 3   tadalafil (CIALIS) 20 MG tablet TAKE ONE TABLET BY MOUTH EVERY THREE DAYS AS NEEDED FOR erectile dysfunction 30 tablet 3   traMADol (ULTRAM) 50 MG tablet Take 1 tablet (50 mg total) by mouth every 6 (six) hours as needed. 120 tablet 5   triamterene-hydrochlorothiazide (MAXZIDE-25) 37.5-25 MG tablet Take 2 tablets by mouth daily. 180 tablet 3   vitamin B-12 (CYANOCOBALAMIN) 1000 MCG tablet Take 1,000 mcg by mouth daily.     No current  facility-administered medications for this visit.     Review of Systems  Please see the history of present illness.    (+)*** (+)***  All other systems reviewed and are otherwise negative except as noted above.  Physical Exam    Wt Readings from Last 3 Encounters:  09/19/22 178 lb (80.7 kg)  08/24/22 178 lb 3.2 oz (80.8 kg)  07/26/22 184 lb 6.4 oz (83.6 kg)   [id number] were no vitals  filed for this visit.,There is no height or weight on file to calculate BMI.  Constitutional:      Appearance: Healthy appearance. Not in distress.  Neck:     Vascular: JVD normal.  Pulmonary:     Effort: Pulmonary effort is normal.     Breath sounds: No wheezing. No rales. Diminished in the bases Cardiovascular:     Normal rate. Regular rhythm. Normal S1. Normal S2.      Murmurs: There is no murmur.  Edema:    Peripheral edema absent.  Abdominal:     Palpations: Abdomen is soft non tender. There is no hepatomegaly.  Skin:    General: Skin is warm and dry.  Neurological:     General: No focal deficit present.     Mental Status: Alert and oriented to person, place and time.     Cranial Nerves: Cranial nerves are intact.  EKG/LABS/ Recent Cardiac Studies    ECG personally reviewed by me today - ***  Risk Assessment/Calculations:   {Does this patient have ATRIAL FIBRILLATION?:[id number]}        Lab Results  Component Value Date   WBC 10.7 (H) 07/26/2022   HGB 14.7 07/26/2022   HCT 42.7 07/26/2022   MCV 99.3 07/26/2022   PLT 229 07/26/2022   Lab Results  Component Value Date   CREATININE 1.70 (H) 08/24/2022   BUN 39 (H) 08/24/2022   NA 140 08/24/2022   K 3.8 08/24/2022   CL 99 08/24/2022   CO2 24 08/24/2022   Lab Results  Component Value Date   ALT 13 07/26/2022   AST 15 07/26/2022   ALKPHOS 58 07/26/2022   BILITOT 0.6 07/26/2022   Lab Results  Component Value Date   CHOL 132 10/05/2020   HDL 53.70 10/05/2020   LDLCALC 46 10/05/2020   LDLDIRECT 79 08/24/2022    TRIG 158.0 (H) 10/05/2020   CHOLHDL 2 10/05/2020    Lab Results  Component Value Date   HGBA1C 5.7 03/06/2017    Cardiac Studies & Procedures     STRESS TESTS  MYOCARDIAL PERFUSION IMAGING 09/19/2022  Narrative   The study is normal. The study is low risk.   LV perfusion is normal. There is no evidence of ischemia. There is no evidence of infarction.   Left ventricular function is normal. Nuclear stress EF: 62 %. The left ventricular ejection fraction is normal (55-65%). End diastolic cavity size is normal. End systolic cavity size is normal.   Prior study available for comparison from 06/05/2019.  Normal resting and stress perfusion. No ischemia or infarction EF 62%   ECHOCARDIOGRAM  ECHOCARDIOGRAM COMPLETE 09/19/2022  Narrative ECHOCARDIOGRAM REPORT    Patient Name:   Douglas Christensen   Date of Exam: 09/19/2022 Medical Rec #:  [id number]     Height:       64.5 in Accession #:    [id number]    Weight:       178.0 lb Date of Birth:  Jul 05, 1951      BSA:          1.872 m Patient Age:    67 years      BP:           122/84 mmHg Patient Gender: M             HR:           66 bpm. Exam Location:  [place]  Procedure: 2D Echo, Cardiac Doppler, Color Doppler and Intracardiac Opacification Agent  Indications:    R07.9 Chest pain  History:        Patient has prior history of Echocardiogram examinations, most recent 04/01/2020. CAD, Arrythmias:PVC, Signs/Symptoms:Shortness of Breath; Risk Factors:Hypertension, Dyslipidemia and Sleep Apnea.  Sonographer:    Cresenciano Lick RDCS Referring Phys: [id number] [address]olphin WEAVER  IMPRESSIONS   1. Left ventricular ejection fraction, by estimation, is 55 to 60%. The left ventricle has normal function. The left ventricle has no regional wall motion abnormalities. There is mild left ventricular hypertrophy. Left ventricular diastolic parameters are indeterminate. 2. Right ventricular systolic function is normal. The right ventricular size  is normal. Tricuspid regurgitation signal is inadequate for assessing PA pressure. 3. Right atrial size was mildly dilated. 4. The mitral valve is normal in structure. Mild mitral valve regurgitation. No evidence of mitral stenosis. 5. The aortic valve is tricuspid. Aortic valve regurgitation is not visualized. Aortic valve sclerosis/calcification is present, without any evidence of aortic stenosis. 6. The inferior vena cava is normal in size with greater than 50% respiratory variability, suggesting right atrial pressure of 3 mmHg.  FINDINGS Left Ventricle: Left ventricular ejection fraction, by estimation, is 55 to 60%. The left ventricle has normal function. The left ventricle has no regional wall motion abnormalities. Definity contrast agent was given IV to delineate the left ventricular endocardial borders. The left ventricular internal cavity size was normal in size. There is mild left ventricular hypertrophy. Left ventricular diastolic parameters are indeterminate.  Right Ventricle: The right ventricular size is normal. No increase in right ventricular wall thickness. Right ventricular systolic function is normal. Tricuspid regurgitation signal is inadequate for assessing PA pressure.  Left Atrium: Left atrial size was normal in size.  Right Atrium: Right atrial size was mildly dilated.  Pericardium: Trivial pericardial effusion is present. Presence of epicardial fat layer.  Mitral Valve: The mitral valve is normal in structure. Mild mitral valve regurgitation. No evidence of mitral valve stenosis.  Tricuspid Valve: The tricuspid valve is normal in structure. Tricuspid valve regurgitation is trivial.  Aortic Valve: The aortic valve is tricuspid. Aortic valve regurgitation is not visualized. Aortic valve sclerosis/calcification is present, without any evidence of aortic stenosis.  Pulmonic Valve: The pulmonic valve was not well visualized. Pulmonic valve regurgitation is  trivial.  Aorta: The aortic root is normal in size and structure.  Venous: The inferior vena cava is normal in size with greater than 50% respiratory variability, suggesting right atrial pressure of 3 mmHg.  IAS/Shunts: No atrial level shunt detected by color flow Doppler.   LEFT VENTRICLE PLAX 2D LVIDd:         4.30 cm   Diastology LVIDs:         3.10 cm   LV e' medial:    5.50 cm/s LV PW:         1.20 cm   LV E/e' medial:  17.6 LV IVS:        1.20 cm   LV e' lateral:   5.88 cm/s LVOT diam:     2.30 cm   LV E/e' lateral: 16.5 LV SV:         77 LV SV Index:   41 LVOT Area:     4.15 cm   RIGHT VENTRICLE             IVC RV Basal diam:  4.10 cm     IVC diam: 1.30 cm RV S prime:     12.75 cm/s TAPSE (M-mode): 1.7 cm  LEFT ATRIUM  Index        RIGHT ATRIUM           Index LA diam:        4.40 cm 2.35 cm/m   RA Area:     20.50 cm LA Vol (A2C):   31.8 ml 16.99 ml/m  RA Volume:   66.00 ml  35.26 ml/m LA Vol (A4C):   37.5 ml 20.03 ml/m LA Biplane Vol: 34.8 ml 18.59 ml/m AORTIC VALVE LVOT Vmax:   109.00 cm/s LVOT Vmean:  67.000 cm/s LVOT VTI:    0.186 m  AORTA Ao Root diam: 3.50 cm  MITRAL VALVE MV Area (PHT): 2.95 cm    SHUNTS MV Decel Time: 257 msec    Systemic VTI:  0.19 m MV E velocity: 96.90 cm/s  Systemic Diam: 2.30 cm MV A velocity: 66.00 cm/s MV E/A ratio:  1.47  Oswaldo Milian MD Electronically signed by Oswaldo Milian MD Signature Date/Time: 09/19/2022/2:05:57 PM    Final    MONITORS  LONG TERM MONITOR (3-14 DAYS) 09/13/2022  Narrative Patch Wear Time:  3 days and 1 hours (2023-10-03T19:14:55-0400 to 2023-10-06T20:36:00-0400)  Patient had a min HR of 60 bpm, max HR of 156 bpm, and avg HR of 73 bpm. Predominant underlying rhythm was Sinus Rhythm. Bundle Branch Block/IVCD was present. 2 Ventricular Tachycardia runs occurred, the run with the fastest interval lasting 6 beats with a max rate of 156 bpm (avg 121 bpm); the run  with the fastest interval was also the longest. Isolated SVEs were occasional (2.8%, 8882), SVE Triplets were rare (<1.0%, 38), and no SVE Couplets were present. Isolated VEs were frequent (10.0%, 32332), VE Couplets were occasional (1.4%, 2283), and VE Triplets were rare (<1.0%, 193). Ventricular Bigeminy and Trigeminy were present.  Summary: The basic rhythm is normal sinus with an average heart rate of 73 bpm.  There is no atrial fibrillation or flutter.  There is no high-grade AV block.  There are frequent PVCs occurring at a burden of 10% with no sustained ventricular arrhythmia.  The longest ventricular run is 6 beats.  There are occasional supraventricular ectopics occurring at a frequency of about 3%.           Assessment & Plan    1.  Coronary artery disease  2.  Dyspnea on exertion  3.  Carotid artery disease  4.  Chronic kidney disease stage IIIb  5.  Essential hypertension      Disposition: Follow-up with Sherren Mocha, MD or APP in *** months {Are you ordering a CV Procedure (e.g. stress test, cath, DCCV, TEE, etc)?   Press F2        :[id number]   Medication Adjustments/Labs and Tests Ordered: Current medicines are reviewed at length with the patient today.  Concerns regarding medicines are outlined above.   Signed, Mable Fill, Marissa Nestle, NP 01/30/2023, 11:29 AM [facility]  Note:  This document was prepared using Dragon voice recognition software and may include unintentional dictation errors.

## 2023-01-31 ENCOUNTER — Encounter: Payer: Self-pay | Admitting: Nurse Practitioner

## 2023-01-31 ENCOUNTER — Ambulatory Visit: Payer: Medicare HMO | Attending: Physician Assistant | Admitting: Nurse Practitioner

## 2023-01-31 VITALS — BP 124/70 | HR 71 | Ht 64.5 in | Wt 168.4 lb

## 2023-01-31 DIAGNOSIS — I6523 Occlusion and stenosis of bilateral carotid arteries: Secondary | ICD-10-CM

## 2023-01-31 DIAGNOSIS — R5383 Other fatigue: Secondary | ICD-10-CM

## 2023-01-31 DIAGNOSIS — I2510 Atherosclerotic heart disease of native coronary artery without angina pectoris: Secondary | ICD-10-CM | POA: Diagnosis not present

## 2023-01-31 DIAGNOSIS — R0609 Other forms of dyspnea: Secondary | ICD-10-CM

## 2023-01-31 DIAGNOSIS — N1832 Chronic kidney disease, stage 3b: Secondary | ICD-10-CM | POA: Diagnosis not present

## 2023-01-31 DIAGNOSIS — I10 Essential (primary) hypertension: Secondary | ICD-10-CM | POA: Diagnosis not present

## 2023-01-31 NOTE — Patient Instructions (Signed)
Medication Instructions:  Your physician recommends that you continue on your current medications as directed. Please refer to the Current Medication list given to you today. *If you need a refill on your cardiac medications before your next appointment, please call your pharmacy*   Lab Work: TODAY-CBC & TSH If you have labs (blood work) drawn today and your tests are completely normal, you will receive your results only by: [place] (if you have MyChart) OR A paper copy in the mail If you have any lab test that is abnormal or we need to change your treatment, we will call you to review the results.   Testing/Procedures: NONE ORDERED   Follow-Up: At [facility], you and your health needs are our priority.  As part of our continuing mission to provide you with exceptional heart care, we have created designated Provider Care Teams.  These Care Teams include your primary Cardiologist (physician) and Advanced Practice Providers (APPs -  Physician Assistants and Nurse Practitioners) who all work together to provide you with the care you need, when you need it.  We recommend signing up for the patient portal called "MyChart".  Sign up information is provided on this After Visit Summary.  MyChart is used to connect with patients for Virtual Visits (Telemedicine).  Patients are able to view lab/test results, encounter notes, upcoming appointments, etc.  Non-urgent messages can be sent to your provider as well.   To learn more about what you can do with MyChart, go to [email or web address].    Your next appointment:   6 month(s)  Provider:   Richardson Dopp, PA-C     Other Instructions

## 2023-02-01 ENCOUNTER — Telehealth: Payer: Self-pay

## 2023-02-01 LAB — CBC
Hematocrit: 40.3 % (ref 37.5–51.0)
Hemoglobin: 14 g/dL (ref 13.0–17.7)
MCH: 33.2 pg — ABNORMAL HIGH (ref 26.6–33.0)
MCHC: 34.7 g/dL (ref 31.5–35.7)
MCV: 96 fL (ref 79–97)
Platelets: 264 10*3/uL (ref 150–450)
RBC: 4.22 x10E6/uL (ref 4.14–5.80)
RDW: 12.1 % (ref 11.6–15.4)
WBC: 12.8 10*3/uL — ABNORMAL HIGH (ref 3.4–10.8)

## 2023-02-01 LAB — TSH: TSH: 1.27 u[IU]/mL (ref 0.450–4.500)

## 2023-02-01 NOTE — Telephone Encounter (Signed)
-----   Message from Marylu Lund., NP sent at 02/01/2023  7:57 AM EST ----- Please let Douglas Christensen know that his white cell count is elevated and thyroid function is normal.  I recommend that he follow-up with his PCP due to his unintentional weight loss, increased fatigue, and increased bruising for further treatment.  Please advise patient his findings are not related to any cardiac abnormalities.  Please let me know if you have any additional questions.  Please forward copy of results to Dr. Lurline Idol, NP

## 2023-02-01 NOTE — Telephone Encounter (Signed)
Called patient to discuss normal TSH and elevated WBC as well as Jaquelyn Bitter Dick's recommendation that he follow up with PCP to evaluate symtpoms further. Patient verbalizes understanding and states he has a visit with primary in April. Advised patient I would forward results to PCP.

## 2023-03-14 ENCOUNTER — Encounter: Payer: Self-pay | Admitting: Internal Medicine

## 2023-03-14 ENCOUNTER — Ambulatory Visit (INDEPENDENT_AMBULATORY_CARE_PROVIDER_SITE_OTHER): Payer: Medicare HMO | Admitting: Internal Medicine

## 2023-03-14 VITALS — BP 122/68 | HR 59 | Temp 97.7°F | Ht 64.0 in | Wt 160.0 lb

## 2023-03-14 DIAGNOSIS — N184 Chronic kidney disease, stage 4 (severe): Secondary | ICD-10-CM

## 2023-03-14 DIAGNOSIS — L719 Rosacea, unspecified: Secondary | ICD-10-CM

## 2023-03-14 DIAGNOSIS — J441 Chronic obstructive pulmonary disease with (acute) exacerbation: Secondary | ICD-10-CM

## 2023-03-14 DIAGNOSIS — E538 Deficiency of other specified B group vitamins: Secondary | ICD-10-CM | POA: Diagnosis not present

## 2023-03-14 DIAGNOSIS — D751 Secondary polycythemia: Secondary | ICD-10-CM

## 2023-03-14 DIAGNOSIS — Z87891 Personal history of nicotine dependence: Secondary | ICD-10-CM

## 2023-03-14 DIAGNOSIS — E559 Vitamin D deficiency, unspecified: Secondary | ICD-10-CM | POA: Diagnosis not present

## 2023-03-14 DIAGNOSIS — L570 Actinic keratosis: Secondary | ICD-10-CM | POA: Diagnosis not present

## 2023-03-14 DIAGNOSIS — G4733 Obstructive sleep apnea (adult) (pediatric): Secondary | ICD-10-CM | POA: Diagnosis not present

## 2023-03-14 LAB — COMPREHENSIVE METABOLIC PANEL
ALT: 8 U/L (ref 0–53)
AST: 11 U/L (ref 0–37)
Albumin: 4.1 g/dL (ref 3.5–5.2)
Alkaline Phosphatase: 66 U/L (ref 39–117)
BUN: 53 mg/dL — ABNORMAL HIGH (ref 6–23)
CO2: 21 mEq/L (ref 19–32)
Calcium: 9.6 mg/dL (ref 8.4–10.5)
Chloride: 109 mEq/L (ref 96–112)
Creatinine, Ser: 1.65 mg/dL — ABNORMAL HIGH (ref 0.40–1.50)
GFR: 41.36 mL/min — ABNORMAL LOW (ref >60.00)
Glucose, Bld: 85 mg/dL (ref 70–99)
Potassium: 3.8 mEq/L (ref 3.5–5.1)
Sodium: 139 mEq/L (ref 135–145)
Total Bilirubin: 0.5 mg/dL (ref 0.2–1.2)
Total Protein: 7.1 g/dL (ref 6.0–8.3)

## 2023-03-14 LAB — CBC WITH DIFFERENTIAL/PLATELET
Basophils Absolute: 0.1 10*3/uL (ref 0.0–0.1)
Basophils Relative: 0.9 % (ref 0.0–3.0)
Eosinophils Absolute: 0.4 10*3/uL (ref 0.0–0.7)
Eosinophils Relative: 3.4 % (ref 0.0–5.0)
HCT: 40.4 % (ref 39.0–52.0)
Hemoglobin: 13.6 g/dL (ref 13.0–17.0)
Lymphocytes Relative: 26.1 % (ref 12.0–46.0)
Lymphs Abs: 3.3 10*3/uL (ref 0.7–4.0)
MCHC: 33.7 g/dL (ref 30.0–36.0)
MCV: 97.5 fl (ref 78.0–100.0)
Monocytes Absolute: 1.1 10*3/uL — ABNORMAL HIGH (ref 0.1–1.0)
Monocytes Relative: 9 % (ref 3.0–12.0)
Neutro Abs: 7.6 10*3/uL (ref 1.4–7.7)
Neutrophils Relative %: 60.6 % (ref 43.0–77.0)
Platelets: 277 10*3/uL (ref 150.0–400.0)
RBC: 4.14 Mil/uL — ABNORMAL LOW (ref 4.22–5.81)
RDW: 14.5 % (ref 11.5–15.5)
WBC: 12.6 10*3/uL — ABNORMAL HIGH (ref 4.0–10.5)

## 2023-03-14 LAB — T4, FREE: Free T4: 0.91 ng/dL (ref 0.60–1.60)

## 2023-03-14 LAB — URINALYSIS
Bilirubin Urine: NEGATIVE
Hgb urine dipstick: NEGATIVE
Ketones, ur: NEGATIVE
Leukocytes,Ua: NEGATIVE
Nitrite: NEGATIVE
Specific Gravity, Urine: 1.015 (ref 1.000–1.030)
Total Protein, Urine: NEGATIVE
Urine Glucose: NEGATIVE
Urobilinogen, UA: 0.2 (ref 0.0–1.0)
pH: 6 (ref 5.0–8.0)

## 2023-03-14 LAB — TESTOSTERONE: Testosterone: 403.71 ng/dL (ref 300.00–890.00)

## 2023-03-14 LAB — LIPID PANEL
Cholesterol: 191 mg/dL (ref 0–200)
HDL: 25.8 mg/dL — ABNORMAL LOW (ref >39.00)
NonHDL: 165
Total CHOL/HDL Ratio: 7
Triglycerides: 217 mg/dL — ABNORMAL HIGH (ref 0.0–149.0)
VLDL: 43.4 mg/dL — ABNORMAL HIGH (ref 0.0–40.0)

## 2023-03-14 LAB — TSH: TSH: 1.1 u[IU]/mL (ref 0.35–5.50)

## 2023-03-14 LAB — LDL CHOLESTEROL, DIRECT: Direct LDL: 124 mg/dL

## 2023-03-14 LAB — VITAMIN B12: Vitamin B-12: >1500 pg/mL — ABNORMAL HIGH (ref 211–911)

## 2023-03-14 LAB — VITAMIN D 25 HYDROXY (VIT D DEFICIENCY, FRACTURES): VITD: 49.49 ng/mL (ref 30.00–100.00)

## 2023-03-14 LAB — HEMOGLOBIN A1C: Hgb A1c MFr Bld: 6.4 % (ref 4.6–6.5)

## 2023-03-14 MED ORDER — METOPROLOL SUCCINATE ER 50 MG PO TB24: 50.0000 mg | ORAL_TABLET | Freq: Every day | ORAL | 3 refills | Status: DC

## 2023-03-14 MED ORDER — METHYLPREDNISOLONE 4 MG PO TBPK: ORAL_TABLET | ORAL | 0 refills | Status: DC

## 2023-03-14 MED ORDER — METRONIDAZOLE 0.75 % EX CREA: TOPICAL_CREAM | Freq: Two times a day (BID) | CUTANEOUS | 3 refills | Status: AC

## 2023-03-14 MED ORDER — DOXYCYCLINE HYCLATE 100 MG PO TABS: 100.0000 mg | ORAL_TABLET | Freq: Two times a day (BID) | ORAL | 3 refills | Status: DC

## 2023-03-14 NOTE — Progress Notes (Addendum)
Subjective:  Patient ID: Douglas Christensen, male    DOB: Oct 05, 1951  Age: 72 y.o. MRN: [id number]  CC: No chief complaint on file.   HPI RIK BERKELEY presents for a well exam, CRI C/o fatigue. No sun exposure C/o rash on the chest and neck C/o skin lesions  Outpatient Medications Prior to Visit  Medication Sig Dispense Refill   acetaminophen (TYLENOL) 650 MG CR tablet Take 1,300 mg by mouth every 8 (eight) hours as needed for pain.     Ascorbic Acid (VITAMIN C PO) Take 1 tablet by mouth in the morning.     aspirin EC 81 MG tablet Take 81 mg by mouth every other day. Swallow whole.     benzonatate (TESSALON) 100 MG capsule Take 1 capsule (100 mg total) by mouth 3 (three) times daily as needed for cough. 30 capsule 0   cholecalciferol (VITAMIN D) 1000 units tablet Take 1,000 Units by mouth daily.      losartan (COZAAR) 50 MG tablet Take 1.5 tablets (75 mg total) by mouth as directed. TAKE ONE TABLET BY MOUTH EVERY MORNING, AND TAKE 1/2 TABLET EVERY IN THE EVENING 135 tablet 3   nitroGLYCERIN (NITROSTAT) 0.4 MG SL tablet Place 1 tablet (0.4 mg total) under the tongue every 5 (five) minutes as needed for chest pain. 25 tablet 11   rosuvastatin (CRESTOR) 40 MG tablet Take 1 tablet (40 mg total) by mouth 3 (three) times a week. . 12 tablet 3   tadalafil, PAH, (ADCIRCA) 20 MG tablet Take 20 mg by mouth every 3 (three) days.     vitamin B-12 (CYANOCOBALAMIN) 1000 MCG tablet Take 1,000 mcg by mouth daily.     clonazePAM (KLONOPIN) 0.5 MG tablet TAKE ONE TABLET BY MOUTH TWICE DAILY AS NEEDED FOR ANXIETY 60 tablet 3   metoprolol succinate (TOPROL-XL) 50 MG 24 hr tablet Take 1.5 tablets (75 mg total) by mouth 2 (two) times daily. Take with or immediately following a meal. 270 tablet 3   tadalafil (CIALIS) 20 MG tablet TAKE ONE TABLET BY MOUTH EVERY THREE DAYS AS NEEDED FOR erectile dysfunction 30 tablet 3   cyclobenzaprine (FLEXERIL) 10 MG tablet Take 1 tablet (10 mg total) by mouth 3 (three) times daily as  needed for muscle spasms. (Patient not taking: Reported on 03/14/2023) 60 tablet 1   traMADol (ULTRAM) 50 MG tablet Take 1 tablet (50 mg total) by mouth every 6 (six) hours as needed. (Patient not taking: Reported on 03/14/2023) 120 tablet 5   triamterene-hydrochlorothiazide (MAXZIDE-25) 37.5-25 MG tablet Take 2 tablets by mouth daily. 180 tablet 3   No facility-administered medications prior to visit.    ROS: Review of Systems  Constitutional:  Positive for fatigue. Negative for appetite change and unexpected weight change.  HENT:  Negative for congestion, nosebleeds, sneezing, sore throat and trouble swallowing.   Eyes:  Negative for itching and visual disturbance.  Respiratory:  Negative for cough.   Cardiovascular:  Negative for chest pain, palpitations and leg swelling.  Gastrointestinal:  Negative for abdominal distention, blood in stool, diarrhea and nausea.  Genitourinary:  Negative for frequency and hematuria.  Musculoskeletal:  Negative for back pain, gait problem, joint swelling and neck pain.  Skin:  Positive for rash.  Neurological:  Negative for dizziness, tremors, speech difficulty and weakness.  Psychiatric/Behavioral:  Negative for agitation, dysphoric mood and sleep disturbance. The patient is not nervous/anxious.     Objective:  BP 122/68 (BP Location: Left Arm, Patient Position: Sitting, Cuff  Size: Normal)   Pulse (!) 59   Temp 97.7 F (36.5 C) (Oral)   Ht 5\' 4"  (1.626 m)   Wt 160 lb (72.6 kg)   SpO2 99%   BMI 27.46 kg/m   BP Readings from Last 3 Encounters:  03/14/23 122/68  01/31/23 124/70  08/24/22 122/84    Wt Readings from Last 3 Encounters:  03/14/23 160 lb (72.6 kg)  01/31/23 168 lb 6.4 oz (76.4 kg)  09/19/22 178 lb (80.7 kg)    Physical Exam Constitutional:      General: He is not in acute distress.    Appearance: He is well-developed. He is obese.     Comments: NAD  Eyes:     Conjunctiva/sclera: Conjunctivae normal.     Pupils: Pupils are  equal, round, and reactive to light.  Neck:     Thyroid: No thyromegaly.     Vascular: No JVD.  Cardiovascular:     Rate and Rhythm: Normal rate and regular rhythm.     Heart sounds: Normal heart sounds. No murmur heard.    No friction rub. No gallop.  Pulmonary:     Effort: Pulmonary effort is normal. No respiratory distress.     Breath sounds: Normal breath sounds. No wheezing or rales.  Chest:     Chest wall: No tenderness.  Abdominal:     General: Bowel sounds are normal. There is no distension.     Palpations: Abdomen is soft. There is no mass.     Tenderness: There is no abdominal tenderness. There is no guarding or rebound.  Musculoskeletal:        General: No tenderness. Normal range of motion.     Cervical back: Normal range of motion.  Lymphadenopathy:     Cervical: No cervical adenopathy.  Skin:    General: Skin is warm and dry.     Findings: Bruising, lesion and rash present.  Neurological:     Mental Status: He is alert and oriented to person, place, and time.     Cranial Nerves: No cranial nerve deficit.     Motor: No abnormal muscle tone.     Coordination: Coordination normal.     Gait: Gait normal.     Deep Tendon Reflexes: Reflexes are normal and symmetric.  Psychiatric:        Behavior: Behavior normal.        Thought Content: Thought content normal.        Judgment: Judgment normal.   Exudate type rash on face and chest Ruddy  complexion AK's on skin    Procedure Note :     Procedure : Cryosurgery   Indication:  Actinic keratosis(es)   Risks including unsuccessful procedure , bleeding, infection, bruising, scar, a need for a repeat  procedure and others were explained to the patient in detail as well as the benefits. Informed consent was obtained verbally.    5 lesion(s)  on  B hands and B LEs  was/were treated with liquid nitrogen on a Q-tip in a usual fasion . Band-Aid was applied and antibiotic ointment was given for a later use.   Tolerated  well. Complications none.   Postprocedure instructions :     Keep the wounds clean. You can wash them with liquid soap and water. Pat dry with gauze or a Kleenex tissue  Before applying antibiotic ointment and a Band-Aid.   You need to report immediately  if  any signs of infection develop.     A  total time of 45 minutes (excluding the skin procedure) was spent preparing to see the patient, reviewing tests, x-rays, operative reports and other medical records.  Also, obtaining history and performing comprehensive physical exam.  Additionally, counseling the patient regarding the above listed issues OSA, CFS, rosacea, COPD.   Finally, documenting clinical information in the health records, coordination of care, educating the patient. It is a complex case.   Lab Results  Component Value Date   WBC 12.6 (H) 03/14/2023   HGB 13.6 03/14/2023   HCT 40.4 03/14/2023   PLT 277.0 03/14/2023   GLUCOSE 85 03/14/2023   CHOL 191 03/14/2023   TRIG 217.0 (H) 03/14/2023   HDL 25.80 (L) 03/14/2023   LDLDIRECT 124.0 03/14/2023   LDLCALC 46 10/05/2020   ALT 8 03/14/2023   AST 11 03/14/2023   NA 139 03/14/2023   K 3.8 03/14/2023   CL 109 03/14/2023   CREATININE 1.65 (H) 03/14/2023   BUN 53 (H) 03/14/2023   CO2 21 03/14/2023   TSH 1.10 03/14/2023   PSA 0.86 10/05/2020   INR 1.10 06/22/2015   HGBA1C 6.4 03/14/2023    PERIPHERAL VASCULAR CATHETERIZATION  Result Date: 11/02/2021 Patient name: TAWAN TABET MRN: [id number] DOB: September 21, 1951 Sex: male 11/02/2021 Pre-operative Diagnosis: Left leg pain Post-operative diagnosis:  Same Surgeon:  Durene Cal Procedure Performed:  1.  Ultrasound-guided access, left femoral artery  2.  Abdominal aortogram with CO2  3.  Bilateral lower extremity runoff  4.  Conscious sedation, 36 minutes Indications: This is a 72 year old gentleman with multiple prior vascular interventions who has been having severe left back hip and foot pain.  He had a CT scan that suggested left  iliac stenosis.  He is here for further evaluation Procedure:  The patient was identified in the holding area and taken to room 8.  The patient was then placed supine on the table and prepped and draped in the usual sterile fashion.  A time out was called.  Conscious sedation was administered with the use of IV fentanyl and Versed under continuous physician and nurse monitoring.  Heart rate, blood pressure, and oxygen saturation were continuously monitored.  Total sedation time was 36 minutes.  Ultrasound was used to evaluate the right common femoral artery.  It was patent .  A digital ultrasound image was acquired.  A micropuncture needle was used to access the right common femoral artery under ultrasound guidance.  An 018 wire was advanced without resistance and a micropuncture sheath was placed.  The 018 wire was removed and a benson wire was placed.  The micropuncture sheath was exchanged for a 5 french sheath.  An omniflush catheter was advanced over the wire to the level of L-1.  An abdominal angiogram with CO2 was obtained.  Next, the cath was pulled out of the aortic bifurcation and pelvic imaging was obtained followed additional CO2 injections to evaluate the leg. Findings:  Aortogram: No significant renal artery stenosis was identified.  The infrarenal abdominal aorta is heavily calcified without significant stenosis.  Stents within the right common and external iliac artery are widely patent.  The stent within the left common and external iliac artery are widely patent.  The left hypogastric artery is occluded.  Right Lower Extremity: There is patulous dilatation of the right common femoral artery consistent with prior endarterectomy with patch angioplasty.  Bypass graft is visualized off of the common femoral artery down to the popliteal artery.  No obvious stenosis was identified.  Left Lower Extremity:  The left common femoral artery is widely patent.  It is consistent with prior patch angioplasty.   Stents within the left superficial femoral artery are patent without hemodynamically significant stenosis however there is some luminal narrowing within the midportion of the superficial femoral artery.  The popliteal artery is widely patent.  Runoff was difficult to visualize secondary to timing of contrast however he does have 3 vessels proximally and the posterior tibial was visualized crossing the ankle. Intervention: None Impression:  #1  The iliac artery of concern on the left visualized on CT scan does not show any significant stenosis.  #2  Patent right femoral endarterectomy and femoral-popliteal bypass graft  #3  Patent left femoral endarterectomy without stenosis.  There is mild narrowing within the proximal to midportion of the left superficial femoral artery stents V. Durene Cal, M.D., [facility] [place] Office: [phone] Pager:  [phone]    Assessment & Plan:   Problem List Items Addressed This Visit     Polycythemia, secondary (Chronic)    Due to previous history of smoking and sleep apnea.   Follow-up with Dr Anda Latina, NP CBC, iron tests      Relevant Orders   CBC with Differential/Platelet (Completed)   Comprehensive metabolic panel (Completed)   Iron, TIBC and Ferritin Panel (Completed)   TSH (Completed)   T4, free (Completed)   Urinalysis (Completed)   Lipid panel (Completed)   Hemoglobin A1c (Completed)   Testosterone (Completed)   Vitamin B12 (Completed)   VITAMIN D 25 Hydroxy (Vit-D Deficiency, Fractures) (Completed)   Vitamin D deficiency    On Vit D      Relevant Orders   VITAMIN D 25 Hydroxy (Vit-D Deficiency, Fractures) (Completed)   B12 deficiency    On B12      Relevant Orders   Vitamin B12 (Completed)   COPD exacerbation (HCC)    Start Doxy po , Medrol dosepak      Relevant Medications   methylPREDNISolone (MEDROL DOSEPAK) 4 MG TBPK tablet   OSA on CPAP - Primary    On CPAP       Actinic keratoses   Rosacea    Worse Doxy po External cream      Relevant Orders   CBC with Differential/Platelet (Completed)   CRI (chronic renal insufficiency), stage 4 (severe) (HCC)    Monitor GFR Reduced Maxzide to 1 a day      Relevant Orders   LDL cholesterol, direct (Completed)      Meds ordered this encounter  Medications   metoprolol succinate (TOPROL-XL) 50 MG 24 hr tablet    Sig: Take 1 tablet (50 mg total) by mouth at bedtime. Take with or immediately following a meal.    Dispense:  90 tablet    Refill:  3    THIS TAKES PLACE OF THE ONE SENT EARLIER FOR 75 MG DAILY #270, SHOULD BEEN 75 MG BID   doxycycline (VIBRA-TABS) 100 MG tablet    Sig: Take 1 tablet (100 mg total) by mouth 2 (two) times daily.    Dispense:  60 tablet    Refill:  3   metroNIDAZOLE (METROCREAM) 0.75 % cream    Sig: Apply topically 2 (two) times daily.    Dispense:  45 g    Refill:  3   methylPREDNISolone (MEDROL DOSEPAK) 4 MG TBPK tablet    Sig: As directed    Dispense:  21 tablet    Refill:  0  Follow-up: Return in about 6 weeks (around 04/25/2023) for a follow-up visit.  Sonda Primes, MD

## 2023-03-14 NOTE — Assessment & Plan Note (Addendum)
Due to previous history of smoking and sleep apnea.   Follow-up with Dr Anda Latina, NP CBC, iron tests

## 2023-03-14 NOTE — Assessment & Plan Note (Signed)
Monitor GFR Reduced Maxzide to 1 a day

## 2023-03-14 NOTE — Assessment & Plan Note (Signed)
On B12 

## 2023-03-14 NOTE — Assessment & Plan Note (Signed)
On Vit D 

## 2023-03-14 NOTE — Assessment & Plan Note (Signed)
On CPAP. ?

## 2023-03-14 NOTE — Patient Instructions (Addendum)
Stop Triamt/HCT  Take Metoprolol one at bedtime  Postprocedure instructions :     Keep the wounds clean. You can wash them with liquid soap and water. Pat dry with gauze or a Kleenex tissue  Before applying antibiotic ointment and a Band-Aid.

## 2023-03-14 NOTE — Assessment & Plan Note (Addendum)
Start Doxy po , Medrol dosepak

## 2023-03-14 NOTE — Assessment & Plan Note (Signed)
Worse Doxy po External cream

## 2023-03-15 LAB — IRON,TIBC AND FERRITIN PANEL
%SAT: 22 % (calc) (ref 20–48)
Ferritin: 229 ng/mL (ref 24–380)
Iron: 63 ug/dL (ref 50–180)
TIBC: 290 mcg/dL (calc) (ref 250–425)

## 2023-03-20 ENCOUNTER — Other Ambulatory Visit: Payer: Self-pay | Admitting: Internal Medicine

## 2023-03-27 DIAGNOSIS — M25539 Pain in unspecified wrist: Secondary | ICD-10-CM | POA: Insufficient documentation

## 2023-03-28 ENCOUNTER — Ambulatory Visit (INDEPENDENT_AMBULATORY_CARE_PROVIDER_SITE_OTHER): Payer: Medicare HMO

## 2023-03-28 DIAGNOSIS — I440 Atrioventricular block, first degree: Secondary | ICD-10-CM

## 2023-03-29 LAB — CUP PACEART REMOTE DEVICE CHECK
Battery Remaining Longevity: 100 mo
Battery Voltage: 3 V
Brady Statistic AP VP Percent: 42.7 %
Brady Statistic AP VS Percent: 0.01 %
Brady Statistic AS VP Percent: 54.7 %
Brady Statistic AS VS Percent: 2.59 %
Brady Statistic RA Percent Paced: 43.99 %
Brady Statistic RV Percent Paced: 97.4 %
Date Time Interrogation Session: 20240429190200
Implantable Lead Connection Status: 753985
Implantable Lead Connection Status: 753985
Implantable Lead Implant Date: 20201231
Implantable Lead Implant Date: 20201231
Implantable Lead Location: 753859
Implantable Lead Location: 753860
Implantable Lead Model: 3830
Implantable Lead Model: 5076
Implantable Pulse Generator Implant Date: 20201231
Lead Channel Impedance Value: 323 Ohm
Lead Channel Impedance Value: 380 Ohm
Lead Channel Impedance Value: 475 Ohm
Lead Channel Impedance Value: 513 Ohm
Lead Channel Pacing Threshold Amplitude: 0.75 V
Lead Channel Pacing Threshold Amplitude: 0.875 V
Lead Channel Pacing Threshold Pulse Width: 0.4 ms
Lead Channel Pacing Threshold Pulse Width: 0.4 ms
Lead Channel Sensing Intrinsic Amplitude: 5.375 mV
Lead Channel Sensing Intrinsic Amplitude: 5.375 mV
Lead Channel Sensing Intrinsic Amplitude: 6.875 mV
Lead Channel Sensing Intrinsic Amplitude: 6.875 mV
Lead Channel Setting Pacing Amplitude: 1.5 V
Lead Channel Setting Pacing Amplitude: 2.5 V
Lead Channel Setting Pacing Pulse Width: 0.4 ms
Lead Channel Setting Sensing Sensitivity: 1.2 mV
Zone Setting Status: 755011
Zone Setting Status: 755011

## 2023-04-14 NOTE — Addendum Note (Signed)
Addended by: Tresa Garter on: 04/14/2023 09:57 AM   Modules accepted: Level of Service

## 2023-04-18 ENCOUNTER — Encounter: Payer: Self-pay | Admitting: Internal Medicine

## 2023-04-18 ENCOUNTER — Telehealth (INDEPENDENT_AMBULATORY_CARE_PROVIDER_SITE_OTHER): Payer: Medicare HMO | Admitting: Internal Medicine

## 2023-04-18 DIAGNOSIS — R600 Localized edema: Secondary | ICD-10-CM

## 2023-04-18 DIAGNOSIS — N1832 Chronic kidney disease, stage 3b: Secondary | ICD-10-CM

## 2023-04-18 DIAGNOSIS — L719 Rosacea, unspecified: Secondary | ICD-10-CM

## 2023-04-18 MED ORDER — FUROSEMIDE 40 MG PO TABS: 20.0000 mg | ORAL_TABLET | Freq: Every day | ORAL | 3 refills | Status: DC | PRN

## 2023-04-18 NOTE — Progress Notes (Signed)
Virtual Visit via Video Note  I connected with Caroll Rancher on 04/18/23 at 10:40 AM EDT by a video enabled telemedicine application and verified that I am speaking with the correct person using two identifiers.   I discussed the limitations of evaluation and management by telemedicine and the availability of in person appointments. The patient expressed understanding and agreed to proceed.  I was located at our [facility] office. The patient was at home. There was no one else present in the visit.  Chief Complaint  Patient presents with   Medication Management     History of Present Illness:  Douglas Christensen presents for swollen legs at night x 2 weeks F/u on rash - better F/u on CRI Review of Systems  Respiratory:  Negative for cough and shortness of breath.   Cardiovascular:  Positive for leg swelling. Negative for orthopnea.  Skin:  Negative for rash.  Edema 1+ B LEs   Observations/Objective: The patient appears to be in no acute distress  Assessment and Plan:  Problem List Items Addressed This Visit     Edema - Primary    Relapsed B legs NAS diet Elevate feet He can't wear compression socks Furosemide prn      Rosacea    Much better      Stage 3b chronic kidney disease (HCC)    Hydrate well        Meds ordered this encounter  Medications   furosemide (LASIX) 40 MG tablet    Sig: Take 0.5-1 tablets (20-40 mg total) by mouth daily as needed for edema.    Dispense:  30 tablet    Refill:  3     Follow Up Instructions:    I discussed the assessment and treatment plan with the patient. The patient was provided an opportunity to ask questions and all were answered. The patient agreed with the plan and demonstrated an understanding of the instructions.   The patient was advised to call back or seek an in-person evaluation if the symptoms worsen or if the condition fails to improve as anticipated.  I provided face-to-face time during this encounter. We were  at different locations.   Sonda Primes, MD

## 2023-04-18 NOTE — Assessment & Plan Note (Signed)
Relapsed B legs NAS diet Elevate feet He can't wear compression socks Furosemide prn

## 2023-04-18 NOTE — Progress Notes (Deleted)
Subjective:  Patient ID: Douglas Christensen, male    DOB: [birth year]  Age: 72 y.o. MRN: [id number]  CC: Medication Management   HPI Douglas Christensen presents for swollen legs at night x 2 weeks F/u on rash - better F/u on CRI  Outpatient Medications Prior to Visit  Medication Sig Dispense Refill   acetaminophen (TYLENOL) 650 MG CR tablet Take 1,300 mg by mouth every 8 (eight) hours as needed for pain.     Ascorbic Acid (VITAMIN C PO) Take 1 tablet by mouth in the morning.     aspirin EC 81 MG tablet Take 81 mg by mouth every other day. Swallow whole.     cholecalciferol (VITAMIN D) 1000 units tablet Take 1,000 Units by mouth daily.      clonazePAM (KLONOPIN) 0.5 MG tablet TAKE ONE TABLET BY MOUTH TWICE DAILY AS NEEDED FOR ANXIETY 60 tablet 3   doxycycline (VIBRA-TABS) 100 MG tablet Take 1 tablet (100 mg total) by mouth 2 (two) times daily. 60 tablet 3   losartan (COZAAR) 50 MG tablet Take 1.5 tablets (75 mg total) by mouth as directed. TAKE ONE TABLET BY MOUTH EVERY MORNING, AND TAKE 1/2 TABLET EVERY IN THE EVENING 135 tablet 3   metoprolol succinate (TOPROL-XL) 50 MG 24 hr tablet Take 1 tablet (50 mg total) by mouth at bedtime. Take with or immediately following a meal. 90 tablet 3   metroNIDAZOLE (METROCREAM) 0.75 % cream Apply topically 2 (two) times daily. 45 g 3   nitroGLYCERIN (NITROSTAT) 0.4 MG SL tablet Place 1 tablet (0.4 mg total) under the tongue every 5 (five) minutes as needed for chest pain. 25 tablet 11   rosuvastatin (CRESTOR) 40 MG tablet Take 1 tablet (40 mg total) by mouth 3 (three) times a week. . 12 tablet 3   tadalafil, PAH, (ADCIRCA) 20 MG tablet Take 20 mg by mouth every 3 (three) days.     vitamin B-12 (CYANOCOBALAMIN) 1000 MCG tablet Take 1,000 mcg by mouth daily.     cyclobenzaprine (FLEXERIL) 10 MG tablet Take 1 tablet (10 mg total) by mouth 3 (three) times daily as needed for muscle spasms. (Patient not taking: Reported on 03/14/2023) 60 tablet 1   traMADol (ULTRAM) 50 MG  tablet Take 1 tablet (50 mg total) by mouth every 6 (six) hours as needed. (Patient not taking: Reported on 03/14/2023) 120 tablet 5   benzonatate (TESSALON) 100 MG capsule Take 1 capsule (100 mg total) by mouth 3 (three) times daily as needed for cough. 30 capsule 0   methylPREDNISolone (MEDROL DOSEPAK) 4 MG TBPK tablet As directed 21 tablet 0   No facility-administered medications prior to visit.    ROS: Review of Systems  Objective:  There were no vitals taken for this visit.  BP Readings from Last 3 Encounters:  03/14/23 122/68  01/31/23 124/70  08/24/22 122/84    Wt Readings from Last 3 Encounters:  03/14/23 160 lb (72.6 kg)  01/31/23 168 lb 6.4 oz (76.4 kg)  09/19/22 178 lb (80.7 kg)    Physical Exam  Lab Results  Component Value Date   WBC 12.6 (H) 03/14/2023   HGB 13.6 03/14/2023   HCT 40.4 03/14/2023   PLT 277.0 03/14/2023   GLUCOSE 85 03/14/2023   CHOL 191 03/14/2023   TRIG 217.0 (H) 03/14/2023   HDL 25.80 (L) 03/14/2023   LDLDIRECT 124.0 03/14/2023   LDLCALC 46 10/05/2020   ALT 8 03/14/2023   AST 11 03/14/2023   NA 139  03/14/2023   K 3.8 03/14/2023   CL 109 03/14/2023   CREATININE 1.65 (H) 03/14/2023   BUN 53 (H) 03/14/2023   CO2 21 03/14/2023   TSH 1.10 03/14/2023   PSA 0.86 10/05/2020   INR 1.10 06/22/2015   HGBA1C 6.4 03/14/2023    PERIPHERAL VASCULAR CATHETERIZATION  Result Date: 11/02/2021 Patient name: Douglas Christensen MRN: [id number] DOB: Jan 18, 1951 Sex: male 11/02/2021 Pre-operative Diagnosis: Left leg pain Post-operative diagnosis:  Same Surgeon:  Durene Cal Procedure Performed:  1.  Ultrasound-guided access, left femoral artery  2.  Abdominal aortogram with CO2  3.  Bilateral lower extremity runoff  4.  Conscious sedation, 36 minutes Indications: This is a 72 year old gentleman with multiple prior vascular interventions who has been having severe left back hip and foot pain.  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  He is here for  further evaluation Procedure:  The patient was identified in the holding area and taken to room 8.  The patient was then placed supine on the table and prepped and draped in the usual sterile fashion.  A time out was called.  Conscious sedation was administered with the use of IV fentanyl and Versed under continuous physician and nurse monitoring.  Heart rate, blood pressure, and oxygen saturation were continuously monitored.  Total sedation time was 36 minutes.  Ultrasound was used to evaluate the right common femoral artery.  It was patent .  A digital ultrasound image was acquired.  A micropuncture needle was used to access the right common femoral artery under ultrasound guidance.  An 018 wire was advanced without resistance and a micropuncture sheath was placed.  The 018 wire was removed and a benson wire was placed.  The micropuncture sheath was exchanged for a 5 french sheath.  An omniflush catheter was advanced over the wire to the level of L-1.  An abdominal angiogram with CO2 was obtained.  Next, the cath was pulled out of the aortic bifurcation and pelvic imaging was obtained followed additional CO2 injections to evaluate the leg. Findings:  Aortogram: No significant renal artery stenosis was identified.  The infrarenal abdominal aorta is heavily calcified without significant stenosis.  Stents within the right common and external iliac artery are widely patent.  The stent within the left common and external iliac artery are widely patent.  The left hypogastric artery is occluded.  Right Lower Extremity: There is patulous dilatation of the right common femoral artery consistent with prior endarterectomy with patch angioplasty.  Bypass graft is visualized off of the common femoral artery down to the popliteal artery.  No obvious stenosis was identified.  Left Lower Extremity: The left common femoral artery is widely patent.  It is consistent with prior patch angioplasty.  Stents within the left superficial  femoral artery are patent without hemodynamically significant stenosis however there is some luminal narrowing within the midportion of the superficial femoral artery.  The popliteal artery is widely patent.  Runoff was difficult to visualize secondary to timing of contrast however he does have 3 vessels proximally and the posterior tibial was visualized crossing the ankle. Intervention: None Impression:  #1  The iliac artery of concern on the left visualized on CT scan does not show any significant stenosis.  #2  Patent right femoral endarterectomy and femoral-popliteal bypass graft  #3  Patent left femoral endarterectomy without stenosis.  There is mild narrowing within the proximal to midportion of the left superficial femoral artery stents V. Durene Cal, M.D.,  [facility] [place] Office: [phone] Pager:  [phone]    Assessment & Plan:   Problem List Items Addressed This Visit   None     Meds ordered this encounter  Medications   furosemide (LASIX) 40 MG tablet    Sig: Take 0.5-1 tablets (20-40 mg total) by mouth daily as needed for edema.    Dispense:  30 tablet    Refill:  3      Follow-up: No follow-ups on file.  Sonda Primes, MD

## 2023-04-18 NOTE — Assessment & Plan Note (Signed)
Much better 

## 2023-04-18 NOTE — Assessment & Plan Note (Signed)
Hydrate well 

## 2023-04-19 NOTE — Progress Notes (Signed)
Remote pacemaker transmission.   

## 2023-05-11 ENCOUNTER — Telehealth: Payer: Self-pay

## 2023-05-11 ENCOUNTER — Ambulatory Visit (INDEPENDENT_AMBULATORY_CARE_PROVIDER_SITE_OTHER): Payer: Medicare HMO

## 2023-05-11 VITALS — Ht 64.0 in | Wt 162.0 lb

## 2023-05-11 DIAGNOSIS — Z0000 Encounter for general adult medical examination without abnormal findings: Secondary | ICD-10-CM

## 2023-05-11 NOTE — Telephone Encounter (Signed)
Patient stated that he is still having a lot of edema in his feet.  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  Any suggestions?

## 2023-05-11 NOTE — Patient Instructions (Addendum)
Douglas Christensen , Thank you for taking time to come for your Medicare Wellness Visit. I appreciate your ongoing commitment to your health goals. Please review the following plan we discussed and let me know if I can assist you in the future.   These are the goals we discussed:  Goals      My goal is to wake up every morning ad stay active.        This is a list of the screening recommended for you and due dates:  Health Maintenance  Topic Date Due   Pneumonia Vaccine (3 of 3 - PPSV23 or PCV20) 01/28/2019   Colon Cancer Screening  03/21/2021   COVID-19 Vaccine (4 - 2023-24 season) 07/29/2022   Screening for Lung Cancer  09/26/2022   Flu Shot  06/29/2023   Medicare Annual Wellness Visit  05/10/2024   DTaP/Tdap/Td vaccine (2 - Td or Tdap) 03/02/2027   Hepatitis C Screening  Completed   HPV Vaccine  Aged Out   Zoster (Shingles) Vaccine  Discontinued    Advanced directives: No  Conditions/risks identified: Yes  Next appointment: Follow up in one year for your annual wellness visit via telephone call with Nurse Percell Miller on 05/20/2024 at 4:00 p.m.  If you need to cancel or reschedule please call [phone].  Preventive Care 72 Years and Older, Male  Preventive care refers to lifestyle choices and visits with your health care provider that can promote health and wellness. What does preventive care include? A yearly physical exam. This is also called an annual well check. Dental exams once or twice a year. Routine eye exams. Ask your health care provider how often you should have your eyes checked. Personal lifestyle choices, including: Daily care of your teeth and gums. Regular physical activity. Eating a healthy diet. Avoiding tobacco and drug use. Limiting alcohol use. Practicing safe sex. Taking low doses of aspirin every day. Taking vitamin and mineral supplements as recommended by your health care provider. What happens during an annual well check? The services and screenings  done by your health care provider during your annual well check will depend on your age, overall health, lifestyle risk factors, and family history of disease. Counseling  Your health care provider may ask you questions about your: Alcohol use. Tobacco use. Drug use. Emotional well-being. Home and relationship well-being. Sexual activity. Eating habits. History of falls. Memory and ability to understand (cognition). Work and work Astronomer. Screening  You may have the following tests or measurements: Height, weight, and BMI. Blood pressure. Lipid and cholesterol levels. These may be checked every 5 years, or more frequently if you are over 90 years old. Skin check. Lung cancer screening. You may have this screening every year starting at age 4 if you have a 30-pack-year history of smoking and currently smoke or have quit within the past 15 years. Fecal occult blood test (FOBT) of the stool. You may have this test every year starting at age 24. Flexible sigmoidoscopy or colonoscopy. You may have a sigmoidoscopy every 5 years or a colonoscopy every 10 years starting at age 53. Prostate cancer screening. Recommendations will vary depending on your family history and other risks. Hepatitis C blood test. Hepatitis B blood test. Sexually transmitted disease (STD) testing. Diabetes screening. This is done by checking your blood sugar (glucose) after you have not eaten for a while (fasting). You may have this done every 1-3 years. Abdominal aortic aneurysm (AAA) screening. You may need this if you are a current  or former smoker. Osteoporosis. You may be screened starting at age 66 if you are at high risk. Talk with your health care provider about your test results, treatment options, and if necessary, the need for more tests. Vaccines  Your health care provider may recommend certain vaccines, such as: Influenza vaccine. This is recommended every year. Tetanus, diphtheria, and acellular  pertussis (Tdap, Td) vaccine. You may need a Td booster every 10 years. Zoster vaccine. You may need this after age 29. Pneumococcal 13-valent conjugate (PCV13) vaccine. One dose is recommended after age 73. Pneumococcal polysaccharide (PPSV23) vaccine. One dose is recommended after age 7. Talk to your health care provider about which screenings and vaccines you need and how often you need them. This information is not intended to replace advice given to you by your health care provider. Make sure you discuss any questions you have with your health care provider. Document Released: 12/11/2015 Document Revised: 08/03/2016 Document Reviewed: 09/15/2015 Elsevier Interactive Patient Education  2017 [employer].  Fall Prevention in the Home Falls can cause injuries. They can happen to people of all ages. There are many things you can do to make your home safe and to help prevent falls. What can I do on the outside of my home? Regularly fix the edges of walkways and driveways and fix any cracks. Remove anything that might make you trip as you walk through a door, such as a raised step or threshold. Trim any bushes or trees on the path to your home. Use bright outdoor lighting. Clear any walking paths of anything that might make someone trip, such as rocks or tools. Regularly check to see if handrails are loose or broken. Make sure that both sides of any steps have handrails. Any raised decks and porches should have guardrails on the edges. Have any leaves, snow, or ice cleared regularly. Use sand or salt on walking paths during winter. Clean up any spills in your garage right away. This includes oil or grease spills. What can I do in the bathroom? Use night lights. Install grab bars by the toilet and in the tub and shower. Do not use towel bars as grab bars. Use non-skid mats or decals in the tub or shower. If you need to sit down in the shower, use a plastic, non-slip stool. Keep the floor  dry. Clean up any water that spills on the floor as soon as it happens. Remove soap buildup in the tub or shower regularly. Attach bath mats securely with double-sided non-slip rug tape. Do not have throw rugs and other things on the floor that can make you trip. What can I do in the bedroom? Use night lights. Make sure that you have a light by your bed that is easy to reach. Do not use any sheets or blankets that are too big for your bed. They should not hang down onto the floor. Have a firm chair that has side arms. You can use this for support while you get dressed. Do not have throw rugs and other things on the floor that can make you trip. What can I do in the kitchen? Clean up any spills right away. Avoid walking on wet floors. Keep items that you use a lot in easy-to-reach places. If you need to reach something above you, use a strong step stool that has a grab bar. Keep electrical cords out of the way. Do not use floor polish or wax that makes floors slippery. If you must use wax,  use non-skid floor wax. Do not have throw rugs and other things on the floor that can make you trip. What can I do with my stairs? Do not leave any items on the stairs. Make sure that there are handrails on both sides of the stairs and use them. Fix handrails that are broken or loose. Make sure that handrails are as long as the stairways. Check any carpeting to make sure that it is firmly attached to the stairs. Fix any carpet that is loose or worn. Avoid having throw rugs at the top or bottom of the stairs. If you do have throw rugs, attach them to the floor with carpet tape. Make sure that you have a light switch at the top of the stairs and the bottom of the stairs. If you do not have them, ask someone to add them for you. What else can I do to help prevent falls? Wear shoes that: Do not have high heels. Have rubber bottoms. Are comfortable and fit you well. Are closed at the toe. Do not wear  sandals. If you use a stepladder: Make sure that it is fully opened. Do not climb a closed stepladder. Make sure that both sides of the stepladder are locked into place. Ask someone to hold it for you, if possible. Clearly mark and make sure that you can see: Any grab bars or handrails. First and last steps. Where the edge of each step is. Use tools that help you move around (mobility aids) if they are needed. These include: Canes. Walkers. Scooters. Crutches. Turn on the lights when you go into a dark area. Replace any light bulbs as soon as they burn out. Set up your furniture so you have a clear path. Avoid moving your furniture around. If any of your floors are uneven, fix them. If there are any pets around you, be aware of where they are. Review your medicines with your doctor. Some medicines can make you feel dizzy. This can increase your chance of falling. Ask your doctor what other things that you can do to help prevent falls. This information is not intended to replace advice given to you by your health care provider. Make sure you discuss any questions you have with your health care provider. Document Released: 09/10/2009 Document Revised: 04/21/2016 Document Reviewed: 12/19/2014 Elsevier Interactive Patient Education  2017 [employer].

## 2023-05-11 NOTE — Progress Notes (Cosign Needed Addendum)
I connected with  Caroll Rancher on 05/11/23 by a audio enabled telemedicine application and verified that I am speaking with the correct person using two identifiers.  Patient Location: Home  Provider Location: Office/Clinic  I discussed the limitations of evaluation and management by telemedicine. The patient expressed understanding and agreed to proceed.  Subjective:   Douglas Christensen is a 72 y.o. male who presents for Medicare Annual/Subsequent preventive examination.  Review of Systems     Cardiac Risk Factors include: advanced age (>69men, >32 women);dyslipidemia;family history of premature cardiovascular disease;hypertension;male gender     Objective:    Today's Vitals   05/11/23 1533  Weight: 162 lb (73.5 kg)  Height: 5\' 4"  (1.626 m)  PainSc: 7   PainLoc: Foot   Body mass index is 27.81 kg/m.     05/11/2023    3:38 PM 07/26/2022    9:49 AM 05/10/2022    4:39 PM 11/02/2021    6:17 AM 09/26/2021    1:52 PM 06/25/2021    1:52 PM 12/28/2020    4:41 PM  Advanced Directives  Does Patient Have a Medical Advance Directive? No No No No No No No  Would patient like information on creating a medical advance directive? No - Patient declined No - Patient declined No - Patient declined Yes (MAU/Ambulatory/Procedural Areas - Information given) No - Patient declined No - Patient declined No - Patient declined    Current Medications (verified) Outpatient Encounter Medications as of 05/11/2023  Medication Sig   acetaminophen (TYLENOL) 650 MG CR tablet Take 1,300 mg by mouth every 8 (eight) hours as needed for pain.   Ascorbic Acid (VITAMIN C PO) Take 1 tablet by mouth in the morning.   aspirin EC 81 MG tablet Take 81 mg by mouth every other day. Swallow whole.   cholecalciferol (VITAMIN D) 1000 units tablet Take 1,000 Units by mouth daily.    clonazePAM (KLONOPIN) 0.5 MG tablet TAKE ONE TABLET BY MOUTH TWICE DAILY AS NEEDED FOR ANXIETY   cyclobenzaprine (FLEXERIL) 10 MG tablet Take 1  tablet (10 mg total) by mouth 3 (three) times daily as needed for muscle spasms. (Patient not taking: Reported on 03/14/2023)   doxycycline (VIBRA-TABS) 100 MG tablet Take 1 tablet (100 mg total) by mouth 2 (two) times daily.   furosemide (LASIX) 40 MG tablet Take 0.5-1 tablets (20-40 mg total) by mouth daily as needed for edema.   losartan (COZAAR) 50 MG tablet Take 1.5 tablets (75 mg total) by mouth as directed. TAKE ONE TABLET BY MOUTH EVERY MORNING, AND TAKE 1/2 TABLET EVERY IN THE EVENING   metoprolol succinate (TOPROL-XL) 50 MG 24 hr tablet Take 1 tablet (50 mg total) by mouth at bedtime. Take with or immediately following a meal.   metroNIDAZOLE (METROCREAM) 0.75 % cream Apply topically 2 (two) times daily.   nitroGLYCERIN (NITROSTAT) 0.4 MG SL tablet Place 1 tablet (0.4 mg total) under the tongue every 5 (five) minutes as needed for chest pain.   rosuvastatin (CRESTOR) 40 MG tablet Take 1 tablet (40 mg total) by mouth 3 (three) times a week. .   tadalafil, PAH, (ADCIRCA) 20 MG tablet Take 20 mg by mouth every 3 (three) days.   traMADol (ULTRAM) 50 MG tablet Take 1 tablet (50 mg total) by mouth every 6 (six) hours as needed. (Patient not taking: Reported on 03/14/2023)   vitamin B-12 (CYANOCOBALAMIN) 1000 MCG tablet Take 1,000 mcg by mouth daily.   No facility-administered encounter medications on file as  of 05/11/2023.    Allergies (verified) Roxicodone [oxycodone hcl], Benazepril, Gabapentin, Hydralazine, Hydrocodone, Itraconazole, Lipitor [atorvastatin calcium], and Oxycodone   History: Past Medical History:  Diagnosis Date   Anxiety    Arthritis    CAD (coronary artery disease)    Mild plaque (cath "years ago"); abnormal Myoview 04/2013 with subsequent CABG x 5 with LIMA to LAD, SVG to OM1, SVG to DX, SVG to PD & PL. // Myoview 10/23: EF 62, no ischemia or infarction; low risk   Carotid artery disease (HCC)    Carotid US 07/2019 bilateral ICA 40-59; bilateral subclavian stenosis //  Carotid US 9/21: Bilateral ICA 40-59; L VA occluded; R subclavian stenosis   Cataract    GERD (gastroesophageal reflux disease)    History of colonic polyps    History of echocardiogram    Echo 2/19: Mild LVH, EF 60-65, normal wall motion, grade 2 diastolic dysfunction, mild RAE   History of nuclear stress test    Myoview 2/19: inf/inf-lat/apical inf/ant-sept defect (?diaph atten - cannot rule out peri-infarct ischemia), PVCs/PACs/Mobitz 1 // Myoview 05/2019: EF 60, inf infarct with mild peri-infarct ischemia; no significant change when compared to prior study; Intermediate Risk   Hx of echocardiogram    Echo (9/15):  EF 55-60%; Gr 2 DD, mild BAE   Hyperlipidemia    Hypertension    LBP (low back pain)    Meralgia paresthetica of left side 2011   Polycythemia, 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  Echo 10/23: EF 55-60, no RWMA, mild LVH, normal RVSF, mild RAE, mild MR, AV sclerosis without stenosis, RAP 3    PVD (peripheral vascular disease) (HCC)    Stent to left common femoral and right superficial femoral.  2008.  50%  left renal    Second degree AV block, Mobitz type I    Holter 2/19: Sinus rhythm, average heart rate 72, frequent PVCs (burden 5%), second-degree type I AV block and periods of 2:1 heart block >> continue clinical managment and avoid AVN blocking agents   Shortness of breath    "once in awhile; can happen at anytime" (08/26/2013)   Sleep apnea    mod OSA, central sleep apnea/hypoapnea syndrome 11/22/12, CPAP every night    Subclavian artery stenosis (HCC)    Carotid US 07/2019 bilateral ICA 40-59; bilateral subclavian stenosis   Vitamin D deficiency    Past Surgical History:  Procedure Laterality Date   ABDOMINAL AORTAGRAM N/A 11/16/2011   Procedure: ABDOMINAL Ronny Flurry;  Surgeon: Tonny Bollman, MD;   Location[facility] C[facility] LAB;  Service: Cardiovascular;  Laterality: N/A;   ABDOMINAL AORTAGRAM N/A 11/14/2012   Procedure: ABDOMINAL Ronny Flurry;  Surgeon: Tonny Bollman, MD;  Location: [facility] CATH LAB;  Service: Cardiovascular;  Laterality: N/A;   ABDOMINAL AORTAGRAM N/A 12/30/2014   Procedure: ABDOMINAL Ronny Flurry;  Surgeon: Nada Libman, MD;  Location: [facility] CATH LAB;  Service: Cardiovascular;  Laterality: N/A;   ABDOMINAL AORTOGRAM W/LOWER EXTREMITY Bilateral 08/06/2019   Procedure: ABDOMINAL AORTOGRAM W/LOWER EXTREMITY;  Surgeon: Nada Libman, MD;  Location: [facility] INVASIVE CV LAB;  Service: Cardiovascular;  Laterality: Bilateral;   ABDOMINAL AORTOGRAM W/LOWER EXTREMITY Bilateral 11/02/2021   Procedure: ABDOMINAL AORTOGRAM W/LOWER EXTREMITY;  Surgeon: Nada Libman, MD;  Location: [facility] INVASIVE CV LAB;  Service: Cardiovascular;  Laterality: Bilateral;   CARDIAC CATHETERIZATION  05/02/13   x2  CORONARY ARTERY BYPASS GRAFT N/A 05/06/2013   Procedure: CORONARY ARTERY BYPASS GRAFTING (CABG);  Surgeon: Loreli Slot, MD;  Location: [facility] OR;  Service: Open Heart Surgery;  Laterality: N/A;  Coronary artery bypass graft times five using left internal mammary artery and left greater saphenous vein via endovein harvest.   ENDARTERECTOMY FEMORAL Left 01/29/2015   Procedure: ENDARTERECTOMY FEMORAL WITH PATCH ANGIOPLASTY;  Surgeon: Nada Libman, MD;  Location: [facility] OR;  Service: Vascular;  Laterality: Left;   EYE SURGERY  03/24/16   cataract surgery on left eye   FEMORAL-POPLITEAL BYPASS GRAFT  12/27/2012   Procedure: BYPASS GRAFT FEMORAL-POPLITEAL ARTERY;  Surgeon: Nada Libman, MD;  Location: [facility] OR;  Service: Vascular;  Laterality: Right;  using non-reversed sapphenous vein.   ILIAC ATHERECTOMY Left 01/29/2015   Procedure: SUPERFICIAL FEMORAL ARTERY ATHERECTOMY/PERCUTANEOUS TRANSLUMINAL ANGIOPLASTY; superficial femoral artery stent;  Surgeon: Nada Libman, MD;  Location: [facility] OR;  Service: Vascular;  Laterality:  Left;   LOWER EXTREMITY ANGIOGRAM Bilateral 11/16/2011   Procedure: LOWER EXTREMITY ANGIOGRAM;  Surgeon: Tonny Bollman, MD;  Location: [facility] CATH LAB;  Service: Cardiovascular;  Laterality: Bilateral;   lower extremity stents     bilateral lower extremities x 2   PACEMAKER IMPLANT N/A 11/28/2019   Procedure: PACEMAKER IMPLANT;  Surgeon: Hillis Range, MD;  Location: [facility] INVASIVE CV LAB;  Service: Cardiovascular;  Laterality: N/A;   PERCUTANEOUS STENT INTERVENTION Left 11/16/2011   Procedure: PERCUTANEOUS STENT INTERVENTION;  Surgeon: Tonny Bollman, MD;  Location: [facility] CATH LAB;  Service: Cardiovascular;  Laterality: Left;   PERIPHERAL VASCULAR ATHERECTOMY Right 08/06/2019   Procedure: PERIPHERAL VASCULAR ATHERECTOMY;  Surgeon: Nada Libman, MD;  Location: [facility] INVASIVE CV LAB;  Service: Cardiovascular;  Laterality: Right;  Common iliac, external iliac, and popliteal.   PERIPHERAL VASCULAR INTERVENTION Right 08/06/2019   Procedure: PERIPHERAL VASCULAR INTERVENTION;  Surgeon: Nada Libman, MD;  Location: [facility] INVASIVE CV LAB;  Service: Cardiovascular;  Laterality: Right;  common iliac, external iliac, and popliteal.   TONSILLECTOMY     TOTAL HIP ARTHROPLASTY Left 06/29/2015   Procedure: TOTAL HIP ARTHROPLASTY;  Surgeon: Gean Birchwood, MD;  Location: [facility] OR;  Service: Orthopedics;  Laterality: Left;  LEFT TOTAL HIP ARTHROPLASTY DEPUY SROM/PINNACLE   Family History  Problem Relation Age of Onset   Heart disease Mother        No clear CAD and Heart Disease before age 55   Hypertension Mother    Cancer Mother        ? colon ca   Hyperlipidemia Mother    Cancer Father        brain tumor   Alzheimer's disease Father    Colon cancer Neg Hx    Heart attack Neg Hx    Esophageal cancer Neg Hx    Liver cancer Neg Hx    Rectal cancer Neg Hx    Stomach cancer Neg Hx    Pancreatic cancer Neg Hx    Social History   Socioeconomic History   Marital status: Widowed    Spouse name: Not on file   Number  of children: 2   Years of education: Not on file   Highest education level: Not on file  Occupational History   Occupation: Detective    Employer: [employer]  Tobacco Use   Smoking status: Former    Packs/day: 2.00    Years: 47.00    Additional pack years: 0.00    Total pack years: 94.00    Types: Cigarettes  Quit date: 12/17/2012    Years since quitting: 10.4   Smokeless tobacco: Never  Vaping Use   Vaping Use: Never used  Substance and Sexual Activity   Alcohol use: Yes    Alcohol/week: 4.0 standard drinks of alcohol    Types: 4 Shots of liquor per week    Comment: 08/26/2013 "3-4 mixed drinks/wk"   Drug use: No   Sexual activity: Yes  Other Topics Concern   Not on file  Social History Narrative   Widower   Social Determinants of Health   Financial Resource Strain: Low Risk  (05/11/2023)   Overall Financial Resource Strain (CARDIA)    Difficulty of Paying Living Expenses: Not hard at all  Food Insecurity: No Food Insecurity (05/11/2023)   Hunger Vital Sign    Worried About Running Out of Food in the Last Year: Never true    Ran Out of Food in the Last Year: Never true  Transportation Needs: No Transportation Needs (05/11/2023)   PRAPARE - Administrator, Civil Service (Medical): No    Lack of Transportation (Non-Medical): No  Physical Activity: Sufficiently Active (05/11/2023)   Exercise Vital Sign    Days of Exercise per Week: 5 days    Minutes of Exercise per Session: 30 min  Stress: No Stress Concern Present (05/11/2023)   [employer] of Occupational Health - Occupational Stress Questionnaire    Feeling of Stress : Not at all  Social Connections: Socially Isolated (05/11/2023)   Social Connection and Isolation Panel [NHANES]    Frequency of Communication with Friends and Family: More than three times a week    Frequency of Social Gatherings with Friends and Family: More than three times a week    Attends Religious Services: Never    Automotive engineer or Organizations: No    Attends Banker Meetings: Never    Marital Status: Widowed    Tobacco Counseling Counseling given: Not Answered   Clinical Intake:  Pre-visit preparation completed: Yes  Pain : 0-10 Pain Score: 7  Pain Type: Neuropathic pain Pain Location: Foot Pain Orientation: Left, Right     BMI - recorded: 27.81 Nutritional Status: BMI 25 -29 Overweight Nutritional Risks: None Diabetes: No  How often do you need to have someone help you when you read instructions, pamphlets, or other written materials from your doctor or pharmacy?: 1 - Never What is the last grade level you completed in school?: HSG  Diabetic? No  Interpreter Needed?: No  Information entered by :: Susie Cassette, LPN.   Activities of Daily Living    05/11/2023    3:42 PM  In your present state of health, do you have any difficulty performing the following activities:  Hearing? 0  Vision? 0  Difficulty concentrating or making decisions? 0  Walking or climbing stairs? 0  Dressing or bathing? 0  Doing errands, shopping? 0  Preparing Food and eating ? N  Using the Toilet? N  In the past six months, have you accidently leaked urine? N  Do you have problems with loss of bowel control? N  Managing your Medications? N  Managing your Finances? N  Housekeeping or managing your Housekeeping? N    Patient Care Team: Plotnikov, Georgina Quint, MD as PCP - General (Internal Medicine) Tonny Bollman, MD as PCP - Cardiology (Cardiology) Hillis Range, MD (Inactive) as PCP - Electrophysiology (Cardiology) Gean Birchwood, MD as Consulting Physician (Orthopedic Surgery) Coralyn Helling, MD as Consulting Physician (Pulmonary Disease) Russella Dar,  Venita Lick, MD as Consulting Physician (Gastroenterology) Sallye Lat, MD as Consulting Physician (Ophthalmology) Myna Hidalgo Rose Phi, MD as Consulting Physician (Oncology)  Indicate any recent Medical Services you may have received  from other than [facility] providers in the past year (date may be approximate).     Assessment:   This is a routine wellness examination for [place].  Hearing/Vision screen Hearing Screening - Comments:: Denies hearing difficulties   Vision Screening - Comments:: Wears rx glasses - will be finding a new eye doctor in [facility]   Dietary issues and exercise activities discussed: Current Exercise Habits: Structured exercise class;Home exercise routine, Type of exercise: walking, Time (Minutes): 30, Frequency (Times/Week): 5, Weekly Exercise (Minutes/Week): 150, Intensity: Mild, Exercise limited by: cardiac condition(s);respiratory conditions(s);orthopedic condition(s);neurologic condition(s)   Goals Addressed   None   Depression Screen    05/11/2023    3:53 PM 04/18/2023   10:22 AM 03/14/2023   10:30 AM 07/25/2022    1:46 PM 05/10/2022    4:54 PM 07/06/2021    9:20 AM 07/02/2020    1:30 PM  PHQ 2/9 Scores  PHQ - 2 Score 0 3 3 0 0 0 0  PHQ- 9 Score 6 9 9 6        Fall Risk    05/11/2023    3:39 PM 04/18/2023   10:22 AM 03/14/2023   10:30 AM 07/25/2022    1:45 PM 05/10/2022    4:56 PM  Fall Risk   Falls in the past year? 0 0 0 0 0  Number falls in past yr: 0 0 0 0 0  Injury with Fall? 0 0 0 0 0  Risk for fall due to : No Fall Risks No Fall Risks No Fall Risks History of fall(s) No Fall Risks  Follow up Falls prevention discussed Falls evaluation completed Falls evaluation completed Falls evaluation completed Falls evaluation completed    FALL RISK PREVENTION PERTAINING TO THE HOME:  Any stairs in or around the home? No  If so, are there any without handrails? No  Home free of loose throw rugs in walkways, pet beds, electrical cords, etc? Yes  Adequate lighting in your home to reduce risk of falls? Yes   ASSISTIVE DEVICES UTILIZED TO PREVENT FALLS:  Life alert? No  Use of a cane, walker or w/c? No  Grab bars in the bathroom? Yes  Shower chair or bench in shower? No  Elevated toilet  seat or a handicapped toilet? No   TIMED UP AND GO:  Was the test performed? No . Telephonic Visit  Cognitive Function:    07/13/2018    3:39 PM  MMSE - Mini Mental State Exam  Orientation to time 5  Orientation to Place 5  Registration 3  Attention/ Calculation 5  Recall 3  Language- name 2 objects 2  Language- repeat 1  Language- follow 3 step command 3  Language- read & follow direction 1  Write a sentence 1  Copy design 1  Total score 30        05/11/2023    3:42 PM 05/10/2022    4:56 PM  6CIT Screen  What Year? 0 points 0 points  What month? 0 points 0 points  What time? 0 points 0 points  Count back from 20 0 points 0 points  Months in reverse 0 points 0 points  Repeat phrase 0 points 0 points  Total Score 0 points 0 points    Immunizations Immunization History  Administered Date(s) Administered  Influenza Inj Mdck Quad With Preservative 10/31/2018, 07/16/2019   Influenza Split 12/28/2012   Influenza Whole 08/18/2010   Influenza, High Dose Seasonal PF 09/05/2016, 09/06/2017, 10/14/2021   Influenza,inj,Quad PF,6+ Mos 07/22/2015   Influenza-Unspecified 01/16/2014   PFIZER(Purple Top)SARS-COV-2 Vaccination 01/02/2020, 01/23/2020, 08/23/2020   Pneumococcal Conjugate-13 01/27/2014   Pneumococcal Polysaccharide-23 08/28/2006   Tdap 03/01/2017    TDAP status: Up to date  Flu Vaccine status: Due, Education has been provided regarding the importance of this vaccine. Advised may receive this vaccine at local pharmacy or Health Dept. Aware to provide a copy of the vaccination record if obtained from local pharmacy or Health Dept. Verbalized acceptance and understanding.  Pneumococcal vaccine status: Due, Education has been provided regarding the importance of this vaccine. Advised may receive this vaccine at local pharmacy or Health Dept. Aware to provide a copy of the vaccination record if obtained from local pharmacy or Health Dept. Verbalized acceptance and  understanding.  Covid-19 vaccine status: Completed vaccines  Qualifies for Shingles Vaccine? Yes   Zostavax completed No   Shingrix Completed?: No.    Education has been provided regarding the importance of this vaccine. Patient has been advised to call insurance company to determine out of pocket expense if they have not yet received this vaccine. Advised may also receive vaccine at local pharmacy or Health Dept. Verbalized acceptance and understanding.  Screening Tests Health Maintenance  Topic Date Due   Pneumonia Vaccine 41+ Years old (3 of 3 - PPSV23 or PCV20) 01/28/2019   Colonoscopy  03/21/2021   COVID-19 Vaccine (4 - 2023-24 season) 07/29/2022   Lung Cancer Screening  09/26/2022   INFLUENZA VACCINE  06/29/2023   Medicare Annual Wellness (AWV)  05/10/2024   DTaP/Tdap/Td (2 - Td or Tdap) 03/02/2027   Hepatitis C Screening  Completed   HPV VACCINES  Aged Out   Zoster Vaccines- Shingrix  Discontinued    Health Maintenance  Health Maintenance Due  Topic Date Due   Pneumonia Vaccine 46+ Years old (3 of 3 - PPSV23 or PCV20) 01/28/2019   Colonoscopy  03/21/2021   COVID-19 Vaccine (4 - 2023-24 season) 07/29/2022   Lung Cancer Screening  09/26/2022    Colorectal cancer screening: Type of screening: Colonoscopy. Completed 03/21/2018. Repeat every 3-5 years  Lung Cancer Screening: (Low Dose CT Chest recommended if Age 9-80 years, 30 pack-year currently smoking OR have quit w/in 15years.) does qualify.   Lung Cancer Screening Referral: overdue since 09/26/2021  Additional Screening:  Hepatitis C Screening: does not qualify; Completed: no  Vision Screening: Recommended annual ophthalmology exams for early detection of glaucoma and other disorders of the eye. Is the patient up to date with their annual eye exam?  Yes  Who is the provider or what is the name of the office in which the patient attends annual eye exams? Looking for new eye doctor  If pt is not established with a  provider, would they like to be referred to a provider to establish care? No .   Dental Screening: Recommended annual dental exams for proper oral hygiene  Community Resource Referral / Chronic Care Management: CRR required this visit?  No   CCM required this visit?  No      Plan:     I have personally reviewed and noted the following in the patient's chart:   Medical and social history Use of alcohol, tobacco or illicit drugs  Current medications and supplements including opioid prescriptions. Patient is not currently taking opioid prescriptions. Functional  ability and status Nutritional status Physical activity Advanced directives List of other physicians Hospitalizations, surgeries, and ER visits in previous 12 months Vitals Screenings to include cognitive, depression, and falls Referrals and appointments  In addition, I have reviewed and discussed with patient certain preventive protocols, quality metrics, and best practice recommendations. A written personalized care plan for preventive services as well as general preventive health recommendations were provided to patient.     Mickeal Needy, LPN   1/61/0960   Nurse Notes: Normal cognitive status assessed by direct observation via telephone conversation by this Nurse Health Advisor. No abnormalities found.  Medical screening examination/treatment/procedure(s) were performed by non-physician practitioner and as supervising physician I was immediately available for consultation/collaboration.  I agree with above. Jacinta Shoe, MD

## 2023-05-12 NOTE — Telephone Encounter (Signed)
Is he taking furosemide daily? Put on compression knee-high socks in the morning, take off at night if tolerated Thanks

## 2023-05-15 NOTE — Telephone Encounter (Signed)
Notified pt w/ MD response. He states he is taking the furosemide daily, and he has ordered knee high stock.Marland KitchenRaechel Chute

## 2023-05-27 ENCOUNTER — Other Ambulatory Visit: Payer: Self-pay | Admitting: Internal Medicine

## 2023-05-30 ENCOUNTER — Other Ambulatory Visit: Payer: Self-pay | Admitting: *Deleted

## 2023-05-30 DIAGNOSIS — I779 Disorder of arteries and arterioles, unspecified: Secondary | ICD-10-CM

## 2023-05-30 DIAGNOSIS — I739 Peripheral vascular disease, unspecified: Secondary | ICD-10-CM

## 2023-06-05 ENCOUNTER — Other Ambulatory Visit: Payer: Self-pay | Admitting: Physician Assistant

## 2023-06-06 ENCOUNTER — Other Ambulatory Visit: Payer: Self-pay | Admitting: *Deleted

## 2023-06-06 DIAGNOSIS — I739 Peripheral vascular disease, unspecified: Secondary | ICD-10-CM

## 2023-06-06 DIAGNOSIS — I779 Disorder of arteries and arterioles, unspecified: Secondary | ICD-10-CM

## 2023-06-26 ENCOUNTER — Ambulatory Visit (INDEPENDENT_AMBULATORY_CARE_PROVIDER_SITE_OTHER)
Admission: RE | Admit: 2023-06-26 | Discharge: 2023-06-26 | Disposition: A | Discharge status: Home / Self Care | Payer: Medicare HMO | Source: Ambulatory Visit | Attending: Surgery | Admitting: Surgery

## 2023-06-26 ENCOUNTER — Ambulatory Visit (HOSPITAL_COMMUNITY)
Admission: RE | Admit: 2023-06-26 | Discharge: 2023-06-26 | Disposition: A | Discharge status: Home / Self Care | Payer: Medicare HMO | Source: Ambulatory Visit | Attending: Surgery | Admitting: Surgery

## 2023-06-26 ENCOUNTER — Ambulatory Visit: Payer: Medicare HMO | Admitting: Physician Assistant

## 2023-06-26 VITALS — BP 179/73 | HR 60 | Temp 98.5°F | Resp 18 | Ht 64.0 in | Wt 160.2 lb

## 2023-06-26 DIAGNOSIS — I779 Disorder of arteries and arterioles, unspecified: Secondary | ICD-10-CM

## 2023-06-26 DIAGNOSIS — I739 Peripheral vascular disease, unspecified: Secondary | ICD-10-CM

## 2023-06-26 LAB — VAS US ABI WITH/WO TBI: Right ABI: 1.14

## 2023-06-26 NOTE — Progress Notes (Signed)
Office Note     CC:  follow up Requesting Provider:  Tresa Garter, MD  HPI: Douglas Christensen is a 72 y.o. ([birth year]) male who presents for surveillance of PAD.  Most recently he underwent atherectomy and stent placement of the right common iliac artery, external iliac artery, and popliteal artery on 08/06/2019 by Dr. Myra Gianotti due to disabling claudication.  He has history of left femoral endarterectomy with vein patch angioplasty, atherectomy and stenting of left iliac and SFA on 01/29/2015 by Dr. Myra Gianotti.  Also has prior right common femoral endarterectomy and femoral to above-the-knee popliteal artery bypass with vein on 12/10/2012 by Dr. Myra Gianotti.  Patient lives in [place].  He denies any calf claudication, rest pain, or tissue loss of bilateral lower extremities.  He has pain in both hips when he walks however attributes this to arthritis.  He states he has had no return of disabling claudication symptoms like he experienced prior to revascularization surgeries on bilateral lower extremities.  He is a former smoker.  He is on a daily aspirin and statin.  It should be noted that he has not had a carotid artery duplex since September 2021.  He denies any strokelike symptoms including slurring speech, changes in vision, or one-sided weakness.  He has not been diagnosed with a mini stroke or stroke since last carotid duplex.   Past Medical History:  Diagnosis Date   Anxiety    Arthritis    CAD (coronary artery disease)    Mild plaque (cath "years ago"); abnormal Myoview 04/2013 with subsequent CABG x 5 with LIMA to LAD, SVG to OM1, SVG to DX, SVG to PD & PL. // Myoview 10/23: EF 62, no ischemia or infarction; low risk   Carotid artery disease (HCC)    Carotid US 07/2019 bilateral ICA 40-59; bilateral subclavian stenosis // Carotid US 9/21: Bilateral ICA 40-59; L VA occluded; R subclavian stenosis   Cataract    GERD (gastroesophageal reflux disease)    History of colonic polyps    History of  echocardiogram    Echo 2/19: Mild LVH, EF 60-65, normal wall motion, grade 2 diastolic dysfunction, mild RAE   History of nuclear stress test    Myoview 2/19: inf/inf-lat/apical inf/ant-sept defect (?diaph atten - cannot rule out peri-infarct ischemia), PVCs/PACs/Mobitz 1 // Myoview 05/2019: EF 60, inf infarct with mild peri-infarct ischemia; no significant change when compared to prior study; Intermediate Risk   Hx of echocardiogram    Echo (9/15):  EF 55-60%; Gr 2 DD, mild BAE   Hyperlipidemia    Hypertension    LBP (low back pain)    Meralgia paresthetica of left side 2011   Polycythemia, 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  Echo 10/23: EF 55-60, no RWMA, mild LVH, normal RVSF, mild RAE, mild MR, AV sclerosis without stenosis, RAP 3    PVD (peripheral vascular disease) (HCC)    Stent to left common femoral and right superficial femoral.  2008.  50%  left renal    Second degree AV block, Mobitz type I    Holter 2/19: Sinus rhythm, average heart rate 72, frequent PVCs (burden 5%), second-degree type I AV block and periods of 2:1 heart block >> continue clinical managment and avoid AVN blocking agents   Shortness of breath    "once in awhile; can happen at anytime" (  08/26/2013)   Sleep apnea    mod OSA, central sleep apnea/hypoapnea syndrome 11/22/12, CPAP every night    Subclavian artery stenosis (HCC)    Carotid US 07/2019 bilateral ICA 40-59; bilateral subclavian stenosis   Vitamin D deficiency     Past Surgical History:  Procedure Laterality Date   ABDOMINAL AORTAGRAM N/A 11/16/2011   Procedure: ABDOMINAL Ronny Flurry;  Surgeon: Tonny Bollman, MD;  Location: [facility] CATH LAB;  Service: Cardiovascular;  Laterality: N/A;   ABDOMINAL AORTAGRAM N/A 11/14/2012   Procedure: ABDOMINAL Ronny Flurry;  Surgeon: Tonny Bollman, MD;   Location: [facility] CATH LAB;  Service: Cardiovascular;  Laterality: N/A;   ABDOMINAL AORTAGRAM N/A 12/30/2014   Procedure: ABDOMINAL Ronny Flurry;  Surgeon: Nada Libman, MD;  Location: [facility] CATH LAB;  Service: Cardiovascular;  Laterality: N/A;   ABDOMINAL AORTOGRAM W/LOWER EXTREMITY Bilateral 08/06/2019   Procedure: ABDOMINAL AORTOGRAM W/LOWER EXTREMITY;  Surgeon: Nada Libman, MD;  Location: [facility] INVASIVE CV LAB;  Service: Cardiovascular;  Laterality: Bilateral;   ABDOMINAL AORTOGRAM W/LOWER EXTREMITY Bilateral 11/02/2021   Procedure: ABDOMINAL AORTOGRAM W/LOWER EXTREMITY;  Surgeon: Nada Libman, MD;  Location: [facility] INVASIVE CV LAB;  Service: Cardiovascular;  Laterality: Bilateral;   CARDIAC CATHETERIZATION  05/02/13   x2    CORONARY ARTERY BYPASS GRAFT N/A 05/06/2013   Procedure: CORONARY ARTERY BYPASS GRAFTING (CABG);  Surgeon: Loreli Slot, MD;  Location: [facility] OR;  Service: Open Heart Surgery;  Laterality: N/A;  Coronary artery bypass graft times five using left internal mammary artery and left greater saphenous vein via endovein harvest.   ENDARTERECTOMY FEMORAL Left 01/29/2015   Procedure: ENDARTERECTOMY FEMORAL WITH PATCH ANGIOPLASTY;  Surgeon: Nada Libman, MD;  Location: [facility] OR;  Service: Vascular;  Laterality: Left;   EYE SURGERY  03/24/16   cataract surgery on left eye   FEMORAL-POPLITEAL BYPASS GRAFT  12/27/2012   Procedure: BYPASS GRAFT FEMORAL-POPLITEAL ARTERY;  Surgeon: Nada Libman, MD;  Location: [facility] OR;  Service: Vascular;  Laterality: Right;  using non-reversed sapphenous vein.   ILIAC ATHERECTOMY Left 01/29/2015   Procedure: SUPERFICIAL FEMORAL ARTERY ATHERECTOMY/PERCUTANEOUS TRANSLUMINAL ANGIOPLASTY; superficial femoral artery stent;  Surgeon: Nada Libman, MD;  Location: [facility] OR;  Service: Vascular;  Laterality: Left;   LOWER EXTREMITY ANGIOGRAM Bilateral 11/16/2011   Procedure: LOWER EXTREMITY ANGIOGRAM;  Surgeon: Tonny Bollman, MD;  Location: [facility] CATH LAB;  Service:  Cardiovascular;  Laterality: Bilateral;   lower extremity stents     bilateral lower extremities x 2   PACEMAKER IMPLANT N/A 11/28/2019   Procedure: PACEMAKER IMPLANT;  Surgeon: Hillis Range, MD;  Location: [facility] INVASIVE CV LAB;  Service: Cardiovascular;  Laterality: N/A;   PERCUTANEOUS STENT INTERVENTION Left 11/16/2011   Procedure: PERCUTANEOUS STENT INTERVENTION;  Surgeon: Tonny Bollman, MD;  Location: [facility] CATH LAB;  Service: Cardiovascular;  Laterality: Left;   PERIPHERAL VASCULAR ATHERECTOMY Right 08/06/2019   Procedure: PERIPHERAL VASCULAR ATHERECTOMY;  Surgeon: Nada Libman, MD;  Location: [facility] INVASIVE CV LAB;  Service: Cardiovascular;  Laterality: Right;  Common iliac, external iliac, and popliteal.   PERIPHERAL VASCULAR INTERVENTION Right 08/06/2019   Procedure: PERIPHERAL VASCULAR INTERVENTION;  Surgeon: Nada Libman, MD;  Location: [facility] INVASIVE CV LAB;  Service: Cardiovascular;  Laterality: Right;  common iliac, external iliac, and popliteal.   TONSILLECTOMY     TOTAL HIP ARTHROPLASTY Left 06/29/2015   Procedure: TOTAL HIP ARTHROPLASTY;  Surgeon: Gean Birchwood, MD;  Location: [facility] OR;  Service: Orthopedics;  Laterality: Left;  LEFT TOTAL HIP ARTHROPLASTY DEPUY SROM/PINNACLE    Social  History   Socioeconomic History   Marital status: Widowed    Spouse name: Not on file   Number of children: 2   Years of education: Not on file   Highest education level: Not on file  Occupational History   Occupation: Detective    Employer: [employer]  Tobacco Use   Smoking status: Former    Current packs/day: 0.00    Average packs/day: 2.0 packs/day for 47.0 years (94.0 ttl pk-yrs)    Types: Cigarettes    Start date: 12/17/1965    Quit date: 12/17/2012    Years since quitting: 10.5   Smokeless tobacco: Never  Vaping Use   Vaping status: Never Used  Substance and Sexual Activity   Alcohol use: Yes    Alcohol/week: 4.0 standard drinks of alcohol    Types: 4 Shots of liquor per week     Comment: 08/26/2013 "3-4 mixed drinks/wk"   Drug use: No   Sexual activity: Yes  Other Topics Concern   Not on file  Social History Narrative   Widower   Social Determinants of Health   Financial Resource Strain: Low Risk  (05/11/2023)   Overall Financial Resource Strain (CARDIA)    Difficulty of Paying Living Expenses: Not hard at all  Food Insecurity: No Food Insecurity (05/11/2023)   Hunger Vital Sign    Worried About Running Out of Food in the Last Year: Never true    Ran Out of Food in the Last Year: Never true  Transportation Needs: No Transportation Needs (05/11/2023)   PRAPARE - Administrator, Civil Service (Medical): No    Lack of Transportation (Non-Medical): No  Physical Activity: Sufficiently Active (05/11/2023)   Exercise Vital Sign    Days of Exercise per Week: 5 days    Minutes of Exercise per Session: 30 min  Stress: No Stress Concern Present (05/11/2023)   [employer] of Occupational Health - Occupational Stress Questionnaire    Feeling of Stress : Not at all  Social Connections: Socially Isolated (05/11/2023)   Social Connection and Isolation Panel [NHANES]    Frequency of Communication with Friends and Family: More than three times a week    Frequency of Social Gatherings with Friends and Family: More than three times a week    Attends Religious Services: Never    Database administrator or Organizations: No    Attends Banker Meetings: Never    Marital Status: Widowed  Intimate Partner Violence: Not At Risk (05/11/2023)   Humiliation, Afraid, Rape, and Kick questionnaire    Fear of Current or Ex-Partner: No    Emotionally Abused: No    Physically Abused: No    Sexually Abused: No    Family History  Problem Relation Age of Onset   Heart disease Mother        No clear CAD and Heart Disease before age 35   Hypertension Mother    Cancer Mother        ? colon ca   Hyperlipidemia Mother    Cancer Father        brain tumor    Alzheimer's disease Father    Colon cancer Neg Hx    Heart attack Neg Hx    Esophageal cancer Neg Hx    Liver cancer Neg Hx    Rectal cancer Neg Hx    Stomach cancer Neg Hx    Pancreatic cancer Neg Hx     Current Outpatient Medications  Medication Sig Dispense  Refill   acetaminophen (TYLENOL) 650 MG CR tablet Take 1,300 mg by mouth every 8 (eight) hours as needed for pain.     Ascorbic Acid (VITAMIN C PO) Take 1 tablet by mouth in the morning.     aspirin EC 81 MG tablet Take 81 mg by mouth every other day. Swallow whole.     cholecalciferol (VITAMIN D) 1000 units tablet Take 1,000 Units by mouth daily.      clonazePAM (KLONOPIN) 0.5 MG tablet TAKE ONE TABLET BY MOUTH TWICE DAILY AS NEEDED FOR ANXIETY 60 tablet 3   doxycycline (VIBRA-TABS) 100 MG tablet Take 1 tablet (100 mg total) by mouth 2 (two) times daily. 60 tablet 3   furosemide (LASIX) 40 MG tablet Take 0.5-1 tablets (20-40 mg total) by mouth daily as needed for edema. 30 tablet 3   losartan (COZAAR) 50 MG tablet Take 1.5 tablets (75 mg total) by mouth as directed. TAKE ONE TABLET BY MOUTH EVERY MORNING, AND TAKE 1/2 TABLET EVERY IN THE EVENING 135 tablet 2   metoprolol succinate (TOPROL-XL) 50 MG 24 hr tablet Take 1 tablet (50 mg total) by mouth at bedtime. Take with or immediately following a meal. 90 tablet 3   metroNIDAZOLE (METROCREAM) 0.75 % cream Apply topically 2 (two) times daily. 45 g 3   nitroGLYCERIN (NITROSTAT) 0.4 MG SL tablet Place 1 tablet (0.4 mg total) under the tongue every 5 (five) minutes as needed for chest pain. 25 tablet 11   predniSONE (DELTASONE) 10 MG tablet TAKE 4 TABLETS FOR THREE DAYS, THREE tabs FOR FOUR DAYS, TWO tabs FOR FOUR DAYS, ONE tab FOR SIX DAYS 38 tablet 1   rosuvastatin (CRESTOR) 40 MG tablet Take 1 tablet (40 mg total) by mouth 3 (three) times a week. . 12 tablet 3   tadalafil, PAH, (ADCIRCA) 20 MG tablet Take 20 mg by mouth every 3 (three) days.     vitamin B-12 (CYANOCOBALAMIN) 1000 MCG  tablet Take 1,000 mcg by mouth daily.     cyclobenzaprine (FLEXERIL) 10 MG tablet Take 1 tablet (10 mg total) by mouth 3 (three) times daily as needed for muscle spasms. (Patient not taking: Reported on 03/14/2023) 60 tablet 1   traMADol (ULTRAM) 50 MG tablet Take 1 tablet (50 mg total) by mouth every 6 (six) hours as needed. (Patient not taking: Reported on 03/14/2023) 120 tablet 5   No current facility-administered medications for this visit.    Allergies  Allergen Reactions   Roxicodone [Oxycodone Hcl] Other (See Comments)    hallucinations   Benazepril Cough   Gabapentin Rash   Hydralazine Hives and Rash    Skin lupus type reaction   Hydrocodone Hives   Itraconazole Nausea Only and Rash   Lipitor [Atorvastatin Calcium]     cramps   Oxycodone Hives     REVIEW OF SYSTEMS:   [X]  denotes positive finding, [ ]  denotes negative finding Cardiac  Comments:  Chest pain or chest pressure:    Shortness of breath upon exertion:    Short of breath when lying flat:    Irregular heart rhythm:        Vascular    Pain in calf, thigh, or hip brought on by ambulation:    Pain in feet at night that wakes you up from your sleep:     Blood clot in your veins:    Leg swelling:         Pulmonary    Oxygen at home:    Productive  cough:     Wheezing:         Neurologic    Sudden weakness in arms or legs:     Sudden numbness in arms or legs:     Sudden onset of difficulty speaking or slurred speech:    Temporary loss of vision in one eye:     Problems with dizziness:         Gastrointestinal    Blood in stool:     Vomited blood:         Genitourinary    Burning when urinating:     Blood in urine:        Psychiatric    Major depression:         Hematologic    Bleeding problems:    Problems with blood clotting too easily:        Skin    Rashes or ulcers:        Constitutional    Fever or chills:      PHYSICAL EXAMINATION:  Vitals:   06/26/23 0938  BP: (!) 179/73   Pulse: 60  Resp: 18  Temp: 98.5 F (36.9 C)  TempSrc: Temporal  SpO2: 95%  Weight: 160 lb 3.2 oz (72.7 kg)  Height: 5\' 4"  (1.626 m)    General:  WDWN in NAD; vital signs documented above Gait: Not observed HENT: WNL, normocephalic Pulmonary: normal non-labored breathing , without Rales, rhonchi,  wheezing Cardiac: regular HR Abdomen: soft, NT, no masses Skin: without rashes Vascular Exam/Pulses: 2+ palpable DP pulses bilateral lower extremities Extremities: without ischemic changes, without Gangrene , without cellulitis; without open wounds;  Musculoskeletal: no muscle wasting or atrophy  Neurologic: A&O X 3; cranial nerves grossly intact Psychiatric:  The pt has Normal affect.   Non-Invasive Vascular Imaging:   Aortoiliac duplex Left external iliac artery with velocities greater than 600.  Also with 350 cm/s in the distal external iliac artery  Right external iliac artery stent with 359 cm/s velocity  Patent right leg bypass graft  Patent left SFA stentABI/TBIToday's ABIToday's TBIPrevious ABIPrevious TBI  +-------+-----------+-----------+------------+------------+  Right 1.14       0.85       1.12        0.90          +-------+-----------+-----------+------------+------------+  Left  [place]         0.69       1.05        0.77          +-------+-----------+-----------+------------+------------+     ASSESSMENT/PLAN:: 72 y.o. male here for follow up for surveillance of PAD with history of numerous revascularization interventions of bilateral lower extremities  -Mr. Monserrat Linarez is a 72 year old male with numerous revascularization interventions of bilateral lower extremities.  He is without disabling claudication as well as rest pain or tissue loss.  He has easily palpable 2+ DP pulses bilaterally.  He has worsening velocities in both external iliac arteries especially in the left external iliac artery.  Aortogram in December 2022 demonstrated widely patent iliac  artery stents despite elevated velocities.  Case was discussed with Dr. Myra Gianotti.  Because he has had an aortogram demonstrating widely patent iliac arteries despite elevated velocities on duplex, we will continue to monitor for symptoms and/or drop in ABI/TBI readings.  Patient will continue his aspirin and statin.   Emilie Rutter, PA-C [facility] [phone]  Clinic MD:   Lenell Antu

## 2023-06-27 ENCOUNTER — Ambulatory Visit (INDEPENDENT_AMBULATORY_CARE_PROVIDER_SITE_OTHER): Payer: Medicare HMO

## 2023-06-27 DIAGNOSIS — I442 Atrioventricular block, complete: Secondary | ICD-10-CM

## 2023-06-27 LAB — CUP PACEART REMOTE DEVICE CHECK
Battery Remaining Longevity: 97 mo
Battery Voltage: 3 V
Brady Statistic AP VP Percent: 42.66 %
Brady Statistic AP VS Percent: 0.01 %
Brady Statistic AS VP Percent: 56.06 %
Brady Statistic AS VS Percent: 1.27 %
Brady Statistic RA Percent Paced: 43.05 %
Brady Statistic RV Percent Paced: 98.72 %
Date Time Interrogation Session: 20240730062317
Implantable Lead Connection Status: 753985
Implantable Lead Connection Status: 753985
Implantable Lead Implant Date: 20201231
Implantable Lead Implant Date: 20201231
Implantable Lead Location: 753859
Implantable Lead Location: 753860
Implantable Lead Model: 3830
Implantable Lead Model: 5076
Implantable Pulse Generator Implant Date: 20201231
Lead Channel Impedance Value: 285 Ohm
Lead Channel Impedance Value: 361 Ohm
Lead Channel Impedance Value: 418 Ohm
Lead Channel Impedance Value: 513 Ohm
Lead Channel Pacing Threshold Amplitude: 0.625 V
Lead Channel Pacing Threshold Amplitude: 0.75 V
Lead Channel Pacing Threshold Pulse Width: 0.4 ms
Lead Channel Pacing Threshold Pulse Width: 0.4 ms
Lead Channel Sensing Intrinsic Amplitude: 4.5 mV
Lead Channel Sensing Intrinsic Amplitude: 4.5 mV
Lead Channel Sensing Intrinsic Amplitude: 5.5 mV
Lead Channel Sensing Intrinsic Amplitude: 5.5 mV
Lead Channel Setting Pacing Amplitude: 1.5 V
Lead Channel Setting Pacing Amplitude: 2.5 V
Lead Channel Setting Pacing Pulse Width: 0.4 ms
Lead Channel Setting Sensing Sensitivity: 1.2 mV
Zone Setting Status: 755011
Zone Setting Status: 755011

## 2023-06-28 ENCOUNTER — Other Ambulatory Visit: Payer: Self-pay | Admitting: *Deleted

## 2023-06-28 DIAGNOSIS — R0989 Other specified symptoms and signs involving the circulatory and respiratory systems: Secondary | ICD-10-CM

## 2023-06-30 ENCOUNTER — Other Ambulatory Visit: Payer: Self-pay

## 2023-06-30 DIAGNOSIS — I739 Peripheral vascular disease, unspecified: Secondary | ICD-10-CM

## 2023-07-19 NOTE — Progress Notes (Signed)
Remote pacemaker transmission.   

## 2023-07-24 ENCOUNTER — Ambulatory Visit (HOSPITAL_COMMUNITY)
Admission: RE | Admit: 2023-07-24 | Discharge: 2023-07-24 | Disposition: A | Discharge status: Home / Self Care | Payer: Medicare HMO | Source: Ambulatory Visit | Attending: Cardiology | Admitting: Cardiology

## 2023-07-24 ENCOUNTER — Ambulatory Visit (HOSPITAL_COMMUNITY): Payer: Medicare HMO

## 2023-07-24 ENCOUNTER — Ambulatory Visit: Payer: Medicare HMO | Admitting: Cardiovascular Disease

## 2023-07-24 ENCOUNTER — Encounter: Payer: Self-pay | Admitting: Cardiovascular Disease

## 2023-07-24 VITALS — BP 122/64 | HR 60 | Ht 64.0 in | Wt 168.0 lb

## 2023-07-24 DIAGNOSIS — I442 Atrioventricular block, complete: Secondary | ICD-10-CM

## 2023-07-24 DIAGNOSIS — I10 Essential (primary) hypertension: Secondary | ICD-10-CM

## 2023-07-24 DIAGNOSIS — E785 Hyperlipidemia, unspecified: Secondary | ICD-10-CM | POA: Diagnosis present

## 2023-07-24 DIAGNOSIS — N1832 Chronic kidney disease, stage 3b: Secondary | ICD-10-CM

## 2023-07-24 DIAGNOSIS — I6523 Occlusion and stenosis of bilateral carotid arteries: Secondary | ICD-10-CM

## 2023-07-24 NOTE — Patient Instructions (Addendum)
Medication Instructions:  Your physician recommends that you continue on your current medications as directed. Please refer to the Current Medication list given to you today.  *If you need a refill on your cardiac medications before your next appointment, please call your pharmacy*  Lab Work: Lipids, liver function panel today If you have labs (blood work) drawn today and your tests are completely normal, you will receive your results only by: MyChart Message (if you have MyChart) OR A paper copy in the mail If you have any lab test that is abnormal or we need to change your treatment, we will call you to review the results.  Testing/Procedures: Carotid Ultrasound Your physician has requested that you have a carotid duplex. This test is an ultrasound of the carotid arteries in your neck. It looks at blood flow through these arteries that supply the brain with blood. Allow one hour for this exam. There are no restrictions or special instructions.  Follow-Up: At [facility], you and your health needs are our priority.  As part of our continuing mission to provide you with exceptional heart care, we have created designated Provider Care Teams.  These Care Teams include your primary Cardiologist (physician) and Advanced Practice Providers (APPs -  Physician Assistants and Nurse Practitioners) who all work together to provide you with the care you need, when you need it.  Your next appointment:   1 year(s)  Provider:   Tonny Bollman, MD

## 2023-07-24 NOTE — Progress Notes (Signed)
Cardiology Office Note:    Date:  07/24/2023   ID:  Douglas Christensen, DOB [birth year], MRN [id number]  PCP:  Tresa Garter, MD   [facility] Providers Cardiologist:  Tonny Bollman, MD Electrophysiologist:  Hillis Range, MD (Inactive)     Referring MD: Tresa Garter, MD   Chief Complaint  Patient presents with   Coronary Artery Disease    History of Present Illness:    Douglas Christensen is a 72 y.o. male with a hx of:  CAD s/p CABG in 04/2013 Myoview 10/23: Normal myocardial perfusion and normal LVEF PAD  s/p R SFA stent and L CIA stent in 2008 L EIA stent 2012 L EIA/CFA/PFA/SFA endarterectomy R fem-pop 2014 L SFA stent in 3/16 S/p stent to R CIA, R EIA, R pop in 07/2019 Carotid artery stenosis [place] 9/21: bilat ICA 40-59  R subclavian stenosis L VA occluded  2nd degree heart block type 1 2-1 heart block in 06/2017 >> resolved with stopping beta-blocker  S/p pacemaker in 2021  ??Abdominal aortic aneurysm (Lumbar MRI in 2020: 3.2 cm) [place] 07/2019: no AAA Aortic atherosclerosis  Hypertension   Hyperlipidemia  Chronic kidney disease  OSA GERD,  prior ETOH abuse Secondary polycythemia (smoking) - followed by Dr. Myna Hidalgo    The patient's last stress Myoview scan was 1 year ago and this demonstrated normal LV perfusion with no evidence of ischemia, LVEF 62%.  An echocardiogram also performed at that time in October 2023 showed an LVEF of 55 to 60%, normal LV and RV function, mild mitral regurgitation, and aortic sclerosis.  He was reassured regarding his echo and stress test findings.  The patient is here alone today.  He has relocated to the [facility] area and he lives in [place].  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  He has no chest pain, chest pressure, or shortness of breath.  He does complain of generalized fatigue.  He states that he can work pretty well from about 10 AM to 2 PM, but then he is tired the rest of the  afternoon.  He is compliant with his medications.  He had a home nurse examine him and he was told that his carotid pulses were strong on 1 side and weak on the other side.  He has some concerns about this and wondered about having that checked.  Past Medical History:  Diagnosis Date   Anxiety    Arthritis    CAD (coronary artery disease)    Mild plaque (cath "years ago"); abnormal Myoview 04/2013 with subsequent CABG x 5 with LIMA to LAD, SVG to OM1, SVG to DX, SVG to PD & PL. // Myoview 10/23: EF 62, no ischemia or infarction; low risk   Carotid artery disease (HCC)    Carotid US 07/2019 bilateral ICA 40-59; bilateral subclavian stenosis // Carotid US 9/21: Bilateral ICA 40-59; L VA occluded; R subclavian stenosis   Cataract    GERD (gastroesophageal reflux disease)    History of colonic polyps    History of echocardiogram    Echo 2/19: Mild LVH, EF 60-65, normal wall motion, grade 2 diastolic dysfunction, mild RAE   History of nuclear stress test    Myoview 2/19: inf/inf-lat/apical inf/ant-sept defect (?diaph atten - cannot rule out peri-infarct ischemia), PVCs/PACs/Mobitz 1 // Myoview 05/2019: EF 60, inf infarct with mild peri-infarct ischemia; no significant change when compared to prior study; Intermediate Risk   Hx of echocardiogram  Echo (9/15):  EF 55-60%; Gr 2 DD, mild BAE   Hyperlipidemia    Hypertension    LBP (low back pain)    Meralgia paresthetica of left side 2011   Polycythemia, 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  Echo 10/23: EF 55-60, no RWMA, mild LVH, normal RVSF, mild RAE, mild MR, AV sclerosis without stenosis, RAP 3    PVD (peripheral vascular disease) (HCC)    Stent to left common femoral and right superficial femoral.  2008.  50%  left renal    Second degree AV block, Mobitz type I    Holter  2/19: Sinus rhythm, average heart rate 72, frequent PVCs (burden 5%), second-degree type I AV block and periods of 2:1 heart block >> continue clinical managment and avoid AVN blocking agents   Shortness of breath    "once in awhile; can happen at anytime" (08/26/2013)   Sleep apnea    mod OSA, central sleep apnea/hypoapnea syndrome 11/22/12, CPAP every night    Subclavian artery stenosis (HCC)    Carotid US 07/2019 bilateral ICA 40-59; bilateral subclavian stenosis   Vitamin D deficiency     Past Surgical History:  Procedure Laterality Date   ABDOMINAL AORTAGRAM N/A 11/16/2011   Procedure: ABDOMINAL Ronny Flurry;  Surgeon: Tonny Bollman, MD;  Location: [facility] CATH LAB;  Service: Cardiovascular;  Laterality: N/A;   ABDOMINAL AORTAGRAM N/A 11/14/2012   Procedure: ABDOMINAL Ronny Flurry;  Surgeon: Tonny Bollman, MD;  Location: [facility] CATH LAB;  Service: Cardiovascular;  Laterality: N/A;   ABDOMINAL AORTAGRAM N/A 12/30/2014   Procedure: ABDOMINAL Ronny Flurry;  Surgeon: Nada Libman, MD;  Location: [facility] CATH LAB;  Service: Cardiovascular;  Laterality: N/A;   ABDOMINAL AORTOGRAM W/LOWER EXTREMITY Bilateral 08/06/2019   Procedure: ABDOMINAL AORTOGRAM W/LOWER EXTREMITY;  Surgeon: Nada Libman, MD;  Location: [facility] INVASIVE CV LAB;  Service: Cardiovascular;  Laterality: Bilateral;   ABDOMINAL AORTOGRAM W/LOWER EXTREMITY Bilateral 11/02/2021   Procedure: ABDOMINAL AORTOGRAM W/LOWER EXTREMITY;  Surgeon: Nada Libman, MD;  Location: [facility] INVASIVE CV LAB;  Service: Cardiovascular;  Laterality: Bilateral;   CARDIAC CATHETERIZATION  05/02/13   x2    CORONARY ARTERY BYPASS GRAFT N/A 05/06/2013   Procedure: CORONARY ARTERY BYPASS GRAFTING (CABG);  Surgeon: Loreli Slot, MD;  Location: [facility] OR;  Service: Open Heart Surgery;  Laterality: N/A;  Coronary artery bypass graft times five using left internal mammary artery and left greater saphenous vein via endovein harvest.   ENDARTERECTOMY FEMORAL Left 01/29/2015   Procedure:  ENDARTERECTOMY FEMORAL WITH PATCH ANGIOPLASTY;  Surgeon: Nada Libman, MD;  Location: [facility] OR;  Service: Vascular;  Laterality: Left;   EYE SURGERY  03/24/16   cataract surgery on left eye   FEMORAL-POPLITEAL BYPASS GRAFT  12/27/2012   Procedure: BYPASS GRAFT FEMORAL-POPLITEAL ARTERY;  Surgeon: Nada Libman, MD;  Location: [facility] OR;  Service: Vascular;  Laterality: Right;  using non-reversed sapphenous vein.   ILIAC ATHERECTOMY Left 01/29/2015   Procedure: SUPERFICIAL FEMORAL ARTERY ATHERECTOMY/PERCUTANEOUS TRANSLUMINAL ANGIOPLASTY; superficial femoral artery stent;  Surgeon: Nada Libman, MD;  Location: [facility] OR;  Service: Vascular;  Laterality: Left;   LOWER EXTREMITY ANGIOGRAM Bilateral 11/16/2011   Procedure: LOWER EXTREMITY ANGIOGRAM;  Surgeon: Tonny Bollman, MD;  Location: [facility] CATH LAB;  Service: Cardiovascular;  Laterality: Bilateral;   lower extremity stents     bilateral lower extremities x 2  PACEMAKER IMPLANT N/A 11/28/2019   Procedure: PACEMAKER IMPLANT;  Surgeon: Hillis Range, MD;  Location: [facility] INVASIVE CV LAB;  Service: Cardiovascular;  Laterality: N/A;   PERCUTANEOUS STENT INTERVENTION Left 11/16/2011   Procedure: PERCUTANEOUS STENT INTERVENTION;  Surgeon: Tonny Bollman, MD;  Location: [facility] CATH LAB;  Service: Cardiovascular;  Laterality: Left;   PERIPHERAL VASCULAR ATHERECTOMY Right 08/06/2019   Procedure: PERIPHERAL VASCULAR ATHERECTOMY;  Surgeon: Nada Libman, MD;  Location: [facility] INVASIVE CV LAB;  Service: Cardiovascular;  Laterality: Right;  Common iliac, external iliac, and popliteal.   PERIPHERAL VASCULAR INTERVENTION Right 08/06/2019   Procedure: PERIPHERAL VASCULAR INTERVENTION;  Surgeon: Nada Libman, MD;  Location: [facility] INVASIVE CV LAB;  Service: Cardiovascular;  Laterality: Right;  common iliac, external iliac, and popliteal.   TONSILLECTOMY     TOTAL HIP ARTHROPLASTY Left 06/29/2015   Procedure: TOTAL HIP ARTHROPLASTY;  Surgeon: Gean Birchwood, MD;  Location: [facility] OR;  Service:  Orthopedics;  Laterality: Left;  LEFT TOTAL HIP ARTHROPLASTY DEPUY SROM/PINNACLE    Current Medications: Current Meds  Medication Sig   acetaminophen (TYLENOL) 650 MG CR tablet Take 1,300 mg by mouth every 8 (eight) hours as needed for pain.   Ascorbic Acid (VITAMIN C PO) Take 1 tablet by mouth in the morning.   aspirin EC 81 MG tablet Take 81 mg by mouth every other day. Swallow whole.   cholecalciferol (VITAMIN D) 1000 units tablet Take 1,000 Units by mouth daily.    clonazePAM (KLONOPIN) 0.5 MG tablet TAKE ONE TABLET BY MOUTH TWICE DAILY AS NEEDED FOR ANXIETY   cyclobenzaprine (FLEXERIL) 10 MG tablet Take 1 tablet (10 mg total) by mouth 3 (three) times daily as needed for muscle spasms.   doxycycline (VIBRA-TABS) 100 MG tablet Take 1 tablet (100 mg total) by mouth 2 (two) times daily.   furosemide (LASIX) 40 MG tablet Take 0.5-1 tablets (20-40 mg total) by mouth daily as needed for edema.   losartan (COZAAR) 50 MG tablet Take 1.5 tablets (75 mg total) by mouth as directed. TAKE ONE TABLET BY MOUTH EVERY MORNING, AND TAKE 1/2 TABLET EVERY IN THE EVENING   metoprolol succinate (TOPROL-XL) 50 MG 24 hr tablet Take 1 tablet (50 mg total) by mouth at bedtime. Take with or immediately following a meal.   metroNIDAZOLE (METROCREAM) 0.75 % cream Apply topically 2 (two) times daily.   nitroGLYCERIN (NITROSTAT) 0.4 MG SL tablet Place 1 tablet (0.4 mg total) under the tongue every 5 (five) minutes as needed for chest pain.   predniSONE (DELTASONE) 10 MG tablet TAKE 4 TABLETS FOR THREE DAYS, THREE tabs FOR FOUR DAYS, TWO tabs FOR FOUR DAYS, ONE tab FOR SIX DAYS   rosuvastatin (CRESTOR) 40 MG tablet Take 1 tablet (40 mg total) by mouth 3 (three) times a week. .   tadalafil, PAH, (ADCIRCA) 20 MG tablet Take 20 mg by mouth every 3 (three) days.   traMADol (ULTRAM) 50 MG tablet Take 1 tablet (50 mg total) by mouth every 6 (six) hours as needed.   vitamin B-12 (CYANOCOBALAMIN) 1000 MCG tablet Take 1,000 mcg  by mouth daily.     Allergies:   Roxicodone [oxycodone hcl], Benazepril, Gabapentin, Hydralazine, Hydrocodone, Itraconazole, Lipitor [atorvastatin calcium], and Oxycodone   Social History   Socioeconomic History   Marital status: Widowed    Spouse name: Not on file   Number of children: 2   Years of education: Not on file   Highest education level: Not on file  Occupational History   Occupation:  Detective    Employer: [employer]  Tobacco Use   Smoking status: Former    Current packs/day: 0.00    Average packs/day: 2.0 packs/day for 47.0 years (94.0 ttl pk-yrs)    Types: Cigarettes    Start date: 12/17/1965    Quit date: 12/17/2012    Years since quitting: 10.6   Smokeless tobacco: Never  Vaping Use   Vaping status: Never Used  Substance and Sexual Activity   Alcohol use: Yes    Alcohol/week: 4.0 standard drinks of alcohol    Types: 4 Shots of liquor per week    Comment: 08/26/2013 "3-4 mixed drinks/wk"   Drug use: No   Sexual activity: Yes  Other Topics Concern   Not on file  Social History Narrative   Widower   Social Determinants of Health   Financial Resource Strain: Low Risk  (05/11/2023)   Overall Financial Resource Strain (CARDIA)    Difficulty of Paying Living Expenses: Not hard at all  Food Insecurity: No Food Insecurity (05/11/2023)   Hunger Vital Sign    Worried About Running Out of Food in the Last Year: Never true    Ran Out of Food in the Last Year: Never true  Transportation Needs: No Transportation Needs (05/11/2023)   PRAPARE - Administrator, Civil Service (Medical): No    Lack of Transportation (Non-Medical): No  Physical Activity: Sufficiently Active (05/11/2023)   Exercise Vital Sign    Days of Exercise per Week: 5 days    Minutes of Exercise per Session: 30 min  Stress: No Stress Concern Present (05/11/2023)   [employer] of Occupational Health - Occupational Stress Questionnaire    Feeling of Stress : Not at all  Social  Connections: Socially Isolated (05/11/2023)   Social Connection and Isolation Panel [NHANES]    Frequency of Communication with Friends and Family: More than three times a week    Frequency of Social Gatherings with Friends and Family: More than three times a week    Attends Religious Services: Never    Database administrator or Organizations: No    Attends Banker Meetings: Never    Marital Status: Widowed     Family History: The patient's family history includes Alzheimer's disease in his father; Cancer in his father and mother; Heart disease in his mother; Hyperlipidemia in his mother; Hypertension in his mother. There is no history of Colon cancer, Heart attack, Esophageal cancer, Liver cancer, Rectal cancer, Stomach cancer, or Pancreatic cancer.  ROS:   Please see the history of present illness.    All other systems reviewed and are negative.  EKGs/Labs/Other Studies Reviewed:    The following studies were reviewed today: EKG Interpretation Date/Time:  Monday July 24 2023 10:56:21 EDT Ventricular Rate:  60 PR Interval:  178 QRS Duration:  134 QT Interval:  470 QTC Calculation: 470 R Axis:   -48  Text Interpretation: AV dual-paced rhythm When compared with ECG of 26-Sep-2021 16:03, PREVIOUS ECG IS PRESENT No significant change was found Confirmed by Tonny Bollman [phone]) on 07/24/2023 11:01:25 AM    Recent Labs: 08/24/2022: Magnesium 1.6; NT-Pro BNP 571 03/14/2023: ALT 8; BUN 53; Creatinine, Ser 1.65; Hemoglobin 13.6; Platelets 277.0; Potassium 3.8; Sodium 139; TSH 1.10  Recent Lipid Panel    Component Value Date/Time   CHOL 191 03/14/2023 1114   CHOL 134 04/02/2019 0956   TRIG 217.0 (H) 03/14/2023 1114   TRIG 118 09/25/2006 1002   HDL 25.80 (L) 03/14/2023  1114   HDL 40 04/02/2019 0956   CHOLHDL 7 03/14/2023 1114   VLDL 43.4 (H) 03/14/2023 1114   LDLCALC 46 10/05/2020 1003   LDLCALC 51 04/02/2019 0956   LDLDIRECT 124.0 03/14/2023 1114     Risk  Assessment/Calculations:                Physical Exam:    VS:  BP 122/64   Pulse 60   Ht 5\' 4"  (1.626 m)   Wt 168 lb (76.2 kg)   SpO2 97%   BMI 28.84 kg/m     Wt Readings from Last 3 Encounters:  07/24/23 168 lb (76.2 kg)  06/26/23 160 lb 3.2 oz (72.7 kg)  05/11/23 162 lb (73.5 kg)     GEN:  Well nourished, well developed in no acute distress HEENT: Normal NECK: No JVD; bilateral carotid bruits, the right carotid is 2+, the left carotid pulse is diminished LYMPHATICS: No lymphadenopathy CARDIAC: RRR, 2/6 ejection murmur at the right upper sternal border RESPIRATORY:  Clear to auscultation without rales, wheezing or rhonchi  ABDOMEN: Soft, non-tender, non-distended MUSCULOSKELETAL:  No edema; No deformity  SKIN: Warm and dry NEUROLOGIC:  Alert and oriented x 3 PSYCHIATRIC:  Normal affect   ASSESSMENT:    1. Essential hypertension   2. Bilateral carotid artery stenosis   3. Stage 3b chronic kidney disease (HCC)   4. Complete heart block (HCC)   5. Hyperlipidemia LDL goal <70    PLAN:    In order of problems listed above:  Blood pressure is well-controlled.  Continue losartan and metoprolol succinate. The patient has bilateral carotid bruits.  At the time of his last carotid Doppler in 2021 he had 40 to 59% stenosis bilaterally.  There is a discrepancy in his carotid pulse exam with a weak carotid on the left and a strong 1 on the right.  I have recommended a carotid Doppler study for further evaluation.  He has no stroke or TIA symptoms. Labs reviewed with most recent creatinine 1.65 mg/dL.  This is stable.  He is treated with an ARB. Status post permanent pacemaker, AV sequential pacing on today's EKG tracing which is personally reviewed Treated with rosuvastatin 40 mg daily.  Cholesterol 191, LDL 79.  The patient is fasting today for labs, will update his lipid panel as his goal LDL is less than 70.  We may need to intensify his lipid-lowering therapies.            Medication Adjustments/Labs and Tests Ordered: Current medicines are reviewed at length with the patient today.  Concerns regarding medicines are outlined above.  Orders Placed This Encounter  Procedures   Lipid panel   Hepatic function panel   EKG 12-Lead   EKG 12-Lead   VAS US CAROTID   No orders of the defined types were placed in this encounter.   Patient Instructions  Medication Instructions:  Your physician recommends that you continue on your current medications as directed. Please refer to the Current Medication list given to you today.  *If you need a refill on your cardiac medications before your next appointment, please call your pharmacy*  Lab Work: Lipids, liver function panel today If you have labs (blood work) drawn today and your tests are completely normal, you will receive your results only by: MyChart Message (if you have MyChart) OR A paper copy in the mail If you have any lab test that is abnormal or we need to change your treatment, we will call  you to review the results.  Testing/Procedures: Carotid Ultrasound Your physician has requested that you have a carotid duplex. This test is an ultrasound of the carotid arteries in your neck. It looks at blood flow through these arteries that supply the brain with blood. Allow one hour for this exam. There are no restrictions or special instructions.  Follow-Up: At [facility], you and your health needs are our priority.  As part of our continuing mission to provide you with exceptional heart care, we have created designated Provider Care Teams.  These Care Teams include your primary Cardiologist (physician) and Advanced Practice Providers (APPs -  Physician Assistants and Nurse Practitioners) who all work together to provide you with the care you need, when you need it.  Your next appointment:   1 year(s)  Provider:   Tonny Bollman, MD        Signed, Tonny Bollman, MD  07/24/2023 11:24 AM     [facility]

## 2023-07-25 LAB — LIPID PANEL
Chol/HDL Ratio: 4.2 ratio (ref 0.0–5.0)
Cholesterol, Total: 154 mg/dL (ref 100–199)
HDL: 37 mg/dL — ABNORMAL LOW (ref >39)
LDL Chol Calc (NIH): 97 mg/dL (ref 0–99)
Triglycerides: 107 mg/dL (ref 0–149)
VLDL Cholesterol Cal: 20 mg/dL (ref 5–40)

## 2023-07-25 LAB — HEPATIC FUNCTION PANEL
ALT: 9 IU/L (ref 0–44)
AST: 15 IU/L (ref 0–40)
Albumin: 3.8 g/dL (ref 3.8–4.8)
Alkaline Phosphatase: 64 IU/L (ref 44–121)
Bilirubin Total: 0.3 mg/dL (ref 0.0–1.2)
Bilirubin, Direct: 0.11 mg/dL (ref 0.00–0.40)
Total Protein: 6.1 g/dL (ref 6.0–8.5)

## 2023-07-28 ENCOUNTER — Ambulatory Visit: Payer: Medicare HMO | Admitting: Family

## 2023-07-28 ENCOUNTER — Telehealth: Payer: Self-pay

## 2023-07-28 ENCOUNTER — Other Ambulatory Visit: Payer: Medicare HMO

## 2023-07-28 DIAGNOSIS — E785 Hyperlipidemia, unspecified: Secondary | ICD-10-CM

## 2023-07-28 DIAGNOSIS — Z79899 Other long term (current) drug therapy: Secondary | ICD-10-CM

## 2023-07-28 MED ORDER — EZETIMIBE 10 MG PO TABS
10.0000 mg | ORAL_TABLET | Freq: Every day | ORAL | 3 refills | Status: DC
Start: 2023-07-28 — End: 2023-11-30

## 2023-07-28 NOTE — Telephone Encounter (Signed)
-----   Message from Tonny Bollman sent at 07/25/2023  5:07 PM EDT ----- LDL is above goal on rosuvastatin 40 mg daily.  Recommend add Zetia 10 mg daily and repeat lipids and LFTs in 3 to 4 months.  Thank you.

## 2023-07-28 NOTE — Telephone Encounter (Signed)
Called and spoke with patient who agrees to plan. Requests medication be sent to pharmacy at [facility] and repeat labs scheduled here for a Tuesday in December.  Labs scheduled for 11/14/23.

## 2023-08-02 ENCOUNTER — Other Ambulatory Visit: Payer: Self-pay | Admitting: Physician Assistant

## 2023-08-10 ENCOUNTER — Telehealth: Payer: Self-pay | Admitting: Pulmonary Disease

## 2023-08-27 ENCOUNTER — Other Ambulatory Visit: Payer: Self-pay | Admitting: Internal Medicine

## 2023-09-01 ENCOUNTER — Other Ambulatory Visit: Payer: Self-pay | Admitting: Internal Medicine

## 2023-09-08 ENCOUNTER — Other Ambulatory Visit: Payer: Self-pay | Admitting: Physician Assistant

## 2023-09-25 ENCOUNTER — Encounter: Payer: Self-pay | Admitting: Primary Care

## 2023-09-25 ENCOUNTER — Ambulatory Visit (INDEPENDENT_AMBULATORY_CARE_PROVIDER_SITE_OTHER): Payer: Medicare HMO | Admitting: Primary Care

## 2023-09-25 VITALS — BP 160/72 | HR 94 | Ht 65.0 in | Wt 171.0 lb

## 2023-09-25 DIAGNOSIS — G4733 Obstructive sleep apnea (adult) (pediatric): Secondary | ICD-10-CM | POA: Diagnosis not present

## 2023-09-25 NOTE — Progress Notes (Signed)
@Patient  ID: Douglas Christensen, male    DOB: [birth year], 73 y.o.   MRN: [id number]  Chief Complaint  Patient presents with   Consult    Pt is currently using cpap. Pt no longer has a dme company. Lives in [place].     Referring provider: Plotnikov, Georgina Quint, MD  HPI: 72 year old male, former smoker quit in 2014.  Past medical history significant for hypertension, coronary artery disease post CABG, hyperlipidemia, first-degree AV block, COPD, OSA on CPAP, GERD, chronic kidney disease, polycythemia, obesity.   PSG 02/21/2014 showed mild OSA, AHI 13.5/h with SpO2 low 67%.  09/25/2023  Patient presents today for sleep consult. He lived in [place] for 70 years, moved to [facility] but kept choose to keep his doctors here locally. History of sleep apnea, currently on CPAP.  He needs an order for new CPAP machine. Experiencing large amount of air leaks.   Current pressure 11 cm H2O, residual AHI 7.1/hour.  Patient reports benefit from CPAP use.  Typical bedtime 11 PM.  Takes him on average 5 minutes or less to fall asleep.  He wakes up 0-3 times a night.  He starts his day between 6 and 9 AM.  He lost 30 pounds in the last 2 years, he did mention this to his primary care.  He has gained back 10 pounds.  He denies abdominal pain, blood in stools, nausea vomiting.  He has occasional reflux symptoms. He does not currently have a DME company. Previous used adapt, would prefer not to use them again unless absolutely necessary. Epworth scores 9  Airview download 06/27/2023 - 09/24/2023 Usage days 89/90 days (99%) greater than 4 hours Average usage 8 hours 19 minutes Pressure 11 cm H2O Air leaks 21.5 L/min (95%) AHI 7.1   Allergies  Allergen Reactions   Roxicodone [Oxycodone Hcl] Other (See Comments)    hallucinations   Benazepril Cough   Gabapentin Rash   Hydralazine Hives and Rash    Skin lupus type reaction   Hydrocodone Hives   Itraconazole Nausea Only and Rash   Lipitor [Atorvastatin  Calcium]     cramps   Oxycodone Hives    Immunization History  Administered Date(s) Administered   Influenza Inj Mdck Quad With Preservative 10/31/2018, 07/16/2019   Influenza Split 12/28/2012   Influenza Whole 08/18/2010   Influenza, High Dose Seasonal PF 09/05/2016, 09/06/2017, 10/14/2021   Influenza,inj,Quad PF,6+ Mos 07/22/2015   Influenza-Unspecified 01/16/2014   PFIZER(Purple Top)SARS-COV-2 Vaccination 01/02/2020, 01/23/2020, 08/23/2020   Pneumococcal Conjugate-13 01/27/2014   Pneumococcal Polysaccharide-23 08/28/2006   Tdap 03/01/2017    Past Medical History:  Diagnosis Date   Anxiety    Arthritis    CAD (coronary artery disease)    Mild plaque (cath "years ago"); abnormal Myoview 04/2013 with subsequent CABG x 5 with LIMA to LAD, SVG to OM1, SVG to DX, SVG to PD & PL. // Myoview 10/23: EF 62, no ischemia or infarction; low risk   Carotid artery disease (HCC)    Carotid US 07/2019 bilateral ICA 40-59; bilateral subclavian stenosis // Carotid US 9/21: Bilateral ICA 40-59; L VA occluded; R subclavian stenosis   Cataract    GERD (gastroesophageal reflux disease)    History of colonic polyps    History of echocardiogram    Echo 2/19: Mild LVH, EF 60-65, normal wall motion, grade 2 diastolic dysfunction, mild RAE   History of nuclear stress test    Myoview 2/19: inf/inf-lat/apical inf/ant-sept defect (?diaph atten - cannot rule out peri-infarct  ischemia), PVCs/PACs/Mobitz 1 // Myoview 05/2019: EF 60, inf infarct with mild peri-infarct ischemia; no significant change when compared to prior study; Intermediate Risk   Hx of echocardiogram    Echo (9/15):  EF 55-60%; Gr 2 DD, mild BAE   Hyperlipidemia    Hypertension    LBP (low back pain)    Meralgia paresthetica of left side 2011   Polycythemia, secondary 02/03/2020   PVC's (premature ventricular contractions) 08/24/2022   3 day Zio patch monitor 08/2022: There are frequent PVCs occurring at a burden of 10% with no sustained  ventricular arrhythmia.  Echo 10/23: EF 55-60, no RWMA, mild LVH, normal RVSF, mild RAE, mild MR, AV sclerosis without stenosis, RAP 3    PVD (peripheral vascular disease) (HCC)    Stent to left common femoral and right superficial femoral.  2008.  50%  left renal    Second degree AV block, Mobitz type I    Holter 2/19: Sinus rhythm, average heart rate 72, frequent PVCs (burden 5%), second-degree type I AV block and periods of 2:1 heart block >> continue clinical managment and avoid AVN blocking agents   Shortness of breath    "once in awhile; can happen at anytime" (08/26/2013)   Sleep apnea    mod OSA, central sleep apnea/hypoapnea syndrome 11/22/12, CPAP every night    Subclavian artery stenosis (HCC)    Carotid US 07/2019 bilateral ICA 40-59; bilateral subclavian stenosis   Vitamin D deficiency     Tobacco History: Social History   Tobacco Use  Smoking Status Former   Current packs/day: 0.00   Average packs/day: 2.0 packs/day for 47.0 years (94.0 ttl pk-yrs)   Types: Cigarettes   Start date: 12/17/1965   Quit date: 12/17/2012   Years since quitting: 10.7  Smokeless Tobacco Never   Counseling given: Not Answered   Outpatient Medications Prior to Visit  Medication Sig Dispense Refill   acetaminophen (TYLENOL) 650 MG CR tablet Take 1,300 mg by mouth every 8 (eight) hours as needed for pain.     Ascorbic Acid (VITAMIN C PO) Take 1 tablet by mouth in the morning.     aspirin EC 81 MG tablet Take 81 mg by mouth every other day. Swallow whole.     cholecalciferol (VITAMIN D) 1000 units tablet Take 1,000 Units by mouth daily.      clonazePAM (KLONOPIN) 0.5 MG tablet TAKE ONE TABLET BY MOUTH TWICE DAILY AS NEEDED FOR ANXIETY 60 tablet 3   cyclobenzaprine (FLEXERIL) 10 MG tablet Take 1 tablet (10 mg total) by mouth 3 (three) times daily as needed for muscle spasms. 60 tablet 1   doxycycline (VIBRA-TABS) 100 MG tablet Take 1 tablet (100 mg total) by mouth 2 (two) times daily. * TAKE WITH  FOOD ** 60 tablet 3   ezetimibe (ZETIA) 10 MG tablet Take 1 tablet (10 mg total) by mouth daily. 90 tablet 3   furosemide (LASIX) 40 MG tablet TAKE 0.5-1 TABLETS (20-40 MG TOTAL) BY MOUTH DAILY AS NEEDED FOR EDEMA. 90 tablet 2   losartan (COZAAR) 50 MG tablet Take 1.5 tablets (75 mg total) by mouth as directed. TAKE ONE TABLET BY MOUTH EVERY MORNING, AND TAKE 1/2 TABLET EVERY IN THE EVENING 135 tablet 2   metoprolol succinate (TOPROL-XL) 50 MG 24 hr tablet Take 1 tablet (50 mg total) by mouth at bedtime. Take with or immediately following a meal. 90 tablet 3   metroNIDAZOLE (METROCREAM) 0.75 % cream Apply topically 2 (two) times daily. 45 g  3   nitroGLYCERIN (NITROSTAT) 0.4 MG SL tablet Place 1 tablet (0.4 mg total) under the tongue every 5 (five) minutes as needed for chest pain. 25 tablet 11   predniSONE (DELTASONE) 10 MG tablet TAKE 4 TABLETS FOR THREE DAYS, THREE tabs FOR FOUR DAYS, TWO tabs FOR FOUR DAYS, ONE tab FOR SIX DAYS 38 tablet 1   rosuvastatin (CRESTOR) 40 MG tablet Take 1 tablet (40 mg total) by mouth 3 (three) times a week. . 12 tablet 3   tadalafil, PAH, (ADCIRCA) 20 MG tablet Take 20 mg by mouth every 3 (three) days.     traMADol (ULTRAM) 50 MG tablet Take 1 tablet (50 mg total) by mouth every 6 (six) hours as needed. 120 tablet 5   vitamin B-12 (CYANOCOBALAMIN) 1000 MCG tablet Take 1,000 mcg by mouth daily.     No facility-administered medications prior to visit.    Review of Systems  Review of Systems  Constitutional:  Positive for unexpected weight change.  Respiratory: Negative.    Cardiovascular: Negative.    Physical Exam  BP (!) 160/72   Pulse 94   Ht 5\' 5"  (1.651 m)   Wt 171 lb (77.6 kg)   SpO2 98%   BMI 28.46 kg/m  Physical Exam Constitutional:      General: He is not in acute distress.    Appearance: Normal appearance. He is obese. He is not ill-appearing.  HENT:     Head: Normocephalic and atraumatic.     Mouth/Throat:     Mouth: Mucous membranes  are moist.     Pharynx: Oropharynx is clear.  Cardiovascular:     Rate and Rhythm: Normal rate and regular rhythm.  Pulmonary:     Effort: Pulmonary effort is normal.     Breath sounds: Normal breath sounds.  Abdominal:     General: There is distension.     Palpations: Abdomen is soft.     Tenderness: There is no abdominal tenderness.  Musculoskeletal:        General: Normal range of motion.  Skin:    General: Skin is warm and dry.  Neurological:     General: No focal deficit present.     Mental Status: He is alert and oriented to person, place, and time. Mental status is at baseline.  Psychiatric:        Mood and Affect: Mood normal.        Behavior: Behavior normal.        Thought Content: Thought content normal.        Judgment: Judgment normal.      Lab Results:  CBC    Component Value Date/Time   WBC 12.6 (H) 03/14/2023 1114   RBC 4.14 (L) 03/14/2023 1114   HGB 13.6 03/14/2023 1114   HGB 14.0 01/31/2023 1553   HCT 40.4 03/14/2023 1114   HCT 40.3 01/31/2023 1553   PLT 277.0 03/14/2023 1114   PLT 264 01/31/2023 1553   MCV 97.5 03/14/2023 1114   MCV 96 01/31/2023 1553   MCH 33.2 (H) 01/31/2023 1553   MCH 34.2 (H) 07/26/2022 0924   MCHC 33.7 03/14/2023 1114   RDW 14.5 03/14/2023 1114   RDW 12.1 01/31/2023 1553   LYMPHSABS 3.3 03/14/2023 1114   LYMPHSABS 1.9 05/29/2020 1233   MONOABS 1.1 (H) 03/14/2023 1114   EOSABS 0.4 03/14/2023 1114   EOSABS 0.4 05/29/2020 1233   BASOSABS 0.1 03/14/2023 1114   BASOSABS 0.1 05/29/2020 1233    BMET  Component Value Date/Time   NA 139 03/14/2023 1114   NA 140 08/24/2022 1135   K 3.8 03/14/2023 1114   CL 109 03/14/2023 1114   CO2 21 03/14/2023 1114   GLUCOSE 85 03/14/2023 1114   GLUCOSE 101 (H) 09/25/2006 1002   BUN 53 (H) 03/14/2023 1114   BUN 39 (H) 08/24/2022 1135   CREATININE 1.65 (H) 03/14/2023 1114   CREATININE 2.00 (H) 07/26/2022 0924   CALCIUM 9.6 03/14/2023 1114   GFRNONAA 35 (L) 07/26/2022 0924   GFRAA  47 (L) 07/17/2020 0818    BNP    Component Value Date/Time   BNP 97.5 04/25/2016 1819    ProBNP    Component Value Date/Time   PROBNP 571 (H) 08/24/2022 1135    Imaging: No results found.   Assessment & Plan:   OSA on CPAP - Former patient of Dr. Craige Cotta.  Sleep study from 2015 showed mild obstructive sleep apnea, AHI 13.5 an hour with SpO2 low 67%.  Patient has been maintained on CPAP and 99% compliant with use >4 hour last 90 days.  Current pressure 11 cm H2O; Average AHI 7.1/hour  Patient needs an order for a new CPAP machine as current 1 is greater than 31 years old.  Recommend adjusting pressure settings to 13 cm H2O due to residual AHI.  Encourage patient continue to wear CPAP nightly for 4 to 6 hours or longer.  Work on weight loss efforts.  If continues to lose weight unintentionally advised patient to follow back up with primary care.  Follow-up in 3 months for CPAP compliance then annually.  Glenford Bayley, NP 09/25/2023

## 2023-09-25 NOTE — Patient Instructions (Addendum)
You are having some mild residual apneas on current pressure settings 11cm h20. We will place an order for you to receive a new CPAP machine, recommend increasing pressure settings to 13cm h20.   Recommendation: Continue to wear CPAP nightly 4-6 hours or longer Notify PCP if you continue to lose weight unintentionally   Orders: Please recheck blood pressure  New CPAP pressure 13cm h20   Follow-up: 3 months with Beth NP or sooner if needed

## 2023-09-25 NOTE — Assessment & Plan Note (Signed)
-   Former patient of Dr. Craige Cotta.  Sleep study from 2015 showed mild obstructive sleep apnea, AHI 13.5 an hour with SpO2 low 67%.  Patient has been maintained on CPAP and 99% compliant with use >4 hour last 90 days.  Current pressure 11 cm H2O; Average AHI 7.1/hour  Patient needs an order for a new CPAP machine as current 1 is greater than 72 years old.  Recommend adjusting pressure settings to 13 cm H2O due to residual AHI.  Encourage patient continue to wear CPAP nightly for 4 to 6 hours or longer.  Work on weight loss efforts.  If continues to lose weight unintentionally advised patient to follow back up with primary care.  Follow-up in 3 months for CPAP compliance then annually.

## 2023-09-26 ENCOUNTER — Ambulatory Visit (INDEPENDENT_AMBULATORY_CARE_PROVIDER_SITE_OTHER): Payer: Medicare HMO

## 2023-09-26 DIAGNOSIS — I442 Atrioventricular block, complete: Secondary | ICD-10-CM

## 2023-09-28 LAB — CUP PACEART REMOTE DEVICE CHECK
Battery Remaining Longevity: 96 mo
Battery Voltage: 3 V
Brady Statistic AP VP Percent: 56.15 %
Brady Statistic AP VS Percent: 0 %
Brady Statistic AS VP Percent: 42.24 %
Brady Statistic AS VS Percent: 1.6 %
Brady Statistic RA Percent Paced: 56.77 %
Brady Statistic RV Percent Paced: 98.39 %
Date Time Interrogation Session: 20241028235636
Implantable Lead Connection Status: 753985
Implantable Lead Connection Status: 753985
Implantable Lead Implant Date: 20201231
Implantable Lead Implant Date: 20201231
Implantable Lead Location: 753859
Implantable Lead Location: 753860
Implantable Lead Model: 3830
Implantable Lead Model: 5076
Implantable Pulse Generator Implant Date: 20201231
Lead Channel Impedance Value: 323 Ohm
Lead Channel Impedance Value: 399 Ohm
Lead Channel Impedance Value: 475 Ohm
Lead Channel Impedance Value: 551 Ohm
Lead Channel Pacing Threshold Amplitude: 0.625 V
Lead Channel Pacing Threshold Amplitude: 0.75 V
Lead Channel Pacing Threshold Pulse Width: 0.4 ms
Lead Channel Pacing Threshold Pulse Width: 0.4 ms
Lead Channel Sensing Intrinsic Amplitude: 21.625 mV
Lead Channel Sensing Intrinsic Amplitude: 21.625 mV
Lead Channel Sensing Intrinsic Amplitude: 4.75 mV
Lead Channel Sensing Intrinsic Amplitude: 4.75 mV
Lead Channel Setting Pacing Amplitude: 1.5 V
Lead Channel Setting Pacing Amplitude: 2.5 V
Lead Channel Setting Pacing Pulse Width: 0.4 ms
Lead Channel Setting Sensing Sensitivity: 1.2 mV
Zone Setting Status: 755011
Zone Setting Status: 755011

## 2023-10-17 NOTE — Progress Notes (Signed)
Remote pacemaker transmission.   

## 2023-10-31 ENCOUNTER — Telehealth: Payer: Self-pay | Admitting: Internal Medicine

## 2023-10-31 DIAGNOSIS — N184 Chronic kidney disease, stage 4 (severe): Secondary | ICD-10-CM

## 2023-10-31 DIAGNOSIS — I129 Hypertensive chronic kidney disease with stage 1 through stage 4 chronic kidney disease, or unspecified chronic kidney disease: Secondary | ICD-10-CM

## 2023-10-31 NOTE — Telephone Encounter (Signed)
A representative from [facility] called and said patient was requesting a referral. They would like there referral sent to Dr. Cristal Deer Po at [facility]. Their fax number is [phone]. Best callback is [phone].

## 2023-10-31 NOTE — Telephone Encounter (Signed)
Okay.  Thanks.

## 2023-11-09 ENCOUNTER — Telehealth: Payer: Self-pay | Admitting: Internal Medicine

## 2023-11-09 NOTE — Telephone Encounter (Signed)
Prescription Request  11/09/2023  LOV: 03/14/2023  What is the name of the medication or equipment? tadalafil, PAH, (ADCIRCA) 20 MG tablet   Have you contacted your pharmacy to request a refill? Yes   Which pharmacy would you like this sent to?  [facility] - [place], [place] - [address] [place] [address] Phone: [phone] Fax: [phone]    Patient notified that their request is being sent to the clinical staff for review and that they should receive a response within 2 business days.   Please advise at Mobile [phone] (mobile)

## 2023-11-12 MED ORDER — TADALAFIL (PAH) 20 MG PO TABS: 20.0000 mg | ORAL_TABLET | ORAL | 6 refills | Status: DC

## 2023-11-12 NOTE — Telephone Encounter (Signed)
Okay. Thank you.

## 2023-11-13 ENCOUNTER — Other Ambulatory Visit: Payer: Self-pay | Admitting: Internal Medicine

## 2023-11-14 ENCOUNTER — Ambulatory Visit: Payer: Medicare HMO

## 2023-11-14 DIAGNOSIS — E785 Hyperlipidemia, unspecified: Secondary | ICD-10-CM

## 2023-11-14 DIAGNOSIS — Z79899 Other long term (current) drug therapy: Secondary | ICD-10-CM

## 2023-11-30 ENCOUNTER — Encounter: Payer: Self-pay | Admitting: Family

## 2023-11-30 ENCOUNTER — Telehealth: Payer: Medicare HMO | Admitting: Internal Medicine

## 2023-11-30 ENCOUNTER — Other Ambulatory Visit (HOSPITAL_COMMUNITY): Payer: Self-pay

## 2023-11-30 ENCOUNTER — Encounter: Payer: Self-pay | Admitting: Internal Medicine

## 2023-11-30 DIAGNOSIS — E559 Vitamin D deficiency, unspecified: Secondary | ICD-10-CM | POA: Diagnosis not present

## 2023-11-30 DIAGNOSIS — D751 Secondary polycythemia: Secondary | ICD-10-CM | POA: Diagnosis not present

## 2023-11-30 DIAGNOSIS — G629 Polyneuropathy, unspecified: Secondary | ICD-10-CM | POA: Diagnosis not present

## 2023-11-30 DIAGNOSIS — I129 Hypertensive chronic kidney disease with stage 1 through stage 4 chronic kidney disease, or unspecified chronic kidney disease: Secondary | ICD-10-CM

## 2023-11-30 DIAGNOSIS — E538 Deficiency of other specified B group vitamins: Secondary | ICD-10-CM

## 2023-11-30 DIAGNOSIS — I2510 Atherosclerotic heart disease of native coronary artery without angina pectoris: Secondary | ICD-10-CM

## 2023-11-30 MED ORDER — TRAMADOL HCL 50 MG PO TABS: 50.0000 mg | ORAL_TABLET | Freq: Four times a day (QID) | ORAL | 5 refills | Status: DC | PRN

## 2023-11-30 NOTE — Progress Notes (Deleted)
 Subjective:  Patient ID: Douglas Christensen, male    DOB: Apr 25, 1951  Age: 73 y.o. MRN: [id number]  CC: Referral (Pt is needing an updated doctors visit to address Kidney issues as its been over 2 years since pt was seen foe this matter per the specialty office pt was referred to .)   HPI Douglas Christensen presents for LE neuropathy on feet, CAD, dyslipidemia  Outpatient Medications Prior to Visit  Medication Sig Dispense Refill   acetaminophen  (TYLENOL ) 650 MG CR tablet Take 1,300 mg by mouth every 8 (eight) hours as needed for pain.     albuterol  (VENTOLIN  HFA) 108 (90 Base) MCG/ACT inhaler Inhale into the lungs.     Ascorbic Acid (VITAMIN C PO) Take 1 tablet by mouth in the morning.     aspirin  EC 81 MG tablet Take 81 mg by mouth every other day. Swallow whole.     cholecalciferol  (VITAMIN D ) 1000 units tablet Take 1,000 Units by mouth daily.      clonazePAM  (KLONOPIN ) 0.5 MG tablet TAKE ONE TABLET BY MOUTH TWICE DAILY AS NEEDED FOR ANXIETY 60 tablet 3   cyclobenzaprine  (FLEXERIL ) 10 MG tablet Take 1 tablet (10 mg total) by mouth 3 (three) times daily as needed for muscle spasms. 60 tablet 1   furosemide  (LASIX ) 40 MG tablet TAKE 0.5-1 TABLETS (20-40 MG TOTAL) BY MOUTH DAILY AS NEEDED FOR EDEMA. 90 tablet 2   losartan  (COZAAR ) 50 MG tablet Take 1.5 tablets (75 mg total) by mouth as directed. TAKE ONE TABLET BY MOUTH EVERY MORNING, AND TAKE 1/2 TABLET EVERY IN THE EVENING 135 tablet 2   metoprolol  succinate (TOPROL -XL) 50 MG 24 hr tablet Take 1 tablet (50 mg total) by mouth at bedtime. Take with or immediately following a meal. 90 tablet 3   metroNIDAZOLE  (METROCREAM ) 0.75 % cream Apply topically 2 (two) times daily. 45 g 3   nitroGLYCERIN  (NITROSTAT ) 0.4 MG SL tablet Place 1 tablet (0.4 mg total) under the tongue every 5 (five) minutes as needed for chest pain. 25 tablet 11   predniSONE  (DELTASONE ) 10 MG tablet TAKE 4 TABLETS FOR THREE DAYS, THREE tabs FOR FOUR DAYS, TWO tabs FOR FOUR DAYS, ONE tab FOR  SIX DAYS 38 tablet 1   rosuvastatin  (CRESTOR ) 40 MG tablet Take 1 tablet (40 mg total) by mouth 3 (three) times a week. . 12 tablet 3   tadalafil , PAH, (ADCIRCA ) 20 MG tablet Take 1 tablet (20 mg total) by mouth every 3 (three) days. 10 tablet 6   vitamin B-12 (CYANOCOBALAMIN ) 1000 MCG tablet Take 1,000 mcg by mouth daily.     doxycycline  (VIBRA -TABS) 100 MG tablet Take 1 tablet (100 mg total) by mouth 2 (two) times daily. * TAKE WITH FOOD ** 60 tablet 3   traMADol  (ULTRAM ) 50 MG tablet Take 1 tablet (50 mg total) by mouth every 6 (six) hours as needed. 120 tablet 5   ezetimibe  (ZETIA ) 10 MG tablet Take 1 tablet (10 mg total) by mouth daily. 90 tablet 3   No facility-administered medications prior to visit.    ROS: Review of Systems  Objective:  There were no vitals taken for this visit.  BP Readings from Last 3 Encounters:  09/25/23 (!) 160/72  07/24/23 122/64  06/26/23 (!) 179/73    Wt Readings from Last 3 Encounters:  09/25/23 171 lb (77.6 kg)  07/24/23 168 lb (76.2 kg)  06/26/23 160 lb 3.2 oz (72.7 kg)    Physical Exam  Lab Results  Component Value Date   WBC 12.6 (H) 03/14/2023   HGB 13.6 03/14/2023   HCT 40.4 03/14/2023   PLT 277.0 03/14/2023   GLUCOSE 85 03/14/2023   CHOL 154 07/24/2023   TRIG 107 07/24/2023   HDL 37 (L) 07/24/2023   LDLDIRECT 124.0 03/14/2023   LDLCALC 97 07/24/2023   ALT 9 07/24/2023   AST 15 07/24/2023   NA 139 03/14/2023   K 3.8 03/14/2023   CL 109 03/14/2023   CREATININE 1.65 (H) 03/14/2023   BUN 53 (H) 03/14/2023   CO2 21 03/14/2023   TSH 1.10 03/14/2023   PSA 0.86 10/05/2020   INR 1.10 06/22/2015   HGBA1C 6.4 03/14/2023    VAS US  CAROTID Result Date: 07/27/2023 Carotid Arterial Duplex Study Patient Name:  Douglas Christensen  Date of Exam:   07/24/2023 Medical Rec #: [id number]    Accession #:    [id number] Date of Birth: June 04, 1951     Patient Gender: M Patient Age:   3 years Exam Location:  [facility] Procedure:      VAS US  CAROTID  Referring Phys: OZELL COOPER --------------------------------------------------------------------------------  Indications:       Carotid artery disease and patient reports h/o intermittent                    dizziness. He denies any other cerebrovascular symptoms. Risk Factors:      Hypertension, hyperlipidemia, past history of smoking,                    coronary artery disease, PAD. Other Factors:     Covid 19, COPD. Comparison Study:  In 07/2020, a carotid artery duplex showed velocities of                    180/29 cm/s in the RICA and 153/31 cm/s in the LICA. Performing Technologist: Nanetta Shad RVT  Examination Guidelines: A complete evaluation includes B-mode imaging, spectral Doppler, color Doppler, and power Doppler as needed of all accessible portions of each vessel. Bilateral testing is considered an integral part of a complete examination. Limited examinations for reoccurring indications may be performed as noted.  Right Carotid Findings: +----------+--------+--------+--------+---------------------+------------------+           PSV cm/sEDV cm/sStenosisPlaque Description   Comments           +----------+--------+--------+--------+---------------------+------------------+ CCA Prox  114     9       <50%    heterogenous and     turbulent                                            irregular                               +----------+--------+--------+--------+---------------------+------------------+ CCA Mid   138     15      <50%    calcific and         Shadowing                                            irregular                               +----------+--------+--------+--------+---------------------+------------------+  CCA Distal101     8       <50%    heterogenous                            +----------+--------+--------+--------+---------------------+------------------+ ICA Prox  117     14              calcific and                                                               irregular                               +----------+--------+--------+--------+---------------------+------------------+ ICA Mid   161     17      40-59%  calcific and         turbulent,                                           irregular            stenosis based on                                                         peak systolic                                                             velocities         +----------+--------+--------+--------+---------------------+------------------+ ICA Distal139     20                                   turbulent          +----------+--------+--------+--------+---------------------+------------------+ ECA       176     9               calcific             shadowing          +----------+--------+--------+--------+---------------------+------------------+ +----------+--------+-------+--------+-------------------+           PSV cm/sEDV cmsDescribeArm Pressure (mmHG) +----------+--------+-------+--------+-------------------+ Subclavian181                    177                 +----------+--------+-------+--------+-------------------+ +---------+--------+--+--------+--+ VertebralPSV cm/s69EDV cm/s16 +---------+--------+--+--------+--+  Left Carotid Findings: +----------+--------+--------+--------+---------------------+------------------+           PSV cm/sEDV cm/sStenosisPlaque Description   Comments           +----------+--------+--------+--------+---------------------+------------------+ CCA Prox  59      12      <50%    heterogenous                            +----------+--------+--------+--------+---------------------+------------------+  CCA Mid   128     21      <50%    calcific and                                                              irregular                                +----------+--------+--------+--------+---------------------+------------------+ CCA Distal239     39      >50%    calcific and         Shadowing                                            irregular                               +----------+--------+--------+--------+---------------------+------------------+ ICA Prox  291     43      40-59%  calcific             turbulent,                                                                shadowing          +----------+--------+--------+--------+---------------------+------------------+ ICA Mid   201     23              calcific             turbulent          +----------+--------+--------+--------+---------------------+------------------+ ICA Distal162     26              heterogenous         turbulent          +----------+--------+--------+--------+---------------------+------------------+ ECA       0       0       Occludedcalcific             shadowing,                                                                possible                                                                  occlusion,  reconstitutes                                                             further distally   +----------+--------+--------+--------+---------------------+------------------+ +----------+--------+--------+----------------+-------------------+           PSV cm/sEDV cm/sDescribe        Arm Pressure (mmHG) +----------+--------+--------+----------------+-------------------+ [id number]             Multiphasic, [id number]                 +----------+--------+--------+----------------+-------------------+ +---------+--------+--------+--------------+ VertebralPSV cm/sEDV cm/sNot identified +---------+--------+--------+--------------+   Summary: Right Carotid: Velocities in the right ICA are consistent with a 40-59%                 stenosis. Non-hemodynamically significant plaque <50% noted in                the CCA. RICA stenosis based on peak systolic velocities. Left Carotid: Velocities in the left ICA are consistent with a 40-59% stenosis.               Hemodynamically significant plaque >50% visualized in the CCA. The               ECA appears occluded. Vertebrals:  Right vertebral artery demonstrates antegrade flow. Left vertebral              artery was not visualized. Subclavians: Right subclavian artery flow was disturbed. Normal flow              hemodynamics were seen in the left subclavian artery. *See table(s) above for measurements and observations. Suggest follow up study in 12 months. Electronically signed by Deatrice Cage MD on 07/27/2023 at 8:31:09 AM.    Final     Assessment & Plan:   Problem List Items Addressed This Visit     Neuropathy - Primary   Use Blue-Emu cream  use 2-3 times a day on feet         Meds ordered this encounter  Medications   traMADol  (ULTRAM ) 50 MG tablet    Sig: Take 1 tablet (50 mg total) by mouth every 6 (six) hours as needed.    Dispense:  120 tablet    Refill:  5      Follow-up: No follow-ups on file.  Marolyn Noel, MD

## 2023-11-30 NOTE — Assessment & Plan Note (Signed)
On B complex 

## 2023-11-30 NOTE — Assessment & Plan Note (Signed)
 Continue to take vitamin D

## 2023-11-30 NOTE — Telephone Encounter (Signed)
 Patient is needing the referral to be resent he called the specialist and they do not have the referral he would like a cALL BACK REGARDING THIS

## 2023-11-30 NOTE — Assessment & Plan Note (Signed)
 Hydrate well Check GFR Referral to Nephrology, Dr Po was made

## 2023-11-30 NOTE — Assessment & Plan Note (Addendum)
 Use Blue-Emu cream  use 2-3 times a day on feet Continue tramadol  as needed  Potential benefits of a long term opioids use as well as potential risks (i.e. addiction risk, apnea etc) and complications (i.e. Somnolence, constipation and others) were explained to the patient and were aknowledged.

## 2023-11-30 NOTE — Progress Notes (Signed)
 Virtual Visit via Video Note  I connected with [place] on 11/30/23 at 11:00 AM EST by a video enabled telemedicine application and verified that I am speaking with the correct person using two identifiers.   I discussed the limitations of evaluation and management by telemedicine and the availability of in person appointments. The patient expressed understanding and agreed to proceed.  I was located at our [facility] office. The patient was at home. There was no one else present in the visit.  Chief Complaint  Patient presents with   Referral    Pt is needing an updated doctors visit to address Kidney issues as its been over 2 years since pt was seen foe this matter per the specialty office pt was referred to .     History of Present Illness: The patient is complaining of burning sensation on both feet of several months duration.  Tramadol  helps a little.  His feet are not turning cold or bluish. Follow-up on dyslipidemia, coronary disease.  He had to stop his Zetia  due to upset stomach. Follow-up on chronic renal disease, low back pain, arthritis  Review of Systems  Constitutional:  Negative for fever and weight loss.  HENT:  Negative for congestion.   Eyes:  Negative for double vision.  Respiratory:  Negative for shortness of breath and wheezing.   Cardiovascular:  Positive for leg swelling. Negative for claudication.  Musculoskeletal:  Positive for back pain and joint pain.  Neurological:  Positive for tingling. Negative for focal weakness, seizures and loss of consciousness.  Psychiatric/Behavioral:  Negative for depression and memory loss. The patient is nervous/anxious and has insomnia.      Observations/Objective: The patient appears to be in no acute distress  Assessment and Plan:  Problem List Items Addressed This Visit     Polycythemia, secondary (Chronic)   Monitor CBC.  Phlebotomy as needed      Vitamin D  deficiency   Continue to take vitamin D        Hypertension, renal disease, stage 1-4 or unspecified chronic kidney disease   Hydrate well Check GFR Referral to Nephrology, Dr Po was made      CAD (coronary artery disease)   On Crestor , baby ASA The patient discontinued Zetia  due to side effects      B12 deficiency   On B complex      Neuropathy - Primary   Use Blue-Emu cream  use 2-3 times a day on feet Continue tramadol  as needed  Potential benefits of a long term opioids use as well as potential risks (i.e. addiction risk, apnea etc) and complications (i.e. Somnolence, constipation and others) were explained to the patient and were aknowledged.         Meds ordered this encounter  Medications   traMADol  (ULTRAM ) 50 MG tablet    Sig: Take 1 tablet (50 mg total) by mouth every 6 (six) hours as needed.    Dispense:  120 tablet    Refill:  5     Follow Up Instructions:    I discussed the assessment and treatment plan with the patient. The patient was provided an opportunity to ask questions and all were answered. The patient agreed with the plan and demonstrated an understanding of the instructions.   The patient was advised to call back or seek an in-person evaluation if the symptoms worsen or if the condition fails to improve as anticipated.  I provided face-to-face time during this encounter. We were at different locations.  Marolyn Noel, MD

## 2023-11-30 NOTE — Assessment & Plan Note (Signed)
 Monitor CBC.  Phlebotomy as needed

## 2023-11-30 NOTE — Assessment & Plan Note (Signed)
 On Crestor, baby ASA The patient discontinued Zetia due to side effects

## 2023-11-30 NOTE — Patient Instructions (Addendum)
 Use Blue-Emu cream  use 2-3 times a day on feet

## 2023-12-01 ENCOUNTER — Other Ambulatory Visit: Payer: Self-pay | Admitting: Physician Assistant

## 2023-12-18 ENCOUNTER — Other Ambulatory Visit: Payer: Self-pay | Admitting: Physician Assistant

## 2023-12-25 ENCOUNTER — Ambulatory Visit (HOSPITAL_COMMUNITY): Payer: Medicare HMO

## 2023-12-25 ENCOUNTER — Ambulatory Visit: Payer: Medicare HMO

## 2023-12-26 ENCOUNTER — Telehealth: Payer: Medicare HMO | Admitting: Primary Care

## 2023-12-26 ENCOUNTER — Encounter: Payer: Self-pay | Admitting: Primary Care

## 2023-12-26 ENCOUNTER — Ambulatory Visit: Payer: Medicare HMO

## 2023-12-26 VITALS — Ht 64.0 in | Wt 170.0 lb

## 2023-12-26 DIAGNOSIS — G4733 Obstructive sleep apnea (adult) (pediatric): Secondary | ICD-10-CM | POA: Diagnosis not present

## 2023-12-26 NOTE — Progress Notes (Signed)
Virtual Visit via Video Note  I connected with [place] on 12/26/23 at  9:00 AM EST by a video enabled telemedicine application and verified that I am speaking with the correct person using two identifiers.  Location: Patient: Home Provider: Office   I discussed the limitations of evaluation and management by telemedicine and the availability of in person appointments. The patient expressed understanding and agreed to proceed.  History of Present Illness: 73 year old male, former smoker quit in 2014.  Past medical history significant for hypertension, coronary artery disease post CABG, hyperlipidemia, first-degree AV block, COPD, OSA on CPAP, GERD, chronic kidney disease, polycythemia, obesity.   Previous [facility] pulmonary encounter: 09/25/2023  Patient presents today for sleep consult. He lived in [place] for 70 years, moved to [facility] but choose to keep his doctors here locally. History of sleep apnea, currently on CPAP.  He needs an order for new CPAP machine. Experiencing large amount of air leaks.   Current pressure 11 cm H2O, residual AHI 7.1/hour.  Patient reports benefit from CPAP use.  Typical bedtime 11 PM.  Takes him on average 5 minutes or less to fall asleep.  He wakes up 0-3 times a night.  He starts his day between 6 and 9 AM.  He lost 30 pounds in the last 2 years, he did mention this to his primary care.  He has gained back 10 pounds.  He denies abdominal pain, blood in stools, nausea vomiting.  He has occasional reflux symptoms. He does not currently have a DME company. Previous used [employer], would prefer not to use them again unless absolutely necessary. Epworth scores 9  Airview download 06/27/2023 - 09/24/2023 Usage days 89/90 days (99%) greater than 4 hours Average usage 8 hours 19 minutes Pressure 11 cm H2O Air leaks 21.5 L/min (95%) AHI 7.1  OSA on CPAP - Former patient of Dr. Craige Cotta.  Sleep study from 2015 showed mild obstructive sleep apnea, AHI 13.5 an hour with  SpO2 low 67%.  Patient has been maintained on CPAP and 99% compliant with use >4 hour last 90 days.  Current pressure 11 cm H2O; Average AHI 7.1/hour  Patient needs an order for a new CPAP machine as current 1 is greater than 10 years old.  Recommend adjusting pressure settings to 13 cm H2O due to residual AHI.  Encourage patient continue to wear CPAP nightly for 4 to 6 hours or longer.  Work on weight loss efforts.  If continues to lose weight unintentionally advised patient to follow back up with primary care.  Follow-up in 3 months for CPAP compliance then annually.    12/26/2023 - Interim hx  Discussed the use of AI scribe software for clinical note transcription with the patient, who gave verbal consent to proceed.  History of Present Illness   The patient, with obstructive sleep apnea, presents with issues related to CPAP machine use and settings.  He is experiencing issues with his CPAP machine, specifically related to the pressure settings and air leaks. Previously on a BiPAP machine as noted in a 2020 visit, he is now using a CPAP machine. The pressure was increased from 11 to 14 cm H2O due to persistent apneic events, approximately seven per hour, but there is no noticeable change in comfort or reduction in apneas. He is now experiencing significant air leaks.  He is using a new mask, the AirFit F20 by [employer], which covers his nose and mouth. The mask does not have a piece that goes up  the middle, and he is unsure if it fits properly as it sometimes shows a red face indicating leaks. He does not notice the mask coming off during the night but mentions adjusting it when it runs out of water.  He is currently obtaining his CPAP supplies from [facility] in [place] but wishes to switch to [employer] in [place], [place], as [place] closer to his location. He has a new CPAP machine and is experiencing issues with the humidification setting. He reports running out of water after  four hours of use, despite trying different humidification settings (2, 3, and 4) without improvement. He has not tried setting 1 yet. He uses an older machine's tank to swap when the current one runs out.  He typically goes to bed around 11 PM and wakes up between 6 and 9 AM, getting about nine hours of sleep. He reports sleeping well until the water runs out, at which point he wakes up feeling bad and sometimes gasping. He does not have anyone to confirm if he snores.     Current DME company with [employer] in [place], he would like to change to [facility] medical supply in [place]  for CPAP supplies in the future.   Airview download 11/25/2023 - 12/24/2023 Usage days 30/30 days (100%) Average usage 9 hours 11 minutes Current pressure 14 cm H2O Air leak 70.5 L/min (95%) AHI 7.1   Observations/Objective:  Appears well without overt respiratory symptoms   Sleep testing: HST 11/23/12 >> AHI 15.8, SpO2 low 83%, both obstructive and central events BiPAP 02/21/14 >> BiPAP 17/13 cm H2O >> AHI 0, + R.  Centrals with lower pressures. BiPAP 09/20/14 to 10/19/14 >> used on 30 of 30 nights with average 6 hrs 9 min.  Average AHI 3.3 with BiPAP 14/10 cm H2O PSG 02/21/2014>> mild OSA, AHI 13.5/h with SpO2 low 67%.  Assessment and Plan:  1. OSA on CPAP (Primary) - Ambulatory Referral for DME     Obstructive Sleep Apnea Despite increasing CPAP pressure from 11 to 14 cmH2O, patient continues to have approximately 7 apneic events per hour. Noted significant air leaks with current mask ([employer] AirFit F20). Patient reports no significant discomfort or noticeable air leaks. Humidification tank runs out of water after approximately 4 hours, causing discomfort and awakening. -Change CPAP settings to auto-pressure 6-16cm h20  -Advise patient to try humidification setting at 1 to potentially extend duration of water supply. -Recommend patient to contact current medical supply company ([facility] in [place])  to evaluate CPAP machine and mask fit due to significant air leaks. -Potential future change of medical supply company to [employer] in [place], [place], per patient's preference.      Follow Up Instructions: -Patient to follow up in a couple of weeks via MyChart message to provider to assess response to changes.   I discussed the assessment and treatment plan with the patient. The patient was provided an opportunity to ask questions and all were answered. The patient agreed with the plan and demonstrated an understanding of the instructions.   The patient was advised to call back or seek an in-person evaluation if the symptoms worsen or if the condition fails to improve as anticipated.  I provided 22 minutes of non-face-to-face time during this encounter.   Glenford Bayley, NP

## 2024-01-02 ENCOUNTER — Telehealth: Payer: Self-pay | Admitting: Internal Medicine

## 2024-01-02 NOTE — Telephone Encounter (Signed)
 Copied from CRM [id number]. Topic: Referral - Question >> Jan 02, 2024  2:56 PM Joanell B wrote: Reason for CRM: [facility] called from Dr. Lonni Fellows office regarding a referal and is not able to process due to not recieveing any labs. Callback number is [phone] and Fax# [phone]

## 2024-01-03 ENCOUNTER — Encounter: Payer: Self-pay | Admitting: Family

## 2024-01-03 NOTE — Telephone Encounter (Signed)
Copied from CRM [id number]. Topic: Referral - Status >> Jan 03, 2024 10:09 AM [place] wrote: Reason for CRM: Patient calling to check the status of the referral for his kidney specialist.

## 2024-01-03 NOTE — Telephone Encounter (Signed)
 Spoke with Nephrology office and they have stated pt needs recent F2F notes a/w labs. They are asking that PCP have pt do lab work for a BMP or CMP and a CBC.  **I LVM for pt to please call the office to set up an IN OFFICE visit with provider.

## 2024-01-22 ENCOUNTER — Telehealth: Payer: Self-pay | Admitting: Internal Medicine

## 2024-01-22 NOTE — Telephone Encounter (Unsigned)
 Copied from CRM [id number]. Topic: Clinical - Lab/Test Results >> Jan 22, 2024  9:25 AM Irine Seal wrote: Reason for CRM: Patient stated he now resides out of town but continues to see Dr. Posey Rea. He reported going to a hospital in [facility] last week for lab work, and was informed that the results would be sent to the office. I called [facility] to inquire if the labs had been received, but [facility] confirmed they have not yet been received. The patient plans to contact the hospital directly to request the labs be sent to the office.  The patient asked for guidance on the next steps. He was advised to follow up with the hospital; however, he also requested if the clinic could assist in requesting the labs. The patient requires the labs in order to schedule a physical and secure a referral for the nephrologist.

## 2024-01-24 ENCOUNTER — Telehealth: Payer: Self-pay | Admitting: Internal Medicine

## 2024-01-24 NOTE — Telephone Encounter (Signed)
 Copied from CRM [id number]. Topic: Clinical - Medical Advice >> Jan 24, 2024 11:58 AM Ernst Spell wrote: Reason for CRM: pt asked for a nurse to give him a call to discuss his lab results because he needs clarification on his "red & white blood count". Please call back and advise.

## 2024-01-24 NOTE — Telephone Encounter (Signed)
 Pt would have to reach out to the medical records department of that facility he was seen at and request that they sed them to the providers office he will be seen at to follow his care.

## 2024-01-29 NOTE — Telephone Encounter (Signed)
 The last time a [place] had lab work here was in April 2024. Thank you

## 2024-02-27 ENCOUNTER — Ambulatory Visit (INDEPENDENT_AMBULATORY_CARE_PROVIDER_SITE_OTHER)

## 2024-02-27 DIAGNOSIS — I442 Atrioventricular block, complete: Secondary | ICD-10-CM | POA: Diagnosis not present

## 2024-02-28 LAB — CUP PACEART REMOTE DEVICE CHECK
Battery Remaining Longevity: 94 mo
Battery Voltage: 2.99 V
Brady Statistic AP VP Percent: 68.66 %
Brady Statistic AP VS Percent: 0 %
Brady Statistic AS VP Percent: 26.14 %
Brady Statistic AS VS Percent: 5.19 %
Brady Statistic RA Percent Paced: 71.14 %
Brady Statistic RV Percent Paced: 94.8 %
Date Time Interrogation Session: 20250401115951
Implantable Lead Connection Status: 753985
Implantable Lead Connection Status: 753985
Implantable Lead Implant Date: 20201231
Implantable Lead Implant Date: 20201231
Implantable Lead Location: 753859
Implantable Lead Location: 753860
Implantable Lead Model: 3830
Implantable Lead Model: 5076
Implantable Pulse Generator Implant Date: 20201231
Lead Channel Impedance Value: 323 Ohm
Lead Channel Impedance Value: 437 Ohm
Lead Channel Impedance Value: 456 Ohm
Lead Channel Impedance Value: 608 Ohm
Lead Channel Pacing Threshold Amplitude: 0.5 V
Lead Channel Pacing Threshold Amplitude: 0.75 V
Lead Channel Pacing Threshold Pulse Width: 0.4 ms
Lead Channel Pacing Threshold Pulse Width: 0.4 ms
Lead Channel Sensing Intrinsic Amplitude: 16.5 mV
Lead Channel Sensing Intrinsic Amplitude: 16.5 mV
Lead Channel Sensing Intrinsic Amplitude: 4.625 mV
Lead Channel Sensing Intrinsic Amplitude: 4.625 mV
Lead Channel Setting Pacing Amplitude: 1.5 V
Lead Channel Setting Pacing Amplitude: 2.5 V
Lead Channel Setting Pacing Pulse Width: 0.4 ms
Lead Channel Setting Sensing Sensitivity: 1.2 mV
Zone Setting Status: 755011
Zone Setting Status: 755011

## 2024-03-07 ENCOUNTER — Encounter: Payer: Self-pay | Admitting: Cardiovascular Disease

## 2024-03-15 ENCOUNTER — Other Ambulatory Visit: Payer: Self-pay | Admitting: Physician Assistant

## 2024-03-20 ENCOUNTER — Encounter: Payer: Self-pay | Admitting: Internal Medicine

## 2024-03-20 ENCOUNTER — Ambulatory Visit (INDEPENDENT_AMBULATORY_CARE_PROVIDER_SITE_OTHER): Payer: Medicare Other | Admitting: Internal Medicine

## 2024-03-20 VITALS — BP 138/78 | HR 60 | Temp 97.7°F | Ht 64.0 in | Wt 167.0 lb

## 2024-03-20 DIAGNOSIS — Z860100 Personal history of colon polyps, unspecified: Secondary | ICD-10-CM

## 2024-03-20 DIAGNOSIS — R5383 Other fatigue: Secondary | ICD-10-CM | POA: Diagnosis not present

## 2024-03-20 DIAGNOSIS — E559 Vitamin D deficiency, unspecified: Secondary | ICD-10-CM | POA: Diagnosis not present

## 2024-03-20 DIAGNOSIS — Z0000 Encounter for general adult medical examination without abnormal findings: Secondary | ICD-10-CM

## 2024-03-20 DIAGNOSIS — E538 Deficiency of other specified B group vitamins: Secondary | ICD-10-CM

## 2024-03-20 DIAGNOSIS — G8929 Other chronic pain: Secondary | ICD-10-CM

## 2024-03-20 DIAGNOSIS — M5440 Lumbago with sciatica, unspecified side: Secondary | ICD-10-CM

## 2024-03-20 DIAGNOSIS — N189 Chronic kidney disease, unspecified: Secondary | ICD-10-CM | POA: Diagnosis not present

## 2024-03-20 DIAGNOSIS — D751 Secondary polycythemia: Secondary | ICD-10-CM | POA: Diagnosis not present

## 2024-03-20 DIAGNOSIS — I129 Hypertensive chronic kidney disease with stage 1 through stage 4 chronic kidney disease, or unspecified chronic kidney disease: Secondary | ICD-10-CM

## 2024-03-20 LAB — VITAMIN D 25 HYDROXY (VIT D DEFICIENCY, FRACTURES): VITD: 34.01 ng/mL (ref 30.00–100.00)

## 2024-03-20 LAB — URIC ACID: Uric Acid, Serum: 5.5 mg/dL (ref 4.0–7.8)

## 2024-03-20 LAB — URINALYSIS
Bilirubin Urine: NEGATIVE
Hgb urine dipstick: NEGATIVE
Ketones, ur: NEGATIVE
Leukocytes,Ua: NEGATIVE
Nitrite: NEGATIVE
Specific Gravity, Urine: 1.02 (ref 1.000–1.030)
Urine Glucose: NEGATIVE
Urobilinogen, UA: 0.2 (ref 0.0–1.0)
pH: 6 (ref 5.0–8.0)

## 2024-03-20 LAB — CBC WITH DIFFERENTIAL/PLATELET
Basophils Absolute: 0.1 10*3/uL (ref 0.0–0.1)
Basophils Relative: 1.2 % (ref 0.0–3.0)
Eosinophils Absolute: 0.3 10*3/uL (ref 0.0–0.7)
Eosinophils Relative: 3.5 % (ref 0.0–5.0)
HCT: 47.9 % (ref 39.0–52.0)
Hemoglobin: 16.2 g/dL (ref 13.0–17.0)
Lymphocytes Relative: 30.5 % (ref 12.0–46.0)
Lymphs Abs: 2.6 10*3/uL (ref 0.7–4.0)
MCHC: 33.7 g/dL (ref 30.0–36.0)
MCV: 97.1 fl (ref 78.0–100.0)
Monocytes Absolute: 0.7 10*3/uL (ref 0.1–1.0)
Monocytes Relative: 7.8 % (ref 3.0–12.0)
Neutro Abs: 4.9 10*3/uL (ref 1.4–7.7)
Neutrophils Relative %: 57 % (ref 43.0–77.0)
Platelets: 274 10*3/uL (ref 150.0–400.0)
RBC: 4.94 Mil/uL (ref 4.22–5.81)
RDW: 13.5 % (ref 11.5–15.5)
WBC: 8.6 10*3/uL (ref 4.0–10.5)

## 2024-03-20 LAB — COMPREHENSIVE METABOLIC PANEL WITH GFR
ALT: 11 U/L (ref 0–53)
AST: 16 U/L (ref 0–37)
Albumin: 4.1 g/dL (ref 3.5–5.2)
Alkaline Phosphatase: 67 U/L (ref 39–117)
BUN: 25 mg/dL — ABNORMAL HIGH (ref 6–23)
CO2: 24 meq/L (ref 19–32)
Calcium: 9.4 mg/dL (ref 8.4–10.5)
Chloride: 108 meq/L (ref 96–112)
Creatinine, Ser: 1.2 mg/dL (ref 0.40–1.50)
GFR: 60.18 mL/min (ref >60.00)
Glucose, Bld: 73 mg/dL (ref 70–99)
Potassium: 5 meq/L (ref 3.5–5.1)
Sodium: 138 meq/L (ref 135–145)
Total Bilirubin: 0.4 mg/dL (ref 0.2–1.2)
Total Protein: 6.7 g/dL (ref 6.0–8.3)

## 2024-03-20 LAB — VITAMIN B12: Vitamin B-12: 437 pg/mL (ref 211–911)

## 2024-03-20 LAB — PSA: PSA: 1.3 ng/mL (ref 0.10–4.00)

## 2024-03-20 LAB — LIPID PANEL
Cholesterol: 127 mg/dL (ref 0–200)
HDL: 34.9 mg/dL — ABNORMAL LOW (ref >39.00)
LDL Cholesterol: 63 mg/dL (ref 0–99)
NonHDL: 92.27
Total CHOL/HDL Ratio: 4
Triglycerides: 146 mg/dL (ref 0.0–149.0)
VLDL: 29.2 mg/dL (ref 0.0–40.0)

## 2024-03-20 LAB — SEDIMENTATION RATE: Sed Rate: 18 mm/h (ref 0–20)

## 2024-03-20 LAB — TSH: TSH: 2 u[IU]/mL (ref 0.35–5.50)

## 2024-03-20 LAB — T4, FREE: Free T4: 0.87 ng/dL (ref 0.60–1.60)

## 2024-03-20 MED ORDER — LOSARTAN POTASSIUM 100 MG PO TABS: 100.0000 mg | ORAL_TABLET | Freq: Every day | ORAL | Status: DC

## 2024-03-20 MED ORDER — TRAMADOL HCL 50 MG PO TABS: 50.0000 mg | ORAL_TABLET | Freq: Four times a day (QID) | ORAL | 5 refills | Status: DC | PRN

## 2024-03-20 MED ORDER — TADALAFIL (PAH) 20 MG PO TABS: 20.0000 mg | ORAL_TABLET | ORAL | 6 refills | Status: DC

## 2024-03-20 NOTE — Assessment & Plan Note (Addendum)
 CFS Worse a little Check labs - treat pain

## 2024-03-20 NOTE — Assessment & Plan Note (Signed)
On B complex 

## 2024-03-20 NOTE — Assessment & Plan Note (Signed)
 Monitor CBC.  Phlebotomy as needed

## 2024-03-20 NOTE — Patient Instructions (Signed)
 Foot Massager for Plantar Fasciitis, Neuropathy, Circulation and Pain Relief for Foot Calf.

## 2024-03-20 NOTE — Progress Notes (Signed)
 Subjective:  Patient ID: Douglas Christensen, male    DOB: 01/28/1951  Age: 73 y.o. MRN: [id number]  CC: Annual Exam (Annual Exam)   HPI Douglas Christensen presents for a well exam C/o leg pain and fatigue - chronic  Outpatient Medications Prior to Visit  Medication Sig Dispense Refill   acetaminophen  (TYLENOL ) 650 MG CR tablet Take 1,300 mg by mouth every 8 (eight) hours as needed for pain.     Ascorbic Acid (VITAMIN C PO) Take 1 tablet by mouth in the morning.     aspirin  EC 81 MG tablet Take 81 mg by mouth every other day. Swallow whole.     cholecalciferol  (VITAMIN D ) 1000 units tablet Take 1,000 Units by mouth daily.      clonazePAM  (KLONOPIN ) 0.5 MG tablet TAKE ONE TABLET BY MOUTH TWICE DAILY AS NEEDED FOR ANXIETY 60 tablet 3   cyclobenzaprine  (FLEXERIL ) 10 MG tablet Take 1 tablet (10 mg total) by mouth 3 (three) times daily as needed for muscle spasms. 60 tablet 1   furosemide  (LASIX ) 40 MG tablet TAKE 0.5-1 TABLETS (20-40 MG TOTAL) BY MOUTH DAILY AS NEEDED FOR EDEMA. 90 tablet 2   metoprolol  succinate (TOPROL -XL) 50 MG 24 hr tablet Take 1.5 tablets (75 mg total) by mouth 2 (two) times daily. Take with or immediately following a meal. 270 tablet 2   metroNIDAZOLE  (METROCREAM ) 0.75 % cream Apply topically 2 (two) times daily. 45 g 3   nitroGLYCERIN  (NITROSTAT ) 0.4 MG SL tablet Place 1 tablet (0.4 mg total) under the tongue every 5 (five) minutes as needed for chest pain. 25 tablet 11   rosuvastatin  (CRESTOR ) 40 MG tablet Take 1 tablet (40 mg total) by mouth 3 (three) times a week.. 12 tablet 3   vitamin B-12 (CYANOCOBALAMIN ) 1000 MCG tablet Take 1,000 mcg by mouth daily.     losartan  (COZAAR ) 50 MG tablet Take 1.5 tablets (75 mg total) by mouth as directed. TAKE ONE TABLET BY MOUTH EVERY MORNING, AND TAKE 1/2 TABLET EVERY IN THE EVENING 135 tablet 0   predniSONE  (DELTASONE ) 10 MG tablet TAKE 4 TABLETS FOR THREE DAYS, THREE tabs FOR FOUR DAYS, TWO tabs FOR FOUR DAYS, ONE tab FOR SIX DAYS 38 tablet 1    tadalafil , PAH, (ADCIRCA ) 20 MG tablet Take 1 tablet (20 mg total) by mouth every 3 (three) days. 10 tablet 6   traMADol  (ULTRAM ) 50 MG tablet Take 1 tablet (50 mg total) by mouth every 6 (six) hours as needed. 120 tablet 5   No facility-administered medications prior to visit.    ROS: Review of Systems  Constitutional:  Negative for appetite change, fatigue and unexpected weight change.  HENT:  Negative for congestion, nosebleeds, sneezing, sore throat and trouble swallowing.   Eyes:  Negative for itching and visual disturbance.  Respiratory:  Negative for cough.   Cardiovascular:  Negative for chest pain, palpitations and leg swelling.  Gastrointestinal:  Negative for abdominal distention, blood in stool, diarrhea and nausea.  Genitourinary:  Negative for frequency and hematuria.  Musculoskeletal:  Positive for arthralgias, back pain and gait problem. Negative for joint swelling, neck pain and neck stiffness.  Skin:  Negative for rash.  Neurological:  Negative for dizziness, tremors, speech difficulty, weakness and headaches.  Psychiatric/Behavioral:  Negative for agitation, dysphoric mood, sleep disturbance and suicidal ideas. The patient is nervous/anxious.     Objective:  BP 138/78   Pulse 60   Temp 97.7 F (36.5 C)   Ht 5\' 4"  (  1.626 m)   Wt 167 lb (75.8 kg)   SpO2 99%   BMI 28.67 kg/m   BP Readings from Last 3 Encounters:  03/20/24 138/78  09/25/23 (!) 160/72  07/24/23 122/64    Wt Readings from Last 3 Encounters:  03/20/24 167 lb (75.8 kg)  12/26/23 170 lb (77.1 kg)  09/25/23 171 lb (77.6 kg)    Physical Exam Constitutional:      General: He is not in acute distress.    Appearance: He is well-developed. He is obese.     Comments: NAD  Eyes:     Conjunctiva/sclera: Conjunctivae normal.     Pupils: Pupils are equal, round, and reactive to light.  Neck:     Thyroid : No thyromegaly.     Vascular: No JVD.  Cardiovascular:     Rate and Rhythm: Normal rate  and regular rhythm.     Heart sounds: Normal heart sounds. No murmur heard.    No friction rub. No gallop.  Pulmonary:     Effort: Pulmonary effort is normal. No respiratory distress.     Breath sounds: Normal breath sounds. No wheezing or rales.  Chest:     Chest wall: No tenderness.  Abdominal:     General: Bowel sounds are normal. There is no distension.     Palpations: Abdomen is soft. There is no mass.     Tenderness: There is no abdominal tenderness. There is no guarding or rebound.  Musculoskeletal:        General: Tenderness present. Normal range of motion.     Cervical back: Normal range of motion.  Lymphadenopathy:     Cervical: No cervical adenopathy.  Skin:    General: Skin is warm and dry.     Findings: No rash.  Neurological:     Mental Status: He is alert and oriented to person, place, and time.     Cranial Nerves: No cranial nerve deficit.     Motor: No abnormal muscle tone.     Coordination: Coordination normal.     Gait: Gait normal.     Deep Tendon Reflexes: Reflexes are normal and symmetric.  Psychiatric:        Behavior: Behavior normal.        Thought Content: Thought content normal.        Judgment: Judgment normal.   Rectal - per GI  Lab Results  Component Value Date   WBC 12.6 (H) 03/14/2023   HGB 13.6 03/14/2023   HCT 40.4 03/14/2023   PLT 277.0 03/14/2023   GLUCOSE 85 03/14/2023   CHOL 154 07/24/2023   TRIG 107 07/24/2023   HDL 37 (L) 07/24/2023   LDLDIRECT 124.0 03/14/2023   LDLCALC 97 07/24/2023   ALT 9 07/24/2023   AST 15 07/24/2023   NA 139 03/14/2023   K 3.8 03/14/2023   CL 109 03/14/2023   CREATININE 1.65 (H) 03/14/2023   BUN 53 (H) 03/14/2023   CO2 21 03/14/2023   TSH 1.10 03/14/2023   PSA 0.86 10/05/2020   INR 1.10 06/22/2015   HGBA1C 6.4 03/14/2023    VAS US  CAROTID Result Date: 07/27/2023 Carotid Arterial Duplex Study Patient Name:  Douglas Christensen  Date of Exam:   07/24/2023 Medical Rec #: [id number]    Accession #:     [id number] Date of Birth: 1951-11-10     Patient Gender: M Patient Age:   81 years Exam Location:  [facility] Procedure:      VAS US  CAROTID Referring Phys:  MICHAEL COOPER --------------------------------------------------------------------------------  Indications:       Carotid artery disease and patient reports h/o intermittent                    dizziness. He denies any other cerebrovascular symptoms. Risk Factors:      Hypertension, hyperlipidemia, past history of smoking,                    coronary artery disease, PAD. Other Factors:     Covid 19, COPD. Comparison Study:  In 07/2020, a carotid artery duplex showed velocities of                    180/29 cm/s in the RICA and 153/31 cm/s in the LICA. Performing Technologist: Doren Gammons RVT  Examination Guidelines: A complete evaluation includes B-mode imaging, spectral Doppler, color Doppler, and power Doppler as needed of all accessible portions of each vessel. Bilateral testing is considered an integral part of a complete examination. Limited examinations for reoccurring indications may be performed as noted.  Right Carotid Findings: +----------+--------+--------+--------+---------------------+------------------+           PSV cm/sEDV cm/sStenosisPlaque Description   Comments           +----------+--------+--------+--------+---------------------+------------------+ CCA Prox  114     9       <50%    heterogenous and     turbulent                                            irregular                               +----------+--------+--------+--------+---------------------+------------------+ CCA Mid   138     15      <50%    calcific and         Shadowing                                            irregular                               +----------+--------+--------+--------+---------------------+------------------+ CCA Distal101     8       <50%    heterogenous                             +----------+--------+--------+--------+---------------------+------------------+ ICA Prox  117     14              calcific and                                                              irregular                               +----------+--------+--------+--------+---------------------+------------------+ ICA Mid   161     17  40-59%  calcific and         turbulent,                                           irregular            stenosis based on                                                         peak systolic                                                             velocities         +----------+--------+--------+--------+---------------------+------------------+ ICA Distal139     20                                   turbulent          +----------+--------+--------+--------+---------------------+------------------+ ECA       176     9               calcific             shadowing          +----------+--------+--------+--------+---------------------+------------------+ +----------+--------+-------+--------+-------------------+           PSV cm/sEDV cmsDescribeArm Pressure (mmHG) +----------+--------+-------+--------+-------------------+ Subclavian181                    177                 +----------+--------+-------+--------+-------------------+ +---------+--------+--+--------+--+ VertebralPSV cm/s69EDV cm/s16 +---------+--------+--+--------+--+  Left Carotid Findings: +----------+--------+--------+--------+---------------------+------------------+           PSV cm/sEDV cm/sStenosisPlaque Description   Comments           +----------+--------+--------+--------+---------------------+------------------+ CCA Prox  59      12      <50%    heterogenous                            +----------+--------+--------+--------+---------------------+------------------+ CCA Mid   128     21      <50%    calcific and                                                               irregular                               +----------+--------+--------+--------+---------------------+------------------+ CCA Distal239     39      >50%    calcific and         Shadowing  irregular                               +----------+--------+--------+--------+---------------------+------------------+ ICA Prox  291     43      40-59%  calcific             turbulent,                                                                shadowing          +----------+--------+--------+--------+---------------------+------------------+ ICA Mid   201     23              calcific             turbulent          +----------+--------+--------+--------+---------------------+------------------+ ICA Distal162     26              heterogenous         turbulent          +----------+--------+--------+--------+---------------------+------------------+ ECA       0       0       Occludedcalcific             shadowing,                                                                possible                                                                  occlusion,                                                                reconstitutes                                                             further distally   +----------+--------+--------+--------+---------------------+------------------+ +----------+--------+--------+----------------+-------------------+           PSV cm/sEDV cm/sDescribe        Arm Pressure (mmHG) +----------+--------+--------+----------------+-------------------+ [id number]             Multiphasic, FAO130                 +----------+--------+--------+----------------+-------------------+ +---------+--------+--------+--------------+ VertebralPSV cm/sEDV cm/sNot identified +---------+--------+--------+--------------+    Summary: Right Carotid: Velocities in the right ICA are consistent with a 40-59%  stenosis. Non-hemodynamically significant plaque <50% noted in                the CCA. RICA stenosis based on peak systolic velocities. Left Carotid: Velocities in the left ICA are consistent with a 40-59% stenosis.               Hemodynamically significant plaque >50% visualized in the CCA. The               ECA appears occluded. Vertebrals:  Right vertebral artery demonstrates antegrade flow. Left vertebral              artery was not visualized. Subclavians: Right subclavian artery flow was disturbed. Normal flow              hemodynamics were seen in the left subclavian artery. *See table(s) above for measurements and observations. Suggest follow up study in 12 months. Electronically signed by Antionette Kirks MD on 07/27/2023 at 8:31:09 AM.    Final     Assessment & Plan:   Problem List Items Addressed This Visit     Polycythemia, secondary (Chronic)   Monitor CBC.  Phlebotomy as needed      Relevant Orders   VITAMIN D  25 Hydroxy (Vit-D Deficiency, Fractures)   Vitamin D  deficiency   Continue to take vitamin D       Hypertension, renal disease, stage 1-4 or unspecified chronic kidney disease   Hydrate well Check GFR Referral to Nephrology, Dr Po was made      Relevant Orders   CBC with Differential/Platelet   Fatigue   CFS Worse a little Check labs - treat pain      Relevant Orders   T4, free   Vitamin B12   Uric acid   History of colonic polyps   Will ref for colonoscopy      Relevant Orders   Ambulatory referral to Gastroenterology   B12 deficiency   On B complex      Relevant Orders   Vitamin B12   LOW BACK PAIN   Better      Relevant Medications   traMADol  (ULTRAM ) 50 MG tablet   Other Relevant Orders   Vitamin B12   Well adult exam - Primary   We discussed age appropriate health related issues, including available/recomended screening tests and vaccinations.  Labs were ordered to be later reviewed . All questions were answered. We discussed one or more of the following - seat belt use, use of sunscreen/sun exposure exercise, safe sex, fall risk reduction, second hand smoke exposure, firearm use and storage, seat belt use, a need for adhering to healthy diet and exercise. Labs were ordered.  All questions were answered. Last colon 2019, due in 2024      Relevant Orders   TSH   Urinalysis   CBC with Differential/Platelet   Lipid panel   PSA   Comprehensive metabolic panel with GFR   Sedimentation rate   T4, free   Vitamin B12   VITAMIN D  25 Hydroxy (Vit-D Deficiency, Fractures)      Meds ordered this encounter  Medications   losartan  (COZAAR ) 100 MG tablet    Sig: Take 1 tablet (100 mg total) by mouth daily.   tadalafil , PAH, (ADCIRCA ) 20 MG tablet    Sig: Take 1 tablet (20 mg total) by mouth every 3 (three) days.    Dispense:  10 tablet    Refill:  6   traMADol  (ULTRAM ) 50 MG tablet    Sig:  Take 1 tablet (50 mg total) by mouth every 6 (six) hours as needed.    Dispense:  120 tablet    Refill:  5      Follow-up: Return in about 6 months (around 09/19/2024) for a follow-up visit.  Anitra Barn, MD

## 2024-03-20 NOTE — Assessment & Plan Note (Signed)
  We discussed age appropriate health related issues, including available/recomended screening tests and vaccinations. Labs were ordered to be later reviewed . All questions were answered. We discussed one or more of the following - seat belt use, use of sunscreen/sun exposure exercise, safe sex, fall risk reduction, second hand smoke exposure, firearm use and storage, seat belt use, a need for adhering to healthy diet and exercise. Labs were ordered.  All questions were answered. Last colon 2019, due in 2024

## 2024-03-20 NOTE — Assessment & Plan Note (Signed)
 Better

## 2024-03-20 NOTE — Assessment & Plan Note (Signed)
 Continue to take vitamin D

## 2024-03-20 NOTE — Assessment & Plan Note (Signed)
Will ref for colonoscopy 

## 2024-03-20 NOTE — Assessment & Plan Note (Signed)
 Hydrate well Check GFR Referral to Nephrology, Dr Po was made

## 2024-03-21 ENCOUNTER — Telehealth: Payer: Self-pay

## 2024-03-21 ENCOUNTER — Other Ambulatory Visit (HOSPITAL_COMMUNITY): Payer: Self-pay

## 2024-03-21 ENCOUNTER — Telehealth: Payer: Self-pay | Admitting: Internal Medicine

## 2024-03-21 ENCOUNTER — Encounter: Payer: Self-pay | Admitting: Family

## 2024-03-21 NOTE — Telephone Encounter (Signed)
 A user error has taken place: orders placed in error, not carried out on this patient.

## 2024-03-21 NOTE — Telephone Encounter (Signed)
 Pharmacy Patient Advocate Encounter   Received notification from Pt Calls Messages that prior authorization for Tramadol  50mg  is required/requested.   Insurance verification completed.   The patient is insured through [facility] .   Per test claim: PA required; PA submitted to above mentioned insurance via CoverMyMeds Key/confirmation #/EOC [id number] Status is pending

## 2024-03-21 NOTE — Telephone Encounter (Signed)
 Copied from CRM [id number]. Topic: Clinical - Prescription Issue >> Mar 21, 2024 10:26 AM Jorie Newness J wrote: Reason for CRM: Pt states his pharmacy needs a PA for traMADol  (ULTRAM ) 50 MG tablet, he states he doesn't know why he's been on this medication for 3 years and has never had to do this before, please advise   Callback # [phone]  ---  Please start PA for pt

## 2024-03-21 NOTE — Telephone Encounter (Signed)
 Pharmacy Patient Advocate Encounter  Received notification from [employer] that Prior Authorization for Tramadol  50mg  has been APPROVED from 03/21/24 to 04/20/24   PA #/Case ID/Reference #: [id number]

## 2024-03-24 ENCOUNTER — Encounter: Payer: Self-pay | Admitting: Internal Medicine

## 2024-03-26 ENCOUNTER — Ambulatory Visit: Payer: Medicare HMO

## 2024-04-11 NOTE — Progress Notes (Signed)
 Remote pacemaker transmission.

## 2024-04-11 NOTE — Addendum Note (Signed)
 Addended by: Lott Rouleau A on: 04/11/2024 09:56 AM   Modules accepted: Orders

## 2024-05-01 ENCOUNTER — Telehealth: Payer: Self-pay | Admitting: Internal Medicine

## 2024-05-01 NOTE — Telephone Encounter (Signed)
 Copied from CRM [id number]. Topic: Referral - Request for Referral >> May 01, 2024 10:50 AM Lajean Pike wrote: Did the patient discuss referral with their provider in the last year? Yes (If No - schedule appointment) (If Yes - send message)  Appointment offered? Yes  Type of order/referral and detailed reason for visit: Vein and Vascular Doctor. Patient is listed for three scans but not the neck and that's the one he is concerned about mainly. Patient was advised to contact us  to receive another referral for the neck scan.   Preference of office, provider, location:   If referral order, have you been seen by this specialty before? Yes (If Yes, this issue or another issue? When? Where?  Can we respond through MyChart? Yes

## 2024-05-07 ENCOUNTER — Telehealth: Payer: Self-pay | Admitting: Cardiovascular Disease

## 2024-05-07 DIAGNOSIS — I6523 Occlusion and stenosis of bilateral carotid arteries: Secondary | ICD-10-CM

## 2024-05-07 NOTE — Telephone Encounter (Signed)
 Douglas Christensen gets this test done through Dr. Katheryne Pane office. He needs to call Dr Katheryne Pane office.  Thanks

## 2024-05-07 NOTE — Telephone Encounter (Signed)
 Spoke with patient and he states he would like a referral to vein and vascular for his carotid US . He states they are doing his legs but will need referral for his legs.

## 2024-05-07 NOTE — Telephone Encounter (Signed)
 Attempted to contact pt to inform him of PCP's advice as follows "Douglas Christensen gets this test done through Dr. Katheryne Pane office. He needs to call Dr Katheryne Pane office.  Thanks"

## 2024-05-07 NOTE — Telephone Encounter (Signed)
 Pt requesting a referral to get his pulse checked in his neck by vein and vascular. Please advise

## 2024-05-10 NOTE — Telephone Encounter (Signed)
 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  He has moderate carotid disease otherwise and should continue with medical therapy and follow-up duplex imaging in 1 year.  Thank you   Called and spoke with patient who states he lives 8 hours away and is scheduled for three doppler studies here on 06/17/24. He is wanting to know if this can be done on same day to prevent another drive here. Advised him that I would route the order to [facility] and put it on my scheduling requests. Pt appreciative.

## 2024-05-20 ENCOUNTER — Ambulatory Visit: Payer: Medicare HMO

## 2024-05-28 ENCOUNTER — Ambulatory Visit (INDEPENDENT_AMBULATORY_CARE_PROVIDER_SITE_OTHER)

## 2024-05-28 DIAGNOSIS — I442 Atrioventricular block, complete: Secondary | ICD-10-CM | POA: Diagnosis not present

## 2024-05-28 LAB — CUP PACEART REMOTE DEVICE CHECK
Battery Remaining Longevity: 91 mo
Battery Voltage: 2.99 V
Brady Statistic AP VP Percent: 71.82 %
Brady Statistic AP VS Percent: 0 %
Brady Statistic AS VP Percent: 23.58 %
Brady Statistic AS VS Percent: 4.6 %
Brady Statistic RA Percent Paced: 73.8 %
Brady Statistic RV Percent Paced: 95.4 %
Date Time Interrogation Session: 20250630185147
Implantable Lead Connection Status: 753985
Implantable Lead Connection Status: 753985
Implantable Lead Implant Date: 20201231
Implantable Lead Implant Date: 20201231
Implantable Lead Location: 753859
Implantable Lead Location: 753860
Implantable Lead Model: 3830
Implantable Lead Model: 5076
Implantable Pulse Generator Implant Date: 20201231
Lead Channel Impedance Value: 342 Ohm
Lead Channel Impedance Value: 418 Ohm
Lead Channel Impedance Value: 494 Ohm
Lead Channel Impedance Value: 589 Ohm
Lead Channel Pacing Threshold Amplitude: 0.625 V
Lead Channel Pacing Threshold Amplitude: 0.75 V
Lead Channel Pacing Threshold Pulse Width: 0.4 ms
Lead Channel Pacing Threshold Pulse Width: 0.4 ms
Lead Channel Sensing Intrinsic Amplitude: 4.625 mV
Lead Channel Sensing Intrinsic Amplitude: 4.625 mV
Lead Channel Sensing Intrinsic Amplitude: 6 mV
Lead Channel Sensing Intrinsic Amplitude: 6 mV
Lead Channel Setting Pacing Amplitude: 1.5 V
Lead Channel Setting Pacing Amplitude: 2.5 V
Lead Channel Setting Pacing Pulse Width: 0.4 ms
Lead Channel Setting Sensing Sensitivity: 1.2 mV
Zone Setting Status: 755011
Zone Setting Status: 755011

## 2024-06-02 ENCOUNTER — Ambulatory Visit: Payer: Self-pay | Admitting: Cardiovascular Disease

## 2024-06-03 ENCOUNTER — Other Ambulatory Visit: Payer: Self-pay | Admitting: Internal Medicine

## 2024-06-03 NOTE — Telephone Encounter (Unsigned)
 Copied from CRM [id number]. Topic: Clinical - Medication Refill >> Jun 03, 2024 10:24 AM Zebedee SAUNDERS wrote: Medication: tadalafil , PAH, (ADCIRCA ) 20 MG tablet  Has the patient contacted their pharmacy? Yes (Agent: If no, request that the patient contact the pharmacy for the refill. If patient does not wish to contact the pharmacy document the reason why and proceed with request.) (Agent: If yes, when and what did the pharmacy advise?)Pharmacy need approval from PCP.   This is the patient's preferred pharmacy:  [facility] - [place], [place] - [address] [place] [address] Phone: [phone] Fax: [phone]  Is this the correct pharmacy for this prescription? Yes If no, delete pharmacy and type the correct one.   Has the prescription been filled recently? Yes  Is the patient out of the medication? Yes  Has the patient been seen for an appointment in the last year OR does the patient have an upcoming appointment? Yes  Can we respond through MyChart? Yes  Agent: Please be advised that Rx refills may take up to 3 business days. We ask that you follow-up with your pharmacy.

## 2024-06-04 ENCOUNTER — Other Ambulatory Visit: Payer: Self-pay | Admitting: Cardiovascular Disease

## 2024-06-17 ENCOUNTER — Encounter (HOSPITAL_COMMUNITY): Payer: Self-pay

## 2024-06-17 ENCOUNTER — Encounter (HOSPITAL_COMMUNITY)

## 2024-06-18 ENCOUNTER — Encounter: Payer: Self-pay | Admitting: *Deleted

## 2024-06-18 ENCOUNTER — Ambulatory Visit (INDEPENDENT_AMBULATORY_CARE_PROVIDER_SITE_OTHER)

## 2024-06-18 VITALS — Ht 64.0 in | Wt 167.0 lb

## 2024-06-18 DIAGNOSIS — Z0000 Encounter for general adult medical examination without abnormal findings: Secondary | ICD-10-CM | POA: Diagnosis not present

## 2024-06-18 DIAGNOSIS — K635 Polyp of colon: Secondary | ICD-10-CM

## 2024-06-18 DIAGNOSIS — Z87891 Personal history of nicotine dependence: Secondary | ICD-10-CM | POA: Diagnosis not present

## 2024-06-18 DIAGNOSIS — Z122 Encounter for screening for malignant neoplasm of respiratory organs: Secondary | ICD-10-CM | POA: Diagnosis not present

## 2024-06-18 NOTE — Progress Notes (Cosign Needed)
 Subjective:   Douglas Christensen is a 73 y.o. who presents for a Medicare Wellness preventive visit.  As a reminder, Annual Wellness Visits don't include a physical exam, and some assessments may be limited, especially if this visit is performed virtually. We may recommend an in-person follow-up visit with your provider if needed.  Visit Complete: Virtual I connected with  Douglas Christensen on 06/18/24 by a audio enabled telemedicine application and verified that I am speaking with the correct person using two identifiers.  Patient Location: Home  Provider Location: Office/Clinic  I discussed the limitations of evaluation and management by telemedicine. The patient expressed understanding and agreed to proceed.  Vital Signs: Because this visit was a virtual/telehealth visit, some criteria may be missing or patient reported. Any vitals not documented were not able to be obtained and vitals that have been documented are patient reported.  VideoDeclined- This patient declined Librarian, academic. Therefore the visit was completed with audio only.  Persons Participating in Visit: Patient.  AWV Questionnaire: No: Patient Medicare AWV questionnaire was not completed prior to this visit.  Cardiac Risk Factors include: advanced age (>52men, >71 women);hypertension;male gender     Objective:    Today's Vitals   06/18/24 1133  Weight: 167 lb (75.8 kg)  Height: 5' 4 (1.626 m)   Body mass index is 28.67 kg/m.     06/18/2024   11:33 AM 05/11/2023    3:38 PM 07/26/2022    9:49 AM 05/10/2022    4:39 PM 11/02/2021    6:17 AM 09/26/2021    1:52 PM 06/25/2021    1:52 PM  Advanced Directives  Does Patient Have a Medical Advance Directive? No No No No No No No  Would patient like information on creating a medical advance directive? Yes (MAU/Ambulatory/Procedural Areas - Information given) No - Patient declined No - Patient declined No - Patient declined Yes  (MAU/Ambulatory/Procedural Areas - Information given) No - Patient declined No - Patient declined    Current Medications (verified) Outpatient Encounter Medications as of 06/18/2024  Medication Sig   acetaminophen  (TYLENOL ) 650 MG CR tablet Take 1,300 mg by mouth every 8 (eight) hours as needed for pain.   Ascorbic Acid (VITAMIN C PO) Take 1 tablet by mouth in the morning.   aspirin  EC 81 MG tablet Take 81 mg by mouth every other day. Swallow whole.   cholecalciferol  (VITAMIN D ) 1000 units tablet Take 1,000 Units by mouth daily.    clonazePAM  (KLONOPIN ) 0.5 MG tablet TAKE ONE TABLET BY MOUTH TWICE DAILY AS NEEDED FOR ANXIETY   furosemide  (LASIX ) 40 MG tablet TAKE 0.5-1 TABLETS (20-40 MG TOTAL) BY MOUTH DAILY AS NEEDED FOR EDEMA.   losartan  (COZAAR ) 100 MG tablet Take 1 tablet (100 mg total) by mouth daily.   metoprolol  succinate (TOPROL -XL) 50 MG 24 hr tablet Take 1.5 tablets (75 mg total) by mouth 2 (two) times daily. Take with or immediately following a meal.   nitroGLYCERIN  (NITROSTAT ) 0.4 MG SL tablet Place 1 tablet (0.4 mg total) under the tongue every 5 (five) minutes as needed for chest pain.   rosuvastatin  (CRESTOR ) 40 MG tablet Take 1 tablet (40 mg total) by mouth 3 (three) times a week..   tadalafil , PAH, (ADCIRCA ) 20 MG tablet Take 1 tablet (20 mg total) by mouth every 3 (three) days.   traMADol  (ULTRAM ) 50 MG tablet Take 1 tablet (50 mg total) by mouth every 6 (six) hours as needed.   vitamin B-12 (  CYANOCOBALAMIN ) 1000 MCG tablet Take 1,000 mcg by mouth daily.   cyclobenzaprine  (FLEXERIL ) 10 MG tablet Take 1 tablet (10 mg total) by mouth 3 (three) times daily as needed for muscle spasms. (Patient not taking: Reported on 06/18/2024)   metroNIDAZOLE  (METROCREAM ) 0.75 % cream Apply topically 2 (two) times daily. (Patient not taking: Reported on 06/18/2024)   No facility-administered encounter medications on file as of 06/18/2024.    Allergies (verified) Roxicodone  [oxycodone  hcl],  Atorvastatin , Zetia  [ezetimibe ], Benazepril, Gabapentin , Hydralazine , Hydrocodone , Itraconazole , Lipitor [atorvastatin  calcium ], and Oxycodone    History: Past Medical History:  Diagnosis Date   Anxiety    Arthritis    CAD (coronary artery disease)    Mild plaque (cath years ago); abnormal Myoview  04/2013 with subsequent CABG x 5 with LIMA to LAD, SVG to OM1, SVG to DX, SVG to PD & PL. // Myoview  10/23: EF 62, no ischemia or infarction; low risk   Carotid artery disease (HCC)    Carotid US  07/2019 bilateral ICA 40-59; bilateral subclavian stenosis // Carotid US  9/21: Bilateral ICA 40-59; L VA occluded; R subclavian stenosis   Cataract    GERD (gastroesophageal reflux disease)    History of colonic polyps    History of echocardiogram    Echo 2/19: Mild LVH, EF 60-65, normal wall motion, grade 2 diastolic dysfunction, mild RAE   History of nuclear stress test    Myoview  2/19: inf/inf-lat/apical inf/ant-sept defect (?diaph atten - cannot rule out peri-infarct ischemia), PVCs/PACs/Mobitz 1 // Myoview  05/2019: EF 60, inf infarct with mild peri-infarct ischemia; no significant change when compared to prior study; Intermediate Risk   Hx of echocardiogram    Echo (9/15):  EF 55-60%; Gr 2 DD, mild BAE   Hyperlipidemia    Hypertension    LBP (low back pain)    Meralgia paresthetica of left side 2011   Polycythemia, 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  Echo 10/23: EF 55-60, no RWMA, mild LVH, normal RVSF, mild RAE, mild MR, AV sclerosis without stenosis, RAP 3    PVD (peripheral vascular disease) (HCC)    Stent to left common femoral and right superficial femoral.  2008.  50%  left renal    Second degree AV block, Mobitz type I    Holter 2/19: Sinus rhythm, average heart rate 72, frequent PVCs (burden 5%), second-degree type I AV  block and periods of 2:1 heart block >> continue clinical managment and avoid AVN blocking agents   Shortness of breath    once in awhile; can happen at anytime (08/26/2013)   Sleep apnea    mod OSA, central sleep apnea/hypoapnea syndrome 11/22/12, CPAP every night    Subclavian artery stenosis (HCC)    Carotid US  07/2019 bilateral ICA 40-59; bilateral subclavian stenosis   Vitamin D  deficiency    Past Surgical History:  Procedure Laterality Date   ABDOMINAL AORTAGRAM N/A 11/16/2011   Procedure: ABDOMINAL EZELLA;  Surgeon: Ozell Fell, MD;  Location: [facility] CATH LAB;  Service: Cardiovascular;  Laterality: N/A;   ABDOMINAL AORTAGRAM N/A 11/14/2012   Procedure: ABDOMINAL EZELLA;  Surgeon: Ozell Fell, MD;  Location: [facility] CATH LAB;  Service: Cardiovascular;  Laterality: N/A;   ABDOMINAL AORTAGRAM N/A 12/30/2014   Procedure: ABDOMINAL EZELLA;  Surgeon: Gaile LELON New, MD;  Location[facility] CATH LAB;  Service: Cardiovascular;  Laterality: N/A;   ABDOMINAL AORTOGRAM W/LOWER EXTREMITY  Bilateral 08/06/2019   Procedure: ABDOMINAL AORTOGRAM W/LOWER EXTREMITY;  Surgeon: Serene Gaile ORN, MD;  Location: [facility] INVASIVE CV LAB;  Service: Cardiovascular;  Laterality: Bilateral;   ABDOMINAL AORTOGRAM W/LOWER EXTREMITY Bilateral 11/02/2021   Procedure: ABDOMINAL AORTOGRAM W/LOWER EXTREMITY;  Surgeon: Serene Gaile ORN, MD;  Location: [facility] INVASIVE CV LAB;  Service: Cardiovascular;  Laterality: Bilateral;   CARDIAC CATHETERIZATION  05/02/13   x2    CORONARY ARTERY BYPASS GRAFT N/A 05/06/2013   Procedure: CORONARY ARTERY BYPASS GRAFTING (CABG);  Surgeon: Elspeth JAYSON Millers, MD;  Location: [facility] OR;  Service: Open Heart Surgery;  Laterality: N/A;  Coronary artery bypass graft times five using left internal mammary artery and left greater saphenous vein via endovein harvest.   ENDARTERECTOMY FEMORAL Left 01/29/2015   Procedure: ENDARTERECTOMY FEMORAL WITH PATCH ANGIOPLASTY;  Surgeon: Gaile ORN Serene, MD;  Location: [facility] OR;   Service: Vascular;  Laterality: Left;   EYE SURGERY  03/24/16   cataract surgery on left eye   FEMORAL-POPLITEAL BYPASS GRAFT  12/27/2012   Procedure: BYPASS GRAFT FEMORAL-POPLITEAL ARTERY;  Surgeon: Gaile ORN Serene, MD;  Location: [facility] OR;  Service: Vascular;  Laterality: Right;  using non-reversed sapphenous vein.   ILIAC ATHERECTOMY Left 01/29/2015   Procedure: SUPERFICIAL FEMORAL ARTERY ATHERECTOMY/PERCUTANEOUS TRANSLUMINAL ANGIOPLASTY; superficial femoral artery stent;  Surgeon: Gaile ORN Serene, MD;  Location: [facility] OR;  Service: Vascular;  Laterality: Left;   LOWER EXTREMITY ANGIOGRAM Bilateral 11/16/2011   Procedure: LOWER EXTREMITY ANGIOGRAM;  Surgeon: Ozell Fell, MD;  Location: [facility] CATH LAB;  Service: Cardiovascular;  Laterality: Bilateral;   lower extremity stents     bilateral lower extremities x 2   PACEMAKER IMPLANT N/A 11/28/2019   Procedure: PACEMAKER IMPLANT;  Surgeon: Kelsie Agent, MD;  Location: [facility] INVASIVE CV LAB;  Service: Cardiovascular;  Laterality: N/A;   PERCUTANEOUS STENT INTERVENTION Left 11/16/2011   Procedure: PERCUTANEOUS STENT INTERVENTION;  Surgeon: Ozell Fell, MD;  Location: [facility] CATH LAB;  Service: Cardiovascular;  Laterality: Left;   PERIPHERAL VASCULAR ATHERECTOMY Right 08/06/2019   Procedure: PERIPHERAL VASCULAR ATHERECTOMY;  Surgeon: Serene Gaile ORN, MD;  Location: [facility] INVASIVE CV LAB;  Service: Cardiovascular;  Laterality: Right;  Common iliac, external iliac, and popliteal.   PERIPHERAL VASCULAR INTERVENTION Right 08/06/2019   Procedure: PERIPHERAL VASCULAR INTERVENTION;  Surgeon: Serene Gaile ORN, MD;  Location: [facility] INVASIVE CV LAB;  Service: Cardiovascular;  Laterality: Right;  common iliac, external iliac, and popliteal.   TONSILLECTOMY     TOTAL HIP ARTHROPLASTY Left 06/29/2015   Procedure: TOTAL HIP ARTHROPLASTY;  Surgeon: Dempsey Sensor, MD;  Location: [facility] OR;  Service: Orthopedics;  Laterality: Left;  LEFT TOTAL HIP ARTHROPLASTY DEPUY SROM/PINNACLE   Family  History  Problem Relation Age of Onset   Heart disease Mother        No clear CAD and Heart Disease before age 85   Hypertension Mother    Cancer Mother        ? colon ca   Hyperlipidemia Mother    Cancer Father        brain tumor   Alzheimer's disease Father    Colon cancer Neg Hx    Heart attack Neg Hx    Esophageal cancer Neg Hx    Liver cancer Neg Hx    Rectal cancer Neg Hx    Stomach cancer Neg Hx    Pancreatic cancer Neg Hx    Social History   Socioeconomic History   Marital status: Widowed    Spouse name: Not on file  Number of children: 2   Years of education: Not on file   Highest education level: 12th grade  Occupational History   Occupation: Herbalist: [employer]  Tobacco Use   Smoking status: Former    Current packs/day: 0.00    Average packs/day: 2.0 packs/day for 47.0 years (94.0 ttl pk-yrs)    Types: Cigarettes    Start date: 12/17/1965    Quit date: 12/17/2012    Years since quitting: 11.5   Smokeless tobacco: Never  Vaping Use   Vaping status: Never Used  Substance and Sexual Activity   Alcohol use: Yes    Alcohol/week: 4.0 standard drinks of alcohol    Types: 4 Shots of liquor per week    Comment: 08/26/2013 3-4 mixed drinks/wk   Drug use: No   Sexual activity: Yes  Other Topics Concern   Not on file  Social History Narrative   Widow   Social Drivers of Health   Financial Resource Strain: Low Risk  (06/18/2024)   Overall Financial Resource Strain (CARDIA)    Difficulty of Paying Living Expenses: Not hard at all  Food Insecurity: No Food Insecurity (06/18/2024)   Hunger Vital Sign    Worried About Running Out of Food in the Last Year: Never true    Ran Out of Food in the Last Year: Never true  Transportation Needs: No Transportation Needs (06/18/2024)   PRAPARE - Administrator, Civil Service (Medical): No    Lack of Transportation (Non-Medical): No  Physical Activity: Inactive (06/18/2024)   Exercise Vital  Sign    Days of Exercise per Week: 0 days    Minutes of Exercise per Session: 0 min  Stress: Stress Concern Present (06/18/2024)   [employer] of Occupational Health - Occupational Stress Questionnaire    Feeling of Stress: To some extent  Social Connections: Moderately Isolated (06/18/2024)   Social Connection and Isolation Panel    Frequency of Communication with Friends and Family: Once a week    Frequency of Social Gatherings with Friends and Family: Twice a week    Attends Religious Services: 1 to 4 times per year    Active Member of [employer] or Organizations: No    Attends Banker Meetings: Never    Marital Status: Widowed    Tobacco Counseling Counseling given: No    Clinical Intake:  Pre-visit preparation completed: Yes  Pain : No/denies pain     BMI - recorded: 28.67 Nutritional Status: BMI 25 -29 Overweight Nutritional Risks: None Diabetes: No  Lab Results  Component Value Date   HGBA1C 6.4 03/14/2023   HGBA1C 5.7 03/06/2017   HGBA1C 5.8 (H) 05/03/2013     How often do you need to have someone help you when you read instructions, pamphlets, or other written materials from your doctor or pharmacy?: 1 - Never  Interpreter Needed?: No  Information entered by :: Verdie Saba, CMA   Activities of Daily Living     06/18/2024   11:36 AM  In your present state of health, do you have any difficulty performing the following activities:  Hearing? 0  Comment wears hearing aids  Vision? 0  Difficulty concentrating or making decisions? 0  Walking or climbing stairs? 0  Dressing or bathing? 0  Doing errands, shopping? 0  Preparing Food and eating ? N  Using the Toilet? N  In the past six months, have you accidently leaked urine? N  Do you have problems with loss  of bowel control? N  Managing your Medications? N  Managing your Finances? N  Housekeeping or managing your Housekeeping? N    Patient Care Team: Plotnikov, Karlynn GAILS, MD as PCP -  General (Internal Medicine) Wonda Sharper, MD as PCP - Cardiology (Cardiology) Kelsie Agent, MD (Inactive) as PCP - Electrophysiology (Cardiology) Liam Lerner, MD as Consulting Physician (Orthopedic Surgery) Shellia Oh, MD (Inactive) as Consulting Physician (Pulmonary Disease) Aneita Gwendlyn DASEN, MD (Inactive) as Consulting Physician (Gastroenterology) Timmy Maude SAUNDERS, MD as Consulting Physician (Oncology)  I have updated your Care Teams any recent Medical Services you may have received from other providers in the past year.     Assessment:   This is a routine wellness examination for Douglas Christensen.  Hearing/Vision screen Hearing Screening - Comments:: Wears hearing aids Vision Screening - Comments:: Wears rx glasses - up to date with routine eye exams with Dr Moishe near [place], [place]   Goals Addressed               This Visit's Progress     Patient Stated (pt-stated)        Patient stated he plans to exercise more but neuropathy is challenging       Depression Screen     06/18/2024   11:38 AM 05/11/2023    3:53 PM 04/18/2023   10:22 AM 03/14/2023   10:30 AM 07/25/2022    1:46 PM 05/10/2022    4:54 PM 07/06/2021    9:20 AM  PHQ 2/9 Scores  PHQ - 2 Score 0 0 3 3 0 0 0  PHQ- 9 Score 0 6 9 9 6       Fall Risk     06/18/2024   11:37 AM 12/26/2023    8:48 AM 05/11/2023    3:39 PM 04/18/2023   10:22 AM 03/14/2023   10:30 AM  Fall Risk   Falls in the past year? 0 0 0 0 0  Number falls in past yr: 0 0 0 0 0  Injury with Fall? 0 0 0 0 0  Risk for fall due to : No Fall Risks  No Fall Risks No Fall Risks No Fall Risks  Follow up Falls evaluation completed;Falls prevention discussed  Falls prevention discussed Falls evaluation completed Falls evaluation completed    MEDICARE RISK AT HOME:  Medicare Risk at Home Any stairs in or around the home?: Yes If so, are there any without handrails?: No Home free of loose throw rugs in walkways, pet beds, electrical cords, etc?:  Yes Adequate lighting in your home to reduce risk of falls?: Yes Life alert?: No Use of a cane, walker or w/c?: No Grab bars in the bathroom?: Yes Shower chair or bench in shower?: No Elevated toilet seat or a handicapped toilet?: No  TIMED UP AND GO:  Was the test performed?  No  Cognitive Function: 6CIT completed    07/13/2018    3:39 PM  MMSE - Mini Mental State Exam  Orientation to time 5  Orientation to Place 5  Registration 3  Attention/ Calculation 5  Recall 3  Language- name 2 objects 2  Language- repeat 1  Language- follow 3 step command 3  Language- read & follow direction 1  Write a sentence 1  Copy design 1  Total score 30        06/18/2024   11:51 AM 05/11/2023    3:42 PM 05/10/2022    4:56 PM  6CIT Screen  What Year? 0 points 0  points 0 points  What month? 0 points 0 points 0 points  What time? 0 points 0 points 0 points  Count back from 20 0 points 0 points 0 points  Months in reverse 0 points 0 points 0 points  Repeat phrase 0 points 0 points 0 points  Total Score 0 points 0 points 0 points    Immunizations Immunization History  Administered Date(s) Administered   Influenza Inj Mdck Quad With Preservative 10/31/2018, 07/16/2019   Influenza Split 12/28/2012   Influenza Whole 08/18/2010   Influenza, High Dose Seasonal PF 09/05/2016, 09/06/2017, 10/14/2021   Influenza,inj,Quad PF,6+ Mos 07/22/2015   Influenza-Unspecified 01/16/2014   PFIZER(Purple Top)SARS-COV-2 Vaccination 01/02/2020, 01/23/2020, 08/23/2020   Pneumococcal Conjugate-13 01/27/2014   Pneumococcal Polysaccharide-23 08/28/2006   Tdap 03/01/2017    Screening Tests Health Maintenance  Topic Date Due   Pneumococcal Vaccine: 50+ Years (3 of 3 - PCV20 or PCV21) 03/24/2014   Colonoscopy  03/21/2021   Lung Cancer Screening  09/26/2022   COVID-19 Vaccine (4 - 2024-25 season) 07/30/2023   INFLUENZA VACCINE  06/28/2024   Medicare Annual Wellness (AWV)  06/18/2025   DTaP/Tdap/Td (2 - Td  or Tdap) 03/02/2027   Hepatitis C Screening  Completed   Hepatitis B Vaccines  Aged Out   HPV VACCINES  Aged Out   Meningococcal B Vaccine  Aged Out   Zoster Vaccines- Shingrix  Discontinued    Health Maintenance  Health Maintenance Due  Topic Date Due   Pneumococcal Vaccine: 50+ Years (3 of 3 - PCV20 or PCV21) 03/24/2014   Colonoscopy  03/21/2021   Lung Cancer Screening  09/26/2022   COVID-19 Vaccine (4 - 2024-25 season) 07/30/2023   Health Maintenance Items Addressed: Referral sent to GI for colonoscopy, Referral sent to [facility] (smoker/hx smoking)  Additional Screening:  Vision Screening: Recommended annual ophthalmology exams for early detection of glaucoma and other disorders of the eye. Would you like a referral to an eye doctor? No  Patient now has an Sport and exercise psychologist near [place], [place], Dr Moishe.  He has had an eye exam in early 2025.  Dental Screening: Recommended annual dental exams for proper oral hygiene  Community Resource Referral / Chronic Care Management: CRR required this visit?  No   CCM required this visit?  No   Plan:    I have personally reviewed and noted the following in the patient's chart:   Medical and social history Use of alcohol, tobacco or illicit drugs  Current medications and supplements including opioid prescriptions. Patient is currently taking opioid prescriptions. Information provided to patient regarding non-opioid alternatives. Patient advised to discuss non-opioid treatment plan with their provider. Functional ability and status Nutritional status Physical activity Advanced directives List of other physicians Hospitalizations, surgeries, and ER visits in previous 12 months Vitals Screenings to include cognitive, depression, and falls Referrals and appointments  In addition, I have reviewed and discussed with patient certain preventive protocols, quality metrics, and best practice recommendations. A written  personalized care plan for preventive services as well as general preventive health recommendations were provided to patient.   Verdie CHRISTELLA Saba, CMA   06/18/2024   After Visit Summary: (MyChart) Due to this being a telephonic visit, the after visit summary with patients personalized plan was offered to patient via MyChart   Notes: Nothing significant to report at this time.  Medical screening examination/treatment/procedure(s) were performed by non-physician practitioner and as supervising physician I was immediately available for consultation/collaboration.  I agree with above. Karlynn Noel, MD

## 2024-06-18 NOTE — Patient Instructions (Addendum)
 Douglas Christensen , Thank you for taking time out of your busy schedule to complete your Annual Wellness Visit with me. I enjoyed our conversation and look forward to speaking with you again next year. I, as well as your care team,  appreciate your ongoing commitment to your health goals. Please review the following plan we discussed and let me know if I can assist you in the future. Your Game plan/ To Do List    Referrals: If you haven't heard from the office you've been referred to, please reach out to them at the phone provided.  Referral to [facility] for a repeat Colonoscopy; Referral to [facility] for a Lung Cancer Screening Follow up Visits: Next Medicare AWV with our clinical staff: 06/26/2025 - in person at office   Have you seen your provider in the last 6 months (3 months if uncontrolled diabetes)? Yes Next Office Visit with your provider: 03/25/2025  Clinician Recommendations:  Aim for 30 minutes of exercise or brisk walking, 6-8 glasses of water , and 5 servings of fruits and vegetables each day. Educated and advised on getting the Pneumonia vaccine at local pharmacy.      This is a list of the screening recommended for you and due dates:  Health Maintenance  Topic Date Due   Pneumococcal Vaccine for age over 62 (3 of 3 - PCV20 or PCV21) 03/24/2014   Colon Cancer Screening  03/21/2021   Screening for Lung Cancer  09/26/2022   COVID-19 Vaccine (4 - 2024-25 season) 07/30/2023   Flu Shot  06/28/2024   Medicare Annual Wellness Visit  06/18/2025   DTaP/Tdap/Td vaccine (2 - Td or Tdap) 03/02/2027   Hepatitis C Screening  Completed   Hepatitis B Vaccine  Aged Out   HPV Vaccine  Aged Out   Meningitis B Vaccine  Aged Out   Zoster (Shingles) Vaccine  Discontinued    Advanced directives: (Declined) Advance directive discussed with you today. Even though you declined this today, please call our office should you change your mind, and we can give you the proper paperwork for you to fill  out. Advance Care Planning is important because it:  [x]  Makes sure you receive the medical care that is consistent with your values, goals, and preferences  [x]  It provides guidance to your family and loved ones and reduces their decisional burden about whether or not they are making the right decisions based on your wishes.  Follow the link provided in your after visit summary or read over the paperwork we have mailed to you to help you started getting your Advance Directives in place. If you need assistance in completing these, please reach out to us  so that we can help you!

## 2024-06-24 ENCOUNTER — Ambulatory Visit

## 2024-06-25 ENCOUNTER — Ambulatory Visit: Payer: Medicare HMO

## 2024-06-27 ENCOUNTER — Other Ambulatory Visit: Payer: Self-pay | Admitting: *Deleted

## 2024-06-27 DIAGNOSIS — I779 Disorder of arteries and arterioles, unspecified: Secondary | ICD-10-CM

## 2024-06-27 DIAGNOSIS — I739 Peripheral vascular disease, unspecified: Secondary | ICD-10-CM

## 2024-07-01 ENCOUNTER — Other Ambulatory Visit: Payer: Self-pay | Admitting: Cardiovascular Disease

## 2024-07-01 ENCOUNTER — Other Ambulatory Visit: Payer: Self-pay | Admitting: Internal Medicine

## 2024-07-02 ENCOUNTER — Other Ambulatory Visit: Payer: Self-pay | Admitting: Internal Medicine

## 2024-07-08 ENCOUNTER — Ambulatory Visit

## 2024-07-22 ENCOUNTER — Telehealth: Payer: Self-pay

## 2024-07-22 ENCOUNTER — Ambulatory Visit (HOSPITAL_BASED_OUTPATIENT_CLINIC_OR_DEPARTMENT_OTHER)
Admission: RE | Admit: 2024-07-22 | Discharge: 2024-07-22 | Disposition: A | Discharge status: Home / Self Care | Payer: Self-pay | Source: Ambulatory Visit | Attending: Surgery | Admitting: Surgery

## 2024-07-22 ENCOUNTER — Ambulatory Visit (HOSPITAL_BASED_OUTPATIENT_CLINIC_OR_DEPARTMENT_OTHER)
Admission: RE | Admit: 2024-07-22 | Discharge: 2024-07-22 | Disposition: A | Discharge status: Home / Self Care | Source: Ambulatory Visit | Attending: Surgery | Admitting: Surgery

## 2024-07-22 ENCOUNTER — Ambulatory Visit (HOSPITAL_COMMUNITY)
Admission: RE | Admit: 2024-07-22 | Discharge: 2024-07-22 | Disposition: A | Discharge status: Home / Self Care | Payer: Self-pay | Source: Ambulatory Visit | Attending: Surgery | Admitting: Surgery

## 2024-07-22 ENCOUNTER — Ambulatory Visit: Attending: Surgery | Admitting: Physician Assistant

## 2024-07-22 ENCOUNTER — Ambulatory Visit: Payer: Self-pay | Admitting: Cardiovascular Disease

## 2024-07-22 VITALS — BP 185/86 | HR 60 | Temp 98.0°F | Wt 167.7 lb

## 2024-07-22 DIAGNOSIS — I771 Stricture of artery: Secondary | ICD-10-CM

## 2024-07-22 DIAGNOSIS — I739 Peripheral vascular disease, unspecified: Secondary | ICD-10-CM

## 2024-07-22 DIAGNOSIS — I6523 Occlusion and stenosis of bilateral carotid arteries: Secondary | ICD-10-CM | POA: Diagnosis not present

## 2024-07-22 DIAGNOSIS — I779 Disorder of arteries and arterioles, unspecified: Secondary | ICD-10-CM | POA: Diagnosis present

## 2024-07-22 DIAGNOSIS — T82856D Stenosis of peripheral vascular stent, subsequent encounter: Secondary | ICD-10-CM | POA: Diagnosis not present

## 2024-07-22 LAB — VAS US ABI WITH/WO TBI
Left ABI: 0.79
Right ABI: 1.22

## 2024-07-22 NOTE — Progress Notes (Signed)
 Office Note   History of Present Illness   Douglas Christensen is a 73 y.o. ([birth year]) male who presents for surveillance of PAD. Most recently he underwent atherectomy and stent placement of the right common iliac artery, external iliac artery, and popliteal artery on 08/06/2019 by Dr. Serene due to disabling claudication. He has history of left femoral endarterectomy with vein patch angioplasty, atherectomy and stenting of left iliac and SFA on 01/29/2015 by Dr. Serene. Also has prior right common femoral endarterectomy and femoral to above-the-knee popliteal artery bypass with vein on 12/10/2012 by Dr. Serene.   At the patient's last office visit he had no claudication, rest pain, or tissue loss.  He did have pain in both of his hips with walking, likely due to arthritis.  He did have elevated velocities within his external iliac arteries, however we elected not to intervene given that he remained asymptomatic and had stable noninvasive studies.  At today's visit the patient says that he is doing okay.  About 2 to 3 months ago he started having severe cramping in both of his thighs when walking.  He says that he can walk about 30 yards before his thighs cramp and he has to stop and rest.  This has been very limiting for his mobility.  This has been stable over the past couple of months.  He denies any back pain or hip pain.  He denies any claudication in his calves.  He also denies any rest pain or tissue loss.  Current Outpatient Medications  Medication Sig Dispense Refill   acetaminophen  (TYLENOL ) 650 MG CR tablet Take 1,300 mg by mouth every 8 (eight) hours as needed for pain.     Ascorbic Acid (VITAMIN C PO) Take 1 tablet by mouth in the morning.     aspirin  EC 81 MG tablet Take 81 mg by mouth every other day. Swallow whole.     cholecalciferol  (VITAMIN D ) 1000 units tablet Take 1,000 Units by mouth daily.      clonazePAM  (KLONOPIN ) 0.5 MG tablet TAKE ONE TABLET BY MOUTH TWICE DAILY AS NEEDED FOR  ANXIETY 60 tablet 3   cyclobenzaprine  (FLEXERIL ) 10 MG tablet Take 1 tablet (10 mg total) by mouth 3 (three) times daily as needed for muscle spasms. (Patient not taking: Reported on 06/18/2024) 60 tablet 1   furosemide  (LASIX ) 40 MG tablet TAKE 0.5-1 TABLETS (20-40 MG TOTAL) BY MOUTH DAILY AS NEEDED FOR EDEMA. 90 tablet 2   losartan  (COZAAR ) 100 MG tablet Take 1 tablet (100 mg total) by mouth daily.     metoprolol  succinate (TOPROL -XL) 50 MG 24 hr tablet Take 1.5 tablets (75 mg total) by mouth 2 (two) times daily. Take with or immediately following a meal. 270 tablet 0   metroNIDAZOLE  (METROCREAM ) 0.75 % cream Apply topically 2 (two) times daily. (Patient not taking: Reported on 06/18/2024) 45 g 3   nitroGLYCERIN  (NITROSTAT ) 0.4 MG SL tablet Place 1 tablet (0.4 mg total) under the tongue every 5 (five) minutes as needed for chest pain. 25 tablet 11   rosuvastatin  (CRESTOR ) 40 MG tablet Take 1 tablet (40 mg total) by mouth 3 (three) times a week.. 12 tablet 3   tadalafil , PAH, (ADCIRCA ) 20 MG tablet TAKE ONE TABLET (20MG  TOTAL) BY MOUTH EVERY THREE DAYS 10 tablet 6   traMADol  (ULTRAM ) 50 MG tablet Take 1 tablet (50 mg total) by mouth every 6 (six) hours as needed. 120 tablet 5   vitamin B-12 (CYANOCOBALAMIN ) 1000 MCG tablet  Take 1,000 mcg by mouth daily.     No current facility-administered medications for this visit.    REVIEW OF SYSTEMS (negative unless checked):   Cardiac:  []  Chest pain or chest pressure? []  Shortness of breath upon activity? []  Shortness of breath when lying flat? []  Irregular heart rhythm?  Vascular:  [x]  Pain in calf, thigh, or hip brought on by walking? []  Pain in feet at night that wakes you up from your sleep? []  Blood clot in your veins? []  Leg swelling?  Pulmonary:  []  Oxygen at home? []  Productive cough? []  Wheezing?  Neurologic:  []  Sudden weakness in arms or legs? []  Sudden numbness in arms or legs? []  Sudden onset of difficult speaking or slurred  speech? []  Temporary loss of vision in one eye? []  Problems with dizziness?  Gastrointestinal:  []  Blood in stool? []  Vomited blood?  Genitourinary:  []  Burning when urinating? []  Blood in urine?  Psychiatric:  []  Major depression  Hematologic:  []  Bleeding problems? []  Problems with blood clotting?  Dermatologic:  []  Rashes or ulcers?  Constitutional:  []  Fever or chills?  Ear/Nose/Throat:  []  Change in hearing? []  Nose bleeds? []  Sore throat?  Musculoskeletal:  []  Back pain? []  Joint pain? []  Muscle pain?   Physical Examination   Vitals:   07/22/24 0955  BP: (!) 185/86  Pulse: 60  Temp: 98 F (36.7 C)  TempSrc: Temporal  Weight: 167 lb 11.2 oz (76.1 kg)   Body mass index is 28.79 kg/m.  General:  WDWN in NAD; vital signs documented above Gait: Not observed HENT: WNL, normocephalic Pulmonary: normal non-labored breathing , without rales, rhonchi,  wheezing Cardiac: Regular Abdomen: soft, NT, no masses Skin: without rashes Vascular Exam/Pulses: 1+ femoral pulses bilaterally.  Palpable popliteal and DP pulses bilaterally Extremities: without ischemic changes, without gangrene , without cellulitis; without open wounds;  Musculoskeletal: no muscle wasting or atrophy  Neurologic: A&O X 3;  No focal weakness or paresthesias are detected Psychiatric:  The pt has Normal affect.  Non-Invasive Vascular imaging   ABI (07/22/2024) R:  ABI: 1.22 (1.14),  PT: tri DP: tri TBI:  0.9 L:  ABI: 0.79 ([place]),  PT: bi DP: bi TBI: 0.67  Aortoiliac Duplex (07/22/2024) 50 to 99% stenosis of the mid left external iliac artery with 597 cm/s.  50 to 99% stenosis of distal right external iliac artery stent with 303 cm/s  BLE Arterial Duplex (07/22/2024) Patent right femoral to above-knee popliteal artery bypass graft without stenosis.  Patent left SFA stent without stenosis  Carotid Duplex (07/22/2024) 40-59% stenosis of bilateral internal carotid arteries  Medical  Decision Making   Douglas Christensen is a 73 y.o. male who presents for surveillance of PAD  Based on the patient's vascular studies, his ABIs are essentially unchanged from his last office visit.  His right ABI is 1.22 and left ABI is 0.79 Bilateral lower extremity arterial duplex demonstrates a patent left SFA stent without stenosis.  He also has a patent right lower extremity bypass graft without stenosis or sluggish velocities. Aortoiliac duplex demonstrates unchanged high-grade stenosis of the mid left external iliac artery with velocities of 597 cm/s.  There is also unchanged high-grade stenosis of the distal right external iliac artery stent with velocities of 303 cm/s Carotid duplex remains unchanged with 40 to 59% ICA stenosis bilaterally He denies any strokelike symptoms such as slurred speech, facial droop, sudden visual changes, or sudden weakness/numbness of bilateral upper/lower extremities Although the patient's noninvasive  studies demonstrate no significant change, the patient reports return of lifestyle limiting claudication in bilateral lower extremities.  These symptoms returned about 2 to 3 months ago.  He says he can maybe walk about 30 yards before his thighs cramp badly.  He has to stop several times when walking due to the pain.  He denies any rest pain or tissue loss. On exam he has 1+ femoral pulses bilaterally. He also has palpable popliteal and DP pulses bilaterally.  He has no tissue loss on exam. I have explained to the patient that his noninvasive studies appeared unchanged, however he would benefit from repeat abdominal aortogram for high grade iliac stenosis with return of lifestyle limiting claudication.  Angiogram may include balloon angioplasty/stenting of bilateral iliac arteries.  We will attempt to schedule this in the next couple of weeks with Dr. Serene.   Ahmed Holster PA-C [facility] Office: [phone]  Clinic MD: Serene

## 2024-07-22 NOTE — Telephone Encounter (Signed)
 Patient requested telephone call to schedule abd aortogram with Dr. Serene.

## 2024-07-23 ENCOUNTER — Telehealth: Payer: Self-pay

## 2024-07-23 NOTE — Telephone Encounter (Signed)
 Attempted to call for surgery scheduling. LVM

## 2024-07-24 NOTE — Telephone Encounter (Signed)
 Appt has been scheduled for 09/08

## 2024-07-24 NOTE — Telephone Encounter (Signed)
 From: Gretel Palma [email or web address]> Sent: Tuesday, July 23, 2024 1:53:32 PM To: Brabham, Wells [email or web address]> Subject: MRN [id number]   Dr. Serene - I had a phone conversation with your patient, Douglas Christensen, today.  He saw [facility] yesterday and she's recommending an a/o with bilateral iliac intervention.  He stated that he was recommended for procedure before but upon his procedure date, it was found to be unnecessary.   He stated that he wants your review prior to scheduling another procedure.    Would you be willing to review his case and potentially have a telephone visit with him?  Thanks! ---- From: Douglas [email or web address] [email or web address]> Sent: Tuesday, July 23, 2024 9:47 PM To: Gretel Palma [email or web address]> Subject: Re: MRN [id number]   Sure, can you add him to my schedule for a phone visit. Any day is fine  --- Al - please see below.  Can y'all schedule this patient for a phone visit with Dr. Serene? Thanks!  Palma Gretel RN, BSN  [facility]  Direct Dial: [phone]  Website: [email or web address]

## 2024-08-05 ENCOUNTER — Other Ambulatory Visit: Payer: Self-pay

## 2024-08-05 ENCOUNTER — Encounter: Payer: Self-pay | Admitting: Surgery

## 2024-08-05 ENCOUNTER — Ambulatory Visit: Attending: Surgery | Admitting: Surgery

## 2024-08-05 DIAGNOSIS — I70213 Atherosclerosis of native arteries of extremities with intermittent claudication, bilateral legs: Secondary | ICD-10-CM

## 2024-08-05 NOTE — Progress Notes (Signed)
 [facility]  Patient name: Douglas Christensen MRN: [id number] DOB: 10-25-1951 Sex: male      Virtual Visit via Telephone Note   Because of Douglas Christensen's co-morbid illnesses, he is at least at moderate risk for complications without adequate follow up.  This format is felt to be most appropriate for this patient at this time.  The patient did not have access to video technology/had technical difficulties with video requiring transitioning to audio format only (telephone).  All issues noted in this document were discussed and addressed.  No physical exam could be performed with this format.   Patient Location: Home Provider Location: Office/Clinic   REASON FOR APPOINTMENT:    Follow up PAD  HISTORY OF PRESENT ILLNESS:   Douglas Christensen is a 73 y.o. male, who has undergone the following procedures:  03/21/2007: Left common iliac and right superficial femoral artery stents (Dr. Wyn) 11/16/2011: Left external iliac stenting (Dr. Wonda) 12/28/2012: Right femoral to above-knee popliteal artery bypass graft with ipsilateral vein 12/30/2014: Diagnostic angiography 01/29/2015: Left iliofemoral endarterectomy with vein patch angioplasty, atherectomy and stenting left superficial femoral artery 08/06/2019 atherectomy and stenting right common and external iliac artery.  Atherectomy and stenting right popliteal artery 11/02/2021: Diagnostic angiography  The patient was recently seen in [facility] clinic and found to have elevated velocities within his external iliac artery.  He walks about 30 yards before he gets thigh cramping and has to stop and rest.  Ultrasound suggested left extrailiac and right extrailiac stenosis  PAST MEDICAL HISTORY    Past Medical History:  Diagnosis Date   Anxiety    Arthritis    CAD (coronary artery disease)    Mild plaque (cath years ago); abnormal Myoview  04/2013 with subsequent CABG x 5 with LIMA to LAD, SVG to OM1, SVG to DX, SVG to PD  & PL. // Myoview  10/23: EF 62, no ischemia or infarction; low risk   Carotid artery disease (HCC)    Carotid US  07/2019 bilateral ICA 40-59; bilateral subclavian stenosis // Carotid US  9/21: Bilateral ICA 40-59; L VA occluded; R subclavian stenosis   Cataract    GERD (gastroesophageal reflux disease)    History of colonic polyps    History of echocardiogram    Echo 2/19: Mild LVH, EF 60-65, normal wall motion, grade 2 diastolic dysfunction, mild RAE   History of nuclear stress test    Myoview  2/19: inf/inf-lat/apical inf/ant-sept defect (?diaph atten - cannot rule out peri-infarct ischemia), PVCs/PACs/Mobitz 1 // Myoview  05/2019: EF 60, inf infarct with mild peri-infarct ischemia; no significant change when compared to prior study; Intermediate Risk   Hx of echocardiogram    Echo (9/15):  EF 55-60%; Gr 2 DD, mild BAE   Hyperlipidemia    Hypertension    LBP (low back pain)    Meralgia paresthetica of left side 2011   Polycythemia, 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  Echo 10/23: EF 55-60, no RWMA, mild LVH, normal RVSF, mild RAE, mild MR, AV sclerosis without stenosis, RAP 3    PVD (peripheral vascular disease) (HCC)    Stent to left common femoral and right superficial femoral.  2008.  50%  left renal    Second degree AV block, Mobitz type I  Holter 2/19: Sinus rhythm, average heart rate 72, frequent PVCs (burden 5%), second-degree type I AV block and periods of 2:1 heart block >> continue clinical managment and avoid AVN blocking agents   Shortness of breath    once in awhile; can happen at anytime (08/26/2013)   Sleep apnea    mod OSA, central sleep apnea/hypoapnea syndrome 11/22/12, CPAP every night    Subclavian artery stenosis (HCC)    Carotid US  07/2019 bilateral ICA 40-59; bilateral subclavian stenosis   Vitamin  D deficiency      FAMILY HISTORY   Family History  Problem Relation Age of Onset   Heart disease Mother        No clear CAD and Heart Disease before age 41   Hypertension Mother    Cancer Mother        ? colon ca   Hyperlipidemia Mother    Cancer Father        brain tumor   Alzheimer's disease Father    Colon cancer Neg Hx    Heart attack Neg Hx    Esophageal cancer Neg Hx    Liver cancer Neg Hx    Rectal cancer Neg Hx    Stomach cancer Neg Hx    Pancreatic cancer Neg Hx     SOCIAL HISTORY:   Social History   Socioeconomic History   Marital status: Widowed    Spouse name: Not on file   Number of children: 2   Years of education: Not on file   Highest education level: 12th grade  Occupational History   Occupation: Herbalist: [employer]  Tobacco Use   Smoking status: Former    Current packs/day: 0.00    Average packs/day: 2.0 packs/day for 47.0 years (94.0 ttl pk-yrs)    Types: Cigarettes    Start date: 12/17/1965    Quit date: 12/17/2012    Years since quitting: 11.6   Smokeless tobacco: Never  Vaping Use   Vaping status: Never Used  Substance and Sexual Activity   Alcohol use: Yes    Alcohol/week: 4.0 standard drinks of alcohol    Types: 4 Shots of liquor per week    Comment: 08/26/2013 3-4 mixed drinks/wk   Drug use: No   Sexual activity: Yes  Other Topics Concern   Not on file  Social History Narrative   Widow   Social Drivers of Health   Financial Resource Strain: Low Risk  (06/18/2024)   Overall Financial Resource Strain (CARDIA)    Difficulty of Paying Living Expenses: Not hard at all  Food Insecurity: No Food Insecurity (06/18/2024)   Hunger Vital Sign    Worried About Running Out of Food in the Last Year: Never true    Ran Out of Food in the Last Year: Never true  Transportation Needs: No Transportation Needs (06/18/2024)   PRAPARE - Administrator, Civil Service (Medical): No    Lack of Transportation  (Non-Medical): No  Physical Activity: Inactive (06/18/2024)   Exercise Vital Sign    Days of Exercise per Week: 0 days    Minutes of Exercise per Session: 0 min  Stress: Stress Concern Present (06/18/2024)   [employer] of Occupational Health - Occupational Stress Questionnaire    Feeling of Stress: To some extent  Social Connections: Moderately Isolated (06/18/2024)   Social Connection and Isolation Panel    Frequency of Communication with Friends and Family: Once a week    Frequency of Social Gatherings  with Friends and Family: Twice a week    Attends Religious Services: 1 to 4 times per year    Active Member of Clubs or Organizations: No    Attends Banker Meetings: Never    Marital Status: Widowed  Intimate Partner Violence: Not At Risk (06/18/2024)   Humiliation, Afraid, Rape, and Kick questionnaire    Fear of Current or Ex-Partner: No    Emotionally Abused: No    Physically Abused: No    Sexually Abused: No    ALLERGIES:    Allergies  Allergen Reactions   Roxicodone  [Oxycodone  Hcl] Other (See Comments)    hallucinations   Atorvastatin  Other (See Comments)    cramps   Zetia  [Ezetimibe ]     Upset stomach   Benazepril Cough   Gabapentin  Rash   Hydralazine  Hives and Rash    Skin lupus type reaction   Hydrocodone  Hives   Itraconazole  Nausea Only and Rash   Lipitor [Atorvastatin  Calcium ]     cramps   Oxycodone  Hives    CURRENT MEDICATIONS:    Current Outpatient Medications  Medication Sig Dispense Refill   acetaminophen  (TYLENOL ) 650 MG CR tablet Take 1,300 mg by mouth every 8 (eight) hours as needed for pain.     Ascorbic Acid (VITAMIN C PO) Take 1 tablet by mouth in the morning.     aspirin  EC 81 MG tablet Take 81 mg by mouth every other day. Swallow whole.     cholecalciferol  (VITAMIN D ) 1000 units tablet Take 1,000 Units by mouth daily.      clonazePAM  (KLONOPIN ) 0.5 MG tablet TAKE ONE TABLET BY MOUTH TWICE DAILY AS NEEDED FOR ANXIETY 60  tablet 3   cyclobenzaprine  (FLEXERIL ) 10 MG tablet Take 1 tablet (10 mg total) by mouth 3 (three) times daily as needed for muscle spasms. (Patient not taking: Reported on 06/18/2024) 60 tablet 1   furosemide  (LASIX ) 40 MG tablet TAKE 0.5-1 TABLETS (20-40 MG TOTAL) BY MOUTH DAILY AS NEEDED FOR EDEMA. 90 tablet 2   losartan  (COZAAR ) 100 MG tablet Take 1 tablet (100 mg total) by mouth daily.     metoprolol  succinate (TOPROL -XL) 50 MG 24 hr tablet Take 1.5 tablets (75 mg total) by mouth 2 (two) times daily. Take with or immediately following a meal. 270 tablet 0   metroNIDAZOLE  (METROCREAM ) 0.75 % cream Apply topically 2 (two) times daily. (Patient not taking: Reported on 06/18/2024) 45 g 3   nitroGLYCERIN  (NITROSTAT ) 0.4 MG SL tablet Place 1 tablet (0.4 mg total) under the tongue every 5 (five) minutes as needed for chest pain. 25 tablet 11   rosuvastatin  (CRESTOR ) 40 MG tablet Take 1 tablet (40 mg total) by mouth 3 (three) times a week.. 12 tablet 3   tadalafil , PAH, (ADCIRCA ) 20 MG tablet TAKE ONE TABLET (20MG  TOTAL) BY MOUTH EVERY THREE DAYS 10 tablet 6   traMADol  (ULTRAM ) 50 MG tablet Take 1 tablet (50 mg total) by mouth every 6 (six) hours as needed. 120 tablet 5   vitamin B-12 (CYANOCOBALAMIN ) 1000 MCG tablet Take 1,000 mcg by mouth daily.     No current facility-administered medications for this visit.    REVIEW OF SYSTEMS:   Please see the history of present illness.     All other systems reviewed and are negative.  PHYSICAL EXAM:    STUDIES:   I have reviewed the following: Right: > 50% stenosis in the right EIA stent.  Patent CIA stent without evidence of stenosis.   Left:  50 - 99% stenosis in the left EIA   ABI/TBIToday's ABIToday's TBIPrevious ABIPrevious TBI  +-------+-----------+-----------+------------+------------+  Right 1.22       0.90       1.14        0.85          +-------+-----------+-----------+------------+------------+  Left  0.79       0.67        Douglas Christensen          0.69          +-------+-----------+-----------+------------+------------+   ASSESSMENT and PLAN   Bilateral claudication: The patient complains of thigh cramping with activity.  Ultrasound suggests bilateral iliac stenosis.  I think the next step is to proceed with angiography so as to minimize his risk of stent or bypass graft occlusion.  This will be from a right femoral approach and intervention either stenting or angioplasty.    Time:   Today, I have spent 10 minutes with the patient with telehealth technology discussing the above problems.      Malvina Serene CLORE, MD, FACS [facility] [phone] Pager [phone]

## 2024-08-05 NOTE — H&P (View-Only) (Signed)
 Vascular and Vein Specialist of [facility]  Patient name: Douglas Christensen MRN: [id number] DOB: 10-25-1951 Sex: male      Virtual Visit via Telephone Note   Because of [name]'s co-morbid illnesses, he is at least at moderate risk for complications without adequate follow up.  This format is felt to be most appropriate for this patient at this time.  The patient did not have access to video technology/had technical difficulties with video requiring transitioning to audio format only (telephone).  All issues noted in this document were discussed and addressed.  No physical exam could be performed with this format.   Patient Location: Home Provider Location: Office/Clinic   REASON FOR APPOINTMENT:    Follow up PAD  HISTORY OF PRESENT ILLNESS:   Douglas Christensen is a 73 y.o. male, who has undergone the following procedures:  03/21/2007: Left common iliac and right superficial femoral artery stents (Dr. Wyn) 11/16/2011: Left external iliac stenting (Dr. Wonda) 12/28/2012: Right femoral to above-knee popliteal artery bypass graft with ipsilateral vein 12/30/2014: Diagnostic angiography 01/29/2015: Left iliofemoral endarterectomy with vein patch angioplasty, atherectomy and stenting left superficial femoral artery 08/06/2019 atherectomy and stenting right common and external iliac artery.  Atherectomy and stenting right popliteal artery 11/02/2021: Diagnostic angiography  The patient was recently seen in [facility] clinic and found to have elevated velocities within his external iliac artery.  He walks about 30 yards before he gets thigh cramping and has to stop and rest.  Ultrasound suggested left extrailiac and right extrailiac stenosis  PAST MEDICAL HISTORY    Past Medical History:  Diagnosis Date   Anxiety    Arthritis    CAD (coronary artery disease)    Mild plaque (cath years ago); abnormal Myoview  04/2013 with subsequent CABG x 5 with LIMA to LAD, SVG to OM1, SVG to DX, SVG to PD  & PL. // Myoview  10/23: EF 62, no ischemia or infarction; low risk   Carotid artery disease (HCC)    Carotid US  07/2019 bilateral ICA 40-59; bilateral subclavian stenosis // Carotid US  9/21: Bilateral ICA 40-59; L VA occluded; R subclavian stenosis   Cataract    GERD (gastroesophageal reflux disease)    History of colonic polyps    History of echocardiogram    Echo 2/19: Mild LVH, EF 60-65, normal wall motion, grade 2 diastolic dysfunction, mild RAE   History of nuclear stress test    Myoview  2/19: inf/inf-lat/apical inf/ant-sept defect (?diaph atten - cannot rule out peri-infarct ischemia), PVCs/PACs/Mobitz 1 // Myoview  05/2019: EF 60, inf infarct with mild peri-infarct ischemia; no significant change when compared to prior study; Intermediate Risk   Hx of echocardiogram    Echo (9/15):  EF 55-60%; Gr 2 DD, mild BAE   Hyperlipidemia    Hypertension    LBP (low back pain)    Meralgia paresthetica of left side 2011   Polycythemia, 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  Echo 10/23: EF 55-60, no RWMA, mild LVH, normal RVSF, mild RAE, mild MR, AV sclerosis without stenosis, RAP 3    PVD (peripheral vascular disease) (HCC)    Stent to left common femoral and right superficial femoral.  2008.  50%  left renal    Second degree AV block, Mobitz type I  Holter 2/19: Sinus rhythm, average heart rate 72, frequent PVCs (burden 5%), second-degree type I AV block and periods of 2:1 heart block >> continue clinical managment and avoid AVN blocking agents   Shortness of breath    once in awhile; can happen at anytime (08/26/2013)   Sleep apnea    mod OSA, central sleep apnea/hypoapnea syndrome 11/22/12, CPAP every night    Subclavian artery stenosis (HCC)    Carotid US  07/2019 bilateral ICA 40-59; bilateral subclavian stenosis   Vitamin  D deficiency      FAMILY HISTORY   Family History  Problem Relation Age of Onset   Heart disease Mother        No clear CAD and Heart Disease before age 41   Hypertension Mother    Cancer Mother        ? colon ca   Hyperlipidemia Mother    Cancer Father        brain tumor   Alzheimer's disease Father    Colon cancer Neg Hx    Heart attack Neg Hx    Esophageal cancer Neg Hx    Liver cancer Neg Hx    Rectal cancer Neg Hx    Stomach cancer Neg Hx    Pancreatic cancer Neg Hx     SOCIAL HISTORY:   Social History   Socioeconomic History   Marital status: Widowed    Spouse name: Not on file   Number of children: 2   Years of education: Not on file   Highest education level: 12th grade  Occupational History   Occupation: Herbalist: [employer]  Tobacco Use   Smoking status: Former    Current packs/day: 0.00    Average packs/day: 2.0 packs/day for 47.0 years (94.0 ttl pk-yrs)    Types: Cigarettes    Start date: 12/17/1965    Quit date: 12/17/2012    Years since quitting: 11.6   Smokeless tobacco: Never  Vaping Use   Vaping status: Never Used  Substance and Sexual Activity   Alcohol use: Yes    Alcohol/week: 4.0 standard drinks of alcohol    Types: 4 Shots of liquor per week    Comment: 08/26/2013 3-4 mixed drinks/wk   Drug use: No   Sexual activity: Yes  Other Topics Concern   Not on file  Social History Narrative   Widow   Social Drivers of Health   Financial Resource Strain: Low Risk  (06/18/2024)   Overall Financial Resource Strain (CARDIA)    Difficulty of Paying Living Expenses: Not hard at all  Food Insecurity: No Food Insecurity (06/18/2024)   Hunger Vital Sign    Worried About Running Out of Food in the Last Year: Never true    Ran Out of Food in the Last Year: Never true  Transportation Needs: No Transportation Needs (06/18/2024)   PRAPARE - Administrator, Civil Service (Medical): No    Lack of Transportation  (Non-Medical): No  Physical Activity: Inactive (06/18/2024)   Exercise Vital Sign    Days of Exercise per Week: 0 days    Minutes of Exercise per Session: 0 min  Stress: Stress Concern Present (06/18/2024)   [employer] of Occupational Health - Occupational Stress Questionnaire    Feeling of Stress: To some extent  Social Connections: Moderately Isolated (06/18/2024)   Social Connection and Isolation Panel    Frequency of Communication with Friends and Family: Once a week    Frequency of Social Gatherings  with Friends and Family: Twice a week    Attends Religious Services: 1 to 4 times per year    Active Member of Clubs or Organizations: No    Attends Banker Meetings: Never    Marital Status: Widowed  Intimate Partner Violence: Not At Risk (06/18/2024)   Humiliation, Afraid, Rape, and Kick questionnaire    Fear of Current or Ex-Partner: No    Emotionally Abused: No    Physically Abused: No    Sexually Abused: No    ALLERGIES:    Allergies  Allergen Reactions   Roxicodone  [Oxycodone  Hcl] Other (See Comments)    hallucinations   Atorvastatin  Other (See Comments)    cramps   Zetia  [Ezetimibe ]     Upset stomach   Benazepril Cough   Gabapentin  Rash   Hydralazine  Hives and Rash    Skin lupus type reaction   Hydrocodone  Hives   Itraconazole  Nausea Only and Rash   Lipitor [Atorvastatin  Calcium ]     cramps   Oxycodone  Hives    CURRENT MEDICATIONS:    Current Outpatient Medications  Medication Sig Dispense Refill   acetaminophen  (TYLENOL ) 650 MG CR tablet Take 1,300 mg by mouth every 8 (eight) hours as needed for pain.     Ascorbic Acid (VITAMIN C PO) Take 1 tablet by mouth in the morning.     aspirin  EC 81 MG tablet Take 81 mg by mouth every other day. Swallow whole.     cholecalciferol  (VITAMIN D ) 1000 units tablet Take 1,000 Units by mouth daily.      clonazePAM  (KLONOPIN ) 0.5 MG tablet TAKE ONE TABLET BY MOUTH TWICE DAILY AS NEEDED FOR ANXIETY 60  tablet 3   cyclobenzaprine  (FLEXERIL ) 10 MG tablet Take 1 tablet (10 mg total) by mouth 3 (three) times daily as needed for muscle spasms. (Patient not taking: Reported on 06/18/2024) 60 tablet 1   furosemide  (LASIX ) 40 MG tablet TAKE 0.5-1 TABLETS (20-40 MG TOTAL) BY MOUTH DAILY AS NEEDED FOR EDEMA. 90 tablet 2   losartan  (COZAAR ) 100 MG tablet Take 1 tablet (100 mg total) by mouth daily.     metoprolol  succinate (TOPROL -XL) 50 MG 24 hr tablet Take 1.5 tablets (75 mg total) by mouth 2 (two) times daily. Take with or immediately following a meal. 270 tablet 0   metroNIDAZOLE  (METROCREAM ) 0.75 % cream Apply topically 2 (two) times daily. (Patient not taking: Reported on 06/18/2024) 45 g 3   nitroGLYCERIN  (NITROSTAT ) 0.4 MG SL tablet Place 1 tablet (0.4 mg total) under the tongue every 5 (five) minutes as needed for chest pain. 25 tablet 11   rosuvastatin  (CRESTOR ) 40 MG tablet Take 1 tablet (40 mg total) by mouth 3 (three) times a week.. 12 tablet 3   tadalafil , PAH, (ADCIRCA ) 20 MG tablet TAKE ONE TABLET (20MG  TOTAL) BY MOUTH EVERY THREE DAYS 10 tablet 6   traMADol  (ULTRAM ) 50 MG tablet Take 1 tablet (50 mg total) by mouth every 6 (six) hours as needed. 120 tablet 5   vitamin B-12 (CYANOCOBALAMIN ) 1000 MCG tablet Take 1,000 mcg by mouth daily.     No current facility-administered medications for this visit.    REVIEW OF SYSTEMS:   Please see the history of present illness.     All other systems reviewed and are negative.  PHYSICAL EXAM:    STUDIES:   I have reviewed the following: Right: > 50% stenosis in the right EIA stent.  Patent CIA stent without evidence of stenosis.   Left:  50 - 99% stenosis in the left EIA   ABI/TBIToday's ABIToday's TBIPrevious ABIPrevious TBI  +-------+-----------+-----------+------------+------------+  Right 1.22       0.90       1.14        0.85          +-------+-----------+-----------+------------+------------+  Left  0.79       0.67        [place]          0.69          +-------+-----------+-----------+------------+------------+   ASSESSMENT and PLAN   Bilateral claudication: The patient complains of thigh cramping with activity.  Ultrasound suggests bilateral iliac stenosis.  I think the next step is to proceed with angiography so as to minimize his risk of stent or bypass graft occlusion.  This will be from a right femoral approach and intervention either stenting or angioplasty.    Time:   Today, I have spent 10 minutes with the patient with telehealth technology discussing the above problems.      Malvina Serene CLORE, MD, FACS [facility] Vein Specialists of [facility] [phone] Pager [phone]

## 2024-08-12 ENCOUNTER — Other Ambulatory Visit: Payer: Self-pay | Admitting: Cardiovascular Disease

## 2024-08-13 ENCOUNTER — Encounter (HOSPITAL_COMMUNITY)
Admission: RE | Disposition: A | Discharge status: Home / Self Care | Payer: Self-pay | Source: Home / Self Care | Attending: Surgery

## 2024-08-13 ENCOUNTER — Observation Stay (HOSPITAL_COMMUNITY)
Admission: RE | Admit: 2024-08-13 | Discharge: 2024-08-13 | Disposition: A | Discharge status: Home / Self Care | Attending: Surgery | Admitting: Surgery

## 2024-08-13 ENCOUNTER — Other Ambulatory Visit: Payer: Self-pay

## 2024-08-13 DIAGNOSIS — Y832 Surgical operation with anastomosis, bypass or graft as the cause of abnormal reaction of the patient, or of later complication, without mention of misadventure at the time of the procedure: Secondary | ICD-10-CM | POA: Insufficient documentation

## 2024-08-13 DIAGNOSIS — Z7982 Long term (current) use of aspirin: Secondary | ICD-10-CM | POA: Insufficient documentation

## 2024-08-13 DIAGNOSIS — F1090 Alcohol use, unspecified, uncomplicated: Secondary | ICD-10-CM | POA: Diagnosis not present

## 2024-08-13 DIAGNOSIS — I2510 Atherosclerotic heart disease of native coronary artery without angina pectoris: Secondary | ICD-10-CM | POA: Insufficient documentation

## 2024-08-13 DIAGNOSIS — I70213 Atherosclerosis of native arteries of extremities with intermittent claudication, bilateral legs: Secondary | ICD-10-CM | POA: Diagnosis not present

## 2024-08-13 DIAGNOSIS — I739 Peripheral vascular disease, unspecified: Secondary | ICD-10-CM | POA: Diagnosis present

## 2024-08-13 DIAGNOSIS — Z87891 Personal history of nicotine dependence: Secondary | ICD-10-CM | POA: Diagnosis not present

## 2024-08-13 DIAGNOSIS — I10 Essential (primary) hypertension: Secondary | ICD-10-CM | POA: Insufficient documentation

## 2024-08-13 DIAGNOSIS — Z79899 Other long term (current) drug therapy: Secondary | ICD-10-CM | POA: Insufficient documentation

## 2024-08-13 DIAGNOSIS — T82856A Stenosis of peripheral vascular stent, initial encounter: Secondary | ICD-10-CM | POA: Diagnosis not present

## 2024-08-13 HISTORY — PX: ABDOMINAL AORTOGRAM W/LOWER EXTREMITY: CATH118223

## 2024-08-13 HISTORY — PX: LOWER EXTREMITY INTERVENTION: CATH118252

## 2024-08-13 HISTORY — PX: LOWER EXTREMITY ANGIOGRAPHY: CATH118251

## 2024-08-13 LAB — POCT I-STAT, CHEM 8
BUN: 20 mg/dL (ref 8–23)
Calcium, Ion: 1.23 mmol/L (ref 1.15–1.40)
Chloride: 106 mmol/L (ref 98–111)
Creatinine, Ser: 1.3 mg/dL — ABNORMAL HIGH (ref 0.61–1.24)
Glucose, Bld: 86 mg/dL (ref 70–99)
HCT: 45 % (ref 39.0–52.0)
Hemoglobin: 15.3 g/dL (ref 13.0–17.0)
Potassium: 4.6 mmol/L (ref 3.5–5.1)
Sodium: 142 mmol/L (ref 135–145)
TCO2: 27 mmol/L (ref 22–32)

## 2024-08-13 LAB — POCT ACTIVATED CLOTTING TIME: Activated Clotting Time: 256 s

## 2024-08-13 MED ORDER — HEPARIN SODIUM (PORCINE) 1000 UNIT/ML IJ SOLN
INTRAMUSCULAR | Status: AC
  Filled 2024-08-13: qty 10

## 2024-08-13 MED ORDER — HEPARIN (PORCINE) IN NACL 1000-0.9 UT/500ML-% IV SOLN
INTRAVENOUS | Status: DC | PRN
  Administered 2024-08-13 (×2): 500 mL

## 2024-08-13 MED ORDER — MORPHINE SULFATE (PF) 2 MG/ML IV SOLN: 2.0000 mg | INTRAVENOUS | Status: DC | PRN

## 2024-08-13 MED ORDER — SODIUM CHLORIDE 0.9% FLUSH: 3.0000 mL | Freq: Two times a day (BID) | INTRAVENOUS | Status: DC

## 2024-08-13 MED ORDER — SODIUM CHLORIDE 0.9 % IV SOLN: 250.0000 mL | INTRAVENOUS | Status: DC | PRN

## 2024-08-13 MED ORDER — MIDAZOLAM HCL 2 MG/2ML IJ SOLN
INTRAMUSCULAR | Status: AC
  Filled 2024-08-13: qty 2

## 2024-08-13 MED ORDER — ACETAMINOPHEN 325 MG PO TABS: 650.0000 mg | ORAL_TABLET | ORAL | Status: DC | PRN

## 2024-08-13 MED ORDER — ONDANSETRON HCL 4 MG/2ML IJ SOLN: 4.0000 mg | Freq: Four times a day (QID) | INTRAMUSCULAR | Status: DC | PRN

## 2024-08-13 MED ORDER — FENTANYL CITRATE (PF) 100 MCG/2ML IJ SOLN
INTRAMUSCULAR | Status: DC | PRN
  Administered 2024-08-13: 50 ug via INTRAVENOUS

## 2024-08-13 MED ORDER — IODIXANOL 320 MG/ML IV SOLN
INTRAVENOUS | Status: DC | PRN
  Administered 2024-08-13: 140 mL

## 2024-08-13 MED ORDER — CLOPIDOGREL BISULFATE 300 MG PO TABS
ORAL_TABLET | ORAL | Status: AC
  Filled 2024-08-13: qty 1

## 2024-08-13 MED ORDER — FENTANYL CITRATE (PF) 100 MCG/2ML IJ SOLN
INTRAMUSCULAR | Status: AC
  Filled 2024-08-13: qty 2

## 2024-08-13 MED ORDER — CLOPIDOGREL BISULFATE 300 MG PO TABS
ORAL_TABLET | ORAL | Status: DC | PRN
  Administered 2024-08-13: 300 mg via ORAL

## 2024-08-13 MED ORDER — LIDOCAINE HCL (PF) 1 % IJ SOLN
INTRAMUSCULAR | Status: DC | PRN
  Administered 2024-08-13: 15 mL

## 2024-08-13 MED ORDER — HEPARIN SODIUM (PORCINE) 1000 UNIT/ML IJ SOLN
INTRAMUSCULAR | Status: DC | PRN
  Administered 2024-08-13: 2000 [IU] via INTRAVENOUS
  Administered 2024-08-13: 8000 [IU] via INTRAVENOUS

## 2024-08-13 MED ORDER — CLOPIDOGREL BISULFATE 75 MG PO TABS: 75.0000 mg | ORAL_TABLET | Freq: Every day | ORAL | Status: DC

## 2024-08-13 MED ORDER — LIDOCAINE HCL (PF) 1 % IJ SOLN
INTRAMUSCULAR | Status: AC
  Filled 2024-08-13: qty 30

## 2024-08-13 MED ORDER — SODIUM CHLORIDE 0.9 % WEIGHT BASED INFUSION: 1.0000 mL/kg/h | INTRAVENOUS | Status: DC

## 2024-08-13 MED ORDER — ASPIRIN 81 MG PO TBEC: 81.0000 mg | DELAYED_RELEASE_TABLET | Freq: Every day | ORAL | Status: DC

## 2024-08-13 MED ORDER — LABETALOL HCL 5 MG/ML IV SOLN
10.0000 mg | INTRAVENOUS | Status: DC | PRN
  Filled 2024-08-13: qty 4

## 2024-08-13 MED ORDER — MIDAZOLAM HCL 2 MG/2ML IJ SOLN
INTRAMUSCULAR | Status: DC | PRN
  Administered 2024-08-13: 2 mg via INTRAVENOUS

## 2024-08-13 MED ORDER — SODIUM CHLORIDE 0.9 % IV SOLN: INTRAVENOUS | Status: DC

## 2024-08-13 MED ORDER — SODIUM CHLORIDE 0.9% FLUSH: 3.0000 mL | INTRAVENOUS | Status: DC | PRN

## 2024-08-13 NOTE — Progress Notes (Signed)
 Patient in supine in stretcher. Right Groin accessed and closure device used. Dressing intact. Site intact. Supple to touch. Patient currently sleeping. Vitals being monitored. Waiting for assigned room to be cleaned for transfer.

## 2024-08-13 NOTE — Progress Notes (Signed)
 Patient brought to 4E from PACU. Telemetry box applied, CCMD notified. Patient oriented to room and staff. Call bell in reach.   08/13/24 1208  Vitals  Temp (!) 97.4 F (36.3 C)  Temp Source Oral  BP (!) 165/76  MAP (mmHg) 102  BP Location Right Arm  BP Method Automatic  Patient Position (if appropriate) Lying  Pulse Rate 60  Pulse Rate Source Monitor  ECG Heart Rate 60  Resp 18  Level of Consciousness  Level of Consciousness Alert  MEWS COLOR  MEWS Score Color Green  Oxygen Therapy  SpO2 100 %  O2 Device Room Air  MEWS Score  MEWS Temp 0  MEWS Systolic 0  MEWS Pulse 0  MEWS RR 0  MEWS LOC 0  MEWS Score 0

## 2024-08-13 NOTE — Op Note (Signed)
 Patient name: Douglas Christensen MRN: [id number] DOB: 1950-12-19 Sex: male  08/13/2024 Pre-operative Diagnosis: Bilateral claudication Post-operative diagnosis:  Same Surgeon:  [name] Procedure Performed:  1.  Ultrasound-guided access, right femoral artery  2.  Abdominal aortogram  3.  Bilateral lower extremity angiogram  4.  Selective injection with catheter in left popliteal artery  5.  Shockwave intra-arterial lithotripsy of: Left popliteal and superficial femoral artery, left external iliac artery, right external iliac artery  6.  Drug-eluting stent, left superficial femoral/popliteal artery  7.  Conscious sedation, 123 minutes  8.  Closure device, Celt   Indications: This is a 73 year old gentleman who has undergone multiple previous interventions and has developed recurrent claudication symptoms and ultrasound evidence of in-stent stenosis.  He is here today for angiography  Procedure:  The patient was identified in the holding area and taken to room 8.  The patient was then placed supine on the table and prepped and draped in the usual sterile fashion.  A time out was called.  Conscious sedation was administered with the use of IV fentanyl  and Versed  under continuous physician and nurse monitoring.  Heart rate, blood pressure, and oxygen saturation were continuously monitored.  Total sedation time was 123 minutes.  Ultrasound was used to evaluate the right common femoral artery.  It was patent .  A digital ultrasound image was acquired.  A micropuncture needle was used to access the right common femoral artery under ultrasound guidance.  An 018 wire was advanced without resistance and a micropuncture sheath was placed.  The 018 wire was removed and a benson wire was placed.  The micropuncture sheath was exchanged for a 5 french sheath.  An omniflush catheter was advanced over the wire to the level of L-1.  An abdominal angiogram was obtained.  Next, using the omniflush catheter and a  benson wire, the aortic bifurcation was crossed and the catheter was placed into theleft external iliac artery and left runoff was obtained.  right runoff was performed via retrograde sheath injections.  Findings:   Aortogram: No significant renal artery stenosis.  The infrarenal abdominal aorta was heavily calcified but without significant stenosis.  A stent within the right common iliac artery appears to be widely patent.  The stent in the right external iliac artery has some haziness to it but is patent throughout its course.  The hypogastric artery is occluded.  The left common iliac artery is widely patent.  The left external iliac artery and its stent have a greater than 70% stenosis  Right Lower Extremity: The right common femoral and profundofemoral artery are widely patent.  There is a bypass graft originating in the common femoral artery down to the above-knee popliteal artery.  There does appear to be a significant outflow stenosis to the bypass  Left Lower Extremity: The left common femoral profundofemoral artery are heavily calcified but patent without stenosis.  Stents are visualized within the superficial femoral and popliteal artery.  There is significant in-stent restenosis with heavy calcification.  In the native above-knee popliteal artery there is heavily calcified greater than 80% stenosis.  The below-knee popliteal artery is patent without significant calcification with two-vessel runoff via the peroneal and anterior tibial artery  Intervention: After the above images were acquired the decision was made to proceed with intervention.  A 6 French 45 cm sheath was advanced into the left extrailiac artery.  I then tried to advance a 014 wire into the popliteal artery.  Once I got  through the size used a 6 x 80 shockwave balloon and treated the above-knee popliteal artery for the duration of the balloon.  Follow-up imaging showed improved results however there was still residual stenosis that  I felt needed to be stented and so over a Glidewire advantage a 7 x 120 stent was deployed from the joint space back into the existing stent with about 1.5 cm overlap.  This was postdilated with a 5 mm balloon.  Follow-up imaging showed inline flow down across the knee without significant residual stenosis.  I then switched out to a 014 wire and used a 7 x 60 shockwave balloon to treat the heavily calcified in-stent stenosis up in the operative thigh.  Multiple treatments were utilized and follow-up imaging showed significant improvement with flow through this area.  I then used a 760 shockwave balloon to treat the lesion in the left external iliac artery for multiple cycles.  Follow-up imaging showed resolution of the stenosis with disease less than 20%.  I then used the same balloon to treat the external iliac stent going across the origin of the hypogastric artery and across the distal edge of the stent.  Completion imaging shows no residual stenosis.  At this point, the groin was closed with a Celt  Impression:  #1  Progression of native disease within the left popliteal artery beginning just distal to the joint space.  This was treated with a 6 mm shockwave balloon with residual stenosis requiring stenting using a 7 x 120 Eluvia which landed just proximal to the joint space.  #2  Heavily calcified in-stent stenosis within the previously placed left SFA stents.  This was addressed using a 7 mm shockwave balloon  #3  Greater than 70% left in-stent external iliac artery stenosis resolved after shockwave intra-arterial lithotripsy with a 7 mm balloon  #4  Successful intra-arterial lithotripsy of the right external iliac stent  #5  The patient's right femoral-popliteal bypass is patent however there does appear to be native disease progression with a greater than 70% stenosis at the distal anastomosis.  The patient will need to be brought back for intervention on the right leg   V. [name], M.D.,  [facility] of [place] Office: [phone] Pager:  [phone]

## 2024-08-13 NOTE — Progress Notes (Signed)
 Patient wants to go home. PA made aware.

## 2024-08-13 NOTE — Interval H&P Note (Signed)
 History and Physical Interval Note:  08/13/2024 7:48 AM  Douglas Christensen  has presented today for surgery, with the diagnosis of atherosclerosis bilateral with intermitted claudication.  The various methods of treatment have been discussed with the patient and family. After consideration of risks, benefits and other options for treatment, the patient has consented to  Procedure(s): ABDOMINAL AORTOGRAM W/LOWER EXTREMITY (N/A) Lower Extremity Angiography (N/A) LOWER EXTREMITY INTERVENTION (N/A) as a surgical intervention.  The patient's history has been reviewed, patient examined, no change in status, stable for surgery.  I have reviewed the patient's chart and labs.  Questions were answered to the patient's satisfaction.     [name]

## 2024-08-13 NOTE — Progress Notes (Signed)
 Patient given discharge instructions. Daughter present. PIV removed. Telemetry box removed, CCMD notified. Patient packed belongings. Patient taken to vehicle in wheelchair by staff.  Josean Lycan L Jessikah Dicker, RN

## 2024-08-14 ENCOUNTER — Other Ambulatory Visit: Payer: Self-pay

## 2024-08-14 ENCOUNTER — Encounter (HOSPITAL_COMMUNITY): Payer: Self-pay | Admitting: Surgery

## 2024-08-14 ENCOUNTER — Telehealth: Payer: Self-pay

## 2024-08-14 DIAGNOSIS — I70213 Atherosclerosis of native arteries of extremities with intermittent claudication, bilateral legs: Secondary | ICD-10-CM

## 2024-08-14 NOTE — Telephone Encounter (Signed)
 Pt called with c/o pain/numbness in LLE knee area s/p AGM yesterday. He denies swelling, color/temperature change. He takes Tramadol  for neuropathy. Pt states it is numb where it is noticeable, and he can feel his knee. Per APP he has been advised to continue to monitor this and let us  know if anything worsens/changes and to take XS Tylenol .

## 2024-08-15 NOTE — Discharge Summary (Signed)
 Discharge Summary  Patient ID: Douglas Christensen [id number] 73 y.o. 13-Jan-1951  Admit date: 08/13/2024  Discharge date and time: 08/13/2024  6:14 PM   Admitting Physician: Gaile LELON New, MD   Discharge Physician: Gaile LELON New, MD   Admission Diagnoses: PAD (peripheral artery disease) [facility]) [I73.9]  Discharge Diagnoses: PAD (peripheral artery disease) (HCC) [I73.9]  Admission Condition: fair  Discharged Condition: good  Indication for Admission: This is a 73 year old gentleman who has undergone multiple previous interventions and has developed recurrent claudication symptoms and ultrasound evidence of in-stent stenosis. He is here today for angiography   Hospital Course: The patient presented to [facility] 08/13/2024 for bilateral lower extremity angiogram with shockwave lithotripsy of the left popliteal and superficial femoral artery, left external iliac artery, and right external iliac artery and left SFA/popliteal drug-eluting stent by Dr. New.  He was transferred to PACU in stable condition.  His right groin access site was intact without hematoma.  He was admitted for observation since he had no family to stay with him overnight.  Later in the evening the patient found out that he could stay with his daughter overnight.  His vitals were stable and right groin access site remained intact without bleeding or hematoma.  He was discharged home on 08/13/2024.  Consults: None  Treatments: analgesia: fentanyl , lidocaine , anticoagulation: heparin , and surgery: bilateral lower extremity angiogram with shockwave lithotripsy of the left popliteal and superficial femoral artery, left external iliac artery, and right external iliac artery and left SFA/popliteal drug-eluting stent   Discharge Exam: See progress note  Vitals:   08/13/24 1530 08/13/24 1545  BP:  (!) 178/88  Pulse:  61  Resp:  20  Temp: 97.6 F (36.4 C)   SpO2:  99%     Disposition: Discharge disposition: 01-Home  or Self Care       Patient Instructions:  Allergies as of 08/13/2024       Reactions   Roxicodone  [oxycodone  Hcl] Other (See Comments)   hallucinations   Atorvastatin  Other (See Comments)   cramps   Zetia  [ezetimibe ]    Upset stomach   Benazepril Cough   Gabapentin  Rash   Hydralazine  Hives, Rash   Skin lupus type reaction   Hydrocodone  Hives   Itraconazole  Nausea Only, Rash   Lipitor [atorvastatin  Calcium ]    cramps   Oxycodone  Hives        Medication List     TAKE these medications    acetaminophen  650 MG CR tablet Commonly known as: TYLENOL  Take 1,300 mg by mouth every 8 (eight) hours as needed for pain.   aspirin  EC 81 MG tablet Take 81 mg by mouth every other day. Swallow whole.   cholecalciferol  1000 units tablet Commonly known as: VITAMIN D  Take 1,000 Units by mouth daily.   clonazePAM  0.5 MG tablet Commonly known as: KLONOPIN  TAKE ONE TABLET BY MOUTH TWICE DAILY AS NEEDED FOR ANXIETY   cyanocobalamin  1000 MCG tablet Commonly known as: VITAMIN B12 Take 1,000 mcg by mouth daily.   cyclobenzaprine  10 MG tablet Commonly known as: FLEXERIL  Take 1 tablet (10 mg total) by mouth 3 (three) times daily as needed for muscle spasms.   furosemide  40 MG tablet Commonly known as: LASIX  TAKE 0.5-1 TABLETS (20-40 MG TOTAL) BY MOUTH DAILY AS NEEDED FOR EDEMA.   losartan  50 MG tablet Commonly known as: COZAAR  Take 100 mg by mouth daily.   losartan  100 MG tablet Commonly known as: COZAAR  Take 1 tablet (100 mg total) by  mouth daily.   metoprolol  succinate 50 MG 24 hr tablet Commonly known as: TOPROL -XL Take 1.5 tablets (75 mg total) by mouth 2 (two) times daily. Take with or immediately following a meal.   metroNIDAZOLE  0.75 % cream Commonly known as: METROCREAM  Apply topically 2 (two) times daily.   nitroGLYCERIN  0.4 MG SL tablet Commonly known as: NITROSTAT  Place 1 tablet (0.4 mg total) under the tongue every 5 (five) minutes as needed for chest  pain.   NON FORMULARY Pt uses a cpap nightly   rosuvastatin  40 MG tablet Commonly known as: CRESTOR  Take 1 tablet (40 mg total) by mouth 3 (three) times a week. .   spironolactone 25 MG tablet Commonly known as: ALDACTONE Take 25 mg by mouth daily.   tadalafil  (PAH) 20 MG tablet Commonly known as: ADCIRCA  TAKE ONE TABLET (20MG  TOTAL) BY MOUTH EVERY THREE DAYS   tadalafil  20 MG tablet Commonly known as: CIALIS  Take 20 mg by mouth every 3 (three) days.   traMADol  50 MG tablet Commonly known as: ULTRAM  Take 1 tablet (50 mg total) by mouth every 6 (six) hours as needed.       Activity: activity as tolerated, no driving for 2 weeks, no driving while on analgesics, and no heavy lifting for 4 weeks Diet: low fat, low cholesterol diet Wound Care: keep wound clean and dry  He will return to [facility] 09[facility]/2024 for repeat lower extremity angiogram with Dr.Brabham  Signed: Ahmed Holster, PA-C 08/15/2024 2:27 PM [facility] Office: [phone]

## 2024-08-27 ENCOUNTER — Ambulatory Visit (INDEPENDENT_AMBULATORY_CARE_PROVIDER_SITE_OTHER)

## 2024-08-27 DIAGNOSIS — I6523 Occlusion and stenosis of bilateral carotid arteries: Secondary | ICD-10-CM | POA: Diagnosis not present

## 2024-08-28 LAB — CUP PACEART REMOTE DEVICE CHECK
Battery Remaining Longevity: 90 mo
Battery Voltage: 2.98 V
Brady Statistic AP VP Percent: 62.19 %
Brady Statistic AP VS Percent: 0 %
Brady Statistic AS VP Percent: 35.98 %
Brady Statistic AS VS Percent: 1.82 %
Brady Statistic RA Percent Paced: 62.8 %
Brady Statistic RV Percent Paced: 98.17 %
Date Time Interrogation Session: 20250929225643
Implantable Lead Connection Status: 753985
Implantable Lead Connection Status: 753985
Implantable Lead Implant Date: 20201231
Implantable Lead Implant Date: 20201231
Implantable Lead Location: 753859
Implantable Lead Location: 753860
Implantable Lead Model: 3830
Implantable Lead Model: 5076
Implantable Pulse Generator Implant Date: 20201231
Lead Channel Impedance Value: 380 Ohm
Lead Channel Impedance Value: 437 Ohm
Lead Channel Impedance Value: 513 Ohm
Lead Channel Impedance Value: 627 Ohm
Lead Channel Pacing Threshold Amplitude: 0.625 V
Lead Channel Pacing Threshold Amplitude: 0.875 V
Lead Channel Pacing Threshold Pulse Width: 0.4 ms
Lead Channel Pacing Threshold Pulse Width: 0.4 ms
Lead Channel Sensing Intrinsic Amplitude: 16.75 mV
Lead Channel Sensing Intrinsic Amplitude: 16.75 mV
Lead Channel Sensing Intrinsic Amplitude: 4.625 mV
Lead Channel Sensing Intrinsic Amplitude: 4.625 mV
Lead Channel Setting Pacing Amplitude: 1.5 V
Lead Channel Setting Pacing Amplitude: 2.5 V
Lead Channel Setting Pacing Pulse Width: 0.4 ms
Lead Channel Setting Sensing Sensitivity: 1.2 mV
Zone Setting Status: 755011
Zone Setting Status: 755011

## 2024-08-29 NOTE — Progress Notes (Signed)
 Remote PPM Transmission

## 2024-09-02 ENCOUNTER — Ambulatory Visit: Payer: Self-pay

## 2024-09-02 ENCOUNTER — Telehealth: Payer: Self-pay

## 2024-09-02 NOTE — Telephone Encounter (Signed)
 Pt called to let us  know his LLE, mainly foot area is still bruised from his AGM on 9/16. He feels it is very slowly improving and has some foot swelling. He has not elevated it at all in the last 3-4 day. I have advised him to focus on elevating it periodically the next few days and to see if that helps. He is aware to call us  if this worsens or does not improve.

## 2024-09-02 NOTE — Progress Notes (Signed)
 Remote PPM Transmission

## 2024-09-02 NOTE — Telephone Encounter (Signed)
 cough, fever of 100, headache, body aches- it has been 8 days- [phone]  Reason for Disposition . Fever present > 3 days (72 hours)  Answer Assessment - Initial Assessment Questions Pt has had symptoms for last 8 days. Pt has been trying OTC cold medications with no improvement. Pt lives 3.5 hours away and asked for virutal appt. RN advised he would need to be seen in person and suggested UC due to distance. Pt declined. Ill just wait it out. I'm not going to UC.     1. LOCATION: Where does it hurt?      head 2. ONSET: When did the sinus pain start?  (e.g., hours, days)      8 days ago  3. SEVERITY: How bad is the pain?   (Scale 0-10; or none, mild, moderate or severe)     na 4. RECURRENT SYMPTOM: Have you ever had sinus problems before? If Yes, ask: When was the last time? and What happened that time?      na 5. NASAL CONGESTION: Is the nose blocked? If Yes, ask: Can you open it or must you breathe through your mouth?     yes 6. NASAL DISCHARGE: Do you have discharge from your nose? If so ask, What color?     Yes- clear to yellow  7. FEVER: Do you have a fever? If Yes, ask: What is it, how was it measured, and when did it start?      100 this morning  8. OTHER SYMPTOMS: Do you have any other symptoms? (e.g., sore throat, cough, earache, difficulty breathing)     Ears clogged, headache, body aches, productive couigh  Protocols used: Sinus Pain or Congestion-A-AH

## 2024-09-02 NOTE — Telephone Encounter (Signed)
 FYI Only or Action Required?: Action required by provider: update on patient condition.  Patient was last seen in primary care on 03/20/2024 by Plotnikov, Karlynn GAILS, MD.  Called Nurse Triage reporting Sinusitis.  Symptoms began a week ago.  Interventions attempted: OTC medications: cold medication.  Symptoms are: unchanged.  Triage Disposition: See Physician Within 24 Hours  Patient/caregiver understands and will follow disposition?: No

## 2024-09-09 ENCOUNTER — Ambulatory Visit: Payer: Self-pay | Admitting: Cardiovascular Disease

## 2024-09-10 ENCOUNTER — Encounter (HOSPITAL_COMMUNITY)
Admission: RE | Disposition: A | Discharge status: Home / Self Care | Payer: Self-pay | Source: Home / Self Care | Attending: Surgery

## 2024-09-10 ENCOUNTER — Ambulatory Visit (HOSPITAL_COMMUNITY)
Admission: RE | Admit: 2024-09-10 | Discharge: 2024-09-10 | Disposition: A | Discharge status: Home / Self Care | Attending: Surgery | Admitting: Surgery

## 2024-09-10 ENCOUNTER — Other Ambulatory Visit: Payer: Self-pay

## 2024-09-10 DIAGNOSIS — I70213 Atherosclerosis of native arteries of extremities with intermittent claudication, bilateral legs: Secondary | ICD-10-CM | POA: Insufficient documentation

## 2024-09-10 DIAGNOSIS — I70211 Atherosclerosis of native arteries of extremities with intermittent claudication, right leg: Secondary | ICD-10-CM | POA: Diagnosis not present

## 2024-09-10 DIAGNOSIS — Z95828 Presence of other vascular implants and grafts: Secondary | ICD-10-CM | POA: Diagnosis not present

## 2024-09-10 HISTORY — PX: LOWER EXTREMITY INTERVENTION: CATH118252

## 2024-09-10 HISTORY — PX: ABDOMINAL AORTOGRAM: CATH118222

## 2024-09-10 HISTORY — PX: LOWER EXTREMITY ANGIOGRAPHY: CATH118251

## 2024-09-10 LAB — POCT I-STAT, CHEM 8
BUN: 21 mg/dL (ref 8–23)
Calcium, Ion: 1.22 mmol/L (ref 1.15–1.40)
Chloride: 109 mmol/L (ref 98–111)
Creatinine, Ser: 1.1 mg/dL (ref 0.61–1.24)
Glucose, Bld: 85 mg/dL (ref 70–99)
HCT: 46 % (ref 39.0–52.0)
Hemoglobin: 15.6 g/dL (ref 13.0–17.0)
Potassium: 3.8 mmol/L (ref 3.5–5.1)
Sodium: 144 mmol/L (ref 135–145)
TCO2: 26 mmol/L (ref 22–32)

## 2024-09-10 SURGERY — LOWER EXTREMITY ANGIOGRAPHY
Anesthesia: LOCAL | Laterality: Right

## 2024-09-10 MED ORDER — MIDAZOLAM HCL 2 MG/2ML IJ SOLN
INTRAMUSCULAR | Status: AC
  Filled 2024-09-10: qty 2

## 2024-09-10 MED ORDER — ASPIRIN 81 MG PO CHEW
CHEWABLE_TABLET | ORAL | Status: AC
  Filled 2024-09-10: qty 1

## 2024-09-10 MED ORDER — CLOPIDOGREL BISULFATE 75 MG PO TABS: 75.0000 mg | ORAL_TABLET | Freq: Every day | ORAL | Status: DC

## 2024-09-10 MED ORDER — SODIUM CHLORIDE 0.9 % IV SOLN: INTRAVENOUS | Status: DC

## 2024-09-10 MED ORDER — HEPARIN (PORCINE) IN NACL 1000-0.9 UT/500ML-% IV SOLN
INTRAVENOUS | Status: DC | PRN
  Administered 2024-09-10 (×2): 500 mL

## 2024-09-10 MED ORDER — LIDOCAINE HCL (PF) 1 % IJ SOLN
INTRAMUSCULAR | Status: DC | PRN
  Administered 2024-09-10: 18 mL

## 2024-09-10 MED ORDER — HEPARIN SODIUM (PORCINE) 1000 UNIT/ML IJ SOLN
INTRAMUSCULAR | Status: AC
  Filled 2024-09-10: qty 10

## 2024-09-10 MED ORDER — FENTANYL CITRATE (PF) 100 MCG/2ML IJ SOLN
INTRAMUSCULAR | Status: AC
  Filled 2024-09-10: qty 2

## 2024-09-10 MED ORDER — MIDAZOLAM HCL 2 MG/2ML IJ SOLN
INTRAMUSCULAR | Status: DC | PRN
  Administered 2024-09-10: 2 mg via INTRAVENOUS
  Administered 2024-09-10 (×2): 1 mg via INTRAVENOUS

## 2024-09-10 MED ORDER — IODIXANOL 320 MG/ML IV SOLN
INTRAVENOUS | Status: DC | PRN
  Administered 2024-09-10: 45 mL via INTRA_ARTERIAL

## 2024-09-10 MED ORDER — ACETAMINOPHEN 325 MG PO TABS: 650.0000 mg | ORAL_TABLET | ORAL | Status: DC | PRN

## 2024-09-10 MED ORDER — LIDOCAINE HCL (PF) 1 % IJ SOLN
INTRAMUSCULAR | Status: AC
  Filled 2024-09-10: qty 30

## 2024-09-10 MED ORDER — SODIUM CHLORIDE 0.9 % WEIGHT BASED INFUSION: 1.0000 mL/kg/h | INTRAVENOUS | Status: DC

## 2024-09-10 MED ORDER — ONDANSETRON HCL 4 MG/2ML IJ SOLN: 4.0000 mg | Freq: Four times a day (QID) | INTRAMUSCULAR | Status: DC | PRN

## 2024-09-10 MED ORDER — CLOPIDOGREL BISULFATE 75 MG PO TABS: 75.0000 mg | ORAL_TABLET | Freq: Every day | ORAL | 11 refills | Status: AC

## 2024-09-10 MED ORDER — CLOPIDOGREL BISULFATE 300 MG PO TABS
ORAL_TABLET | ORAL | Status: AC
  Filled 2024-09-10: qty 1

## 2024-09-10 MED ORDER — SODIUM CHLORIDE 0.9% FLUSH: 3.0000 mL | Freq: Two times a day (BID) | INTRAVENOUS | Status: DC

## 2024-09-10 MED ORDER — MORPHINE SULFATE (PF) 2 MG/ML IV SOLN: 2.0000 mg | INTRAVENOUS | Status: DC | PRN

## 2024-09-10 MED ORDER — SODIUM CHLORIDE 0.9% FLUSH: 3.0000 mL | INTRAVENOUS | Status: DC | PRN

## 2024-09-10 MED ORDER — FENTANYL CITRATE (PF) 100 MCG/2ML IJ SOLN
INTRAMUSCULAR | Status: DC | PRN
  Administered 2024-09-10: 25 ug via INTRAVENOUS
  Administered 2024-09-10: 50 ug via INTRAVENOUS
  Administered 2024-09-10: 25 ug via INTRAVENOUS

## 2024-09-10 MED ORDER — LABETALOL HCL 5 MG/ML IV SOLN: 10.0000 mg | INTRAVENOUS | Status: DC | PRN

## 2024-09-10 MED ORDER — ASPIRIN 81 MG PO TBEC: 81.0000 mg | DELAYED_RELEASE_TABLET | Freq: Every day | ORAL | Status: DC

## 2024-09-10 MED ORDER — HEPARIN SODIUM (PORCINE) 1000 UNIT/ML IJ SOLN
INTRAMUSCULAR | Status: DC | PRN
  Administered 2024-09-10: 7000 [IU] via INTRAVENOUS
  Administered 2024-09-10: 3000 [IU] via INTRAVENOUS

## 2024-09-10 MED ORDER — SODIUM CHLORIDE 0.9 % IV SOLN: 250.0000 mL | INTRAVENOUS | Status: DC | PRN

## 2024-09-10 SURGICAL SUPPLY — 21 items
BALLOON MUSTANG 5X60X135 (BALLOONS) IMPLANT
BALLOON STERLING OTW 3X60X150 (BALLOONS) IMPLANT
CATH AURYON ATHREC 1.7 (CATHETERS) IMPLANT
CATH OMNI FLUSH 5F 65CM (CATHETERS) IMPLANT
CATH QUICKCROSS SUPP .035X90CM (MICROCATHETER) IMPLANT
CATH RUBICON 018 135 (CATHETERS) IMPLANT
COVER DOME SNAP 22 D (MISCELLANEOUS) IMPLANT
DEVICE VASC CLSR CELT ART 6 (Vascular Products) IMPLANT
KIT ENCORE 26 ADVANTAGE (KITS) IMPLANT
KIT MICROPUNCTURE NIT STIFF (SHEATH) IMPLANT
KIT SINGLE USE MANIFOLD (KITS) IMPLANT
SET ATX-X65L (MISCELLANEOUS) IMPLANT
SHEATH CATAPULT 6FR 60 (SHEATH) IMPLANT
SHEATH PINNACLE 5F 10CM (SHEATH) IMPLANT
SHEATH PINNACLE 6F 10CM (SHEATH) IMPLANT
SHEATH PROBE COVER 6X72 (BAG) IMPLANT
STENT ELUVIA 6X60X130 (Permanent Stent) IMPLANT
TRAY PV CATH (CUSTOM PROCEDURE TRAY) IMPLANT
WIRE BENTSON .035X145CM (WIRE) IMPLANT
WIRE G V18X300CM (WIRE) IMPLANT
WIRE HI TORQ VERSACORE 300 (WIRE) IMPLANT

## 2024-09-10 NOTE — H&P (Signed)
   Patient name: Douglas Christensen MRN: [id number] DOB: December 31, 1950 Sex: male    HISTORY OF PRESENT ILLNESS:   Douglas Christensen is a 73 y.o. male who recently underwent angiography and intervention on the left leg.  At that time he was found to have progression of the disease on the right side and so he is back today for intervention  CURRENT MEDICATIONS:    Current Facility-Administered Medications  Medication Dose Route Frequency Provider Last Rate Last Admin   0.9 %  sodium chloride  infusion   Intravenous Continuous Mikaia Janvier, Gaile ORN, MD        REVIEW OF SYSTEMS:   [X]  denotes positive finding, [ ]  denotes negative finding Cardiac  Comments:  Chest pain or chest pressure:    Shortness of breath upon exertion:    Short of breath when lying flat:    Irregular heart rhythm:    Constitutional    Fever or chills:      PHYSICAL EXAM:   Vitals:   09/10/24 1139  BP: (!) 149/68  Pulse: (!) 57  Resp: 17  Temp: 98.1 F (36.7 C)  TempSrc: Oral  SpO2: 98%  Weight: 72.6 kg  Height: 5' 5 (1.651 m)    GENERAL: The patient is a well-nourished male, in no acute distress. The vital signs are documented above. CARDIOVASCULAR: There is a regular rate and rhythm. PULMONARY: Non-labored respirations   STUDIES:      MEDICAL ISSUES:   Plan for angiography via left femoral approach and intervention on the right leg as indicated.  All questions answered.  Malvina Serene CLORE, MD, FACS [facility] [phone] Pager [phone]

## 2024-09-10 NOTE — Progress Notes (Signed)
 Patient ambulated in the hall. Left groin site clean, dry, and intact. No hematoma or bleeding noted. Patient was assisted with getting dressed and site was rechecked, no bleeding or hematoma noted. Patient and daughter received discharge instructions. No concerns voiced at this time.

## 2024-09-10 NOTE — Discharge Instructions (Signed)
 Femoral Site Care This sheet gives you information about how to care for yourself after your procedure. Your health care provider may also give you more specific instructions. If you have problems or questions, contact your health care provider. What can I expect after the procedure?  After the procedure, it is common to have: Bruising that usually fades within 1-2 weeks. Tenderness at the site. Follow these instructions at home: Wound care Follow instructions from your health care provider about how to take care of your insertion site. Make sure you: Wash your hands with soap and water  before you change your bandage (dressing). If soap and water  are not available, use hand sanitizer. Remove your dressing as told by your health care provider. 24 hours Do not take baths, swim, or use a hot tub until your health care provider approves. You may shower 24-48 hours after the procedure or as told by your health care provider. Gently wash the site with plain soap and water . Pat the area dry with a clean towel. Do not rub the site. This may cause bleeding. Do not apply powder or lotion to the site. Keep the site clean and dry. Check your femoral site every day for signs of infection. Check for: Redness, swelling, or pain. Fluid or blood. Warmth. Pus or a bad smell. Activity For the first 2-3 days after your procedure, or as long as directed: Avoid climbing stairs as much as possible. Do not squat. Do not lift anything that is heavier than 10 lb (4.5 kg), or the limit that you are told, until your health care provider says that it is safe. For 5 days Rest as directed. Avoid sitting for a long time without moving. Get up to take short walks every 1-2 hours. Do not drive for 24 hours if you were given a medicine to help you relax (sedative). General instructions Take over-the-counter and prescription medicines only as told by your health care provider. Keep all follow-up visits as told by your  health care provider. This is important. Contact a health care provider if you have: A fever or chills. You have redness, swelling, or pain around your insertion site. Get help right away if: The catheter insertion area swells very fast. You pass out. You suddenly start to sweat or your skin gets clammy. The catheter insertion area is bleeding, and the bleeding does not stop when you hold steady pressure on the area. The area near or just beyond the catheter insertion site becomes pale, cool, tingly, or numb. These symptoms may represent a serious problem that is an emergency. Do not wait to see if the symptoms will go away. Get medical help right away. Call your local emergency services (911 in the [place] Do not drive yourself to the hospital. Summary After the procedure, it is common to have bruising that usually fades within 1-2 weeks. Check your femoral site every day for signs of infection. Do not lift anything that is heavier than 10 lb (4.5 kg), or the limit that you are told, until your health care provider says that it is safe. This information is not intended to replace advice given to you by your health care provider. Make sure you discuss any questions you have with your health care provider. Document Revised: 11/27/2017 Document Reviewed: 11/27/2017 Elsevier Patient Education  2020 [employer].

## 2024-09-10 NOTE — Op Note (Signed)
 Patient name: Douglas Christensen MRN: [id number] DOB: [birth year] Sex: male  09/10/2024 Pre-operative Diagnosis: Right leg claudication Post-operative diagnosis:  Same Surgeon:  [name] Procedure Performed:  1.  Ultrasound-guided access, left femoral artery  2.  Right leg angiogram  3.  Selective injection with cath in the right superficial femoral artery  4.  Stent, right superficial femoral/popliteal artery  5.  Laser atherectomy, right popliteal and anterior tibial artery  6.  Angioplasty, right popliteal and anterior tibial artery  7.  Conscious sedation, 65 minutes  8.  Closure device, Celt   Indications: This is a 72 year old gentleman who was found to have a high-grade stenosis distal to his right femoral-popliteal bypass graft on angiography a few weeks ago when looking at the left leg.  He is back today to treat this area  Procedure:  The patient was identified in the holding area and taken to room 8.  The patient was then placed supine on the table and prepped and draped in the usual sterile fashion.  A time out was called.  Conscious sedation was administered with the use of IV fentanyl  and Versed  under continuous physician and nurse monitoring.  Heart rate, blood pressure, and oxygen saturation were continuously monitored.  Total sedation time was 65 minutes.  Ultrasound was used to evaluate the left common femoral artery.  It was patent .  A digital ultrasound image was acquired.  A micropuncture needle was used to access the left common femoral artery under ultrasound guidance.  An 018 wire was advanced without resistance and a micropuncture sheath was placed.  The 018 wire was removed and a benson wire was placed.  The micropuncture sheath was exchanged for a 5 french sheath.  An omniflush catheter was advanced over the wire and used to cross the bifurcation.  The cath was placed in the right external iliac artery and right leg angiogram was performed. Findings:   Right Lower  Extremity: The right common femoral and profundofemoral artery are widely patent.  There is a right femoral to above-knee popliteal bypass graft that is widely patent.  There is a stent below the bypass graft in the above-knee popliteal artery that has a area of stenosis approximately 50%.  Distal to the stent there is a heavily calcified lesion of at least 70% stenosis.  There is also a lesion in the popliteal artery extending into the origin and first 2 cm of the anterior tibial artery that is heavily calcified and highly stenotic.  The stenosis is greater than 70% and there is 360 degrees of calcification.  The anterior tibial is a dominant runoff.   Intervention: After the above images were acquired the decision was made to proceed with intervention.  A 6 x 60 sheath was advanced into the right femoral-popliteal bypass graft.  Additional contrast images were performed in this location.  The patient was fully heparinized.  I advanced a versa core wire across the lesion and elected to stent the above-knee lesion with a 6 x 60 Eluvia stent that extended distal to the existing stent but landed at the top.  It was postdilated with a 5 mm balloon.  Completion imaging showed resolution of the stenosis.  Next I continued to evaluate the lesion in the popliteal artery at the origin of the anterior tibial, with additional injections through the sheath and the femoral-popliteal bypass graft.  Ultimately I felt that this was going to be a problem for keeping his bypass graft open and elected  to intervene.  I used a Rubicon catheter and a V-18 wire and was able to navigate the wire into the anterior tibial artery.  The 1.7 laser was used to perform atherectomy of the circumferentially calcified high-grade stenosis in the distal below-knee popliteal artery extending into the anterior tibial artery.  The laser went down to the bend in the anterior tibial, approximately 2 cm.  4 passes down the back were made.  I then  selected a 3 x 60 Sterling balloon and performed balloon angioplasty of the anterior tibial and popliteal artery.  The balloon was taken to 6 atm for 2 minutes.  Completion imaging revealed significant improvement in the stenosis in the anterior tibial artery.  This was less than 15%.  I was satisfied with these results.  There was no dissection.  I elected to stop the procedure.  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  This was treated using a 6 x 60 Eluvia  #2  High-grade heavily calcified stenosis in the anterior tibial artery extending back into the popliteal artery.  The anterior tibial is a dominant runoff.  I performed laser atherectomy using a 1.7 laser fiber and then balloon angioplasty using a 3 mm balloon   V. [name], M.D., [facility] [place] Office: [phone] Pager:  [phone]

## 2024-09-11 ENCOUNTER — Encounter (HOSPITAL_COMMUNITY): Payer: Self-pay | Admitting: Surgery

## 2024-09-17 ENCOUNTER — Other Ambulatory Visit: Payer: Self-pay | Admitting: Cardiovascular Disease

## 2024-09-20 NOTE — Telephone Encounter (Signed)
 For Review-Sig has changed since we last filled. Ok to fill

## 2024-09-23 MED ORDER — LOSARTAN POTASSIUM 50 MG PO TABS: ORAL_TABLET | ORAL | 3 refills | Status: AC

## 2024-09-23 NOTE — Telephone Encounter (Signed)
 Prescription sent to pharmacy with updated amount

## 2024-09-23 NOTE — Telephone Encounter (Signed)
 OK to refill

## 2024-09-23 NOTE — Addendum Note (Signed)
 Addended by: Tanaja Ganger L on: 09/23/2024 05:32 PM   Modules accepted: Orders

## 2024-10-10 ENCOUNTER — Other Ambulatory Visit: Payer: Self-pay | Admitting: Internal Medicine

## 2024-10-11 ENCOUNTER — Telehealth: Payer: Self-pay | Admitting: Cardiovascular Disease

## 2024-10-11 NOTE — Telephone Encounter (Signed)
 Pt states he received a voicemail asking what dosage losartan  he takes. Pt 75 mg, two tablets in the morning.  Please advise.

## 2024-11-05 ENCOUNTER — Other Ambulatory Visit: Payer: Self-pay | Admitting: Cardiovascular Disease

## 2024-11-25 ENCOUNTER — Other Ambulatory Visit: Payer: Self-pay

## 2024-11-25 ENCOUNTER — Encounter (HOSPITAL_COMMUNITY): Payer: Self-pay

## 2024-11-25 DIAGNOSIS — I70213 Atherosclerosis of native arteries of extremities with intermittent claudication, bilateral legs: Secondary | ICD-10-CM

## 2024-11-26 ENCOUNTER — Ambulatory Visit (INDEPENDENT_AMBULATORY_CARE_PROVIDER_SITE_OTHER)

## 2024-11-26 DIAGNOSIS — I442 Atrioventricular block, complete: Secondary | ICD-10-CM

## 2024-11-27 ENCOUNTER — Ambulatory Visit: Payer: Self-pay | Admitting: Cardiovascular Disease

## 2024-11-27 LAB — CUP PACEART REMOTE DEVICE CHECK
Battery Remaining Longevity: 87 mo
Battery Voltage: 2.98 V
Brady Statistic AP VP Percent: 56.56 %
Brady Statistic AP VS Percent: 0 %
Brady Statistic AS VP Percent: 36.97 %
Brady Statistic AS VS Percent: 6.47 %
Brady Statistic RA Percent Paced: 59.07 %
Brady Statistic RV Percent Paced: 93.53 %
Date Time Interrogation Session: 20251231001849
Implantable Lead Connection Status: 753985
Implantable Lead Connection Status: 753985
Implantable Lead Implant Date: 20201231
Implantable Lead Implant Date: 20201231
Implantable Lead Location: 753859
Implantable Lead Location: 753860
Implantable Lead Model: 3830
Implantable Lead Model: 5076
Implantable Pulse Generator Implant Date: 20201231
Lead Channel Impedance Value: 342 Ohm
Lead Channel Impedance Value: 437 Ohm
Lead Channel Impedance Value: 456 Ohm
Lead Channel Impedance Value: 646 Ohm
Lead Channel Pacing Threshold Amplitude: 0.5 V
Lead Channel Pacing Threshold Amplitude: 0.75 V
Lead Channel Pacing Threshold Pulse Width: 0.4 ms
Lead Channel Pacing Threshold Pulse Width: 0.4 ms
Lead Channel Sensing Intrinsic Amplitude: 19.75 mV
Lead Channel Sensing Intrinsic Amplitude: 19.75 mV
Lead Channel Sensing Intrinsic Amplitude: 5 mV
Lead Channel Sensing Intrinsic Amplitude: 5 mV
Lead Channel Setting Pacing Amplitude: 1.5 V
Lead Channel Setting Pacing Amplitude: 2.5 V
Lead Channel Setting Pacing Pulse Width: 0.4 ms
Lead Channel Setting Sensing Sensitivity: 1.2 mV
Zone Setting Status: 755011
Zone Setting Status: 755011

## 2024-11-27 NOTE — Progress Notes (Signed)
 Remote PPM Transmission

## 2024-12-23 ENCOUNTER — Ambulatory Visit (HOSPITAL_COMMUNITY)

## 2024-12-23 ENCOUNTER — Ambulatory Visit

## 2024-12-30 ENCOUNTER — Encounter: Payer: Self-pay | Admitting: Family

## 2025-02-03 ENCOUNTER — Ambulatory Visit (HOSPITAL_COMMUNITY)

## 2025-02-03 ENCOUNTER — Ambulatory Visit: Admitting: Surgery

## 2025-02-25 ENCOUNTER — Ambulatory Visit: Payer: Self-pay

## 2025-03-25 ENCOUNTER — Encounter: Admitting: Internal Medicine

## 2025-05-27 ENCOUNTER — Ambulatory Visit: Payer: Self-pay

## 2025-06-26 ENCOUNTER — Ambulatory Visit

## 2025-08-26 ENCOUNTER — Ambulatory Visit: Payer: Self-pay

## 2025-11-25 ENCOUNTER — Ambulatory Visit: Payer: Self-pay

## 2026-02-24 ENCOUNTER — Ambulatory Visit: Payer: Self-pay
